# Patient Record
Sex: Male | Born: 1970 | Race: White | Hispanic: No | Marital: Single | Smoking: Current some day smoker
Health system: Southern US, Community
[De-identification: ages and names within clinical notes are randomized; demographics above are authoritative.]

## PROBLEM LIST (undated history)

## (undated) DIAGNOSIS — E785 Hyperlipidemia, unspecified: Secondary | ICD-10-CM

## (undated) DIAGNOSIS — E119 Type 2 diabetes mellitus without complications: Secondary | ICD-10-CM

## (undated) DIAGNOSIS — K2210 Ulcer of esophagus without bleeding: Secondary | ICD-10-CM

## (undated) DIAGNOSIS — F329 Major depressive disorder, single episode, unspecified: Secondary | ICD-10-CM

## (undated) DIAGNOSIS — F419 Anxiety disorder, unspecified: Secondary | ICD-10-CM

## (undated) DIAGNOSIS — K259 Gastric ulcer, unspecified as acute or chronic, without hemorrhage or perforation: Secondary | ICD-10-CM

## (undated) DIAGNOSIS — I10 Essential (primary) hypertension: Secondary | ICD-10-CM

## (undated) DIAGNOSIS — M1612 Unilateral primary osteoarthritis, left hip: Secondary | ICD-10-CM

## (undated) DIAGNOSIS — R7303 Prediabetes: Secondary | ICD-10-CM

## (undated) DIAGNOSIS — F32A Depression, unspecified: Secondary | ICD-10-CM

## (undated) DIAGNOSIS — R0600 Dyspnea, unspecified: Secondary | ICD-10-CM

## (undated) DIAGNOSIS — D689 Coagulation defect, unspecified: Secondary | ICD-10-CM

## (undated) DIAGNOSIS — Z9289 Personal history of other medical treatment: Secondary | ICD-10-CM

## (undated) DIAGNOSIS — K219 Gastro-esophageal reflux disease without esophagitis: Secondary | ICD-10-CM

## (undated) DIAGNOSIS — G4730 Sleep apnea, unspecified: Secondary | ICD-10-CM

## (undated) DIAGNOSIS — K254 Chronic or unspecified gastric ulcer with hemorrhage: Secondary | ICD-10-CM

## (undated) DIAGNOSIS — I639 Cerebral infarction, unspecified: Secondary | ICD-10-CM

## (undated) DIAGNOSIS — R351 Nocturia: Secondary | ICD-10-CM

## (undated) DIAGNOSIS — K644 Residual hemorrhoidal skin tags: Secondary | ICD-10-CM

## (undated) DIAGNOSIS — M2550 Pain in unspecified joint: Secondary | ICD-10-CM

## (undated) DIAGNOSIS — R42 Dizziness and giddiness: Secondary | ICD-10-CM

## (undated) DIAGNOSIS — K922 Gastrointestinal hemorrhage, unspecified: Secondary | ICD-10-CM

## (undated) DIAGNOSIS — J189 Pneumonia, unspecified organism: Secondary | ICD-10-CM

## (undated) DIAGNOSIS — K449 Diaphragmatic hernia without obstruction or gangrene: Secondary | ICD-10-CM

## (undated) DIAGNOSIS — K432 Incisional hernia without obstruction or gangrene: Secondary | ICD-10-CM

## (undated) DIAGNOSIS — Z86718 Personal history of other venous thrombosis and embolism: Secondary | ICD-10-CM

## (undated) DIAGNOSIS — K439 Ventral hernia without obstruction or gangrene: Secondary | ICD-10-CM

## (undated) DIAGNOSIS — K635 Polyp of colon: Secondary | ICD-10-CM

## (undated) DIAGNOSIS — K5900 Constipation, unspecified: Secondary | ICD-10-CM

## (undated) HISTORY — DX: Coagulation defect, unspecified: D68.9

## (undated) HISTORY — PX: UPPER GASTROINTESTINAL ENDOSCOPY: SHX188

## (undated) HISTORY — DX: Ventral hernia without obstruction or gangrene: K43.9

## (undated) HISTORY — DX: Hyperlipidemia, unspecified: E78.5

## (undated) HISTORY — DX: Essential (primary) hypertension: I10

## (undated) HISTORY — DX: Ulcer of esophagus without bleeding: K22.10

## (undated) HISTORY — DX: Diaphragmatic hernia without obstruction or gangrene: K44.9

## (undated) HISTORY — PX: POLYPECTOMY: SHX149

## (undated) HISTORY — PX: NISSEN FUNDOPLICATION: SHX2091

## (undated) HISTORY — DX: Sleep apnea, unspecified: G47.30

## (undated) HISTORY — DX: Polyp of colon: K63.5

## (undated) HISTORY — PX: COLONOSCOPY: SHX174

## (undated) HISTORY — PX: ESOPHAGOGASTRODUODENOSCOPY: SHX1529

---

## 1999-10-24 ENCOUNTER — Emergency Department (HOSPITAL_COMMUNITY): Admission: EM | Admit: 1999-10-24 | Discharge: 1999-10-24 | Payer: Self-pay | Admitting: Emergency Medicine

## 1999-10-25 ENCOUNTER — Encounter: Payer: Self-pay | Admitting: Emergency Medicine

## 2006-05-20 ENCOUNTER — Emergency Department: Payer: Self-pay | Admitting: Emergency Medicine

## 2010-02-02 DIAGNOSIS — Z9289 Personal history of other medical treatment: Secondary | ICD-10-CM

## 2010-02-02 DIAGNOSIS — Z86718 Personal history of other venous thrombosis and embolism: Secondary | ICD-10-CM

## 2010-02-02 HISTORY — DX: Personal history of other medical treatment: Z92.89

## 2010-02-02 HISTORY — DX: Personal history of other venous thrombosis and embolism: Z86.718

## 2010-09-28 ENCOUNTER — Inpatient Hospital Stay (HOSPITAL_COMMUNITY)
Admission: EM | Admit: 2010-09-28 | Discharge: 2010-10-01 | DRG: 378 | Disposition: A | Discharge status: Home / Self Care | Payer: Self-pay | Attending: Internal Medicine | Admitting: Internal Medicine

## 2010-09-28 ENCOUNTER — Encounter: Payer: Self-pay | Admitting: Internal Medicine

## 2010-09-28 ENCOUNTER — Emergency Department (HOSPITAL_COMMUNITY): Payer: Self-pay

## 2010-09-28 DIAGNOSIS — K2210 Ulcer of esophagus without bleeding: Secondary | ICD-10-CM | POA: Diagnosis present

## 2010-09-28 DIAGNOSIS — Z791 Long term (current) use of non-steroidal anti-inflammatories (NSAID): Secondary | ICD-10-CM

## 2010-09-28 DIAGNOSIS — R55 Syncope and collapse: Secondary | ICD-10-CM | POA: Diagnosis present

## 2010-09-28 DIAGNOSIS — K449 Diaphragmatic hernia without obstruction or gangrene: Secondary | ICD-10-CM | POA: Diagnosis present

## 2010-09-28 DIAGNOSIS — K254 Chronic or unspecified gastric ulcer with hemorrhage: Principal | ICD-10-CM | POA: Diagnosis present

## 2010-09-28 DIAGNOSIS — K922 Gastrointestinal hemorrhage, unspecified: Secondary | ICD-10-CM

## 2010-09-28 DIAGNOSIS — K219 Gastro-esophageal reflux disease without esophagitis: Secondary | ICD-10-CM | POA: Diagnosis present

## 2010-09-28 DIAGNOSIS — D500 Iron deficiency anemia secondary to blood loss (chronic): Secondary | ICD-10-CM | POA: Diagnosis present

## 2010-09-28 LAB — COMPREHENSIVE METABOLIC PANEL
ALT: 16 U/L (ref 0–53)
AST: 15 U/L (ref 0–37)
Albumin: 3 g/dL — ABNORMAL LOW (ref 3.5–5.2)
Alkaline Phosphatase: 31 U/L — ABNORMAL LOW (ref 39–117)
BUN: 25 mg/dL — ABNORMAL HIGH (ref 6–23)
CO2: 24 mEq/L (ref 19–32)
Calcium: 7.6 mg/dL — ABNORMAL LOW (ref 8.4–10.5)
Chloride: 110 mEq/L (ref 96–112)
Creatinine, Ser: 0.82 mg/dL (ref 0.50–1.35)
GFR calc Af Amer: >60 mL/min (ref >60)
GFR calc non Af Amer: >60 mL/min (ref >60)
Glucose, Bld: 92 mg/dL (ref 70–99)
Potassium: 4.4 mEq/L (ref 3.5–5.1)
Sodium: 139 mEq/L (ref 135–145)
Total Bilirubin: 0.1 mg/dL — ABNORMAL LOW (ref 0.3–1.2)
Total Protein: 4.9 g/dL — ABNORMAL LOW (ref 6.0–8.3)

## 2010-09-28 LAB — CK TOTAL AND CKMB (NOT AT ARMC)
CK, MB: 4.5 ng/mL — ABNORMAL HIGH (ref 0.3–4.0)
Relative Index: 2.3 (ref 0.0–2.5)
Total CK: 199 U/L (ref 7–232)

## 2010-09-28 LAB — PROTIME-INR
INR: 1.13 (ref 0.00–1.49)
Prothrombin Time: 14.7 seconds (ref 11.6–15.2)

## 2010-09-28 LAB — OCCULT BLOOD, POC DEVICE: Fecal Occult Bld: POSITIVE

## 2010-09-28 LAB — DIFFERENTIAL
Basophils Absolute: 0 10*3/uL (ref 0.0–0.1)
Basophils Relative: 0 % (ref 0–1)
Eosinophils Absolute: 0.1 10*3/uL (ref 0.0–0.7)
Eosinophils Relative: 1 % (ref 0–5)
Lymphocytes Relative: 12 % (ref 12–46)
Lymphs Abs: 1.9 10*3/uL (ref 0.7–4.0)
Monocytes Absolute: 0.6 10*3/uL (ref 0.1–1.0)
Monocytes Relative: 4 % (ref 3–12)
Neutro Abs: 13 10*3/uL — ABNORMAL HIGH (ref 1.7–7.7)
Neutrophils Relative %: 83 % — ABNORMAL HIGH (ref 43–77)

## 2010-09-28 LAB — POCT I-STAT, CHEM 8
BUN: 26 mg/dL — ABNORMAL HIGH (ref 6–23)
Calcium, Ion: 1.1 mmol/L — ABNORMAL LOW (ref 1.12–1.32)
Chloride: 110 mEq/L (ref 96–112)
Creatinine, Ser: 0.9 mg/dL (ref 0.50–1.35)
Glucose, Bld: 88 mg/dL (ref 70–99)
HCT: 26 % — ABNORMAL LOW (ref 39.0–52.0)
Hemoglobin: 8.8 g/dL — ABNORMAL LOW (ref 13.0–17.0)
Potassium: 4.3 mEq/L (ref 3.5–5.1)
Sodium: 141 mEq/L (ref 135–145)
TCO2: 22 mmol/L (ref 0–100)

## 2010-09-28 LAB — CBC
HCT: 26.8 % — ABNORMAL LOW (ref 39.0–52.0)
Hemoglobin: 9.1 g/dL — ABNORMAL LOW (ref 13.0–17.0)
MCH: 30.4 pg (ref 26.0–34.0)
MCHC: 34 g/dL (ref 30.0–36.0)
MCV: 89.6 fL (ref 78.0–100.0)
Platelets: 241 10*3/uL (ref 150–400)
RBC: 2.99 MIL/uL — ABNORMAL LOW (ref 4.22–5.81)
RDW: 13.2 % (ref 11.5–15.5)
WBC: 15.6 10*3/uL — ABNORMAL HIGH (ref 4.0–10.5)

## 2010-09-28 LAB — FOLATE: Folate: 15.5 ng/mL

## 2010-09-28 LAB — BASIC METABOLIC PANEL
Calcium: 7.7 mg/dL — ABNORMAL LOW (ref 8.4–10.5)
Creatinine, Ser: 0.79 mg/dL (ref 0.50–1.35)
GFR calc Af Amer: >60 mL/min (ref >60)
GFR calc non Af Amer: >60 mL/min (ref >60)
Sodium: 140 mEq/L (ref 135–145)

## 2010-09-28 LAB — IRON AND TIBC
Iron: 85 ug/dL (ref 42–135)
Saturation Ratios: 40 % (ref 20–55)
TIBC: 210 ug/dL — ABNORMAL LOW (ref 215–435)
UIBC: 125 ug/dL

## 2010-09-28 LAB — ABO/RH: ABO/RH(D): A POS

## 2010-09-28 LAB — VITAMIN B12: Vitamin B-12: 806 pg/mL (ref 211–911)

## 2010-09-28 LAB — FERRITIN: Ferritin: 44 ng/mL (ref 22–322)

## 2010-09-28 NOTE — H&P (Signed)
Hospital Admission Note Date: 09/28/2010  Patient name: [name] Medical record number: [id number] Date of birth: [birth year] Age: 40 y.o. Gender: male PCP: No PCP  Medical Service:  Medicine Teaching Service B2  Attending physician:   Dr. Blanch Media   Resident [phone]):   Dr. Bethel Born  Pager:  [phone] Resident (R1):   Dr. Blanca Friend  Pager:  [phone]  Chief Complaint: Hematemesis/Melena/Dizzyness  History of Present Illness: Mr. Bogdon is a 40 year old with a history of GERD s/p surgery for GERD (likely nissen fundoplication) who presents after experiencing SOB and lightheadedness last night, waking up weak and dizzy this morning and subsequently passing out on the couch, and hematemesis when he awoke from passing out as well as again in the ambulance coming to the ED.  He does report some melena yesterday as well.  When he was on the couch this morning his sister in law reports that he would "come in and out" of consciousness. Before late last night, he felt pretty much like his normal self, as he played in a softball game yesterday.  He has had chills and diaphoresis as well.  He has a history of GERD and underwent surgery for this (likely Nissen fundoplication) many years ago.  He still takes about 5 tums or rolaids per day.  He takes 400mg  ibuprofen about every other day for aches and pains.  No CP or abdominal pain, but his abdomen is tender he reports.  No headache, urinary symptoms, constipation, diarrhea, rash, recent weight loss, or fevers.    Meds: Ibuprofen 400mg  every other day on average for aches and pains Tums/Rolaids up to five per day  Allergies: NKDA   Past Medical History: GERD Joint Pains   Past Surgical History: "Esophageal flap for reflux" likely Nissen Fundoplication   Family History: Diabetes Mellitus, Heart Disease No MIs No cancer   Social History: Very little alcohol use socially No smoking No drug use. At baseline can walk  large distances easily.   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  Head: normocephalic and atraumatic.  Eyes: vision grossly intact, pupils equal, pupils round, pupils reactive to light, no injection and anicteric.  Mouth: pharynx pink and moist, no erythema, and no exudates.  Neck: supple, full ROM, no thyromegaly, no JVD, and no carotid bruits.  Lungs: normal respiratory effort, no accessory muscle use, normal breath sounds, no crackles, and no wheezes. Heart: normal rate, regular rhythm, no murmur, no gallop, and no rub.  Abdomen: soft, normal bowel sounds, no distention.  Moderate tenderness in central and lower quadrants.  No epigastric tenderness.  No guarding, no rebound tenderness. Msk: no joint swelling, no joint warmth, and no redness over joints.  Pulses: 2+ DP/PT pulses bilaterally Extremities: No cyanosis, clubbing, edema Neurologic: alert & oriented X3, cranial nerves II-XII intact, strength normal in all extremities, sensation intact to light touch.  Gait not tested due to dizzyness. Skin: turgor normal and no rashes.  Psych: Oriented X3, memory intact for recent and remote, normally interactive, good eye contact, mildly anxious appearing, and not depressed appearing.  Lab results: Basic Metabolic Panel: Recent Labs  [facility] 09/28/10 1420 09/28/10 1346   NA 141 139   K 4.3 4.4   CL 110 110   CO2 -- 24   GLUCOSE 88 92   BUN 26* 25*   CREATININE 0.90 0.82   CALCIUM -- 7.6*   MG -- --   PHOS -- --   Liver Function Tests: Recent Labs  [facility] 09/28/10 1346   AST 15   ALT 16   ALKPHOS 31*    BILITOT 0.1*   PROT 4.9*   ALBUMIN 3.0*   CBC: Recent Labs  [facility] 09/28/10 1420 09/28/10 1346   WBC -- 15.6*   NEUTROABS -- 13.0*   HGB 8.8* 9.1*   HCT 26.0* 26.8*   MCV -- 89.6   PLT -- 241     Imaging results:  Dg Chest Portable 1 View  09/28/2010  *RADIOLOGY REPORT*  Clinical Data: GI bleed.  PORTABLE CHEST - 1 VIEW  Comparison: No comparison studies available.  Findings: 1425 hours.  Lung volumes are low. The cardiopericardial silhouette is enlarged. The lungs are clear without focal infiltrate, edema, pneumothorax or pleural effusion. Imaged bony structures of the thorax are intact. Telemetry leads overlie the chest.  IMPRESSION: Low volume film without acute cardiopulmonary process.  Original Report Authenticated By: ERIC A. MANSELL, M.D.    Other results: EKG: Sinus rhythm.  Normal axis.  Possible Q wave in III, aVF.  So ST changes. No abnormal T wave inversions.    Assessment & Plan by Problem:  40 y/o M with h/o GERD s/p ?Nissen Fundoplication and nearly daily ibuprofen use comes to the ED for hemetemisis X2, melena for 2 days, and dizziness. He is hemoccult positive, has significant orthostasis and Hb of 9 (no baseline).   1. GI bleed: Seems to be upper GI bleed based on history. GERD and ibuprofen use points towards a Peptic ulcer disease. Vitals stable.  - 2 large bore IVs, telemetry monitoring, frequent vitals, CBC q4, Pt/INR, PTT  - IV fluids resuscitation with 1L bolus (in addition to 1 L given in ED) and NS at 200 cc/hr  - IV protonix 40 mg bid. Change to drip if Hb drops significantly or further episode of hemetemesis.  - Check for H-pylori serology  - GI consult for possible endoscopy to look for the source. Will keep on clear liquid diet for now and npo after midnight.  - will cycle cardiac enzymes and repeat EKG in AM to r/o ACS from blood loss.  - Transfuse with 1 unit PRBC.  Repeat transfusion if Hb <8  - morphine for arthritis pain.  - SCD for DVT PX      R2/3______________________________      R1________________________________  ATTENDING: I performed and/or observed a history and physical examination of the patient.  I discussed the case with the residents as noted and reviewed the residents' notes.  I agree with the findings and plan--please refer to the attending physician note for more details.  Signature________________________________  Printed Name_____________________________

## 2010-09-29 DIAGNOSIS — K922 Gastrointestinal hemorrhage, unspecified: Secondary | ICD-10-CM

## 2010-09-29 DIAGNOSIS — K254 Chronic or unspecified gastric ulcer with hemorrhage: Secondary | ICD-10-CM | POA: Insufficient documentation

## 2010-09-29 HISTORY — DX: Chronic or unspecified gastric ulcer with hemorrhage: K25.4

## 2010-09-29 LAB — CBC
HCT: 19.4 % — ABNORMAL LOW (ref 39.0–52.0)
HCT: 19.4 % — ABNORMAL LOW (ref 39.0–52.0)
HCT: 20 % — ABNORMAL LOW (ref 39.0–52.0)
Hemoglobin: 7.2 g/dL — ABNORMAL LOW (ref 13.0–17.0)
Hemoglobin: 6.7 g/dL — CL (ref 13.0–17.0)
Hemoglobin: 7 g/dL — ABNORMAL LOW (ref 13.0–17.0)
Hemoglobin: 7 g/dL — ABNORMAL LOW (ref 13.0–17.0)
MCH: 30.3 pg (ref 26.0–34.0)
MCH: 30.6 pg (ref 26.0–34.0)
MCH: 30.2 pg (ref 26.0–34.0)
MCH: 30.3 pg (ref 26.0–34.0)
MCHC: 34.1 g/dL (ref 30.0–36.0)
MCHC: 35.1 g/dL (ref 30.0–36.0)
MCHC: 35 g/dL (ref 30.0–36.0)
MCHC: 35 g/dL (ref 30.0–36.0)
MCV: 86.2 fL (ref 78.0–100.0)
MCV: 86.6 fL (ref 78.0–100.0)
MCV: 87.7 fL (ref 78.0–100.0)
MCV: 88.6 fL (ref 78.0–100.0)
MCV: 89 fL (ref 78.0–100.0)
Platelets: 204 10*3/uL (ref 150–400)
Platelets: 230 10*3/uL (ref 150–400)
Platelets: 236 10*3/uL (ref 150–400)
RBC: 2.19 MIL/uL — ABNORMAL LOW (ref 4.22–5.81)
RBC: 2.69 MIL/uL — ABNORMAL LOW (ref 4.22–5.81)
RDW: 14 % (ref 11.5–15.5)
RDW: 14.2 % (ref 11.5–15.5)
RDW: 14.3 % (ref 11.5–15.5)
RDW: 14.3 % (ref 11.5–15.5)
RDW: 15.2 % (ref 11.5–15.5)
WBC: 10 10*3/uL (ref 4.0–10.5)
WBC: 13.5 10*3/uL — ABNORMAL HIGH (ref 4.0–10.5)

## 2010-09-29 LAB — CARDIAC PANEL(CRET KIN+CKTOT+MB+TROPI)
Total CK: 176 U/L (ref 7–232)
Troponin I: <0.3 ng/mL (ref <0.30)

## 2010-09-29 LAB — GLUCOSE, CAPILLARY: Glucose-Capillary: 124 mg/dL — ABNORMAL HIGH (ref 70–99)

## 2010-09-30 LAB — CBC
HCT: 22.4 % — ABNORMAL LOW (ref 39.0–52.0)
HCT: 21.4 % — ABNORMAL LOW (ref 39.0–52.0)
HCT: 22.9 % — ABNORMAL LOW (ref 39.0–52.0)
Hemoglobin: 7.9 g/dL — ABNORMAL LOW (ref 13.0–17.0)
Hemoglobin: 7.9 g/dL — ABNORMAL LOW (ref 13.0–17.0)
MCH: 30.1 pg (ref 26.0–34.0)
MCH: 30.4 pg (ref 26.0–34.0)
MCH: 30.5 pg (ref 26.0–34.0)
MCHC: 34.9 g/dL (ref 30.0–36.0)
MCHC: 35.3 g/dL (ref 30.0–36.0)
MCHC: 35.6 g/dL (ref 30.0–36.0)
MCV: 86.5 fL (ref 78.0–100.0)
MCV: 85.9 fL (ref 78.0–100.0)
Platelets: 175 10*3/uL (ref 150–400)
Platelets: 183 10*3/uL (ref 150–400)
RBC: 2.59 MIL/uL — ABNORMAL LOW (ref 4.22–5.81)
RBC: 2.49 MIL/uL — ABNORMAL LOW (ref 4.22–5.81)
RDW: 14.9 % (ref 11.5–15.5)
RDW: 14.9 % (ref 11.5–15.5)
RDW: 15 % (ref 11.5–15.5)
WBC: 10.4 10*3/uL (ref 4.0–10.5)
WBC: 8.5 10*3/uL (ref 4.0–10.5)

## 2010-09-30 LAB — BASIC METABOLIC PANEL
BUN: 12 mg/dL (ref 6–23)
CO2: 25 mEq/L (ref 19–32)
Calcium: 7.9 mg/dL — ABNORMAL LOW (ref 8.4–10.5)
Chloride: 113 mEq/L — ABNORMAL HIGH (ref 96–112)
Creatinine, Ser: 0.77 mg/dL (ref 0.50–1.35)
GFR calc non Af Amer: >60 mL/min (ref >60)
Glucose, Bld: 92 mg/dL (ref 70–99)
Sodium: 141 mEq/L (ref 135–145)

## 2010-10-01 LAB — CBC
MCH: 30 pg (ref 26.0–34.0)
MCHC: 34.4 g/dL (ref 30.0–36.0)
Platelets: 205 10*3/uL (ref 150–400)
RBC: 2.47 MIL/uL — ABNORMAL LOW (ref 4.22–5.81)

## 2010-10-01 LAB — BASIC METABOLIC PANEL
CO2: 28 mEq/L (ref 19–32)
Calcium: 8 mg/dL — ABNORMAL LOW (ref 8.4–10.5)
Chloride: 109 mEq/L (ref 96–112)
GFR calc Af Amer: >60 mL/min (ref >60)
GFR calc non Af Amer: >60 mL/min (ref >60)
Glucose, Bld: 137 mg/dL — ABNORMAL HIGH (ref 70–99)
Potassium: 3.5 mEq/L (ref 3.5–5.1)
Sodium: 140 mEq/L (ref 135–145)

## 2010-10-02 ENCOUNTER — Inpatient Hospital Stay (HOSPITAL_COMMUNITY)
Admission: EM | Admit: 2010-10-02 | Discharge: 2010-10-11 | DRG: 327 | Disposition: A | Discharge status: Home / Self Care | Payer: Self-pay | Attending: Internal Medicine | Admitting: Internal Medicine

## 2010-10-02 ENCOUNTER — Encounter: Payer: Self-pay | Admitting: Internal Medicine

## 2010-10-02 DIAGNOSIS — E876 Hypokalemia: Secondary | ICD-10-CM | POA: Diagnosis present

## 2010-10-02 DIAGNOSIS — L270 Generalized skin eruption due to drugs and medicaments taken internally: Secondary | ICD-10-CM | POA: Diagnosis present

## 2010-10-02 DIAGNOSIS — K219 Gastro-esophageal reflux disease without esophagitis: Secondary | ICD-10-CM | POA: Diagnosis present

## 2010-10-02 DIAGNOSIS — K922 Gastrointestinal hemorrhage, unspecified: Secondary | ICD-10-CM

## 2010-10-02 DIAGNOSIS — Z791 Long term (current) use of non-steroidal anti-inflammatories (NSAID): Secondary | ICD-10-CM

## 2010-10-02 DIAGNOSIS — M1990 Unspecified osteoarthritis, unspecified site: Secondary | ICD-10-CM | POA: Diagnosis present

## 2010-10-02 DIAGNOSIS — T40605A Adverse effect of unspecified narcotics, initial encounter: Secondary | ICD-10-CM | POA: Diagnosis present

## 2010-10-02 DIAGNOSIS — D62 Acute posthemorrhagic anemia: Secondary | ICD-10-CM | POA: Diagnosis present

## 2010-10-02 DIAGNOSIS — K254 Chronic or unspecified gastric ulcer with hemorrhage: Principal | ICD-10-CM | POA: Diagnosis present

## 2010-10-02 LAB — CBC
HCT: 23.6 % — ABNORMAL LOW (ref 39.0–52.0)
Hemoglobin: 8.2 g/dL — ABNORMAL LOW (ref 13.0–17.0)
MCH: 30.4 pg (ref 26.0–34.0)
MCHC: 33.9 g/dL (ref 30.0–36.0)
MCV: 89.7 fL (ref 78.0–100.0)
Platelets: 285 10*3/uL (ref 150–400)
RBC: 2.72 MIL/uL — ABNORMAL LOW (ref 4.22–5.81)
RDW: 15.1 % (ref 11.5–15.5)
WBC: 13.2 10*3/uL — ABNORMAL HIGH (ref 4.0–10.5)

## 2010-10-02 LAB — TYPE AND SCREEN
ABO/RH(D): A POS
Antibody Screen: NEGATIVE
Unit division: 0
Unit division: 0
Unit division: 0

## 2010-10-02 LAB — PROTIME-INR
INR: 1.01 (ref 0.00–1.49)
Prothrombin Time: 13.5 seconds (ref 11.6–15.2)

## 2010-10-02 LAB — BASIC METABOLIC PANEL
Calcium: 8.9 mg/dL (ref 8.4–10.5)
Creatinine, Ser: 0.69 mg/dL (ref 0.50–1.35)
GFR calc non Af Amer: >60 mL/min (ref >60)
Sodium: 140 mEq/L (ref 135–145)

## 2010-10-02 LAB — DIFFERENTIAL
Basophils Absolute: 0 10*3/uL (ref 0.0–0.1)
Basophils Relative: 0 % (ref 0–1)
Neutro Abs: 10.7 10*3/uL — ABNORMAL HIGH (ref 1.7–7.7)
Neutrophils Relative %: 81 % — ABNORMAL HIGH (ref 43–77)

## 2010-10-02 LAB — OCCULT BLOOD, POC DEVICE: Fecal Occult Bld: POSITIVE

## 2010-10-02 NOTE — H&P (Signed)
Hospital Admission Note  Date: 10/02/2010  Patient name: [name] Medical record number: [id number]  Date of birth: 05-19-1970 Age: 40 y.o. Gender: male  PCP: Dr. Bethel Born Medical Service: Medicine Teaching Service B2   Attending physician: Dr. Blanch Media   Resident [phone]): Dr. Bethel Born Pager: [phone]   Resident (R1): Dr. Blanca Friend Pager: [phone]   Chief Complaint: Hematemesis/Melena/Dizzyness   History of Present Illness:  [name] is a 40 year old with a history of GERD s/p surgery for GERD (possible nissen fundoplication?) ~20 years ago who was admitted to our service on 09/28/10 and found to have a bleeding gastric ulcer on endoscopy.  Prior to endoscopy his hgb had been falling and he required a total of five units PRBCs during the admission in addition to a lot of IVF.  GI injected the ulcer with epinephrine and clipped the ulcer, and his Hgb stabilized.  He did well and was discharged yesterday.  His Hgb was 7.5 on discharge.  He reported that his melena resolved before discharge.  He returned to the ED today after two episodes of melena at home.  The second was this morning, following which he experienced diaphoresis and felt very lightheaded.  He had similar symptoms before his first admission, and he is quite concerned that he may be rebleeding.  No hematemesis.  No bright red blood in his stools.  No fevers or chills, no CP, no SOB, no headache, no urinary symptoms, no constipation, no diarrhea, and no rash.  He has had a mild increase in his lower abdominal pain present during last admission today as well.   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  Head: normocephalic and atraumatic.  Eyes: vision grossly intact, pupils equal, pupils round, pupils reactive to light, no injection and anicteric.  Mouth: pharynx pink and moist, no erythema, and no exudates.  Neck: supple, full ROM, no thyromegaly, no JVD, and no carotid bruits.  Lungs: normal respiratory effort, no accessory muscle use, normal breath sounds, no crackles, and no wheezes. Heart: normal rate, regular rhythm, no murmur, no gallop, and no rub.  Abdomen: soft, normal bowel sounds, no distention. Moderate tenderness in central and lower quadrants to light/medium palpation. Moderate epigastric tenderness to deep palpation only. No guarding, no rebound tenderness. Msk: no joint swelling, no joint warmth, and no redness over joints.  Pulses: 2+ DP/PT pulses bilaterally Extremities: No cyanosis, clubbing, edema Neurologic: alert & oriented X3, cranial nerves II-XII intact, strength normal in all extremities, sensation intact to light touch. Gait not tested  due to dizzyness. Skin: turgor normal and no rashes.  Psych: Oriented X3, memory intact for recent and remote, normally interactive, good eye contact, mildly anxious appearing, and not depressed appearing.   Lab results:   BMET    Component Value Date/Time   NA 140 10/02/2010 1240   K 4.1 10/02/2010 1240   CL 107 10/02/2010 1240   CO2 25 10/02/2010 1240   GLUCOSE 109* 10/02/2010 1240   BUN 10 10/02/2010 1240   CREATININE 0.69 10/02/2010 1240   CALCIUM 8.9 10/02/2010  1240   GFRNONAA >60 10/02/2010 1240   GFRAA >60 10/02/2010 1240   CBC:     Component Value Date/Time   WBC 13.2* 10/02/2010 1240   RBC 2.72* 10/02/2010 1240   HGB 8.2* 10/02/2010 1240   HCT 24.2* 10/02/2010 1240   PLT 231 10/02/2010 1240   MCV 89.0 10/02/2010 1240   MCH 30.1 10/02/2010 1240   MCHC 33.9 10/02/2010 1240   RDW 15.4 10/02/2010 1240   LYMPHSABS 1.6 10/02/2010 1240   MONOABS 0.8 10/02/2010 1240   EOSABS 0.2 10/02/2010 1240   BASOSABS 0.0 10/02/2010 1240   PT: 13.5 INR: 1.01 PTT: 27  FOBT: Positive   Assessment & Plan by Problem:  40 y/o M with h/o GERD and hiatal hernia s/p "esophageal wrap" surgery now likely failed who was admitted on 09/28/10 for a bleeding gastric ulcer likely caused by nearly daily ibuprofen use who comes back to the ED for melena since discharge after melena had resolved before discharge, diaphoresis, and dizziness. He is mildly orthostatic in the ED and is very concerned.  1. GI bleed: It is most likely that he has not begun bleeding again given no Hgb drop, no hematemesis, and no hematochezia.  Some melena is to be expected in the days following a GI bleed.  However, his history of melena restarting after stopping and increased dizzyness/diaphoresis are concerning.  His symptoms are probably a combination of his low hemoglobin and anxiety, but we will admit him to make sure he is not still bleeding.  - 2 large bore IVs, telemetry monitoring, CBC BID - Fall precautions - IV fluids resuscitation with 2L bolus and NS at 150 cc/hr  - IV protonix 40 mg bid. Change to drip if Hb drops significantly or he has hematemesis  - Will keep on clear liquid diet for now - 12 lead EKG - Type and Screen 4 units, transfuse with 1 unit PRBC. Repeat transfusion if Hb <8 since he is symptomatic on admission.  - Tylenol PRN for arthritis pain.  - Iron 325mg  PO BID - SCD for DVT PX     R2/3______________________________    R1________________________________     ATTENDING: I performed and/or observed a history and physical examination of the patient. I discussed the case with the residents as noted and reviewed the residents' notes. I agree with the findings and plan--please refer to the attending physician note for more details.    Signature________________________________    Printed Name_____________________________

## 2010-10-03 DIAGNOSIS — K922 Gastrointestinal hemorrhage, unspecified: Secondary | ICD-10-CM

## 2010-10-03 LAB — CBC
HCT: 23 % — ABNORMAL LOW (ref 39.0–52.0)
HCT: 25.8 % — ABNORMAL LOW (ref 39.0–52.0)
Hemoglobin: 7.4 g/dL — ABNORMAL LOW (ref 13.0–17.0)
Hemoglobin: 8.9 g/dL — ABNORMAL LOW (ref 13.0–17.0)
MCH: 30.5 pg (ref 26.0–34.0)
MCH: 30.7 pg (ref 26.0–34.0)
MCHC: 33.5 g/dL (ref 30.0–36.0)
MCHC: 33.5 g/dL (ref 30.0–36.0)
MCHC: 34.5 g/dL (ref 30.0–36.0)
Platelets: 283 10*3/uL (ref 150–400)
Platelets: 288 10*3/uL (ref 150–400)
RBC: 2.43 MIL/uL — ABNORMAL LOW (ref 4.22–5.81)
RBC: 2.9 MIL/uL — ABNORMAL LOW (ref 4.22–5.81)
RDW: 15.6 % — ABNORMAL HIGH (ref 11.5–15.5)
RDW: 15.6 % — ABNORMAL HIGH (ref 11.5–15.5)
WBC: 8.9 10*3/uL (ref 4.0–10.5)
WBC: 11.3 10*3/uL — ABNORMAL HIGH (ref 4.0–10.5)

## 2010-10-03 LAB — BASIC METABOLIC PANEL
BUN: 5 mg/dL — ABNORMAL LOW (ref 6–23)
Calcium: 8.5 mg/dL (ref 8.4–10.5)
Creatinine, Ser: 0.72 mg/dL (ref 0.50–1.35)
GFR calc Af Amer: >60 mL/min (ref >60)

## 2010-10-03 LAB — PREPARE RBC (CROSSMATCH)

## 2010-10-04 ENCOUNTER — Other Ambulatory Visit: Payer: Self-pay | Admitting: Gastroenterology

## 2010-10-04 ENCOUNTER — Inpatient Hospital Stay (HOSPITAL_COMMUNITY): Payer: Self-pay

## 2010-10-04 DIAGNOSIS — K250 Acute gastric ulcer with hemorrhage: Secondary | ICD-10-CM

## 2010-10-04 DIAGNOSIS — K922 Gastrointestinal hemorrhage, unspecified: Secondary | ICD-10-CM

## 2010-10-04 DIAGNOSIS — R109 Unspecified abdominal pain: Secondary | ICD-10-CM

## 2010-10-04 DIAGNOSIS — D62 Acute posthemorrhagic anemia: Secondary | ICD-10-CM

## 2010-10-04 LAB — BASIC METABOLIC PANEL
BUN: 13 mg/dL (ref 6–23)
CO2: 24 mEq/L (ref 19–32)
Chloride: 107 mEq/L (ref 96–112)
Creatinine, Ser: 0.73 mg/dL (ref 0.50–1.35)
GFR calc Af Amer: >60 mL/min (ref >60)
Glucose, Bld: 96 mg/dL (ref 70–99)

## 2010-10-04 LAB — POCT I-STAT 4, (NA,K, GLUC, HGB,HCT)
Glucose, Bld: 92 mg/dL (ref 70–99)
HCT: 23 % — ABNORMAL LOW (ref 39.0–52.0)
Hemoglobin: 7.8 g/dL — ABNORMAL LOW (ref 13.0–17.0)
Potassium: 4.2 mEq/L (ref 3.5–5.1)
Sodium: 139 mEq/L (ref 135–145)

## 2010-10-04 LAB — HEMOGLOBIN AND HEMATOCRIT, BLOOD
HCT: 25.2 % — ABNORMAL LOW (ref 39.0–52.0)
Hemoglobin: 8.7 g/dL — ABNORMAL LOW (ref 13.0–17.0)

## 2010-10-04 LAB — CBC
HCT: 28.9 % — ABNORMAL LOW (ref 39.0–52.0)
Hemoglobin: 10.2 g/dL — ABNORMAL LOW (ref 13.0–17.0)
MCHC: 35.3 g/dL (ref 30.0–36.0)
MCV: 87 fL (ref 78.0–100.0)
Platelets: 257 10*3/uL (ref 150–400)
RDW: 15.7 % — ABNORMAL HIGH (ref 11.5–15.5)
WBC: 12.6 10*3/uL — ABNORMAL HIGH (ref 4.0–10.5)

## 2010-10-04 LAB — APTT: aPTT: 20 seconds — ABNORMAL LOW (ref 24–37)

## 2010-10-04 MED ORDER — IOHEXOL 300 MG/ML  SOLN
300.0000 mL | Freq: Once | INTRAMUSCULAR | Status: AC | PRN
  Administered 2010-10-04: 140 mL via INTRA_ARTERIAL

## 2010-10-05 HISTORY — PX: STOMACH SURGERY: SHX791

## 2010-10-05 LAB — TYPE AND SCREEN
ABO/RH(D): A POS
Antibody Screen: NEGATIVE
Unit division: 0
Unit division: 0
Unit division: 0
Unit division: 0
Unit division: 0

## 2010-10-05 LAB — COMPREHENSIVE METABOLIC PANEL
ALT: 39 U/L (ref 0–53)
Albumin: 2.4 g/dL — ABNORMAL LOW (ref 3.5–5.2)
Alkaline Phosphatase: 25 U/L — ABNORMAL LOW (ref 39–117)
BUN: 12 mg/dL (ref 6–23)
Chloride: 107 mEq/L (ref 96–112)
GFR calc Af Amer: >60 mL/min (ref >60)
Glucose, Bld: 96 mg/dL (ref 70–99)
Potassium: 4 mEq/L (ref 3.5–5.1)
Sodium: 138 mEq/L (ref 135–145)
Total Bilirubin: 0.5 mg/dL (ref 0.3–1.2)
Total Protein: 4.5 g/dL — ABNORMAL LOW (ref 6.0–8.3)

## 2010-10-05 LAB — HEMOGLOBIN AND HEMATOCRIT, BLOOD
HCT: 30.3 % — ABNORMAL LOW (ref 39.0–52.0)
Hemoglobin: 10.4 g/dL — ABNORMAL LOW (ref 13.0–17.0)
Hemoglobin: 9.9 g/dL — ABNORMAL LOW (ref 13.0–17.0)

## 2010-10-05 LAB — CBC
HCT: 28.8 % — ABNORMAL LOW (ref 39.0–52.0)
Hemoglobin: 9.9 g/dL — ABNORMAL LOW (ref 13.0–17.0)
RBC: 3.3 MIL/uL — ABNORMAL LOW (ref 4.22–5.81)
WBC: 19.1 10*3/uL — ABNORMAL HIGH (ref 4.0–10.5)

## 2010-10-06 LAB — CBC
Hemoglobin: 9.8 g/dL — ABNORMAL LOW (ref 13.0–17.0)
MCH: 30.3 pg (ref 26.0–34.0)
MCHC: 34.4 g/dL (ref 30.0–36.0)
MCV: 88.2 fL (ref 78.0–100.0)
RBC: 3.23 MIL/uL — ABNORMAL LOW (ref 4.22–5.81)

## 2010-10-06 LAB — BASIC METABOLIC PANEL
CO2: 23 mEq/L (ref 19–32)
Calcium: 8 mg/dL — ABNORMAL LOW (ref 8.4–10.5)
Creatinine, Ser: 0.79 mg/dL (ref 0.50–1.35)
GFR calc non Af Amer: >60 mL/min (ref >60)
Glucose, Bld: 79 mg/dL (ref 70–99)
Sodium: 137 mEq/L (ref 135–145)

## 2010-10-06 NOTE — Op Note (Signed)
NAMEKRISTIN, LAMAGNA NO.:  [id number]  MEDICAL RECORD NO.:  [id number]  LOCATION:  2605                         FACILITY:  [facility]  PHYSICIAN:  Maisie Fus A. Rico Massar, M.D.DATE OF BIRTH:  1970-09-04  DATE OF PROCEDURE:  10/04/2010 DATE OF DISCHARGE:                              OPERATIVE REPORT   PREOPERATIVE DIAGNOSIS:  Bleeding from gastric linear ulcer in anterior anteromedial gastric wall, body/fundus of the stomach.  POSTOPERATIVE DIAGNOSIS:  Bleeding from gastric linear ulcer in anterior anteromedial gastric wall, body, fundus of the stomach.  PROCEDURE:  Exploratory laparotomy with oversewing of linear gastric ulcer and biopsy of linear gastric ulcer in the body/fundus of the stomach.  SURGEON:  Maisie Fus A. Sherlonda Flater, MD  ASSISTANT:  Mary Sella. Andrey Campanile, MD  ANESTHESIA:  General endotracheal anesthesia.  ESTIMATED BLOOD LOSS:  100 mL with 500 mL of old clotted blood removed from the stomach.  DRAINS:  None.  INDICATIONS FOR PROCEDURE:  The patient is a 40 year old male with gastric bleeding.  He has had a history of excessive NSAID use.  He has a history of Nissen fundoplication.  He has been managed by endoscopy with 2 attempts to try to control bleeding with clips with failure of both attempts.  He is hemodynamically stable, but has been having issues with little bit of hypotension today.  I discussed options of surgical exploration with the patient versus continuing medical management. After discussion of both, pros and cons of each, potential risks of bleeding and infection, poor gastric healing, perforation of stomach, leakage from stomach, possible gastrectomy, possible having have repeat surgery if cancer is identified in the biopsy were discussed with the patient.  Alternative therapies of continued endoscopic management, potential re-embolization, or other options but I did not feel these to be successful in the setting, I discussed that with the  patient.  He understood the above and agreed to proceed.  Risks of bleeding, infection, death, pulmonary complications, cardiovascular complications, wound complications, hernia, organ injury, injury to other neighboring structures requiring further surgery, sepsis, death, DVT are all potential issues here and also the risks of rebleeding.  After the above discussion, he agree to proceed.  DESCRIPTION OF PROCEDURE:  The patient was brought to the operating room and placed supine.  After induction of general anesthesia, the abdomen was prepped and draped in a sterile fashion.  Upper midline incision was used go gain to access into the abdominal cavity without difficulty. Upon entering, there was significant intraluminal blood.  A Bookwalter retractor was used.  After placement of the retractor, I was able to identify the stomach.  I could see where the clips were placed.  It was felt to be in the anteromedial gastric wall.  This was probably in the fundus or body of the stomach.  Gastrotomy was made.  500 mL of old clotted blood was removed.  The clips were easily then seen.  There was no active bleeding at this point in time.  I removed the clips and oversewed this linear ulcer with 3-0 Vicryl.  Also, I biopsied the linear ulcer.  Multiple small sections of the ulcer were taken circumferentially.  This appeared to be  quite superficial and appeared to be more nonsteroidal antiinflammatory drug-induced.  There was no real mass to it.  This did not appear to be a Dieulafoy lesion.  We then went back and irrigate out the stomach.  We watched the stomach for the next 10-15 minutes and saw no bleeding from the stomach.  We then closed the stomach anterior wall in 2 layers with a running 3-0 PDS and 3-0 Vicryl on the second layer.  NG tube was in good position.  Excess irrigation was suctioned out.  I did a sponge, needle, and instrument count which was correct.  We then closed the fascia with  #1 running PDS. Staples were used to close the skin.  Dry dressings were applied.  All final counts of sponge, needle, and was were found to be correct at this portion of case.  The patient was awoke, extubated, and taken to recovery in satisfactory condition.     Tiburcio Linder A. Mukesh Kornegay, M.D.     TAC/MEDQ  D:  10/04/2010  T:  10/05/2010  Job:  [id number]  cc:   Graylin Shiver, M.D. Shirley Friar, MD  Electronically Signed by Harriette Bouillon M.D. on 10/06/2010 11:02:01 AM

## 2010-10-07 LAB — CBC
MCH: 30 pg (ref 26.0–34.0)
MCHC: 33.7 g/dL (ref 30.0–36.0)
MCV: 89 fL (ref 78.0–100.0)
Platelets: 248 10*3/uL (ref 150–400)
RBC: 3.1 MIL/uL — ABNORMAL LOW (ref 4.22–5.81)
RDW: 14.9 % (ref 11.5–15.5)

## 2010-10-07 LAB — COMPREHENSIVE METABOLIC PANEL
ALT: 47 U/L (ref 0–53)
AST: 33 U/L (ref 0–37)
Albumin: 2.3 g/dL — ABNORMAL LOW (ref 3.5–5.2)
Calcium: 7.9 mg/dL — ABNORMAL LOW (ref 8.4–10.5)
Creatinine, Ser: 0.68 mg/dL (ref 0.50–1.35)
GFR calc non Af Amer: >60 mL/min (ref >60)
Sodium: 136 mEq/L (ref 135–145)
Total Protein: 5 g/dL — ABNORMAL LOW (ref 6.0–8.3)

## 2010-10-08 DIAGNOSIS — K922 Gastrointestinal hemorrhage, unspecified: Secondary | ICD-10-CM

## 2010-10-08 LAB — CBC
HCT: 25.8 % — ABNORMAL LOW (ref 39.0–52.0)
Hemoglobin: 8.7 g/dL — ABNORMAL LOW (ref 13.0–17.0)
MCH: 29.9 pg (ref 26.0–34.0)
MCV: 88.7 fL (ref 78.0–100.0)
Platelets: 308 10*3/uL (ref 150–400)
RBC: 2.91 MIL/uL — ABNORMAL LOW (ref 4.22–5.81)
RDW: 14.5 % (ref 11.5–15.5)

## 2010-10-08 LAB — BASIC METABOLIC PANEL
BUN: 4 mg/dL — ABNORMAL LOW (ref 6–23)
BUN: 3 mg/dL — ABNORMAL LOW (ref 6–23)
CO2: 28 mEq/L (ref 19–32)
Calcium: 8.2 mg/dL — ABNORMAL LOW (ref 8.4–10.5)
Chloride: 103 mEq/L (ref 96–112)
Chloride: 101 mEq/L (ref 96–112)
Creatinine, Ser: 0.64 mg/dL (ref 0.50–1.35)
Creatinine, Ser: 0.65 mg/dL (ref 0.50–1.35)
GFR calc Af Amer: >60 mL/min (ref >60)
GFR calc Af Amer: >60 mL/min (ref >60)
GFR calc non Af Amer: >60 mL/min (ref >60)
GFR calc non Af Amer: >60 mL/min (ref >60)
Glucose, Bld: 117 mg/dL — ABNORMAL HIGH (ref 70–99)
Potassium: 3.3 mEq/L — ABNORMAL LOW (ref 3.5–5.1)
Potassium: 3.4 mEq/L — ABNORMAL LOW (ref 3.5–5.1)
Sodium: 137 mEq/L (ref 135–145)

## 2010-10-10 LAB — BASIC METABOLIC PANEL
CO2: 26 mEq/L (ref 19–32)
Chloride: 108 mEq/L (ref 96–112)
Creatinine, Ser: 0.68 mg/dL (ref 0.50–1.35)
GFR calc Af Amer: >60 mL/min (ref >60)
Potassium: 4 mEq/L (ref 3.5–5.1)
Sodium: 141 mEq/L (ref 135–145)

## 2010-10-10 LAB — CBC
MCV: 88.6 fL (ref 78.0–100.0)
Platelets: 416 10*3/uL — ABNORMAL HIGH (ref 150–400)
RBC: 2.97 MIL/uL — ABNORMAL LOW (ref 4.22–5.81)
WBC: 7.4 10*3/uL (ref 4.0–10.5)

## 2010-10-10 NOTE — Discharge Summary (Signed)
NAMEKMARION, RAWL NO.:  [id number]  MEDICAL RECORD NO.:  [id number]  LOCATION:  2503                         FACILITY:  [facility]  PHYSICIAN:  Blanch Media, M.D.DATE OF BIRTH:  [birth year]  DATE OF ADMISSION:  09/28/2010 DATE OF DISCHARGE:  10/01/2010                              DISCHARGE SUMMARY   DISCHARGE DIAGNOSES: 1. Upper gastrointestinal bleed:  Due to gastric peptic ulcer.  Most     likely due to frequent NSAID use.  Also in the setting of     gastroesophageal reflux disease. 2. Gastroesophageal reflux disease. 3. Likely Iron-deficiency anemia.  DISCHARGE MEDICATIONS: 1. Omeprazole 20 mg by mouth twice per day. 2. Ferrous sulfate 3-25 mg by mouth twice per day. 3. Tylenol [phone] mg by mouth every 6 hours as needed for pain.  DISPOSITION:  The patient is scheduled to [facility] on September 11 with Dr. Bethel Born.  At this time, his proton pump inhibitor should be addressed and should be confirmed that he had indeed been taking this twice per day and plan to continue taking this.  Also his anemia should be addressed at this time.  During this hospitalization the patient was found and ferritin of 44.  It is difficult to tell whether this patient has a baseline anemia, as he presented with a upper GI bleed but possible that he has an iron deficiency anemia.  PROCEDURES: 1. Chest x-ray on September 28, 2010:  No acute cardiopulmonary process.     Low-volume film. 2. Upper gastrointestinal endoscopy:  The patient underwent upper GI     endoscopy during this admission by Dr. Evette Cristal, Gastroenterology.     Dr. Evette Cristal, found a 5 cm hiatal hernia as well as a ulcer in the     herniated part of the stomach. This ulcer was clipped and injected     with epinephrine.  CONSULTATIONS:  Gastroenterology.  ADMISSION HISTORY:  Mr. [name] is a 40 year old with a history of GERD, status post surgery for GERD (likely  Nissen fundoplication) who presents after experiencing shortness of breath and lightheadedness last night, waking up weak and dizzy this morning and subsequently passing out on the couch, and hematemesis when he awoke from passing out as well as again in the ambulance coming to the ED.  He does report some melena yesterday as well.  When he was on the couch this morning and sister-in- law reports he would come in and out of consciousness.  Before last night he felt pretty much like his normal self as he played a softball game yesterday.  He has had chills and diaphoresis as well.  He has a history of GERD and underwent surgery for that, a likely Nissen fundoplication, many years ago.  He still takes about 5 tums or rolaids per day.  He takes about 400 mg of ibuprofen nearly daily for aches and pains.  No chest pain or abdominal pain, but his abdomen is tender he reports. No headache.  No fevers.  PHYSICAL EXAMINATION ON ADMISSION:  VITAL SIGNS:  Temperature 98.7, pulse 76, blood pressure 110/66, respirations 20, and O2 saturation 100 on room air.  It was noted that the patient was orthostatic on admission. GENERAL:  Well developed, alert, cooperative to examination. HEAD:  Normocephalic and atraumatic. EYES:  Vision grossly intact.  Pupils equally round and reactive to light.  No injection and anicteric. MOUTH:  Pharynx pink and moist.  No erythema, no exudates. NECK:  Supple.  Full range of motion.  No thyromegaly, no JVD, no carotid bruits. LUNGS:  Clear to auscultation bilaterally. HEART:  Regular rate and rhythm.  No murmurs, rubs, or gallops. ABDOMEN:  Soft, normal bowel sounds.  No distention.  Moderate tenderness in central lower quadrants.  No epigastric tenderness.  No guarding, no rebound tenderness. PULSES:  2+ peripheral pulses bilaterally. EXTREMITIES:  No edema.  No clubbing, no cyanosis. NEUROLOGIC:  Alert and oriented x3.  Cranial nerves II through XII are grossly  intact.  Strength normal in all extremities and sensation is intact to light touch. SKIN:  Turgor normal.  No rashes.  ADMISSION LAB RESULTS: 1. BMET:  Sodium 139, potassium 4.4, chloride 110, bicarb 24, glucose     92, BUN 25, creatinine 0.82, and calcium 7.6. 2. Liver function tests: AST 15, ALT 16, alkaline phosphatase 31, total     bilirubin 0.1, protein 4.9, and albumin 3.0. 3. CBC: white blood cells 15.6, absolute neutrophils 13.0, hemoglobin     9.1, hematocrit 26.8, MCV 89.6, and platelets 241.  HOSPITAL COURSE BY PROBLEM: 1. GI bleed:  This was found on endoscopy to be due to a peptic ulcer.     The patient's peptic ulcer disease is likely due to his frequent     NSAID use.  The patient was tested for H. pylori serum antibody     which was negative.  On admission, the patient's hemoglobin was     9.1.  This subsequently decreased to 6.8 the following morning at     which point the patient was sent to endoscopy.  During this     admission, the patient received total of 5 units of packed red     blood cells.  On discharge, his hemoglobin was stable and was 7.5.     The patient noted that his melena had likely resolved by discharge     and he was still mildly symptomatic walking around in terms of     feeling dizzy.  He was sent home to live as he had been living with     his brother's family.  On discharge, the patient is to continue     taking twice daily proton pump inhibitor indefinitely. 2. Likely iron deficiency anemia:  Anemia panel was performed during     this admission which showed an iron of 85, total iron binding     capacity of 210, and a ferritin of 44.  Vitamin B12 and folate were     noted to be normal.  This low ferritin indicates likely iron     deficiency.  Since the patient had not been seen at our hospital     previously we have no baseline hemoglobin for him as he presented     with likely already existent GI bleed.  His hemoglobin 9.1 on     admission was  likely below his baseline.  It is possible     given his iron findings that he does have an iron-deficiency     anemia, this should be addressed on followup with his new PCP, Dr.     Scot Dock.  The patient is to commenced taking  ferrous sulfate 325 mg     by mouth twice daily on discharge per his iron deficiency.  DISCHARGE VITAL SIGNS:  Temperature 98.3, pulse 88, respirations 14, blood pressure 125/68, and O2 saturation 100% on room air.  DISCHARGE LAB RESULTS: 1. BMET:  Sodium 140, potassium 3.5, chloride 109, bicarb 28, glucose     137, BUN 6, creatinine 0.75, and calcium 8.0. 2. CBC:  White blood cell 7.1, hemoglobin 7.4, hematocrit 21.5, and     platelets 205. 3. H. pylori serum antibody IgG less than 0.40. 4. The patient had three sets of negative cardiac enzymes during this     hospitalization.    ______________________________ Blanca Friend, MD   ______________________________ Blanch Media, M.D.    BW/MEDQ  D:  10/08/2010  T:  10/08/2010  Job:  [id number]  Electronically Signed by Blanca Friend MD on 10/09/2010 07:57:00 PM Electronically Signed by Blanch Media M.D. on 10/10/2010 04:32:53 PM

## 2010-10-11 DIAGNOSIS — K922 Gastrointestinal hemorrhage, unspecified: Secondary | ICD-10-CM

## 2010-10-14 ENCOUNTER — Ambulatory Visit (INDEPENDENT_AMBULATORY_CARE_PROVIDER_SITE_OTHER): Payer: Self-pay | Admitting: Internal Medicine

## 2010-10-14 ENCOUNTER — Encounter: Payer: Self-pay | Admitting: Internal Medicine

## 2010-10-14 DIAGNOSIS — K922 Gastrointestinal hemorrhage, unspecified: Secondary | ICD-10-CM

## 2010-10-14 DIAGNOSIS — M1990 Unspecified osteoarthritis, unspecified site: Secondary | ICD-10-CM

## 2010-10-14 DIAGNOSIS — K279 Peptic ulcer, site unspecified, unspecified as acute or chronic, without hemorrhage or perforation: Secondary | ICD-10-CM

## 2010-10-14 LAB — CBC
MCHC: 32.1 g/dL (ref 30.0–36.0)
RDW: 14.7 % (ref 11.5–15.5)

## 2010-10-14 NOTE — Assessment & Plan Note (Addendum)
Check orthostatic-  patient is orthostatic by pulse criteria Check CBC-- improved from 9 to 11 (may be hemoconcentration as well)  Rectal exam shows black stool but no fresh blood. Seeing his surgeon tomorrow for stitches removal  Will advise plenty oral fluids and follow up in 1 month.

## 2010-10-14 NOTE — Patient Instructions (Signed)
Follow up in 1 month. Come to the clinic early if you are having worsening dizziness, fatigue, blood in stool Drink plenty of fluids

## 2010-10-14 NOTE — Progress Notes (Signed)
40 year old man with past medical history of  GERD was admitted early September for upper GI bleed . He had an EGD which failed to stop the bleeding. He had an exploratory laparotomy. He comes for follow up. He complains of dizziness on getting up since this morning. He also complains of exertional dyspnea since the surgery. He has been having black stools since past couple of months but no fresh blood. He has arthritic pain and he takes Percocet for it. Denies ibuprofen, Advil, or other NSAID use. Takes Protonix 40 mg twice a day. Denies any leg swelling, chest pain, resting shortness of breath. Is going to be seen by his surgeon tomorrow for stitches removal.  Physical exam  BP 110/68  Pulse 101  Temp(Src) 98.6 F (37 C) (Oral)  Ht 5\' 9"  (1.753 m)  Wt 195 lb 1.6 oz (88.497 kg)  BMI 28.81 kg/m2  SpO2 98%  General Appearance:    Alert, cooperative, no distress, appears stated age  Head:    Normocephalic, without obvious abnormality, atraumatic  Eyes:    PERRL, conjunctiva/corneas clear, EOM's intact, fundi    benign, both eyes       Ears:    Normal TM's and external ear canals, both ears  Nose:   Nares normal, septum midline, mucosa normal, no drainage   or sinus tenderness  Throat:   Lips, mucosa, and tongue normal; teeth and gums normal  Neck:   Supple, symmetrical, trachea midline, no adenopathy;       thyroid:  No enlargement/tenderness/nodules; no carotid   bruit or JVD  Back:     Symmetric, no curvature, ROM normal, no CVA tenderness  Lungs:     Clear to auscultation bilaterally, respirations unlabored  Chest wall:    No tenderness or deformity  Heart:    Regular rate and rhythm, S1 and S2 normal, no murmur, rub   or gallop  Abdomen:     central midline scar from laparotomy with stitches. Soreness around the scar. No distention or edema or pain.   Rectal:    Normal tone, normal prostate, no masses or tenderness;  Black stools without any fresh blood   Extremities:    Extremities normal, atraumatic, no cyanosis or edema  Pulses:   2+ and symmetric all extremities  Skin:   Skin color, texture, turgor normal, no rashes or lesions  Lymph nodes:   Cervical, supraclavicular, and axillary nodes normal  Neurologic:   CNII-XII intact. Normal strength, sensation and reflexes      throughout   Review of system  Constitutional: Denies fever, chills, , appetite change. Has diaphoresis and fatigue. HEENT: Denies photophobia, eye pain, redness, hearing loss, ear pain, congestion, sore throat, rhinorrhea, sneezing, mouth sores, trouble swallowing, neck pain, neck stiffness and tinnitus.   Respiratory: Complains of DOE, denies cough, chest tightness,  and wheezing.   Cardiovascular: Denies chest pain, palpitations and leg swelling.  Gastrointestinal: Denies nausea, vomiting, abdominal pain, diarrhea, constipation, blood in stool and abdominal distention.  had abdominal pain around the surgical scar Genitourinary: Denies dysuria, urgency, frequency, hematuria, flank pain and difficulty urinating.  Musculoskeletal: Denies myalgias, back pain, joint swelling, arthralgias and gait problem.  Skin: Denies pallor, rash and wound.  Neurological: Denies dizziness, seizures, syncope, weakness, light-headedness, numbness and headaches.  Hematological: Denies adenopathy. Easy bruising, personal or family bleeding history  Psychiatric/Behavioral: Denies suicidal ideation, mood changes, confusion, nervousness, sleep disturbance and agitation

## 2010-10-15 ENCOUNTER — Encounter (INDEPENDENT_AMBULATORY_CARE_PROVIDER_SITE_OTHER): Payer: Self-pay | Admitting: General Surgery

## 2010-10-15 ENCOUNTER — Encounter (INDEPENDENT_AMBULATORY_CARE_PROVIDER_SITE_OTHER): Payer: Self-pay

## 2010-10-15 NOTE — Op Note (Signed)
[name], [name] NO.:  [id number]  MEDICAL RECORD NO.:  [id number]  LOCATION:  2605                         FACILITY:  [facility]  PHYSICIAN:  Graylin Shiver, M.D.   DATE OF BIRTH:  [birth year]  DATE OF PROCEDURE:  10/03/2010 DATE OF DISCHARGE:                              OPERATIVE REPORT   PROCEDURE:  Esophagogastroduodenoscopy.  SURGEON:  Graylin Shiver, MD  INDICATIONS FOR PROCEDURE:  The patient was just discharged from the hospital recently after being here for an upper GI bleed.  He went home in stable condition but returns today and I was called by the Teaching Service to reassess him endoscopically because of the recurrence of bleeding.  He started having melena at home and dropped his blood count and started to feel lightheaded and sweaty.  He was brought down to the Endoscopy Unit where he was seen and endoscopy is being done to evaluate.  Informed consent was obtained after explanation of the risks of bleeding, infection, and perforation.  PREMEDICATIONS:  Fentanyl 100 mcg IV, Versed 10 mg IV.  PROCEDURE IN DETAIL:  With the patient in the left lateral decubitus position, the Pentax gastroscope was inserted into the oropharynx and passed into the esophagus.  It was advanced down the esophagus with visualization of multiple ulcers in the distal esophagus which had been seen before.  There was no evidence of active bleeding.  I then entered the stomach which initially showed a hiatal hernia.  I then passed the scope beyond the hiatal hernia and could see some old blood which was suctioned out of the stomach.  The scope was advanced down into the duodenum.  The second portion and bulb of the duodenum were normal.  The antrum of the stomach looked normal.  Retroflexion revealed that in the distal aspect of the hiatal hernia in the fundus there was an Endoclip which I had previously placed on a ulcer in this area which was felt to be the bleeding  site last time.  Adjacent to this was a reddish erosion which was not bleeding but I went ahead and injected 2 mL of 1:10,000 epinephrine into this area.  Below the clip on the greater curvature of the stomach in the upper portion of the greater curvature was an adherent clot.  I was unable to see under this.  I tried to roll the patient from side to side to see if I could move this clot, but it would not move.  The clot was not removed at this time.  He tolerated the procedure well without complications.  IMPRESSION: 1. Multiple ulcers in the distal esophagus. 2. Hiatal hernia. 3. Prior Endoclip seen in the upper stomach just past the hiatal     hernia which appeared to be in good position with a reddish erosion     adjacent to it which was injected with 1:10,000 epinephrine.  There     was an adherent clot on the greater curvature in the upper stomach     but I was unable to see under this.  PLAN:  I would transfer the patient to Step-Down or ICU at this time.  I  would transfuse 2 units of packed red cells, continue PPI therapy, and blood on hold in the event that he needs further transfusion.  The patient will be scheduled for repeat endoscopy tomorrow with hopes of being able to see a little better.  We will be available in the event he needs further emergent endoscopy this evening, however.          ______________________________ Graylin Shiver, M.D.     SFG/MEDQ  D:  10/03/2010  T:  10/04/2010  Job:  [id number]  Electronically Signed by Herbert Moors MD on 10/15/2010 08:56:36 AM

## 2010-10-15 NOTE — Progress Notes (Unsigned)
PT ARRIVED FOR STAPLE REMOVAL FROM  MIDLINE ABDOMINAL INCISION. SMALL 2" SQUARE DRESSING REMOVED FROM 1" ABOVE UMBILICUS. DRESSING HAD SMALL AMOUNT PINK TINGED DRAINAGE. NO REDNESS NOTED ALONG INCISION EXCEPT FOR WHERE TAPE WAS PLACED. OPENING 1/4 "  NOTED BETWEEN 2 STAPLES 1" ABOVE UMBILICUS. ABDOMEN SOFT, NON-TENDER, NO NAUSEA OR VOMITING, BOWELS MOVING OK AND PT IS URINATING WITHOUT DIFFICULTY. DR. Karie Schwalbe CORNETT NOTIFIED AND INSTRUCTED ME TO USE Q-TIP TO GENTLY PROBE OPENING. OPENING OPENED SLIGHTLY WITHOUT DRAINAGE. 1" PIECE OF 1/4" NU GAUZE INSERTED INTO OPENING WITHOUT DIFFICULTY. PT AND WIFE WERE INSTRUCTED TO REMOVE DRESSING IN AM AND PT COULD SHOWER AND REDRESS INCISION. PT TO NOTIFY OFFICE IF FEVER NOTED, REDNESS AROUND INCISION  OR INCREASED DRAINAGE. PT HAS F/U APPT WITH DR. Luisa Hart NEXT WEEK.

## 2010-10-15 NOTE — Discharge Summary (Signed)
[name], [name] NO.:  [id number]  MEDICAL RECORD NO.:  [id number]  LOCATION:  5153                         FACILITY:  [facility]  PHYSICIAN:  Blanch Media, M.D.DATE OF BIRTH:  17-Dec-1970  DATE OF ADMISSION:  10/02/2010 DATE OF DISCHARGE:  10/11/2010                              DISCHARGE SUMMARY   DISCHARGE DIAGNOSES: 1. Acute blood loss anemia. 2. Gastroesophageal reflux disease.  DISCHARGE MEDICATIONS: 1. Percocet 5/325 mg 1-2 tablets by mouth every 4 hours as needed, #40     prescribed. 2. Protonix 40 mg p.o. b.i.d.  DISPOSITION AND FOLLOWUP:  He does have an appointment to see Dr. Mikeal Hawthorne September 11 at 2:45, this is a primary care physician.  I think this was made by the surgical team.  He also has an appointment with our outpatient clinic to see Dr. Scot Dock on September 11 at 3:45.  I discussed with the patient the duplicate appointments and told him that he should attend 1 appointment and call the other to cancel as these are both primary care establishment visits.  He also has an appointment on September 12 at 9 o'clock in the morning with Dr. Rosezena Sensor office to have his staples removed and then he goes back on September 21 at 10:30 in the morning to see Dr. Luisa Hart for wound check.  At this visit in clinic, please check on resolution of symptoms to make sure he is still eating, drinking, passing bowel movements, not having excessive pain at the surgical site and did this for establishment of PCP.  He will a repeat EGD in 6-8 weeks to make sure that everything is healing.  PROCEDURES PERFORMED:  EGD x2 as well as an exploratory laparotomy with oversewing of linear gastric ulcer and biopsy of gastric ulcer in the fundus/ body of the stomach.  CONSULTATIONS: 1. Thomas A. Cornett, MD was consulted. 2. GI was consulted, Shirley Friar, MD.  ADMITTING HISTORY AND PHYSICAL:  Mr. [name] is a 40 year old guy with a history of GERD status  post surgery, possible Nissen fundoplication about 20 years ago, admitted to our service originally in August 26.  He was found to have a bleeding ulcer on endoscopy.  Prior to that endoscopy, his hemoglobin was falling and he did require a total of 5 units during that admission in addition to a lot of IV fluids, so GI then did an EGD and did inject the ulcer with epinephrine and clip and then this stabilized his hemoglobin.  He did well and was discharged on August 29.  His hemoglobin was 7.5 on discharge and his episodes of melena had resolved prior to this discharge.  It sounds like he was acutely stable after that EGD.  It was felt that this ulcer was caused by excessive NSAID use.  Apparently, he was taking a lot of ibuprofen for some osteoarthritis pain that he was having at home.  We did advise him not to restart this upon going home.  So, he went home on the 29th and he started having couple episodes of melena at home on the morning of admission on the 30th and he was getting sweaty and felt lightheaded. Had similar  symptoms before his first admission, and so he was concerned that he was rebleeding.  He has not had any hematemesis and there was no bright red blood in his stools, that was non-melanotic.  He had no fevers or chills at home.  No chest pain, no shortness of breath, no headache, no urinary symptoms, no constipation, no diarrhea, no rash. Mild increase in his lower abdominal pain.  This abdominal pain was present during his last admission; however, the patient feels like it has gotten a little bit worse since discharge.  HOME MEDICATIONS: 1. He is on Prilosec 20 mg p.o. b.i.d. 2. Iron sulfate 325 mg p.o. b.i.d. 3. Tylenol p.r.n. for pain.  He has no known drug allergies.  PAST MEDICAL HISTORY:  Significant just for the GERD and joint pains and he did have EGD during last hospitalization.  PHYSICAL EXAMINATION:  VITAL SIGNS:  Today, his temperature was 98.9, pulse  82, blood pressure 121/71, respirations 20, oxygen saturation 97% on room air.  Orthostatic vitals were done.  On the supine, his blood pressure was 121/71, pulse 82.  Upon sitting, it did decrease to 116/66 with a pulse of 94 and upon standing, blood pressure was 121/65 with a the pulse was 107, so those were positive. GENERAL:  Alert, well-developed, cooperative to exam. HEENT:  Head:  Normocephalic, atraumatic.  Eyes:  Vision grossly intact. Pupils equal, round, reactive to light and accommodation.  No injection or anicteric.  Mouth:  Pharynx was pink and moist.  No erythema, no exudates. NECK:  Supple.  Full range of motion.  No thyromegaly, no JVD, no carotid bruits. LUNGS:  Normal respiratory effort.  No accessory muscle use.  Normal breath sounds.  No crackles or rales. HEART:  Normal rate, regular rhythm.  No murmurs, rubs, gallops. ABDOMEN:  Soft, normal bowel sounds.  No distention.  Moderate tenderness in the central and lower quadrant to light/medium palpation, moderate epigastric tenderness to deep palpation only.  No guarding, no rebound tenderness. MUSCULOSKELETAL:  There were no joint swelling, no joint warmth and no redness over any other joint.  Pulses were 2+ in the dorsalis pedis and posterior tibial pulses bilaterally. EXTREMITIES:  There was no cyanosis, clubbing, or edema. NEUROLOGICAL:  He was alert and oriented x3.  Cranial nerves II through XII were intact.  Strength was normal in all extremities.  Sensation was intact to light touch.  Gait was not tested due to the dizziness. SKIN:  Turgor was normal.  No rashes. PSYCHOLOGICAL:  He was oriented x3.  Memory was intact for recent and remote. Normally interactive, good eye contact. Mildly anxious appearing, not depressed appearing.  LABORATORY RESULTS FROM ADMISSION:  There was a basic metabolic panel that did show sodium of 140, potassium 4.1, chloride 107, bicarb 25, BUN 10, creatinine 0.69, a glucose of  109, calcium 8.9.  There was a CBC that was drawn that did show white count 13.2, hemoglobin of 8.2, platelets of 231.  He did have a PT/INR that was done.  PT was normal, INR was 1.01, PTT was 27.  There was an FOBT that was done that was positive.  HOSPITAL COURSE BY PROBLEM: 1. Acute blood loss anemia.  This was probably due to GI bleed from     his ulcer.  On admission, it was not really felt that he was     bleeding again since his hemoglobin had not fallen since discharge,     but they did expect melena on the days  following GI bleed, so they     were not concerned; however, they did admit him.  He did have     dizziness, diaphoresis and he was orthostatic at that point.  So,     they thought it was combination of hemoglobin low and anxiety, but     they did admit him to make sure was not still bleeding.  He did get     2-liter bolus in the ED and he did get a Protonix IV 40 b.i.d.     They did watch him considerably.  They then did type and screen 4 units.     They gave him 1 unit of packed cells and they did have parameters     for transfusion at hemoglobin less than 8 since was still felt to     be symptomatic on admission.  They did do SCDs for DVT prophylaxis.     During this admission, his hemoglobin did continue to decline.  He     did require EGD which did find that there was a pretty large clot     in the stomach, right around the area of the ulcer, which was not     able to be removed.  They actually tried 2 times with EGD.  Dr.     Bosie Clos did try to remove this clot in the stomach.  It was felt     that they were not sure if that was still bleeding underneath the     clot, so they did not feel that it was safe to remove via EGD, so     surgery was consulted (due to pt becoming unresponsive) and Dr. Luisa Hart did do an exploratory     laparotomy with oversewing of the ulcer and they did take a biopsy     of that ulcer as well.  So, estimated blood loss was about 600 mL,      100 mL of blood with 500 mL of "clot removed from the stomach."  So     prior to the surgery, he was kind of found unresponsive.  After     surgery, the patient was very stable.  His hemoglobin was fairly     stable during the remainder of the admission.  He did require a little bit of blood; however,     after that, his hemoglobin did normalize and he did do very well.     He was kept n.p.o. for several days after surgery.  He was on a     Protonix drip and he was getting IV fluids.  Eventually, we were     able to restart liquids into his diet and get him ambulating.  He     did eventually have a bowel movement on the 6th and he was     continuing to ambulate on 7th.  We kind of advanced his diet and then     on the day of discharge, he was having only mild pain in the     surgical site and slight serosanguinous discharge. His hemoglobin was very stable.  There were no     concerning episodes.  He did have a little bit of darkness in his     bowel movements.  We talked about how that was normal and probably     happened after this recent surgery and GI bleeding.  Clearly,     stated no NSAIDs, no aspirin, no ibuprofen.  No alcohol use, no  tobacco use.  He will follow up with Dr. Luisa Hart for staple removal     as well as wound check.  He will also follow up with our outpatient     clinic to establish a primary care physician, otherwise he will     probably need a repeat EGD in 6-8 weeks to just established if this     ulcer is healing and is not still bleeding and we have advised him     on when he should return for symptoms. 2. GERD.  The patient was on Protonix drip during this admission and     then was titrated to p.o.  He did go home on 40 mg twice daily of     p.o. to control his GERD symptoms as well to be protective for the     stomach, just to follow up on symptom management at outpatient     visit.  DISCHARGE LABS AND VITALS:  The vitals on the day of discharge,  his temperature was 98.5, pulse 81, respirations 20, blood pressure 126/78. He was satting 96% on room air.  LABORATORY RESULTS ON THE DAY OF DISCHARGE:  He did have a white blood count of 7.4, hemoglobin of 8.8, a platelet count 416.  He did have a basic metabolic panel that did show a sodium of 141, potassium 4.0, chloride 108, bicarb 26, BUN 3, creatinine 0.68, glucose of 112, calcium of 8.6.  There were no pending lab tests at the day of discharge and there was some angiogenic ultrasound-guided things with the EGD that were used.  Other than that, there were no other imaging results during this admission.    ______________________________ Genella Mech, MD   ______________________________ Blanch Media, M.D.    EK/MEDQ  D:  10/11/2010  T:  10/11/2010  Job:  [id number]  cc:   Lonia Blood, M.D. Thomas A. Cornett, M.D. Blanch Media, M.D.  Electronically Signed by Genella Mech MD on 10/13/2010 12:03:17 PM Electronically Signed by Blanch Media M.D. on 10/15/2010 03:08:01 PM

## 2010-10-16 ENCOUNTER — Encounter (INDEPENDENT_AMBULATORY_CARE_PROVIDER_SITE_OTHER): Payer: Self-pay | Admitting: General Surgery

## 2010-10-16 ENCOUNTER — Ambulatory Visit (INDEPENDENT_AMBULATORY_CARE_PROVIDER_SITE_OTHER): Payer: Self-pay | Admitting: General Surgery

## 2010-10-16 VITALS — BP 128/70 | HR 76 | Temp 97.0°F

## 2010-10-16 DIAGNOSIS — K254 Chronic or unspecified gastric ulcer with hemorrhage: Secondary | ICD-10-CM

## 2010-10-16 NOTE — Patient Instructions (Signed)
Normal saline damp to dry dressing change to open part of wound twice a day. Avoid heavy lifting, bending, or straining. Keep appointment with Dr. Luisa Hart.  For dressing changes you will need 4 x 4 gauze, non-sterile gloves, and heavy absorbent pads.  You will also need medical tape.

## 2010-10-16 NOTE — Progress Notes (Signed)
Operation:  Emergency exploratory laparotomy, oversewing of bleeding gastric ulcer.  Date:  10/04/10  Pathology:  Benign  HPI:  [name] resents to the urgent office complaining of drainage from his midline incision. This began yesterday. He is eating. No fevers or chills.   Physical Exam:  [name] looks well and in no acute distress. He is afebrile.  Abdomen-upper midline incision is partially opened inferiorly. There is erythema around the superior aspect. I opened the superior portion of the wound. Cloudy, brown fluid was drained. There was a skin bridge between the inferior and superior openings. I left this intact. Both open wounds were packed with saline moistened gauze followed by dry dressing. I showed his wife how to do this.   Assessment:  Midline wound infection.  Plan:  Normal saline damp to dry dressing changes twice a day. Wear abdominal binder. Keep visit with Dr. Luisa Hart next week

## 2010-10-17 NOTE — Consult Note (Signed)
NAMEMARDELL, Bruce NO.:  [id number]  MEDICAL RECORD NO.:  [id number]  LOCATION:  2605                         FACILITY:  [facility]  PHYSICIAN:  Mary Sella. Andrey Campanile, MD     DATE OF BIRTH:  Apr 07, 1970  DATE OF CONSULTATION:  10/04/2010 DATE OF DISCHARGE:                                CONSULTATION   REQUESTING PHYSICIAN:  Shirley Friar, MD  REASON FOR CONSULTATION:  Upper GI bleed.  CHIEF COMPLAINT:  "I am having abdominal pain and I have been bleeding."  HISTORY OF PRESENT ILLNESS:  [name] is a very pleasant 39 year old Caucasian male who has a remote history of a laparoscopic Nissen fundoplication more than 20 years ago for gastroesophageal reflux disease who has been taking ibuprofen on a frequent basis for joint pain.  [name] was readmitted on October 02, 2010, for recurrent gastric bleed.  [name] was initially admitted on September 28, 2010, for melena and lightheadedness.  An upper endoscopy done on September 29, 2010, showed multiple ulcers in the distal esophagus, which were not bleeding and ulcer in [name] fundus in [name] wrap with clot.  The area was injected and clipped.  The duodenum was normal.  [name] reportedly received 5 units of PRBC during that admission.  [name] recovered and was discharged to home on October 01, 2010.  [name] came back to the emergency apartment on the 30th complaining of melena, diaphoresis, and lightheadedness.  [name] denied any hematochezia or hematemesis.  [name] had nausea.  [name] underwent a repeat EGD on the 31st which showed an erosion next to the previously placed clip which is not actively bleeding, however, it was injected with epinephrine.  There was a large clot in the greater curvature of the stomach which was unable to be removed.  The plan was for him to go back the following day for repeat endoscopy.  [name] was taken back earlier today and the ulcer was identified and the clot was evacuated and there was subsequently a fair amount of arterial  pumping next to the hemoclip. There was approximately 1 liter of blood loss.  It was injected and several additional hemoclips were deployed.  According to report, there were no active signs of bleeding at the end.  [name] was then taken to Interventional Radiology for angiogram and possible embolization. Intervention was unable to cannulate [name] left gastric artery, therefore the procedure was aborted.  [name] was then brought back to Step-Down Unit where [name] became hypotensive to a systolic in the 80s and obtunded.  [name] was given a fluid bolus and vitals normalized and [name] became more appropriate.  Surgery was consulted in the midportion of the afternoon during all these events.  So far during the second admission, [name] has gotten 5 units of PRBC for a total of 10 units of PRBC since [name] initial presentation on the 26th.  PAST MEDICAL HISTORY: 1. Gastroesophageal reflux disease. 2. Joint pain.  PAST SURGICAL HISTORY:  Laparoscopic Nissen fundoplication up in [place]  ALLERGIES:  No known drug allergies.  MEDICATIONS:  Prilosec, iron, and Tylenol.  FAMILY HISTORY:  Significant for diabetes mellitus and coronary artery disease.  No malignancy.  SOCIAL  HISTORY:  Denies drugs, alcohol, or tobacco.  [name] is currently living with [name] brother and [name] family.  REVIEW OF SYSTEMS:  [name] does complain of some abdominal pain and some nausea.  [name] has had several days of lightheadedness and dizziness.  [name] denies alcoholism.  Otherwise, a comprehensive 12-point review of systems was performed and all systems are negative except for as mentioned in the HPI.  PHYSICAL EXAMINATION:  VITAL SIGNS:  [name] is afebrile.  [name] pulse is 78. [name] is now normotensive at 132/69.  Oxygen saturation 100% on room air. GENERAL:  [name] is alert and in no apparent distress. LUNGS:  Clear to auscultation. HEART:  [name] has regular rhythm. ABDOMEN:  Soft.  [name] is essentially nontender, but perhaps was found to have some mild tenderness  in [name] upper abdomen.  There is certainly no rebound and no guarding.  [name] has a well-healed laparoscopic trocar site. EXTREMITIES:  [name] moves all extremities. MUSCULOSKELETAL:  No obvious joint deformity.  Strength is symmetric. NEUROLOGIC:  Nonfocal.  Sensation grossly intact. PSYCHIATRIC:  [name] is oriented x3.  Judgment and insight appear appropriate. SKIN:  No jaundice or edema. HEENT:  Pupils are equal and round and no scleral icterus. NECK:  Supple.  No lymphadenopathy.  Trachea is midline.  I reviewed all [name] EGD reports as well as [name] history and physical.  LABORATORY DATA:  I reviewed a CBC from this afternoon which showed a white count of 12.6, hemoglobin of 10.2 up from 8.7 after 2 units of PRBC, hematocrit of 28.9 up from 25.2, and platelet count of 257.  Basic metabolic panel and coags are pending for today.  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  PLAN:  I recommend continuing a Protonix drip.  I would not give the patient any Toradol.  We are going to check a stat BMET and coags.  The patient has a high likelihood to rebleed especially given [name] hypotension after [name] endoscopy late this afternoon.  Given [name] multiple transfusions and the finding of an arterial pumper, I believe [name] has a high risk of rebleeding.  I have discussed this case with Dr. Luisa Hart who is the surgeon on-call for [facility] today.  We both are in agreement that we are recommending exploratory laparotomy.  We discussed continued nonsurgical management versus surgical management.  The patient has chosen to proceed to the operating room for exploratory laparotomy.  We will make sure [name] is blood available with antibiotics since there is a question of sequential compression  devices on-call to the operating room.     Mary Sella. Andrey Campanile, MD     EMW/MEDQ  D:  10/04/2010  T:  10/04/2010  Job:  [id number]  cc:   Shirley Friar, MD Graylin Shiver, M.D.  Electronically Signed by Gaynelle Adu M.D. on 10/17/2010 08:44:39 AM

## 2010-10-20 ENCOUNTER — Ambulatory Visit (INDEPENDENT_AMBULATORY_CARE_PROVIDER_SITE_OTHER): Payer: Self-pay | Admitting: General Surgery

## 2010-10-20 ENCOUNTER — Encounter (INDEPENDENT_AMBULATORY_CARE_PROVIDER_SITE_OTHER): Payer: Self-pay | Admitting: General Surgery

## 2010-10-20 VITALS — BP 118/68 | HR 90 | Temp 96.6°F | Wt 194.4 lb

## 2010-10-20 DIAGNOSIS — S31109A Unspecified open wound of abdominal wall, unspecified quadrant without penetration into peritoneal cavity, initial encounter: Secondary | ICD-10-CM

## 2010-10-20 NOTE — Progress Notes (Signed)
Subjective:     Patient ID: [name], male   DOB: Jan 07, 1971, 40 y.o.   MRN: [id number]  HPI This is a 40 year old male who on 3 September underwent a laparotomy with oversewing of a gastric ulcer by Dr. Luisa Hart. He was seen by Dr. Abbey Chatters on Friday with drainage from his wound which was opened by Dr. Abbey Chatters. He is eating  well and is having normal bowel movements. He denies any fevers. They have been changing the dressings twice a day since Friday. His wife called earlier today concerned about the drainage seen it was a yellowish color. There is no green drainage or any purulence.  Review of Systems     Objective:   Physical Exam Midline wound with no erythema, two open areas, the inferior portion is healthy and pink, the superior portion has exudate present as well as some stitch material but no infection present    Assessment:     Open anterior wound     Plan:        We discussed continuing twice daily dressing changes. I told him he could take a shower and apply his dressing after that. I reassured them that this was all normal and that he would take weeks for this to heal. We discussed that this may need to be debrided a little bit in the office at some point. For right now I would just continue the dressing changes and hopefully this will mechanically debride some of this tissue. I discussed with him trying to increase his protein intake to help this heal as well with supplements. He is going to return on Friday for a wound check with Dr. Luisa Hart. We also discussed what to watch for that they would need to call us back sooner.

## 2010-10-22 NOTE — Op Note (Signed)
[name], [name] NO.:  [id number]  MEDICAL RECORD NO.:  [id number]  LOCATION:  2605                         FACILITY:  [facility]  PHYSICIAN:  Shirley Friar, MDDATE OF BIRTH:  August 22, 1970  DATE OF PROCEDURE: DATE OF DISCHARGE:                              OPERATIVE REPORT   INDICATIONS:  GI bleed.  MEDICATIONS:  Fentanyl 90 mcg IV, Versed 10 mg IV, Cetacaine spray x2, epinephrine saline 4 mL injection submucosally.  FINDINGS:  Endoscope was inserted through the oropharynx and esophagus was intubated.  The esophagus was normal in its entirety.  The GE junction was noted at 35 cm from the incisors.  Upon entering the stomach, there was a large area of blood clot that was bright red and maroon color noted in the dependent portion of the fundus.  Retroflex was done which revealed a hemoclip intact with a small linear ulcer approximately 3 cm in length near this clip.  The dependent blood products were suctioned but due to multiple clots the standard endoscope was removed and a therapeutic endoscope was inserted and used to remove the remaining clots.  There was no evidence of a bleeding source underneath the dependent portion of the fundus where the clot had been. The double channel endoscope was removed and the standard endoscope was reinserted and upon reevaluating the dependent portion again there was no mucosal abnormalities noted.  Retroflexion was again done and the previously hemoclipped area began oozing blood which became more profuse and they began arterial pumping and spurting from this site.  The visual field was very difficult to see and about a liter of blood passed into the stomach during this part of the procedure.  A 2 mL of epinephrine were injected with minimal benefit.  A hemoclip was then deployed at the base of the other hemoclip but due to the angle in difficulty with retroflexion the hemoclip could not be deployed right at the base,  but was deployed near to the base as possible.  In addition, hemoclip was deployed as well.  A third hemoclip was dropped into the lumen during the procedure and not deployed into the mucosal tissue due to lack of vision of the field and inability to remove the clip.  The area was irrigated multiple times and suctioning was done and the bleeding site was stopped from bleeding at the conclusion of the procedure.  The bleeding site was noted to be in the fundic Nissen wrap.  Prior to these therapeutic maneuvers, the standard endoscope did advanced into the distal stomach into the duodenal bulb and second portion of duodenum which were unremarkable.  Endoscope was withdrawn to confirm above findings.  ASSESSMENT: 1. Active bleeding from fundus at the level of the gastric wrap and at     the site of the previously placed hemoclip.  Status post two     hemoclip deployments and epinephrine 4 mL injected around the base.     Hemostasis was achieved at this time, although this area is     concerning for rebleeding.  A small linear ulcer was noted adjacent     to this initial hemoclip that was previously placed  prior to this     procedure. 2. No mucosal abnormalities seen below the dependent portion of the     fundus and in the location of the clots that were noted upon     insertion.  PLAN: 1. Recommend embolization by Interventional Radiology to try and     prevent further bleeding and that is not successful then he will     need surgery.  Discuss these findings with Dr. Lowella Dandy of IR as well     as with the patient's     brother and sister-in-law. 2. Transfuse 3. Placed on continuous Protonix IV infusion.     Shirley Friar, MD     VCS/MEDQ  D:  10/04/2010  T:  10/04/2010  Job:  [id number]  cc:   Graylin Shiver, M.D.  Electronically Signed by Charlott Rakes MD on 10/22/2010 11:03:06 AM

## 2010-10-22 NOTE — Consult Note (Signed)
  NAME:  [name], RECORE NO.:  [id number]  MEDICAL RECORD NO.:  [id number]  LOCATION:  6703                         FACILITY:  [facility]  PHYSICIAN:  Petra Kuba, M.D.    DATE OF BIRTH:  19-Oct-1970  DATE OF CONSULTATION: DATE OF DISCHARGE:                                CONSULTATION   HISTORY:  The patient with a history of reflux and 20 years ago a Nissen fundoplication who would had a minimal amount of reflux over the years, but has not been on any stomach medicine and has not had a follow up endoscopy and no other obvious GI problems until he started passing black stools on Saturday.  On Sunday, he threw up some blood and the black stools continued and he had some increased weakness and dizziness and presented to the emergency room with a hemoglobin of 9 and we were consulted for further workup and plans.  He had been on ibuprofen almost every day for general aches and pains mostly in his legs, but has no other complaints.  PAST MEDICAL HISTORY:  Pertinent for the reflux and surgery as well as some cysts removed, but otherwise no medical problems.  MEDICATIONS AT HOME:  Include p.r.n. Tums, Rolaids, and ibuprofen as above.  ALLERGIES:  None.  FAMILY HISTORY:  Negative for any obvious GI problems.  SOCIAL HISTORY:  Denies tobacco or alcohol.  REVIEW OF SYSTEMS:  Negative except above.  PHYSICAL EXAMINATION:  GENERAL:  No acute distress lying comfortably in bed. VITAL SIGNS:  Stable, afebrile. LUNGS:  Clear. HEART:  Regular rate and rhythm. ABDOMEN:  Soft, nontender.  Good bowel sounds.  LABORATORY DATA:  Labs pertinent for hemoglobin of 9.1 which dropped to 6.7 with hydration and probably ongoing bleeding overnight, white count 15.6, platelets 241.  MCV is normal at 89.  BUN on admission was 26.  PT normal.  Liver tests were normal.  Albumin 3.0.  Anemia panel is normal with a ferritin of 44, iron of 85.  CPKs were negative.  ASSESSMENT:  Upper  gastrointestinal bleeding in a patient with a hiatal hernia repair and on lots of ibuprofen.  PLAN:  The risks, benefits, methods of endoscopy was discussed with the patient and will proceed when stable and getting some blood by my partner Dr. Evette Cristal, who I have discussed the case with, but further workup and plans pending those findings.          ______________________________ Petra Kuba, M.D.     MEM/MEDQ  D:  09/29/2010  T:  09/29/2010  Job:  [id number]  Electronically Signed by Vida Rigger M.D. on 10/22/2010 02:56:00 PM

## 2010-10-24 ENCOUNTER — Encounter (INDEPENDENT_AMBULATORY_CARE_PROVIDER_SITE_OTHER): Payer: Self-pay | Admitting: Surgery

## 2011-01-24 ENCOUNTER — Other Ambulatory Visit: Payer: Self-pay

## 2011-01-24 ENCOUNTER — Emergency Department (HOSPITAL_COMMUNITY): Payer: Self-pay

## 2011-01-24 ENCOUNTER — Emergency Department (HOSPITAL_COMMUNITY)
Admission: EM | Admit: 2011-01-24 | Discharge: 2011-01-24 | Disposition: A | Discharge status: Home / Self Care | Payer: Self-pay | Attending: Emergency Medicine | Admitting: Emergency Medicine

## 2011-01-24 ENCOUNTER — Encounter (HOSPITAL_COMMUNITY): Payer: Self-pay | Admitting: *Deleted

## 2011-01-24 DIAGNOSIS — R5381 Other malaise: Secondary | ICD-10-CM | POA: Insufficient documentation

## 2011-01-24 DIAGNOSIS — R61 Generalized hyperhidrosis: Secondary | ICD-10-CM | POA: Insufficient documentation

## 2011-01-24 DIAGNOSIS — R42 Dizziness and giddiness: Secondary | ICD-10-CM | POA: Insufficient documentation

## 2011-01-24 DIAGNOSIS — Z79899 Other long term (current) drug therapy: Secondary | ICD-10-CM | POA: Insufficient documentation

## 2011-01-24 DIAGNOSIS — R10819 Abdominal tenderness, unspecified site: Secondary | ICD-10-CM | POA: Insufficient documentation

## 2011-01-24 DIAGNOSIS — K219 Gastro-esophageal reflux disease without esophagitis: Secondary | ICD-10-CM | POA: Insufficient documentation

## 2011-01-24 DIAGNOSIS — R110 Nausea: Secondary | ICD-10-CM | POA: Insufficient documentation

## 2011-01-24 DIAGNOSIS — R109 Unspecified abdominal pain: Secondary | ICD-10-CM | POA: Insufficient documentation

## 2011-01-24 HISTORY — DX: Gastrointestinal hemorrhage, unspecified: K92.2

## 2011-01-24 HISTORY — DX: Gastro-esophageal reflux disease without esophagitis: K21.9

## 2011-01-24 LAB — COMPREHENSIVE METABOLIC PANEL
BUN: 13 mg/dL (ref 6–23)
CO2: 23 mEq/L (ref 19–32)
Calcium: 9.2 mg/dL (ref 8.4–10.5)
Creatinine, Ser: 0.77 mg/dL (ref 0.50–1.35)
GFR calc Af Amer: >90 mL/min (ref >90)
GFR calc non Af Amer: >90 mL/min (ref >90)
Glucose, Bld: 111 mg/dL — ABNORMAL HIGH (ref 70–99)

## 2011-01-24 LAB — CBC
HCT: 40.3 % (ref 39.0–52.0)
Hemoglobin: 13.6 g/dL (ref 13.0–17.0)
MCH: 29 pg (ref 26.0–34.0)
MCV: 85.9 fL (ref 78.0–100.0)
RBC: 4.69 MIL/uL (ref 4.22–5.81)

## 2011-01-24 MED ORDER — ALUM & MAG HYDROXIDE-SIMETH 200-200-20 MG/5ML PO SUSP
30.0000 mL | Freq: Once | ORAL | Status: AC
  Administered 2011-01-24: 30 mL via ORAL
  Filled 2011-01-24: qty 30

## 2011-01-24 MED ORDER — IOHEXOL 300 MG/ML  SOLN
100.0000 mL | Freq: Once | INTRAMUSCULAR | Status: AC | PRN
  Administered 2011-01-24: 100 mL via INTRAVENOUS

## 2011-01-24 MED ORDER — GI COCKTAIL ~~LOC~~
ORAL | Status: AC
  Administered 2011-01-24: 18:00:00
  Filled 2011-01-24: qty 30

## 2011-01-24 MED ORDER — SODIUM CHLORIDE 0.9 % IV BOLUS (SEPSIS)
1000.0000 mL | Freq: Once | INTRAVENOUS | Status: AC
  Administered 2011-01-24: 1000 mL via INTRAVENOUS

## 2011-01-24 NOTE — ED Provider Notes (Signed)
History     CSN: [id number]  Arrival date & time 01/24/11  1630   First MD Initiated Contact with Patient 01/24/11 1635      Chief Complaint  Patient presents with  . Excessive Sweating    lightheaded  . Weakness    (Consider location/radiation/quality/duration/timing/severity/associated sxs/prior treatment) Patient is a 40 y.o. male presenting with weakness. The history is provided by the patient. No language interpreter was used.  Weakness The primary symptoms include dizziness and nausea. Primary symptoms do not include loss of consciousness, visual change, focal weakness, loss of sensation, fever or vomiting. The symptoms began 1 to 2 hours ago. The symptoms are improving (Mild improvement in systems). The neurological symptoms are diffuse. Context: While walking.  Dizziness also occurs with nausea and weakness. Dizziness does not occur with vomiting.  Additional symptoms include weakness. Additional symptoms do not include lower back pain, leg pain or anxiety. Medical issues also include recent surgery (Recent gastric ulcer with GI bleeding). Medical issues do not include seizures, alcohol use or drug use.    Past Medical History  Diagnosis Date  . GERD (gastroesophageal reflux disease)   . Upper GI bleed     Past Surgical History  Procedure Date  . Oversewing of gastric ulcer   . Abdominal surgery     No family history on file.  History  Substance Use Topics  . Smoking status: Never Smoker   . Smokeless tobacco: Not on file  . Alcohol Use: Yes     occasional      Review of Systems  Constitutional: Negative for fever.  Respiratory: Negative for cough and shortness of breath.   Cardiovascular: Negative for chest pain.  Gastrointestinal: Positive for nausea. Negative for vomiting.  Neurological: Positive for dizziness and weakness. Negative for focal weakness and loss of consciousness.  All other systems reviewed and are negative.    Allergies   Reglan  Home Medications   Current Outpatient Rx  Name Route Sig Dispense Refill  . FERROUS FUMARATE 325 (106 FE) MG PO TABS Oral Take 1 tablet by mouth 2 (two) times daily.      . OXYCODONE-ACETAMINOPHEN 5-325 MG PO TABS Oral Take 1 tablet by mouth every 4 (four) hours as needed.      Marland Kitchen PANTOPRAZOLE SODIUM 40 MG PO TBEC Oral Take 40 mg by mouth 2 (two) times daily.        BP 120/74  Pulse 99  Temp(Src) 98.6 F (37 C) (Oral)  Resp 18  SpO2 94%  Physical Exam  Constitutional: He is oriented to person, place, and time. He appears well-developed and well-nourished. No distress.  HENT:  Head: Normocephalic and atraumatic.  Mouth/Throat: No oropharyngeal exudate.  Eyes: EOM are normal. Pupils are equal, round, and reactive to light.  Neck: Normal range of motion. Neck supple.  Cardiovascular: Normal rate and regular rhythm.  Exam reveals no friction rub.   No murmur heard. Pulmonary/Chest: Effort normal and breath sounds normal. No respiratory distress. He has no wheezes. He has no rales.  Abdominal: Soft. He exhibits no distension. There is tenderness (mild, over scar). There is no rebound.       Well-healing midline scar.  Genitourinary: Guaiac negative stool.  Musculoskeletal: Normal range of motion. He exhibits no edema.  Neurological: He is alert and oriented to person, place, and time.  Skin: He is not diaphoretic.    ED Course  Procedures (including critical care time)   Labs Reviewed  CBC  COMPREHENSIVE METABOLIC  PANEL  POCT OCCULT BLOOD STOOL, DEVICE  TROPONIN I   Dg Chest 2 View  01/24/2011  *RADIOLOGY REPORT*  Clinical Data: Dizziness, nausea.  CHEST - 2 VIEW  Comparison: 09/28/2010  Findings: Heart and mediastinal contours are within normal limits. No focal opacities or effusions.  No acute bony abnormality.  IMPRESSION: No active cardiopulmonary disease.  Original Report Authenticated By: Cyndie Chime, M.D.   Ct Abdomen Pelvis W Contrast  01/24/2011   *RADIOLOGY REPORT*  Clinical Data:  Pain, diaphoresis, weakness, mid abdominal and bilateral lower abdominal pain, nausea, lightheaded, recent surgery for bleeding ulcers at the esophagus  CT ABDOMEN AND PELVIS WITH CONTRAST  Technique:  Multidetector CT imaging of the abdomen and pelvis was performed following the standard protocol during bolus administration of intravenous contrast. Sagittal and coronal MPR images reconstructed from axial data set.  Contrast: OMNIPAQUE IOHEXOL 300 MG/ML IV SOLN; Dilute oral contrast.  Comparison: None  Findings: Minimal dependent density at lung bases. Small collapsed hiatal hernia questioned. Mild thickening of the gastric cardia may be related to recent surgery. Liver, spleen, pancreas, kidneys, and adrenal glands normal appearance. Normal appendix. Thickening of anterior abdominal wall fascia and soft tissue planes likely related to recent surgery, without discrete fluid collection. Observed areas measure up to 2.7 cm thick and 5.6 cm transverse, 12.3 cm length. Stomach and bowel loops normal appearance. Unremarkable bladder, prostate gland and ureters. No mass, adenopathy, free fluid inflammatory process. Retroaortic left renal vein. Osteoarthritic changes left hip.  IMPRESSION: No acute intra abdominal or intrapelvic process identified. Postsurgical changes at the intra abdominal wall. Gastric wall thickening at cardia with probable small collapsed hiatal hernia.  Original Report Authenticated By: Lollie Marrow, M.D.     1. Dizziness   2. Nausea   3. Abdominal pain    Comprehensive metabolic panel (Final result)  Abnormal  Component (Lab Inquiry)      Result Time  NA  K  CL  CO2  GLUCOSE    01/24/11 1755  136  4.6 SLIGHT HEMOLYSIS  102  23  111 (H)           Result Time  BUN  Creatinine, Ser  CALCIUM  PROTEIN  Albumin    01/24/11 1755  13  0.77  9.2  7.4  3.7           Result Time  AST  ALT  ALK PHOS  BILI TOTL  GFR calc non Af Amer    01/24/11  1755  28 HEMOLYSIS AT THIS LEVEL MAY AFFECT RESULT  20 HEMOLYSIS AT THIS LEVEL MAY AFFECT RESULT  62 HEMOLYSIS AT THIS LEVEL MAY AFFECT RESULT  0.2 (L)  >90           Result Time  GFR calc Af Amer    01/24/11 1755  >90 The eGFR has been calculated using the CKD EPI equation. This calculation has not been validated in all clinical situations. eGFR's persistently <90 mL/min signify possible Chronic Kidney Disease.         Troponin I (Final result)   Component (Lab Inquiry)      Result Time  TROPONIN I    01/24/11 1752  <0.30 Due to the release kinetics of cTnI, a negative result within the first hours of the onset of symptoms does not rule out myocardial infarction with certainty. If myocardial infarction is still suspected, repeat the test at appropriate intervals.         CBC (Final result)  Abnormal  Component (Lab Inquiry)      Result Time  WBC  RBC  HGB  HCT  MCV    01/24/11 1721  12.1 (H)  4.69  13.6  40.3  85.9           Result Time  MCH  MCHC  RDW  PLT    01/24/11 1721  29.0  33.7  14.4  343     CT Abdomen Pelvis W Contrast (Final result)   Result time:01/24/11 1943    Final result by Rad Results In Interface (01/24/11 19:43:29)    Narrative:   *RADIOLOGY REPORT*  Clinical Data: Pain, diaphoresis, weakness, mid abdominal and bilateral lower abdominal pain, nausea, lightheaded, recent surgery for bleeding ulcers at the esophagus  CT ABDOMEN AND PELVIS WITH CONTRAST  Technique: Multidetector CT imaging of the abdomen and pelvis was performed following the standard protocol during bolus administration of intravenous contrast. Sagittal and coronal MPR images reconstructed from axial data set.  Contrast: OMNIPAQUE IOHEXOL 300 MG/ML IV SOLN; Dilute oral contrast.  Comparison: None  Findings: Minimal dependent density at lung bases. Small collapsed hiatal hernia questioned. Mild thickening of the gastric cardia may be related to recent surgery. Liver,  spleen, pancreas, kidneys, and adrenal glands normal appearance. Normal appendix. Thickening of anterior abdominal wall fascia and soft tissue planes likely related to recent surgery, without discrete fluid collection. Observed areas measure up to 2.7 cm thick and 5.6 cm transverse, 12.3 cm length. Stomach and bowel loops normal appearance. Unremarkable bladder, prostate gland and ureters. No mass, adenopathy, free fluid inflammatory process. Retroaortic left renal vein. Osteoarthritic changes left hip.  IMPRESSION: No acute intra abdominal or intrapelvic process identified. Postsurgical changes at the intra abdominal wall. Gastric wall thickening at cardia with probable small collapsed hiatal hernia.  Original Report Authenticated By: Lollie Marrow, M.D.            DG Chest 2 View (Final result)   Result time:01/24/11 [phone]    Final result by Rad Results In Interface (01/24/11 17:47:53)    Narrative:   *RADIOLOGY REPORT*  Clinical Data: Dizziness, nausea.  CHEST - 2 VIEW  Comparison: 09/28/2010  Findings: Heart and mediastinal contours are within normal limits. No focal opacities or effusions. No acute bony abnormality.  IMPRESSION: No active cardiopulmonary disease.  Original Report Authenticated By: Cyndie Chime, M.D.           EKG with normal sinus rhythm, no acute ischemia. No STEMI. MDM  63M p/w new onset dizziness/lightheadedness while [place] the [place] Similar symptoms when he had GI bleed 2/2 peptic ulcers requiring surgery 3 months ago. Denies CP, dyspnea, rectal bleeding, vomiting. Mild nausea.  AFVSS on arrival. Exam benign. Neuro intact, no gross deficits. Lungs clear. Abdomen soft, mild tenderness over old scar. Hemoccult negative. Will obtain EKG, CXR, labs. Fluids given.  EKG without acute ischemia. Normal sinus rhythm. CBC with stable hemoglobin, troponin negative. CXR normal. With continued abdominal tenderness, with check CT Abd/Pelvis  since he has recent surgery for life-threatening GI bleed. CT without acute findings. Unclear etiology of dizziness. Improved with fluids. Normal EKG, no hx, unlikely 2/2 cardiac disease. Normal Hgb. Patient improved after fluids, easily ambulatory. Discharged home in stable condition, instructed to f/u with PCP in next few days.        Elwin Mocha, MD 01/25/11 [phone]

## 2011-01-24 NOTE — ED Notes (Signed)
[place] in the mall and had sudden onset of nausea, jittering, lightheadedness and weakness.  No pain.  Pt was just in hospital in sept 2012 for bleeding ulcers at end of esophagus and had to have emergent abd surgery.  Pt is alert and talking

## 2011-01-26 NOTE — ED Provider Notes (Signed)
I saw and evaluated the patient, reviewed the resident's note and I agree with the findings and plan.  The patient presented complaining of near syncope, feeling sweaty.  He has similar symptoms a few months ago with a bleeding ulcer that required surgery.  He denies chest pain or abd pain.  One exam his heart and lungs are normal, and the abdomen is benign.  Labs failed to show anemia and rectal exam was heme negative.  A ct was obtained which was unremarkable.  Will discharge to home as no emergent cause of his symptoms was found.  Geoffery Lyons, MD 01/26/11 [phone]

## 2011-05-10 ENCOUNTER — Emergency Department (HOSPITAL_COMMUNITY)
Admission: EM | Admit: 2011-05-10 | Discharge: 2011-05-10 | Disposition: A | Discharge status: Home / Self Care | Payer: 59 | Attending: Emergency Medicine | Admitting: Emergency Medicine

## 2011-05-10 ENCOUNTER — Encounter (HOSPITAL_COMMUNITY): Payer: Self-pay | Admitting: *Deleted

## 2011-05-10 ENCOUNTER — Emergency Department (HOSPITAL_COMMUNITY): Payer: 59

## 2011-05-10 DIAGNOSIS — R6883 Chills (without fever): Secondary | ICD-10-CM | POA: Insufficient documentation

## 2011-05-10 DIAGNOSIS — R42 Dizziness and giddiness: Secondary | ICD-10-CM | POA: Insufficient documentation

## 2011-05-10 DIAGNOSIS — K439 Ventral hernia without obstruction or gangrene: Secondary | ICD-10-CM | POA: Insufficient documentation

## 2011-05-10 DIAGNOSIS — R110 Nausea: Secondary | ICD-10-CM | POA: Insufficient documentation

## 2011-05-10 DIAGNOSIS — K219 Gastro-esophageal reflux disease without esophagitis: Secondary | ICD-10-CM | POA: Insufficient documentation

## 2011-05-10 DIAGNOSIS — R109 Unspecified abdominal pain: Secondary | ICD-10-CM | POA: Insufficient documentation

## 2011-05-10 DIAGNOSIS — Z79899 Other long term (current) drug therapy: Secondary | ICD-10-CM | POA: Insufficient documentation

## 2011-05-10 LAB — CBC
MCHC: 34.3 g/dL (ref 30.0–36.0)
MCV: 89.7 fL (ref 78.0–100.0)
Platelets: 285 10*3/uL (ref 150–400)
RDW: 13.4 % (ref 11.5–15.5)
WBC: 12.6 10*3/uL — ABNORMAL HIGH (ref 4.0–10.5)

## 2011-05-10 LAB — URINALYSIS, ROUTINE W REFLEX MICROSCOPIC
Bilirubin Urine: NEGATIVE
Glucose, UA: NEGATIVE mg/dL
Hgb urine dipstick: NEGATIVE
Ketones, ur: NEGATIVE mg/dL
Leukocytes, UA: NEGATIVE
Nitrite: NEGATIVE
Protein, ur: NEGATIVE mg/dL
Specific Gravity, Urine: 1.019 (ref 1.005–1.030)
Urobilinogen, UA: 0.2 mg/dL (ref 0.0–1.0)
pH: 6 (ref 5.0–8.0)

## 2011-05-10 LAB — DIFFERENTIAL
Basophils Absolute: 0 10*3/uL (ref 0.0–0.1)
Basophils Relative: 0 % (ref 0–1)
Eosinophils Relative: 1 % (ref 0–5)
Lymphocytes Relative: 21 % (ref 12–46)
Neutro Abs: 9.1 10*3/uL — ABNORMAL HIGH (ref 1.7–7.7)

## 2011-05-10 LAB — COMPREHENSIVE METABOLIC PANEL WITH GFR
ALT: 35 U/L (ref 0–53)
AST: 30 U/L (ref 0–37)
Albumin: 4.1 g/dL (ref 3.5–5.2)
Alkaline Phosphatase: 63 U/L (ref 39–117)
BUN: 14 mg/dL (ref 6–23)
CO2: 24 meq/L (ref 19–32)
Calcium: 9.4 mg/dL (ref 8.4–10.5)
Chloride: 100 meq/L (ref 96–112)
Creatinine, Ser: 0.88 mg/dL (ref 0.50–1.35)
GFR calc Af Amer: >90 mL/min (ref >90)
GFR calc non Af Amer: >90 mL/min (ref >90)
Glucose, Bld: 100 mg/dL — ABNORMAL HIGH (ref 70–99)
Potassium: 4.4 meq/L (ref 3.5–5.1)
Sodium: 136 meq/L (ref 135–145)
Total Bilirubin: 0.2 mg/dL — ABNORMAL LOW (ref 0.3–1.2)
Total Protein: 7 g/dL (ref 6.0–8.3)

## 2011-05-10 LAB — LIPASE, BLOOD: Lipase: 58 U/L (ref 11–59)

## 2011-05-10 MED ORDER — HYDROMORPHONE HCL PF 1 MG/ML IJ SOLN
1.0000 mg | Freq: Once | INTRAMUSCULAR | Status: AC
  Administered 2011-05-10: 1 mg via INTRAVENOUS
  Filled 2011-05-10: qty 1

## 2011-05-10 MED ORDER — SUCRALFATE 1 GM/10ML PO SUSP: 1.0000 g | Freq: Four times a day (QID) | ORAL | Status: DC | PRN

## 2011-05-10 MED ORDER — SODIUM CHLORIDE 0.9 % IV BOLUS (SEPSIS)
1000.0000 mL | Freq: Once | INTRAVENOUS | Status: AC
  Administered 2011-05-10: 1000 mL via INTRAVENOUS

## 2011-05-10 MED ORDER — SUCRALFATE 1 GM/10ML PO SUSP: 1.0000 g | Freq: Four times a day (QID) | ORAL | Status: DC

## 2011-05-10 MED ORDER — KETOROLAC TROMETHAMINE 30 MG/ML IJ SOLN
30.0000 mg | Freq: Once | INTRAMUSCULAR | Status: AC
  Administered 2011-05-10: 30 mg via INTRAVENOUS
  Filled 2011-05-10: qty 1

## 2011-05-10 MED ORDER — ONDANSETRON HCL 4 MG/2ML IJ SOLN
4.0000 mg | Freq: Once | INTRAMUSCULAR | Status: AC
  Administered 2011-05-10: 4 mg via INTRAVENOUS
  Filled 2011-05-10: qty 2

## 2011-05-10 MED ORDER — IOHEXOL 300 MG/ML  SOLN
100.0000 mL | Freq: Once | INTRAMUSCULAR | Status: AC | PRN
  Administered 2011-05-10: 100 mL via INTRAVENOUS

## 2011-05-10 NOTE — ED Notes (Signed)
abd  Pain for one week with nausea no diarrhea

## 2011-05-10 NOTE — ED Notes (Addendum)
Episode of SOB, chills and fainting after zofran and dilaudid Iv was given, MD notified came to room to assess pt. VSS,

## 2011-05-10 NOTE — Discharge Instructions (Signed)
Hernia  A hernia occurs when an internal organ pushes out through a weak spot in the abdominal wall. Hernias most commonly occur in the groin and around the navel. Hernias often can be pushed back into place (reduced). Most hernias tend to get worse over time. Some abdominal hernias can get stuck in the opening (irreducible or incarcerated hernia) and cannot be reduced. An irreducible abdominal hernia which is tightly squeezed into the opening is at risk for impaired blood supply (strangulated hernia). A strangulated hernia is a medical emergency. Because of the risk for an irreducible or strangulated hernia, surgery may be recommended to repair a hernia.  CAUSES    Heavy lifting.   Prolonged coughing.   Straining to have a bowel movement.   A cut (incision) made during an abdominal surgery.  HOME CARE INSTRUCTIONS    Bed rest is not required. You may continue your normal activities.   Avoid lifting more than 10 pounds (4.5 kg) or straining.   Cough gently. If you are a smoker it is best to stop. Even the best hernia repair can break down with the continual strain of coughing. Even if you do not have your hernia repaired, a cough will continue to aggravate the problem.   Do not wear anything tight over your hernia. Do not try to keep it in with an outside bandage or truss. These can damage abdominal contents if they are trapped within the hernia sac.   Eat a normal diet.   Avoid constipation. Straining over long periods of time will increase hernia size and encourage breakdown of repairs. If you cannot do this with diet alone, stool softeners may be used.  SEEK IMMEDIATE MEDICAL CARE IF:    You have a fever.   You develop increasing abdominal pain.   You feel nauseous or vomit.   Your hernia is stuck outside the abdomen, looks discolored, feels hard, or is tender.   You have any changes in your bowel habits or in the hernia that are unusual for you.   You have increased pain or swelling around the  hernia.   You cannot push the hernia back in place by applying gentle pressure while lying down.  MAKE SURE YOU:    Understand these instructions.   Will watch your condition.   Will get help right away if you are not doing well or get worse.  Document Released: 01/19/2005 Document Revised: 01/08/2011 Document Reviewed: 09/08/2007  Exit[employer] Patient Information 2012 [employer].

## 2011-05-10 NOTE — ED Notes (Signed)
Denies any need, stated understanding to dc instruction

## 2011-05-10 NOTE — ED Notes (Signed)
CT notified pt is ready for transport. 

## 2011-05-10 NOTE — ED Notes (Signed)
Started feeling chills, HA, dizziness  and lightheaded around 2100,  Took some tylenol and it did not go away. Off and on diarrhea since last week. Stated some SOB with activity. Abdomen pain x2days, s/p abdomen surgery 10/2010, now noted some hernia left mid abdomen area about a month ago.

## 2011-05-10 NOTE — ED Notes (Signed)
Pt awake and alert, stated feeling much better now, VSS, stated relief with med. Family at bedside.

## 2011-05-10 NOTE — ED Provider Notes (Signed)
History     CSN: [id number]  Arrival date & time 05/10/11  0023   First MD Initiated Contact with Patient 05/10/11 0248      Chief Complaint  Patient presents with  . Abdominal Pain    (Consider location/radiation/quality/duration/timing/severity/associated sxs/prior treatment) HPI The patient presents with abdominal pain.  Notably, the patient has a history of perforated peptic ulcer, which required surgical repair 6 months ago.  He notes over the past few days he's had increasing pain focally about the left superior abdomen.  Approximately 5 hours ago this pain increased and he developed chills, lightheadedness, nausea.  He has not taken any medication to control the symptoms.  No clear exacerbating factors either Past Medical History  Diagnosis Date  . GERD (gastroesophageal reflux disease)   . Upper GI bleed     Past Surgical History  Procedure Date  . Oversewing of gastric ulcer   . Abdominal surgery     No family history on file.  History  Substance Use Topics  . Smoking status: Never Smoker   . Smokeless tobacco: Not on file  . Alcohol Use: Yes     occasional      Review of Systems  Constitutional:       Per HPI, otherwise negative  HENT:       Per HPI, otherwise negative  Eyes: Negative.   Respiratory:       Per HPI, otherwise negative  Cardiovascular:       Per HPI, otherwise negative  Gastrointestinal: Negative for vomiting.  Genitourinary: Negative.   Musculoskeletal:       Per HPI, otherwise negative  Skin: Negative.   Neurological: Negative for syncope.    Allergies  Reglan  Home Medications   Current Outpatient Rx  Name Route Sig Dispense Refill  . ACETAMINOPHEN 325 MG PO TABS Oral Take by mouth every 6 (six) hours as needed. pain    . FERROUS FUMARATE 325 (106 FE) MG PO TABS Oral Take 1 tablet by mouth 2 (two) times daily.      Marland Kitchen PANTOPRAZOLE SODIUM 40 MG PO TBEC Oral Take 40 mg by mouth 2 (two) times daily.        BP 137/82  Pulse  106  Temp(Src) 98.4 F (36.9 C) (Oral)  Resp 16  SpO2 97%  Physical Exam  Nursing note and vitals reviewed. Constitutional: He is oriented to person, place, and time. He appears well-developed. No distress.  HENT:  Head: Normocephalic and atraumatic.  Eyes: Conjunctivae and EOM are normal.  Cardiovascular: Normal rate and regular rhythm.   Pulmonary/Chest: Effort normal. No stridor. No respiratory distress.  Abdominal: Normal appearance. He exhibits no distension. There is tenderness in the epigastric area and left upper quadrant. There is guarding. There is no rigidity and no rebound.    Musculoskeletal: He exhibits no edema.  Neurological: He is alert and oriented to person, place, and time.  Skin: Skin is warm and dry.  Psychiatric: He has a normal mood and affect.    ED Course  Procedures (including critical care time)  Labs Reviewed  CBC - Abnormal; Notable for the following:    WBC 12.6 (*)    All other components within normal limits  DIFFERENTIAL - Abnormal; Notable for the following:    Neutro Abs 9.1 (*)    All other components within normal limits  URINALYSIS, ROUTINE W REFLEX MICROSCOPIC  COMPREHENSIVE METABOLIC PANEL  LIPASE, BLOOD   No results found.   No diagnosis found.  Cardiac monitor ED sinus rhythm normal Pulse oximetry 99% on room air normal   MDM  This 41-year-old male with relatively recent repair of perforated ulcer now presents with abdominal pain.  On exam the patient is in no distress, though he is uncomfortable with palpation of his surgical site, where there is a discernible hernia.  Given the patient's discomfort, the palpable hernia, his recent surgery there is concern for SBO or abscess.  The patient's labs are notable for mild leukocytosis, though the patient is afebrile.  The patient's CT did not demonstrate acute findings, but does note both ventral hernia and hiatal hernia.  The patient had significant improvement in his condition with  ED interventions.  He was discharged in stable condition to follow up with his surgeon: A prolonged discussion of hernias, abdominal pain, return precautions, the need for ongoing management of his condition.      Gerhard Munch, MD 05/10/11 [phone]

## 2011-05-26 ENCOUNTER — Ambulatory Visit (INDEPENDENT_AMBULATORY_CARE_PROVIDER_SITE_OTHER): Payer: 59 | Admitting: Family Medicine

## 2011-05-26 ENCOUNTER — Encounter: Payer: Self-pay | Admitting: Family Medicine

## 2011-05-26 VITALS — BP 130/92 | HR 76 | Temp 98.3°F | Ht 68.25 in | Wt 235.0 lb

## 2011-05-26 DIAGNOSIS — Z136 Encounter for screening for cardiovascular disorders: Secondary | ICD-10-CM

## 2011-05-26 DIAGNOSIS — K432 Incisional hernia without obstruction or gangrene: Secondary | ICD-10-CM

## 2011-05-26 DIAGNOSIS — K439 Ventral hernia without obstruction or gangrene: Secondary | ICD-10-CM

## 2011-05-26 DIAGNOSIS — F419 Anxiety disorder, unspecified: Secondary | ICD-10-CM

## 2011-05-26 DIAGNOSIS — Z833 Family history of diabetes mellitus: Secondary | ICD-10-CM

## 2011-05-26 DIAGNOSIS — Z8719 Personal history of other diseases of the digestive system: Secondary | ICD-10-CM

## 2011-05-26 DIAGNOSIS — F411 Generalized anxiety disorder: Secondary | ICD-10-CM

## 2011-05-26 HISTORY — DX: Incisional hernia without obstruction or gangrene: K43.2

## 2011-05-26 LAB — CBC WITH DIFFERENTIAL/PLATELET
Basophils Relative: 0.3 % (ref 0.0–3.0)
Eosinophils Absolute: 0.1 10*3/uL (ref 0.0–0.7)
Eosinophils Relative: 1.4 % (ref 0.0–5.0)
HCT: 45.1 % (ref 39.0–52.0)
Hemoglobin: 15.1 g/dL (ref 13.0–17.0)
Lymphs Abs: 2.1 10*3/uL (ref 0.7–4.0)
MCHC: 33.5 g/dL (ref 30.0–36.0)
MCV: 91.3 fl (ref 78.0–100.0)
Monocytes Absolute: 0.8 10*3/uL (ref 0.1–1.0)
Neutro Abs: 6 10*3/uL (ref 1.4–7.7)
Neutrophils Relative %: 66.1 % (ref 43.0–77.0)
RBC: 4.95 Mil/uL (ref 4.22–5.81)
WBC: 9.1 10*3/uL (ref 4.5–10.5)

## 2011-05-26 LAB — LIPID PANEL
Cholesterol: 171 mg/dL (ref 0–200)
HDL: 63.9 mg/dL (ref >39.00)
LDL Cholesterol: 89 mg/dL (ref 0–99)
Total CHOL/HDL Ratio: 3
Triglycerides: 89 mg/dL (ref 0.0–149.0)

## 2011-05-26 LAB — COMPREHENSIVE METABOLIC PANEL
AST: 29 U/L (ref 0–37)
Albumin: 4.7 g/dL (ref 3.5–5.2)
Alkaline Phosphatase: 46 U/L (ref 39–117)
BUN: 13 mg/dL (ref 6–23)
Creatinine, Ser: 1.1 mg/dL (ref 0.4–1.5)
Glucose, Bld: 94 mg/dL (ref 70–99)

## 2011-05-26 MED ORDER — ALPRAZOLAM 0.25 MG PO TABS
0.2500 mg | ORAL_TABLET | Freq: Three times a day (TID) | ORAL | Status: DC | PRN
Start: 2011-05-26 — End: 2011-06-08

## 2011-05-26 NOTE — Progress Notes (Signed)
Subjective:     Patient ID: [name], male   DOB: 1970-02-26, 41 y.o.   MRN: [id number]  HPI Very pleasant 41 year old male here to establish care.  Since September of 2012 has had a difficult time. Initially had a very large upper GIB.  Had a laparotomy with oversewing of a gastric ulcer (likely due to NSAID use) which then required re opening and drainage.   Now has a ventral hernia which has been increasing in size and becoming increasingly painful. Went to ER on 4/7 due to hernia and pain. Notes reviewed.  CT of abd/pelvis showed:  Left paramidline ventral hernia containing loops of nondilated  small bowel. No evidence of bowel obstruction.  Appt scheduled with Dr. Luisa Hart for next week but pt is concerned as size and pain is increasing. Very painful to have bowel movement.  No blood in stool, bowels are otherwise normal. No fevers, no nausea, no vomiting.  He also has noticed some ? Panic attacks.  No h/o anxiety or depression.  Since his GIB he finds himself getting anxious and light headed.  Has to sit down and take deep breaths. Feels that he is worried that he will pass out again and have a massive bleed.  Patient Active Problem List  Diagnoses  . Gastric ulcer  . DJD (degenerative joint disease)  . Ventral hernia  . H/O: upper GI bleed   Past Medical History  Diagnosis Date  . GERD (gastroesophageal reflux disease)   . Upper GI bleed    Past Surgical History  Procedure Date  . Oversewing of gastric ulcer   . Abdominal surgery    History  Substance Use Topics  . Smoking status: Never Smoker   . Smokeless tobacco: Not on file  . Alcohol Use: Yes     occasional   No family history on file. Allergies  Allergen Reactions  . Reglan Hives, Swelling and Rash    Swelling in lips  . Dilaudid (Hydromorphone Hcl)     Shakey, rash   Current Outpatient Prescriptions on File Prior to Visit  Medication Sig Dispense Refill  . acetaminophen (TYLENOL) 325 MG  tablet Take by mouth every 6 (six) hours as needed. pain      . ferrous fumarate (HEMOCYTE - 106 MG FE) 325 (106 FE) MG TABS Take 1 tablet by mouth 2 (two) times daily.        . pantoprazole (PROTONIX) 40 MG tablet Take 40 mg by mouth 2 (two) times daily.        . sucralfate (CARAFATE) 1 GM/10ML suspension Take 10 mLs (1 g total) by mouth every 6 (six) hours as needed (abdominal pain with nausea).  420 mL  0   The PMH, PSH, Social History, Family History, Medications, and allergies have been reviewed in [facility], and have been updated if relevant.  ROS: See HPI Patient reports no  vision/ hearing changes,anorexia, weight change, fever ,adenopathy, gastrointestinal  bleeding (melena, rectal bleeding),  excessive heart burn, GU symptoms(dysuria, hematuria, pyuria, voiding/incontinence  Issues) syncope, focal weakness, severe memory loss, concerning skin lesions, depression,  abnormal bruising/bleeding, major joint swelling.       Objective:   Physical Exam BP 130/92  Pulse 76  Temp(Src) 98.3 F (36.8 C) (Oral)  Ht 5' 8.25" (1.734 m)  Wt 235 lb (106.595 kg)  BMI 35.47 kg/m2 General:  Pleasant male in NAD Eyes:  PERRL Ears:  External ear exam shows no significant lesions or deformities.  Otoscopic examination reveals clear  canals, tympanic membranes are intact bilaterally without bulging, retraction, inflammation or discharge. Hearing is grossly normal bilaterally. Nose:  External nasal examination shows no deformity or inflammation. Nasal mucosa are pink and moist without lesions or exudates. Mouth:  Oral mucosa and oropharynx without lesions or exudates.  Teeth in good repair. Neck:  no carotid bruit or thyromegaly no cervical or supraclavicular lymphadenopathy  Lungs:  Normal respiratory effort, chest expands symmetrically. Lungs are clear to auscultation, no crackles or wheezes. Heart:  Normal rate and regular rhythm. S1 and S2 normal without gallop, murmur, click, rub or other extra  sounds. Abdomen :midline scar well, healed,  large left paramidline ventral hernia, TTP organomegaly or hernias noted. Pulses:  R and L posterior tibial pulses are full and equal bilaterally  Extremities:  no edema      Assessment/Plan:   1. Ventral hernia  Deteriorated.  Given increased pain and size will contact [facility] to ask if they can see him sooner.  No incarceration on CT scan but per pt, pain and size increased since CT scan. Ambulatory referral to General Surgery  2. H/O: upper GI bleed  Continue protonix 40 mg twice daily  CBC with Differential, Comprehensive metabolic panel  3. Family history of diabetes mellitus  HgB A1c  4. Screening for ischemic heart disease  Lipid Panel  5. Anxiety  Likely post stress reaction. Given rx for xanax to use as needed- discussed sedation and addiction potential.

## 2011-05-26 NOTE — Patient Instructions (Signed)
Nice to meet you. Please say hello to your lovely family for me. We will call you with your lab results from today. Please stop by to see Shirlee Limerick on your way out.

## 2011-05-27 ENCOUNTER — Other Ambulatory Visit: Payer: Self-pay

## 2011-05-27 DIAGNOSIS — K922 Gastrointestinal hemorrhage, unspecified: Secondary | ICD-10-CM

## 2011-05-27 DIAGNOSIS — K279 Peptic ulcer, site unspecified, unspecified as acute or chronic, without hemorrhage or perforation: Secondary | ICD-10-CM

## 2011-05-27 DIAGNOSIS — M1990 Unspecified osteoarthritis, unspecified site: Secondary | ICD-10-CM

## 2011-05-27 MED ORDER — PANTOPRAZOLE SODIUM 40 MG PO TBEC: 40.0000 mg | DELAYED_RELEASE_TABLET | Freq: Two times a day (BID) | ORAL | Status: DC

## 2011-05-27 NOTE — Telephone Encounter (Signed)
Pt request Protonix 40 mg #180 x 3 to [facility].pt notified done while on phone.

## 2011-05-29 ENCOUNTER — Encounter (INDEPENDENT_AMBULATORY_CARE_PROVIDER_SITE_OTHER): Payer: Self-pay | Admitting: General Surgery

## 2011-05-29 ENCOUNTER — Other Ambulatory Visit (INDEPENDENT_AMBULATORY_CARE_PROVIDER_SITE_OTHER): Payer: Self-pay

## 2011-05-29 ENCOUNTER — Ambulatory Visit (INDEPENDENT_AMBULATORY_CARE_PROVIDER_SITE_OTHER): Payer: Commercial Managed Care - PPO | Admitting: General Surgery

## 2011-05-29 ENCOUNTER — Telehealth: Payer: Self-pay

## 2011-05-29 VITALS — BP 133/91 | HR 94 | Temp 98.1°F | Ht 68.5 in | Wt 236.8 lb

## 2011-05-29 DIAGNOSIS — K469 Unspecified abdominal hernia without obstruction or gangrene: Secondary | ICD-10-CM

## 2011-05-29 DIAGNOSIS — K432 Incisional hernia without obstruction or gangrene: Secondary | ICD-10-CM

## 2011-05-29 NOTE — Telephone Encounter (Signed)
Pt saw Dr Dayton Martes on 05/26/11 for ventral hernia and saw Dr.Layton today who will do scope but advised pt to contact PCP for pain med. Pt said sharp pain on left side where hernia is; pain level now 6-7. Pt uses [facility] outpatient pharmacy and pt can be reached at [phone]. Since Dr Dayton Martes is not on computer today will you please advise.

## 2011-05-29 NOTE — Telephone Encounter (Signed)
Okay to send order for generic norco 5/325 1 every 4 hours prn #40 x 0

## 2011-05-29 NOTE — Progress Notes (Signed)
Patient ID: [name], male   DOB: 20-Jul-1970, 41 y.o.   MRN: [id number]  Chief Complaint  Patient presents with  . Pre-op Exam    eval hernia    HPI TRAQUAN [name] is a 41 y.o. male. This patient presents for evaluation of incisional hernia. He reports undergoing a laparotomy and oversewing of a bleeding gastric ulcer in September for an acute bleed. Ever since his surgery he has had pain and a bulge in the area of incision. His surgery is competent with a postoperative wound infection requiring opening of this incision and packing this wound. Since then he has had a "achy and sore" pain in his mid abdomen around the area of a notable bulge. He says he has nausea and frequent diarrhea as well. Currently he is on Protonix twice a day for treatment of this ulcer but he has not had any repeat endoscopy since his original surgery. He is not on any NSAIDS and denies any reflux HPI  Past Medical History  Diagnosis Date  . GERD (gastroesophageal reflux disease)   . Upper GI bleed   . Ventral hernia   . Hiatal hernia     Past Surgical History  Procedure Date  . Oversewing of gastric ulcer   . Abdominal surgery     Family History  Problem Relation Age of Onset  . Diabetes Mother   . Diabetes Father   . Heart disease Father     Social History History  Substance Use Topics  . Smoking status: Never Smoker   . Smokeless tobacco: Not on file  . Alcohol Use: Yes     occasional    Allergies  Allergen Reactions  . Reglan Hives, Swelling and Rash    Swelling in lips  . Dilaudid (Hydromorphone Hcl)     Shakey, rash    Current Outpatient Prescriptions  Medication Sig Dispense Refill  . acetaminophen (TYLENOL) 325 MG tablet Take by mouth every 6 (six) hours as needed. pain      . ALPRAZolam (XANAX) 0.25 MG tablet Take 1 tablet (0.25 mg total) by mouth 3 (three) times daily as needed for anxiety.  30 tablet  0  . ferrous fumarate (HEMOCYTE - 106 MG FE) 325 (106 FE) MG TABS Take 1  tablet by mouth 2 (two) times daily.        Marland Kitchen oxyCODONE-acetaminophen (PERCOCET) 5-325 MG per tablet Take 1-2 tablets by mouth at bedtime.      . pantoprazole (PROTONIX) 40 MG tablet Take 1 tablet (40 mg total) by mouth 2 (two) times daily.  180 tablet  3  . sucralfate (CARAFATE) 1 GM/10ML suspension Take 10 mLs (1 g total) by mouth every 6 (six) hours as needed (abdominal pain with nausea).  420 mL  0    Review of Systems Review of Systems All other review of systems negative or noncontributory except as stated in the HPI  Blood pressure 133/91, pulse 94, temperature 98.1 F (36.7 C), temperature source Temporal, height 5' 8.5" (1.74 m), weight 236 lb 12.8 oz (107.412 kg), SpO2 98.00%.  Physical Exam Physical Exam Physical Exam  Vitals reviewed. Constitutional: He is oriented to person, place, and time. He appears well-developed and well-nourished. No distress.  HENT:  Head: Normocephalic and atraumatic.  Mouth/Throat: No oropharyngeal exudate.  Eyes: Conjunctivae and EOM are normal. Pupils are equal, round, and reactive to light. Right eye exhibits no discharge. Left eye exhibits no discharge. No scleral icterus.  Neck: Normal range of motion.  No tracheal deviation present.  Cardiovascular: Normal rate, regular rhythm and normal heart sounds.   Pulmonary/Chest: Effort normal and breath sounds normal. No stridor. No respiratory distress. He has no wheezes. He has no rales. He exhibits no tenderness.  Abdominal: Soft. Bowel sounds are normal. He exhibits no distension and no mass. There is no tenderness. There is no rebound and no guarding. Large bulge to right of midline, c/w incisional hernia, tender to palpation but easily reducible. Musculoskeletal: Normal range of motion. He exhibits no edema and no tenderness.  Neurological: He is alert and oriented to person, place, and time.  Skin: Skin is warm and dry. No rash noted. He is not diaphoretic. No erythema. No pallor.  Psychiatric:  He has a normal mood and affect. His behavior is normal. Judgment and thought content normal.   Data Reviewed CT, op note  Assessment    Incisional hernia-reducible He has a moderate to large incisional hernia in the upper midline and just to the right of his midline. He is very symptomatic from this with tenderness. I think that we could certainly offer him repair but I did recommend that he have a repeat endoscopy for evaluation of healing of his ulcer prior to any procedure. I think that open repair be best for him. He is a fairly large defect and I think laparoscopic repair would not be ideal for this patient given the high midline location and the difficulty of fixation near the ribs as well as a single large central defect which might cause a residual bulge. We also discussed possible component separation technique for repair. I think that open incisional hernia Is going to give him the best results. We discussed the procedure and the postoperative course. We also discussed the risks of infection, bleeding, pain, scarring, recurrence, no injury, wound infection, need for placement of mesh, possible component separation. He expressed understanding and desire to proceed with incisional hernia repair.    Plan    We will proceed with open incisional hernia repair with mesh after the patient has had a followup EGD and we have documented healing of his gastric ulcer       [name] 05/29/2011, 2:10 PM

## 2011-05-29 NOTE — Telephone Encounter (Signed)
Rx called in and patient notified.  

## 2011-06-02 ENCOUNTER — Other Ambulatory Visit (INDEPENDENT_AMBULATORY_CARE_PROVIDER_SITE_OTHER): Payer: Self-pay | Admitting: Surgery

## 2011-06-02 ENCOUNTER — Encounter (INDEPENDENT_AMBULATORY_CARE_PROVIDER_SITE_OTHER): Payer: Self-pay | Admitting: Surgery

## 2011-06-08 ENCOUNTER — Other Ambulatory Visit: Payer: Self-pay

## 2011-06-08 MED ORDER — HYDROCODONE-ACETAMINOPHEN 5-325 MG PO TABS: 1.0000 | ORAL_TABLET | ORAL | Status: DC | PRN

## 2011-06-08 MED ORDER — ALPRAZOLAM 0.25 MG PO TABS: 0.2500 mg | ORAL_TABLET | Freq: Three times a day (TID) | ORAL | Status: DC | PRN

## 2011-06-08 NOTE — Telephone Encounter (Signed)
Medicines called to pharmacy- [facility].

## 2011-06-08 NOTE — Telephone Encounter (Signed)
Pt request refill called to [facility] outpt pharmacy for Alprazolam 0.25 mg and Hydrocodone-APAP 5-325 mg. Pt last seen 05/26/11.Pt can be reached at [phone].Please advise.

## 2011-06-18 ENCOUNTER — Other Ambulatory Visit: Payer: Self-pay

## 2011-06-18 MED ORDER — ALPRAZOLAM 0.25 MG PO TABS: 0.2500 mg | ORAL_TABLET | Freq: Three times a day (TID) | ORAL | Status: DC | PRN

## 2011-06-18 MED ORDER — HYDROCODONE-ACETAMINOPHEN 5-325 MG PO TABS: 1.0000 | ORAL_TABLET | ORAL | Status: DC | PRN

## 2011-06-18 NOTE — Telephone Encounter (Signed)
Medicines called to [facility].

## 2011-06-18 NOTE — Telephone Encounter (Signed)
Pt request refills on Alprazolam 0.25 mg and Hydrocodone apap 5-325 mg sent to [facility] pharmacy. Pt last seen 05/26/11. Pt can be reached at [phone].

## 2011-06-25 ENCOUNTER — Other Ambulatory Visit: Payer: Self-pay

## 2011-06-25 MED ORDER — HYDROCODONE-ACETAMINOPHEN 5-325 MG PO TABS: 1.0000 | ORAL_TABLET | ORAL | Status: DC | PRN

## 2011-06-25 NOTE — Telephone Encounter (Signed)
Pt request Hydrocodone apap called in to [facility]. Pt will be out of med this weekend.

## 2011-06-26 NOTE — Telephone Encounter (Signed)
Medicine called to [facility] pharmacy.

## 2011-07-01 ENCOUNTER — Other Ambulatory Visit: Payer: Self-pay | Admitting: Gastroenterology

## 2011-07-03 ENCOUNTER — Other Ambulatory Visit: Payer: Self-pay

## 2011-07-03 MED ORDER — HYDROCODONE-ACETAMINOPHEN 5-325 MG PO TABS: 1.0000 | ORAL_TABLET | ORAL | Status: DC | PRN

## 2011-07-03 NOTE — Telephone Encounter (Signed)
Pt request refill Hydrocodone-APAP to [facility]. Pt said in a lot of pain.Please advise.

## 2011-07-03 NOTE — Telephone Encounter (Signed)
Medicine called to pharmacy. 

## 2011-07-10 ENCOUNTER — Ambulatory Visit: Payer: 59 | Admitting: Family Medicine

## 2011-07-10 ENCOUNTER — Other Ambulatory Visit: Payer: Self-pay

## 2011-07-10 MED ORDER — HYDROCODONE-ACETAMINOPHEN 5-325 MG PO TABS: 1.0000 | ORAL_TABLET | ORAL | Status: DC | PRN

## 2011-07-10 MED ORDER — ALPRAZOLAM 0.25 MG PO TABS: 0.2500 mg | ORAL_TABLET | Freq: Three times a day (TID) | ORAL | Status: DC | PRN

## 2011-07-10 NOTE — Telephone Encounter (Signed)
Meds called to pharmacy.  I spoke with patient and he says he's in a lot of pain with his hernia.  Per Dr. Dayton Martes, will refill norco this time but pt will need to try to get surgical appt moved up.

## 2011-07-10 NOTE — Telephone Encounter (Signed)
Pt request refill Hydrocodone-APAP and alprazolam to [facility] outpt pharmacy. Pt has appt with Dr Dayton Martes this afternoon 4 pm but wants to pick up med before appt.Please advise.

## 2011-07-16 ENCOUNTER — Telehealth (INDEPENDENT_AMBULATORY_CARE_PROVIDER_SITE_OTHER): Payer: Self-pay

## 2011-07-16 NOTE — Telephone Encounter (Signed)
Return call to Mr. [name] -- Patient requesting earlier appointment for surgery, per Dr. Biagio Quint patient need's to have EGD and colonoscopy completed to verify that ulcer has resolved.  Patient has completed EGD and still need's colonoscopy.  Message will be given to Dr. Biagio Quint on 07/17/11, patient would like to speak with him in more detail.

## 2011-07-17 ENCOUNTER — Other Ambulatory Visit (INDEPENDENT_AMBULATORY_CARE_PROVIDER_SITE_OTHER): Payer: Self-pay | Admitting: General Surgery

## 2011-07-23 ENCOUNTER — Other Ambulatory Visit: Payer: Self-pay

## 2011-07-23 MED ORDER — HYDROCODONE-ACETAMINOPHEN 5-325 MG PO TABS: 1.0000 | ORAL_TABLET | ORAL | Status: DC | PRN

## 2011-07-23 NOTE — Telephone Encounter (Signed)
Pt request refill Hydrocodone-APAP sent to [facility].Please advise. Pt wanted Dr Dayton Martes to know has surgery date for 09/04/11.

## 2011-07-23 NOTE — Telephone Encounter (Signed)
Pt called said rx not at pharmacy. Lisa at [facility] said did receive be ready in 15 mins. Pt advised.

## 2011-07-23 NOTE — Telephone Encounter (Signed)
Medicine called to [facility] outpatient pharmacy. 

## 2011-07-30 ENCOUNTER — Ambulatory Visit (INDEPENDENT_AMBULATORY_CARE_PROVIDER_SITE_OTHER): Payer: Commercial Managed Care - PPO | Admitting: General Surgery

## 2011-07-30 ENCOUNTER — Encounter (INDEPENDENT_AMBULATORY_CARE_PROVIDER_SITE_OTHER): Payer: Self-pay | Admitting: General Surgery

## 2011-07-30 VITALS — BP 156/98 | HR 92 | Temp 98.2°F | Ht 69.0 in | Wt 230.6 lb

## 2011-07-30 DIAGNOSIS — K439 Ventral hernia without obstruction or gangrene: Secondary | ICD-10-CM

## 2011-07-30 NOTE — Progress Notes (Signed)
Subjective:     Patient ID: [name], male   DOB: [birth year], 41 y.o.   MRN: [id number]  HPI As this patient has a doctor repeat EGD to evaluate for resolution of his peptic ulcer. She did undergo EGD in May and this was felt to be essentially normal. Biopsies negative. I spoke with the endoscopist and he says that he should be okay for surgery. He is interested in moving up his surgery date because he still has significant discomfort in the upper abdomen.  Review of Systems     Objective:   Physical Exam He has a fairly large midline hernia with reducible contents. This is mainly on the left side. It is right up under his xiphoid and costal margin. Again, there is no evidence of incarceration    Assessment:     Incisional hernia He has a fairly symptomatic incisional hernia and he is interested in repair of this as soon as possible. I have recommended open incisional hernia repair with possible component separation. I discussed with him the risks and benefits of the procedure and the alternatives of continued observation versus laparoscopic ventral hernia repair. Again, I think that open ventral hernia repair with possible component separation will be probably the best functional outcome for him. I discussed with him the risks including infection, bleeding, pain, scarring, mesh infection, recurrence, persistent pain and symptoms, need for component separation, and nerve injury and he expressed understanding and desires to proceed with the planned procedure.    Plan:     I will plan on proceeding with open incisional hernia repair with mesh with likely laparoscopic component separation

## 2011-07-31 ENCOUNTER — Other Ambulatory Visit: Payer: Self-pay

## 2011-07-31 MED ORDER — HYDROCODONE-ACETAMINOPHEN 5-325 MG PO TABS: 1.0000 | ORAL_TABLET | ORAL | Status: DC | PRN

## 2011-07-31 NOTE — Telephone Encounter (Signed)
Norco called to pharmacy. 

## 2011-07-31 NOTE — Telephone Encounter (Signed)
Pt request refill Hydrocodone-APAP to [facility]. Pt also request Pantoprazole refilled; pt out of med ins will not allow until later in July. Sandy at [facility] can give pt # [phone] for #[phone].Pt will contact Sandy.

## 2011-08-07 ENCOUNTER — Other Ambulatory Visit: Payer: Self-pay

## 2011-08-07 NOTE — Telephone Encounter (Signed)
Pt request refill Hydrocodone-APAP to [facility].Please advise.

## 2011-08-07 NOTE — Telephone Encounter (Signed)
I will defer to PCP.  Please forward to Dr. Dayton Martes.

## 2011-08-09 MED ORDER — HYDROCODONE-ACETAMINOPHEN 5-325 MG PO TABS: 1.0000 | ORAL_TABLET | ORAL | Status: DC | PRN

## 2011-08-10 ENCOUNTER — Other Ambulatory Visit: Payer: Self-pay

## 2011-08-10 MED ORDER — ALPRAZOLAM 0.25 MG PO TABS: 0.2500 mg | ORAL_TABLET | Freq: Three times a day (TID) | ORAL | Status: DC | PRN

## 2011-08-10 NOTE — Telephone Encounter (Signed)
Pt request refill Alprazolam to [facility].Please advise.

## 2011-08-10 NOTE — Telephone Encounter (Signed)
Medicine called to pharmacy. 

## 2011-08-12 ENCOUNTER — Encounter (INDEPENDENT_AMBULATORY_CARE_PROVIDER_SITE_OTHER): Payer: Self-pay

## 2011-08-17 ENCOUNTER — Inpatient Hospital Stay: Admit: 2011-08-17 | Payer: Self-pay | Admitting: General Surgery

## 2011-08-17 ENCOUNTER — Other Ambulatory Visit: Payer: Self-pay

## 2011-08-17 SURGERY — REPAIR, HERNIA, INCISIONAL
Anesthesia: General

## 2011-08-17 MED ORDER — HYDROCODONE-ACETAMINOPHEN 5-325 MG PO TABS: 1.0000 | ORAL_TABLET | ORAL | Status: DC | PRN

## 2011-08-17 NOTE — Telephone Encounter (Signed)
Medication phoned to [facility] outpatient pharmacy as instructed. Pt will ck with pharmacy later today.

## 2011-08-17 NOTE — Telephone Encounter (Signed)
Pt request Hydrocodone APAP to [facility].Please advise.

## 2011-08-20 ENCOUNTER — Encounter (HOSPITAL_COMMUNITY): Payer: Self-pay | Admitting: Pharmacy Technician

## 2011-08-25 ENCOUNTER — Other Ambulatory Visit: Payer: Self-pay

## 2011-08-25 MED ORDER — HYDROCODONE-ACETAMINOPHEN 5-325 MG PO TABS: 1.0000 | ORAL_TABLET | ORAL | Status: DC | PRN

## 2011-08-25 NOTE — Telephone Encounter (Signed)
Pt request refill Hydrocodone APAP to [facility] outpatient pharmacy. Please advise.

## 2011-08-25 NOTE — Telephone Encounter (Signed)
Medicine called to pharmacy. 

## 2011-08-28 ENCOUNTER — Encounter (HOSPITAL_COMMUNITY)
Admission: RE | Admit: 2011-08-28 | Discharge: 2011-08-28 | Disposition: A | Discharge status: Home / Self Care | Payer: 59 | Source: Ambulatory Visit | Attending: General Surgery | Admitting: General Surgery

## 2011-08-28 ENCOUNTER — Encounter (HOSPITAL_COMMUNITY): Payer: Self-pay

## 2011-08-28 HISTORY — DX: Anxiety disorder, unspecified: F41.9

## 2011-08-28 HISTORY — DX: Personal history of other venous thrombosis and embolism: Z86.718

## 2011-08-28 HISTORY — DX: Gastric ulcer, unspecified as acute or chronic, without hemorrhage or perforation: K25.9

## 2011-08-28 HISTORY — DX: Pain in unspecified joint: M25.50

## 2011-08-28 HISTORY — DX: Dizziness and giddiness: R42

## 2011-08-28 HISTORY — DX: Nocturia: R35.1

## 2011-08-28 HISTORY — DX: Constipation, unspecified: K59.00

## 2011-08-28 HISTORY — DX: Personal history of other medical treatment: Z92.89

## 2011-08-28 LAB — CBC
HCT: 46.6 % (ref 39.0–52.0)
MCH: 30.9 pg (ref 26.0–34.0)
MCV: 91 fL (ref 78.0–100.0)
Platelets: 293 10*3/uL (ref 150–400)
RBC: 5.12 MIL/uL (ref 4.22–5.81)
WBC: 11.1 10*3/uL — ABNORMAL HIGH (ref 4.0–10.5)

## 2011-08-28 LAB — BASIC METABOLIC PANEL
BUN: 16 mg/dL (ref 6–23)
CO2: 27 mEq/L (ref 19–32)
Chloride: 99 mEq/L (ref 96–112)
Glucose, Bld: 102 mg/dL — ABNORMAL HIGH (ref 70–99)
Potassium: 5.7 mEq/L — ABNORMAL HIGH (ref 3.5–5.1)
Sodium: 136 mEq/L (ref 135–145)

## 2011-08-28 NOTE — Pre-Procedure Instructions (Signed)
[address] STANTON KISSOON  08/28/2011   Your procedure is scheduled on:  Fri, Aug 2 @ 12:30 PM  Report to [facility] at 10:30 AM.  Call this number if you have problems the morning of surgery: [phone]   Remember:   Do not eat food:After Midnight.    Take these medicines the morning of surgery with A SIP OF WATER: Alprazoloam(Xanax),Pantoprazole(Protonix),and Pain Pill(if needed)   Do not wear jewelry  Do not wear lotions, powders, or colognes   Men may shave face and neck.  Do not bring valuables to the hospital.  Contacts, dentures or bridgework may not be worn into surgery.  Leave suitcase in the car. After surgery it may be brought to your room.  For patients admitted to the hospital, checkout time is 11:00 AM the day of discharge.   Patients discharged the day of surgery will not be allowed to drive home.  Special Instructions: CHG Shower Use Special Wash: 1/2 bottle night before surgery and 1/2 bottle morning of surgery.   Please read over the following fact sheets that you were given: Pain Booklet, Coughing and Deep Breathing, MRSA Information and Surgical Site Infection Prevention

## 2011-08-28 NOTE — Progress Notes (Signed)
Requested orders from [facility]

## 2011-08-28 NOTE — Progress Notes (Signed)
Pt doesn't have a cardiologist  Denies ever having an echo/stress test/heart cath  Medical MD is Dr.Arron  ekg and cxr in epic from 01/24/11

## 2011-08-31 ENCOUNTER — Other Ambulatory Visit (INDEPENDENT_AMBULATORY_CARE_PROVIDER_SITE_OTHER): Payer: Self-pay | Admitting: General Surgery

## 2011-08-31 DIAGNOSIS — K432 Incisional hernia without obstruction or gangrene: Secondary | ICD-10-CM

## 2011-08-31 NOTE — Consult Note (Signed)
Anesthesia Chart Review:  Patient is a 41 year old male posted for incisional hernia repair on 09/04/11 by Dr. Biagio Quint.  History includes obesity with BMI 33, non-smoker, hiatal hernia, GERD, anxiety, PUD, s/p laparotomy and over-sewing of a bleeding gastric ulcer in 10/2010 complicated by wound infection, history of transfusion.  PCP is Dr. Marijean Heath.  Labs noted.  K+ is 5.7.  This will need to be repeated on arrival.  Cr 0.95.  H/H 15.8/46.6.  Epic indicates Dr. Biagio Quint reviewed labs earlier today.   Meds include Xanax, Norco, Protonix.  EKG on 01/24/11 showed NSR.  CXR on 01/24/11 showed no active cardiopulmonary disease.  Check ISTAT on the day of surgery.  If his K+ is stable/improved then plan to proceed.  Shonna Chock, PA-C

## 2011-09-01 ENCOUNTER — Other Ambulatory Visit: Payer: Self-pay

## 2011-09-01 MED ORDER — HYDROCODONE-ACETAMINOPHEN 5-325 MG PO TABS
1.0000 | ORAL_TABLET | ORAL | Status: DC | PRN
Start: 2011-09-01 — End: 2011-09-11

## 2011-09-01 NOTE — Telephone Encounter (Signed)
Medicine called to pharmacy. 

## 2011-09-01 NOTE — Telephone Encounter (Signed)
Pt request refill Hydrocodone APAP to [facility] pharmacy. Pt is out of med; pt scheduled for surgery Fri.Please advise.

## 2011-09-03 MED ORDER — HEPARIN SODIUM (PORCINE) 5000 UNIT/ML IJ SOLN
5000.0000 [IU] | Freq: Once | INTRAMUSCULAR | Status: AC
  Administered 2011-09-04: 5000 [IU] via SUBCUTANEOUS
  Filled 2011-09-03: qty 1

## 2011-09-03 MED ORDER — CEFAZOLIN SODIUM-DEXTROSE 2-3 GM-% IV SOLR
2.0000 g | INTRAVENOUS | Status: AC
  Administered 2011-09-04: 2 g via INTRAVENOUS
  Filled 2011-09-03: qty 50

## 2011-09-04 ENCOUNTER — Encounter (HOSPITAL_COMMUNITY)
Admission: RE | Disposition: A | Discharge status: Home / Self Care | Payer: Self-pay | Source: Ambulatory Visit | Attending: General Surgery

## 2011-09-04 ENCOUNTER — Encounter (HOSPITAL_COMMUNITY): Payer: Self-pay | Admitting: *Deleted

## 2011-09-04 ENCOUNTER — Encounter (HOSPITAL_COMMUNITY): Payer: Self-pay | Admitting: General Practice

## 2011-09-04 ENCOUNTER — Ambulatory Visit (HOSPITAL_COMMUNITY): Payer: 59 | Admitting: Vascular Surgery

## 2011-09-04 ENCOUNTER — Inpatient Hospital Stay (HOSPITAL_COMMUNITY): Payer: 59

## 2011-09-04 ENCOUNTER — Inpatient Hospital Stay (HOSPITAL_COMMUNITY)
Admission: RE | Admit: 2011-09-04 | Discharge: 2011-09-11 | DRG: 354 | Disposition: A | Discharge status: Home / Self Care | Payer: 59 | Source: Ambulatory Visit | Attending: General Surgery | Admitting: General Surgery

## 2011-09-04 ENCOUNTER — Encounter (HOSPITAL_COMMUNITY): Payer: Self-pay | Admitting: Vascular Surgery

## 2011-09-04 DIAGNOSIS — K668 Other specified disorders of peritoneum: Secondary | ICD-10-CM | POA: Diagnosis present

## 2011-09-04 DIAGNOSIS — M899 Disorder of bone, unspecified: Secondary | ICD-10-CM | POA: Diagnosis present

## 2011-09-04 DIAGNOSIS — K432 Incisional hernia without obstruction or gangrene: Principal | ICD-10-CM | POA: Diagnosis present

## 2011-09-04 DIAGNOSIS — J9819 Other pulmonary collapse: Secondary | ICD-10-CM | POA: Diagnosis not present

## 2011-09-04 DIAGNOSIS — R5082 Postprocedural fever: Secondary | ICD-10-CM | POA: Diagnosis not present

## 2011-09-04 DIAGNOSIS — K219 Gastro-esophageal reflux disease without esophagitis: Secondary | ICD-10-CM | POA: Diagnosis present

## 2011-09-04 DIAGNOSIS — Z79899 Other long term (current) drug therapy: Secondary | ICD-10-CM

## 2011-09-04 HISTORY — PX: INCISIONAL HERNIA REPAIR: SHX193

## 2011-09-04 HISTORY — DX: Residual hemorrhoidal skin tags: K64.4

## 2011-09-04 HISTORY — PX: INSERTION OF MESH: SHX5868

## 2011-09-04 LAB — POCT I-STAT 4, (NA,K, GLUC, HGB,HCT)
Glucose, Bld: 102 mg/dL — ABNORMAL HIGH (ref 70–99)
HCT: 49 % (ref 39.0–52.0)
Hemoglobin: 16.7 g/dL (ref 13.0–17.0)
Potassium: 4.4 mEq/L (ref 3.5–5.1)

## 2011-09-04 SURGERY — REPAIR, HERNIA, INCISIONAL
Anesthesia: General | Site: Abdomen | Wound class: Clean

## 2011-09-04 MED ORDER — HYDROMORPHONE HCL PF 1 MG/ML IJ SOLN: 0.2500 mg | INTRAMUSCULAR | Status: DC | PRN

## 2011-09-04 MED ORDER — MORPHINE SULFATE 2 MG/ML IJ SOLN
2.0000 mg | Freq: Once | INTRAMUSCULAR | Status: AC
  Administered 2011-09-04: 2 mg via INTRAVENOUS

## 2011-09-04 MED ORDER — SODIUM CHLORIDE 0.9 % IJ SOLN: 9.0000 mL | INTRAMUSCULAR | Status: DC | PRN

## 2011-09-04 MED ORDER — MIDAZOLAM HCL 5 MG/5ML IJ SOLN
INTRAMUSCULAR | Status: DC | PRN
  Administered 2011-09-04: 2 mg via INTRAVENOUS

## 2011-09-04 MED ORDER — ONDANSETRON HCL 4 MG/2ML IJ SOLN
INTRAMUSCULAR | Status: DC | PRN
  Administered 2011-09-04: 4 mg via INTRAVENOUS

## 2011-09-04 MED ORDER — PNEUMOCOCCAL VAC POLYVALENT 25 MCG/0.5ML IJ INJ
0.5000 mL | INJECTION | INTRAMUSCULAR | Status: AC
  Filled 2011-09-04: qty 0.5

## 2011-09-04 MED ORDER — MEPERIDINE HCL 25 MG/ML IJ SOLN
INTRAMUSCULAR | Status: AC
  Administered 2011-09-04: 20 mg
  Filled 2011-09-04: qty 1

## 2011-09-04 MED ORDER — ENOXAPARIN SODIUM 40 MG/0.4ML ~~LOC~~ SOLN
40.0000 mg | SUBCUTANEOUS | Status: DC
  Administered 2011-09-05 – 2011-09-10 (×6): 40 mg via SUBCUTANEOUS
  Filled 2011-09-04 (×9): qty 0.4

## 2011-09-04 MED ORDER — KCL IN DEXTROSE-NACL 20-5-0.45 MEQ/L-%-% IV SOLN
INTRAVENOUS | Status: DC
  Administered 2011-09-04 – 2011-09-09 (×13): via INTRAVENOUS
  Filled 2011-09-04 (×20): qty 1000

## 2011-09-04 MED ORDER — PANTOPRAZOLE SODIUM 40 MG IV SOLR
40.0000 mg | INTRAVENOUS | Status: DC
  Administered 2011-09-04 – 2011-09-10 (×7): 40 mg via INTRAVENOUS
  Filled 2011-09-04 (×8): qty 40

## 2011-09-04 MED ORDER — LIDOCAINE HCL 4 % MT SOLN
OROMUCOSAL | Status: DC | PRN
  Administered 2011-09-04: 4 mL via TOPICAL

## 2011-09-04 MED ORDER — FENTANYL CITRATE 0.05 MG/ML IJ SOLN
50.0000 ug | INTRAMUSCULAR | Status: AC | PRN
  Administered 2011-09-04 (×2): 50 ug via INTRAVENOUS

## 2011-09-04 MED ORDER — ONDANSETRON HCL 4 MG/2ML IJ SOLN: 4.0000 mg | Freq: Once | INTRAMUSCULAR | Status: DC | PRN

## 2011-09-04 MED ORDER — NEOSTIGMINE METHYLSULFATE 1 MG/ML IJ SOLN
INTRAMUSCULAR | Status: DC | PRN
  Administered 2011-09-04: 5 mg via INTRAVENOUS

## 2011-09-04 MED ORDER — BUPIVACAINE HCL (PF) 0.25 % IJ SOLN
INTRAMUSCULAR | Status: AC
  Filled 2011-09-04: qty 30

## 2011-09-04 MED ORDER — MORPHINE SULFATE (PF) 1 MG/ML IV SOLN
INTRAVENOUS | Status: AC
  Administered 2011-09-04: 18:00:00
  Filled 2011-09-04: qty 25

## 2011-09-04 MED ORDER — FENTANYL CITRATE 0.05 MG/ML IJ SOLN
INTRAMUSCULAR | Status: DC | PRN
  Administered 2011-09-04: 50 ug via INTRAVENOUS
  Administered 2011-09-04: 100 ug via INTRAVENOUS
  Administered 2011-09-04 (×9): 50 ug via INTRAVENOUS
  Administered 2011-09-04: 100 ug via INTRAVENOUS
  Administered 2011-09-04: 50 ug via INTRAVENOUS

## 2011-09-04 MED ORDER — ONDANSETRON HCL 4 MG/2ML IJ SOLN
4.0000 mg | Freq: Four times a day (QID) | INTRAMUSCULAR | Status: DC | PRN
  Administered 2011-09-05 – 2011-09-07 (×4): 4 mg via INTRAVENOUS
  Filled 2011-09-04 (×2): qty 2

## 2011-09-04 MED ORDER — DIPHENHYDRAMINE HCL 12.5 MG/5ML PO ELIX
12.5000 mg | ORAL_SOLUTION | Freq: Four times a day (QID) | ORAL | Status: DC | PRN
  Filled 2011-09-04: qty 5

## 2011-09-04 MED ORDER — ROCURONIUM BROMIDE 100 MG/10ML IV SOLN
INTRAVENOUS | Status: DC | PRN
  Administered 2011-09-04: 50 mg via INTRAVENOUS

## 2011-09-04 MED ORDER — MORPHINE SULFATE (PF) 1 MG/ML IV SOLN
INTRAVENOUS | Status: DC
  Administered 2011-09-04: 22.5 mg via INTRAVENOUS
  Administered 2011-09-05: 02:00:00 via INTRAVENOUS
  Administered 2011-09-05: 15 mg via INTRAVENOUS
  Administered 2011-09-05: 14.6 mg via INTRAVENOUS
  Administered 2011-09-05: 15 mg via INTRAVENOUS
  Administered 2011-09-05: 16.23 mg via INTRAVENOUS
  Administered 2011-09-05: 16:00:00 via INTRAVENOUS
  Administered 2011-09-05: 26.45 mg via INTRAVENOUS
  Administered 2011-09-05: 09:00:00 via INTRAVENOUS
  Administered 2011-09-06: 7.5 mg via INTRAVENOUS
  Administered 2011-09-06 (×2): via INTRAVENOUS
  Administered 2011-09-06: 10 mL via INTRAVENOUS
  Administered 2011-09-07: 09:00:00 via INTRAVENOUS
  Administered 2011-09-07: 23.17 mg via INTRAVENOUS
  Administered 2011-09-07: 12 mg via INTRAVENOUS
  Administered 2011-09-07: 15 mg via INTRAVENOUS
  Administered 2011-09-07: 9 mg via INTRAVENOUS
  Administered 2011-09-07: 4.5 mg via INTRAVENOUS
  Administered 2011-09-08: 7.5 mg via INTRAVENOUS
  Administered 2011-09-08: 4.5 mg via INTRAVENOUS
  Administered 2011-09-08: 10.09 mg via INTRAVENOUS
  Administered 2011-09-08: 14:00:00 via INTRAVENOUS
  Administered 2011-09-08: 15 mg via INTRAVENOUS
  Administered 2011-09-08: 7.5 mg via INTRAVENOUS
  Administered 2011-09-08: 05:00:00 via INTRAVENOUS
  Administered 2011-09-08: 6 mg via INTRAVENOUS
  Administered 2011-09-09 (×2): 3 mg via INTRAVENOUS
  Administered 2011-09-09: 09:00:00 via INTRAVENOUS
  Filled 2011-09-04 (×12): qty 25

## 2011-09-04 MED ORDER — LIDOCAINE-EPINEPHRINE 1 %-1:100000 IJ SOLN
INTRAMUSCULAR | Status: DC | PRN
  Administered 2011-09-04: 17:00:00 via INTRADERMAL

## 2011-09-04 MED ORDER — ACETAMINOPHEN 10 MG/ML IV SOLN
INTRAVENOUS | Status: DC | PRN
  Administered 2011-09-04: 1000 mg via INTRAVENOUS

## 2011-09-04 MED ORDER — 0.9 % SODIUM CHLORIDE (POUR BTL) OPTIME
TOPICAL | Status: DC | PRN
  Administered 2011-09-04 (×2): 1000 mL

## 2011-09-04 MED ORDER — CEFAZOLIN SODIUM 1-5 GM-% IV SOLN
1.0000 g | Freq: Three times a day (TID) | INTRAVENOUS | Status: DC
  Administered 2011-09-04 – 2011-09-11 (×19): 1 g via INTRAVENOUS
  Filled 2011-09-04 (×23): qty 50

## 2011-09-04 MED ORDER — FENTANYL CITRATE 0.05 MG/ML IJ SOLN
INTRAMUSCULAR | Status: AC
  Filled 2011-09-04: qty 2

## 2011-09-04 MED ORDER — ONDANSETRON HCL 4 MG/2ML IJ SOLN
4.0000 mg | Freq: Four times a day (QID) | INTRAMUSCULAR | Status: DC | PRN
  Administered 2011-09-07: 4 mg via INTRAVENOUS
  Filled 2011-09-04 (×3): qty 2

## 2011-09-04 MED ORDER — ONDANSETRON HCL 4 MG PO TABS: 4.0000 mg | ORAL_TABLET | Freq: Four times a day (QID) | ORAL | Status: DC | PRN

## 2011-09-04 MED ORDER — MEPERIDINE HCL 25 MG/ML IJ SOLN: 6.2500 mg | INTRAMUSCULAR | Status: DC | PRN

## 2011-09-04 MED ORDER — GLYCOPYRROLATE 0.2 MG/ML IJ SOLN
INTRAMUSCULAR | Status: DC | PRN
  Administered 2011-09-04: .6 mg via INTRAVENOUS

## 2011-09-04 MED ORDER — LIDOCAINE HCL (CARDIAC) 20 MG/ML IV SOLN
INTRAVENOUS | Status: DC | PRN
  Administered 2011-09-04: 60 mg via INTRAVENOUS

## 2011-09-04 MED ORDER — ACETAMINOPHEN 10 MG/ML IV SOLN
INTRAVENOUS | Status: AC
  Filled 2011-09-04: qty 100

## 2011-09-04 MED ORDER — KCL IN DEXTROSE-NACL 20-5-0.45 MEQ/L-%-% IV SOLN
INTRAVENOUS | Status: AC
  Administered 2011-09-04: 1000 mL
  Filled 2011-09-04: qty 1000

## 2011-09-04 MED ORDER — LIDOCAINE-EPINEPHRINE 1 %-1:100000 IJ SOLN
INTRAMUSCULAR | Status: AC
  Filled 2011-09-04: qty 1

## 2011-09-04 MED ORDER — PROPOFOL 10 MG/ML IV EMUL
INTRAVENOUS | Status: DC | PRN
  Administered 2011-09-04: 200 mg via INTRAVENOUS

## 2011-09-04 MED ORDER — LORAZEPAM 2 MG/ML IJ SOLN
1.0000 mg | Freq: Three times a day (TID) | INTRAMUSCULAR | Status: DC | PRN
Start: 2011-09-04 — End: 2011-09-11
  Administered 2011-09-05 – 2011-09-06 (×3): 1 mg via INTRAVENOUS
  Filled 2011-09-04 (×3): qty 1

## 2011-09-04 MED ORDER — DIPHENHYDRAMINE HCL 50 MG/ML IJ SOLN: 12.5000 mg | Freq: Four times a day (QID) | INTRAMUSCULAR | Status: DC | PRN

## 2011-09-04 MED ORDER — MORPHINE SULFATE 2 MG/ML IJ SOLN
INTRAMUSCULAR | Status: AC
  Filled 2011-09-04: qty 1

## 2011-09-04 MED ORDER — VECURONIUM BROMIDE 10 MG IV SOLR
INTRAVENOUS | Status: DC | PRN
  Administered 2011-09-04: 2 mg via INTRAVENOUS
  Administered 2011-09-04: 3 mg via INTRAVENOUS
  Administered 2011-09-04: 1 mg via INTRAVENOUS
  Administered 2011-09-04: 2 mg via INTRAVENOUS
  Administered 2011-09-04 (×2): 1 mg via INTRAVENOUS
  Administered 2011-09-04 (×3): 2 mg via INTRAVENOUS

## 2011-09-04 MED ORDER — LACTATED RINGERS IV SOLN
INTRAVENOUS | Status: DC
  Administered 2011-09-04: 13:00:00 via INTRAVENOUS

## 2011-09-04 MED ORDER — NALOXONE HCL 0.4 MG/ML IJ SOLN: 0.4000 mg | INTRAMUSCULAR | Status: DC | PRN

## 2011-09-04 MED ORDER — LACTATED RINGERS IV SOLN
INTRAVENOUS | Status: DC | PRN
  Administered 2011-09-04 (×4): via INTRAVENOUS

## 2011-09-04 SURGICAL SUPPLY — 64 items
ADH SKN CLS LQ APL DERMABOND (GAUZE/BANDAGES/DRESSINGS) ×1
BINDER ABD UNIV 12 45-62 (WOUND CARE) IMPLANT
BINDER ABDOMINAL 46IN 62IN (WOUND CARE) ×2
BLADE SURG 11 STRL SS (BLADE) ×1 IMPLANT
BLADE SURG ROTATE 9660 (MISCELLANEOUS) ×1 IMPLANT
CANISTER SUCTION 2500CC (MISCELLANEOUS) ×3 IMPLANT
CHLORAPREP W/TINT 26ML (MISCELLANEOUS) ×2 IMPLANT
CLOTH BEACON ORANGE TIMEOUT ST (SAFETY) ×2 IMPLANT
CONT SPEC 4OZ CLIKSEAL STRL BL (MISCELLANEOUS) ×1 IMPLANT
COVER SURGICAL LIGHT HANDLE (MISCELLANEOUS) ×2 IMPLANT
DECANTER SPIKE VIAL GLASS SM (MISCELLANEOUS) IMPLANT
DERMABOND ADHESIVE PROPEN (GAUZE/BANDAGES/DRESSINGS) ×1
DERMABOND ADVANCED .7 DNX6 (GAUZE/BANDAGES/DRESSINGS) IMPLANT
DRAIN CHANNEL 19F RND (DRAIN) ×2 IMPLANT
DRAPE INCISE IOBAN 66X45 STRL (DRAPES) ×1 IMPLANT
DRAPE LAPAROSCOPIC ABDOMINAL (DRAPES) ×2 IMPLANT
ELECT CAUTERY BLADE 6.4 (BLADE) ×2 IMPLANT
ELECT REM PT RETURN 9FT ADLT (ELECTROSURGICAL) ×2
ELECTRODE REM PT RTRN 9FT ADLT (ELECTROSURGICAL) ×1 IMPLANT
EVACUATOR SILICONE 100CC (DRAIN) ×2 IMPLANT
GLOVE BIO SURGEON STRL SZ 6.5 (GLOVE) ×2 IMPLANT
GLOVE BIOGEL M STRL SZ7.5 (GLOVE) ×3 IMPLANT
GLOVE BIOGEL PI IND STRL 7.0 (GLOVE) IMPLANT
GLOVE BIOGEL PI IND STRL 8 (GLOVE) IMPLANT
GLOVE BIOGEL PI INDICATOR 7.0 (GLOVE) ×2
GLOVE BIOGEL PI INDICATOR 8 (GLOVE) ×2
GLOVE NEODERM STER SZ 7 (GLOVE) ×1 IMPLANT
GLOVE SURG SS PI 6.5 STRL IVOR (GLOVE) ×2 IMPLANT
GLOVE SURG SS PI 7.5 STRL IVOR (GLOVE) ×5 IMPLANT
GOWN PREVENTION PLUS XLARGE (GOWN DISPOSABLE) ×3 IMPLANT
GOWN PREVENTION PLUS XXLARGE (GOWN DISPOSABLE) ×1 IMPLANT
GOWN STRL NON-REIN LRG LVL3 (GOWN DISPOSABLE) ×5 IMPLANT
KIT BASIN OR (CUSTOM PROCEDURE TRAY) ×2 IMPLANT
KIT ROOM TURNOVER OR (KITS) ×2 IMPLANT
NDL HYPO 25GX1X1/2 BEV (NEEDLE) ×1 IMPLANT
NEEDLE HYPO 25GX1X1/2 BEV (NEEDLE) ×2 IMPLANT
NS IRRIG 1000ML POUR BTL (IV SOLUTION) ×2 IMPLANT
PACK GENERAL/GYN (CUSTOM PROCEDURE TRAY) ×2 IMPLANT
PAD ARMBOARD 7.5X6 YLW CONV (MISCELLANEOUS) ×2 IMPLANT
RETAINER VISCERA MED (MISCELLANEOUS) ×1 IMPLANT
SLEEVE SURGEON STRL (DRAPES) ×5 IMPLANT
SPONGE GAUZE 4X4 12PLY (GAUZE/BANDAGES/DRESSINGS) ×1 IMPLANT
SPONGE LAP 18X18 X RAY DECT (DISPOSABLE) ×1 IMPLANT
STAPLER VISISTAT 35W (STAPLE) ×1 IMPLANT
SUCTION POOLE TIP (SUCTIONS) ×1 IMPLANT
SUT ETHILON 2 0 FS 18 (SUTURE) ×2 IMPLANT
SUT NOVA NAB GS-21 1 T12 (SUTURE) ×2 IMPLANT
SUT PDS AB 0 CT 36 (SUTURE) ×4 IMPLANT
SUT PDS AB 1 CTX 36 (SUTURE) ×1 IMPLANT
SUT PDS AB 1 TP1 96 (SUTURE) ×2 IMPLANT
SUT PROLENE 0 CT 1 CR/8 (SUTURE) ×2 IMPLANT
SUT VIC AB 2-0 CT1 27 (SUTURE)
SUT VIC AB 2-0 CT1 TAPERPNT 27 (SUTURE) IMPLANT
SUT VIC AB 2-0 SH 27 (SUTURE) ×2
SUT VIC AB 2-0 SH 27XBRD (SUTURE) IMPLANT
SUT VIC AB 3-0 CT1 27 (SUTURE) ×2
SUT VIC AB 3-0 CT1 TAPERPNT 27 (SUTURE) IMPLANT
SUT VIC AB 3-0 SH 18 (SUTURE) ×1 IMPLANT
SYR CONTROL 10ML LL (SYRINGE) ×2 IMPLANT
TAPE CLOTH SURG 4X10 WHT LF (GAUZE/BANDAGES/DRESSINGS) ×1 IMPLANT
TOWEL OR 17X24 6PK STRL BLUE (TOWEL DISPOSABLE) ×2 IMPLANT
TOWEL OR 17X26 10 PK STRL BLUE (TOWEL DISPOSABLE) ×2 IMPLANT
TRAY FOLEY CATH 14FRSI W/METER (CATHETERS) IMPLANT
Ultrapro 12inx12in UML1 (Mesh General) ×1 IMPLANT

## 2011-09-04 NOTE — Progress Notes (Signed)
Called Dr. Biagio Quint about the need for cbc diff. Dr. Biagio Quint  Stated no need to do cbc diff since cbc and bmet already done.

## 2011-09-04 NOTE — Progress Notes (Signed)
Order received from Dr. Michelle Piper for an additional Morphine 3 mg IVP , Pt using PCA

## 2011-09-04 NOTE — H&P (Signed)
HPI  [name] is a 41 y.o. male. This patient presents for evaluation of incisional hernia. He reports undergoing a laparotomy and oversewing of a bleeding gastric ulcer in September for an acute bleed. Ever since his surgery he has had pain and a bulge in the area of incision. His surgery is competent with a postoperative wound infection requiring opening of this incision and packing this wound. Since then he has had a "achy and sore" pain in his mid abdomen around the area of a notable bulge. He says he has nausea and frequent diarrhea as well. Currently he is on Protonix twice a day for treatment of this ulcer. He had repeat endoscopy and this showed resolution of his ulcer and he was cleared for surgery by his GI doctor.  He remains symptomatic from this hernia. HPI  Past Medical History   Diagnosis  Date   .  GERD (gastroesophageal reflux disease)    .  Upper GI bleed    .  Ventral hernia    .  Hiatal hernia     Past Surgical History   Procedure  Date   .  Oversewing of gastric ulcer    .  Abdominal surgery     Family History   Problem  Relation  Age of Onset   .  Diabetes  Mother    .  Diabetes  Father    .  Heart disease  Father    Social History  History   Substance Use Topics   .  Smoking status:  Never Smoker   .  Smokeless tobacco:  Not on file   .  Alcohol Use:  Yes      occasional    Allergies   Allergen  Reactions   .  Reglan  Hives, Swelling and Rash     Swelling in lips   .  Dilaudid (Hydromorphone Hcl)      Shakey, rash    Current Outpatient Prescriptions   Medication  Sig  Dispense  Refill   .  acetaminophen (TYLENOL) 325 MG tablet  Take by mouth every 6 (six) hours as needed. pain     .  ALPRAZolam (XANAX) 0.25 MG tablet  Take 1 tablet (0.25 mg total) by mouth 3 (three) times daily as needed for anxiety.  30 tablet  0   .  ferrous fumarate (HEMOCYTE - 106 MG FE) 325 (106 FE) MG TABS  Take 1 tablet by mouth 2 (two) times daily.     Marland Kitchen   oxyCODONE-acetaminophen (PERCOCET) 5-325 MG per tablet  Take 1-2 tablets by mouth at bedtime.     .  pantoprazole (PROTONIX) 40 MG tablet  Take 1 tablet (40 mg total) by mouth 2 (two) times daily.  180 tablet  3   .  sucralfate (CARAFATE) 1 GM/10ML suspension  Take 10 mLs (1 g total) by mouth every 6 (six) hours as needed (abdominal pain with nausea).  420 mL  0   Review of Systems  Review of Systems  All other review of systems negative or noncontributory except as stated in the HPI  Wt Readings from Last 3 Encounters:  08/28/11 227 lb 8.2 oz (103.2 kg)  07/30/11 230 lb 9.6 oz (104.599 kg)  05/29/11 236 lb 12.8 oz (107.412 kg)   Temp Readings from Last 3 Encounters:  09/04/11 98.1 F (36.7 C) Oral  09/04/11 98.1 F (36.7 C) Oral  08/28/11 99.2 F (37.3 C)    BP Readings from Last  3 Encounters:  09/04/11 126/76  09/04/11 126/76  08/28/11 135/81   Pulse Readings from Last 3 Encounters:  09/04/11 94  09/04/11 94  08/28/11 91    Physical Exam  Physical Exam  Physical Exam  Vitals reviewed.  Constitutional: He is oriented to person, place, and time. He appears well-developed and well-nourished. No distress.  HENT:  Head: Normocephalic and atraumatic.  Mouth/Throat: No oropharyngeal exudate.  Eyes: Conjunctivae and EOM are normal. Pupils are equal, round, and reactive to light. Right eye exhibits no discharge. Left eye exhibits no discharge. No scleral icterus.  Neck: Normal range of motion. No tracheal deviation present.  Cardiovascular: Normal rate, regular rhythm and normal heart sounds.  Pulmonary/Chest: Effort normal and breath sounds normal. No stridor. No respiratory distress. He has no wheezes. He has no rales. He exhibits no tenderness.  Abdominal: Soft. Bowel sounds are normal. He exhibits no distension and no mass. There is no tenderness. There is no rebound and no guarding. Large bulge to right of midline, c/w incisional hernia, tender to palpation but easily  reducible. Musculoskeletal: Normal range of motion. He exhibits no edema and no tenderness.  Neurological: He is alert and oriented to person, place, and time.  Skin: Skin is warm and dry. No rash noted. He is not diaphoretic. No erythema. No pallor.  Psychiatric: He has a normal mood and affect. His behavior is normal. Judgment and thought content normal.  Data Reviewed  CT, op note  Assessment  Incisional hernia-reducible  He has a moderate to large incisional hernia in the upper midline and just to the right of his midline. He is very symptomatic from this with tenderness. He would like this repaired and I again discussed with him the risks of the surgery including infection, bleeding, pain, chronic pain, scarring, seroma, recurrence, bowel injury, need for component separation procedure, and he expressed understanding and desires to proceed with ventral hernia repair with possible component separation release.

## 2011-09-04 NOTE — Progress Notes (Signed)
NGT advanced 3 cms per DR. Wayatt , NGT heard on ascultation, draining green fluid

## 2011-09-04 NOTE — Progress Notes (Signed)
No follow up xray needed per DR. wyatt

## 2011-09-04 NOTE — Progress Notes (Signed)
Xray does not identify NGT attempted ascultation with air and no sound heard drainage is green

## 2011-09-04 NOTE — Anesthesia Procedure Notes (Signed)
Procedure Name: Intubation Date/Time: 09/04/2011 1:08 PM Performed by: Romie Minus K Pre-anesthesia Checklist: Patient identified, Emergency Drugs available, Suction available, Patient being monitored and Timeout performed Patient Re-evaluated:Patient Re-evaluated prior to inductionOxygen Delivery Method: Circle system utilized Preoxygenation: Pre-oxygenation with 100% oxygen Intubation Type: IV induction Ventilation: Mask ventilation without difficulty and Oral airway inserted - appropriate to patient size Laryngoscope Size: Miller and 2 Grade View: Grade I Tube type: Oral Number of attempts: 1 Airway Equipment and Method: Stylet and LTA kit utilized Placement Confirmation: ETT inserted through vocal cords under direct vision,  positive ETCO2 and breath sounds checked- equal and bilateral Secured at: 22 cm Tube secured with: Tape Dental Injury: Teeth and Oropharynx as per pre-operative assessment

## 2011-09-04 NOTE — Progress Notes (Signed)
initiated morphine PCA  patient given 1.5mg  bolus States pain 10 but sleeping unless disturbed

## 2011-09-04 NOTE — Progress Notes (Signed)
Ancef 2 gm     Sent to or

## 2011-09-04 NOTE — Op Note (Signed)
[name], [name] NO.:  [id number]  MEDICAL RECORD NO.:  [id number]  LOCATION:  6N04C                        FACILITY:  [facility]  PHYSICIAN:  Lodema Pilot, MD       DATE OF BIRTH:  09/05/1970  DATE OF PROCEDURE:  09/04/2011 DATE OF DISCHARGE:                              OPERATIVE REPORT   PROCEDURE:  Exploratory laparotomy with incisional hernia repair with mesh,  excision of abdominal wall mass.  SURGEON:  Lodema Pilot, MD  ASSISTANT:  Dr. Andrey Campanile.  PREOPERATIVE DIAGNOSIS:  Ventral hernia.  POSTOPERATIVE DIAGNOSIS:  Ventral hernia with an abdominal wall mass.  ANESTHESIA:  General endotracheal anesthesia with 40 mL of 1% lidocaine with epinephrine and 0.25% Marcaine in a 50:50 mixture.  FLUIDS:  Three liters of crystalloid.  ESTIMATED BLOOD LOSS:  200 mL.  DRAINS:  A 19-French Blake drain x2 placed in the retro-rectus space.  SPECIMEN:  Frozen section, mesenteric mesh which was negative for malignancy per Dr. Frederica Kuster.  COMPLICATIONS:  None apparent.  FINDINGS:  Large ventral hernias in the midline with several smaller defects as well, which was repaired with layered closure and placement of the  30 cm x 30 cm UltraPro mesh, placed in the retro-rectus space, incision of subxiphoid abdominal bony mass.  INDICATION FOR PROCEDURE:  [name] is a 41 year old male who has a history of perforated peptic ulcer who underwent laparotomy and repair of perforated ulcer.  He had a subsequent wound infection and developed a hernia after his procedure, he has been symptomatic from this and desired repair.  He had upper endoscopy, which demonstrated healing of this ulcer.  OPERATIVE DETAILS:  [name] was seen and evaluated in the preoperative area.  Surgical site was marked with the patient.  Risks and benefits of the procedure were again discussed in lay terms.  Informed consent was obtained, and he was given prophylactic antibiotics and subcu heparin, and  taken to the operating room, placed on table in the supine position. His arms were tucked bilaterally and general endotracheal anesthesia was obtained.  Foley catheter was placed.  NG tube was placed as well, and his abdomen was prepped and draped in the standard surgical fashion. Ioban drape was placed to minimize skin contact with the mesh and his prior surgical scar was excised.  Dissection was carried down to the fascia and the abdominal wall fascia was incised and the peritoneum was entered, then adhesiolysis was performed using sharp dissection.  He had 1 loop of small bowel which was fairly densely adhered to the abdominal wound into the abdominal wall just under the midline wound and this was taken down from the peritoneum using sharp dissection.  The area on the small bowel was oversewn with Lembert sutures, although there was no evidence of any bowel injury.  He had some omental adhesions to the upper midline.  I was able to open the incision, further total length of incision and adhesiolysis performed wide laterally.  The entire small bowel was run, ligament of Treitz and he actually had very few interloop adhesion.  I investigated this area of small bowel, which was taken down from the abdominal wall, and  this was noted to be adequate without any evidence of bowel injury.  He had some nodularity and some firm white nodules in the mesentery of the small bowel, and one of these nodules was removed and sent to pathology for frozen section.  Then Dr. Frederica Kuster called back to confirm that there was no malignancy and this was likely a fibrotic process.  I did not see any other nodularity or studding in this area of the mesentery.  His liver appeared normal, was palpably normal.  He had a recent upper endoscopy, which did not demonstrate any masses or concern for malignancy as well.  He had in his upper midline just to the left of his xiphoid process, a hard calcified bony growth which he  had pointed out to me preoperatively and this was possibly the cause of his pain associated with this hernia.  I skeletonized the bony prominence which was about 5 cm long and 2 cm wide and using the bone cutter, I cut the soft near it, takeoff in the area of the xiphoid process.  I used a rongeur to clean up the other palpable calcified mass and fired it down.  Hemostasis was adequate with a small amount of cautery and bone wax was not needed.  I then entered the posterior sheath near the midline and entered the retro-rectus space and carried this incision to cephalad and caudad and carried this out to the lateral border of the rectus muscle.  I performed this bilaterally in similar fashion, and at the preperitoneal space inferiorly, then prior to closing the abdomen, again inspected the small bowel for any bowel injury and none was identified and the abdomen was irrigated with sterile saline solution.  It was noted to be hemostatic and again without any evidence of bowel injury.  Then the posterior rectus fascia and peritoneum were approximated with a running 0 PDS suture x2 taking care to avoid injury to underlying bowel contents with the fascia approximated in the midline and all sponge count and instrument count was performed which was normal.  Then, the elevated recto-rectus space was inspected for hemostasis, which was noted to be adequate and irrigated with sterile saline solution.  Then, a 30 cm x 30 cm UltraPro mesh was trimmed slightly on the edges and 0 Prolene sutures were passed through the mesh circumferentially on the permanent mesh approximately 5- 6 cm in between sutures and the sutures were pulled up through the abdominal wall and sectioning of the transfascial sutures were pulled up to the abdominal wall using the laparoscopic suture passer through separate stab incisions.  The mesh was pulled tight and widely covered all the defects, all has been marched laterally  towards the cephalad aspect of the incision.  Additional sutures were placed and the mesh was placed slightly in the upper midline and sutured with transfascial sutures.  In the left upper quadrant, the mesh was tucked under the ribs and sutured with a 2-0 Vicryl suture to the posterior rectus fascia keeping it under the rib without fair or wider overlap.  Then, all the sutures were secured and the mesh appeared to cover all the defects widely and the space was irrigated with sterile saline solution and noted to be hemostatic.  The two 19-French Blake drains were placed through separate stab incisions and the right lower quadrant drain was crossed over the midline to the left side and the left lower quadrant drain was crossed in the midline to the right side and suture in  place with 2-0 nylon drain stitches.  Then, the anterior fascia was approximated in the midline with a running #1 looped PDS suture.  The hernia sac was excised and the wound was irrigated and the midline was closed with skin staples.  Procedure sites were approximated with Dermabond and all sponge, needle, and instrument counts were correct at the end of the case.  The patient tolerated procedure well without apparent complication.         ______________________________ Lodema Pilot, MD    BL/MEDQ  D:  09/04/2011  T:  09/04/2011  Job:  [id number]

## 2011-09-04 NOTE — Brief Op Note (Signed)
09/04/2011  5:30 PM  PATIENT:  [name]  41 y.o. male  PRE-OPERATIVE DIAGNOSIS:  incisional hernia  POST-OPERATIVE DIAGNOSIS:  incisional hernia  PROCEDURE:  Procedure(s) (LRB): HERNIA REPAIR INCISIONAL (N/A), with excision of boney mass  SURGEON:  Surgeon(s) and Role:    * Lodema Pilot, DO - Primary    * Atilano Ina, MD,FACS - Assisting  PHYSICIAN ASSISTANT:   ASSISTANTS: wilson   ANESTHESIA:   general  EBL:  Total I/O In: 2500 [I.V.:2500] Out: 500 [Urine:300; Blood:200]  BLOOD ADMINISTERED:none  DRAINS: (52F x2) Jackson-Pratt drain(s) with closed bulb suction in the retrorectus space   LOCAL MEDICATIONS USED:  MARCAINE    and LIDOCAINE   SPECIMEN:  Source of Specimen:  mesenteric mass, negative for malignancy per Dr. Frederica Kuster  DISPOSITION OF SPECIMEN:  PATHOLOGY  COUNTS:  YES  TOURNIQUET:  * No tourniquets in log *  DICTATION: .Other Dictation: Dictation Number   PLAN OF CARE: Admit to inpatient   PATIENT DISPOSITION:  PACU - hemodynamically stable.   Delay start of Pharmacological VTE agent (>24hrs) due to surgical blood loss or risk of bleeding: no

## 2011-09-04 NOTE — Preoperative (Signed)
Beta Blockers   Reason not to administer Beta Blockers:Not Applicable 

## 2011-09-04 NOTE — Transfer of Care (Signed)
Immediate Anesthesia Transfer of Care Note  Patient: [name]  Procedure(s) Performed: Procedure(s) (LRB): HERNIA REPAIR INCISIONAL (N/A)  Patient Location: PACU  Anesthesia Type: General  Level of Consciousness: awake, alert  and oriented  Airway & Oxygen Therapy: Patient Spontanous Breathing and Patient connected to face mask oxygen  Post-op Assessment: Report given to PACU RN and Post -op Vital signs reviewed and stable  Post vital signs: Reviewed  Complications: No apparent anesthesia complications

## 2011-09-04 NOTE — Anesthesia Preprocedure Evaluation (Addendum)
Anesthesia Evaluation  Patient identified by MRN, date of birth, ID band Patient awake    Reviewed: Allergy & Precautions, H&P , NPO status , Patient's Chart, lab work & pertinent test results  History of Anesthesia Complications Negative for: history of anesthetic complications  Airway Mallampati: II TM Distance: >3 FB Neck ROM: Full    Dental  (+) Teeth Intact and Dental Advisory Given   Pulmonary neg pulmonary ROS,  breath sounds clear to auscultation        Cardiovascular negative cardio ROS  Rhythm:Regular Rate:Normal     Neuro/Psych    GI/Hepatic hiatal hernia, PUD, GERD-  Controlled and Medicated,  Endo/Other    Renal/GU      Musculoskeletal   Abdominal   Peds  Hematology   Anesthesia Other Findings   Reproductive/Obstetrics                           Anesthesia Physical Anesthesia Plan  ASA: I  Anesthesia Plan: General   Post-op Pain Management:    Induction: Intravenous  Airway Management Planned: Oral ETT  Additional Equipment:   Intra-op Plan:   Post-operative Plan: Extubation in OR  Informed Consent: I have reviewed the patients History and Physical, chart, labs and discussed the procedure including the risks, benefits and alternatives for the proposed anesthesia with the patient or authorized representative who has indicated his/her understanding and acceptance.     Plan Discussed with: CRNA, Anesthesiologist and Surgeon  Anesthesia Plan Comments:         Anesthesia Quick Evaluation

## 2011-09-04 NOTE — Progress Notes (Signed)
Called Dr. Delice Lesch service , call returned by Dr. Lindie Spruce. Informed no sign of NGT on abdominal xray bu tNGT is draining . Dr. Lindie Spruce wants NGT advanced 3-4 cms.

## 2011-09-04 NOTE — Anesthesia Postprocedure Evaluation (Signed)
Anesthesia Post Note  Patient: [name]  Procedure(s) Performed: Procedure(s) (LRB): HERNIA REPAIR INCISIONAL (N/A)  Anesthesia type: general  Patient location: PACU  Post pain: Pain level controlled  Post assessment: Patient's Cardiovascular Status Stable  Last Vitals:  Filed Vitals:   09/04/11 1945  BP: 124/71  Pulse: 86  Temp:   Resp: 21    Post vital signs: Reviewed and stable  Level of consciousness: sedated  Complications: No apparent anesthesia complications

## 2011-09-04 NOTE — Progress Notes (Signed)
1820 JP's put to suction as ordered

## 2011-09-04 NOTE — Progress Notes (Signed)
Dr. Michelle Piper called regarding patient's pain level of 8. Orders received to give  Morphine 2mg  IVP

## 2011-09-05 LAB — CBC
HCT: 41.6 % (ref 39.0–52.0)
Hemoglobin: 14.1 g/dL (ref 13.0–17.0)
MCH: 31.5 pg (ref 26.0–34.0)
MCHC: 33.9 g/dL (ref 30.0–36.0)
MCV: 93.1 fL (ref 78.0–100.0)
RDW: 13.2 % (ref 11.5–15.5)

## 2011-09-05 LAB — GLUCOSE, CAPILLARY

## 2011-09-05 LAB — BASIC METABOLIC PANEL
BUN: 11 mg/dL (ref 6–23)
Creatinine, Ser: 1.05 mg/dL (ref 0.50–1.35)
GFR calc Af Amer: >90 mL/min (ref >90)
GFR calc non Af Amer: 87 mL/min — ABNORMAL LOW (ref >90)
Glucose, Bld: 116 mg/dL — ABNORMAL HIGH (ref 70–99)

## 2011-09-05 MED ORDER — ACETAMINOPHEN 650 MG RE SUPP: 650.0000 mg | Freq: Four times a day (QID) | RECTAL | Status: DC | PRN

## 2011-09-05 MED ORDER — BIOTENE DRY MOUTH MT LIQD
15.0000 mL | Freq: Two times a day (BID) | OROMUCOSAL | Status: DC
  Administered 2011-09-05 – 2011-09-09 (×10): 15 mL via OROMUCOSAL

## 2011-09-05 MED ORDER — ACETAMINOPHEN 325 MG PO TABS
650.0000 mg | ORAL_TABLET | Freq: Four times a day (QID) | ORAL | Status: DC | PRN
  Administered 2011-09-06: 650 mg via ORAL
  Filled 2011-09-05: qty 2

## 2011-09-05 MED ORDER — ZOLPIDEM TARTRATE 5 MG PO TABS
5.0000 mg | ORAL_TABLET | Freq: Every evening | ORAL | Status: DC | PRN
  Administered 2011-09-06 – 2011-09-10 (×5): 5 mg via ORAL
  Filled 2011-09-05 (×5): qty 1

## 2011-09-05 MED ORDER — WHITE PETROLATUM GEL
Status: AC
  Administered 2011-09-05: 16:00:00
  Filled 2011-09-05: qty 5

## 2011-09-05 NOTE — Progress Notes (Signed)
Encouraged deep breathing and IS, O2 sat @90 -93% on 4L [place].

## 2011-09-05 NOTE — Progress Notes (Signed)
O2 sat down to 86-87% on 5L if he falls asleep, switched to 35% venturi mask and reiterate the importance of deep breathing and IS use again

## 2011-09-05 NOTE — Progress Notes (Signed)
Patient ID: [name], male   DOB: Oct 14, 1970, 41 y.o.   MRN: [id number] 1 Day Post-Op  Subjective: Very sore and uncomfortable but no severe pain. No other complaints.  Objective: Vital signs in last 24 hours: Temp:  [98.1 F (36.7 C)-100.1 F (37.8 C)] 100.1 F (37.8 C) (08/03 0527) Pulse Rate:  [59-120] 120  (08/03 0527) Resp:  [15-25] 25  (08/03 0749) BP: (121-139)/(64-77) 137/77 mmHg (08/03 0527) SpO2:  [90 %-100 %] 92 % (08/03 0749) Last BM Date: 09/03/11  Intake/Output from previous day: 08/02 0701 - 08/03 0700 In: 4533 [I.V.:4533] Out: 1435 [Urine:925; Emesis/NG output:75; Drains:235; Blood:200] Intake/Output this shift:    General appearance: alert and no distress Resp: clear to auscultation bilaterally GI: abnormal findings:  mild tenderness in the entire abdomen Incision/Wound: clean and dry  Lab Results:   Basename 09/05/11 0636 09/04/11 1117  WBC 15.1* --  HGB 14.1 16.7  HCT 41.6 49.0  PLT 292 --   BMET  Basename 09/05/11 0636 09/04/11 1117  NA 136 141  K 4.0 4.4  CL 100 --  CO2 25 --  GLUCOSE 116* 102*  BUN 11 --  CREATININE 1.05 --  CALCIUM 8.6 --     Studies/Results: Dg Abd Portable 1v  09/04/2011  *RADIOLOGY REPORT*  Clinical Data: Nasogastric tube placement.  PORTABLE ABDOMEN - 1 VIEW  Comparison: No priors.  Findings: There are surgical staples projecting over the right side of the abdomen just off the midline.  Two surgical drains are in position in both sides of the abdomen.  Small amount of gas is noted throughout the small bowel and colon.  No definite pathologic distension of bowel is noted.  No definite nasogastric tube is identified (upper abdomen is incompletely imaged).  IMPRESSION: 1.  Postoperative changes and support apparatus, as above.  No nasogastric tube identified.  Original Report Authenticated By: Florencia Reasons, M.D.    Anti-infectives: Anti-infectives     Start     Dose/Rate Route Frequency Ordered Stop   09/04/11 2115   ceFAZolin (ANCEF) IVPB 1 g/50 mL premix        1 g 100 mL/hr over 30 Minutes Intravenous Every 8 hours 09/04/11 2102     09/03/11 1104   ceFAZolin (ANCEF) IVPB 2 g/50 mL premix        2 g 100 mL/hr over 30 Minutes Intravenous 60 min pre-op 09/03/11 1104 09/04/11 1302          Assessment/Plan: s/p Procedure(s): HERNIA REPAIR INCISIONAL Stable postoperatively. Pulmonary toilet encouraged and will get out of bed today.   LOS: 1 day    Ahniya Mitchum T 09/05/2011

## 2011-09-06 ENCOUNTER — Inpatient Hospital Stay (HOSPITAL_COMMUNITY): Payer: 59

## 2011-09-06 LAB — BASIC METABOLIC PANEL
BUN: 6 mg/dL (ref 6–23)
Calcium: 8.6 mg/dL (ref 8.4–10.5)
GFR calc Af Amer: >90 mL/min (ref >90)
GFR calc non Af Amer: 87 mL/min — ABNORMAL LOW (ref >90)
Glucose, Bld: 110 mg/dL — ABNORMAL HIGH (ref 70–99)
Potassium: 4.1 mEq/L (ref 3.5–5.1)
Sodium: 133 mEq/L — ABNORMAL LOW (ref 135–145)

## 2011-09-06 LAB — CBC
Hemoglobin: 13.7 g/dL (ref 13.0–17.0)
MCH: 30.6 pg (ref 26.0–34.0)
MCHC: 32.8 g/dL (ref 30.0–36.0)
RDW: 13.1 % (ref 11.5–15.5)

## 2011-09-06 MED ORDER — KETOROLAC TROMETHAMINE 30 MG/ML IJ SOLN
30.0000 mg | Freq: Four times a day (QID) | INTRAMUSCULAR | Status: AC | PRN
Start: 2011-09-06 — End: 2011-09-08
  Administered 2011-09-06 – 2011-09-07 (×4): 30 mg via INTRAVENOUS
  Filled 2011-09-06 (×4): qty 1

## 2011-09-06 MED ORDER — SODIUM CHLORIDE 0.9 % IV BOLUS (SEPSIS)
500.0000 mL | Freq: Once | INTRAVENOUS | Status: AC
  Administered 2011-09-06: 500 mL via INTRAVENOUS

## 2011-09-06 NOTE — Progress Notes (Signed)
He says that the pain is slightly better than yesterday.  HR 115, incisions look okay and abdomen exam is appropriate.  NG with brownish output.  Xray with low volumes and atalectasis is likely the cause of his fevers.  Will work on pulmonary toilet and add toradol for pain relief.

## 2011-09-06 NOTE — Progress Notes (Signed)
Patient ID: [name], male   DOB: 30-Jul-1970, 41 y.o.   MRN: [id number] 2 Days Post-Op  Subjective: Without specific complaints. He is very "sore" in his abdomen but no worsening pain. Has had some productive cough.  Objective: Vital signs in last 24 hours: Temp:  [99.7 F (37.6 C)-102.8 F (39.3 C)] 100.6 F (38.1 C) (08/04 0641) Pulse Rate:  [105-125] 109  (08/04 0641) Resp:  [17-30] 21  (08/04 0641) BP: (129-148)/(71-84) 135/80 mmHg (08/04 0641) SpO2:  [86 %-95 %] 93 % (08/04 0641) FiO2 (%):  [35 %] 35 % (08/03 2055) Weight:  [227 lb 8.2 oz (103.2 kg)] 227 lb 8.2 oz (103.2 kg) (08/03 0900) Last BM Date: 09/03/11  Intake/Output from previous day: 08/03 0701 - 08/04 0700 In: 1602 [I.V.:1500; NG/GT:100] Out: 1912 [Urine:1600; Emesis/NG output:50; Drains:262] Intake/Output this shift:    General appearance: alert, no distress and somewhat diaphoretic GI: abnormal findings:  moderate tenderness in the entire abdomen Incision/Wound: wounds without erythema or drainage or other signs of infection  Lab Results:   [facility] 09/06/11 0525 09/05/11 0636  WBC 17.8* 15.1*  HGB 13.7 14.1  HCT 41.8 41.6  PLT 265 292   BMET  Basename 09/06/11 0525 09/05/11 0636  NA 133* 136  K 4.1 4.0  CL 96 100  CO2 28 25  GLUCOSE 110* 116*  BUN 6 11  CREATININE 1.05 1.05  CALCIUM 8.6 8.6     Studies/Results: Dg Abd Portable 1v  09/04/2011  *RADIOLOGY REPORT*  Clinical Data: Nasogastric tube placement.  PORTABLE ABDOMEN - 1 VIEW  Comparison: No priors.  Findings: There are surgical staples projecting over the right side of the abdomen just off the midline.  Two surgical drains are in position in both sides of the abdomen.  Small amount of gas is noted throughout the small bowel and colon.  No definite pathologic distension of bowel is noted.  No definite nasogastric tube is identified (upper abdomen is incompletely imaged).  IMPRESSION: 1.  Postoperative changes and support apparatus, as  above.  No nasogastric tube identified.  Original Report Authenticated By: Florencia Reasons, M.D.    Anti-infectives: Anti-infectives     Start     Dose/Rate Route Frequency Ordered Stop   09/04/11 2115   ceFAZolin (ANCEF) IVPB 1 g/50 mL premix        1 g 100 mL/hr over 30 Minutes Intravenous Every 8 hours 09/04/11 2102     09/03/11 1104   ceFAZolin (ANCEF) IVPB 2 g/50 mL premix        2 g 100 mL/hr over 30 Minutes Intravenous 60 min pre-op 09/03/11 1104 09/04/11 1302          Assessment/Plan: s/p Procedure(s): HERNIA REPAIR INCISIONAL Postoperative fever and elevated white blood count. Pulmonary source seems most likely. Pulmonary toilet encouraged. We'll check chest x-ray. CBC tomorrow.   LOS: 2 days    Cassius Cullinane T 09/06/2011

## 2011-09-06 NOTE — Progress Notes (Signed)
Pt with hr up in 120s o2 at 5l [place]  91- 94 %   bp 122/80 pt denies sob, or pain dr wyatt notified  Order received  Will continue to monitor

## 2011-09-07 LAB — CBC WITH DIFFERENTIAL/PLATELET
Basophils Relative: 0 % (ref 0–1)
Eosinophils Absolute: 0.2 10*3/uL (ref 0.0–0.7)
Eosinophils Relative: 2 % (ref 0–5)
HCT: 39.1 % (ref 39.0–52.0)
Hemoglobin: 13.1 g/dL (ref 13.0–17.0)
MCH: 31 pg (ref 26.0–34.0)
MCHC: 33.5 g/dL (ref 30.0–36.0)
MCV: 92.7 fL (ref 78.0–100.0)
Monocytes Absolute: 1.2 10*3/uL — ABNORMAL HIGH (ref 0.1–1.0)
Monocytes Relative: 10 % (ref 3–12)
Neutro Abs: 9.1 10*3/uL — ABNORMAL HIGH (ref 1.7–7.7)
RDW: 13 % (ref 11.5–15.5)

## 2011-09-07 LAB — URINALYSIS, ROUTINE W REFLEX MICROSCOPIC
Bilirubin Urine: NEGATIVE
Glucose, UA: NEGATIVE mg/dL
Ketones, ur: 15 mg/dL — AB
Protein, ur: 100 mg/dL — AB
pH: 6 (ref 5.0–8.0)

## 2011-09-07 LAB — URINE MICROSCOPIC-ADD ON

## 2011-09-07 MED ORDER — SODIUM CHLORIDE 0.9 % IV BOLUS (SEPSIS)
500.0000 mL | Freq: Once | INTRAVENOUS | Status: AC
Start: 2011-09-07 — End: 2011-09-07
  Administered 2011-09-07: 500 mL via INTRAVENOUS

## 2011-09-07 NOTE — Progress Notes (Signed)
3 Days Post-Op  Subjective: Feels better this morning.  Pain improving.    Objective: Vital signs in last 24 hours: Temp:  [97.9 F (36.6 C)-99.8 F (37.7 C)] 99 F (37.2 C) (08/05 0553) Pulse Rate:  [99-128] 100  (08/05 0553) Resp:  [16-22] 17  (08/05 0553) BP: (122-136)/(74-83) 136/83 mmHg (08/05 0553) SpO2:  [92 %-97 %] 97 % (08/05 0553) FiO2 (%):  [19 %-96 %] 96 % (08/04 1851) Last BM Date: 09/03/11  Intake/Output from previous day: 08/04 0701 - 08/05 0700 In: 2852 [I.V.:2350; IV Piggyback:500] Out: 2715 [Urine:1150; Emesis/NG output:1400; Drains:165] Intake/Output this shift:    General appearance: alert, cooperative and no distress Resp: clear to auscultation bilaterally Cardio: HR 105, regular GI: soft, tenderness improved, still with appropriate incisional tenderness, ND, incision without infection, JP's this ss output, no peritoneal signs Extremities: SCD's bilat  Lab Results:   Basename 09/07/11 0500 09/06/11 0525  WBC 11.6* 17.8*  HGB 13.1 13.7  HCT 39.1 41.8  PLT 245 265   BMET  Basename 09/06/11 0525 09/05/11 0636  NA 133* 136  K 4.1 4.0  CL 96 100  CO2 28 25  GLUCOSE 110* 116*  BUN 6 11  CREATININE 1.05 1.05  CALCIUM 8.6 8.6   PT/INR No results found for this basename: LABPROT:2,INR:2 in the last 72 hours ABG No results found for this basename: PHART:2,PCO2:2,PO2:2,HCO3:2 in the last 72 hours  Studies/Results: Dg Chest 2 View  09/06/2011  *RADIOLOGY REPORT*  Clinical Data: Postop fever  CHEST - 2 VIEW  Comparison: 01/24/2011  Findings: There is a nasogastric tube which is coiled in the stomach.  Heart size is mildly enlarged.  Low lung volumes and asymmetric elevation right hemidiaphragm noted.  No airspace consolidation identified.  No pleural effusions or edema.  IMPRESSION:  1.  Low lung volumes and asymmetric elevation right hemidiaphragm.  Original Report Authenticated By: Rosealee Albee, M.D.    Anti-infectives: Anti-infectives    Start     Dose/Rate Route Frequency Ordered Stop   09/04/11 2115   ceFAZolin (ANCEF) IVPB 1 g/50 mL premix        1 g 100 mL/hr over 30 Minutes Intravenous Every 8 hours 09/04/11 2102     09/03/11 1104   ceFAZolin (ANCEF) IVPB 2 g/50 mL premix        2 g 100 mL/hr over 30 Minutes Intravenous 60 min pre-op 09/03/11 1104 09/04/11 1302          Assessment/Plan: s/p Procedure(s) (LRB): HERNIA REPAIR INCISIONAL (N/A) NG with thin bilious output. awaiting bowel function prior to removal.  His pain is better today and afebrile overnight.  WBC down. Continue pulmonary toilet and mobilize today.  Seems to be improving.  uop adequate.  Some unmeasured voids.  LOS: 3 days    [name] 09/07/2011

## 2011-09-08 ENCOUNTER — Encounter (HOSPITAL_COMMUNITY): Payer: Self-pay | Admitting: General Surgery

## 2011-09-08 LAB — BASIC METABOLIC PANEL
CO2: 31 mEq/L (ref 19–32)
Calcium: 8.7 mg/dL (ref 8.4–10.5)
Potassium: 3.9 mEq/L (ref 3.5–5.1)
Sodium: 136 mEq/L (ref 135–145)

## 2011-09-08 LAB — CBC WITH DIFFERENTIAL/PLATELET
Eosinophils Relative: 4 % (ref 0–5)
Hemoglobin: 11.8 g/dL — ABNORMAL LOW (ref 13.0–17.0)
Lymphocytes Relative: 12 % (ref 12–46)
Lymphs Abs: 1.1 10*3/uL (ref 0.7–4.0)
MCV: 92.8 fL (ref 78.0–100.0)
Monocytes Relative: 12 % (ref 3–12)
Neutrophils Relative %: 73 % (ref 43–77)
Platelets: 299 10*3/uL (ref 150–400)
RBC: 3.88 MIL/uL — ABNORMAL LOW (ref 4.22–5.81)
WBC: 9.3 10*3/uL (ref 4.0–10.5)

## 2011-09-08 MED ORDER — ZOLPIDEM TARTRATE 5 MG PO TABS: 5.0000 mg | ORAL_TABLET | Freq: Once | ORAL | Status: DC | PRN

## 2011-09-08 MED ORDER — PHENOL 1.4 % MT LIQD
2.0000 | OROMUCOSAL | Status: DC | PRN
  Administered 2011-09-08: 2 via OROMUCOSAL
  Filled 2011-09-08: qty 177

## 2011-09-08 NOTE — Progress Notes (Signed)
4 Days Post-Op  Subjective: Feels "sore", still no flatus or BM  Objective: Vital signs in last 24 hours: Temp:  [98.5 F (36.9 C)-99.9 F (37.7 C)] 98.5 F (36.9 C) (08/06 0517) Pulse Rate:  [93-102] 93  (08/06 0517) Resp:  [13-20] 18  (08/06 0517) BP: (130-144)/(74-83) 143/81 mmHg (08/06 0517) SpO2:  [92 %-96 %] 94 % (08/06 0517) Last BM Date: 09/03/11  Intake/Output from previous day: 08/05 0701 - 08/06 0700 In: 2899.1 [I.V.:2899.1] Out: 2010 [Urine:950; Emesis/NG output:970; Drains:90] Intake/Output this shift:    General appearance: alert, cooperative and no distress Resp: clear to auscultation bilaterally Cardio: regular rate and rhythm, S1, S2 normal, no murmur, click, rub or gallop GI: soft, appropriate tenderness, ND, incision without infection, JP's thin SS, NG with bilious output but watery Extremities: SCD's bilat LE  Lab Results:   Basename 09/08/11 0515 09/07/11 0500  WBC 9.3 11.6*  HGB 11.8* 13.1  HCT 36.0* 39.1  PLT 299 245   BMET  Basename 09/06/11 0525  NA 133*  K 4.1  CL 96  CO2 28  GLUCOSE 110*  BUN 6  CREATININE 1.05  CALCIUM 8.6   PT/INR No results found for this basename: LABPROT:2,INR:2 in the last 72 hours ABG No results found for this basename: PHART:2,PCO2:2,PO2:2,HCO3:2 in the last 72 hours  Studies/Results: Dg Chest 2 View  09/06/2011  *RADIOLOGY REPORT*  Clinical Data: Postop fever  CHEST - 2 VIEW  Comparison: 01/24/2011  Findings: There is a nasogastric tube which is coiled in the stomach.  Heart size is mildly enlarged.  Low lung volumes and asymmetric elevation right hemidiaphragm noted.  No airspace consolidation identified.  No pleural effusions or edema.  IMPRESSION:  1.  Low lung volumes and asymmetric elevation right hemidiaphragm.  Original Report Authenticated By: Rosealee Albee, M.D.    Anti-infectives: Anti-infectives     Start     Dose/Rate Route Frequency Ordered Stop   09/04/11 2115   ceFAZolin (ANCEF) IVPB 1  g/50 mL premix        1 g 100 mL/hr over 30 Minutes Intravenous Every 8 hours 09/04/11 2102     09/03/11 1104   ceFAZolin (ANCEF) IVPB 2 g/50 mL premix        2 g 100 mL/hr over 30 Minutes Intravenous 60 min pre-op 09/03/11 1104 09/04/11 1302          Assessment/Plan: s/p Procedure(s) (LRB): HERNIA REPAIR INCISIONAL (N/A) still awaiting bowel function.  will try to clamp NG today.  Mobilize.  HR <100, seems to be improving.  LOS: 4 days    [name] 09/08/2011

## 2011-09-09 MED ORDER — OXYCODONE-ACETAMINOPHEN 5-325 MG PO TABS
2.0000 | ORAL_TABLET | Freq: Four times a day (QID) | ORAL | Status: DC | PRN
  Administered 2011-09-09 – 2011-09-10 (×3): 2 via ORAL
  Filled 2011-09-09 (×4): qty 2

## 2011-09-09 MED ORDER — MORPHINE SULFATE 2 MG/ML IJ SOLN
1.0000 mg | INTRAMUSCULAR | Status: DC | PRN
  Administered 2011-09-09 – 2011-09-10 (×2): 2 mg via INTRAVENOUS
  Filled 2011-09-09 (×2): qty 1

## 2011-09-09 NOTE — Progress Notes (Signed)
5 Days Post-Op  Subjective: Continues to improve.  No nausea with NG clamped all day   Objective: Vital signs in last 24 hours: Temp:  [98 F (36.7 C)-100.3 F (37.9 C)] 98 F (36.7 C) (08/07 0620) Pulse Rate:  [88-107] 101  (08/07 0620) Resp:  [14-18] 18  (08/07 0800) BP: (144-156)/(81-88) 156/81 mmHg (08/07 0620) SpO2:  [93 %-99 %] 98 % (08/07 0800) Last BM Date: 09/04/11  Intake/Output from previous day: 08/06 0701 - 08/07 0700 In: 3064.2 [I.V.:3004.2; NG/GT:10; IV Piggyback:50] Out: 2200 [Urine:2050; Emesis/NG output:100; Drains:50] Intake/Output this shift: Total I/O In: 0  Out: 875 [Urine:875]  General appearance: alert, cooperative and no distress Resp: clear to auscultation bilaterally Cardio: HR 95, regular GI: soft, incisional tenderness, ND, wound without infection, left JP removed, no peritoneal signs Extremities: SCD's removed  Lab Results:   Basename 09/08/11 0515 09/07/11 0500  WBC 9.3 11.6*  HGB 11.8* 13.1  HCT 36.0* 39.1  PLT 299 245   BMET  Basename 09/08/11 0515  NA 136  K 3.9  CL 100  CO2 31  GLUCOSE 114*  BUN 7  CREATININE 0.91  CALCIUM 8.7   PT/INR No results found for this basename: LABPROT:2,INR:2 in the last 72 hours ABG No results found for this basename: PHART:2,PCO2:2,PO2:2,HCO3:2 in the last 72 hours  Studies/Results: No results found.  Anti-infectives: Anti-infectives     Start     Dose/Rate Route Frequency Ordered Stop   09/04/11 2115   ceFAZolin (ANCEF) IVPB 1 g/50 mL premix        1 g 100 mL/hr over 30 Minutes Intravenous Every 8 hours 09/04/11 2102     09/03/11 1104   ceFAZolin (ANCEF) IVPB 2 g/50 mL premix        2 g 100 mL/hr over 30 Minutes Intravenous 60 min pre-op 09/03/11 1104 09/04/11 1302          Assessment/Plan: s/p Procedure(s) (LRB): HERNIA REPAIR INCISIONAL (N/A) Advance diet doing better, NG removed and 1 drain removed.  will trial clears and oral pain meds.  LOS: 5 days    [name]  [name] 09/09/2011

## 2011-09-10 MED ORDER — CALCIUM CARBONATE ANTACID 500 MG PO CHEW
1.0000 | CHEWABLE_TABLET | Freq: Three times a day (TID) | ORAL | Status: DC | PRN
  Administered 2011-09-10: 200 mg via ORAL
  Filled 2011-09-10 (×4): qty 1

## 2011-09-10 NOTE — Progress Notes (Signed)
6 Days Post-Op  Subjective: Pain much improved.  Tolerating liquid diet.  Objective: Vital signs in last 24 hours: Temp:  [98.4 F (36.9 C)-100.1 F (37.8 C)] 98.8 F (37.1 C) (08/08 0511) Pulse Rate:  [91-103] 103  (08/08 0511) Resp:  [16-20] 18  (08/08 0511) BP: (126-151)/(81-86) 133/84 mmHg (08/08 0511) SpO2:  [93 %-100 %] 95 % (08/08 0511) Last BM Date: 09/09/11  Intake/Output from previous day: 08/07 0701 - 08/08 0700 In: 2843 [P.O.:300; I.V.:2543] Out: 1325 [Urine:1275; Drains:50] Intake/Output this shift:    General appearance: alert, cooperative and no distress Resp: clear to auscultation bilaterally Cardio: normal rate, regular GI: soft, minimal incisional tenderness, ND, no sign of infection, JP SS, no peritoneal signs Extremities: extremities normal, atraumatic, no cyanosis or edema  Lab Results:   Basename 09/08/11 0515  WBC 9.3  HGB 11.8*  HCT 36.0*  PLT 299   BMET  Basename 09/08/11 0515  NA 136  K 3.9  CL 100  CO2 31  GLUCOSE 114*  BUN 7  CREATININE 0.91  CALCIUM 8.7   PT/INR No results found for this basename: LABPROT:2,INR:2 in the last 72 hours ABG No results found for this basename: PHART:2,PCO2:2,PO2:2,HCO3:2 in the last 72 hours  Studies/Results: No results found.  Anti-infectives: Anti-infectives     Start     Dose/Rate Route Frequency Ordered Stop   09/04/11 2115   ceFAZolin (ANCEF) IVPB 1 g/50 mL premix        1 g 100 mL/hr over 30 Minutes Intravenous Every 8 hours 09/04/11 2102     09/03/11 1104   ceFAZolin (ANCEF) IVPB 2 g/50 mL premix        2 g 100 mL/hr over 30 Minutes Intravenous 60 min pre-op 09/03/11 1104 09/04/11 1302          Assessment/Plan: s/p Procedure(s) (LRB): HERNIA REPAIR INCISIONAL (N/A) Advance diet he continues to do well.  should be okay to remove drain and discharge to home tomorrow if tolerating diet.  LOS: 6 days    Lodema Pilot Cormac 09/10/2011

## 2011-09-11 MED ORDER — DOCUSATE SODIUM 100 MG PO CAPS: 100.0000 mg | ORAL_CAPSULE | Freq: Two times a day (BID) | ORAL | Status: AC | PRN

## 2011-09-11 MED ORDER — PANTOPRAZOLE SODIUM 40 MG PO TBEC: 40.0000 mg | DELAYED_RELEASE_TABLET | Freq: Every day | ORAL | Status: DC

## 2011-09-11 MED ORDER — OXYCODONE-ACETAMINOPHEN 5-325 MG PO TABS: 2.0000 | ORAL_TABLET | Freq: Four times a day (QID) | ORAL | Status: AC | PRN

## 2011-09-11 NOTE — Progress Notes (Signed)
DC HOME WITH BROTHER. VERBALLY UNDERSTOOD DC INSTRUCTIONS. NO QUESTIONS ASKED 

## 2011-09-11 NOTE — Discharge Summary (Signed)
  Physician Discharge Summary  Patient ID: [name] MRN: [id number] DOB/AGE: 1970-04-04 41 y.o.  Admit date: 09/04/2011 Discharge date: 09/11/2011  Admission Diagnoses: ventral hernia  Discharge Diagnoses: same Active Problems:  * No active hospital problems. *    Discharged Condition: good  Hospital Course: to OR 09/04/11 for open ventral hernia repair.  No complications.  NG tube left and left NPO until bowel function.  Then diet slowly advanced.  Pain control and ambulatory. Stable for discharge on POD 6  Consults: None  Significant Diagnostic Studies: none  Treatments: surgery: open ventral hernia repair with mesh  Disposition: 01-Home or Self Care  Discharge Orders    Future Orders Please Complete By Expires   Diet - low sodium heart healthy      Increase activity slowly      Discharge instructions      Comments:   May shower starting tomorrow 09/12/11 Call [phone] for follow up appointment for staple removal in 1 week. No lifting more than 10lbs for 4 weeks May wear abdominal binder as desired   Call MD for:  temperature >100.4      Call MD for:  persistant nausea and vomiting      Call MD for:  severe uncontrolled pain      Call MD for:  redness, tenderness, or signs of infection (pain, swelling, redness, odor or green/yellow discharge around incision site)      Call MD for:  difficulty breathing, headache or visual disturbances        Medication List  As of 09/11/2011  7:16 AM   STOP taking these medications         acetaminophen 325 MG tablet      HYDROcodone-acetaminophen 5-325 MG per tablet         TAKE these medications         ALPRAZolam 0.25 MG tablet   Commonly known as: XANAX   Take 0.25 mg by mouth 3 (three) times daily as needed. For anxiety.      docusate sodium 100 MG capsule   Commonly known as: COLACE   Take 1 capsule (100 mg total) by mouth 2 (two) times daily as needed for constipation.      oxyCODONE-acetaminophen 5-325 MG per tablet    Commonly known as: PERCOCET/ROXICET   Take 2 tablets by mouth every 6 (six) hours as needed.      pantoprazole 40 MG tablet   Commonly known as: PROTONIX   Take 1 tablet (40 mg total) by mouth 2 (two) times daily.             SignedLodema Pilot [name] 09/11/2011, 7:16 AM

## 2011-09-11 NOTE — Progress Notes (Signed)
7 Days Post-Op  Subjective: No issues.  Bowels functioning  Objective: Vital signs in last 24 hours: Temp:  [98.1 F (36.7 C)-99.3 F (37.4 C)] 98.1 F (36.7 C) (08/09 0548) Pulse Rate:  [84-87] 84  (08/09 0548) Resp:  [16-18] 18  (08/09 0548) BP: (130-137)/(78-95) 137/86 mmHg (08/09 0548) SpO2:  [95 %-97 %] 97 % (08/09 0548) Last BM Date: 09/09/11  Intake/Output from previous day: 08/08 0701 - 08/09 0700 In: 1020 [P.O.:960; IV Piggyback:60] Out: 25 [Drains:25] Intake/Output this shift:    General appearance: alert, cooperative and no distress Resp: clear to auscultation bilaterally Cardio: regular rate and rhythm, S1, S2 normal, no murmur, click, rub or gallop GI: soft, minimal tenderness, ND, incisions without infection, JP removed.  Lab Results:  No results found for this basename: WBC:2,HGB:2,HCT:2,PLT:2 in the last 72 hours BMET No results found for this basename: NA:2,K:2,CL:2,CO2:2,GLUCOSE:2,BUN:2,CREATININE:2,CALCIUM:2 in the last 72 hours PT/INR No results found for this basename: LABPROT:2,INR:2 in the last 72 hours ABG No results found for this basename: PHART:2,PCO2:2,PO2:2,HCO3:2 in the last 72 hours  Studies/Results: No results found.  Anti-infectives: Anti-infectives     Start     Dose/Rate Route Frequency Ordered Stop   09/04/11 2115   ceFAZolin (ANCEF) IVPB 1 g/50 mL premix        1 g 100 mL/hr over 30 Minutes Intravenous Every 8 hours 09/04/11 2102     09/03/11 1104   ceFAZolin (ANCEF) IVPB 2 g/50 mL premix        2 g 100 mL/hr over 30 Minutes Intravenous 60 min pre-op 09/03/11 1104 09/04/11 1302          Assessment/Plan: s/p Procedure(s) (LRB): HERNIA REPAIR INCISIONAL (N/A) he looks and feels well.  will plan for discharge today  LOS: 7 days    Lodema Pilot Judson 09/11/2011

## 2011-09-11 NOTE — Progress Notes (Signed)
Pt has changed mind about receiving vaccination.  Refused.

## 2011-09-12 LAB — CULTURE, BLOOD (ROUTINE X 2): Culture: NO GROWTH

## 2011-09-16 ENCOUNTER — Ambulatory Visit (INDEPENDENT_AMBULATORY_CARE_PROVIDER_SITE_OTHER): Payer: Commercial Managed Care - PPO | Admitting: General Surgery

## 2011-09-16 ENCOUNTER — Encounter (INDEPENDENT_AMBULATORY_CARE_PROVIDER_SITE_OTHER): Payer: Self-pay | Admitting: General Surgery

## 2011-09-16 VITALS — BP 132/100 | HR 100 | Ht 69.0 in | Wt 216.8 lb

## 2011-09-16 DIAGNOSIS — Z4802 Encounter for removal of sutures: Secondary | ICD-10-CM

## 2011-09-16 NOTE — Progress Notes (Signed)
Patient comes in today s/p open incisional hernia repair on 09/04/11 for staple removal...zero signs of infection, area clean and dry with no drainage or redness.Cherlynn Polo were removed intact..Incision intact and pt tolerated very well..the patient has follow up appt with dr.Layton on 09/30/11 at 1:00..the patient was advised to call the office if needed.

## 2011-09-30 ENCOUNTER — Ambulatory Visit (INDEPENDENT_AMBULATORY_CARE_PROVIDER_SITE_OTHER): Payer: Commercial Managed Care - PPO | Admitting: General Surgery

## 2011-09-30 ENCOUNTER — Encounter (INDEPENDENT_AMBULATORY_CARE_PROVIDER_SITE_OTHER): Payer: Self-pay | Admitting: General Surgery

## 2011-09-30 VITALS — BP 144/82 | HR 92 | Temp 97.8°F | Resp 16 | Ht 69.0 in | Wt 214.6 lb

## 2011-09-30 DIAGNOSIS — Z5189 Encounter for other specified aftercare: Secondary | ICD-10-CM

## 2011-09-30 DIAGNOSIS — Z4889 Encounter for other specified surgical aftercare: Secondary | ICD-10-CM

## 2011-09-30 NOTE — Progress Notes (Signed)
Subjective:     Patient ID: [name], male   DOB: [birth year], 41 y.o.   MRN: [id number]  HPI  The patient follows up almost 4 weeks status post open ventral hernia repair with mesh. He seems to be doing very well from his surgery and is off his pain medication. He has returned to several physical activities and has even played a little softball.  He has some pinpoint discomfort in his right lower quadrant but no bulge. He is tolerating regular diet and moving his bowels without difficulty. He has some occasional diarrhea. His pathology from intraoperative biopsies was benign Review of Systems     Objective:   Physical Exam No acute distress and nontoxic-appearing His incision is well-healed without sign of infection. He has no evidence of recurrent hernia and no bulge with Valsalva. He has some pinpoint tenderness at 1 transfacial suture sites in the right lower quadrant and I can feel a palpable suture in this area as well    Assessment:     Status post open incisional hernia repair with mesh-doing well He seems to be doing very well since his surgery and there is no evidence of any postoperative complication. He feels much better from a pain standpoint. He does have some tenderness at one of the transfacial suture sites which I think will improve with time. If his tenderness is not improve after 6 months, then we can consider local excision of this suture after the mesh is secured. Otherwise, he can gradually increase his activity as tolerated. I did recommend that he refrain from lifting and softball for another at least 2 weeks and then he can gradually increase his activity as tolerated    Plan:     He seems to be doing very well and can gradually increase his activity as tolerated. I will see him back on a when necessary basis.

## 2011-10-07 ENCOUNTER — Other Ambulatory Visit: Payer: Self-pay

## 2011-10-07 MED ORDER — ALPRAZOLAM 0.25 MG PO TABS: 0.2500 mg | ORAL_TABLET | Freq: Three times a day (TID) | ORAL | Status: DC | PRN

## 2011-10-07 NOTE — Telephone Encounter (Signed)
Refill called to [facility]. 

## 2011-10-07 NOTE — Telephone Encounter (Signed)
Pt request refill alprazolam to [facility].Please advise.

## 2011-10-22 ENCOUNTER — Telehealth (INDEPENDENT_AMBULATORY_CARE_PROVIDER_SITE_OTHER): Payer: Self-pay

## 2011-10-22 NOTE — Telephone Encounter (Signed)
Pt request sooner appt than Oct 2  to see Dr Biagio Quint. Pt states he has a popping sound in his sternum when he moves. No pain, swelling or color change in area.  He states he had area of bx at the sternal edge when he had hernia repair. I advised him I will send msg to [facility] and Dr Biagio Quint to see when appt can be moved up.

## 2011-10-23 NOTE — Telephone Encounter (Signed)
Rescheduled appointment per patient request for 10/29/11 @ 9:00 am w/Dr. Biagio Quint.

## 2011-10-27 ENCOUNTER — Other Ambulatory Visit: Payer: Self-pay

## 2011-10-27 MED ORDER — ALPRAZOLAM 0.25 MG PO TABS: 0.2500 mg | ORAL_TABLET | Freq: Three times a day (TID) | ORAL | Status: DC | PRN

## 2011-10-27 NOTE — Telephone Encounter (Signed)
Pt request refill alprazolam to [facility].Please advise.

## 2011-10-27 NOTE — Telephone Encounter (Signed)
Medicine called to pharmacy. 

## 2011-10-29 ENCOUNTER — Ambulatory Visit (HOSPITAL_COMMUNITY)
Admission: RE | Admit: 2011-10-29 | Discharge: 2011-10-29 | Disposition: A | Discharge status: Home / Self Care | Payer: 59 | Source: Ambulatory Visit | Attending: General Surgery | Admitting: General Surgery

## 2011-10-29 ENCOUNTER — Ambulatory Visit (INDEPENDENT_AMBULATORY_CARE_PROVIDER_SITE_OTHER): Payer: Commercial Managed Care - PPO | Admitting: General Surgery

## 2011-10-29 ENCOUNTER — Encounter (INDEPENDENT_AMBULATORY_CARE_PROVIDER_SITE_OTHER): Payer: Self-pay | Admitting: General Surgery

## 2011-10-29 VITALS — BP 147/82 | HR 76 | Temp 97.3°F | Resp 16 | Ht 69.0 in | Wt 215.0 lb

## 2011-10-29 DIAGNOSIS — R079 Chest pain, unspecified: Secondary | ICD-10-CM

## 2011-10-29 DIAGNOSIS — R072 Precordial pain: Secondary | ICD-10-CM | POA: Insufficient documentation

## 2011-10-29 DIAGNOSIS — Z9889 Other specified postprocedural states: Secondary | ICD-10-CM | POA: Insufficient documentation

## 2011-10-29 DIAGNOSIS — R0789 Other chest pain: Secondary | ICD-10-CM

## 2011-10-29 DIAGNOSIS — R0989 Other specified symptoms and signs involving the circulatory and respiratory systems: Secondary | ICD-10-CM | POA: Insufficient documentation

## 2011-10-29 NOTE — Progress Notes (Signed)
Subjective:     Patient ID: [name], male   DOB: 08/15/1970, 41 y.o.   MRN: [id number]  HPI F/u after ventral hernia repair with mesh.  He has done well from this procedure and his pain has improved and his feels dramatically better than preoperatively. However, he says that he has a clicking noise and grinding in the area of his upper abdomen and lower sternum with movement. It is not cause any particular discomfort but is more annoying for him.  Review of Systems     Objective:   Physical Exam No acute distress and nontoxic-appearing His incision is healed nicely and there is no evidence of recurrent hernia. He does have a grinding and crepitus noise in the area of the xiphoid in the area of excision of that calcification of his incision an extension from the xiphoid    Assessment:     Status post ventral hernia repair-developed I'm not sure if he has recurrent calcification of the incision and not really sure what is causing this grinding noise. I have recommended a chest x-ray to evaluate and if this shows recurrent calcification then I would send it to  thoracic surgery for evaluation and treatment. Otherwise there is no evidence of recurrent hernia and he seems to be doing well from this.    Plan:     We will check an x-ray and if abnormal, we will refer him to thoracic surgery for evaluation.

## 2011-11-04 ENCOUNTER — Encounter (INDEPENDENT_AMBULATORY_CARE_PROVIDER_SITE_OTHER): Payer: Commercial Managed Care - PPO | Admitting: General Surgery

## 2011-11-12 ENCOUNTER — Other Ambulatory Visit: Payer: Self-pay

## 2011-11-12 MED ORDER — ALPRAZOLAM 0.25 MG PO TABS: 0.2500 mg | ORAL_TABLET | Freq: Three times a day (TID) | ORAL | Status: DC | PRN

## 2011-11-12 NOTE — Telephone Encounter (Signed)
Pt request refill alprazolam to [facility].Please advise.

## 2011-11-12 NOTE — Telephone Encounter (Signed)
Medicine called to pharmacy. 

## 2011-11-18 ENCOUNTER — Ambulatory Visit (INDEPENDENT_AMBULATORY_CARE_PROVIDER_SITE_OTHER): Payer: Commercial Managed Care - PPO | Admitting: Family Medicine

## 2011-11-18 ENCOUNTER — Other Ambulatory Visit (INDEPENDENT_AMBULATORY_CARE_PROVIDER_SITE_OTHER): Payer: Self-pay

## 2011-11-18 ENCOUNTER — Encounter: Payer: Self-pay | Admitting: Family Medicine

## 2011-11-18 VITALS — BP 120/84 | HR 88 | Temp 97.9°F | Wt 214.0 lb

## 2011-11-18 DIAGNOSIS — F411 Generalized anxiety disorder: Secondary | ICD-10-CM

## 2011-11-18 DIAGNOSIS — R9389 Abnormal findings on diagnostic imaging of other specified body structures: Secondary | ICD-10-CM

## 2011-11-18 DIAGNOSIS — F419 Anxiety disorder, unspecified: Secondary | ICD-10-CM

## 2011-11-18 DIAGNOSIS — R0789 Other chest pain: Secondary | ICD-10-CM

## 2011-11-18 MED ORDER — SERTRALINE HCL 50 MG PO TABS: ORAL_TABLET | ORAL | Status: DC

## 2011-11-18 MED ORDER — ZOLPIDEM TARTRATE 10 MG PO TABS: 10.0000 mg | ORAL_TABLET | Freq: Every evening | ORAL | Status: DC | PRN

## 2011-11-18 NOTE — Progress Notes (Signed)
Subjective:     Patient ID: [name], male   DOB: 1970-08-19, 41 y.o.   MRN: [id number]  HPI Very pleasant 41 year old male here to discuss anxiety.  He has had panic attacks since moving to this area- takes prn xanax occasionally. Past month or so he is starting to feel more anxious.  No sleeping well.  Appetite decreased. He does admit to feeling a little depressed. No SI or HI.  Strong FH of depression and anxiety.  Never been on any SSRIs or other antidepressants.    Patient Active Problem List  Diagnosis  . Gastric ulcer  . DJD (degenerative joint disease)  . Ventral hernia  . H/O: upper GI bleed  . Anxiety   Past Medical History  Diagnosis Date  . Upper GI bleed   . Ventral hernia   . Hiatal hernia   . History of blood clots 2012    in abdomen  . Dizziness     occasionally  . Joint pain     knees  . GERD (gastroesophageal reflux disease)     takes Protonix bid  . Gastric ulcer     history of  . Constipation     related to pain meds  . Nocturia     depends on amount of fluid he drinks  . History of blood transfusion 2012  . Anxiety     takes Xanax daily prn  . External hemorrhoid, bleeding    Past Surgical History  Procedure Date  . Abdominal surgery 10/2010    d/t bleeding ulcers; "oversewing of gastric ulcer"  . Esophagogastroduodenoscopy   . Hernia repair 09/04/11    incisional   . Incisional hernia repair 09/04/2011    Procedure: HERNIA REPAIR INCISIONAL;  Surgeon: Lodema Pilot, DO;  Location: [facility] OR;  Service: General;  Laterality: N/A;  debridment calcified mass   History  Substance Use Topics  . Smoking status: Never Smoker   . Smokeless tobacco: Current User    Types: Chew  . Alcohol Use: Yes     09/04/11 "very seldom"   Family History  Problem Relation Age of Onset  . Diabetes Mother   . Diabetes Father   . Heart disease Father    Allergies  Allergen Reactions  . Dilaudid (Hydromorphone Hcl) Rash and Other (See Comments)    Shakey    . Metoclopramide Hcl Hives, Swelling and Rash    Swelling in lips   Current Outpatient Prescriptions on File Prior to Visit  Medication Sig Dispense Refill  . ALPRAZolam (XANAX) 0.25 MG tablet Take 1 tablet (0.25 mg total) by mouth 3 (three) times daily as needed. For anxiety.  30 tablet  0  . pantoprazole (PROTONIX) 40 MG tablet Take 1 tablet (40 mg total) by mouth 2 (two) times daily.  180 tablet  3  . sertraline (ZOLOFT) 50 MG tablet Take 25 mg (1/2 tablet ) for first 3 days, then increase to 1 tablet by mouth daily  30 tablet  3  . zolpidem (AMBIEN) 10 MG tablet Take 1 tablet (10 mg total) by mouth at bedtime as needed for sleep.  30 tablet  1   The PMH, PSH, Social History, Family History, Medications, and allergies have been reviewed in [facility], and have been updated if relevant.  ROS: See HPI     Objective:   Physical Exam BP 120/84  Pulse 88  Temp 97.9 F (36.6 C)  Wt 214 lb (97.07 kg) General:  Pleasant male in NAD Psych:  Good eye contact, mildly anxious appearing, pleasant and conversant      Assessment/Plan:   1. Anxiety    Deteriorated. >25 min spent with face to face with patient, >50% counseling and/or coordinating care Start Zoloft, short course of ambien for insomnia. Continue prn xanax. Follow up in one month.

## 2011-11-18 NOTE — Patient Instructions (Addendum)
Good to see you. You can make an appointment with Dr. Patsy Lager for your knees.  Call me in 3-4 weeks with an update.

## 2011-11-20 ENCOUNTER — Ambulatory Visit: Payer: Commercial Managed Care - PPO | Admitting: Family Medicine

## 2011-12-07 NOTE — Progress Notes (Signed)
Call and left message for patient to contact our office.  Patient need's CT of chest

## 2012-01-05 ENCOUNTER — Encounter: Payer: Self-pay | Admitting: Family Medicine

## 2012-01-05 ENCOUNTER — Ambulatory Visit (INDEPENDENT_AMBULATORY_CARE_PROVIDER_SITE_OTHER): Payer: 59 | Admitting: Family Medicine

## 2012-01-05 VITALS — BP 124/82 | Temp 98.3°F | Wt 215.0 lb

## 2012-01-05 DIAGNOSIS — F411 Generalized anxiety disorder: Secondary | ICD-10-CM

## 2012-01-05 DIAGNOSIS — F419 Anxiety disorder, unspecified: Secondary | ICD-10-CM

## 2012-01-05 MED ORDER — VENLAFAXINE HCL 37.5 MG PO TABS: ORAL_TABLET | ORAL | Status: DC

## 2012-01-05 MED ORDER — ZOLPIDEM TARTRATE 10 MG PO TABS: 10.0000 mg | ORAL_TABLET | Freq: Every evening | ORAL | Status: DC | PRN

## 2012-01-05 NOTE — Patient Instructions (Addendum)
Good to see you. Please wean off the zoloft: 1/2 tablet every other day for 1 week.  We are starting Effexor- 1 tablet daily and then increase to 1 tablet twice daily in 1 week.

## 2012-01-05 NOTE — Progress Notes (Signed)
Subjective:     Patient ID: [name], male   DOB: [birth year], 41 y.o.   MRN: [id number]  HPI Very pleasant 41 year old male here to discuss anxiety.  He has had panic attacks since moving to this area- takes prn xanax occasionally. Started Zoloft and Ambien in October 2013.  Past month or so he is starting to feel more anxious.  No sleeping well.  Appetite decreased. He does admit to feeling a little depressed.  He feels zoloft "makes me feel more anxious." No SI or HI.  Strong FH of depression and anxiety.   Patient Active Problem List  Diagnosis  . Gastric ulcer  . DJD (degenerative joint disease)  . Ventral hernia  . H/O: upper GI bleed  . Anxiety   Past Medical History  Diagnosis Date  . Upper GI bleed   . Ventral hernia   . Hiatal hernia   . History of blood clots 2012    in abdomen  . Dizziness     occasionally  . Joint pain     knees  . GERD (gastroesophageal reflux disease)     takes Protonix bid  . Gastric ulcer     history of  . Constipation     related to pain meds  . Nocturia     depends on amount of fluid he drinks  . History of blood transfusion 2012  . Anxiety     takes Xanax daily prn  . External hemorrhoid, bleeding    Past Surgical History  Procedure Date  . Abdominal surgery 10/2010    d/t bleeding ulcers; "oversewing of gastric ulcer"  . Esophagogastroduodenoscopy   . Hernia repair 09/04/11    incisional   . Incisional hernia repair 09/04/2011    Procedure: HERNIA REPAIR INCISIONAL;  Surgeon: Lodema Pilot, DO;  Location: [facility] OR;  Service: General;  Laterality: N/A;  debridment calcified mass   History  Substance Use Topics  . Smoking status: Never Smoker   . Smokeless tobacco: Current User    Types: Chew  . Alcohol Use: Yes     Comment: 09/04/11 "very seldom"   Family History  Problem Relation Age of Onset  . Diabetes Mother   . Diabetes Father   . Heart disease Father    Allergies  Allergen Reactions  . Dilaudid (Hydromorphone  Hcl) Rash and Other (See Comments)    Shakey  . Metoclopramide Hcl Hives, Swelling and Rash    Swelling in lips   Current Outpatient Prescriptions on File Prior to Visit  Medication Sig Dispense Refill  . ALPRAZolam (XANAX) 0.25 MG tablet Take 1 tablet (0.25 mg total) by mouth 3 (three) times daily as needed. For anxiety.  30 tablet  0  . pantoprazole (PROTONIX) 40 MG tablet Take 1 tablet (40 mg total) by mouth 2 (two) times daily.  180 tablet  3  . sertraline (ZOLOFT) 50 MG tablet Take 25 mg (1/2 tablet ) for first 3 days, then increase to 1 tablet by mouth daily  30 tablet  3  . [DISCONTINUED] zolpidem (AMBIEN) 10 MG tablet Take 1 tablet (10 mg total) by mouth at bedtime as needed for sleep.  30 tablet  1   The PMH, PSH, Social History, Family History, Medications, and allergies have been reviewed in [facility], and have been updated if relevant.  ROS: See HPI     Objective:   Physical Exam BP 124/82  Temp 98.3 F (36.8 C)  Wt 215 lb (97.523 kg) General:  Pleasant male in NAD Psych:  Good eye contact, mildly anxious appearing, pleasant and conversant      Assessment/Plan:   1. Anxiety    >15  min spent with face to face with patient, >50% counseling and/or coordinating care Will wean off Coloft and start Effexor 37.5 mg twice daily.  He is deferring psychotherapy at this time.

## 2012-02-04 ENCOUNTER — Other Ambulatory Visit: Payer: Self-pay | Admitting: Family Medicine

## 2012-02-04 MED ORDER — VENLAFAXINE HCL 37.5 MG PO TABS: ORAL_TABLET | ORAL | Status: DC

## 2012-02-15 ENCOUNTER — Other Ambulatory Visit: Payer: Self-pay | Admitting: Family Medicine

## 2012-02-15 MED ORDER — ZOLPIDEM TARTRATE 10 MG PO TABS: 10.0000 mg | ORAL_TABLET | Freq: Every evening | ORAL | Status: DC | PRN

## 2012-02-15 NOTE — Telephone Encounter (Signed)
Rx called to pharmacy

## 2012-02-17 ENCOUNTER — Ambulatory Visit (INDEPENDENT_AMBULATORY_CARE_PROVIDER_SITE_OTHER): Payer: 59 | Admitting: Family Medicine

## 2012-02-17 ENCOUNTER — Encounter: Payer: Self-pay | Admitting: Family Medicine

## 2012-02-17 VITALS — BP 124/80 | HR 72 | Temp 98.1°F | Wt 217.0 lb

## 2012-02-17 DIAGNOSIS — R1031 Right lower quadrant pain: Secondary | ICD-10-CM

## 2012-02-17 DIAGNOSIS — K625 Hemorrhage of anus and rectum: Secondary | ICD-10-CM

## 2012-02-17 MED ORDER — HYDROCORTISONE ACETATE 25 MG RE SUPP: 25.0000 mg | Freq: Two times a day (BID) | RECTAL | Status: DC

## 2012-02-17 NOTE — Patient Instructions (Addendum)
Please stop by to see Shirlee Limerick on your way out to set up your CT scan. Use proctosol for your hemorroid. Try warm baths as well.

## 2012-02-17 NOTE — Progress Notes (Signed)
Subjective:    Patient ID: [name], male    DOB: 02-03-1970, 42 y.o.   MRN: [id number]  HPI  42 yo with h/o Upper GIB in 05/2011 and large ventral hernia repair in 09/2011 here for 1 week of blood in stool.  Has had some diarrhea this past week.  Since diarrhea started, has noticed some bright red blood on paper when he wipes and a little bit in the toilet bowl. No dark stools. No dizziness. No nausea or vomiting.  Had a hemorrhoid years ago. Not having any rectal pain.  He is having left lower quadrant pain- very TTP.  H/o large ventral hernia repair in 09/2011.  He can feel a palpable mass that he thinks is new.  Patient Active Problem List  Diagnosis  . Gastric ulcer  . DJD (degenerative joint disease)  . Ventral hernia  . H/O: upper GI bleed  . Anxiety   Past Medical History  Diagnosis Date  . Upper GI bleed   . Ventral hernia   . Hiatal hernia   . History of blood clots 2012    in abdomen  . Dizziness     occasionally  . Joint pain     knees  . GERD (gastroesophageal reflux disease)     takes Protonix bid  . Gastric ulcer     history of  . Constipation     related to pain meds  . Nocturia     depends on amount of fluid he drinks  . History of blood transfusion 2012  . Anxiety     takes Xanax daily prn  . External hemorrhoid, bleeding    Past Surgical History  Procedure Date  . Abdominal surgery 10/2010    d/t bleeding ulcers; "oversewing of gastric ulcer"  . Esophagogastroduodenoscopy   . Hernia repair 09/04/11    incisional   . Incisional hernia repair 09/04/2011    Procedure: HERNIA REPAIR INCISIONAL;  Surgeon: Lodema Pilot, DO;  Location: [facility] OR;  Service: General;  Laterality: N/A;  debridment calcified mass   History  Substance Use Topics  . Smoking status: Never Smoker   . Smokeless tobacco: Current User    Types: Chew  . Alcohol Use: Yes     Comment: 09/04/11 "very seldom"   Family History  Problem Relation Age of Onset  . Diabetes Mother     . Diabetes Father   . Heart disease Father    Allergies  Allergen Reactions  . Dilaudid (Hydromorphone Hcl) Rash and Other (See Comments)    Shakey  . Metoclopramide Hcl Hives, Swelling and Rash    Swelling in lips   Current Outpatient Prescriptions on File Prior to Visit  Medication Sig Dispense Refill  . ALPRAZolam (XANAX) 0.25 MG tablet Take 1 tablet (0.25 mg total) by mouth 3 (three) times daily as needed. For anxiety.  30 tablet  0  . pantoprazole (PROTONIX) 40 MG tablet Take 1 tablet (40 mg total) by mouth 2 (two) times daily.  180 tablet  3  . venlafaxine (EFFEXOR) 37.5 MG tablet 1 tablet by mouth daily x 1 week, then increase to 1 tablet twice daily.  60 tablet  2  . zolpidem (AMBIEN) 10 MG tablet Take 1 tablet (10 mg total) by mouth at bedtime as needed.  30 tablet  0   The PMH, PSH, Social History, Family History, Medications, and allergies have been reviewed in [facility], and have been updated if relevant.   Review of Systems See HPI  No fever    Objective:   Physical Exam BP 124/80  Pulse 72  Temp 98.1 F (36.7 C)  Wt 217 lb (98.431 kg) Gen:  Alert, pleasant, NAD Abd: Large vertical abdominal healed scar Small palpable mass left lower quadrant- very tender to palpation.  No rebound, no guarding Normal BS Rectal: Very small external hemorrhoid, non thrombosed Small possible anal fissure as well  Guaic positive       Assessment & Plan:   1. Right lower quadrant pain  Given h/o surgery and this is near one of the incision sites, does warrant immediate work up.  He is very tender on exam. ?suture now irritated with his frequent bowel movements but need CT immediately for further evaluation. CT Abdomen Pelvis W Contrast  2. Rectal bleeding  I suspect this is due to a small external hemorrhoid and small fissure. We will see more with CT scan.  Procotosol as needed.  Discussed other supportive measures for hemorrhoids. The patient indicates understanding of these  issues and agrees with the plan.  CT Abdomen Pelvis W Contrast

## 2012-02-19 ENCOUNTER — Ambulatory Visit (HOSPITAL_COMMUNITY)
Admission: RE | Admit: 2012-02-19 | Discharge: 2012-02-19 | Disposition: A | Discharge status: Home / Self Care | Payer: 59 | Source: Ambulatory Visit | Attending: Family Medicine | Admitting: Family Medicine

## 2012-02-19 ENCOUNTER — Encounter (HOSPITAL_COMMUNITY): Payer: Self-pay

## 2012-02-19 DIAGNOSIS — R1031 Right lower quadrant pain: Secondary | ICD-10-CM

## 2012-02-19 DIAGNOSIS — R1904 Left lower quadrant abdominal swelling, mass and lump: Secondary | ICD-10-CM | POA: Insufficient documentation

## 2012-02-19 DIAGNOSIS — K449 Diaphragmatic hernia without obstruction or gangrene: Secondary | ICD-10-CM | POA: Insufficient documentation

## 2012-02-19 DIAGNOSIS — K625 Hemorrhage of anus and rectum: Secondary | ICD-10-CM | POA: Insufficient documentation

## 2012-02-19 MED ORDER — IOHEXOL 300 MG/ML  SOLN
100.0000 mL | Freq: Once | INTRAMUSCULAR | Status: AC | PRN
  Administered 2012-02-19: 100 mL via INTRAVENOUS

## 2012-02-22 ENCOUNTER — Other Ambulatory Visit: Payer: Self-pay | Admitting: Family Medicine

## 2012-02-22 ENCOUNTER — Encounter: Payer: Self-pay | Admitting: Family Medicine

## 2012-02-22 DIAGNOSIS — K2289 Other specified disease of esophagus: Secondary | ICD-10-CM

## 2012-02-22 DIAGNOSIS — K228 Other specified diseases of esophagus: Secondary | ICD-10-CM

## 2012-02-22 DIAGNOSIS — K449 Diaphragmatic hernia without obstruction or gangrene: Secondary | ICD-10-CM

## 2012-02-23 ENCOUNTER — Encounter: Payer: Self-pay | Admitting: Internal Medicine

## 2012-02-23 ENCOUNTER — Encounter: Payer: Self-pay | Admitting: Family Medicine

## 2012-03-07 ENCOUNTER — Ambulatory Visit: Payer: 59 | Admitting: Internal Medicine

## 2012-03-19 ENCOUNTER — Other Ambulatory Visit: Payer: Self-pay

## 2012-03-21 ENCOUNTER — Other Ambulatory Visit: Payer: Self-pay | Admitting: Family Medicine

## 2012-03-21 MED ORDER — ZOLPIDEM TARTRATE 10 MG PO TABS: 10.0000 mg | ORAL_TABLET | Freq: Every evening | ORAL | Status: DC | PRN

## 2012-03-21 NOTE — Telephone Encounter (Signed)
MEDICATION REFILL 

## 2012-03-30 ENCOUNTER — Other Ambulatory Visit: Payer: Self-pay | Admitting: Gastroenterology

## 2012-03-30 ENCOUNTER — Encounter: Payer: Self-pay | Admitting: Family Medicine

## 2012-04-18 ENCOUNTER — Other Ambulatory Visit: Payer: Self-pay | Admitting: Family Medicine

## 2012-04-18 MED ORDER — ZOLPIDEM TARTRATE 10 MG PO TABS: 10.0000 mg | ORAL_TABLET | Freq: Every evening | ORAL | Status: DC | PRN

## 2012-04-18 NOTE — Telephone Encounter (Signed)
Medicine called to [facility] outpatient pharmacy.

## 2012-05-09 ENCOUNTER — Encounter: Payer: Self-pay | Admitting: Family Medicine

## 2012-05-09 ENCOUNTER — Ambulatory Visit (INDEPENDENT_AMBULATORY_CARE_PROVIDER_SITE_OTHER): Payer: 59 | Admitting: Family Medicine

## 2012-05-09 VITALS — BP 118/78 | HR 93 | Temp 98.2°F | Ht 69.0 in | Wt 226.0 lb

## 2012-05-09 DIAGNOSIS — J029 Acute pharyngitis, unspecified: Secondary | ICD-10-CM

## 2012-05-09 DIAGNOSIS — J020 Streptococcal pharyngitis: Secondary | ICD-10-CM

## 2012-05-09 MED ORDER — AMOXICILLIN 875 MG PO TABS: 875.0000 mg | ORAL_TABLET | Freq: Two times a day (BID) | ORAL | Status: DC

## 2012-05-09 NOTE — Progress Notes (Signed)
Nature conservation officer at [facility] [address] [place] [address] Phone: [phone] Fax: [phone]  Date:  05/09/2012   Name:  [name]   DOB:  04-Jan-1971   MRN:  [id number] Gender: male Age: 42 y.o.  Primary Physician:  Ruthe Mannan, MD  Evaluating MD: Hannah Beat, MD   Chief Complaint: Sore Throat   History of Present Illness:  [name] is a 42 y.o. pleasant patient who presents with the following:  The patient presents with a history of sore throat only this quite painful. Is having some difficulty with swallowing due to pain. No significant runny nose or cough.  Patient Active Problem List  Diagnosis  . Gastric ulcer  . DJD (degenerative joint disease)  . Ventral hernia  . H/O: upper GI bleed  . Anxiety    Past Medical History  Diagnosis Date  . Upper GI bleed   . Ventral hernia   . Hiatal hernia   . History of blood clots 2012    in abdomen  . Dizziness     occasionally  . Joint pain     knees  . GERD (gastroesophageal reflux disease)     takes Protonix bid  . Gastric ulcer     history of  . Constipation     related to pain meds  . Nocturia     depends on amount of fluid he drinks  . History of blood transfusion 2012  . Anxiety     takes Xanax daily prn  . External hemorrhoid, bleeding     Past Surgical History  Procedure Laterality Date  . Abdominal surgery  10/2010    d/t bleeding ulcers; "oversewing of gastric ulcer"  . Esophagogastroduodenoscopy    . Hernia repair  09/04/11    incisional   . Incisional hernia repair  09/04/2011    Procedure: HERNIA REPAIR INCISIONAL;  Surgeon: Lodema Pilot, DO;  Location: [facility] OR;  Service: General;  Laterality: N/A;  debridment calcified mass    History   Social History  . Marital Status: Single    Spouse Name: N/A    Number of Children: N/A  . Years of Education: N/A   Occupational History  . Not on file.   Social History Main Topics  . Smoking status: Never Smoker   .  Smokeless tobacco: Current User    Types: Chew  . Alcohol Use: Yes     Comment: 09/04/11 "very seldom"  . Drug Use: No  . Sexually Active: Yes   Other Topics Concern  . Not on file   Social History Narrative  . No narrative on file    Family History  Problem Relation Age of Onset  . Diabetes Mother   . Diabetes Father   . Heart disease Father     Allergies  Allergen Reactions  . Dilaudid (Hydromorphone Hcl) Rash and Other (See Comments)    Shakey  . Metoclopramide Hcl Hives, Swelling and Rash    Swelling in lips    Medication list has been reviewed and updated.  Outpatient Prescriptions Prior to Visit  Medication Sig Dispense Refill  . pantoprazole (PROTONIX) 40 MG tablet Take 1 tablet (40 mg total) by mouth 2 (two) times daily.  180 tablet  3  . venlafaxine (EFFEXOR) 37.5 MG tablet 1 tablet by mouth daily x 1 week, then increase to 1 tablet twice daily.  60 tablet  2  . zolpidem (AMBIEN) 10 MG tablet Take 1 tablet (10 mg  total) by mouth at bedtime as needed.  30 tablet  0  . ALPRAZolam (XANAX) 0.25 MG tablet Take 1 tablet (0.25 mg total) by mouth 3 (three) times daily as needed. For anxiety.  30 tablet  0  . hydrocortisone (ANUSOL-HC) 25 MG suppository Place 1 suppository (25 mg total) rectally 2 (two) times daily.  12 suppository  0   No facility-administered medications prior to visit.    Review of Systems:  ROS: GEN: Acute illness details above GI: Tolerating PO intake GU: maintaining adequate hydration and urination Pulm: No SOB Interactive and getting along well at home.  Otherwise, ROS is as per the HPI.   Physical Examination: BP 118/78  Pulse 93  Temp(Src) 98.2 F (36.8 C) (Oral)  Ht 5\' 9"  (1.753 m)  Wt 226 lb (102.513 kg)  BMI 33.36 kg/m2  SpO2 97%  Ideal Body Weight: Weight in (lb) to have BMI = 25: 168.9   Gen: WDWN, NAD; A & O x3, cooperative. Pleasant.Globally Non-toxic HEENT: Normocephalic and atraumatic. Throat: swollen tonsills without  exudate R TM clear, L TM - good landmarks, No fluid present. rhinnorhea. No frontal or maxillary sinus T. MMM NECK: Anterior cervical  LAD is present - TTP CV: RRR, No M/G/R, cap refill <2 sec PULM: Breathing comfortably in no respiratory distress. no wheezing, crackles, rhonchi ABD: S,NT,ND,+BS. No HSM. No rebound. EXT: No c/c/e PSYCH: Friendly, good eye contact   Assessment and Plan:  Streptococcal sore throat  Sore throat - Plan: POCT rapid strep A  Results for orders placed in visit on 05/09/12  POCT RAPID STREP A (OFFICE)      Result Value Range   Rapid Strep A Screen Positive (*) Negative     Orders Today:  Orders Placed This Encounter  Procedures  . POCT rapid strep A    Updated Medication List: (Includes new medications, updates to list, dose adjustments) Meds ordered this encounter  Medications  . amoxicillin (AMOXIL) 875 MG tablet    Sig: Take 1 tablet (875 mg total) by mouth 2 (two) times daily.    Dispense:  20 tablet    Refill:  0    Medications Discontinued: Medications Discontinued During This Encounter  Medication Reason  . ALPRAZolam (XANAX) 0.25 MG tablet Error  . hydrocortisone (ANUSOL-HC) 25 MG suppository Error      Signed, Kwane Rohl T. Dorothyann Mourer, MD 05/09/2012 3:45 PM

## 2012-05-25 ENCOUNTER — Encounter: Payer: Self-pay | Admitting: Family Medicine

## 2012-05-25 ENCOUNTER — Other Ambulatory Visit: Payer: Self-pay | Admitting: Family Medicine

## 2012-05-25 MED ORDER — VENLAFAXINE HCL 37.5 MG PO TABS: ORAL_TABLET | ORAL | Status: DC

## 2012-05-25 MED ORDER — ZOLPIDEM TARTRATE 10 MG PO TABS: 10.0000 mg | ORAL_TABLET | Freq: Every evening | ORAL | Status: DC | PRN

## 2012-05-25 NOTE — Telephone Encounter (Signed)
Medicine called to pharmacy. 

## 2012-06-24 ENCOUNTER — Encounter: Payer: Self-pay | Admitting: Family Medicine

## 2012-06-24 ENCOUNTER — Other Ambulatory Visit: Payer: Self-pay | Admitting: Family Medicine

## 2012-06-24 MED ORDER — ZOLPIDEM TARTRATE 10 MG PO TABS: 10.0000 mg | ORAL_TABLET | Freq: Every evening | ORAL | Status: DC | PRN

## 2012-06-24 MED ORDER — VENLAFAXINE HCL 37.5 MG PO TABS: ORAL_TABLET | ORAL | Status: DC

## 2012-06-24 NOTE — Telephone Encounter (Signed)
Medicine called to pharmacy. 

## 2012-06-24 NOTE — Telephone Encounter (Signed)
OK to refill?  Dr. Dayton Martes is out of the office today.

## 2012-06-24 NOTE — Telephone Encounter (Signed)
plz phone in. 

## 2012-07-05 ENCOUNTER — Encounter: Payer: Self-pay | Admitting: Family Medicine

## 2012-07-06 ENCOUNTER — Other Ambulatory Visit: Payer: Self-pay | Admitting: Family Medicine

## 2012-07-08 ENCOUNTER — Encounter: Payer: Self-pay | Admitting: Radiology

## 2012-07-11 ENCOUNTER — Encounter: Payer: Self-pay | Admitting: Family Medicine

## 2012-07-11 ENCOUNTER — Ambulatory Visit (INDEPENDENT_AMBULATORY_CARE_PROVIDER_SITE_OTHER)
Admission: RE | Admit: 2012-07-11 | Discharge: 2012-07-11 | Disposition: A | Discharge status: Home / Self Care | Payer: 59 | Source: Ambulatory Visit | Attending: Family Medicine | Admitting: Family Medicine

## 2012-07-11 ENCOUNTER — Ambulatory Visit (INDEPENDENT_AMBULATORY_CARE_PROVIDER_SITE_OTHER): Payer: 59 | Admitting: Family Medicine

## 2012-07-11 VITALS — BP 130/84 | HR 88 | Temp 98.2°F | Wt 227.0 lb

## 2012-07-11 DIAGNOSIS — R079 Chest pain, unspecified: Secondary | ICD-10-CM

## 2012-07-11 DIAGNOSIS — F419 Anxiety disorder, unspecified: Secondary | ICD-10-CM

## 2012-07-11 DIAGNOSIS — R0789 Other chest pain: Secondary | ICD-10-CM | POA: Insufficient documentation

## 2012-07-11 DIAGNOSIS — F411 Generalized anxiety disorder: Secondary | ICD-10-CM

## 2012-07-11 DIAGNOSIS — R229 Localized swelling, mass and lump, unspecified: Secondary | ICD-10-CM

## 2012-07-11 DIAGNOSIS — R2242 Localized swelling, mass and lump, left lower limb: Secondary | ICD-10-CM

## 2012-07-11 MED ORDER — BUSPIRONE HCL 15 MG PO TABS: 7.5000 mg | ORAL_TABLET | Freq: Three times a day (TID) | ORAL | Status: DC

## 2012-07-11 NOTE — Progress Notes (Signed)
Subjective:    Patient ID: [name], male    DOB: Mar 05, 1970, 42 y.o.   MRN: [id number]  HPI  42 yo with h/o anxiety and large ventral hernia repair in 09/2011 here for:  1.  Mass on left thigh- growing and seems to be more painful to touch. H/o lipomas that have required removal in past.  2.  "Rib pain"- last several weeks he is very tender to area close to his surgical scar on his chest when he lays on his chest/stomach. Pain can be sharp.  Not worsened by exertion or deep inspiration. He states he is not sure if he has had any injuries since he plays softball regularly.  3. Anxiety- has been well controlled on effexor but he is having erectile dysfunction and decreased sex drive.  He would like to try something else- preferably xanax again. In past Zoloft made him feel more anxiety. Denies feeling depressed.   Patient Active Problem List   Diagnosis Date Noted  . Mass of left thigh 07/11/2012  . Sternal pain 07/11/2012  . Ventral hernia 05/26/2011  . H/O: upper GI bleed 05/26/2011  . Anxiety 05/26/2011  . DJD (degenerative joint disease) 10/14/2010  . Gastric ulcer 09/29/2010   Past Medical History  Diagnosis Date  . Upper GI bleed   . Ventral hernia   . Hiatal hernia   . History of blood clots 2012    in abdomen  . Dizziness     occasionally  . Joint pain     knees  . GERD (gastroesophageal reflux disease)     takes Protonix bid  . Gastric ulcer     history of  . Constipation     related to pain meds  . Nocturia     depends on amount of fluid he drinks  . History of blood transfusion 2012  . Anxiety     takes Xanax daily prn  . External hemorrhoid, bleeding    Past Surgical History  Procedure Laterality Date  . Abdominal surgery  10/2010    d/t bleeding ulcers; "oversewing of gastric ulcer"  . Esophagogastroduodenoscopy    . Hernia repair  09/04/11    incisional   . Incisional hernia repair  09/04/2011    Procedure: HERNIA REPAIR INCISIONAL;  Surgeon:  Lodema Pilot, DO;  Location: [facility] OR;  Service: General;  Laterality: N/A;  debridment calcified mass   History  Substance Use Topics  . Smoking status: Never Smoker   . Smokeless tobacco: Current User    Types: Chew  . Alcohol Use: Yes     Comment: 09/04/11 "very seldom"   Family History  Problem Relation Age of Onset  . Diabetes Mother   . Diabetes Father   . Heart disease Father    Allergies  Allergen Reactions  . Dilaudid (Hydromorphone Hcl) Rash and Other (See Comments)    Shakey  . Metoclopramide Hcl Hives, Swelling and Rash    Swelling in lips   Current Outpatient Prescriptions on File Prior to Visit  Medication Sig Dispense Refill  . pantoprazole (PROTONIX) 40 MG tablet TAKE 1 TABLET BY MOUTH TWICE DAILY  180 tablet  3  . venlafaxine (EFFEXOR) 37.5 MG tablet Take one tablet by mouth twice a day  60 tablet  2  . zolpidem (AMBIEN) 10 MG tablet Take 1 tablet (10 mg total) by mouth at bedtime as needed.  30 tablet  0   No current facility-administered medications on file prior to visit.  The PMH, PSH, Social History, Family History, Medications, and allergies have been reviewed in [facility], and have been updated if relevant.   Review of Systems See HPI No fever    Objective:   Physical Exam BP 130/84  Pulse 88  Temp(Src) 98.2 F (36.8 C)  Wt 227 lb (102.967 kg)  BMI 33.51 kg/m2 Gen:  Alert, pleasant, NAD Chest: Large vertical abdominal healed scar that extends to sternum, TTP just lateral (right) of scar. No increased pain with deep inspirations Skin:  Small palpable, freely movable mass left thigh, mildly TTP, no erythema or warmth.    Assessment & Plan:  1. Mass of left thigh ?lipoma vs cyst. Will refer to surgery for removal. - Ambulatory referral to General Surgery  2. Sternal pain Given h/o surgery, will check CXR. - DG Chest 2 View; Future  3. Anxiety Stable now with sexual dysfunction.  Will wean off effexor.  Explained that xanax is not a good  chronic, long term, daily medication.  Will start Buspar. Follow up in 3 weeks. The patient indicates understanding of these issues and agrees with the plan.

## 2012-07-11 NOTE — Patient Instructions (Addendum)
Good to see you. In order to wean off effexor- take 1 tablet every other day for 1 week, then 1/2 tablet every other day for 1 week and stop. Ok start Buspar right away.  We will call you with your xray results.  Please stop by to see Shirlee Limerick on your way out to set up your referral to surgery.

## 2012-07-12 ENCOUNTER — Encounter: Payer: Self-pay | Admitting: Family Medicine

## 2012-07-25 ENCOUNTER — Other Ambulatory Visit: Payer: Self-pay | Admitting: Family Medicine

## 2012-07-25 NOTE — Telephone Encounter (Signed)
Refill called to [facility].

## 2012-07-28 ENCOUNTER — Encounter (INDEPENDENT_AMBULATORY_CARE_PROVIDER_SITE_OTHER): Payer: Commercial Managed Care - PPO | Admitting: General Surgery

## 2012-08-08 ENCOUNTER — Encounter: Payer: Self-pay | Admitting: Family Medicine

## 2012-08-08 ENCOUNTER — Ambulatory Visit (INDEPENDENT_AMBULATORY_CARE_PROVIDER_SITE_OTHER): Payer: 59 | Admitting: Family Medicine

## 2012-08-08 VITALS — BP 110/100 | HR 64 | Temp 98.2°F | Wt 226.0 lb

## 2012-08-08 DIAGNOSIS — Z23 Encounter for immunization: Secondary | ICD-10-CM

## 2012-08-08 DIAGNOSIS — N529 Male erectile dysfunction, unspecified: Secondary | ICD-10-CM

## 2012-08-08 DIAGNOSIS — F419 Anxiety disorder, unspecified: Secondary | ICD-10-CM

## 2012-08-08 DIAGNOSIS — F411 Generalized anxiety disorder: Secondary | ICD-10-CM

## 2012-08-08 MED ORDER — SILDENAFIL CITRATE 100 MG PO TABS: 50.0000 mg | ORAL_TABLET | Freq: Every day | ORAL | Status: DC | PRN

## 2012-08-08 NOTE — Patient Instructions (Addendum)
Good to see you. Please come back one morning to check your testosterone.

## 2012-08-08 NOTE — Progress Notes (Signed)
Subjective:     Patient ID: [name], male   DOB: [birth year], 42 y.o.   MRN: [id number]  HPI Very pleasant 42 year old male here to discuss anxiety.  He has had panic attacks since moving to this area- takes prn xanax occasionally. Started Zoloft and Ambien in October 2013.  Felt Zoloft made him more anxious.  Had been well controlled on effexor but he was having erectile dysfunction and decreased sex drive. He weaned off Effexor and started Buspar 7.5 mg tid last month.  He likes Buspar a lot.  Feels anxiety is under excellent control.  He has a good sex drive- he's in a great relationship but cannot maintain and erection.  Improved with buspar but still having some issues.   Patient Active Problem List   Diagnosis Date Noted  . Mass of left thigh 07/11/2012  . Sternal pain 07/11/2012  . Ventral hernia 05/26/2011  . H/O: upper GI bleed 05/26/2011  . Anxiety 05/26/2011  . DJD (degenerative joint disease) 10/14/2010  . Gastric ulcer 09/29/2010   Past Medical History  Diagnosis Date  . Upper GI bleed   . Ventral hernia   . Hiatal hernia   . History of blood clots 2012    in abdomen  . Dizziness     occasionally  . Joint pain     knees  . GERD (gastroesophageal reflux disease)     takes Protonix bid  . Gastric ulcer     history of  . Constipation     related to pain meds  . Nocturia     depends on amount of fluid he drinks  . History of blood transfusion 2012  . Anxiety     takes Xanax daily prn  . External hemorrhoid, bleeding    Past Surgical History  Procedure Laterality Date  . Abdominal surgery  10/2010    d/t bleeding ulcers; "oversewing of gastric ulcer"  . Esophagogastroduodenoscopy    . Hernia repair  09/04/11    incisional   . Incisional hernia repair  09/04/2011    Procedure: HERNIA REPAIR INCISIONAL;  Surgeon: Lodema Pilot, DO;  Location: [facility] OR;  Service: General;  Laterality: N/A;  debridment calcified mass   History  Substance Use Topics  .  Smoking status: Never Smoker   . Smokeless tobacco: Current User    Types: Chew  . Alcohol Use: Yes     Comment: 09/04/11 "very seldom"   Family History  Problem Relation Age of Onset  . Diabetes Mother   . Diabetes Father   . Heart disease Father    Allergies  Allergen Reactions  . Dilaudid (Hydromorphone Hcl) Rash and Other (See Comments)    Shakey  . Metoclopramide Hcl Hives, Swelling and Rash    Swelling in lips   Current Outpatient Prescriptions on File Prior to Visit  Medication Sig Dispense Refill  . busPIRone (BUSPAR) 15 MG tablet Take 0.5 tablets (7.5 mg total) by mouth 3 (three) times daily.  90 tablet  3  . pantoprazole (PROTONIX) 40 MG tablet TAKE 1 TABLET BY MOUTH TWICE DAILY  180 tablet  3  . venlafaxine (EFFEXOR) 37.5 MG tablet Take one tablet by mouth twice a day  60 tablet  2  . zolpidem (AMBIEN) 10 MG tablet TAKE 1 TABLET BY MOUTH AT BEDTIME AS NEEDED  30 tablet  0   No current facility-administered medications on file prior to visit.   The PMH, PSH, Social History, Family History, Medications, and allergies have  been reviewed in [facility], and have been updated if relevant.  ROS: See HPI     Objective:   Physical Exam BP 110/100  Pulse 64  Temp(Src) 98.2 F (36.8 C)  Wt 226 lb (102.513 kg)  BMI 33.36 kg/m2 General:  Pleasant male in NAD Psych:  Good eye contact, mildly anxious appearing, pleasant and conversant      Assessment/Plan:   1. Anxiety Improved.  Continue current dose of buspar.  2. Erectile dysfunction Check AM testosterone to rule out low testosterone as an issue. Rx for viagra sent to pharmacy.  Discussed possible side effects.  He states he has tried this before without issue. - Testosterone,Free and Total; Future - CBC with Differential; Future - PSA; Future

## 2012-08-08 NOTE — Addendum Note (Signed)
Addended by: Eliezer Bottom on: 08/08/2012 04:12 PM   Modules accepted: Orders

## 2012-08-12 ENCOUNTER — Encounter (INDEPENDENT_AMBULATORY_CARE_PROVIDER_SITE_OTHER): Payer: Self-pay | Admitting: General Surgery

## 2012-08-24 ENCOUNTER — Other Ambulatory Visit: Payer: Self-pay | Admitting: Family Medicine

## 2012-08-24 MED ORDER — ZOLPIDEM TARTRATE 10 MG PO TABS: ORAL_TABLET | ORAL | Status: DC

## 2012-08-24 NOTE — Telephone Encounter (Signed)
Refill called to pharmacy.

## 2012-08-25 ENCOUNTER — Encounter: Payer: Self-pay | Admitting: Family Medicine

## 2012-09-05 ENCOUNTER — Encounter: Payer: Self-pay | Admitting: Radiology

## 2012-09-06 ENCOUNTER — Ambulatory Visit (INDEPENDENT_AMBULATORY_CARE_PROVIDER_SITE_OTHER): Payer: 59 | Admitting: Family Medicine

## 2012-09-06 ENCOUNTER — Encounter: Payer: Self-pay | Admitting: Family Medicine

## 2012-09-06 VITALS — BP 132/102 | HR 88 | Temp 98.8°F | Ht 69.0 in | Wt 224.0 lb

## 2012-09-06 DIAGNOSIS — H60399 Other infective otitis externa, unspecified ear: Secondary | ICD-10-CM

## 2012-09-06 DIAGNOSIS — R5383 Other fatigue: Secondary | ICD-10-CM

## 2012-09-06 DIAGNOSIS — H60392 Other infective otitis externa, left ear: Secondary | ICD-10-CM

## 2012-09-06 DIAGNOSIS — N529 Male erectile dysfunction, unspecified: Secondary | ICD-10-CM

## 2012-09-06 DIAGNOSIS — R5381 Other malaise: Secondary | ICD-10-CM

## 2012-09-06 MED ORDER — SILDENAFIL CITRATE 100 MG PO TABS: 50.0000 mg | ORAL_TABLET | Freq: Every day | ORAL | Status: DC | PRN

## 2012-09-06 MED ORDER — CIPROFLOXACIN-DEXAMETHASONE 0.3-0.1 % OT SUSP: 4.0000 [drp] | Freq: Two times a day (BID) | OTIC | Status: DC

## 2012-09-06 NOTE — Patient Instructions (Addendum)
Good to see you. Please use ear drop as directed.  Call us later in the week if no improvement.

## 2012-09-06 NOTE — Progress Notes (Signed)
(  S) 42 y.o. male complains of pain in left ear for 5 days. No fever or URI symptoms.  Ear is very painful to touch.  Patient Active Problem List   Diagnosis Date Noted  . Erectile dysfunction 08/08/2012  . Mass of left thigh 07/11/2012  . Sternal pain 07/11/2012  . Ventral hernia 05/26/2011  . H/O: upper GI bleed 05/26/2011  . Anxiety 05/26/2011  . DJD (degenerative joint disease) 10/14/2010  . Gastric ulcer 09/29/2010   Past Medical History  Diagnosis Date  . Upper GI bleed   . Ventral hernia   . Hiatal hernia   . History of blood clots 2012    in abdomen  . Dizziness     occasionally  . Joint pain     knees  . GERD (gastroesophageal reflux disease)     takes Protonix bid  . Gastric ulcer     history of  . Constipation     related to pain meds  . Nocturia     depends on amount of fluid he drinks  . History of blood transfusion 2012  . Anxiety     takes Xanax daily prn  . External hemorrhoid, bleeding    Past Surgical History  Procedure Laterality Date  . Abdominal surgery  10/2010    d/t bleeding ulcers; "oversewing of gastric ulcer"  . Esophagogastroduodenoscopy    . Hernia repair  09/04/11    incisional   . Incisional hernia repair  09/04/2011    Procedure: HERNIA REPAIR INCISIONAL;  Surgeon: Lodema Pilot, DO;  Location: [facility] OR;  Service: General;  Laterality: N/A;  debridment calcified mass   History  Substance Use Topics  . Smoking status: Never Smoker   . Smokeless tobacco: Current User    Types: Chew  . Alcohol Use: Yes     Comment: 09/04/11 "very seldom"   Family History  Problem Relation Age of Onset  . Diabetes Mother   . Diabetes Father   . Heart disease Father    Allergies  Allergen Reactions  . Dilaudid (Hydromorphone Hcl) Rash and Other (See Comments)    Shakey  . Metoclopramide Hcl Hives, Swelling and Rash    Swelling in lips   Current Outpatient Prescriptions on File Prior to Visit  Medication Sig Dispense Refill  . busPIRone (BUSPAR) 15  MG tablet Take 0.5 tablets (7.5 mg total) by mouth 3 (three) times daily.  90 tablet  3  . pantoprazole (PROTONIX) 40 MG tablet TAKE 1 TABLET BY MOUTH TWICE DAILY  180 tablet  3  . zolpidem (AMBIEN) 10 MG tablet TAKE 1 TABLET BY MOUTH AT BEDTIME AS NEEDED  30 tablet  0   No current facility-administered medications on file prior to visit.   The PMH, PSH, Social History, Family History, Medications, and allergies have been reviewed in [facility], and have been updated if relevant.   (O) BP 132/102  Pulse 88  Temp(Src) 98.8 F (37.1 C)  Ht 5\' 9"  (1.753 m)  Wt 224 lb (101.606 kg)  BMI 33.06 kg/m2   He appears well, afebrile. Left ear reveals tenderness of the tragus; debris and inflammation in external canal. TM is not well seen due to debris, but visualized aspects appear normal.  (A) Otitis Externa  (P) Instructed to keep ear dry until better; eardrops per orders, call if persistent pain, swelling or fever, FUV prn.

## 2012-09-07 LAB — TESTOSTERONE, FREE, TOTAL, SHBG
Sex Hormone Binding: 18 nmol/L (ref 13–71)
Testosterone: 352 ng/dL (ref 300–890)

## 2012-09-09 ENCOUNTER — Encounter: Payer: Self-pay | Admitting: *Deleted

## 2012-09-19 ENCOUNTER — Ambulatory Visit (INDEPENDENT_AMBULATORY_CARE_PROVIDER_SITE_OTHER): Payer: 59 | Admitting: Family Medicine

## 2012-09-19 ENCOUNTER — Encounter: Payer: Self-pay | Admitting: Family Medicine

## 2012-09-19 VITALS — BP 146/82 | HR 106 | Temp 99.1°F | Ht 69.0 in | Wt 226.0 lb

## 2012-09-19 DIAGNOSIS — K0889 Other specified disorders of teeth and supporting structures: Secondary | ICD-10-CM

## 2012-09-19 DIAGNOSIS — K089 Disorder of teeth and supporting structures, unspecified: Secondary | ICD-10-CM

## 2012-09-19 MED ORDER — AMOXICILLIN-POT CLAVULANATE 875-125 MG PO TABS: 1.0000 | ORAL_TABLET | Freq: Two times a day (BID) | ORAL | Status: DC

## 2012-09-19 MED ORDER — HYDROCODONE-ACETAMINOPHEN 5-325 MG PO TABS: 1.0000 | ORAL_TABLET | ORAL | Status: DC | PRN

## 2012-09-19 NOTE — Patient Instructions (Addendum)
When you get home - call dentist to get an appt asap You can use a warm compress on jaw  Take the augmentin as directed  norco as needed with caution because it will sedate you

## 2012-09-19 NOTE — Progress Notes (Signed)
Subjective:    Patient ID: [name], male    DOB: 1970/10/22, 42 y.o.   MRN: [id number]  HPI Here with jaw pain  ? If a tooth infection- bit down on a pc of popcorn and thinks he may have chipped a tooth Hurts Feels swollen Has not had a fever   He has had a tooth infection in the past- but not this tooth   He tried some tylenol -not very helpful  Cannot take any nsaids - due to prior hx of GI bleed   Does not have a regular dentist - last visit was 1-2 y ago   Patient Active Problem List   Diagnosis Date Noted  . Erectile dysfunction 08/08/2012  . Mass of left thigh 07/11/2012  . Sternal pain 07/11/2012  . Ventral hernia 05/26/2011  . H/O: upper GI bleed 05/26/2011  . Anxiety 05/26/2011  . DJD (degenerative joint disease) 10/14/2010  . Gastric ulcer 09/29/2010   Past Medical History  Diagnosis Date  . Upper GI bleed   . Ventral hernia   . Hiatal hernia   . History of blood clots 2012    in abdomen  . Dizziness     occasionally  . Joint pain     knees  . GERD (gastroesophageal reflux disease)     takes Protonix bid  . Gastric ulcer     history of  . Constipation     related to pain meds  . Nocturia     depends on amount of fluid he drinks  . History of blood transfusion 2012  . Anxiety     takes Xanax daily prn  . External hemorrhoid, bleeding    Past Surgical History  Procedure Laterality Date  . Abdominal surgery  10/2010    d/t bleeding ulcers; "oversewing of gastric ulcer"  . Esophagogastroduodenoscopy    . Hernia repair  09/04/11    incisional   . Incisional hernia repair  09/04/2011    Procedure: HERNIA REPAIR INCISIONAL;  Surgeon: Lodema Pilot, DO;  Location: [facility] OR;  Service: General;  Laterality: N/A;  debridment calcified mass   History  Substance Use Topics  . Smoking status: Never Smoker   . Smokeless tobacco: Current User    Types: Chew     Comment: occ-Chew  . Alcohol Use: Yes     Comment: 09/04/11 "very seldom"   Family History   Problem Relation Age of Onset  . Diabetes Mother   . Diabetes Father   . Heart disease Father    Allergies  Allergen Reactions  . Dilaudid [Hydromorphone Hcl] Rash and Other (See Comments)    Shakey  . Metoclopramide Hcl Hives, Swelling and Rash    Swelling in lips   Current Outpatient Prescriptions on File Prior to Visit  Medication Sig Dispense Refill  . busPIRone (BUSPAR) 15 MG tablet Take 0.5 tablets (7.5 mg total) by mouth 3 (three) times daily.  90 tablet  3  . pantoprazole (PROTONIX) 40 MG tablet TAKE 1 TABLET BY MOUTH TWICE DAILY  180 tablet  3  . sildenafil (VIAGRA) 100 MG tablet Take 0.5-1 tablets (50-100 mg total) by mouth daily as needed for erectile dysfunction.  15 tablet  11  . zolpidem (AMBIEN) 10 MG tablet TAKE 1 TABLET BY MOUTH AT BEDTIME AS NEEDED  30 tablet  0   No current facility-administered medications on file prior to visit.      Review of Systems Review of Systems  Constitutional: Negative for fever,  appetite change, fatigue and unexpected weight change.  Eyes: Negative for pain and visual disturbance.  ENT pos for mouth/ tooth pain and swelling  Respiratory: Negative for cough and shortness of breath.   Cardiovascular: Negative for cp or palpitations    Gastrointestinal: Negative for nausea, diarrhea and constipation.  Genitourinary: Negative for urgency and frequency.  Skin: Negative for pallor or rash   Neurological: Negative for weakness, light-headedness, numbness and headaches.  Hematological: Negative for adenopathy. Does not bruise/bleed easily.  Psychiatric/Behavioral: Negative for dysphoric mood. The patient is not nervous/anxious.         Objective:   Physical Exam  Constitutional: He appears well-developed and well-nourished. No distress.  HENT:  Head: Normocephalic and atraumatic.  Right Ear: External ear normal.  Left Ear: External ear normal.  Nose: Nose normal.  Swelling of R posterior mandible/soft tissue Most posterior  lower molar is broken with intact filling and tenderness in gumline on either side Dentition is fair overall   No sinus tenderness No parotid swelling   Eyes: Conjunctivae and EOM are normal. Pupils are equal, round, and reactive to light. Right eye exhibits no discharge. Left eye exhibits no discharge. No scleral icterus.  Neck: Normal range of motion. Neck supple. No tracheal deviation present. No thyromegaly present.  Cardiovascular: Regular rhythm.   Pulmonary/Chest: Effort normal.  Lymphadenopathy:    He has cervical adenopathy.  Neurological: He is alert. No cranial nerve deficit.  Skin: Skin is warm and dry. No rash noted. No erythema.  Psychiatric: He has a normal mood and affect.          Assessment & Plan:

## 2012-09-20 NOTE — Assessment & Plan Note (Signed)
With broken molar/ likely infection but no obvious abcess Cover with augmentin  Urged to get dentist appt asap Warm compresses to jaw norco for pain with caution (pt cannot take nsaid due to hx of PUD)

## 2012-09-23 ENCOUNTER — Other Ambulatory Visit: Payer: Self-pay | Admitting: Family Medicine

## 2012-09-23 ENCOUNTER — Encounter: Payer: Self-pay | Admitting: Family Medicine

## 2012-09-23 MED ORDER — ZOLPIDEM TARTRATE 10 MG PO TABS: ORAL_TABLET | ORAL | Status: DC

## 2012-09-23 NOTE — Telephone Encounter (Signed)
Okay to refill? Last refilled on 08/24/12

## 2012-09-23 NOTE — Telephone Encounter (Signed)
Refill called to pharmacy.

## 2012-10-12 ENCOUNTER — Encounter: Payer: Self-pay | Admitting: Radiology

## 2012-10-13 ENCOUNTER — Ambulatory Visit (INDEPENDENT_AMBULATORY_CARE_PROVIDER_SITE_OTHER): Payer: 59 | Admitting: Family Medicine

## 2012-10-13 ENCOUNTER — Encounter: Payer: Self-pay | Admitting: Family Medicine

## 2012-10-13 VITALS — BP 118/90 | HR 106 | Temp 98.9°F | Ht 69.0 in | Wt 221.0 lb

## 2012-10-13 DIAGNOSIS — F411 Generalized anxiety disorder: Secondary | ICD-10-CM

## 2012-10-13 DIAGNOSIS — F419 Anxiety disorder, unspecified: Secondary | ICD-10-CM

## 2012-10-13 DIAGNOSIS — R259 Unspecified abnormal involuntary movements: Secondary | ICD-10-CM

## 2012-10-13 DIAGNOSIS — R251 Tremor, unspecified: Secondary | ICD-10-CM | POA: Insufficient documentation

## 2012-10-13 DIAGNOSIS — Z23 Encounter for immunization: Secondary | ICD-10-CM

## 2012-10-13 LAB — BASIC METABOLIC PANEL
BUN: 9 mg/dL (ref 6–23)
CO2: 28 mEq/L (ref 19–32)
Calcium: 9.4 mg/dL (ref 8.4–10.5)
Chloride: 102 mEq/L (ref 96–112)
Creatinine, Ser: 1.1 mg/dL (ref 0.4–1.5)
Glucose, Bld: 104 mg/dL — ABNORMAL HIGH (ref 70–99)

## 2012-10-13 LAB — HEMOGLOBIN A1C: Hgb A1c MFr Bld: 6.2 % (ref 4.6–6.5)

## 2012-10-13 MED ORDER — BUSPIRONE HCL 15 MG PO TABS: 15.0000 mg | ORAL_TABLET | Freq: Three times a day (TID) | ORAL | Status: DC

## 2012-10-13 NOTE — Progress Notes (Signed)
Subjective:    Patient ID: [name], male    DOB: 1970/11/03, 42 y.o.   MRN: [id number]  HPI  42 yo male here for:  Shakiness/dizziness. Past 2-3 weeks, notices first thing in the morning he feels very shaky.  This is usually improved with eating and gets better throughout the day. Has noted some increased thirst, urination and fatigue. Does have FH of DM.  Appetite has been decreased as well.  Some mild diarrhea. Wt Readings from Last 3 Encounters:  10/13/12 221 lb (100.245 kg)  09/19/12 226 lb (102.513 kg)  09/06/12 224 lb (101.606 kg)    Anxiety- felt buspar was working well until last few weeks.  Feel more anxious.  Patient Active Problem List   Diagnosis Date Noted  . Shakiness 10/13/2012  . Tooth pain 09/19/2012  . Erectile dysfunction 08/08/2012  . Mass of left thigh 07/11/2012  . Sternal pain 07/11/2012  . Ventral hernia 05/26/2011  . H/O: upper GI bleed 05/26/2011  . Anxiety 05/26/2011  . DJD (degenerative joint disease) 10/14/2010  . Gastric ulcer 09/29/2010   Past Medical History  Diagnosis Date  . Upper GI bleed   . Ventral hernia   . Hiatal hernia   . History of blood clots 2012    in abdomen  . Dizziness     occasionally  . Joint pain     knees  . GERD (gastroesophageal reflux disease)     takes Protonix bid  . Gastric ulcer     history of  . Constipation     related to pain meds  . Nocturia     depends on amount of fluid he drinks  . History of blood transfusion 2012  . Anxiety     takes Xanax daily prn  . External hemorrhoid, bleeding    Past Surgical History  Procedure Laterality Date  . Abdominal surgery  10/2010    d/t bleeding ulcers; "oversewing of gastric ulcer"  . Esophagogastroduodenoscopy    . Hernia repair  09/04/11    incisional   . Incisional hernia repair  09/04/2011    Procedure: HERNIA REPAIR INCISIONAL;  Surgeon: Lodema Pilot, DO;  Location: [facility];  Service: General;  Laterality: N/A;  debridment calcified mass    History  Substance Use Topics  . Smoking status: Never Smoker   . Smokeless tobacco: Current User    Types: Chew     Comment: occ-Chew  . Alcohol Use: Yes     Comment: 09/04/11 "very seldom"   Family History  Problem Relation Age of Onset  . Diabetes Mother   . Diabetes Father   . Heart disease Father    Allergies  Allergen Reactions  . Dilaudid [Hydromorphone Hcl] Rash and Other (See Comments)    Shakey  . Metoclopramide Hcl Hives, Swelling and Rash    Swelling in lips   Current Outpatient Prescriptions on File Prior to Visit  Medication Sig Dispense Refill  . pantoprazole (PROTONIX) 40 MG tablet TAKE 1 TABLET BY MOUTH TWICE DAILY  180 tablet  3  . sildenafil (VIAGRA) 100 MG tablet Take 0.5-1 tablets (50-100 mg total) by mouth daily as needed for erectile dysfunction.  15 tablet  11  . zolpidem (AMBIEN) 10 MG tablet TAKE 1 TABLET BY MOUTH AT BEDTIME AS NEEDED  30 tablet  0   No current facility-administered medications on file prior to visit.   The PMH, PSH, Social History, Family History, Medications, and allergies have been reviewed in [facility], and  have been updated if relevant.  Review of Systems See HPI    Objective:   Physical Exam BP 118/90  Pulse 106  Temp(Src) 98.9 F (37.2 C)  Ht 5\' 9"  (1.753 m)  Wt 221 lb (100.245 kg)  BMI 32.62 kg/m2 Wt Readings from Last 3 Encounters:  10/13/12 221 lb (100.245 kg)  09/19/12 226 lb (102.513 kg)  09/06/12 224 lb (101.606 kg)   General:  Pleasant male in NAD Eyes:  PERRL Ears:  External ear exam shows no significant lesions or deformities.  Otoscopic examination reveals clear canals, tympanic membranes are intact bilaterally without bulging, retraction, inflammation or discharge. Hearing is grossly normal bilaterally. Nose:  External nasal examination shows no deformity or inflammation. Nasal mucosa are pink and moist without lesions or exudates. Mouth:  Oral mucosa and oropharynx without lesions or exudates.  Teeth in  good repair. Neck:  no carotid bruit or thyromegaly no cervical or supraclavicular lymphadenopathy  Lungs:  Normal respiratory effort, chest expands symmetrically. Lungs are clear to auscultation, no crackles or wheezes. Heart:  Normal rate and regular rhythm. S1 and S2 normal without gallop, murmur, click, rub or other extra sounds. Pulses:  R and L posterior tibial pulses are full and equal bilaterally  Extremities:  no edema     Assessment & Plan:  1. Anxiety Deteriorated. Increase buspar to 15 mg three times daily. May be cause of shakiness.  2. Shakiness See above.  Will check labs to rule out DM, thyroid dysfunction, etc. The patient indicates understanding of these issues and agrees with the plan.  - Hemoglobin A1c - Basic Metabolic Panel - TSH

## 2012-10-13 NOTE — Addendum Note (Signed)
Addended by: Eliezer Bottom on: 10/13/2012 11:36 AM   Modules accepted: Orders

## 2012-10-13 NOTE — Patient Instructions (Signed)
Good to see you. We are increasing your Buspar to 15 mg three times daily.   We will call you with your lab results.

## 2012-10-14 MED ORDER — METFORMIN HCL 500 MG PO TABS: ORAL_TABLET | ORAL | Status: DC

## 2012-10-14 NOTE — Addendum Note (Signed)
Addended by: Eliezer Bottom on: 10/14/2012 04:28 PM   Modules accepted: Orders

## 2012-10-21 ENCOUNTER — Encounter: Payer: Self-pay | Admitting: Family Medicine

## 2012-10-21 MED ORDER — ZOLPIDEM TARTRATE 10 MG PO TABS: ORAL_TABLET | ORAL | Status: DC

## 2012-10-21 NOTE — Telephone Encounter (Signed)
Refill called to [facility].

## 2012-11-16 ENCOUNTER — Encounter: Payer: Self-pay | Admitting: Radiology

## 2012-11-17 ENCOUNTER — Other Ambulatory Visit: Payer: Self-pay | Admitting: *Deleted

## 2012-11-17 ENCOUNTER — Ambulatory Visit (INDEPENDENT_AMBULATORY_CARE_PROVIDER_SITE_OTHER): Payer: 59 | Admitting: Family Medicine

## 2012-11-17 ENCOUNTER — Encounter: Payer: Self-pay | Admitting: Family Medicine

## 2012-11-17 VITALS — BP 144/96 | HR 85 | Temp 98.1°F | Wt 225.0 lb

## 2012-11-17 DIAGNOSIS — F419 Anxiety disorder, unspecified: Secondary | ICD-10-CM

## 2012-11-17 DIAGNOSIS — R7309 Other abnormal glucose: Secondary | ICD-10-CM | POA: Insufficient documentation

## 2012-11-17 DIAGNOSIS — F411 Generalized anxiety disorder: Secondary | ICD-10-CM

## 2012-11-17 DIAGNOSIS — R7989 Other specified abnormal findings of blood chemistry: Secondary | ICD-10-CM

## 2012-11-17 LAB — COMPREHENSIVE METABOLIC PANEL
AST: 24 U/L (ref 0–37)
Albumin: 4.5 g/dL (ref 3.5–5.2)
Alkaline Phosphatase: 43 U/L (ref 39–117)
BUN: 12 mg/dL (ref 6–23)
Calcium: 9.7 mg/dL (ref 8.4–10.5)
Chloride: 101 mEq/L (ref 96–112)
Glucose, Bld: 109 mg/dL — ABNORMAL HIGH (ref 70–99)
Potassium: 4.7 mEq/L (ref 3.5–5.1)
Sodium: 138 mEq/L (ref 135–145)
Total Protein: 7.7 g/dL (ref 6.0–8.3)

## 2012-11-17 MED ORDER — ZOLPIDEM TARTRATE 10 MG PO TABS: ORAL_TABLET | ORAL | Status: DC

## 2012-11-17 MED ORDER — SILDENAFIL CITRATE 100 MG PO TABS: 50.0000 mg | ORAL_TABLET | Freq: Every day | ORAL | Status: DC | PRN

## 2012-11-17 MED ORDER — BUSPIRONE HCL 15 MG PO TABS: 15.0000 mg | ORAL_TABLET | Freq: Three times a day (TID) | ORAL | Status: DC

## 2012-11-17 MED ORDER — METFORMIN HCL 500 MG PO TABS: ORAL_TABLET | ORAL | Status: DC

## 2012-11-17 NOTE — Patient Instructions (Signed)
Good to see you. We will call you with your lab results.  We will call you with an appointment to see a therapist.

## 2012-11-17 NOTE — Progress Notes (Signed)
Subjective:    Patient ID: [name], male    DOB: 24-Jul-1970, 42 y.o.   MRN: [id number]  HPI  42 yo pleasant male here for 1 month follow up.  Shakiness/dizziness. Last month, he came to see me for shakiness/dizzines, with increased thirst, urination and fatigue. Does have FH of DM. A1c was high, not yet diabetic. Lab Results  Component Value Date   HGBA1C 6.2 10/13/2012   Started Metformin 500 mg daily.  Feels he is tolerating this ok- has had some diarrhea.     Anxiety-increased dose of Buspar last month due to worsening anxiety.  Still feeling anxious.  Denies depression, SI or HI.  Patient Active Problem List   Diagnosis Date Noted  . Elevated hemoglobin A1c 11/17/2012  . Shakiness 10/13/2012  . Tooth pain 09/19/2012  . Erectile dysfunction 08/08/2012  . Mass of left thigh 07/11/2012  . Sternal pain 07/11/2012  . Ventral hernia 05/26/2011  . H/O: upper GI bleed 05/26/2011  . Anxiety 05/26/2011  . DJD (degenerative joint disease) 10/14/2010  . Gastric ulcer 09/29/2010   Past Medical History  Diagnosis Date  . Upper GI bleed   . Ventral hernia   . Hiatal hernia   . History of blood clots 2012    in abdomen  . Dizziness     occasionally  . Joint pain     knees  . GERD (gastroesophageal reflux disease)     takes Protonix bid  . Gastric ulcer     history of  . Constipation     related to pain meds  . Nocturia     depends on amount of fluid he drinks  . History of blood transfusion 2012  . Anxiety     takes Xanax daily prn  . External hemorrhoid, bleeding    Past Surgical History  Procedure Laterality Date  . Abdominal surgery  10/2010    d/t bleeding ulcers; "oversewing of gastric ulcer"  . Esophagogastroduodenoscopy    . Hernia repair  09/04/11    incisional   . Incisional hernia repair  09/04/2011    Procedure: HERNIA REPAIR INCISIONAL;  Surgeon: Lodema Pilot, DO;  Location: [facility] OR;  Service: General;  Laterality: N/A;  debridment calcified mass    History  Substance Use Topics  . Smoking status: Never Smoker   . Smokeless tobacco: Current User    Types: Chew     Comment: occ-Chew  . Alcohol Use: Yes     Comment: 09/04/11 "very seldom"   Family History  Problem Relation Age of Onset  . Diabetes Mother   . Diabetes Father   . Heart disease Father    Allergies  Allergen Reactions  . Dilaudid [Hydromorphone Hcl] Rash and Other (See Comments)    Shakey  . Metoclopramide Hcl Hives, Swelling and Rash    Swelling in lips   Current Outpatient Prescriptions on File Prior to Visit  Medication Sig Dispense Refill  . pantoprazole (PROTONIX) 40 MG tablet TAKE 1 TABLET BY MOUTH TWICE DAILY  180 tablet  3   No current facility-administered medications on file prior to visit.   The PMH, PSH, Social History, Family History, Medications, and allergies have been reviewed in [facility], and have been updated if relevant.  Review of Systems See HPI    Objective:   Physical Exam BP 144/96  Pulse 85  Temp(Src) 98.1 F (36.7 C) (Oral)  Wt 225 lb (102.059 kg)  BMI 33.21 kg/m2  SpO2 97% Wt Readings from Last  3 Encounters:  11/17/12 225 lb (102.059 kg)  10/13/12 221 lb (100.245 kg)  09/19/12 226 lb (102.513 kg)   General:  Pleasant male in NAD Eyes:  PERRL Psych:  Good eye contact, not anxious or depressed appearing    Assessment & Plan:   1. Elevated hemoglobin A1c  Feels less shaky but still thinks his "blood sugar may be off." Recheck labs today. - Hemoglobin A1c - Comprehensive metabolic panel  2. Anxiety Continue current rx.  Refer to psych for psychotherapy. The patient indicates understanding of these issues and agrees with the plan.  - Ambulatory referral to Psychology

## 2012-11-18 ENCOUNTER — Encounter: Payer: Self-pay | Admitting: Family Medicine

## 2012-11-24 ENCOUNTER — Telehealth: Payer: Self-pay | Admitting: Family Medicine

## 2012-11-24 NOTE — Telephone Encounter (Signed)
Urine drug screen returned negative for ambien despite regular refills. I have placed patient at moderate risk category.

## 2012-12-01 ENCOUNTER — Ambulatory Visit (INDEPENDENT_AMBULATORY_CARE_PROVIDER_SITE_OTHER): Payer: 59 | Admitting: Psychology

## 2012-12-01 DIAGNOSIS — F4323 Adjustment disorder with mixed anxiety and depressed mood: Secondary | ICD-10-CM

## 2012-12-05 ENCOUNTER — Encounter: Payer: Self-pay | Admitting: Family Medicine

## 2012-12-05 NOTE — Telephone Encounter (Deleted)
fyi

## 2012-12-06 ENCOUNTER — Encounter: Payer: Self-pay | Admitting: Family Medicine

## 2012-12-09 ENCOUNTER — Ambulatory Visit: Payer: 59 | Admitting: Psychology

## 2013-01-18 ENCOUNTER — Encounter: Payer: Self-pay | Admitting: Family Medicine

## 2013-01-18 MED ORDER — ZOLPIDEM TARTRATE 10 MG PO TABS: ORAL_TABLET | ORAL | Status: DC

## 2013-01-18 NOTE — Telephone Encounter (Signed)
Ok to phone in ambien 

## 2013-01-18 NOTE — Telephone Encounter (Signed)
Rx called in to pharmacy. 

## 2013-01-18 NOTE — Telephone Encounter (Signed)
Ambien last refill 11/17/12 with 1 refill--please advise

## 2013-02-16 ENCOUNTER — Encounter: Payer: Self-pay | Admitting: Family Medicine

## 2013-02-17 MED ORDER — ZOLPIDEM TARTRATE 10 MG PO TABS: ORAL_TABLET | ORAL | Status: DC

## 2013-02-17 NOTE — Telephone Encounter (Signed)
Called to [facility]. 

## 2013-02-23 ENCOUNTER — Ambulatory Visit (INDEPENDENT_AMBULATORY_CARE_PROVIDER_SITE_OTHER): Payer: 59 | Admitting: Internal Medicine

## 2013-02-23 ENCOUNTER — Encounter: Payer: Self-pay | Admitting: Internal Medicine

## 2013-02-23 VITALS — BP 142/94 | HR 106 | Temp 98.1°F | Wt 226.5 lb

## 2013-02-23 DIAGNOSIS — J069 Acute upper respiratory infection, unspecified: Secondary | ICD-10-CM

## 2013-02-23 MED ORDER — AZITHROMYCIN 250 MG PO TABS: ORAL_TABLET | ORAL | Status: DC

## 2013-02-23 NOTE — Patient Instructions (Signed)

## 2013-02-23 NOTE — Progress Notes (Signed)
Pre-visit discussion using our clinic review tool. No additional management support is needed unless otherwise documented below in the visit note.  

## 2013-02-23 NOTE — Progress Notes (Signed)
HPI  Pt presents to the clinic today with c/o headache, cough, chest congestion, chills and nasal congestion. This started about 4 days ago. He reports the headache feels like pressure in the front of his head. The cough is productive of thick yellow mucous. He is not sure if he has been running a fever, but he does have chills and sweats. He has tried Sudafed and Pharmacist, hospital OTC without any relief. He has no history of allergies or ashtma. He does not smoke. He has had sick contacts.  Review of Systems      Past Medical History  Diagnosis Date  . Upper GI bleed   . Ventral hernia   . Hiatal hernia   . History of blood clots 2012    in abdomen  . Dizziness     occasionally  . Joint pain     knees  . GERD (gastroesophageal reflux disease)     takes Protonix bid  . Gastric ulcer     history of  . Constipation     related to pain meds  . Nocturia     depends on amount of fluid he drinks  . History of blood transfusion 2012  . Anxiety     takes Xanax daily prn  . External hemorrhoid, bleeding     Family History  Problem Relation Age of Onset  . Diabetes Mother   . Diabetes Father   . Heart disease Father     History   Social History  . Marital Status: Single    Spouse Name: N/A    Number of Children: N/A  . Years of Education: N/A   Occupational History  . Not on file.   Social History Main Topics  . Smoking status: Never Smoker   . Smokeless tobacco: Current User    Types: Chew     Comment: occ-Chew  . Alcohol Use: Yes     Comment: 09/04/11 "very seldom"  . Drug Use: No  . Sexual Activity: Yes   Other Topics Concern  . Not on file   Social History Narrative  . No narrative on file    Allergies  Allergen Reactions  . Dilaudid [Hydromorphone Hcl] Rash and Other (See Comments)    Shakey  . Metoclopramide Hcl Hives, Swelling and Rash    Swelling in lips     Constitutional: Positive headache, fatigue and fever. Denies abrupt weight changes.  HEENT:   Positive nasal congestion. Denies eye redness, eye pain, pressure behind the eyes, facial pain, ear pain, ringing in the ears, wax buildup, runny nose or bloody nose. Respiratory: Positive cough. Denies difficulty breathing or shortness of breath.  Cardiovascular: Denies chest pain, chest tightness, palpitations or swelling in the hands or feet.   No other specific complaints in a complete review of systems (except as listed in HPI above).  Objective:   BP 142/94  Pulse 106  Temp(Src) 98.1 F (36.7 C) (Oral)  Wt 226 lb 8 oz (102.74 kg)  SpO2 98% Wt Readings from Last 3 Encounters:  02/23/13 226 lb 8 oz (102.74 kg)  11/17/12 225 lb (102.059 kg)  10/13/12 221 lb (100.245 kg)     General: Appears his stated age, well developed, well nourished in NAD. HEENT: Head: normal shape and size; Eyes: sclera white, no icterus, conjunctiva pink, PERRLA and EOMs intact; Ears: Tm's gray and intact, normal light reflex; Nose: mucosa pink and moist, septum midline; Throat/Mouth: + PND. Teeth present, mucosa erythematous and moist, no exudate noted,  no lesions or ulcerations noted.  Neck: Mild cervical lymphadenopathy. Neck supple, trachea midline. No massses, lumps or thyromegaly present.  Cardiovascular: Normal rate and rhythm. S1,S2 noted.  No murmur, rubs or gallops noted. No JVD or BLE edema. No carotid bruits noted. Pulmonary/Chest: Normal effort and scattered rhonchi noted in the LUL and LLL. No respiratory distress. No wheezes, rales or ronchi noted.      Assessment & Plan:   Upper Respiratory Infection, likely viral at this point:  Get some rest and drink plenty of water Do salt water gargles for the sore throat In case symptoms worse going into the weekend will give Rx for Azithromax x 5 days to have filled if needed   RTC as needed or if symptoms persist.

## 2013-02-24 ENCOUNTER — Telehealth: Payer: Self-pay | Admitting: Family Medicine

## 2013-02-24 NOTE — Telephone Encounter (Signed)
Relevant patient education assigned to patient using Emmi. ° °

## 2013-04-14 ENCOUNTER — Encounter: Payer: Self-pay | Admitting: Family Medicine

## 2013-04-14 MED ORDER — ZOLPIDEM TARTRATE 10 MG PO TABS: ORAL_TABLET | ORAL | Status: DC

## 2013-04-14 MED ORDER — BUSPIRONE HCL 15 MG PO TABS: 15.0000 mg | ORAL_TABLET | Freq: Three times a day (TID) | ORAL | Status: DC

## 2013-04-14 NOTE — Telephone Encounter (Signed)
Rx called in to requested pharmacy; pt informed via mychart

## 2013-04-14 NOTE — Telephone Encounter (Signed)
Pt requesting medication refill via mychart. Last ov 11/2012 with no future appts scheduled. pls advise

## 2013-05-17 ENCOUNTER — Encounter: Payer: Self-pay | Admitting: Family Medicine

## 2013-05-17 MED ORDER — ZOLPIDEM TARTRATE 10 MG PO TABS: ORAL_TABLET | ORAL | Status: DC

## 2013-05-17 NOTE — Telephone Encounter (Signed)
mychart message sent informing pt Rx called in to requested pharmacy

## 2013-05-17 NOTE — Telephone Encounter (Signed)
Pt requesting medication refill through mychart. Last ov 11/2012 with no future appts scheduled. pls advise

## 2013-06-02 ENCOUNTER — Other Ambulatory Visit: Payer: Self-pay | Admitting: Family Medicine

## 2013-06-09 ENCOUNTER — Telehealth: Payer: Self-pay | Admitting: *Deleted

## 2013-06-09 NOTE — Telephone Encounter (Signed)
Fax received indicating PA not required for pantoprazole 40mg 

## 2013-06-16 ENCOUNTER — Encounter: Payer: Self-pay | Admitting: Family Medicine

## 2013-06-16 NOTE — Telephone Encounter (Signed)
Message sent back to pt via mychart informing him to check with pharmacy for refill. Last Rx for [name] was filled on 05/17/13 #30 1R.

## 2013-06-16 NOTE — Telephone Encounter (Signed)
Spoke to pharmacy and confirmed Rx and refill. Pt notified via mychart

## 2013-07-03 ENCOUNTER — Other Ambulatory Visit: Payer: Self-pay | Admitting: Family Medicine

## 2013-07-03 MED ORDER — METFORMIN HCL 500 MG PO TABS: ORAL_TABLET | ORAL | Status: DC

## 2013-07-03 NOTE — Telephone Encounter (Signed)
Spoke to pt and informed him an OV is required; pt given refill and appt sched for 07/12/2013

## 2013-07-12 ENCOUNTER — Telehealth: Payer: Self-pay

## 2013-07-12 ENCOUNTER — Encounter: Payer: Self-pay | Admitting: Family Medicine

## 2013-07-12 ENCOUNTER — Ambulatory Visit (INDEPENDENT_AMBULATORY_CARE_PROVIDER_SITE_OTHER): Payer: 59 | Admitting: Family Medicine

## 2013-07-12 VITALS — BP 142/92 | HR 101 | Temp 98.1°F | Ht 69.0 in | Wt 227.5 lb

## 2013-07-12 DIAGNOSIS — F419 Anxiety disorder, unspecified: Secondary | ICD-10-CM

## 2013-07-12 DIAGNOSIS — R7309 Other abnormal glucose: Secondary | ICD-10-CM

## 2013-07-12 DIAGNOSIS — F411 Generalized anxiety disorder: Secondary | ICD-10-CM

## 2013-07-12 DIAGNOSIS — Z136 Encounter for screening for cardiovascular disorders: Secondary | ICD-10-CM

## 2013-07-12 LAB — LIPID PANEL
CHOL/HDL RATIO: 3
Cholesterol: 148 mg/dL (ref 0–200)
HDL: 49.9 mg/dL (ref >39.00)
LDL Cholesterol: 71 mg/dL (ref 0–99)
NONHDL: 98.1
TRIGLYCERIDES: 134 mg/dL (ref 0.0–149.0)
VLDL: 26.8 mg/dL (ref 0.0–40.0)

## 2013-07-12 LAB — COMPREHENSIVE METABOLIC PANEL
ALT: 22 U/L (ref 0–53)
AST: 20 U/L (ref 0–37)
Albumin: 4.1 g/dL (ref 3.5–5.2)
Alkaline Phosphatase: 37 U/L — ABNORMAL LOW (ref 39–117)
BUN: 9 mg/dL (ref 6–23)
CALCIUM: 9 mg/dL (ref 8.4–10.5)
CO2: 26 mEq/L (ref 19–32)
Chloride: 104 mEq/L (ref 96–112)
Creatinine, Ser: 1.1 mg/dL (ref 0.4–1.5)
GFR: 78.42 mL/min (ref >60.00)
Glucose, Bld: 111 mg/dL — ABNORMAL HIGH (ref 70–99)
POTASSIUM: 3.9 meq/L (ref 3.5–5.1)
Sodium: 138 mEq/L (ref 135–145)
TOTAL PROTEIN: 6.9 g/dL (ref 6.0–8.3)
Total Bilirubin: 0.5 mg/dL (ref 0.2–1.2)

## 2013-07-12 LAB — HEMOGLOBIN A1C: Hgb A1c MFr Bld: 6 % (ref 4.6–6.5)

## 2013-07-12 MED ORDER — BUSPIRONE HCL 30 MG PO TABS: 30.0000 mg | ORAL_TABLET | Freq: Two times a day (BID) | ORAL | Status: DC

## 2013-07-12 MED ORDER — ZOLPIDEM TARTRATE 10 MG PO TABS: ORAL_TABLET | ORAL | Status: DC

## 2013-07-12 NOTE — Telephone Encounter (Signed)
Yes ok to change

## 2013-07-12 NOTE — Telephone Encounter (Signed)
Attempted to contact pharmacy; line busy. New Rx sent as requested

## 2013-07-12 NOTE — Assessment & Plan Note (Signed)
>  25 minutes spent in face to face time with patient, >50% spent in counselling or coordination of care Deteriorated. Will d/c mid day dose but increase total daily dose- Buspar 30 mg twice daily. eRx sent to pharmacy.

## 2013-07-12 NOTE — Telephone Encounter (Signed)
Baxter Flattery with [facility] left v/m requesting Buspar 30 mg quantity changed to # 180 for 90 day supply. [place] request cb.

## 2013-07-12 NOTE — Patient Instructions (Signed)
Good to see you. We are increasing your buspar 30 mg twice daily. Keep me updated.

## 2013-07-12 NOTE — Progress Notes (Signed)
Subjective:    Patient ID: [name], male    DOB: [birth year], 43 y.o.   MRN: [id number]  HPI  43 yo pleasant male here for follow up.  Elevated a1c-   Lab Results  Component Value Date   HGBA1C 6.0 11/17/2012   Started Metformin 500 mg daily last fall.  Feels he is tolerating this ok- diarrhea resolved.  Lab Results  Component Value Date   CHOL 171 05/26/2011   HDL 63.90 05/26/2011   LDLCALC 89 05/26/2011   TRIG 89.0 05/26/2011   CHOLHDL 3 05/26/2011      Anxiety-On Buspar 15 mg three times daily, ambien at bedtime.  .  Denies depression, SI or HI.  Does still have some anxiety and feels the mid day dose of Buspar causes shakiness.  Patient Active Problem List   Diagnosis Date Noted  . Elevated hemoglobin A1c 11/17/2012  . Erectile dysfunction 08/08/2012  . Ventral hernia 05/26/2011  . H/O: upper GI bleed 05/26/2011  . Anxiety 05/26/2011  . DJD (degenerative joint disease) 10/14/2010  . Gastric ulcer 09/29/2010   Past Medical History  Diagnosis Date  . Upper GI bleed   . Ventral hernia   . Hiatal hernia   . History of blood clots 2012    in abdomen  . Dizziness     occasionally  . Joint pain     knees  . GERD (gastroesophageal reflux disease)     takes Protonix bid  . Gastric ulcer     history of  . Constipation     related to pain meds  . Nocturia     depends on amount of fluid he drinks  . History of blood transfusion 2012  . Anxiety     takes Xanax daily prn  . External hemorrhoid, bleeding    Past Surgical History  Procedure Laterality Date  . Abdominal surgery  10/2010    d/t bleeding ulcers; "oversewing of gastric ulcer"  . Esophagogastroduodenoscopy    . Hernia repair  09/04/11    incisional   . Incisional hernia repair  09/04/2011    Procedure: HERNIA REPAIR INCISIONAL;  Surgeon: Madilyn Hook, DO;  Location: [facility];  Service: General;  Laterality: N/A;  debridment calcified mass   History  Substance Use Topics  . Smoking status: Never Smoker    . Smokeless tobacco: Current User    Types: Chew     Comment: occ-Chew  . Alcohol Use: Yes     Comment: 09/04/11 "very seldom"   Family History  Problem Relation Age of Onset  . Diabetes Mother   . Diabetes Father   . Heart disease Father    Allergies  Allergen Reactions  . Dilaudid [Hydromorphone Hcl] Rash and Other (See Comments)    Shakey  . Metoclopramide Hcl Hives, Swelling and Rash    Swelling in lips   Current Outpatient Prescriptions on File Prior to Visit  Medication Sig Dispense Refill  . busPIRone (BUSPAR) 15 MG tablet Take 1 tablet (15 mg total) by mouth 3 (three) times daily.  90 tablet  5  . metFORMIN (GLUCOPHAGE) 500 MG tablet TAKE 1 TABLET BY MOUTH EVERY MORNING WITH BREAKFAST  30 tablet  0  . pantoprazole (PROTONIX) 40 MG tablet TAKE 1 TABLET BY MOUTH TWICE DAILY  180 tablet  1  . sildenafil (VIAGRA) 100 MG tablet Take 0.5-1 tablets (50-100 mg total) by mouth daily as needed for erectile dysfunction.  15 tablet  5   No current facility-administered  medications on file prior to visit.   The PMH, PSH, Social History, Family History, Medications, and allergies have been reviewed in [facility], and have been updated if relevant.  Review of Systems See HPI    Objective:   Physical Exam BP 142/92  Pulse 101  Temp(Src) 98.1 F (36.7 C) (Oral)  Ht 5\' 9"  (1.753 m)  Wt 227 lb 8 oz (103.193 kg)  BMI 33.58 kg/m2  SpO2 97% Wt Readings from Last 3 Encounters:  07/12/13 227 lb 8 oz (103.193 kg)  02/23/13 226 lb 8 oz (102.74 kg)  11/17/12 225 lb (102.059 kg)   General:  Pleasant male in NAD Eyes:  PERRL Psych:  Good eye contact, not anxious or depressed appearing    Assessment & Plan:

## 2013-07-12 NOTE — Assessment & Plan Note (Signed)
Due for labs today.  On Metformin 500 mg daily. Orders Placed This Encounter  Procedures  . Hemoglobin A1c  . Comprehensive metabolic panel  . Lipid panel

## 2013-07-24 ENCOUNTER — Ambulatory Visit (INDEPENDENT_AMBULATORY_CARE_PROVIDER_SITE_OTHER): Payer: 59 | Admitting: Family Medicine

## 2013-07-24 ENCOUNTER — Encounter: Payer: Self-pay | Admitting: Family Medicine

## 2013-07-24 ENCOUNTER — Ambulatory Visit (HOSPITAL_COMMUNITY)
Admission: RE | Admit: 2013-07-24 | Discharge: 2013-07-24 | Disposition: A | Discharge status: Home / Self Care | Payer: 59 | Source: Ambulatory Visit | Attending: Family Medicine | Admitting: Family Medicine

## 2013-07-24 ENCOUNTER — Encounter (HOSPITAL_COMMUNITY): Payer: Self-pay

## 2013-07-24 VITALS — BP 130/80 | HR 102 | Temp 99.0°F | Ht 69.0 in | Wt 231.0 lb

## 2013-07-24 DIAGNOSIS — R7309 Other abnormal glucose: Secondary | ICD-10-CM

## 2013-07-24 DIAGNOSIS — M2420 Disorder of ligament, unspecified site: Secondary | ICD-10-CM | POA: Insufficient documentation

## 2013-07-24 DIAGNOSIS — M629 Disorder of muscle, unspecified: Secondary | ICD-10-CM | POA: Insufficient documentation

## 2013-07-24 DIAGNOSIS — S301XXA Contusion of abdominal wall, initial encounter: Secondary | ICD-10-CM

## 2013-07-24 DIAGNOSIS — K449 Diaphragmatic hernia without obstruction or gangrene: Secondary | ICD-10-CM | POA: Insufficient documentation

## 2013-07-24 DIAGNOSIS — K4020 Bilateral inguinal hernia, without obstruction or gangrene, not specified as recurrent: Secondary | ICD-10-CM | POA: Insufficient documentation

## 2013-07-24 DIAGNOSIS — R1032 Left lower quadrant pain: Secondary | ICD-10-CM | POA: Insufficient documentation

## 2013-07-24 LAB — HEPATIC FUNCTION PANEL
ALT: 21 U/L (ref 0–53)
AST: 26 U/L (ref 0–37)
Albumin: 4.2 g/dL (ref 3.5–5.2)
Alkaline Phosphatase: 42 U/L (ref 39–117)
Bilirubin, Direct: 0 mg/dL (ref 0.0–0.3)
TOTAL PROTEIN: 6.9 g/dL (ref 6.0–8.3)
Total Bilirubin: 0.4 mg/dL (ref 0.2–1.2)

## 2013-07-24 LAB — CBC WITH DIFFERENTIAL/PLATELET
Basophils Absolute: 0.1 10*3/uL (ref 0.0–0.1)
Basophils Relative: 0.4 % (ref 0.0–3.0)
EOS PCT: 1.2 % (ref 0.0–5.0)
Eosinophils Absolute: 0.2 10*3/uL (ref 0.0–0.7)
HCT: 44.6 % (ref 39.0–52.0)
HEMOGLOBIN: 15.1 g/dL (ref 13.0–17.0)
LYMPHS PCT: 15.4 % (ref 12.0–46.0)
Lymphs Abs: 2.2 10*3/uL (ref 0.7–4.0)
MCHC: 33.8 g/dL (ref 30.0–36.0)
MCV: 89.9 fl (ref 78.0–100.0)
MONOS PCT: 9.1 % (ref 3.0–12.0)
Monocytes Absolute: 1.3 10*3/uL — ABNORMAL HIGH (ref 0.1–1.0)
NEUTROS ABS: 10.4 10*3/uL — AB (ref 1.4–7.7)
Neutrophils Relative %: 73.9 % (ref 43.0–77.0)
Platelets: 347 10*3/uL (ref 150.0–400.0)
RBC: 4.96 Mil/uL (ref 4.22–5.81)
RDW: 13.7 % (ref 11.5–15.5)
WBC: 14 10*3/uL — AB (ref 4.0–10.5)

## 2013-07-24 LAB — LIPASE: LIPASE: 29 U/L (ref 11.0–59.0)

## 2013-07-24 LAB — BASIC METABOLIC PANEL
BUN: 9 mg/dL (ref 6–23)
CHLORIDE: 103 meq/L (ref 96–112)
CO2: 30 meq/L (ref 19–32)
Calcium: 9.3 mg/dL (ref 8.4–10.5)
Creatinine, Ser: 1.1 mg/dL (ref 0.4–1.5)
GFR: 78.41 mL/min (ref >60.00)
GLUCOSE: 102 mg/dL — AB (ref 70–99)
POTASSIUM: 4.9 meq/L (ref 3.5–5.1)
Sodium: 137 mEq/L (ref 135–145)

## 2013-07-24 MED ORDER — IOHEXOL 300 MG/ML  SOLN
100.0000 mL | Freq: Once | INTRAMUSCULAR | Status: AC | PRN
  Administered 2013-07-24: 100 mL via INTRAVENOUS

## 2013-07-24 MED ORDER — TRAMADOL HCL 50 MG PO TABS: 50.0000 mg | ORAL_TABLET | Freq: Four times a day (QID) | ORAL | Status: DC | PRN

## 2013-07-24 NOTE — Progress Notes (Signed)
Pre visit review using our clinic review tool, if applicable. No additional management support is needed unless otherwise documented below in the visit note. 

## 2013-07-24 NOTE — Patient Instructions (Signed)
REFERRALS TO SPECIALISTS, SPECIAL TESTS (MRI, CT, ULTRASOUNDS)  GO THE WAITING ROOM AND TELL CHECK IN YOU NEED HELP WITH A REFERRAL. Either MARION or LINDA will help you set it up.  If it is between 1-2 PM they may be at lunch.  After 5 PM, they will likely be at home.  They will call you, so please make sure the office has your correct phone number.  Referrals sometimes can be done same day if urgent, but others can take 2 or 3 days to get an appointment. Starting in 2015, many of the new Medicare insurance plans and Affordable Care Act (Obamacare) Health plans offered take much longer for referrals. They have added additional paperwork and steps.  MRI's and CT's can take up to a week for the test. (Emergencies like strokes take precedence. I will tell you if you have an emergency.)   If your referral is to an in-network [facility] office, their office may contact you directly prior to our office reaching you.  -- Examples: [facility], [facility], [facility], [facility], [facility], and many more.  Specialist appointment times vary a great deal, mostly on the specialist's schedule and if they have openings. -- Our office tries to get you in as fast as possible. -- Some specialists have very long wait times. (Example. Dermatology. Usually months) -- If you have a true emergency like new cancer, we work to get you in ASAP.   

## 2013-07-24 NOTE — Progress Notes (Signed)
[place] [address] Phone: [phone] Fax: [phone]  Patient ID: [name] MRN: [id number], DOB: 1970-04-01, 43 y.o. Date of Encounter: 07/24/2013  Primary Physician:  Arnette Norris, MD   Chief Complaint: Flank Pain   Subjective:   History of Present Illness:  [name] is a 43 y.o. very pleasant male patient who presents with the following:  Date of injury, July 20, 2013.  Pain on the left side and constant. Has been there for 4 or 5 days. Getting worse. Hurts to touch and to move. Had a breakfast. No n/v. BM this morning. No blood. "darker" but not like melena. He does have a history that is significant for multiple abdominal surgeries including abdominal surgery for bleeding ulcer. He also had a recent hernia repair in 2013. He has no known history of trauma. He is not vomiting. He is not having any significant coughing. No chest pain. No shortness of breath.  [phone] Cell phone.  Patient Active Problem List   Diagnosis Date Noted  . Elevated hemoglobin A1c 11/17/2012  . Erectile dysfunction 08/08/2012  . Ventral hernia 05/26/2011  . H/O: upper GI bleed 05/26/2011  . Anxiety 05/26/2011  . DJD (degenerative joint disease) 10/14/2010  . Gastric ulcer 09/29/2010    Past Medical History  Diagnosis Date  . Upper GI bleed   . Ventral hernia   . Hiatal hernia   . History of blood clots 2012    in abdomen  . Dizziness     occasionally  . Joint pain     knees  . GERD (gastroesophageal reflux disease)     takes Protonix bid  . Gastric ulcer     history of  . Constipation     related to pain meds  . Nocturia     depends on amount of fluid he drinks  . History of blood transfusion 2012  . Anxiety     takes Xanax daily prn  . External hemorrhoid, bleeding     Past Surgical History  Procedure Laterality Date  . Abdominal surgery  10/2010    d/t bleeding ulcers; "oversewing of gastric ulcer"  . Esophagogastroduodenoscopy    . Hernia  repair  09/04/11    incisional   . Incisional hernia repair  09/04/2011    Procedure: HERNIA REPAIR INCISIONAL;  Surgeon: Madilyn Hook, DO;  Location: [facility];  Service: General;  Laterality: N/A;  debridment calcified mass    History   Social History  . Marital Status: Single    Spouse Name: N/A    Number of Children: N/A  . Years of Education: N/A   Occupational History  . Not on file.   Social History Main Topics  . Smoking status: Never Smoker   . Smokeless tobacco: Current User    Types: Chew     Comment: occ-Chew  . Alcohol Use: Yes     Comment: 09/04/11 "very seldom"  . Drug Use: No  . Sexual Activity: Yes   Other Topics Concern  . Not on file   Social History Narrative  . No narrative on file    Family History  Problem Relation Age of Onset  . Diabetes Mother   . Diabetes Father   . Heart disease Father     Allergies  Allergen Reactions  . Dilaudid [Hydromorphone Hcl] Rash and Other (See Comments)    Shakey  . Metoclopramide Hcl Hives, Swelling and Rash    Swelling in lips  Medication list reviewed and updated in full in [place].   Past Medical History, Surgical History, Social History, Family History, Problem List, Medications, and Allergies have been reviewed and updated if relevant.  Review of Systems:  GEN: No acute illnesses, no fevers, chills. GI: No n/v/d, eating normally Pulm: No SOB Interactive and getting along well at home.  Otherwise, ROS is as per the HPI.  Objective:   Physical Examination: BP 130/80  Pulse 102  Temp(Src) 99 F (37.2 C) (Oral)  Ht 5\' 9"  (1.753 m)  Wt 231 lb (104.781 kg)  BMI 34.10 kg/m2   GEN: WDWN, NAD, Non-toxic, A & O x 3 HEENT: Atraumatic, Normocephalic. Neck supple. No masses, No LAD. Ears and Nose: No external deformity. CV: RRR, No M/G/R. No JVD. No thrill. No extra heart sounds. PULM: CTA B, no wheezes, crackles, rhonchi. No retractions. No resp. distress. No accessory muscle use. ABD: S,  moderate abdominal tenderness, most in LLQ, ND, + BS, no clear rebound, No HSM  EXTR: No c/c/e NEURO Normal gait.  PSYCH: Normally interactive. Conversant. Not depressed or anxious appearing.  Calm demeanor.   Laboratory and Imaging Data: Results for orders placed in visit on 07/24/13  CBC WITH DIFFERENTIAL      Result Value Ref Range   WBC 14.0 (*) 4.0 - 10.5 K/uL   RBC 4.96  4.22 - 5.81 Mil/uL   Hemoglobin 15.1  13.0 - 17.0 g/dL   HCT 44.6  39.0 - 52.0 %   MCV 89.9  78.0 - 100.0 fl   MCHC 33.8  30.0 - 36.0 g/dL   RDW 13.7  11.5 - 15.5 %   Platelets 347.0  150.0 - 400.0 K/uL   Neutrophils Relative % 73.9  43.0 - 77.0 %   Lymphocytes Relative 15.4  12.0 - 46.0 %   Monocytes Relative 9.1  3.0 - 12.0 %   Eosinophils Relative 1.2  0.0 - 5.0 %   Basophils Relative 0.4  0.0 - 3.0 %   Neutro Abs 10.4 (*) 1.4 - 7.7 K/uL   Lymphs Abs 2.2  0.7 - 4.0 K/uL   Monocytes Absolute 1.3 (*) 0.1 - 1.0 K/uL   Eosinophils Absolute 0.2  0.0 - 0.7 K/uL   Basophils Absolute 0.1  0.0 - 0.1 K/uL  BASIC METABOLIC PANEL      Result Value Ref Range   Sodium 137  135 - 145 mEq/L   Potassium 4.9  3.5 - 5.1 mEq/L   Chloride 103  96 - 112 mEq/L   CO2 30  19 - 32 mEq/L   Glucose, Bld 102 (*) 70 - 99 mg/dL   BUN 9  6 - 23 mg/dL   Creatinine, Ser 1.1  0.4 - 1.5 mg/dL   Calcium 9.3  8.4 - 10.5 mg/dL   GFR 78.41  >60.00 mL/min  HEPATIC FUNCTION PANEL      Result Value Ref Range   Total Bilirubin 0.4  0.2 - 1.2 mg/dL   Bilirubin, Direct 0.0  0.0 - 0.3 mg/dL   Alkaline Phosphatase 42  39 - 117 U/L   AST 26  0 - 37 U/L   ALT 21  0 - 53 U/L   Total Protein 6.9  6.0 - 8.3 g/dL   Albumin 4.2  3.5 - 5.2 g/dL  LIPASE      Result Value Ref Range   Lipase 29.0  11.0 - 59.0 U/L    Ct Abdomen Pelvis W Contrast  07/24/2013  ADDENDUM REPORT: 07/24/2013 15:25  ADDENDUM: These results were called by telephone at the time of interpretation on 07/24/2013 at 3:25 PM to Dr. Owens Loffler , who verbally acknowledged  these results.   Electronically Signed   By: Rolm Baptise M.D.   On: 07/24/2013 15:25   07/24/2013   CLINICAL DATA:  Left lower quadrant pain.  EXAM: CT ABDOMEN AND PELVIS WITH CONTRAST  TECHNIQUE: Multidetector CT imaging of the abdomen and pelvis was performed using the standard protocol following bolus administration of intravenous contrast.  CONTRAST:  125mL OMNIPAQUE IOHEXOL 300 MG/ML  SOLN  COMPARISON:  02/19/2012  FINDINGS: Small hiatal hernia, stable. Heart is normal size. Lung bases are clear. No effusions.  Liver, gallbladder, spleen, pancreas, adrenals and kidneys are normal.  There is mild asymmetry in size of the rectus muscles in the lower pelvis, left larger than right. Mild stranding around the left rectus muscle. Findings most compatible with a small spontaneous rectus hematoma. This is difficult to measure due to no well-defined high-density areas at this time. No active extravasation noted.  Large and small bowel unremarkable. No evidence of diverticulosis or diverticulitis. Urinary bladder and prostate grossly unremarkable. Small bilateral inguinal hernias and scratch has small bilateral inguinal lymph nodes and iliac lymph nodes, none pathologically enlarged.  No acute bony abnormality or focal bone lesion.  IMPRESSION: Slight enlargement of the left lower rectus muscle with surrounding stranding, most compatible with small left lower rectus sheath hematoma. No active extravasation.  Electronically Signed: By: Rolm Baptise M.D. On: 07/24/2013 15:06    Assessment & Plan:   Rectus sheath hematoma, initial encounter  Abdominal pain, left lower quadrant - Plan: CBC with Differential, Basic metabolic panel, Hepatic function panel, Lipase, CT Abdomen Pelvis W Contrast  Elevated hemoglobin A1c  Clinical concern for potential acute abdomen based on examination. Obtain STAT labs and STAT CT of the abdomen and pelvis to evaluate for diverticulitis, perf of abdomen or other acute process, which  was found to be a L lower rectus sheath hematoma. All discussed with the patient by phone by myself. Should be self-limiting and resolve within the next 2-3 weeks.   Severe pain, fever, dramatic changes and red flags reviewed.   New Prescriptions   TRAMADOL (ULTRAM) 50 MG TABLET    Take 1 tablet (50 mg total) by mouth every 6 (six) hours as needed.   Modified Medications   No medications on file   Orders Placed This Encounter  Procedures  . CT Abdomen Pelvis W Contrast  . CBC with Differential  . Basic metabolic panel  . Hepatic function panel  . Lipase   Follow-up: No Follow-up on file. Unless noted above, the patient is to follow-up if symptoms worsen. Red flags were reviewed with the patient.  Signed,  Maud Deed. Kloi Brodman, MD, [facility]   Discontinued Medications   No medications on file   Current Medications at Discharge:   Medication List       This list is accurate as of: 07/24/13 11:59 PM.  Always use your most recent med list.               busPIRone 30 MG tablet  Commonly known as:  BUSPAR  Take 1 tablet (30 mg total) by mouth 2 (two) times daily.     metFORMIN 500 MG tablet  Commonly known as:  GLUCOPHAGE  TAKE 1 TABLET BY MOUTH EVERY MORNING WITH BREAKFAST     pantoprazole 40 MG tablet  Commonly  known as:  PROTONIX  TAKE 1 TABLET BY MOUTH TWICE DAILY     sildenafil 100 MG tablet  Commonly known as:  VIAGRA  Take 0.5-1 tablets (50-100 mg total) by mouth daily as needed for erectile dysfunction.     traMADol 50 MG tablet  Commonly known as:  ULTRAM  Take 1 tablet (50 mg total) by mouth every 6 (six) hours as needed.     zolpidem 10 MG tablet  Commonly known as:  AMBIEN  TAKE 1 TABLET BY MOUTH AT BEDTIME AS NEEDED

## 2013-07-26 ENCOUNTER — Encounter: Payer: Self-pay | Admitting: Family Medicine

## 2013-07-26 MED ORDER — HYDROCODONE-ACETAMINOPHEN 5-325 MG PO TABS: 1.0000 | ORAL_TABLET | Freq: Four times a day (QID) | ORAL | Status: DC | PRN

## 2013-08-07 ENCOUNTER — Encounter: Payer: Self-pay | Admitting: Family Medicine

## 2013-08-07 MED ORDER — METFORMIN HCL 500 MG PO TABS: ORAL_TABLET | ORAL | Status: DC

## 2013-08-16 ENCOUNTER — Encounter: Payer: Self-pay | Admitting: Family Medicine

## 2013-08-16 MED ORDER — ZOLPIDEM TARTRATE 10 MG PO TABS: ORAL_TABLET | ORAL | Status: DC

## 2013-08-16 NOTE — Telephone Encounter (Signed)
Pt requesting medication refill via mychart. Last f/u app 07/2013 with no future appts scheduled. pls advise

## 2013-08-16 NOTE — Telephone Encounter (Signed)
Message sent to pt via mychart informing him Rx has been faxed to requested pharmacy

## 2013-08-23 ENCOUNTER — Ambulatory Visit: Payer: 59 | Admitting: Psychology

## 2013-09-11 ENCOUNTER — Encounter: Payer: Self-pay | Admitting: Internal Medicine

## 2013-09-11 ENCOUNTER — Ambulatory Visit (INDEPENDENT_AMBULATORY_CARE_PROVIDER_SITE_OTHER): Payer: 59 | Admitting: Internal Medicine

## 2013-09-11 VITALS — BP 128/82 | HR 105 | Temp 98.7°F | Wt 225.0 lb

## 2013-09-11 DIAGNOSIS — H9209 Otalgia, unspecified ear: Secondary | ICD-10-CM

## 2013-09-11 DIAGNOSIS — H9203 Otalgia, bilateral: Secondary | ICD-10-CM

## 2013-09-11 DIAGNOSIS — H6980 Other specified disorders of Eustachian tube, unspecified ear: Secondary | ICD-10-CM

## 2013-09-11 DIAGNOSIS — H6983 Other specified disorders of Eustachian tube, bilateral: Secondary | ICD-10-CM

## 2013-09-11 NOTE — Patient Instructions (Addendum)
Otalgia  The most common reason for this in children is an infection of the middle ear. Pain from the middle ear is usually caused by a build-up of fluid and pressure behind the eardrum. Pain from an earache can be sharp, dull, or burning. The pain may be temporary or constant. The middle ear is connected to the nasal passages by a short narrow tube called the Eustachian tube. The Eustachian tube allows fluid to drain out of the middle ear, and helps keep the pressure in your ear equalized.  CAUSES   A cold or allergy can block the Eustachian tube with inflammation and the build-up of secretions. This is especially likely in small children, because their Eustachian tube is shorter and more horizontal. When the Eustachian tube closes, the normal flow of fluid from the middle ear is stopped. Fluid can accumulate and cause stuffiness, pain, hearing loss, and an ear infection if germs start growing in this area.  SYMPTOMS   The symptoms of an ear infection may include fever, ear pain, fussiness, increased crying, and irritability. Many children will have temporary and minor hearing loss during and right after an ear infection. Permanent hearing loss is rare, but the risk increases the more infections a child has. Other causes of ear pain include retained water in the outer ear canal from swimming and bathing.  Ear pain in adults is less likely to be from an ear infection. Ear pain may be referred from other locations. Referred pain may be from the joint between your jaw and the skull. It may also come from a tooth problem or problems in the neck. Other causes of ear pain include:   A foreign body in the ear.   Outer ear infection.   Sinus infections.   Impacted ear wax.   Ear injury.   Arthritis of the jaw or TMJ problems.   Middle ear infection.   Tooth infections.   Sore throat with pain to the ears.  DIAGNOSIS   Your caregiver can usually make the diagnosis by examining you. Sometimes other special studies,  including x-rays and lab work may be necessary.  TREATMENT    If antibiotics were prescribed, use them as directed and finish them even if you or your child's symptoms seem to be improved.   Sometimes PE tubes are needed in children. These are little plastic tubes which are put into the eardrum during a simple surgical procedure. They allow fluid to drain easier and allow the pressure in the middle ear to equalize. This helps relieve the ear pain caused by pressure changes.  HOME CARE INSTRUCTIONS    Only take over-the-counter or prescription medicines for pain, discomfort, or fever as directed by your caregiver. DO NOT GIVE CHILDREN ASPIRIN because of the association of Reye's Syndrome in children taking aspirin.   Use a cold pack applied to the outer ear for 15-20 minutes, 03-04 times per day or as needed may reduce pain. Do not apply ice directly to the skin. You may cause frost bite.   Over-the-counter ear drops used as directed may be effective. Your caregiver may sometimes prescribe ear drops.   Resting in an upright position may help reduce pressure in the middle ear and relieve pain.   Ear pain caused by rapidly descending from high altitudes can be relieved by swallowing or chewing gum. Allowing infants to suck on a bottle during airplane travel can help.   Do not smoke in the house or near children. If you are   unable to quit smoking, smoke outside.   Control allergies.  SEEK IMMEDIATE MEDICAL CARE IF:    You or your child are becoming sicker.   Pain or fever relief is not obtained with medicine.   You or your child's symptoms (pain, fever, or irritability) do not improve within 24 to 48 hours or as instructed.   Severe pain suddenly stops hurting. This may indicate a ruptured eardrum.   You or your children develop new problems such as severe headaches, stiff neck, difficulty swallowing, or swelling of the face or around the ear.  Document Released: 09/06/2003 Document Revised: 04/13/2011  Document Reviewed: 01/11/2008  ExitCare Patient Information 2015 [employer]. This information is not intended to replace advice given to you by your health care provider. Make sure you discuss any questions you have with your health care provider.

## 2013-09-11 NOTE — Progress Notes (Signed)
Subjective:    Patient ID: [name], male    DOB: 04/24/1970, 43 y.o.   MRN: [id number]  HPI  Pt presents to the clinic today with c/o ear pain. This started 2 days ago. It did start in the left ear and has moved to the right ear. He has had some associated hearing loss. He denies fever, chills or body aches. He has not had any trauma to the head. He has not tried anything OTC.  Review of Systems      Past Medical History  Diagnosis Date  . Upper GI bleed   . Ventral hernia   . Hiatal hernia   . History of blood clots 2012    in abdomen  . Dizziness     occasionally  . Joint pain     knees  . GERD (gastroesophageal reflux disease)     takes Protonix bid  . Gastric ulcer     history of  . Constipation     related to pain meds  . Nocturia     depends on amount of fluid he drinks  . History of blood transfusion 2012  . Anxiety     takes Xanax daily prn  . External hemorrhoid, bleeding     Current Outpatient Prescriptions  Medication Sig Dispense Refill  . busPIRone (BUSPAR) 30 MG tablet Take 1 tablet (30 mg total) by mouth 2 (two) times daily.  180 tablet  0  . HYDROcodone-acetaminophen (NORCO/VICODIN) 5-325 MG per tablet Take 1 tablet by mouth every 6 (six) hours as needed for moderate pain.  40 tablet  0  . metFORMIN (GLUCOPHAGE) 500 MG tablet TAKE 1 TABLET BY MOUTH EVERY MORNING WITH BREAKFAST  90 tablet  0  . pantoprazole (PROTONIX) 40 MG tablet TAKE 1 TABLET BY MOUTH TWICE DAILY  180 tablet  1  . sildenafil (VIAGRA) 100 MG tablet Take 0.5-1 tablets (50-100 mg total) by mouth daily as needed for erectile dysfunction.  15 tablet  5  . traMADol (ULTRAM) 50 MG tablet Take 1 tablet (50 mg total) by mouth every 6 (six) hours as needed.  50 tablet  0  . zolpidem (AMBIEN) 10 MG tablet TAKE 1 TABLET BY MOUTH AT BEDTIME AS NEEDED  30 tablet  0   No current facility-administered medications for this visit.    Allergies  Allergen Reactions  . Dilaudid [Hydromorphone  Hcl] Rash and Other (See Comments)    Shakey  . Metoclopramide Hcl Hives, Swelling and Rash    Swelling in lips    Family History  Problem Relation Age of Onset  . Diabetes Mother   . Diabetes Father   . Heart disease Father     History   Social History  . Marital Status: Single    Spouse Name: N/A    Number of Children: N/A  . Years of Education: N/A   Occupational History  . Not on file.   Social History Main Topics  . Smoking status: Never Smoker   . Smokeless tobacco: Current User    Types: Chew     Comment: occ-Chew  . Alcohol Use: Yes     Comment: 09/04/11 "very seldom"  . Drug Use: No  . Sexual Activity: Yes   Other Topics Concern  . Not on file   Social History Narrative  . No narrative on file     Constitutional: Denies fever, malaise, fatigue, headache or abrupt weight changes.  HEENT: Pt reports ear pain. Denies eye pain,  eye redness,  ringing in the ears, wax buildup, runny nose, nasal congestion, bloody nose, or sore throat. Respiratory: Denies difficulty breathing, shortness of breath, cough or sputum production.     No other specific complaints in a complete review of systems (except as listed in HPI above).  Objective:   Physical Exam   BP 128/82  Pulse 105  Temp(Src) 98.7 F (37.1 C) (Oral)  Wt 225 lb (102.059 kg)  SpO2 98% Wt Readings from Last 3 Encounters:  09/11/13 225 lb (102.059 kg)  07/24/13 231 lb (104.781 kg)  07/12/13 227 lb 8 oz (103.193 kg)    General: Appears their stated age, well developed, well nourished in NAD. Skin: Warm, dry and intact. No rashes, lesions or ulcerations noted. HEENT: Head: normal shape and size; Eyes: sclera white, no icterus, conjunctiva pink, PERRLA and EOMs intact; Ears: Tm's red and intact, normal light reflex,. + effusion noted; Nose: mucosa pink and moist, septum midline; Throat/Mouth: Teeth present, mucosa pink and moist, no exudate, lesions or ulcerations noted.  Cardiovascular: Normal rate  and rhythm. S1,S2 noted.  No murmur, rubs or gallops noted. No JVD or BLE edema. No carotid bruits noted. Pulmonary/Chest: Normal effort and positive vesicular breath sounds. No respiratory distress. No wheezes, rales or ronchi noted.    BMET    Component Value Date/Time   NA 137 07/24/2013 1051   K 4.9 07/24/2013 1051   CL 103 07/24/2013 1051   CO2 30 07/24/2013 1051   GLUCOSE 102* 07/24/2013 1051   BUN 9 07/24/2013 1051   CREATININE 1.1 07/24/2013 1051   CALCIUM 9.3 07/24/2013 1051   GFRNONAA >90 09/08/2011 0515   GFRAA >90 09/08/2011 0515    Lipid Panel     Component Value Date/Time   CHOL 148 07/12/2013 0828   TRIG 134.0 07/12/2013 0828   HDL 49.90 07/12/2013 0828   CHOLHDL 3 07/12/2013 0828   VLDL 26.8 07/12/2013 0828   LDLCALC 71 07/12/2013 0828    CBC    Component Value Date/Time   WBC 14.0* 07/24/2013 1051   RBC 4.96 07/24/2013 1051   HGB 15.1 07/24/2013 1051   HCT 44.6 07/24/2013 1051   PLT 347.0 07/24/2013 1051   MCV 89.9 07/24/2013 1051   MCH 30.4 09/08/2011 0515   MCHC 33.8 07/24/2013 1051   RDW 13.7 07/24/2013 1051   LYMPHSABS 2.2 07/24/2013 1051   MONOABS 1.3* 07/24/2013 1051   EOSABS 0.2 07/24/2013 1051   BASOSABS 0.1 07/24/2013 1051    Hgb A1C Lab Results  Component Value Date   HGBA1C 6.0 07/12/2013        Assessment & Plan:   Otalgia secondary to ETD:  Try Flonase OTC   RTC as needed or if symptoms persist or worsen

## 2013-09-11 NOTE — Progress Notes (Signed)
Pre visit review using our clinic review tool, if applicable. No additional management support is needed unless otherwise documented below in the visit note. 

## 2013-09-15 ENCOUNTER — Encounter: Payer: Self-pay | Admitting: Family Medicine

## 2013-09-20 ENCOUNTER — Ambulatory Visit (INDEPENDENT_AMBULATORY_CARE_PROVIDER_SITE_OTHER)
Admission: RE | Admit: 2013-09-20 | Discharge: 2013-09-20 | Disposition: A | Discharge status: Home / Self Care | Payer: 59 | Source: Ambulatory Visit | Attending: Family Medicine | Admitting: Family Medicine

## 2013-09-20 ENCOUNTER — Encounter: Payer: Self-pay | Admitting: Family Medicine

## 2013-09-20 ENCOUNTER — Ambulatory Visit (INDEPENDENT_AMBULATORY_CARE_PROVIDER_SITE_OTHER): Payer: 59 | Admitting: Family Medicine

## 2013-09-20 VITALS — BP 120/80 | HR 95 | Temp 98.3°F | Wt 224.5 lb

## 2013-09-20 DIAGNOSIS — M25552 Pain in left hip: Secondary | ICD-10-CM

## 2013-09-20 DIAGNOSIS — H60399 Other infective otitis externa, unspecified ear: Secondary | ICD-10-CM

## 2013-09-20 DIAGNOSIS — H9209 Otalgia, unspecified ear: Secondary | ICD-10-CM

## 2013-09-20 DIAGNOSIS — M25559 Pain in unspecified hip: Secondary | ICD-10-CM

## 2013-09-20 DIAGNOSIS — H9202 Otalgia, left ear: Secondary | ICD-10-CM

## 2013-09-20 DIAGNOSIS — R42 Dizziness and giddiness: Secondary | ICD-10-CM

## 2013-09-20 DIAGNOSIS — H6092 Unspecified otitis externa, left ear: Secondary | ICD-10-CM | POA: Insufficient documentation

## 2013-09-20 MED ORDER — CIPROFLOXACIN-DEXAMETHASONE 0.3-0.1 % OT SUSP: 4.0000 [drp] | Freq: Two times a day (BID) | OTIC | Status: DC

## 2013-09-20 MED ORDER — ZOLPIDEM TARTRATE 10 MG PO TABS: ORAL_TABLET | ORAL | Status: DC

## 2013-09-20 NOTE — Assessment & Plan Note (Signed)
New- progressive. Probable trochanteric bursitis. Cannot take NSAIDs due to h/o GIB. Will get xray and refer to Dr. Lorelei Pont for possible steroid injections. The patient indicates understanding of these issues and agrees with the plan.

## 2013-09-20 NOTE — Progress Notes (Addendum)
Subjective:   Patient ID: [name], male    DOB: Oct 23, 1970, 43 y.o.   MRN: [id number]  [name] is a pleasant 43 y.o. year old male who presents to clinic today with Otalgia, Dizziness and Hip Pain  on 09/20/2013  HPI: Left ear pain- was seen by Rollene Fare on 8/10.  Note reviewed- diagnosed with ETD.  Advised supportive care. Since then, symptoms are worsening.  Now ear is painful to touch.  No fevers.  No runny nose, sneezing, sore throat or other URI symptoms.  He does work in the heat and sweats a lot.  Left hip pain- worsening for past two months.  Radiates to thigh.  Never radiates to groin or down leg. No radiculopathy. No LE weakness. Pain worse with movement.  Current Outpatient Prescriptions on File Prior to Visit  Medication Sig Dispense Refill  . busPIRone (BUSPAR) 30 MG tablet Take 1 tablet (30 mg total) by mouth 2 (two) times daily.  180 tablet  0  . HYDROcodone-acetaminophen (NORCO/VICODIN) 5-325 MG per tablet Take 1 tablet by mouth every 6 (six) hours as needed for moderate pain.  40 tablet  0  . metFORMIN (GLUCOPHAGE) 500 MG tablet TAKE 1 TABLET BY MOUTH EVERY MORNING WITH BREAKFAST  90 tablet  0  . pantoprazole (PROTONIX) 40 MG tablet TAKE 1 TABLET BY MOUTH TWICE DAILY  180 tablet  1  . sildenafil (VIAGRA) 100 MG tablet Take 0.5-1 tablets (50-100 mg total) by mouth daily as needed for erectile dysfunction.  15 tablet  5   No current facility-administered medications on file prior to visit.    Allergies  Allergen Reactions  . Dilaudid [Hydromorphone Hcl] Rash and Other (See Comments)    Shakey  . Metoclopramide Hcl Hives, Swelling and Rash    Swelling in lips    Past Medical History  Diagnosis Date  . Upper GI bleed   . Ventral hernia   . Hiatal hernia   . History of blood clots 2012    in abdomen  . Dizziness     occasionally  . Joint pain     knees  . GERD (gastroesophageal reflux disease)     takes Protonix bid  . Gastric ulcer     history of   . Constipation     related to pain meds  . Nocturia     depends on amount of fluid he drinks  . History of blood transfusion 2012  . Anxiety     takes Xanax daily prn  . External hemorrhoid, bleeding     Past Surgical History  Procedure Laterality Date  . Abdominal surgery  10/2010    d/t bleeding ulcers; "oversewing of gastric ulcer"  . Esophagogastroduodenoscopy    . Hernia repair  09/04/11    incisional   . Incisional hernia repair  09/04/2011    Procedure: HERNIA REPAIR INCISIONAL;  Surgeon: Madilyn Hook, DO;  Location: [facility];  Service: General;  Laterality: N/A;  debridment calcified mass    Family History  Problem Relation Age of Onset  . Diabetes Mother   . Diabetes Father   . Heart disease Father     History   Social History  . Marital Status: Single    Spouse Name: N/A    Number of Children: N/A  . Years of Education: N/A   Occupational History  . Not on file.   Social History Main Topics  . Smoking status: Never Smoker   . Smokeless tobacco: Current  User    Types: Chew     Comment: occ-Chew  . Alcohol Use: Yes     Comment: 09/04/11 "very seldom"  . Drug Use: Yes  . Sexual Activity: Yes   Other Topics Concern  . Not on file   Social History Narrative  . No narrative on file   The PMH, PSH, Social History, Family History, Medications, and allergies have been reviewed in [facility], and have been updated if relevant.    Review of Systems    See HPI NO fever No n/v/d No rashes No CP or SOB Objective:    BP 120/80  Pulse 95  Temp(Src) 98.3 F (36.8 C) (Oral)  Wt 224 lb 8 oz (101.833 kg)  SpO2 97%   Physical Exam Gen:  Alert, pleasant, NAD HEENT: MMM Left ear:  Tragus tender with erythema in canal, TM normal Right ear- neg TM and canal MSK: Trochanteric bursa TTP Pain also elicited with external rotation of hip Normal gait Psych:  Good eye contact, not anxious or depressed appearing       Assessment & Plan:   Left hip pain - Plan:  DG Hip Complete Left  Dizziness  Otalgia, left  Otitis externa of left ear No Follow-up on file.   Addendum: Pt brought nephew in for [facility] and complained of worsening ear pain, now with fever.  Pt examined- TM now with serous fluid, dull. Will treat for otitis media- eRx sent for amoxicillin 875 twice daily x 10 days. Call or return to clinic prn if these symptoms worsen or fail to improve as anticipated. The patient indicates understanding of these issues and agrees with the plan.

## 2013-09-20 NOTE — Assessment & Plan Note (Signed)
New- ciprodex twice daily x 7 days. Call or return to clinic prn if these symptoms worsen or fail to improve as anticipated. The patient indicates understanding of these issues and agrees with the plan.

## 2013-09-20 NOTE — Progress Notes (Signed)
Pre visit review using our clinic review tool, if applicable. No additional management support is needed unless otherwise documented below in the visit note. 

## 2013-09-20 NOTE — Patient Instructions (Signed)
Good to see you. Use ciprodex drops twice daily for at least 7 days. Call me in a week if no improvement.  Let's get your hip xray today. We will call you with your results.

## 2013-09-21 ENCOUNTER — Other Ambulatory Visit: Payer: Self-pay | Admitting: Family Medicine

## 2013-09-21 DIAGNOSIS — M1612 Unilateral primary osteoarthritis, left hip: Secondary | ICD-10-CM

## 2013-09-25 MED ORDER — AMOXICILLIN 875 MG PO TABS: 875.0000 mg | ORAL_TABLET | Freq: Two times a day (BID) | ORAL | Status: DC

## 2013-09-25 NOTE — Addendum Note (Signed)
Addended by: [name] on: 09/25/2013 12:18 PM   Modules accepted: Orders

## 2013-10-16 ENCOUNTER — Encounter: Payer: Self-pay | Admitting: Family Medicine

## 2013-10-16 NOTE — Telephone Encounter (Signed)
Advised pt too early for request.

## 2013-10-20 ENCOUNTER — Encounter: Payer: Self-pay | Admitting: Family Medicine

## 2013-10-20 MED ORDER — ZOLPIDEM TARTRATE 10 MG PO TABS: ORAL_TABLET | ORAL | Status: DC

## 2013-10-20 NOTE — Telephone Encounter (Signed)
Pt requesting medication refill. Last f/u appt 07/2013. Ok to fill per Dr Aron. Rx to be faxed to requested pharmacy before end of day 

## 2013-11-06 ENCOUNTER — Ambulatory Visit (INDEPENDENT_AMBULATORY_CARE_PROVIDER_SITE_OTHER): Payer: 59 | Admitting: Family Medicine

## 2013-11-06 ENCOUNTER — Encounter: Payer: Self-pay | Admitting: Family Medicine

## 2013-11-06 VITALS — BP 124/82 | HR 103 | Temp 98.6°F | Wt 216.2 lb

## 2013-11-06 DIAGNOSIS — H6092 Unspecified otitis externa, left ear: Secondary | ICD-10-CM

## 2013-11-06 DIAGNOSIS — H9202 Otalgia, left ear: Secondary | ICD-10-CM

## 2013-11-06 DIAGNOSIS — M25552 Pain in left hip: Secondary | ICD-10-CM

## 2013-11-06 DIAGNOSIS — Z23 Encounter for immunization: Secondary | ICD-10-CM

## 2013-11-06 DIAGNOSIS — F419 Anxiety disorder, unspecified: Secondary | ICD-10-CM

## 2013-11-06 MED ORDER — CLONAZEPAM 0.25 MG PO TBDP: 0.2500 mg | ORAL_TABLET | Freq: Every evening | ORAL | Status: DC | PRN

## 2013-11-06 NOTE — Assessment & Plan Note (Signed)
Persistent. Canal swelling and erythema resolved- otitis externa treated. He does still have a raised area at opening of ear canal- ? Exostoses. Will refer to ENT for further evaluation. The patient indicates understanding of these issues and agrees with the plan.

## 2013-11-06 NOTE — Assessment & Plan Note (Signed)
Deteriorated. We discussed possibility of hip replacement even at his young age. He will talk to Dr. Mardelle Matte about this later this week at his already scheduled appointment.

## 2013-11-06 NOTE — Assessment & Plan Note (Signed)
Deteriorated. >25 minutes spent in face to face time with patient, >50% spent in counselling or coordination of care He does not want to go to psychotherapy at this time and is very apprehensive about trying another SSRI. Part of his anxiety, likely due to his severe hip pain. Will start low dose Klonopin as needed at bed time- discussed addictive and sedative nature of this rx.  He is aware. Continue buspar twice daily. Call or return to clinic prn if these symptoms worsen or fail to improve as anticipated. The patient indicates understanding of these issues and agrees with the plan.

## 2013-11-06 NOTE — Patient Instructions (Signed)
Great to see you. We will call you with your ENT referral.  Call me with an update about your hip, anxiety and ear.

## 2013-11-06 NOTE — Progress Notes (Signed)
Pre visit review using our clinic review tool, if applicable. No additional management support is needed unless otherwise documented below in the visit note. 

## 2013-11-06 NOTE — Progress Notes (Signed)
Subjective:   Patient ID: [name], male    DOB: 1970/03/29, 43 y.o.   MRN: [id number]  [name] is a pleasant 43 y.o. year old male who presents to clinic today with Otalgia  and some other concerns on 11/06/2013  HPI: Otalgia left ear- persistent issue. Saw him on 8/19 for symptoms consistent with otitis externa.  Treated with one week course of ciprodex and preventative precautions given.  He felt symptoms improved but still has pain and pressure inside his ear. No hearing loss. No drainage.  Anxiety- much worse lately. His relationship is going well with his girlfriend and he feels work is going well. He is constantly worried about his brother and his emotional state. Deondray is having a hard time sleeping and having some panic attacks, especially at night. He is taking Buspar 30 mg twice daily. Last year we tried zoloft which made him feel more anxious. Then, he took effexor which controlled his anxiety but caused ED and decreased sex drive. He does not want to try another SSRI. Says "I feel fine during the day." Not sleeping well. Appetite decreased- has lost some weight but he feels this is less related to anxiety and more due to his severe hip pain.  Wt Readings from Last 3 Encounters:  11/06/13 216 lb 4 oz (98.09 kg)  09/20/13 224 lb 8 oz (101.833 kg)  09/11/13 225 lb (102.059 kg)   Left hip pain-  Left hip xray here showed "severe OA." I referred him to see Dr. Mardelle Matte who performed US guided hip injection.  This unfortunately only gave him a week or so of relief.  He is in constant pain.  Sees Dr. Mardelle Matte again at the end of this week to discuss other options.  Current Outpatient Prescriptions on File Prior to Visit  Medication Sig Dispense Refill  . busPIRone (BUSPAR) 30 MG tablet Take 1 tablet (30 mg total) by mouth 2 (two) times daily.  180 tablet  0  . HYDROcodone-acetaminophen (NORCO/VICODIN) 5-325 MG per tablet Take 1 tablet by mouth every 6 (six) hours as  needed for moderate pain.  40 tablet  0  . metFORMIN (GLUCOPHAGE) 500 MG tablet TAKE 1 TABLET BY MOUTH EVERY MORNING WITH BREAKFAST  90 tablet  0  . pantoprazole (PROTONIX) 40 MG tablet TAKE 1 TABLET BY MOUTH TWICE DAILY  180 tablet  1  . sildenafil (VIAGRA) 100 MG tablet Take 0.5-1 tablets (50-100 mg total) by mouth daily as needed for erectile dysfunction.  15 tablet  5  . zolpidem (AMBIEN) 10 MG tablet TAKE 1 TABLET BY MOUTH AT BEDTIME AS NEEDED  30 tablet  0   No current facility-administered medications on file prior to visit.    Allergies  Allergen Reactions  . Dilaudid [Hydromorphone Hcl] Rash and Other (See Comments)    Shakey  . Metoclopramide Hcl Hives, Swelling and Rash    Swelling in lips    Past Medical History  Diagnosis Date  . Upper GI bleed   . Ventral hernia   . Hiatal hernia   . History of blood clots 2012    in abdomen  . Dizziness     occasionally  . Joint pain     knees  . GERD (gastroesophageal reflux disease)     takes Protonix bid  . Gastric ulcer     history of  . Constipation     related to pain meds  . Nocturia     depends on  amount of fluid he drinks  . History of blood transfusion 2012  . Anxiety     takes Xanax daily prn  . External hemorrhoid, bleeding     Past Surgical History  Procedure Laterality Date  . Abdominal surgery  10/2010    d/t bleeding ulcers; "oversewing of gastric ulcer"  . Esophagogastroduodenoscopy    . Hernia repair  09/04/11    incisional   . Incisional hernia repair  09/04/2011    Procedure: HERNIA REPAIR INCISIONAL;  Surgeon: Madilyn Hook, DO;  Location: [place] [facility];  Service: General;  Laterality: N/A;  debridment calcified mass    Family History  Problem Relation Age of Onset  . Diabetes Mother   . Diabetes Father   . Heart disease Father     History   Social History  . Marital Status: Single    Spouse Name: N/A    Number of Children: N/A  . Years of Education: N/A   Occupational History  . Not on file.     Social History Main Topics  . Smoking status: Never Smoker   . Smokeless tobacco: Current User    Types: Chew     Comment: occ-Chew  . Alcohol Use: Yes     Comment: 09/04/11 "very seldom"  . Drug Use: Yes  . Sexual Activity: Yes   Other Topics Concern  . Not on file   Social History Narrative  . No narrative on file   The PMH, PSH, Social History, Family History, Medications, and allergies have been reviewed in [facility], and have been updated if relevant.     Review of Systems    See HPI Denies feeling depression +left hip pain + insomnia +left ear pain No ear drainage No runny nose No fever No cough No CP No SOB No SI or HI Objective:    BP 124/82  Pulse 103  Temp(Src) 98.6 F (37 C) (Oral)  Wt 216 lb 4 oz (98.09 kg)  SpO2 97%   Physical Exam  Nursing note and vitals reviewed. Constitutional: He appears well-developed and well-nourished. No distress.  HENT:  Right Ear: Hearing and tympanic membrane normal. No drainage. No mastoid tenderness.  Left Ear: Hearing and tympanic membrane normal. No mastoid tenderness.  Left ear canal raised area- ?exostoses   Skin: Skin is warm, dry and intact.  Psychiatric: His speech is normal and behavior is normal. Judgment and thought content normal. His mood appears anxious. His affect is not angry. Cognition and memory are normal. He does not exhibit a depressed mood.          Assessment & Plan:   Left ear pain - Plan: Ambulatory referral to ENT  Need for influenza vaccination - Plan: Flu Vaccine QUAD 36+ mos PF IM (Fluarix Quad PF)  Left hip pain  Anxiety  Otitis externa of left ear No Follow-up on file.

## 2013-11-17 ENCOUNTER — Encounter: Payer: Self-pay | Admitting: Family Medicine

## 2013-11-17 MED ORDER — ZOLPIDEM TARTRATE 10 MG PO TABS: ORAL_TABLET | ORAL | Status: DC

## 2013-11-17 NOTE — Telephone Encounter (Signed)
Called to [facility]. 

## 2013-12-14 ENCOUNTER — Encounter: Payer: Self-pay | Admitting: Family Medicine

## 2013-12-14 MED ORDER — CLONAZEPAM 0.25 MG PO TBDP: 0.2500 mg | ORAL_TABLET | Freq: Every evening | ORAL | Status: DC | PRN

## 2013-12-14 NOTE — Telephone Encounter (Signed)
[place] said med refill call was not clear on [facility] v/m; called in clonazepam 0.25 disintegrating tab as instructed.

## 2013-12-14 NOTE — Telephone Encounter (Signed)
Pt requesting medication refill. Pts last f/u appt 07/2013, but has upcoming appt 11/16 to discuss anxiety. pls advise

## 2013-12-14 NOTE — Telephone Encounter (Signed)
Rx called in to pharmacy. 

## 2013-12-18 ENCOUNTER — Ambulatory Visit: Payer: 59 | Admitting: Family Medicine

## 2013-12-20 ENCOUNTER — Encounter: Payer: Self-pay | Admitting: Family Medicine

## 2013-12-20 ENCOUNTER — Ambulatory Visit (INDEPENDENT_AMBULATORY_CARE_PROVIDER_SITE_OTHER): Payer: 59 | Admitting: Family Medicine

## 2013-12-20 VITALS — BP 124/74 | HR 87 | Temp 97.9°F | Wt 210.0 lb

## 2013-12-20 DIAGNOSIS — F419 Anxiety disorder, unspecified: Secondary | ICD-10-CM

## 2013-12-20 MED ORDER — BUSPIRONE HCL 30 MG PO TABS: 30.0000 mg | ORAL_TABLET | Freq: Two times a day (BID) | ORAL | Status: DC

## 2013-12-20 MED ORDER — ESCITALOPRAM OXALATE 10 MG PO TABS: ORAL_TABLET | ORAL | Status: DC

## 2013-12-20 MED ORDER — ZOLPIDEM TARTRATE 10 MG PO TABS: ORAL_TABLET | ORAL | Status: DC

## 2013-12-20 NOTE — Assessment & Plan Note (Signed)
Deteriorated. >25 minutes spent in face to face time with patient, >50% spent in counselling or coordination of care Advised referral to psychiatry for psychotherapy and med management but he wants to try another rx first.  eRx sent for lexapro, continue Buspar. Did warn him that Lexapro may also cause ED. Follow up in 1 month. The patient indicates understanding of these issues and agrees with the plan.

## 2013-12-20 NOTE — Progress Notes (Signed)
Pre visit review using our clinic review tool, if applicable. No additional management support is needed unless otherwise documented below in the visit note. 

## 2013-12-20 NOTE — Progress Notes (Signed)
Subjective:   Patient ID: [name], male    DOB: 1970/03/18, 43 y.o.   MRN: [id number]  [name] is a pleasant 43 y.o. year old male who presents to clinic today with Anxiety  on 12/20/2013  HPI: He has had worsening anxiety and panic attacks since moving to this area over 43 years ago. Also has pos FH of anxiety and depression.  Started Zoloft and Ambien in October 2013.Felt Zoloft made him more anxious.  Had been well controlled on effexor but he was having erectile dysfunction and decreased sex drive. He weaned off Effexor (similar effects with Celexa) and started on Buspar.  He really liked Buspar initially, felt it was controlling his symptoms.  Taking 30 mg twice daily but has noticed worsening anxiety.  Needing to take klonopin nightly.    Under more stress lately- recently moved in with his girlfriend.  Work is stressful.    Current Outpatient Prescriptions on File Prior to Visit  Medication Sig Dispense Refill  . busPIRone (BUSPAR) 30 MG tablet Take 1 tablet (30 mg total) by mouth 2 (two) times daily. 180 tablet 0  . clonazePAM (KLONOPIN) 0.25 MG disintegrating tablet Take 1 tablet (0.25 mg total) by mouth at bedtime as needed (insomnia, panic attack). 30 tablet 0  . HYDROcodone-acetaminophen (NORCO/VICODIN) 5-325 MG per tablet Take 1 tablet by mouth every 6 (six) hours as needed for moderate pain. 40 tablet 0  . metFORMIN (GLUCOPHAGE) 500 MG tablet TAKE 1 TABLET BY MOUTH EVERY MORNING WITH BREAKFAST 90 tablet 0  . pantoprazole (PROTONIX) 40 MG tablet TAKE 1 TABLET BY MOUTH TWICE DAILY 180 tablet 1  . sildenafil (VIAGRA) 100 MG tablet Take 0.5-1 tablets (50-100 mg total) by mouth daily as needed for erectile dysfunction. 15 tablet 5  . zolpidem (AMBIEN) 10 MG tablet TAKE 1 TABLET BY MOUTH AT BEDTIME AS NEEDED 30 tablet 0   No current facility-administered medications on file prior to visit.    Allergies  Allergen Reactions  . Dilaudid [Hydromorphone Hcl] Rash and  Other (See Comments)    Shakey  . Metoclopramide Hcl Hives, Swelling and Rash    Swelling in lips    Past Medical History  Diagnosis Date  . Upper GI bleed   . Ventral hernia   . Hiatal hernia   . History of blood clots 2012    in abdomen  . Dizziness     occasionally  . Joint pain     knees  . GERD (gastroesophageal reflux disease)     takes Protonix bid  . Gastric ulcer     history of  . Constipation     related to pain meds  . Nocturia     depends on amount of fluid he drinks  . History of blood transfusion 2012  . Anxiety     takes Xanax daily prn  . External hemorrhoid, bleeding     Past Surgical History  Procedure Laterality Date  . Abdominal surgery  10/2010    d/t bleeding ulcers; "oversewing of gastric ulcer"  . Esophagogastroduodenoscopy    . Hernia repair  09/04/11    incisional   . Incisional hernia repair  09/04/2011    Procedure: HERNIA REPAIR INCISIONAL;  Surgeon: Madilyn Hook, DO;  Location: [place];  Service: General;  Laterality: N/A;  debridment calcified mass    Family History  Problem Relation Age of Onset  . Diabetes Mother   . Diabetes Father   . Heart disease Father  History   Social History  . Marital Status: Single    Spouse Name: N/A    Number of Children: N/A  . Years of Education: N/A   Occupational History  . Not on file.   Social History Main Topics  . Smoking status: Never Smoker   . Smokeless tobacco: Current User    Types: Chew     Comment: occ-Chew  . Alcohol Use: Yes     Comment: 09/04/11 "very seldom"  . Drug Use: Yes  . Sexual Activity: Yes   Other Topics Concern  . Not on file   Social History Narrative   The PMH, PSH, Social History, Family History, Medications, and allergies have been reviewed in [facility], and have been updated if relevant.   Review of Systems  Psychiatric/Behavioral: Negative for hallucinations, confusion, dysphoric mood, decreased concentration and agitation. The patient is not hyperactive.    All other systems reviewed and are negative.      Objective:    BP 124/74 mmHg  Pulse 87  Temp(Src) 97.9 F (36.6 C) (Oral)  Wt 210 lb (95.255 kg)  SpO2 98%   Physical Exam  Constitutional: He is oriented to person, place, and time. He appears well-developed and well-nourished. No distress.  HENT:  Head: Normocephalic.  Neurological: He is alert and oriented to person, place, and time.  Skin: Skin is warm and dry.  Psychiatric: He has a normal mood and affect. His behavior is normal. Judgment and thought content normal.  Nursing note and vitals reviewed.         Assessment & Plan:   Anxiety No Follow-up on file.

## 2013-12-20 NOTE — Patient Instructions (Signed)
Great to see you. We are adding Lexapro to Buspar. Please call me in a few weeks with an update.

## 2013-12-21 ENCOUNTER — Telehealth: Payer: Self-pay | Admitting: Family Medicine

## 2013-12-21 NOTE — Telephone Encounter (Signed)
emmi emailed °

## 2014-01-01 ENCOUNTER — Encounter: Payer: Self-pay | Admitting: Family Medicine

## 2014-01-01 MED ORDER — PANTOPRAZOLE SODIUM 40 MG PO TBEC: 40.0000 mg | DELAYED_RELEASE_TABLET | Freq: Two times a day (BID) | ORAL | Status: DC

## 2014-01-01 MED ORDER — SILDENAFIL CITRATE 100 MG PO TABS: 50.0000 mg | ORAL_TABLET | Freq: Every day | ORAL | Status: DC | PRN

## 2014-01-10 ENCOUNTER — Encounter: Payer: Self-pay | Admitting: Family Medicine

## 2014-01-10 MED ORDER — CLONAZEPAM 0.25 MG PO TBDP: 0.2500 mg | ORAL_TABLET | Freq: Every evening | ORAL | Status: DC | PRN

## 2014-01-10 NOTE — Telephone Encounter (Signed)
Rx called in to requested pharmacy 

## 2014-01-10 NOTE — Telephone Encounter (Signed)
Pt requesting medication refill. Last f/u appt 12/2013. pls advise 

## 2014-01-16 ENCOUNTER — Encounter: Payer: Self-pay | Admitting: Family Medicine

## 2014-01-16 MED ORDER — METFORMIN HCL 500 MG PO TABS: ORAL_TABLET | ORAL | Status: DC

## 2014-01-16 MED ORDER — ZOLPIDEM TARTRATE 10 MG PO TABS: ORAL_TABLET | ORAL | Status: DC

## 2014-01-16 NOTE — Telephone Encounter (Signed)
Pt requesting medication refill. Last f/u appt 12/2013. pls advise 

## 2014-01-16 NOTE — Telephone Encounter (Signed)
Rx called in to requested pharmacy 

## 2014-01-30 ENCOUNTER — Encounter: Payer: Self-pay | Admitting: Family Medicine

## 2014-02-08 ENCOUNTER — Encounter: Payer: Self-pay | Admitting: Family Medicine

## 2014-02-08 ENCOUNTER — Other Ambulatory Visit: Payer: Self-pay | Admitting: Family Medicine

## 2014-02-08 MED ORDER — CLONAZEPAM 0.25 MG PO TBDP: 0.2500 mg | ORAL_TABLET | Freq: Every evening | ORAL | Status: DC | PRN

## 2014-02-08 NOTE — Telephone Encounter (Signed)
Rx called in to requested pharmacy 

## 2014-02-08 NOTE — Telephone Encounter (Signed)
Pt requesting medication refill. Last f/u appt 12/2013. pls advise 

## 2014-02-15 ENCOUNTER — Encounter: Payer: Self-pay | Admitting: Family Medicine

## 2014-02-15 ENCOUNTER — Encounter: Payer: Self-pay | Admitting: Internal Medicine

## 2014-02-15 ENCOUNTER — Ambulatory Visit (INDEPENDENT_AMBULATORY_CARE_PROVIDER_SITE_OTHER): Payer: 59 | Admitting: Internal Medicine

## 2014-02-15 VITALS — BP 126/84 | HR 106 | Temp 97.8°F | Wt 205.0 lb

## 2014-02-15 DIAGNOSIS — J029 Acute pharyngitis, unspecified: Secondary | ICD-10-CM

## 2014-02-15 DIAGNOSIS — J069 Acute upper respiratory infection, unspecified: Secondary | ICD-10-CM

## 2014-02-15 LAB — POCT RAPID STREP A (OFFICE): Rapid Strep A Screen: NEGATIVE

## 2014-02-15 MED ORDER — METHYLPREDNISOLONE ACETATE 80 MG/ML IJ SUSP
80.0000 mg | Freq: Once | INTRAMUSCULAR | Status: AC
  Administered 2014-02-15: 80 mg via INTRAMUSCULAR

## 2014-02-15 NOTE — Telephone Encounter (Signed)
Pt requesting medication refill. Last f/u appt 12/2013. Pt was seen in office for acute appt today and informed he will need f/u OV for additional refills. pls advise

## 2014-02-15 NOTE — Progress Notes (Signed)
Pre visit review using our clinic review tool, if applicable. No additional management support is needed unless otherwise documented below in the visit note. 

## 2014-02-15 NOTE — Addendum Note (Signed)
Addended by: Lurlean Nanny on: 02/15/2014 02:37 PM   Modules accepted: Orders

## 2014-02-15 NOTE — Progress Notes (Signed)
HPI  Pt presents to the clinic today with c/o sore throat and ear pain. He reports this started 2 days ago. He has had some associated difficulty swallowing, runny nose and cough. He denies fever, chills or body aches. He has not tried anything OTC. He has had sick contacts. He does not smoke. He has had a flu shot.  Review of Systems      Past Medical History  Diagnosis Date  . Upper GI bleed   . Ventral hernia   . Hiatal hernia   . History of blood clots 2012    in abdomen  . Dizziness     occasionally  . Joint pain     knees  . GERD (gastroesophageal reflux disease)     takes Protonix bid  . Gastric ulcer     history of  . Constipation     related to pain meds  . Nocturia     depends on amount of fluid he drinks  . History of blood transfusion 2012  . Anxiety     takes Xanax daily prn  . External hemorrhoid, bleeding     Family History  Problem Relation Age of Onset  . Diabetes Mother   . Diabetes Father   . Heart disease Father     History   Social History  . Marital Status: Single    Spouse Name: N/A    Number of Children: N/A  . Years of Education: N/A   Occupational History  . Not on file.   Social History Main Topics  . Smoking status: Never Smoker   . Smokeless tobacco: Current User    Types: Chew     Comment: occ-Chew  . Alcohol Use: 0.0 oz/week    0 Not specified per week     Comment: 09/04/11 "very seldom"  . Drug Use: Yes  . Sexual Activity: Yes   Other Topics Concern  . Not on file   Social History Narrative    Allergies  Allergen Reactions  . Dilaudid [Hydromorphone Hcl] Rash and Other (See Comments)    Shakey  . Metoclopramide Hcl Hives, Swelling and Rash    Swelling in lips     Constitutional:  Denies headache, fatigue, fever or abrupt weight changes.  HEENT:  Positive runny nose, sore throat. Denies eye redness, eye pain, pressure behind the eyes, facial pain, nasal congestion, ear pain, ringing in the ears, wax buildup, or  bloody nose. Respiratory: Positive cough. Denies difficulty breathing or shortness of breath.  Cardiovascular: Denies chest pain, chest tightness, palpitations or swelling in the hands or feet.   No other specific complaints in a complete review of systems (except as listed in HPI above).  Objective:   BP 126/84 mmHg  Pulse 106  Temp(Src) 97.8 F (36.6 C) (Oral)  Wt 205 lb (92.987 kg)  SpO2 98% Wt Readings from Last 3 Encounters:  02/15/14 205 lb (92.987 kg)  12/20/13 210 lb (95.255 kg)  11/06/13 216 lb 4 oz (98.09 kg)     General: Appears his stated age, well developed, well nourished in NAD. HEENT: Head: normal shape and size, no sinus tenderness; Eyes: sclera white, no icterus, conjunctiva pink; Ears: Tm's pink but intact, normal light reflex; Nose: mucosa pink and moist, septum midline; Throat/Mouth: + PND. Teeth present, mucosa erythematous and moist, no exudate noted, no lesions or ulcerations noted.  Neck: No lymphadenopathy noted. Cardiovascular: Normal rate and rhythm. S1,S2 noted.  No murmur, rubs or gallops noted.  Pulmonary/Chest: Normal  effort and positive vesicular breath sounds. No respiratory distress. No wheezes, rales or ronchi noted.      Assessment & Plan:   Viral URI:  Get some rest and drink plenty of water Do salt water gargles for the sore throat 80 mg Depo IM today Delsym OTC for cough  RTC as needed or if symptoms persist.

## 2014-02-15 NOTE — Patient Instructions (Signed)
Upper Respiratory Infection, Adult An upper respiratory infection (URI) is also sometimes known as the common cold. The upper respiratory tract includes the nose, sinuses, throat, trachea, and bronchi. Bronchi are the airways leading to the lungs. Most people improve within 1 week, but symptoms can last up to 2 weeks. A residual cough may last even longer.  CAUSES Many different viruses can infect the tissues lining the upper respiratory tract. The tissues become irritated and inflamed and often become very moist. Mucus production is also common. A cold is contagious. You can easily spread the virus to others by oral contact. This includes kissing, sharing a glass, coughing, or sneezing. Touching your mouth or nose and then touching a surface, which is then touched by another person, can also spread the virus. SYMPTOMS  Symptoms typically develop 1 to 3 days after you come in contact with a cold virus. Symptoms vary from person to person. They may include:  Runny nose.  Sneezing.  Nasal congestion.  Sinus irritation.  Sore throat.  Loss of voice (laryngitis).  Cough.  Fatigue.  Muscle aches.  Loss of appetite.  Headache.  Low-grade fever. DIAGNOSIS  You might diagnose your own cold based on familiar symptoms, since most people get a cold 2 to 3 times a year. Your caregiver can confirm this based on your exam. Most importantly, your caregiver can check that your symptoms are not due to another disease such as strep throat, sinusitis, pneumonia, asthma, or epiglottitis. Blood tests, throat tests, and X-rays are not necessary to diagnose a common cold, but they may sometimes be helpful in excluding other more serious diseases. Your caregiver will decide if any further tests are required. RISKS AND COMPLICATIONS  You may be at risk for a more severe case of the common cold if you smoke cigarettes, have chronic heart disease (such as heart failure) or lung disease (such as asthma), or if  you have a weakened immune system. The very young and very old are also at risk for more serious infections. Bacterial sinusitis, middle ear infections, and bacterial pneumonia can complicate the common cold. The common cold can worsen asthma and chronic obstructive pulmonary disease (COPD). Sometimes, these complications can require emergency medical care and may be life-threatening. PREVENTION  The best way to protect against getting a cold is to practice good hygiene. Avoid oral or hand contact with people with cold symptoms. Wash your hands often if contact occurs. There is no clear evidence that vitamin C, vitamin E, echinacea, or exercise reduces the chance of developing a cold. However, it is always recommended to get plenty of rest and practice good nutrition. TREATMENT  Treatment is directed at relieving symptoms. There is no cure. Antibiotics are not effective, because the infection is caused by a virus, not by bacteria. Treatment may include:  Increased fluid intake. Sports drinks offer valuable electrolytes, sugars, and fluids.  Breathing heated mist or steam (vaporizer or shower).  Eating chicken soup or other clear broths, and maintaining good nutrition.  Getting plenty of rest.  Using gargles or lozenges for comfort.  Controlling fevers with ibuprofen or acetaminophen as directed by your caregiver.  Increasing usage of your inhaler if you have asthma. Zinc gel and zinc lozenges, taken in the first 24 hours of the common cold, can shorten the duration and lessen the severity of symptoms. Pain medicines may help with fever, muscle aches, and throat pain. A variety of non-prescription medicines are available to treat congestion and runny nose. Your caregiver   can make recommendations and may suggest nasal or lung inhalers for other symptoms.  HOME CARE INSTRUCTIONS   Only take over-the-counter or prescription medicines for pain, discomfort, or fever as directed by your  caregiver.  Use a warm mist humidifier or inhale steam from a shower to increase air moisture. This may keep secretions moist and make it easier to breathe.  Drink enough water and fluids to keep your urine clear or pale yellow.  Rest as needed.  Return to work when your temperature has returned to normal or as your caregiver advises. You may need to stay home longer to avoid infecting others. You can also use a face mask and careful hand washing to prevent spread of the virus. SEEK MEDICAL CARE IF:   After the first few days, you feel you are getting worse rather than better.  You need your caregiver's advice about medicines to control symptoms.  You develop chills, worsening shortness of breath, or brown or red sputum. These may be signs of pneumonia.  You develop yellow or brown nasal discharge or pain in the face, especially when you bend forward. These may be signs of sinusitis.  You develop a fever, swollen neck glands, pain with swallowing, or white areas in the back of your throat. These may be signs of strep throat. SEEK IMMEDIATE MEDICAL CARE IF:   You have a fever.  You develop severe or persistent headache, ear pain, sinus pain, or chest pain.  You develop wheezing, a prolonged cough, cough up blood, or have a change in your usual mucus (if you have chronic lung disease).  You develop sore muscles or a stiff neck. Document Released: 07/15/2000 Document Revised: 04/13/2011 Document Reviewed: 04/26/2013 Exit[employer] Patient Information 2015 [employer]. This information is not intended to replace advice given to you by your health care provider. Make sure you discuss any questions you have with your health care provider.  

## 2014-02-16 MED ORDER — ZOLPIDEM TARTRATE 10 MG PO TABS: ORAL_TABLET | ORAL | Status: DC

## 2014-02-16 NOTE — Telephone Encounter (Signed)
Rx called in to requested pharmacy 

## 2014-03-07 ENCOUNTER — Telehealth: Payer: Self-pay | Admitting: *Deleted

## 2014-03-07 NOTE — Telephone Encounter (Signed)
Fax received from [facility] indicating pt needing surgical clearance for total L knee replacement. Per Dr Deborra Medina, pt needing OV. Spoke to pt and scheduled pre-op clearance appt. Hip surgery scheduled for 04/24/14 per pt

## 2014-03-09 ENCOUNTER — Encounter: Payer: Self-pay | Admitting: Family Medicine

## 2014-03-14 ENCOUNTER — Encounter: Payer: Self-pay | Admitting: Family Medicine

## 2014-03-14 MED ORDER — CLONAZEPAM 0.25 MG PO TBDP: 0.2500 mg | ORAL_TABLET | Freq: Every evening | ORAL | Status: DC | PRN

## 2014-03-14 NOTE — Telephone Encounter (Signed)
Rx called in to requested pharmacy 

## 2014-03-14 NOTE — Telephone Encounter (Signed)
Last f/u appt 12/2013 

## 2014-03-19 ENCOUNTER — Other Ambulatory Visit: Payer: Self-pay | Admitting: Family Medicine

## 2014-03-19 ENCOUNTER — Encounter: Payer: Self-pay | Admitting: Family Medicine

## 2014-03-20 ENCOUNTER — Other Ambulatory Visit: Payer: Self-pay

## 2014-03-20 NOTE — Telephone Encounter (Signed)
Rx called in to pharmacy. 

## 2014-03-23 ENCOUNTER — Other Ambulatory Visit: Payer: Self-pay | Admitting: Orthopedic Surgery

## 2014-03-26 ENCOUNTER — Encounter: Payer: Self-pay | Admitting: Family Medicine

## 2014-03-26 ENCOUNTER — Ambulatory Visit (INDEPENDENT_AMBULATORY_CARE_PROVIDER_SITE_OTHER): Payer: 59 | Admitting: Family Medicine

## 2014-03-26 VITALS — BP 134/82 | HR 92 | Temp 98.0°F | Wt 205.2 lb

## 2014-03-26 DIAGNOSIS — Z01810 Encounter for preprocedural cardiovascular examination: Secondary | ICD-10-CM

## 2014-03-26 DIAGNOSIS — Z01818 Encounter for other preprocedural examination: Secondary | ICD-10-CM

## 2014-03-26 MED ORDER — BUSPIRONE HCL 30 MG PO TABS: 30.0000 mg | ORAL_TABLET | Freq: Two times a day (BID) | ORAL | Status: DC

## 2014-03-26 NOTE — Progress Notes (Signed)
Pre visit review using our clinic review tool, if applicable. No additional management support is needed unless otherwise documented below in the visit note. 

## 2014-03-26 NOTE — Progress Notes (Signed)
Subjective:    [name] is a 44 y.o. male who presents to the office today for a preoperative consultation at the request of surgeon Dr. Mardelle Matte who plans on performing hip replacement on February 22.  Planned anesthesia: general. The patient has the following known anesthesia issues: none.. Patients bleeding risk: no recent abnormal bleeding.   Current Outpatient Prescriptions on File Prior to Visit  Medication Sig Dispense Refill  . clonazePAM (KLONOPIN) 0.25 MG disintegrating tablet Take 1 tablet (0.25 mg total) by mouth at bedtime as needed (insomnia, panic attack). 30 tablet 0  . HYDROcodone-acetaminophen (NORCO/VICODIN) 5-325 MG per tablet Take 1 tablet by mouth every 6 (six) hours as needed for moderate pain. 40 tablet 0  . metFORMIN (GLUCOPHAGE) 500 MG tablet TAKE 1 TABLET BY MOUTH EVERY MORNING WITH BREAKFAST 90 tablet 0  . pantoprazole (PROTONIX) 40 MG tablet Take 1 tablet (40 mg total) by mouth 2 (two) times daily. 180 tablet 1  . sildenafil (VIAGRA) 100 MG tablet Take 0.5-1 tablets (50-100 mg total) by mouth daily as needed for erectile dysfunction. 15 tablet 5  . zolpidem (AMBIEN) 10 MG tablet TAKE 1 TABLET BY MOUTH AT BEDTIME AS NEEDED. PATIENT NEEDS OFFICE VISIT 30 tablet 0   No current facility-administered medications on file prior to visit.    Allergies  Allergen Reactions  . Dilaudid [Hydromorphone Hcl] Rash and Other (See Comments)    Shakey  . Metoclopramide Hcl Hives, Swelling and Rash    Swelling in lips    Past Medical History  Diagnosis Date  . Upper GI bleed   . Ventral hernia   . Hiatal hernia   . History of blood clots 2012    in abdomen  . Dizziness     occasionally  . Joint pain     knees  . GERD (gastroesophageal reflux disease)     takes Protonix bid  . Gastric ulcer     history of  . Constipation     related to pain meds  . Nocturia     depends on amount of fluid he drinks  . History of blood transfusion 2012  . Anxiety     takes  Xanax daily prn  . External hemorrhoid, bleeding     Past Surgical History  Procedure Laterality Date  . Abdominal surgery  10/2010    d/t bleeding ulcers; "oversewing of gastric ulcer"  . Esophagogastroduodenoscopy    . Hernia repair  09/04/11    incisional   . Incisional hernia repair  09/04/2011    Procedure: HERNIA REPAIR INCISIONAL;  Surgeon: Madilyn Hook, DO;  Location: [place];  Service: General;  Laterality: N/A;  debridment calcified mass    Family History  Problem Relation Age of Onset  . Diabetes Mother   . Diabetes Father   . Heart disease Father     History   Social History  . Marital Status: Single    Spouse Name: N/A  . Number of Children: N/A  . Years of Education: N/A   Occupational History  . Not on file.   Social History Main Topics  . Smoking status: Never Smoker   . Smokeless tobacco: Current User    Types: Chew     Comment: occ-Chew  . Alcohol Use: 0.0 oz/week    0 Standard drinks or equivalent per week     Comment: 09/04/11 "very seldom"  . Drug Use: Yes  . Sexual Activity: Yes   Other Topics Concern  . Not on  file   Social History Narrative   The PMH, PSH, Social History, Family History, Medications, and allergies have been reviewed in [facility], and have been updated if relevant.   Review of Systems  See HPI No CP, SOB or DOE +intermittent anxiety- unchanged No cough No blurred vision +severe left hip pain  Objective:   General:  overweght male in NAD Eyes:  PERRL Ears:  External ear exam shows no significant lesions or deformities.  Otoscopic examination reveals clear canals, tympanic membranes are intact bilaterally without bulging, retraction, inflammation or discharge. Hearing is grossly normal bilaterally. Nose:  External nasal examination shows no deformity or inflammation. Nasal mucosa are pink and moist without lesions or exudates. Mouth:  Oral mucosa and oropharynx without lesions or exudates.  Teeth in good repair. Neck:  no  carotid bruit or thyromegaly no cervical or supraclavicular lymphadenopathy  Lungs:  Normal respiratory effort, chest expands symmetrically. Lungs are clear to auscultation, no crackles or wheezes. Heart:  Normal rate and regular rhythm. S1 and S2 normal without gallop, murmur, click, rub or other extra sounds. Extremities:  no edema    ble"}    Assessment:      44 y.o. male with planned surgery as above.   Known risk factors for perioperative complications: None   Difficulty with intubation is not anticipated.  Cardiac Risk Estimation: *low    Plan:    1. Preoperative workup as follows ECG- appears unchanged from prior. Cleared for surgery from medical standpoint.  If concern about abnormal EKG, needs to be cleared by cardiology although EKG appears unchanged from prior in chart.

## 2014-04-03 NOTE — Pre-Procedure Instructions (Addendum)
[name]  04/03/2014   Your procedure is scheduled on:  04/24/14  Report to [facility] short stay admitting at 40 AM.  Call this number if you have problems the morning of surgery: [phone]   Remember:   Do not eat food or drink liquids after midnight.   Take these medicines the morning of surgery with A SIP OF WATER: buspar, protonix, pain med if needed    STOP all herbel meds, nsaids (aleve,naproxen,advil,ibuprofen) 5 days prior to surgery starting 04/19/14   Do not wear jewelry, make-up or nail polish.  Do not wear lotions, powders, or perfumes. You may wear deodorant.  Do not shave 48 hours prior to surgery. Men may shave face and neck.  Do not bring valuables to the hospital.  [facility] is not responsible for any belongings or valuables.               Contacts, dentures or bridgework may not be worn into surgery.  Leave suitcase in the car. After surgery it may be brought to your room.  For patients admitted to the hospital, discharge time is determined by your   treatment team.               Patients discharged the day of surgery will not be allowed to drive home.  Name and phone number of your driver: family/friend     Please read over the following fact sheets that you were given: Pain Booklet, Coughing and Deep Breathing, Total Joint Packet, MRSA Information and Surgical Site Infection Prevention

## 2014-04-04 ENCOUNTER — Encounter (HOSPITAL_COMMUNITY): Payer: Self-pay

## 2014-04-04 ENCOUNTER — Encounter (HOSPITAL_COMMUNITY)
Admission: RE | Admit: 2014-04-04 | Discharge: 2014-04-04 | Disposition: A | Discharge status: Home / Self Care | Payer: 59 | Source: Ambulatory Visit | Attending: Orthopedic Surgery | Admitting: Orthopedic Surgery

## 2014-04-04 DIAGNOSIS — M1612 Unilateral primary osteoarthritis, left hip: Secondary | ICD-10-CM | POA: Diagnosis not present

## 2014-04-04 DIAGNOSIS — Z01812 Encounter for preprocedural laboratory examination: Secondary | ICD-10-CM | POA: Insufficient documentation

## 2014-04-04 DIAGNOSIS — Z01818 Encounter for other preprocedural examination: Secondary | ICD-10-CM | POA: Insufficient documentation

## 2014-04-04 HISTORY — DX: Type 2 diabetes mellitus without complications: E11.9

## 2014-04-04 LAB — SURGICAL PCR SCREEN
MRSA, PCR: NEGATIVE
Staphylococcus aureus: NEGATIVE

## 2014-04-04 NOTE — Progress Notes (Signed)
This patient scored at an elevated risk for obstructive sleep apnea using the STOP BANG TOOL during a pre surgical testing 

## 2014-04-11 ENCOUNTER — Encounter: Payer: Self-pay | Admitting: Family Medicine

## 2014-04-11 MED ORDER — CLONAZEPAM 0.25 MG PO TBDP: 0.2500 mg | ORAL_TABLET | Freq: Every evening | ORAL | Status: DC | PRN

## 2014-04-11 NOTE — Telephone Encounter (Signed)
Rx called in to requested pharmacy 

## 2014-04-11 NOTE — Telephone Encounter (Signed)
Last f/u appt 12/2013 

## 2014-04-12 ENCOUNTER — Encounter (HOSPITAL_COMMUNITY)
Admission: RE | Admit: 2014-04-12 | Discharge: 2014-04-12 | Disposition: A | Discharge status: Home / Self Care | Payer: 59 | Source: Ambulatory Visit | Attending: Orthopedic Surgery | Admitting: Orthopedic Surgery

## 2014-04-12 LAB — CBC
HCT: 44.4 % (ref 39.0–52.0)
Hemoglobin: 15.5 g/dL (ref 13.0–17.0)
MCH: 31.8 pg (ref 26.0–34.0)
MCHC: 34.9 g/dL (ref 30.0–36.0)
MCV: 91.2 fL (ref 78.0–100.0)
Platelets: 312 10*3/uL (ref 150–400)
RBC: 4.87 MIL/uL (ref 4.22–5.81)
RDW: 13.6 % (ref 11.5–15.5)
WBC: 8 10*3/uL (ref 4.0–10.5)

## 2014-04-12 LAB — BASIC METABOLIC PANEL
Anion gap: 6 (ref 5–15)
BUN: 13 mg/dL (ref 6–23)
CO2: 31 mmol/L (ref 19–32)
Calcium: 9.9 mg/dL (ref 8.4–10.5)
Chloride: 104 mmol/L (ref 96–112)
Creatinine, Ser: 1.17 mg/dL (ref 0.50–1.35)
GFR calc Af Amer: 87 mL/min — ABNORMAL LOW (ref >90)
GFR calc non Af Amer: 75 mL/min — ABNORMAL LOW (ref >90)
GLUCOSE: 109 mg/dL — AB (ref 70–99)
Potassium: 4.8 mmol/L (ref 3.5–5.1)
Sodium: 141 mmol/L (ref 135–145)

## 2014-04-12 LAB — APTT: aPTT: 28 seconds (ref 24–37)

## 2014-04-12 LAB — PROTIME-INR
INR: 0.96 (ref 0.00–1.49)
PROTHROMBIN TIME: 12.9 s (ref 11.6–15.2)

## 2014-04-17 ENCOUNTER — Encounter: Payer: Self-pay | Admitting: Family Medicine

## 2014-04-17 MED ORDER — ZOLPIDEM TARTRATE 10 MG PO TABS: ORAL_TABLET | ORAL | Status: DC

## 2014-04-17 NOTE — Telephone Encounter (Signed)
Rx called in to requested pharmacy; pt advised via mychart

## 2014-04-23 MED ORDER — CEFAZOLIN SODIUM-DEXTROSE 2-3 GM-% IV SOLR
2.0000 g | INTRAVENOUS | Status: AC
  Administered 2014-04-24: 2 g via INTRAVENOUS
  Filled 2014-04-23: qty 50

## 2014-04-24 ENCOUNTER — Inpatient Hospital Stay (HOSPITAL_COMMUNITY)
Admission: RE | Admit: 2014-04-24 | Discharge: 2014-04-27 | DRG: 470 | Disposition: A | Discharge status: Home Health | Payer: 59 | Source: Ambulatory Visit | Attending: Orthopedic Surgery | Admitting: Orthopedic Surgery

## 2014-04-24 ENCOUNTER — Encounter (HOSPITAL_COMMUNITY)
Admission: RE | Disposition: A | Discharge status: Home Health | Payer: Self-pay | Source: Ambulatory Visit | Attending: Orthopedic Surgery

## 2014-04-24 ENCOUNTER — Inpatient Hospital Stay (HOSPITAL_COMMUNITY): Payer: 59

## 2014-04-24 ENCOUNTER — Inpatient Hospital Stay (HOSPITAL_COMMUNITY): Payer: 59 | Admitting: Certified Registered Nurse Anesthetist

## 2014-04-24 ENCOUNTER — Encounter (HOSPITAL_COMMUNITY): Payer: Self-pay | Admitting: *Deleted

## 2014-04-24 DIAGNOSIS — F419 Anxiety disorder, unspecified: Secondary | ICD-10-CM | POA: Diagnosis present

## 2014-04-24 DIAGNOSIS — K219 Gastro-esophageal reflux disease without esophagitis: Secondary | ICD-10-CM | POA: Diagnosis present

## 2014-04-24 DIAGNOSIS — Z79899 Other long term (current) drug therapy: Secondary | ICD-10-CM

## 2014-04-24 DIAGNOSIS — E119 Type 2 diabetes mellitus without complications: Secondary | ICD-10-CM | POA: Diagnosis present

## 2014-04-24 DIAGNOSIS — M1610 Unilateral primary osteoarthritis, unspecified hip: Secondary | ICD-10-CM

## 2014-04-24 DIAGNOSIS — M1612 Unilateral primary osteoarthritis, left hip: Principal | ICD-10-CM | POA: Diagnosis present

## 2014-04-24 DIAGNOSIS — Z7982 Long term (current) use of aspirin: Secondary | ICD-10-CM

## 2014-04-24 HISTORY — DX: Unilateral primary osteoarthritis, left hip: M16.12

## 2014-04-24 HISTORY — PX: TOTAL HIP ARTHROPLASTY: SHX124

## 2014-04-24 LAB — GLUCOSE, CAPILLARY
GLUCOSE-CAPILLARY: 103 mg/dL — AB (ref 70–99)
GLUCOSE-CAPILLARY: 113 mg/dL — AB (ref 70–99)
Glucose-Capillary: 107 mg/dL — ABNORMAL HIGH (ref 70–99)
Glucose-Capillary: 109 mg/dL — ABNORMAL HIGH (ref 70–99)

## 2014-04-24 SURGERY — ARTHROPLASTY, HIP, TOTAL,POSTERIOR APPROACH
Anesthesia: General | Site: Hip | Laterality: Left

## 2014-04-24 MED ORDER — MIDAZOLAM HCL 2 MG/2ML IJ SOLN
INTRAMUSCULAR | Status: AC
  Filled 2014-04-24: qty 2

## 2014-04-24 MED ORDER — MORPHINE SULFATE 2 MG/ML IJ SOLN
INTRAMUSCULAR | Status: AC
  Filled 2014-04-24: qty 1

## 2014-04-24 MED ORDER — MAGNESIUM CITRATE PO SOLN: 1.0000 | Freq: Once | ORAL | Status: AC | PRN

## 2014-04-24 MED ORDER — LACTATED RINGERS IV SOLN
INTRAVENOUS | Status: DC
  Administered 2014-04-24: 08:00:00 via INTRAVENOUS

## 2014-04-24 MED ORDER — FENTANYL CITRATE 0.05 MG/ML IJ SOLN
INTRAMUSCULAR | Status: AC
  Filled 2014-04-24: qty 5

## 2014-04-24 MED ORDER — GLYCOPYRROLATE 0.2 MG/ML IJ SOLN
INTRAMUSCULAR | Status: DC | PRN
  Administered 2014-04-24: 0.4 mg via INTRAVENOUS

## 2014-04-24 MED ORDER — MIDAZOLAM HCL 5 MG/5ML IJ SOLN
INTRAMUSCULAR | Status: DC | PRN
  Administered 2014-04-24: 2 mg via INTRAVENOUS

## 2014-04-24 MED ORDER — ONDANSETRON HCL 4 MG/2ML IJ SOLN
INTRAMUSCULAR | Status: DC | PRN
  Administered 2014-04-24 (×2): 4 mg via INTRAVENOUS

## 2014-04-24 MED ORDER — PROPOFOL 10 MG/ML IV BOLUS
INTRAVENOUS | Status: AC
  Filled 2014-04-24: qty 20

## 2014-04-24 MED ORDER — LIDOCAINE HCL (CARDIAC) 20 MG/ML IV SOLN
INTRAVENOUS | Status: DC | PRN
  Administered 2014-04-24: 80 mg via INTRAVENOUS

## 2014-04-24 MED ORDER — MORPHINE SULFATE 2 MG/ML IJ SOLN
INTRAMUSCULAR | Status: AC
  Administered 2014-04-24: 2 mg via INTRAVENOUS
  Filled 2014-04-24: qty 1

## 2014-04-24 MED ORDER — MORPHINE SULFATE 2 MG/ML IJ SOLN
1.0000 mg | INTRAMUSCULAR | Status: DC | PRN
  Administered 2014-04-24 (×6): 2 mg via INTRAVENOUS

## 2014-04-24 MED ORDER — BACLOFEN 10 MG PO TABS: 10.0000 mg | ORAL_TABLET | Freq: Three times a day (TID) | ORAL | Status: DC

## 2014-04-24 MED ORDER — SENNA-DOCUSATE SODIUM 8.6-50 MG PO TABS: 2.0000 | ORAL_TABLET | Freq: Every day | ORAL | Status: DC

## 2014-04-24 MED ORDER — OXYCODONE-ACETAMINOPHEN 10-325 MG PO TABS: 1.0000 | ORAL_TABLET | Freq: Four times a day (QID) | ORAL | Status: DC | PRN

## 2014-04-24 MED ORDER — ACETAMINOPHEN 325 MG PO TABS: 650.0000 mg | ORAL_TABLET | Freq: Four times a day (QID) | ORAL | Status: DC | PRN

## 2014-04-24 MED ORDER — MORPHINE SULFATE 2 MG/ML IJ SOLN
2.0000 mg | INTRAMUSCULAR | Status: DC | PRN
  Administered 2014-04-24 – 2014-04-27 (×2): 2 mg via INTRAVENOUS
  Filled 2014-04-24 (×3): qty 1

## 2014-04-24 MED ORDER — DEXAMETHASONE SODIUM PHOSPHATE 4 MG/ML IJ SOLN
INTRAMUSCULAR | Status: DC | PRN
  Administered 2014-04-24: 4 mg via INTRAVENOUS

## 2014-04-24 MED ORDER — NEOSTIGMINE METHYLSULFATE 10 MG/10ML IV SOLN
INTRAVENOUS | Status: DC | PRN
  Administered 2014-04-24: 3 mg via INTRAVENOUS

## 2014-04-24 MED ORDER — BUPIVACAINE HCL (PF) 0.25 % IJ SOLN
INTRAMUSCULAR | Status: DC | PRN
  Administered 2014-04-24: 20 mL

## 2014-04-24 MED ORDER — MORPHINE SULFATE 4 MG/ML IJ SOLN
INTRAMUSCULAR | Status: AC
  Filled 2014-04-24: qty 1

## 2014-04-24 MED ORDER — ZOLPIDEM TARTRATE 5 MG PO TABS
10.0000 mg | ORAL_TABLET | Freq: Every evening | ORAL | Status: DC | PRN
  Administered 2014-04-24 – 2014-04-26 (×3): 10 mg via ORAL
  Filled 2014-04-24 (×3): qty 2

## 2014-04-24 MED ORDER — PROMETHAZINE HCL 25 MG/ML IJ SOLN: 6.2500 mg | INTRAMUSCULAR | Status: DC | PRN

## 2014-04-24 MED ORDER — FENTANYL CITRATE 0.05 MG/ML IJ SOLN
INTRAMUSCULAR | Status: DC | PRN
  Administered 2014-04-24 (×3): 50 ug via INTRAVENOUS
  Administered 2014-04-24: 100 ug via INTRAVENOUS
  Administered 2014-04-24 (×5): 50 ug via INTRAVENOUS

## 2014-04-24 MED ORDER — PROPOFOL 10 MG/ML IV BOLUS
INTRAVENOUS | Status: DC | PRN
  Administered 2014-04-24: 200 mg via INTRAVENOUS

## 2014-04-24 MED ORDER — OXYCODONE HCL 5 MG PO TABS
ORAL_TABLET | ORAL | Status: AC
  Filled 2014-04-24: qty 1

## 2014-04-24 MED ORDER — ALUM & MAG HYDROXIDE-SIMETH 200-200-20 MG/5ML PO SUSP
30.0000 mL | ORAL | Status: DC | PRN
  Administered 2014-04-24 – 2014-04-26 (×3): 30 mL via ORAL
  Filled 2014-04-24 (×3): qty 30

## 2014-04-24 MED ORDER — METHOCARBAMOL 1000 MG/10ML IJ SOLN
500.0000 mg | Freq: Four times a day (QID) | INTRAVENOUS | Status: DC | PRN
  Administered 2014-04-24: 500 mg via INTRAVENOUS
  Filled 2014-04-24 (×2): qty 5

## 2014-04-24 MED ORDER — POLYETHYLENE GLYCOL 3350 17 G PO PACK
17.0000 g | PACK | Freq: Every day | ORAL | Status: DC | PRN
Start: 2014-04-24 — End: 2014-04-27

## 2014-04-24 MED ORDER — ROCURONIUM BROMIDE 100 MG/10ML IV SOLN
INTRAVENOUS | Status: DC | PRN
  Administered 2014-04-24: 50 mg via INTRAVENOUS

## 2014-04-24 MED ORDER — ONDANSETRON HCL 4 MG PO TABS
4.0000 mg | ORAL_TABLET | Freq: Four times a day (QID) | ORAL | Status: DC | PRN
  Administered 2014-04-26: 4 mg via ORAL
  Filled 2014-04-24: qty 1

## 2014-04-24 MED ORDER — OXYCODONE HCL 5 MG PO TABS
5.0000 mg | ORAL_TABLET | ORAL | Status: DC | PRN
  Administered 2014-04-24: 5 mg via ORAL
  Administered 2014-04-24: 10 mg via ORAL
  Administered 2014-04-24: 5 mg via ORAL
  Administered 2014-04-25 – 2014-04-27 (×17): 10 mg via ORAL
  Filled 2014-04-24 (×19): qty 2

## 2014-04-24 MED ORDER — ACETAMINOPHEN 650 MG RE SUPP: 650.0000 mg | Freq: Four times a day (QID) | RECTAL | Status: DC | PRN

## 2014-04-24 MED ORDER — PHENYLEPHRINE HCL 10 MG/ML IJ SOLN
INTRAMUSCULAR | Status: DC | PRN
  Administered 2014-04-24 (×2): 80 ug via INTRAVENOUS
  Administered 2014-04-24: 120 ug via INTRAVENOUS
  Administered 2014-04-24: 80 ug via INTRAVENOUS
  Administered 2014-04-24: 120 ug via INTRAVENOUS
  Administered 2014-04-24: 40 ug via INTRAVENOUS
  Administered 2014-04-24: 80 ug via INTRAVENOUS
  Administered 2014-04-24 (×3): 120 ug via INTRAVENOUS
  Administered 2014-04-24: 80 ug via INTRAVENOUS
  Administered 2014-04-24: 160 ug via INTRAVENOUS
  Administered 2014-04-24: 40 ug via INTRAVENOUS
  Administered 2014-04-24: 120 ug via INTRAVENOUS
  Administered 2014-04-24 (×3): 80 ug via INTRAVENOUS

## 2014-04-24 MED ORDER — BISACODYL 10 MG RE SUPP: 10.0000 mg | Freq: Every day | RECTAL | Status: DC | PRN

## 2014-04-24 MED ORDER — RIVAROXABAN 10 MG PO TABS: 10.0000 mg | ORAL_TABLET | Freq: Every day | ORAL | Status: DC

## 2014-04-24 MED ORDER — ONDANSETRON HCL 4 MG/2ML IJ SOLN
4.0000 mg | Freq: Four times a day (QID) | INTRAMUSCULAR | Status: DC | PRN
  Administered 2014-04-27: 4 mg via INTRAVENOUS
  Filled 2014-04-24: qty 2

## 2014-04-24 MED ORDER — PHENOL 1.4 % MT LIQD
1.0000 | OROMUCOSAL | Status: DC | PRN
Start: 2014-04-24 — End: 2014-04-27

## 2014-04-24 MED ORDER — DEXAMETHASONE SODIUM PHOSPHATE 10 MG/ML IJ SOLN
10.0000 mg | Freq: Once | INTRAMUSCULAR | Status: AC
  Administered 2014-04-25: 10 mg via INTRAVENOUS
  Filled 2014-04-24: qty 1

## 2014-04-24 MED ORDER — MENTHOL 3 MG MT LOZG: 1.0000 | LOZENGE | OROMUCOSAL | Status: DC | PRN

## 2014-04-24 MED ORDER — EPHEDRINE SULFATE 50 MG/ML IJ SOLN
INTRAMUSCULAR | Status: DC | PRN
  Administered 2014-04-24 (×3): 5 mg via INTRAVENOUS

## 2014-04-24 MED ORDER — POTASSIUM CHLORIDE IN NACL 20-0.45 MEQ/L-% IV SOLN
INTRAVENOUS | Status: DC
  Filled 2014-04-24 (×6): qty 1000

## 2014-04-24 MED ORDER — LACTATED RINGERS IV SOLN
INTRAVENOUS | Status: DC | PRN
  Administered 2014-04-24 (×2): via INTRAVENOUS

## 2014-04-24 MED ORDER — DIPHENHYDRAMINE HCL 12.5 MG/5ML PO ELIX
12.5000 mg | ORAL_SOLUTION | ORAL | Status: DC | PRN
  Administered 2014-04-24: 25 mg via ORAL
  Filled 2014-04-24: qty 10

## 2014-04-24 MED ORDER — DOCUSATE SODIUM 100 MG PO CAPS
100.0000 mg | ORAL_CAPSULE | Freq: Two times a day (BID) | ORAL | Status: DC
  Administered 2014-04-24 – 2014-04-27 (×6): 100 mg via ORAL
  Filled 2014-04-24 (×7): qty 1

## 2014-04-24 MED ORDER — BUPIVACAINE HCL (PF) 0.25 % IJ SOLN
INTRAMUSCULAR | Status: AC
  Filled 2014-04-24: qty 30

## 2014-04-24 MED ORDER — RIVAROXABAN 10 MG PO TABS
10.0000 mg | ORAL_TABLET | Freq: Every day | ORAL | Status: DC
  Administered 2014-04-25 – 2014-04-27 (×3): 10 mg via ORAL
  Filled 2014-04-24 (×3): qty 1

## 2014-04-24 MED ORDER — ONDANSETRON HCL 4 MG PO TABS: 4.0000 mg | ORAL_TABLET | Freq: Three times a day (TID) | ORAL | Status: DC | PRN

## 2014-04-24 MED ORDER — SENNA 8.6 MG PO TABS
1.0000 | ORAL_TABLET | Freq: Two times a day (BID) | ORAL | Status: DC
  Administered 2014-04-24 – 2014-04-27 (×6): 8.6 mg via ORAL
  Filled 2014-04-24 (×6): qty 1

## 2014-04-24 MED ORDER — METHOCARBAMOL 500 MG PO TABS
500.0000 mg | ORAL_TABLET | Freq: Four times a day (QID) | ORAL | Status: DC | PRN
  Administered 2014-04-24 – 2014-04-27 (×8): 500 mg via ORAL
  Filled 2014-04-24 (×8): qty 1

## 2014-04-24 MED ORDER — CEFAZOLIN SODIUM-DEXTROSE 2-3 GM-% IV SOLR
2.0000 g | Freq: Four times a day (QID) | INTRAVENOUS | Status: AC
  Administered 2014-04-24 – 2014-04-25 (×2): 2 g via INTRAVENOUS
  Filled 2014-04-24 (×2): qty 50

## 2014-04-24 SURGICAL SUPPLY — 72 items
BLADE SAW SAG 73X25 THK (BLADE) ×1
BLADE SAW SGTL 73X25 THK (BLADE) ×1 IMPLANT
BNDG COHESIVE 3X5 TAN STRL LF (GAUZE/BANDAGES/DRESSINGS) ×1 IMPLANT
BRUSH FEMORAL CANAL (MISCELLANEOUS) IMPLANT
CAPT HIP TOTAL 2 ×1 IMPLANT
CLSR STERI-STRIP ANTIMIC 1/2X4 (GAUZE/BANDAGES/DRESSINGS) ×4 IMPLANT
COVER SURGICAL LIGHT HANDLE (MISCELLANEOUS) ×3 IMPLANT
DRAPE IMP U-DRAPE 54X76 (DRAPES) ×2 IMPLANT
DRAPE INCISE IOBAN 66X45 STRL (DRAPES) IMPLANT
DRAPE ORTHO SPLIT 77X108 STRL (DRAPES) ×4
DRAPE PROXIMA HALF (DRAPES) ×2 IMPLANT
DRAPE SURG ORHT 6 SPLT 77X108 (DRAPES) ×2 IMPLANT
DRAPE U-SHAPE 47X51 STRL (DRAPES) ×2 IMPLANT
DRILL BIT 5/64 (BIT) ×2 IMPLANT
DRSG MEPILEX BORDER 4X12 (GAUZE/BANDAGES/DRESSINGS) IMPLANT
DRSG MEPILEX BORDER 4X8 (GAUZE/BANDAGES/DRESSINGS) ×1 IMPLANT
DRSG PAD ABDOMINAL 8X10 ST (GAUZE/BANDAGES/DRESSINGS) IMPLANT
DURAPREP 26ML APPLICATOR (WOUND CARE) ×2 IMPLANT
ELECT CAUTERY BLADE 6.4 (BLADE) ×2 IMPLANT
ELECT REM PT RETURN 9FT ADLT (ELECTROSURGICAL) ×2
ELECTRODE REM PT RTRN 9FT ADLT (ELECTROSURGICAL) ×1 IMPLANT
FACESHIELD STD STERILE (MASK) ×2 IMPLANT
GAUZE SPONGE 4X4 12PLY STRL (GAUZE/BANDAGES/DRESSINGS) ×2 IMPLANT
GLOVE BIO SURGEON STRL SZ7 (GLOVE) ×1 IMPLANT
GLOVE BIOGEL PI IND STRL 6.5 (GLOVE) IMPLANT
GLOVE BIOGEL PI IND STRL 8 (GLOVE) ×1 IMPLANT
GLOVE BIOGEL PI INDICATOR 6.5 (GLOVE) ×1
GLOVE BIOGEL PI INDICATOR 8 (GLOVE) ×2
GLOVE BIOGEL PI ORTHO PRO SZ8 (GLOVE) ×1
GLOVE ORTHO TXT STRL SZ7.5 (GLOVE) ×3 IMPLANT
GLOVE PI ORTHO PRO STRL SZ8 (GLOVE) ×1 IMPLANT
GLOVE SURG ORTHO 8.0 STRL STRW (GLOVE) ×2 IMPLANT
GOWN STRL REUS W/ TWL XL LVL3 (GOWN DISPOSABLE) ×1 IMPLANT
GOWN STRL REUS W/TWL 2XL LVL3 (GOWN DISPOSABLE) ×2 IMPLANT
GOWN STRL REUS W/TWL XL LVL3 (GOWN DISPOSABLE) ×2
HANDPIECE INTERPULSE COAX TIP (DISPOSABLE)
HOOD PEEL AWAY FACE SHEILD DIS (HOOD) ×3 IMPLANT
KIT BASIN OR (CUSTOM PROCEDURE TRAY) ×2 IMPLANT
KIT ROOM TURNOVER OR (KITS) ×2 IMPLANT
MANIFOLD NEPTUNE II (INSTRUMENTS) ×2 IMPLANT
NDL 18GX1X1/2 (RX/OR ONLY) (NEEDLE) IMPLANT
NDL HYPO 25GX1X1/2 BEV (NEEDLE) ×1 IMPLANT
NDL SUT .5 MAYO 1.404X.05X (NEEDLE) ×1 IMPLANT
NEEDLE 18GX1X1/2 (RX/OR ONLY) (NEEDLE) ×2 IMPLANT
NEEDLE HYPO 25GX1X1/2 BEV (NEEDLE) ×2 IMPLANT
NEEDLE MAYO TAPER (NEEDLE) ×2
NS IRRIG 1000ML POUR BTL (IV SOLUTION) ×2 IMPLANT
PACK TOTAL JOINT (CUSTOM PROCEDURE TRAY) ×2 IMPLANT
PACK UNIVERSAL I (CUSTOM PROCEDURE TRAY) ×2 IMPLANT
PAD ARMBOARD 7.5X6 YLW CONV (MISCELLANEOUS) ×4 IMPLANT
PILLOW ABDUCTION HIP (SOFTGOODS) ×2 IMPLANT
PRESSURIZER FEMORAL UNIV (MISCELLANEOUS) IMPLANT
RETRIEVER SUT HEWSON (MISCELLANEOUS) ×2 IMPLANT
SET HNDPC FAN SPRY TIP SCT (DISPOSABLE) IMPLANT
SPONGE LAP 4X18 X RAY DECT (DISPOSABLE) IMPLANT
SUCTION FRAZIER TIP 10 FR DISP (SUCTIONS) ×2 IMPLANT
SUCTION FRAZIER TIP 8 FR DISP (SUCTIONS) ×1
SUCTION TUBE FRAZIER 8FR DISP (SUCTIONS) IMPLANT
SUT FIBERWIRE #2 38 REV NDL BL (SUTURE) ×6
SUT MNCRL AB 4-0 PS2 18 (SUTURE) ×2 IMPLANT
SUT VIC AB 0 CT1 27 (SUTURE) ×2
SUT VIC AB 0 CT1 27XBRD ANBCTR (SUTURE) ×1 IMPLANT
SUT VIC AB 2-0 CT1 27 (SUTURE) ×2
SUT VIC AB 2-0 CT1 TAPERPNT 27 (SUTURE) ×1 IMPLANT
SUT VIC AB 3-0 SH 8-18 (SUTURE) ×2 IMPLANT
SUTURE FIBERWR#2 38 REV NDL BL (SUTURE) ×3 IMPLANT
SYR CONTROL 10ML LL (SYRINGE) ×3 IMPLANT
TOWEL OR 17X24 6PK STRL BLUE (TOWEL DISPOSABLE) ×2 IMPLANT
TOWEL OR 17X26 10 PK STRL BLUE (TOWEL DISPOSABLE) ×2 IMPLANT
TOWER CARTRIDGE SMART MIX (DISPOSABLE) IMPLANT
TRAY FOLEY CATH 14FR (SET/KITS/TRAYS/PACK) IMPLANT
WATER STERILE IRR 1000ML POUR (IV SOLUTION) ×4 IMPLANT

## 2014-04-24 NOTE — Transfer of Care (Signed)
Immediate Anesthesia Transfer of Care Note  Patient: [name]  Procedure(s) Performed: Procedure(s): LEFT TOTAL HIP ARTHROPLASTY (Left)  Patient Location: PACU  Anesthesia Type:General  Level of Consciousness: awake, alert , oriented and patient cooperative  Airway & Oxygen Therapy: Patient Spontanous Breathing and Patient connected to nasal cannula oxygen  Post-op Assessment: Report given to RN, Post -op Vital signs reviewed and stable, Patient moving all extremities and Patient moving all extremities X 4  Post vital signs: Reviewed and stable  Last Vitals:  Filed Vitals:   04/24/14 0828  BP: 139/91  Pulse: 82  Temp: 36.8 C  Resp: 20    Complications: No apparent anesthesia complications

## 2014-04-24 NOTE — Op Note (Signed)
04/24/2014  11:07 AM  PATIENT:  [name]   MRN: [id number]  PRE-OPERATIVE DIAGNOSIS:  Left hip primary localized osteoarthritis  POST-OPERATIVE DIAGNOSIS:  Same  PROCEDURE:  Procedure(s): LEFT TOTAL HIP ARTHROPLASTY  PREOPERATIVE INDICATIONS:    [name] is an 44 y.o. male who has a diagnosis of Primary localized osteoarthritis of left hip and elected for surgical management after failing conservative treatment.  The risks benefits and alternatives were discussed with the patient including but not limited to the risks of nonoperative treatment, versus surgical intervention including infection, bleeding, nerve injury, periprosthetic fracture, the need for revision surgery, dislocation, leg length discrepancy, blood clots, cardiopulmonary complications, morbidity, mortality, among others, and they were willing to proceed.     OPERATIVE REPORT     SURGEON:  Marchia Bond, MD    ASSISTANT:  Joya Gaskins, OPA-C  (Present throughout the entire procedure,  necessary for completion of procedure in a timely manner, assisting with retraction, instrumentation, and closure)     ANESTHESIA:  General    COMPLICATIONS:  None.     COMPONENTS:  [employer] fit femur size 6 with a 36 mm +1.5 head ball and a gription acetabular shell size 54 with a neutral polyethylene liner  Unique aspects of the case: He was extremely stiff, extremely difficult to dislocate to begin with. He had significant loss of motion. The acetabulum had nearly circumferential rimming osteophytes, that were very substantial especially anteriorly. These had to be removed after placement of the real cup. I considered whether or not to use a high offset versus a standard stem, and the standard stem was extremely stable. The soft tissue tension additionally felt appropriate, so I did not utilize a high offset stem. He had a small amount of shuck, although he felt slightly longer on the left side compared to the right,  but within clinical range.    PROCEDURE IN DETAIL:   The patient was met in the holding area and  identified.  The appropriate hip was identified and marked at the operative site.  The patient was then transported to the OR  and  placed under general anesthesia.  At that point, the patient was  placed in the lateral decubitus position with the operative side up and  secured to the operating room table and all bony prominences padded.     The operative lower extremity was prepped from the iliac crest to the distal leg.  Sterile draping was performed.  Time out was performed prior to incision.      A routine posterolateral approach was utilized via sharp dissection  carried down to the subcutaneous tissue.  Gross bleeders were Bovie coagulated.  The iliotibial band was identified and incised along the length of the skin incision.  Self-retaining retractors were  inserted.  With the hip internally rotated, the short external rotators  were identified. The piriformis and capsule was tagged with FiberWire, and the hip capsule released in a T-type fashion.  The femoral neck was exposed, and I resected the femoral neck using the appropriate jig. This was performed at approximately a thumb's breadth above the lesser trochanter.    I then exposed the deep acetabulum, cleared out any tissue including the ligamentum teres.  A wing retractor was placed.  After adequate visualization, I excised the labrum, and then sequentially reamed.  I placed the trial acetabulum, which seated nicely, and then impacted the real cup into place.  Appropriate version and inclination was confirmed clinically matching  their bony anatomy, and also with the use of the jig.  A trial polyethylene liner was placed and the wing retractor removed.    I then prepared the proximal femur using the cookie-cutter, the lateralizing reamer, and then sequentially reamed and broached.  A trial broach, neck, and head was utilized, and I reduced  the hip and it was found to have excellent stability with functional range of motion. The trial components were then removed, and the real polyethylene liner was placed with the lip directed posteriorly.  I then impacted the real femoral prosthesis into place into the appropriate version, slightly anteverted to the normal anatomy, and I impacted the real head ball into place. The hip was then reduced and taken through functional range of motion and found to have excellent stability. Leg lengths were restored.  I then used a 2 mm drill bits to pass the FiberWire suture from the capsule and piriformis through the greater trochanter, and secured this. Excellent posterior capsular repair was achieved. I also closed the T in the capsule.  I then irrigated the hip copiously again with pulse lavage, and repaired the fascia with Vicryl, followed by Vicryl for the subcutaneous tissue, Monocryl for the skin, Steri-Strips and sterile gauze. The wounds were injected. The patient was then awakened and returned to PACU in stable and satisfactory condition. There were no complications.  Marchia Bond, MD Orthopedic Surgeon [phone]   04/24/2014 11:07 AM

## 2014-04-24 NOTE — Anesthesia Preprocedure Evaluation (Addendum)
Anesthesia Evaluation  Patient identified by MRN, date of birth, ID band Patient awake    Reviewed: Allergy & Precautions, NPO status , Patient's Chart, lab work & pertinent test results  Airway Mallampati: II  TM Distance: >3 FB Neck ROM: Full    Dental  (+) Chipped   Pulmonary neg pulmonary ROS,  breath sounds clear to auscultation  Pulmonary exam normal       Cardiovascular negative cardio ROS  Rhythm:Regular Rate:Normal     Neuro/Psych Anxiety negative neurological ROS  negative psych ROS   GI/Hepatic Neg liver ROS, GERD-  ,  Endo/Other  diabetes  Renal/GU negative Renal ROS  negative genitourinary   Musculoskeletal negative musculoskeletal ROS (+)   Abdominal   Peds negative pediatric ROS (+)  Hematology negative hematology ROS (+)   Anesthesia Other Findings   Reproductive/Obstetrics negative OB ROS                            Anesthesia Physical Anesthesia Plan  ASA: II  Anesthesia Plan: General   Post-op Pain Management:    Induction: Intravenous  Airway Management Planned: Oral ETT  Additional Equipment:   Intra-op Plan:   Post-operative Plan: Extubation in OR  Informed Consent: I have reviewed the patients History and Physical, chart, labs and discussed the procedure including the risks, benefits and alternatives for the proposed anesthesia with the patient or authorized representative who has indicated his/her understanding and acceptance.   Dental advisory given  Plan Discussed with: CRNA and Surgeon  Anesthesia Plan Comments:         Anesthesia Quick Evaluation

## 2014-04-24 NOTE — H&P (Signed)
PREOPERATIVE H&P  Chief Complaint: djd left hip  HPI: [place] [name] is a 44 y.o. male who presents for preoperative history and physical with a diagnosis of djd left hip. Symptoms are rated as moderate to severe, and have been worsening.  This is significantly impairing activities of daily living.  He has elected for surgical management.   He has failed injections, activity modification, anti-inflammatories (ulcers), and assistive devices.  Preoperative X-rays demonstrate end stage degenerative changes with osteophyte formation, loss of joint space, subchondral sclerosis.   Past Medical History  Diagnosis Date  . Upper GI bleed   . Ventral hernia   . Hiatal hernia   . History of blood clots 2012    in abdomen  . Dizziness     occasionally  . Joint pain     knees  . GERD (gastroesophageal reflux disease)     takes Protonix bid  . Gastric ulcer     history of  . Constipation     related to pain meds  . Nocturia     depends on amount of fluid he drinks  . History of blood transfusion 2012  . Anxiety     takes Xanax daily prn  . External hemorrhoid, bleeding   . Diabetes mellitus without complication    Past Surgical History  Procedure Laterality Date  . Abdominal surgery  10/2010    d/t bleeding ulcers; "oversewing of gastric ulcer"  . Esophagogastroduodenoscopy    . Hernia repair  09/04/11    incisional   . Incisional hernia repair  09/04/2011    Procedure: HERNIA REPAIR INCISIONAL;  Surgeon: Madilyn Hook, DO;  Location: [place];  Service: General;  Laterality: N/A;  debridment calcified mass   History   Social History  . Marital Status: Single    Spouse Name: N/A  . Number of Children: N/A  . Years of Education: N/A   Social History Main Topics  . Smoking status: Never Smoker   . Smokeless tobacco: Current User    Types: Chew     Comment: occ-Chew  . Alcohol Use: 0.0 oz/week    0 Standard drinks or equivalent per week     Comment: 09/04/11 "very seldom"  . Drug  Use: Yes  . Sexual Activity: Yes   Other Topics Concern  . None   Social History Narrative   Family History  Problem Relation Age of Onset  . Diabetes Mother   . Diabetes Father   . Heart disease Father    Allergies  Allergen Reactions  . Dilaudid [Hydromorphone Hcl] Rash and Other (See Comments)    Shakey  . Metoclopramide Hcl Hives, Swelling and Rash    Swelling in lips   Prior to Admission medications   Medication Sig Start Date End Date Taking? Authorizing Provider  acetaminophen (TYLENOL) 500 MG tablet Take 1,000 mg by mouth every 6 (six) hours as needed (pain).   Yes Historical Provider, MD  busPIRone (BUSPAR) 30 MG tablet Take 1 tablet (30 mg total) by mouth 2 (two) times daily. 03/26/14  Yes [name], MD  clonazePAM (KLONOPIN) 0.25 MG disintegrating tablet Take 1 tablet (0.25 mg total) by mouth at bedtime as needed (insomnia, panic attack). 04/11/14  Yes [name], MD  HYDROcodone-acetaminophen (NORCO/VICODIN) 5-325 MG per tablet Take 1 tablet by mouth every 6 (six) hours as needed for moderate pain. 07/26/13  Yes Spencer Copland, MD  metFORMIN (GLUCOPHAGE) 500 MG tablet TAKE 1 TABLET BY MOUTH EVERY MORNING WITH BREAKFAST  Patient taking differently: Take 500 mg by mouth daily with breakfast.  01/16/14  Yes [name], MD  pantoprazole (PROTONIX) 40 MG tablet Take 1 tablet (40 mg total) by mouth 2 (two) times daily. 01/01/14  Yes [name], MD  zolpidem (AMBIEN) 10 MG tablet TAKE 1 TABLET BY MOUTH AT BEDTIME  PATIENT NEEDS OFFICE VISIT 04/17/14  Yes [name], MD  sildenafil (VIAGRA) 100 MG tablet Take 0.5-1 tablets (50-100 mg total) by mouth daily as needed for erectile dysfunction. 01/01/14   [name], MD     Positive ROS: All other systems have been reviewed and were otherwise negative with the exception of those mentioned in the HPI and as above.  Physical Exam: General: Alert, no acute distress Cardiovascular: No pedal edema Respiratory: No cyanosis,  no use of accessory musculature GI: No organomegaly, abdomen is soft and non-tender Skin: No lesions in the area of chief complaint Neurologic: Sensation intact distally Psychiatric: Patient is competent for consent with normal mood and affect Lymphatic: No axillary or cervical lymphadenopathy  MUSCULOSKELETAL: left hip arom 0-100, 10 deg internal and ext rotation, positive groin pain, EHL intact  Assessment: Left hip primary localized osteoarthritis  Plan: Plan for Procedure(s): LEFT TOTAL HIP ARTHROPLASTY  The risks benefits and alternatives were discussed with the patient including but not limited to the risks of nonoperative treatment, versus surgical intervention including infection, bleeding, nerve injury,  blood clots, cardiopulmonary complications, morbidity, mortality, among others, and they were willing to proceed.   Johnny Bridge, MD Cell [phone]   04/24/2014 9:02 AM

## 2014-04-24 NOTE — Anesthesia Postprocedure Evaluation (Signed)
  Anesthesia Post-op Note  Patient: [name]  Procedure(s) Performed: Procedure(s): LEFT TOTAL HIP ARTHROPLASTY (Left)  Patient Location: PACU  Anesthesia Type: General   Level of Consciousness: awake, alert  and oriented  Airway and Oxygen Therapy: Patient Spontanous Breathing  Post-op Pain: mild  Post-op Assessment: Post-op Vital signs reviewed  Post-op Vital Signs: Reviewed  Last Vitals:  Filed Vitals:   04/24/14 1248  BP:   Pulse:   Temp: 36.5 C  Resp:     Complications: No apparent anesthesia complications

## 2014-04-24 NOTE — Anesthesia Procedure Notes (Signed)
Procedure Name: Intubation Date/Time: 04/24/2014 9:19 AM Performed by: Shirlyn Goltz Pre-anesthesia Checklist: Patient identified, Emergency Drugs available, Suction available and Patient being monitored Patient Re-evaluated:Patient Re-evaluated prior to inductionOxygen Delivery Method: Circle system utilized Preoxygenation: Pre-oxygenation with 100% oxygen Intubation Type: IV induction Ventilation: Oral airway inserted - appropriate to patient size Laryngoscope size: dlx1 crna with mill 2 grade 3 view unable to pass tube.  MD dlx1 with mill 2, grade 2 view, bougie utilized, tube passed successfully. Grade View: Grade II Tube type: Oral Tube size: 7.0 mm Number of attempts: 2 Airway Equipment and Method: Stylet Placement Confirmation: ETT inserted through vocal cords under direct vision,  positive ETCO2 and breath sounds checked- equal and bilateral Secured at: 21 cm Tube secured with: Tape

## 2014-04-25 ENCOUNTER — Encounter (HOSPITAL_COMMUNITY): Payer: Self-pay | Admitting: Orthopedic Surgery

## 2014-04-25 LAB — CBC
HCT: 40.1 % (ref 39.0–52.0)
Hemoglobin: 13.2 g/dL (ref 13.0–17.0)
MCH: 30.5 pg (ref 26.0–34.0)
MCHC: 32.9 g/dL (ref 30.0–36.0)
MCV: 92.6 fL (ref 78.0–100.0)
PLATELETS: 288 10*3/uL (ref 150–400)
RBC: 4.33 MIL/uL (ref 4.22–5.81)
RDW: 13.6 % (ref 11.5–15.5)
WBC: 11.5 10*3/uL — AB (ref 4.0–10.5)

## 2014-04-25 LAB — BASIC METABOLIC PANEL
Anion gap: 7 (ref 5–15)
BUN: 9 mg/dL (ref 6–23)
CO2: 29 mmol/L (ref 19–32)
CREATININE: 1.03 mg/dL (ref 0.50–1.35)
Calcium: 8.8 mg/dL (ref 8.4–10.5)
Chloride: 100 mmol/L (ref 96–112)
GFR, EST NON AFRICAN AMERICAN: 87 mL/min — AB (ref >90)
Glucose, Bld: 88 mg/dL (ref 70–99)
Potassium: 3.8 mmol/L (ref 3.5–5.1)
Sodium: 136 mmol/L (ref 135–145)

## 2014-04-25 LAB — GLUCOSE, CAPILLARY
GLUCOSE-CAPILLARY: 112 mg/dL — AB (ref 70–99)
Glucose-Capillary: 100 mg/dL — ABNORMAL HIGH (ref 70–99)
Glucose-Capillary: 88 mg/dL (ref 70–99)
Glucose-Capillary: 130 mg/dL — ABNORMAL HIGH (ref 70–99)
Glucose-Capillary: 115 mg/dL — ABNORMAL HIGH (ref 70–99)

## 2014-04-25 MED ORDER — PNEUMOCOCCAL VAC POLYVALENT 25 MCG/0.5ML IJ INJ
0.5000 mL | INJECTION | INTRAMUSCULAR | Status: AC
Start: 2014-04-26 — End: 2014-04-27
  Administered 2014-04-27: 0.5 mL via INTRAMUSCULAR
  Filled 2014-04-25: qty 0.5

## 2014-04-25 NOTE — Progress Notes (Addendum)
Patient ID: [name], male   DOB: August 05, 1970, 44 y.o.   MRN: [id number]     Subjective:  Patient reports pain as severe 12/10.  Patient denies any CP or SOB.    Objective:   VITALS:   Filed Vitals:   04/24/14 1707 04/24/14 2030 04/25/14 0045 04/25/14 0500  BP: 132/80 104/62 106/67 107/62  Pulse:  120 96 73  Temp: 99.3 F (37.4 C) 99 F (37.2 C) 98.2 F (36.8 C) 98.6 F (37 C)  TempSrc:      Resp:  16 16 16   Height:      Weight:      SpO2: 95% 93% 93% 98%    ABD soft Sensation intact distally Dorsiflexion/Plantar flexion intact Incision: dressing C/D/I and no drainage Patient has good foot and ankle motion   Lab Results  Component Value Date   WBC 8.0 04/12/2014   HGB 15.5 04/12/2014   HCT 44.4 04/12/2014   MCV 91.2 04/12/2014   PLT 312 04/12/2014   BMET    Component Value Date/Time   NA 141 04/12/2014 0949   K 4.8 04/12/2014 0949   CL 104 04/12/2014 0949   CO2 31 04/12/2014 0949   GLUCOSE 109* 04/12/2014 0949   BUN 13 04/12/2014 0949   CREATININE 1.17 04/12/2014 0949   CALCIUM 9.9 04/12/2014 0949   GFRNONAA 75* 04/12/2014 0949   GFRAA 87* 04/12/2014 0949     Assessment/Plan: 1 Day Post-Op   Principal Problem:   Primary localized osteoarthritis of left hip   Advance diet Up with therapy WBAT Dry dressing PRN Patient should work with PT this AM and maybe this PM as well if he would like to go home today Plan for [place], [place] 04/25/2014, 7:07 AM  Seen and agree.  Plan dc home tomorrow.  Marchia Bond, MD Cell [phone]

## 2014-04-25 NOTE — Evaluation (Signed)
Physical Therapy Evaluation Patient Details Name: [name] MRN: [id number] DOB: [birth year] Today's Date: 04/25/2014   History of Present Illness  Pt admitted 3/22 for elective L THA posterior approach. Pt with h/o hernia and anxiety.  Clinical Impression  Pt is s/p THA resulting in the deficits listed below (see PT Problem List). Pt tolerated OOB mobility for first time. Pt will benefit from skilled PT to increase their independence and safety with mobility to allow discharge to the venue listed below.      Follow Up Recommendations Home health PT;Supervision/Assistance - 24 hour    Equipment Recommendations  Rolling walker with 5" wheels;3in1 (PT)    Recommendations for Other Services       Precautions / Restrictions Precautions Precautions: Posterior Hip Precaution Booklet Issued: Yes (comment) Precaution Comments: pt with verbal understanding as well as brother present. pt edu on purpose of abductor pillow as well Restrictions Weight Bearing Restrictions: Yes LLE Weight Bearing: Weight bearing as tolerated      Mobility  Bed Mobility Overal bed mobility: Needs Assistance Bed Mobility: Supine to Sit (long sit)     Supine to sit: Min assist     General bed mobility comments: max directional v/c's to maintain post hip prec and minA for L LE management.encouraged pt to complete quad set and then abd L LE to minimize pain  Transfers Overall transfer level: Needs assistance Equipment used: Rolling walker (2 wheeled) Transfers: Sit to/from Stand Sit to Stand: Min guard         General transfer comment: v/c's for safe hand placement and sequencing  Ambulation/Gait Ambulation/Gait assistance: Min guard Ambulation Distance (Feet): 70 Feet Assistive device: Rolling walker (2 wheeled) Gait Pattern/deviations: Step-to pattern;Decreased stance time - left;Decreased step length - left;Decreased stride length;Decreased weight shift to left Gait velocity: slow    General Gait Details: v/c's to place L foot flat, v/c's and minA with RW to progress to step through pattern. pt with significant reliance on bilat UEs  Stairs            Wheelchair Mobility    Modified Rankin (Stroke Patients Only)       Balance Overall balance assessment: Needs assistance         Standing balance support: Bilateral upper extremity supported Standing balance-Leahy Scale: Fair Standing balance comment: pt able to static stand without UE support but requires RW for amb                             Pertinent Vitals/Pain Pain Assessment: 0-10 Pain Score: 8  Pain Location: L hip/surgical site Pain Intervention(s): Premedicated before session    Home Living Family/patient expects to be discharged to:: Private residence Living Arrangements:  (brother) Available Help at Discharge: Family;Available 24 hours/day Type of Home: House Home Access: Stairs to enter Entrance Stairs-Rails: None Entrance Stairs-Number of Steps: 1 Home Layout: Two level;Able to live on main level with bedroom/bathroom Home Equipment: None      Prior Function Level of Independence: Independent         Comments: pt works full time     Journalist, newspaper        Extremity/Trunk Assessment   Upper Extremity Assessment: Overall WFL for tasks assessed           Lower Extremity Assessment: LLE deficits/detail   LLE Deficits / Details: able to complete quad set, minimal initiation of L hip flexion  Cervical / Trunk Assessment: Normal  Communication   Communication: No difficulties  Cognition Arousal/Alertness: Awake/alert Behavior During Therapy: WFL for tasks assessed/performed (slightly anxious) Overall Cognitive Status: Within Functional Limits for tasks assessed                      General Comments General comments (skin integrity, edema, etc.): upon return to room pt reports "I feel like i'm going to go out. " Pt instructed on relaxtion  breathing, given cold wash cloth and BP taken at 103/67. Pt given saltines as pt reports just taking medication but hasn't eaten breakfast.     Exercises Total Joint Exercises Ankle Circles/Pumps: AROM;Both;10 reps;Supine Quad Sets: AROM;Left;10 reps;Supine Gluteal Sets: AROM;Both;10 reps;Supine      Assessment/Plan    PT Assessment Patient needs continued PT services  PT Diagnosis Difficulty walking;Acute pain   PT Problem List Decreased strength;Decreased range of motion;Decreased activity tolerance;Decreased balance;Decreased mobility;Decreased knowledge of use of DME  PT Treatment Interventions DME instruction;Gait training;Stair training;Functional mobility training;Therapeutic activities;Therapeutic exercise   PT Goals (Current goals can be found in the Care Plan section) Acute Rehab PT Goals Patient Stated Goal: home PT Goal Formulation: With patient Time For Goal Achievement: 05/02/14 Potential to Achieve Goals: Good    Frequency 7X/week   Barriers to discharge        Co-evaluation               End of Session Equipment Utilized During Treatment: Gait belt Activity Tolerance: Patient tolerated treatment well Patient left: in chair;with call bell/phone within reach;with nursing/sitter in room Nurse Communication: Mobility status         Time: 3845-3646 PT Time Calculation (min) (ACUTE ONLY): 40 min   Charges:   PT Evaluation $Initial PT Evaluation Tier I: 1 Procedure PT Treatments $Gait Training: 8-22 mins $Therapeutic Exercise: 8-22 mins   PT G Codes:        Kingsley Callander 04/25/2014, 10:14 AM   Kittie Plater, PT, DPT Pager #: [phone] Office #: [phone]

## 2014-04-25 NOTE — Evaluation (Signed)
Occupational Therapy Evaluation Patient Details Name: [name] MRN: [id number] DOB: 10-18-1970 Today's Date: 04/25/2014    History of Present Illness Pt admitted 3/22 for elective L THA posterior approach. Pt with h/o hernia and anxiety.   Clinical Impression   Patient independent and working full time PTA. Patient currently requires up to mod assist for LB ADLs and min guard for functional mobility/transfers. Patient will benefit from acute OT to increase overall independence in the areas of ADLs, functional mobility, education on use of AE &DME, and overall safety in order to safely discharge home with assistance from family.     Follow Up Recommendations  No OT follow up;Supervision/Assistance - 24 hour    Equipment Recommendations  3 in 1 bedside comode;Other (comment) (AE - reacher, sock aid, LH sponge, LH shoe horn)    Recommendations for Other Services  None at this time     Precautions / Restrictions Precautions Precautions: Posterior Hip Precaution Booklet Issued: Yes (comment) Precaution Comments: Reviewed 3/3 posterior hip precautions with patient and family, patient able to verbalize 2/3 precautions independently  Restrictions Weight Bearing Restrictions: Yes LLE Weight Bearing: Weight bearing as tolerated      Mobility - Per PT  Bed Mobility Overal bed mobility: Needs Assistance Bed Mobility: Supine to Sit (long sit)     Supine to sit: Min assist     General bed mobility comments: max directional v/c's to maintain post hip prec and minA for L LE management.encouraged pt to complete quad set and then abd L LE to minimize pain  Transfers Overall transfer level: Needs assistance Equipment used: Rolling walker (2 wheeled) Transfers: Sit to/from Stand Sit to Stand: Min guard         General transfer comment: v/c's for safe hand placement and sequencing   Patient found seated in recliner upon OT entering room. No transfer occurred secondary to  patient with increased pain and anxiety.     Balance - Per PT Overall balance assessment: Needs assistance         Standing balance support: Bilateral upper extremity supported Standing balance-Leahy Scale: Fair Standing balance comment: pt able to static stand without UE support but requires RW for amb     ADL Overall ADL's : Needs assistance/impaired Eating/Feeding: Set up;Sitting   Grooming: Set up;Sitting   Upper Body Bathing: Set up;Sitting   Lower Body Bathing: Moderate assistance;Sit to/from stand;Cueing for safety   Upper Body Dressing : Set up;Sitting   Lower Body Dressing: Moderate assistance;Sit to/from stand;Cueing for safety     General ADL Comments: No transfer occurred secondary to patient with increased pain and anxiety during OT eval. Patient able to reach RLE for LB ADLs. Introduced, educated, demonstrated use of AE to increase independence with LB ADLs. Reviewed hip precautions and patient able to state 2/3 independently.       Vision Additional Comments: Pt with redness around eyes          Pertinent Vitals/Pain Pain Assessment: 0-10 Pain Score: 10-Worst pain ever Pain Location: Left hip Pain Descriptors / Indicators: Aching;Sore Pain Intervention(s): Monitored during session;Limited activity within patient's tolerance     Hand Dominance Right   Extremity/Trunk Assessment Upper Extremity Assessment Upper Extremity Assessment: Overall WFL for tasks assessed   Lower Extremity Assessment Lower Extremity Assessment: LLE deficits/detail LLE Deficits / Details: able to complete quad set, minimal initiation of L hip flexion   Cervical / Trunk Assessment Cervical / Trunk Assessment: Normal   Communication Communication Communication: No difficulties  Cognition Arousal/Alertness: Awake/alert Behavior During Therapy: WFL for tasks assessed/performed (h/o anxiety) Overall Cognitive Status: Within Functional Limits for tasks assessed              Home Living Family/patient expects to be discharged to:: Private residence Living Arrangements:  (brother and sister-in-law) Available Help at Discharge: Family;Available 24 hours/day Type of Home: House Home Access: Stairs to enter [employer] of Steps: 1 Entrance Stairs-Rails: None Home Layout: Two level;Able to live on main level with bedroom/bathroom Alternate Level Stairs-Number of Steps: 12 Alternate Level Stairs-Rails: Right Bathroom Shower/Tub: Walk-in shower;Curtain   Bathroom Toilet: Standard     Home Equipment: None    Prior Functioning/Environment Level of Independence: Independent        Comments: pt works full time    OT Diagnosis: Generalized weakness;Acute pain   OT Problem List: Decreased strength;Decreased range of motion;Decreased activity tolerance;Impaired balance (sitting and/or standing);Decreased knowledge of use of DME or AE;Decreased safety awareness;Decreased knowledge of precautions;Pain   OT Treatment/Interventions: Self-care/ADL training;Therapeutic exercise;Energy conservation;DME and/or AE instruction;Therapeutic activities;Patient/family education;Balance training    OT Goals(Current goals can be found in the care plan section) Acute Rehab OT Goals Patient Stated Goal: go home tomorrow OT Goal Formulation: With patient/family Time For Goal Achievement: 05/02/14 Potential to Achieve Goals: Good ADL Goals Pt Will Perform Grooming: with modified independence;standing Pt Will Perform Lower Body Bathing: with set-up;sit to/from stand;with adaptive equipment Pt Will Perform Lower Body Dressing: with set-up;with adaptive equipment;sit to/from stand Pt Will Transfer to Toilet: with modified independence;ambulating;bedside commode Pt Will Perform Tub/Shower Transfer: with supervision;Shower transfer;rolling walker;3 in 1;ambulating Additional ADL Goal #1: Patient will independently verbalize and adhere to 3/3 posterior hip precautions   OT Frequency: Min 2X/week   Barriers to D/C: None known at this time          End of Session Activity Tolerance: Patient limited by pain Patient left: in chair;with call bell/phone within reach;with family/visitor present   Time: 1030-1047 OT Time Calculation (min): 17 min Charges:  OT General Charges $OT Visit: 1 Procedure OT Evaluation $Initial OT Evaluation Tier I: 1 Procedure  Sharalyn Lomba , MS, OTR/L, CLT Pager: [phone]   04/25/2014, 10:59 AM

## 2014-04-25 NOTE — Progress Notes (Signed)
Physical Therapy Treatment Patient Details Name: [name] MRN: [id number] DOB: 01-Jul-1970 Today's Date: 04/25/2014    History of Present Illness Pt admitted 3/22 for elective L THA posterior approach. Pt with h/o hernia and anxiety.    PT Comments    Pt demonstrated ability to ascend/descend 1 step and ambulate 250 ft this session.  Pt tolerated PT session very well.  Pt is anticipating d/c to home w/ HHPT tomorrow.  Follow Up Recommendations  Home health PT;Supervision/Assistance - 24 hour     Equipment Recommendations  Rolling walker with 5" wheels;3in1 (PT)    Recommendations for Other Services       Precautions / Restrictions Precautions Precautions: Posterior Hip Precaution Comments: reviewed 3/3 precautions; pt able to remember 2/3 Restrictions Weight Bearing Restrictions: Yes LLE Weight Bearing: Weight bearing as tolerated    Mobility  Bed Mobility Overal bed mobility: Needs Assistance Bed Mobility: Supine to Sit;Sit to Supine     Supine to sit: Modified independent (Device/Increase time) Sit to supine: Modified independent (Device/Increase time)   General bed mobility comments: pt uses handrails and requires increased time  Transfers Overall transfer level: Needs assistance Equipment used: Rolling walker (2 wheeled) Transfers: Sit to/from Stand Sit to Stand: Supervision            Ambulation/Gait Ambulation/Gait assistance: Supervision Ambulation Distance (Feet): 250 Feet Assistive device: Rolling walker (2 wheeled) Gait Pattern/deviations: Step-through pattern;Step-to pattern;Antalgic;Decreased stride length;Decreased stance time - left;Trunk flexed   Gait velocity interpretation: Below normal speed for age/gender General Gait Details: v/c's to stand upright during gait    Stairs Stairs: Yes Stairs assistance: Min guard Stair Management: No rails;Forwards;With walker Number of Stairs: 1 (x5) General stair comments: Pt reports that  ascending/descending stair makes his L hip feel better and less stiff  Wheelchair Mobility    Modified Rankin (Stroke Patients Only)       Balance Overall balance assessment: Needs assistance Sitting-balance support: Feet supported;No upper extremity supported Sitting balance-Leahy Scale: Good     Standing balance support: Bilateral upper extremity supported Standing balance-Leahy Scale: Fair                      Cognition Arousal/Alertness: Awake/alert Behavior During Therapy: WFL for tasks assessed/performed Overall Cognitive Status: Within Functional Limits for tasks assessed                      Exercises Total Joint Exercises Ankle Circles/Pumps: AROM;Both;10 reps;Supine Quad Sets: AROM;Left;10 reps;Supine Short Arc Quad: AROM;Left;10 reps;Supine Heel Slides: AROM;Left;10 reps;Supine Hip ABduction/ADduction: AROM;Left;10 reps;Supine    General Comments        Pertinent Vitals/Pain Pain Assessment: 0-10 Pain Score: 8  Pain Location: L hip Pain Descriptors / Indicators: Sore Pain Intervention(s): Limited activity within patient's tolerance;Monitored during session;Repositioned    Home Living                      Prior Function            PT Goals (current goals can now be found in the care plan section) Acute Rehab PT Goals Patient Stated Goal: go home tomorrow Progress towards PT goals: Progressing toward goals    Frequency  7X/week    PT Plan Current plan remains appropriate    Co-evaluation             End of Session Equipment Utilized During Treatment: Gait belt Activity Tolerance: Patient tolerated treatment well Patient left: in  bed;with call bell/phone within reach     Time: 1427-1514 PT Time Calculation (min) (ACUTE ONLY): 47 min  Charges:  $Gait Training: 23-37 mins $Therapeutic Exercise: 8-22 mins                    G CodesJoslyn Hy PT, [place] [phone] 04/25/2014, 3:59 PM

## 2014-04-25 NOTE — Discharge Instructions (Addendum)
Diet: As you were doing prior to hospitalization   Shower:  May shower but keep the wounds dry, use an occlusive plastic wrap, NO SOAKING IN TUB.  If the bandage gets wet, change with a clean dry gauze.  Dressing:  You may change your dressing 3-5 days after surgery.  Then change the dressing daily with sterile gauze dressing.    There are sticky tapes (steri-strips) on your wounds and all the stitches are absorbable.  Leave the steri-strips in place when changing your dressings, they will peel off with time, usually 2-3 weeks.  Activity:  Increase activity slowly as tolerated, but follow the weight bearing instructions below.  No lifting or driving for 6 weeks.  Weight Bearing:   As tolerated.    To prevent constipation: you may use a stool softener such as -  Colace (over the counter) 100 mg by mouth twice a day  Drink plenty of fluids (prune juice may be helpful) and high fiber foods Miralax (over the counter) for constipation as needed.    Itching:  If you experience itching with your medications, try taking only a single pain pill, or even half a pain pill at a time.  You may take up to 10 pain pills per day, and you can also use benadryl over the counter for itching or also to help with sleep.   Precautions:  If you experience chest pain or shortness of breath - call 911 immediately for transfer to the hospital emergency department!!  If you develop a fever greater that 101 F, purulent drainage from wound, increased redness or drainage from wound, or calf pain -- Call the office at [phone]                                                Follow- Up Appointment:  Please call for an appointment to be seen in 2 weeks [place] - [phone]     Information on my medicine - XARELTO (Rivaroxaban)  This medication education was reviewed with me or my healthcare representative as part of my discharge preparation.   Why was Xarelto prescribed for you? Xarelto was prescribed  for you to reduce the risk of blood clots forming after orthopedic surgery. The medical term for these abnormal blood clots is venous thromboembolism (VTE).  What do you need to know about xarelto ? Take your Xarelto ONCE DAILY at the same time every day. You may take it either with or without food.  If you have difficulty swallowing the tablet whole, you may crush it and mix in applesauce just prior to taking your dose.  Take Xarelto exactly as prescribed by your doctor and DO NOT stop taking Xarelto without talking to the doctor who prescribed the medication.  Stopping without other VTE prevention medication to take the place of Xarelto may increase your risk of developing a clot.  After discharge, you should have regular check-up appointments with your healthcare provider that is prescribing your Xarelto.    What do you do if you miss a dose? If you miss a dose, take it as soon as you remember on the same day then continue your regularly scheduled once daily regimen the next day. Do not take two doses of Xarelto on the same day.   Important Safety Information A possible side effect of Xarelto is bleeding. You should call your  healthcare provider right away if you experience any of the following: ? Bleeding from an injury or your nose that does not stop. ? Unusual colored urine (red or dark brown) or unusual colored stools (red or black). ? Unusual bruising for unknown reasons. ? A serious fall or if you hit your head (even if there is no bleeding).  Some medicines may interact with Xarelto and might increase your risk of bleeding while on Xarelto. To help avoid this, consult your healthcare provider or pharmacist prior to using any new prescription or non-prescription medications, including herbals, vitamins, non-steroidal anti-inflammatory drugs (NSAIDs) and supplements.  This website has more information on Xarelto: [email or web address]://guerra-benson.com/.  INSTRUCTIONS AFTER JOINT REPLACEMENT    o Remove items at home which could result in a fall. This includes throw rugs or furniture in walking pathways o ICE to the affected joint every three hours while awake for 30 minutes at a time, for at least the first 3-5 days, and then as needed for pain and swelling.  Continue to use ice for pain and swelling. You may notice swelling that will progress down to the foot and ankle.  This is normal after surgery.  Elevate your leg when you are not up walking on it.   o Continue to use the breathing machine you got in the hospital (incentive spirometer) which will help keep your temperature down.  It is common for your temperature to cycle up and down following surgery, especially at night when you are not up moving around and exerting yourself.  The breathing machine keeps your lungs expanded and your temperature down.   DIET:  As you were doing prior to hospitalization, we recommend a well-balanced diet.  DRESSING / WOUND CARE / SHOWERING  You may change your dressing 3-5 days after surgery.  Then change the dressing every day with sterile gauze.  Please use good hand washing techniques before changing the dressing.  Do not use any lotions or creams on the incision until instructed by your surgeon.  ACTIVITY  o Increase activity slowly as tolerated, but follow the weight bearing instructions below.   o No driving for 6 weeks or until further direction given by your physician.  You cannot drive while taking narcotics.  o No lifting or carrying greater than 10 lbs. until further directed by your surgeon. o Avoid periods of inactivity such as sitting longer than an hour when not asleep. This helps prevent blood clots.  o You may return to work once you are authorized by your doctor.     WEIGHT BEARING   Weight bearing as tolerated with assist device (walker, cane, etc) as directed, use it as long as suggested by your surgeon or therapist, typically at least 4-6  weeks.   EXERCISES  Results after joint replacement surgery are often greatly improved when you follow the exercise, range of motion and muscle strengthening exercises prescribed by your doctor. Safety measures are also important to protect the joint from further injury. Any time any of these exercises cause you to have increased pain or swelling, decrease what you are doing until you are comfortable again and then slowly increase them. If you have problems or questions, call your caregiver or physical therapist for advice.   Rehabilitation is important following a joint replacement. After just a few days of immobilization, the muscles of the leg can become weakened and shrink (atrophy).  These exercises are designed to build up the tone and strength of the thigh and leg  muscles and to improve motion. Often times heat used for twenty to thirty minutes before working out will loosen up your tissues and help with improving the range of motion but do not use heat for the first two weeks following surgery (sometimes heat can increase post-operative swelling).   These exercises can be done on a training (exercise) mat, on the floor, on a table or on a bed. Use whatever works the best and is most comfortable for you.    Use music or television while you are exercising so that the exercises are a pleasant break in your day. This will make your life better with the exercises acting as a break in your routine that you can look forward to.   Perform all exercises about fifteen times, three times per day or as directed.  You should exercise both the operative leg and the other leg as well.  Exercises include:    Quad Sets - Tighten up the muscle on the front of the thigh (Quad) and hold for 5-10 seconds.    Straight Leg Raises - With your knee straight (if you were given a brace, keep it on), lift the leg to 60 degrees, hold for 3 seconds, and slowly lower the leg.  Perform this exercise against resistance  later as your leg gets stronger.   Leg Slides: Lying on your back, slowly slide your foot toward your buttocks, bending your knee up off the floor (only go as far as is comfortable). Then slowly slide your foot back down until your leg is flat on the floor again.   Angel Wings: Lying on your back spread your legs to the side as far apart as you can without causing discomfort.   Hamstring Strength:  Lying on your back, push your heel against the floor with your leg straight by tightening up the muscles of your buttocks.  Repeat, but this time bend your knee to a comfortable angle, and push your heel against the floor.  You may put a pillow under the heel to make it more comfortable if necessary.   A rehabilitation program following joint replacement surgery can speed recovery and prevent re-injury in the future due to weakened muscles. Contact your doctor or a physical therapist for more information on knee rehabilitation.    CONSTIPATION  Constipation is defined medically as fewer than three stools per week and severe constipation as less than one stool per week.  Even if you have a regular bowel pattern at home, your normal regimen is likely to be disrupted due to multiple reasons following surgery.  Combination of anesthesia, postoperative narcotics, change in appetite and fluid intake all can affect your bowels.   YOU MUST use at least one of the following options; they are listed in order of increasing strength to get the job done.  They are all available over the counter, and you may need to use some, POSSIBLY even all of these options:    Drink plenty of fluids (prune juice may be helpful) and high fiber foods Colace 100 mg by mouth twice a day  Senokot for constipation as directed and as needed Dulcolax (bisacodyl), take with full glass of water  Miralax (polyethylene glycol) once or twice a day as needed.  If you have tried all these things and are unable to have a bowel movement in the  first 3-4 days after surgery call either your surgeon or your primary doctor.    If you experience loose stools or diarrhea,  hold the medications until you stool forms back up.  If your symptoms do not get better within 1 week or if they get worse, check with your doctor.  If you experience "the worst abdominal pain ever" or develop nausea or vomiting, please contact the office immediately for further recommendations for treatment.   ITCHING:  If you experience itching with your medications, try taking only a single pain pill, or even half a pain pill at a time.  You can also use Benadryl over the counter for itching or also to help with sleep.   TED HOSE STOCKINGS:  Use stockings on both legs until for at least 2 weeks or as directed by physician office. They may be removed at night for sleeping.  MEDICATIONS:  See your medication summary on the After Visit Summary that nursing will review with you.  You may have some home medications which will be placed on hold until you complete the course of blood thinner medication.  It is important for you to complete the blood thinner medication as prescribed.  PRECAUTIONS:  If you experience chest pain or shortness of breath - call 911 immediately for transfer to the hospital emergency department.   If you develop a fever greater that 101 F, purulent drainage from wound, increased redness or drainage from wound, foul odor from the wound/dressing, or calf pain - CONTACT YOUR SURGEON.                                                   FOLLOW-UP APPOINTMENTS:  If you do not already have a post-op appointment, please call the office for an appointment to be seen by your surgeon.  Guidelines for how soon to be seen are listed in your After Visit Summary, but are typically between 1-4 weeks after surgery.  OTHER INSTRUCTIONS:   Knee Replacement:  Do not place pillow under knee, focus on keeping the knee straight while resting. CPM instructions: 0-90 degrees,  2 hours in the morning, 2 hours in the afternoon, and 2 hours in the evening. Place foam block, curve side up under heel at all times except when in CPM or when walking.  DO NOT modify, tear, cut, or change the foam block in any way.  MAKE SURE YOU:   Understand these instructions.   Get help right away if you are not doing well or get worse.    Thank you for letting us be a part of your medical care team.  It is a privilege we respect greatly.  We hope these instructions will help you stay on track for a fast and full recovery!

## 2014-04-26 LAB — BASIC METABOLIC PANEL
Anion gap: 8 (ref 5–15)
BUN: 12 mg/dL (ref 6–23)
CALCIUM: 8.8 mg/dL (ref 8.4–10.5)
CO2: 28 mmol/L (ref 19–32)
Chloride: 99 mmol/L (ref 96–112)
Creatinine, Ser: 0.94 mg/dL (ref 0.50–1.35)
GLUCOSE: 145 mg/dL — AB (ref 70–99)
Potassium: 3.8 mmol/L (ref 3.5–5.1)
Sodium: 135 mmol/L (ref 135–145)

## 2014-04-26 LAB — CBC
HEMATOCRIT: 36.1 % — AB (ref 39.0–52.0)
HEMOGLOBIN: 12.1 g/dL — AB (ref 13.0–17.0)
MCH: 30.7 pg (ref 26.0–34.0)
MCHC: 33.5 g/dL (ref 30.0–36.0)
MCV: 91.6 fL (ref 78.0–100.0)
Platelets: 294 10*3/uL (ref 150–400)
RBC: 3.94 MIL/uL — AB (ref 4.22–5.81)
RDW: 13.3 % (ref 11.5–15.5)
WBC: 15.6 10*3/uL — AB (ref 4.0–10.5)

## 2014-04-26 LAB — GLUCOSE, CAPILLARY
Glucose-Capillary: 121 mg/dL — ABNORMAL HIGH (ref 70–99)
Glucose-Capillary: 96 mg/dL (ref 70–99)
Glucose-Capillary: 80 mg/dL (ref 70–99)

## 2014-04-26 NOTE — Consult Note (Addendum)
   [facility] CM Inpatient Consult   04/26/2014  [name] [birth year] [id number]   Came to visit patient at bedside on behalf of [employer] employees/dependents with [facility]. Patient reports he is familiar with the program and would like further follow up with [facility] Coordinator for DM management. Will request for patient to be called for appointment with [facility] Coordinator. Left [employer] packet, brochure and contact information with patient. Made inpatient RNCM aware.   Marthenia Rolling, MSN-Ed, RN,BSN [facility] Liaison [phone]

## 2014-04-26 NOTE — Discharge Summary (Addendum)
Physician Discharge Summary  Patient ID: [name] MRN: [id number] DOB/AGE: 03-11-1970 44 y.o.  Admit date: 04/24/2014 Discharge date: 04/27/2014  Admission Diagnoses:  Primary localized osteoarthritis of left hip  Discharge Diagnoses:  Principal Problem:   Primary localized osteoarthritis of left hip   Past Medical History  Diagnosis Date  . Upper GI bleed   . Ventral hernia   . Hiatal hernia   . History of blood clots 2012    in abdomen  . Dizziness     occasionally  . Joint pain     knees  . GERD (gastroesophageal reflux disease)     takes Protonix bid  . Gastric ulcer     history of  . Constipation     related to pain meds  . Nocturia     depends on amount of fluid he drinks  . History of blood transfusion 2012  . Anxiety     takes Xanax daily prn  . External hemorrhoid, bleeding   . Diabetes mellitus without complication   . Primary localized osteoarthritis of left hip 04/24/2014    Surgeries: Procedure(s): LEFT TOTAL HIP ARTHROPLASTY on 04/24/2014   Consultants (if any):    Discharged Condition: Improved  Hospital Course: [name] is an 44 y.o. male who was admitted 04/24/2014 with a diagnosis of Primary localized osteoarthritis of left hip and went to the operating room on 04/24/2014 and underwent the above named procedures.    He was given perioperative antibiotics:  Anti-infectives    Start     Dose/Rate Route Frequency Ordered Stop   04/24/14 1815  ceFAZolin (ANCEF) IVPB 2 g/50 mL premix     2 g 100 mL/hr over 30 Minutes Intravenous Every 6 hours 04/24/14 1804 04/25/14 0035   04/24/14 0600  ceFAZolin (ANCEF) IVPB 2 g/50 mL premix     2 g 100 mL/hr over 30 Minutes Intravenous On call to O.R. 04/23/14 1437 04/24/14 0935    .  He was given sequential compression devices, early ambulation, and xarelto for DVT prophylaxis.  He benefited maximally from the hospital stay and there were no complications.  He stayed in the hospital until pain  well controlled with oral medications, and he was confident with ambulatory safety with the walker.  Recent vital signs:  Filed Vitals:   04/26/14 0604  BP: 117/69  Pulse: 94  Temp: 98.9 F (37.2 C)  Resp: 16    Recent laboratory studies:  Lab Results  Component Value Date   HGB 12.1* 04/26/2014   HGB 13.2 04/25/2014   HGB 15.5 04/12/2014   Lab Results  Component Value Date   WBC 15.6* 04/26/2014   PLT 294 04/26/2014   Lab Results  Component Value Date   INR 0.96 04/12/2014   Lab Results  Component Value Date   NA 135 04/26/2014   K 3.8 04/26/2014   CL 99 04/26/2014   CO2 28 04/26/2014   BUN 12 04/26/2014   CREATININE 0.94 04/26/2014   GLUCOSE 145* 04/26/2014    Discharge Medications:     Medication List    STOP taking these medications        acetaminophen 500 MG tablet  Commonly known as:  TYLENOL     HYDROcodone-acetaminophen 5-325 MG per tablet  Commonly known as:  NORCO/VICODIN     metFORMIN 500 MG tablet  Commonly known as:  GLUCOPHAGE     sildenafil 100 MG tablet  Commonly known as:  VIAGRA  TAKE these medications        baclofen 10 MG tablet  Commonly known as:  LIORESAL  Take 1 tablet (10 mg total) by mouth 3 (three) times daily. As needed for muscle spasm     busPIRone 30 MG tablet  Commonly known as:  BUSPAR  Take 1 tablet (30 mg total) by mouth 2 (two) times daily.     clonazePAM 0.25 MG disintegrating tablet  Commonly known as:  KLONOPIN  Take 1 tablet (0.25 mg total) by mouth at bedtime as needed (insomnia, panic attack).     ondansetron 4 MG tablet  Commonly known as:  ZOFRAN  Take 1 tablet (4 mg total) by mouth every 8 (eight) hours as needed for nausea or vomiting.     oxyCODONE-acetaminophen 10-325 MG per tablet  Commonly known as:  PERCOCET  Take 1-2 tablets by mouth every 6 (six) hours as needed for pain. MAXIMUM TOTAL ACETAMINOPHEN DOSE IS 4000 MG PER DAY     pantoprazole 40 MG tablet  Commonly known as:   PROTONIX  Take 1 tablet (40 mg total) by mouth 2 (two) times daily.     rivaroxaban 10 MG Tabs tablet  Commonly known as:  XARELTO  Take 1 tablet (10 mg total) by mouth daily.     sennosides-docusate sodium 8.6-50 MG tablet  Commonly known as:  SENOKOT-S  Take 2 tablets by mouth daily.     zolpidem 10 MG tablet  Commonly known as:  AMBIEN  TAKE 1 TABLET BY MOUTH AT BEDTIME  PATIENT NEEDS OFFICE VISIT        Diagnostic Studies: Dg Pelvis Portable  04/24/2014   CLINICAL DATA:  Status post left hip replacement  EXAM: PORTABLE PELVIS 1-2 VIEWS  COMPARISON:  None.  FINDINGS: A left hip replacement is noted. No acute bony or soft tissue abnormality is seen.  IMPRESSION: No acute abnormality noted.   Electronically Signed   By: Inez Catalina M.D.   On: 04/24/2014 13:43    Disposition: 01-Home or Self Care        Follow-up Information    Follow up with Johnny Bridge, MD. Schedule an appointment as soon as possible for a visit in 2 weeks.   Specialty:  Orthopedic Surgery   Contact information:   [place] [address] [phone]        Signed: Johnny Bridge 04/26/2014, 1:36 PM

## 2014-04-26 NOTE — Progress Notes (Signed)
Occupational Therapy Treatment Patient Details Name: [name] MRN: [id number] DOB: [birth year] Today's Date: 04/26/2014    History of present illness Pt admitted 3/22 for elective L THA posterior approach. Pt with h/o hernia and anxiety.   OT comments  Patient progressing nicely towards goals, continue plan of care for now.   Follow Up Recommendations  No OT follow up;Supervision/Assistance - 24 hour    Equipment Recommendations  3 in 1 bedside comode;Other (comment) (AE - reacher, sock aid, LH sponge, LH shoe horn)    Recommendations for Other Services  None at this time  Precautions / Restrictions Precautions Precautions: Posterior Hip Precaution Comments: reviewed 3/3 precautions Restrictions Weight Bearing Restrictions: Yes LLE Weight Bearing: Weight bearing as tolerated     Mobility Bed Mobility Overal bed mobility: Needs Assistance Bed Mobility: Supine to Sit       Sit to supine: Min guard   General bed mobility comments: no rails and HOB lowered  Transfers Overall transfer level: Needs assistance Equipment used: Rolling walker (2 wheeled) Transfers: Sit to/from Stand Sit to Stand: Supervision    General transfer comment: cues for safety    Balance Overall balance assessment: Needs assistance Sitting-balance support: Feet supported;No upper extremity supported Sitting balance-Leahy Scale: Good     Standing balance support: Bilateral upper extremity supported;During functional activity Standing balance-Leahy Scale: Fair   ADL   General ADL Comments: Patient found supine in bed. Patient engaged in bed mobility with min guard assist, no rails and HOB lowered. Patient stood with supervision and ambulated > therapy gym. Tub/shower transfer simulated for [facility] transfer. Educated patient on walk-in shower transfers as well. Patient states he plans to go upstairs for sleeping and showers. Patient states he will have brother to assist 24/7     Cognition    Behavior During Therapy: WFL for tasks assessed/performed Overall Cognitive Status: Within Functional Limits for tasks assessed                Pertinent Vitals/ Pain       Pain Assessment: 0-10 Pain Score: 7  Pain Location: left hip Pain Descriptors / Indicators: Constant Pain Intervention(s): Monitored during session;Premedicated before session;Repositioned (pt stated ambulation helped with pain)         Frequency Min 2X/week     Progress Toward Goals  OT Goals(current goals can now be found in the care plan section)  Progress towards OT goals: Progressing toward goals     Plan Discharge plan remains appropriate       End of Session Equipment Utilized During Treatment: Rolling walker   Activity Tolerance Patient tolerated treatment well   Patient Left Other (comment) (up with PT)     Time: 2263-3354 OT Time Calculation (min): 21 min  Charges: OT General Charges $OT Visit: 1 Procedure OT Treatments $Self Care/Home Management : 8-22 mins  Deyra Perdomo , MS, OTR/L, CLT Pager: [phone]  04/26/2014, 11:03 AM

## 2014-04-26 NOTE — Progress Notes (Signed)
Physical Therapy Treatment Patient Details Name: [name] MRN: [id number] DOB: [birth year] Today's Date: 04/26/2014    History of Present Illness Pt admitted 3/22 for elective L THA posterior approach. Pt with h/o hernia and anxiety.    PT Comments    Pt ambulated 250 ft w/ min verbal cues to stand upright, pt demonstrated understanding.  Pt continues to request to stay another night, Dr. Mardelle Matte visited pt during session and agreed with this plan.  Pt continues to progress well w/ PT exercises.   Follow Up Recommendations  Home health PT;Supervision/Assistance - 24 hour     Equipment Recommendations  Rolling walker with 5" wheels;3in1 (PT)    Recommendations for Other Services       Precautions / Restrictions Precautions Precautions: Fall;Posterior Hip Precaution Comments: reviewed 3/3 precautions; pt remembered 3/3 precautions Restrictions Weight Bearing Restrictions: Yes LLE Weight Bearing: Weight bearing as tolerated    Mobility  Bed Mobility Overal bed mobility: Modified Independent Bed Mobility: Sit to Supine       Sit to supine: Modified independent (Device/Increase time)   General bed mobility comments: use of rails when scooting HOB  Transfers Overall transfer level: Needs assistance Equipment used: Rolling walker (2 wheeled) Transfers: Sit to/from Stand Sit to Stand: Modified independent (Device/Increase time)         General transfer comment: increased time  Ambulation/Gait Ambulation/Gait assistance: Modified independent (Device/Increase time) Ambulation Distance (Feet): 250 Feet Assistive device: Rolling walker (2 wheeled) Gait Pattern/deviations: Step-through pattern;Decreased stride length;Trunk flexed;Antalgic   Gait velocity interpretation: Below normal speed for age/gender General Gait Details: v/c's for slightly flexed posture   Stairs Stairs: Yes Stairs assistance: Min guard Stair Management: One rail Right;Step to  pattern;Sideways Number of Stairs: 2 (x2)    Wheelchair Mobility    Modified Rankin (Stroke Patients Only)       Balance Overall balance assessment: Needs assistance Sitting-balance support: No upper extremity supported;Feet supported Sitting balance-Leahy Scale: Good     Standing balance support: No upper extremity supported Standing balance-Leahy Scale: Fair Standing balance comment: Pt able to stand w/o holding onto RW but requires RW for ambulation                    Cognition Arousal/Alertness: Awake/alert Behavior During Therapy: WFL for tasks assessed/performed Overall Cognitive Status: Within Functional Limits for tasks assessed                      Exercises Total Joint Exercises Ankle Circles/Pumps: AROM;Both;10 reps;Seated Short Arc Quad: AROM;Left;10 reps;Seated Hip ABduction/ADduction: AROM;Left;10 reps;Seated Long Arc Quad: AROM;Left;10 reps;Seated Knee Flexion: AROM;Standing;10 reps;Left Standing Hip Extension: AROM;10 reps;Left;Standing    General Comments        Pertinent Vitals/Pain Pain Assessment: 0-10 Pain Score: 4  Pain Location: L hip Pain Descriptors / Indicators: Dull (Stiff) Pain Intervention(s): Limited activity within patient's tolerance;Monitored during session;Repositioned    Home Living                      Prior Function            PT Goals (current goals can now be found in the care plan section) Acute Rehab PT Goals Patient Stated Goal: to go home tomorrow Progress towards PT goals: Progressing toward goals    Frequency  7X/week    PT Plan Current plan remains appropriate    Co-evaluation             End of  Session Equipment Utilized During Treatment: Gait belt Activity Tolerance: Patient tolerated treatment well Patient left: in bed;with call bell/phone within reach     Time: 1325-1355 PT Time Calculation (min) (ACUTE ONLY): 30 min  Charges:  $Gait Training: 23-37  mins $Therapeutic Exercise: 8-22 mins                    G CodesJoslyn Hy PT, [place] [phone] #[id number] 04/26/2014, 2:04 PM

## 2014-04-26 NOTE — Progress Notes (Signed)
Physical Therapy Treatment Patient Details Name: [name] MRN: [id number] DOB: 20-Jun-1970 Today's Date: 04/26/2014    History of Present Illness Pt admitted 3/22 for elective L THA posterior approach. Pt with h/o hernia and anxiety.    PT Comments    Pt ambulated 200 ft and ascended/descended 4 steps today.  PT is progressing well w/ therapy and therapy goals.  Pt expressed desire to stay another night and PT relayed this to RN.    Follow Up Recommendations  Home health PT;Supervision/Assistance - 24 hour     Equipment Recommendations  Rolling walker with 5" wheels;3in1 (PT)    Recommendations for Other Services       Precautions / Restrictions Precautions Precautions: Fall;Posterior Hip Precaution Comments: reviewed 3/3 precautions; pt remembered 3/3 precautions Restrictions Weight Bearing Restrictions: Yes LLE Weight Bearing: Weight bearing as tolerated    Mobility  Bed Mobility Overal bed mobility: Needs Assistance Bed Mobility: Supine to Sit       Sit to supine: Min guard   General bed mobility comments: no rails and HOB lowered  Transfers Overall transfer level: Needs assistance Equipment used: Rolling walker (2 wheeled) Transfers: Sit to/from Stand Sit to Stand: Supervision         General transfer comment: supervision for safety  Ambulation/Gait Ambulation/Gait assistance: Modified independent (Device/Increase time) Ambulation Distance (Feet): 200 Feet Assistive device: Rolling walker (2 wheeled) Gait Pattern/deviations: Step-to pattern;Step-through pattern;Antalgic;Decreased stride length;Decreased step length - left   Gait velocity interpretation: Below normal speed for age/gender General Gait Details: v/c's for slightly flexed posture   Stairs Stairs: Yes Stairs assistance: Min guard Stair Management: One rail Right;Step to pattern;Sideways Number of Stairs: 2 (x2)    Wheelchair Mobility    Modified Rankin (Stroke Patients Only)       Balance Overall balance assessment: Needs assistance Sitting-balance support: No upper extremity supported;Feet supported Sitting balance-Leahy Scale: Good     Standing balance support: Bilateral upper extremity supported Standing balance-Leahy Scale: Fair                      Cognition Arousal/Alertness: Awake/alert Behavior During Therapy: WFL for tasks assessed/performed Overall Cognitive Status: Within Functional Limits for tasks assessed                      Exercises Total Joint Exercises Short Arc Quad: AROM;Left;10 reps;Seated Hip ABduction/ADduction: AROM;Left;10 reps;Seated Long Arc Quad: AROM;Left;10 reps;Seated    General Comments        Pertinent Vitals/Pain Pain Assessment: 0-10 Pain Score: 7  Pain Location: L hip Pain Descriptors / Indicators: Aching;Grimacing;Dull;Discomfort;Nagging Pain Intervention(s): Limited activity within patient's tolerance;Monitored during session;Repositioned    Home Living                      Prior Function            PT Goals (current goals can now be found in the care plan section) Acute Rehab PT Goals Patient Stated Goal: go home tomorrow Progress towards PT goals: Progressing toward goals    Frequency  7X/week    PT Plan Current plan remains appropriate    Co-evaluation             End of Session Equipment Utilized During Treatment: Gait belt Activity Tolerance: Patient tolerated treatment well Patient left: in chair;with call bell/phone within reach;with family/visitor present     Time: 4166-0630 PT Time Calculation (min) (ACUTE ONLY): 45 min  Charges:  $  Gait Training: 23-37 mins $Therapeutic Exercise: 8-22 mins                    G CodesJoslyn Hy PT, [place] [phone] #[id number] 04/26/2014, 11:44 AM

## 2014-04-26 NOTE — Progress Notes (Signed)
Patient ID: [name], male   DOB: 1970/02/04, 44 y.o.   MRN: [id number]     Subjective:  Patient reports pain as mild.  Patient denies any CP or SOB  Objective:   VITALS:   Filed Vitals:   04/25/14 0500 04/25/14 1227 04/25/14 2045 04/26/14 0604  BP: 107/62 117/73 141/83 117/69  Pulse: 73 117 119 94  Temp: 98.6 F (37 C) 98.8 F (37.1 C) 100 F (37.8 C) 98.9 F (37.2 C)  TempSrc:    Oral  Resp: 16 18 18 16   Height:      Weight:      SpO2: 98% 97% 97% 97%    ABD soft Sensation intact distally Dorsiflexion/Plantar flexion intact Incision: dressing C/D/I and no drainage Good knee, foot and ankle motion   Lab Results  Component Value Date   WBC 15.6* 04/26/2014   HGB 12.1* 04/26/2014   HCT 36.1* 04/26/2014   MCV 91.6 04/26/2014   PLT 294 04/26/2014   BMET    Component Value Date/Time   NA 135 04/26/2014 0555   K 3.8 04/26/2014 0555   CL 99 04/26/2014 0555   CO2 28 04/26/2014 0555   GLUCOSE 145* 04/26/2014 0555   BUN 12 04/26/2014 0555   CREATININE 0.94 04/26/2014 0555   CALCIUM 8.8 04/26/2014 0555   GFRNONAA >90 04/26/2014 0555   GFRAA >90 04/26/2014 0555     Assessment/Plan: 2 Days Post-Op   Principal Problem:   Primary localized osteoarthritis of left hip   Advance diet Up with therapy Discharge home with home health WBAT Dry dressing PRN Follow up with Dr Mardelle Matte in two weeks    Rande Brunt, Erlene Quan 04/26/2014, 7:10 AM  Seen and agree with above.  Patient doesn't feel confident with safety, and having pain issues as well, and wants to stay one more day.   Plan dc home tomorrow.   Marchia Bond, MD Cell [phone]

## 2014-04-27 LAB — GLUCOSE, CAPILLARY
Glucose-Capillary: 109 mg/dL — ABNORMAL HIGH (ref 70–99)
Glucose-Capillary: 107 mg/dL — ABNORMAL HIGH (ref 70–99)

## 2014-04-27 LAB — CBC
HCT: 34.1 % — ABNORMAL LOW (ref 39.0–52.0)
Hemoglobin: 11.4 g/dL — ABNORMAL LOW (ref 13.0–17.0)
MCH: 30.7 pg (ref 26.0–34.0)
MCHC: 33.4 g/dL (ref 30.0–36.0)
MCV: 91.9 fL (ref 78.0–100.0)
Platelets: 270 10*3/uL (ref 150–400)
RBC: 3.71 MIL/uL — ABNORMAL LOW (ref 4.22–5.81)
RDW: 13.5 % (ref 11.5–15.5)
WBC: 10 10*3/uL (ref 4.0–10.5)

## 2014-04-27 MED ORDER — BISACODYL 10 MG RE SUPP
10.0000 mg | Freq: Once | RECTAL | Status: AC
  Administered 2014-04-27: 10 mg via RECTAL
  Filled 2014-04-27: qty 1

## 2014-04-27 NOTE — Progress Notes (Signed)
Occupational Therapy Treatment Patient Details Name: [name] MRN: [id number] DOB: 25-Feb-1970 Today's Date: 04/27/2014    History of present illness Pt admitted 3/22 for elective L THA posterior approach. Pt with h/o hernia and anxiety.   OT comments  Completed all education regarding ADL education. Pt ready to DC home when medically stable. OT goals met.   Follow Up Recommendations  No OT follow up;Supervision/Assistance - 24 hour    Equipment Recommendations  3 in 1 bedside comode;Other (comment)    Recommendations for Other Services      Precautions / Restrictions Precautions Precautions: Fall;Posterior Hip Precaution Comments: reviewed 3/3 precautions; pt remembered 3/3 precautions Restrictions Weight Bearing Restrictions: Yes LLE Weight Bearing: Weight bearing as tolerated       Mobility                   Transfers Overall transfer level: Modified independent Equipment used: Rolling walker (2 wheeled) Transfers: Sit to/from Stand Sit to Stand: Modified independent (Device/Increase time)         General transfer comment: increased time; bringing LLE out in front prior to sitting down    Balance Overall balance assessment:  Sitting-balance support: No upper extremity supported;Feet supported Sitting balance-Leahy Scale: Good     Standing balance support: Bilateral upper extremity supported Standing balance-Leahy Scale: Fair                     ADL                                         General ADL Comments: Completed education regarding compensatory techniques anduse of AE for ADL. Pt ambulated to gym to complete tub and shower transfers. Denostrated use of 3 in 1 in tub and walk in shower. Pt return demonstrated understanding.                                       Cognition   Behavior During Therapy: WFL for tasks assessed/performed Overall Cognitive Status: Within Functional Limits for tasks  assessed                                                      Pertinent Vitals/ Pain       Pain Assessment: 0-10 Pain Score: 3  Pain Location: L hip Pain Descriptors / Indicators: Sore Pain Intervention(s): Limited activity within patient's tolerance;Monitored during session;Repositioned  Home Living                                          Prior Functioning/Environment              Frequency       Progress Toward Goals  OT Goals(current goals can now be found in the care plan section)  Progress towards OT goals: Goals met/education completed, patient discharged from OT  Acute Rehab OT Goals Patient Stated Goal: to go home today OT Goal Formulation: With patient/family Time For Goal Achievement: 05/02/14 Potential to Achieve Goals: Good ADL Goals Pt Will Perform  Grooming: with modified independence;standing Pt Will Perform Lower Body Bathing: with set-up;sit to/from stand;with adaptive equipment Pt Will Perform Lower Body Dressing: with set-up;with adaptive equipment;sit to/from stand Pt Will Transfer to Toilet: with modified independence;ambulating;bedside commode Pt Will Perform Tub/Shower Transfer: with supervision;Shower transfer;rolling walker;3 in 1;ambulating Additional ADL Goal #1: Patient will independently verbalize and adhere to 3/3 posterior hip precautions  Plan Discharge plan remains appropriate    Co-evaluation                 End of Session Equipment Utilized During Treatment: Gait belt;Rolling walker   Activity Tolerance Patient tolerated treatment well   Patient Left in chair;with call bell/phone within reach;with family/visitor present   Nurse Communication Mobility status        Time: 1035-1100 OT Time Calculation (min): 25 min  Charges: OT General Charges $OT Visit: 1 Procedure OT Treatments $Self Care/Home Management : 23-37 mins  Elita Dame,H[place] 04/27/2014, 11:15 AM   Maurie Boettcher,  OTR/L  [phone] 04/27/2014

## 2014-04-27 NOTE — Progress Notes (Signed)
Physical Therapy Treatment Patient Details Name: CAGNEY DEGRACE MRN: [id number] DOB: [birth year] Today's Date: 04/27/2014    History of Present Illness Pt admitted 3/22 for elective L THA posterior approach. Pt with h/o hernia and anxiety.    PT Comments    Pt ambulated 250 ft w/ v/c's to stand upright and to keep RW closer to body while ambulating.  Pt reports improvement w/ L hip stiffness following therapy session.  Pt is anticipating d/c to home w/ HHPT today.  Follow Up Recommendations  Home health PT;Supervision/Assistance - 24 hour     Equipment Recommendations  Rolling walker with 5" wheels;3in1 (PT)    Recommendations for Other Services       Precautions / Restrictions Precautions Precautions: Fall;Posterior Hip Precaution Comments: reviewed 3/3 precautions; pt remembered 3/3 precautions Restrictions Weight Bearing Restrictions: Yes LLE Weight Bearing: Weight bearing as tolerated    Mobility  Bed Mobility                  Transfers Overall transfer level: Modified independent Equipment used: Rolling walker (2 wheeled) Transfers: Sit to/from Stand Sit to Stand: Modified independent (Device/Increase time)         General transfer comment: increased time; bringing LLE out in front prior to sitting down  Ambulation/Gait Ambulation/Gait assistance: Modified independent (Device/Increase time) Ambulation Distance (Feet): 250 Feet Assistive device: Rolling walker (2 wheeled) Gait Pattern/deviations: Step-through pattern;Trunk flexed;Antalgic;Decreased stride length Gait velocity: slow Gait velocity interpretation: Below normal speed for age/gender General Gait Details: v/c's for slightly flexed posture and to keep RW closer to body during ambulation   Stairs            Wheelchair Mobility    Modified Rankin (Stroke Patients Only)       Balance Overall balance assessment: Needs assistance Sitting-balance support: No upper extremity  supported;Feet supported Sitting balance-Leahy Scale: Good     Standing balance support: Bilateral upper extremity supported Standing balance-Leahy Scale: Fair                      Cognition Arousal/Alertness: Awake/alert Behavior During Therapy: WFL for tasks assessed/performed Overall Cognitive Status: Within Functional Limits for tasks assessed                      Exercises Total Joint Exercises Ankle Circles/Pumps: AROM;Both;10 reps;Seated Long Arc Quad: AROM;Left;10 reps;Seated    General Comments General comments (skin integrity, edema, etc.): Pt reported decrease in stiffness in L hip following session      Pertinent Vitals/Pain Pain Assessment: 0-10 Pain Score: 3  Pain Location: L hip Pain Descriptors / Indicators: Sore Pain Intervention(s): Limited activity within patient's tolerance;Monitored during session;Repositioned    Home Living                      Prior Function            PT Goals (current goals can now be found in the care plan section) Acute Rehab PT Goals Patient Stated Goal: to go home today Progress towards PT goals: Progressing toward goals    Frequency  7X/week    PT Plan Current plan remains appropriate    Co-evaluation             End of Session Equipment Utilized During Treatment: Gait belt Activity Tolerance: Patient tolerated treatment well Patient left: in chair;with call bell/phone within reach     Time: 1696-7893 PT Time Calculation (min) (ACUTE ONLY): 22  min  Charges:  $Gait Training: 8-22 mins                    G CodesJoslyn Hy PT, [place] [phone] #[id number] 04/27/2014, 9:54 AM

## 2014-04-27 NOTE — Progress Notes (Signed)
[name] to be D/C'd Home per MD order. Discussed with the patient and all questions fully answered.    Medication List    STOP taking these medications        acetaminophen 500 MG tablet  Commonly known as:  TYLENOL     HYDROcodone-acetaminophen 5-325 MG per tablet  Commonly known as:  NORCO/VICODIN     metFORMIN 500 MG tablet  Commonly known as:  GLUCOPHAGE     sildenafil 100 MG tablet  Commonly known as:  VIAGRA      TAKE these medications        baclofen 10 MG tablet  Commonly known as:  LIORESAL  Take 1 tablet (10 mg total) by mouth 3 (three) times daily. As needed for muscle spasm     busPIRone 30 MG tablet  Commonly known as:  BUSPAR  Take 1 tablet (30 mg total) by mouth 2 (two) times daily.     clonazePAM 0.25 MG disintegrating tablet  Commonly known as:  KLONOPIN  Take 1 tablet (0.25 mg total) by mouth at bedtime as needed (insomnia, panic attack).     ondansetron 4 MG tablet  Commonly known as:  ZOFRAN  Take 1 tablet (4 mg total) by mouth every 8 (eight) hours as needed for nausea or vomiting.     oxyCODONE-acetaminophen 10-325 MG per tablet  Commonly known as:  PERCOCET  Take 1-2 tablets by mouth every 6 (six) hours as needed for pain. MAXIMUM TOTAL ACETAMINOPHEN DOSE IS 4000 MG PER DAY     pantoprazole 40 MG tablet  Commonly known as:  PROTONIX  Take 1 tablet (40 mg total) by mouth 2 (two) times daily.     rivaroxaban 10 MG Tabs tablet  Commonly known as:  XARELTO  Take 1 tablet (10 mg total) by mouth daily.     sennosides-docusate sodium 8.6-50 MG tablet  Commonly known as:  SENOKOT-S  Take 2 tablets by mouth daily.     zolpidem 10 MG tablet  Commonly known as:  AMBIEN  TAKE 1 TABLET BY MOUTH AT BEDTIME  PATIENT NEEDS OFFICE VISIT        VVS, Skin clean, dry and intact without evidence of skin break down, no evidence of skin tears noted.  IV catheter discontinued intact. Site without signs and symptoms of complications. Dressing and  pressure applied.  An After Visit Summary was printed and given to the patient.  Patient escorted via [place], and D/C home via private auto.  Cyndra Numbers  04/27/2014 1:32 PM

## 2014-04-30 ENCOUNTER — Encounter: Payer: Self-pay | Admitting: Family Medicine

## 2014-05-08 ENCOUNTER — Other Ambulatory Visit: Payer: Self-pay

## 2014-05-09 ENCOUNTER — Other Ambulatory Visit: Payer: Self-pay | Admitting: Family Medicine

## 2014-05-09 MED ORDER — CLONAZEPAM 0.25 MG PO TBDP: 0.2500 mg | ORAL_TABLET | Freq: Every evening | ORAL | Status: DC | PRN

## 2014-05-09 NOTE — Telephone Encounter (Signed)
Last f/u appt 12/2013 

## 2014-05-10 NOTE — Telephone Encounter (Signed)
Rx called in to requested pharmacy 

## 2014-05-16 ENCOUNTER — Encounter: Payer: Self-pay | Admitting: Family Medicine

## 2014-05-16 ENCOUNTER — Other Ambulatory Visit: Payer: Self-pay | Admitting: Family Medicine

## 2014-05-16 MED ORDER — ZOLPIDEM TARTRATE 10 MG PO TABS: ORAL_TABLET | ORAL | Status: DC

## 2014-05-16 NOTE — Telephone Encounter (Signed)
Last f/u appt 12/2013 

## 2014-05-16 NOTE — Telephone Encounter (Signed)
Rx called in to requested pharmacy 

## 2014-05-18 ENCOUNTER — Other Ambulatory Visit: Payer: Self-pay

## 2014-05-25 ENCOUNTER — Encounter: Payer: Self-pay | Admitting: Physical Therapy

## 2014-05-25 ENCOUNTER — Ambulatory Visit: Payer: 59 | Attending: Orthopedic Surgery | Admitting: Physical Therapy

## 2014-05-25 DIAGNOSIS — M6281 Muscle weakness (generalized): Secondary | ICD-10-CM | POA: Insufficient documentation

## 2014-05-25 DIAGNOSIS — M2560 Stiffness of unspecified joint, not elsewhere classified: Secondary | ICD-10-CM | POA: Insufficient documentation

## 2014-05-25 DIAGNOSIS — Z9660 Presence of unspecified orthopedic joint implant: Secondary | ICD-10-CM | POA: Insufficient documentation

## 2014-05-25 DIAGNOSIS — R262 Difficulty in walking, not elsewhere classified: Secondary | ICD-10-CM | POA: Diagnosis not present

## 2014-05-25 DIAGNOSIS — Z96642 Presence of left artificial hip joint: Secondary | ICD-10-CM

## 2014-05-25 NOTE — Therapy (Signed)
[place], [place], [address] Phone: [phone]   Fax:  [phone]  Physical Therapy Evaluation  Patient Details  Name: [name] MRN: [id number] Date of Birth: 23-Oct-1970 Referring Provider:  Marchia Bond, MD  Encounter Date: 05/25/2014      PT End of Session - 05/25/14 0809    Visit Number 1   Number of Visits 16   Date for PT Re-Evaluation 07/06/14   PT Start Time 0710   PT Stop Time 7253   PT Time Calculation (min) 47 min   Activity Tolerance Patient tolerated treatment well      Past Medical History  Diagnosis Date  . Upper GI bleed   . Ventral hernia   . Hiatal hernia   . History of blood clots 2012    in abdomen  . Dizziness     occasionally  . Joint pain     knees  . GERD (gastroesophageal reflux disease)     takes Protonix bid  . Gastric ulcer     history of  . Constipation     related to pain meds  . Nocturia     depends on amount of fluid he drinks  . History of blood transfusion 2012  . Anxiety     takes Xanax daily prn  . External hemorrhoid, bleeding   . Diabetes mellitus without complication   . Primary localized osteoarthritis of left hip 04/24/2014    Past Surgical History  Procedure Laterality Date  . Abdominal surgery  10/2010    d/t bleeding ulcers; "oversewing of gastric ulcer"  . Esophagogastroduodenoscopy    . Hernia repair  09/04/11    incisional   . Incisional hernia repair  09/04/2011    Procedure: HERNIA REPAIR INCISIONAL;  Surgeon: Madilyn Hook, DO;  Location: [facility];  Service: General;  Laterality: N/A;  debridment calcified mass  . Total hip arthroplasty Left 04/24/2014    Procedure: LEFT TOTAL HIP ARTHROPLASTY;  Surgeon: Marchia Bond, MD;  Location: [facility];  Service: Orthopedics;  Laterality: Left;    There were no vitals filed for this visit.  Visit Diagnosis:  Status post left hip replacement - Plan: PT plan of care cert/re-cert  Muscle  weakness of lower extremity - Plan: PT plan of care cert/re-cert  Joint stiffness - Plan: PT plan of care cert/re-cert  Difficulty in walking - Plan: PT plan of care cert/re-cert      Subjective Assessment - 05/25/14 0713    Subjective Patient presents with [facility] s/p left THR on 04/24/14 for arthritis (genetically predisposed).  Onset 2 years ago.  No right LE pain.  Posterior approach.  3 day hospitalization.  Discharged home and had HHPT.  Did stairs, in/out of bed and exercises 2x/week.  Completed last week.  Still with difficulty bending over, sleeping.  Still on precautions for hip.     Limitations Standing;Walking   How long can you sit comfortably? 20 min   How long can you stand comfortably? 45 min   How long can you walk comfortably? 45 min   Diagnostic tests x-ray pre op   Patient Stated Goals have no pain; learn more how to not hurt it and let it heal   Currently in Pain? Yes   Pain Score 5    Pain Location Hip   Pain Orientation Left   Pain Type Surgical pain   Pain Onset More than a month ago   Pain Frequency Constant  Aggravating Factors  prolonged position especially sitting   Pain Relieving Factors moving around, stretching            [facility] PT Assessment - 05/25/14 0714    Assessment   Medical Diagnosis left THR   Onset Date 04/24/14   Next MD Visit 06/06/14   Prior Therapy HHPT   Precautions   Precautions Posterior Hip   Restrictions   Weight Bearing Restrictions No   Other Position/Activity Restrictions WBAT   Balance Screen   Has the patient fallen in the past 6 months Yes   How many times? 1  tripped going up and down stairs   Has the patient had a decrease in activity level because of a fear of falling?  No   Is the patient reluctant to leave their home because of a fear of falling?  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   Leisure softball, golf, watch nieces and nephews play   Observation/Other Assessments   Focus on Therapeutic Outcomes (FOTO)  59%   Posture/Postural Control   Posture Comments --  Exterior incison appears well healed no redness   ROM / Strength   AROM / PROM / Strength AROM;Strength   AROM   AROM Assessment Site Hip;Knee   Right/Left Knee Left;Right   Right Knee Extension 0   Right Knee Flexion 137   Left Knee Extension 0   Left Knee Flexion 131   Strength   Strength Assessment Site Hip;Knee;Ankle   Right/Left Hip Right;Left   Right Hip Flexion 5/5   Right Hip Extension 5/5   Right Hip ABduction 5/5   Left Hip Flexion 3/5   Left Hip Extension 3/5   Left Hip ABduction 3/5   Right/Left Knee Right;Left   Right Knee Flexion 5/5   Right Knee Extension 5/5   Left Knee Flexion 4-/5   Left Knee Extension 4-/5   Right/Left Ankle Left;Right   Right Ankle Dorsiflexion 5/5   Left Ankle Dorsiflexion 5/5   Ambulation/Gait   Ambulation/Gait Yes   Ambulation Distance (Feet) --  TUG 19.2 sec with SPC   Assistive device Straight cane   Gait velocity 45m walk 12.5 sec                   [facility] Adult PT Treatment/Exercise - 05/25/14 0714    Knee/Hip Exercises: Aerobic   Stationary Bike Nu-Step LEs only, leaning back 10 min L4                PT Education - 05/25/14 0808    Education provided Yes   Education Details sidelying hip abduction with 2 pillows between knees and sit to stand no hands;   Person(s) Educated Patient   Methods Explanation;Demonstration;Handout   Comprehension Verbalized understanding;Returned demonstration          PT Short Term Goals -  05/25/14 0821    PT SHORT TERM GOAL #1   Title Patient will have left knee flexion to 135 degrees needed for greater  mobility in/out of the car.   Time 4   Period Weeks   Status New   PT SHORT TERM GOAL #2   Title Patient will be able to walk 1/4 mile with cane needed for community ambulation   Time 4   Period Weeks   Status New   PT SHORT TERM GOAL #3   Title Gait speed with or without cane in 10 m walk at least 1.80m/sec.   Time 4   Period Weeks   Status New           PT Long Term Goals - 05/25/14 3825    PT LONG TERM GOAL #1   Title Patient will be independent in progressive HEP needed for further ROM and strengthening of left LE   Time 8   Period Weeks   Status New   PT LONG TERM GOAL #2   Title Left hip abduction strength improved to 3+/5 to 4-/5 needed for standing and walking longer periods of time for return to work   Time 8   Period Weeks   Status New   PT LONG TERM GOAL #3   Title Patient will have quad strength 4+/5 needed for ascending /descending steps reciprocally.   Time 8   Period Weeks   Status New   PT LONG TERM GOAL #4   Title FOTO functional outcome score improved from 59% to 41% indicating improved function with less pain.    Time 8   Period Weeks   Status New   PT LONG TERM GOAL #5   Title Timed up and Go test improved to 13 sec indicating improved gait speed and functional strength with sit to stand.     Time 8   Period Weeks   Status New               Plan - 05/25/14 0539    Clinical Impression Statement The patient is a pleasant 44 year old male who had left hip degenerative arthritis for about 2 years.  He underwent left posterior THR 04/24/14.  After a normal inpatient stay, he was discharged home.  He had HHPT with focus on stair training, basic exercises and general mobility.  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  He is limited in gait endurance and has not attempted walking 1/4 mile.  He is able to walk inside his home without a cane but uses it for out of the house ambulation.  Gait speed .7m/sec. His bedroom is upstairs and he uses a step to method rather than reciprocal.  Left knee flexion AROM limited slightly 131 degrees.  Left hip flexion to 90 degrees as limited by hip precautions.  Left hip abduction 15 degrees.  Strength quads and HS 4-5.  Left hip gluteals, hip flexors 3/5.    Pt will benefit from skilled therapeutic intervention in order to improve on the following deficits Abnormal gait;Pain;Decreased range of motion;Decreased strength   Rehab Potential Good   Clinical Impairments Affecting Rehab Potential posterior hip precautions   PT Frequency 2x / week   PT Duration 8 weeks   PT Treatment/Interventions ADLs/Self Care Home Management;Cryotherapy;Electrical Stimulation;Moist Heat;Gait training;Stair training;Functional mobility training;Therapeutic exercise;Patient/family education   PT Next Visit Plan Continue Nu-Step LEs only secondary to hip precautions; review HEP including hip abduction sidelying with pillows b/w knees and sit to stand no hands;  add leg press; treadmill         Problem List Patient Active Problem List   Diagnosis Date Noted  . Primary localized osteoarthritis of left hip 04/24/2014  . Dizziness 09/20/2013  . Otitis externa of left ear 09/20/2013  . Elevated hemoglobin A1c 11/17/2012  . Erectile dysfunction 08/08/2012  . Ventral hernia 05/26/2011  . H/O: upper GI bleed 05/26/2011  . Anxiety 05/26/2011  . DJD (degenerative joint disease) 10/14/2010  . Gastric ulcer 09/29/2010    Alvera Singh 05/25/2014, 8:29 AM  [facility] [address] [place], [place], [address] Phone: [phone]   Fax:  [phone]  Ruben Im, PT 05/25/2014 8:30 AM Phone: [phone] Fax: [phone]

## 2014-05-29 ENCOUNTER — Encounter: Payer: 59 | Admitting: Physical Therapy

## 2014-05-30 ENCOUNTER — Ambulatory Visit (INDEPENDENT_AMBULATORY_CARE_PROVIDER_SITE_OTHER): Payer: 59 | Admitting: Family Medicine

## 2014-05-30 ENCOUNTER — Encounter: Payer: Self-pay | Admitting: Family Medicine

## 2014-05-30 VITALS — BP 114/72 | HR 79 | Temp 97.7°F | Wt 204.5 lb

## 2014-05-30 DIAGNOSIS — F419 Anxiety disorder, unspecified: Secondary | ICD-10-CM

## 2014-05-30 DIAGNOSIS — M150 Primary generalized (osteo)arthritis: Secondary | ICD-10-CM | POA: Diagnosis not present

## 2014-05-30 DIAGNOSIS — G4700 Insomnia, unspecified: Secondary | ICD-10-CM | POA: Diagnosis not present

## 2014-05-30 DIAGNOSIS — R0683 Snoring: Secondary | ICD-10-CM

## 2014-05-30 DIAGNOSIS — M159 Polyosteoarthritis, unspecified: Secondary | ICD-10-CM

## 2014-05-30 DIAGNOSIS — R7309 Other abnormal glucose: Secondary | ICD-10-CM | POA: Diagnosis not present

## 2014-05-30 LAB — HEMOGLOBIN A1C: Hgb A1c MFr Bld: 5.4 % (ref 4.6–6.5)

## 2014-05-30 MED ORDER — ZOLPIDEM TARTRATE ER 12.5 MG PO TBCR: 12.5000 mg | EXTENDED_RELEASE_TABLET | Freq: Every evening | ORAL | Status: DC | PRN

## 2014-05-30 NOTE — Assessment & Plan Note (Signed)
S/p left THR. Follows up with Dr. Mardelle Matte next week. Continue PT.

## 2014-05-30 NOTE — Progress Notes (Signed)
Pre visit review using our clinic review tool, if applicable. No additional management support is needed unless otherwise documented below in the visit note. 

## 2014-05-30 NOTE — Assessment & Plan Note (Signed)
Deteriorated now with snoring and ?apneic episodes. Increase ambien to 12.5 mg nightly as needed. Refer to pulm for sleep study to evaluate sleep architecture and rule out OSA. The patient indicates understanding of these issues and agrees with the plan.

## 2014-05-30 NOTE — Progress Notes (Signed)
Subjective:   Patient ID: [name], male    DOB: 1970/11/11, 44 y.o.   MRN: [id number]  [name] is a pleasant 44 y.o. year old male who presents to clinic today with Follow-up and Insomnia  on 05/30/2014  HPI:  S/p left posterior THR on 04/24/14.  Completed HHPT and is now working with PT to further ROM. Still having to take narcotic for severe pain but mostly pain is improving- usually around 3 or 4 at its worst.  Anxiety with insomnia- chronic issue.  Taking buspar 30 mg twice daily with prn Klonopin and ambien.  Insomnia- was well controlled on ambien 10 mg nightly but once his hip pain started worsening, his sleep worsened as well.  Difficulty falling and staying asleep. His girlfriend has said that he snores and upon questioning "maybe has stopped breathing."  We have been also monitoring his a1c as it was previously elevated.  Was on Metformin but this was d/c'd when he was in the hospital.  Lab Results  Component Value Date   HGBA1C 6.0 07/12/2013   Wt Readings from Last 3 Encounters:  05/30/14 204 lb 8 oz (92.761 kg)  04/24/14 204 lb (92.534 kg)  03/26/14 205 lb 4 oz (93.101 kg)   Current Outpatient Prescriptions on File Prior to Visit  Medication Sig Dispense Refill  . baclofen (LIORESAL) 10 MG tablet Take 1 tablet (10 mg total) by mouth 3 (three) times daily. As needed for muscle spasm 50 tablet 0  . busPIRone (BUSPAR) 30 MG tablet Take 1 tablet (30 mg total) by mouth 2 (two) times daily. 180 tablet 1  . clonazePAM (KLONOPIN) 0.25 MG disintegrating tablet Take 1 tablet (0.25 mg total) by mouth at bedtime as needed (insomnia, panic attack). 30 tablet 0  . ondansetron (ZOFRAN) 4 MG tablet Take 1 tablet (4 mg total) by mouth every 8 (eight) hours as needed for nausea or vomiting. 30 tablet 0  . oxyCODONE-acetaminophen (PERCOCET) 10-325 MG per tablet Take 1-2 tablets by mouth every 6 (six) hours as needed for pain. MAXIMUM TOTAL ACETAMINOPHEN DOSE IS 4000 MG PER DAY  75 tablet 0  . pantoprazole (PROTONIX) 40 MG tablet Take 1 tablet (40 mg total) by mouth 2 (two) times daily. 180 tablet 1  . rivaroxaban (XARELTO) 10 MG TABS tablet Take 1 tablet (10 mg total) by mouth daily. 21 tablet 0  . sennosides-docusate sodium (SENOKOT-S) 8.6-50 MG tablet Take 2 tablets by mouth daily. 30 tablet 1  . zolpidem (AMBIEN) 10 MG tablet TAKE 1 TABLET BY MOUTH AT BEDTIME  PATIENT NEEDS OFFICE VISIT 30 tablet 0   No current facility-administered medications on file prior to visit.    Allergies  Allergen Reactions  . Dilaudid [Hydromorphone Hcl] Rash and Other (See Comments)    Shakey  . Metoclopramide Hcl Hives, Swelling and Rash    Swelling in lips    Past Medical History  Diagnosis Date  . Upper GI bleed   . Ventral hernia   . Hiatal hernia   . History of blood clots 2012    in abdomen  . Dizziness     occasionally  . Joint pain     knees  . GERD (gastroesophageal reflux disease)     takes Protonix bid  . Gastric ulcer     history of  . Constipation     related to pain meds  . Nocturia     depends on amount of fluid he drinks  . History  of blood transfusion 2012  . Anxiety     takes Xanax daily prn  . External hemorrhoid, bleeding   . Diabetes mellitus without complication   . Primary localized osteoarthritis of left hip 04/24/2014    Past Surgical History  Procedure Laterality Date  . Abdominal surgery  10/2010    d/t bleeding ulcers; "oversewing of gastric ulcer"  . Esophagogastroduodenoscopy    . Hernia repair  09/04/11    incisional   . Incisional hernia repair  09/04/2011    Procedure: HERNIA REPAIR INCISIONAL;  Surgeon: Madilyn Hook, DO;  Location: [place];  Service: General;  Laterality: N/A;  debridment calcified mass  . Total hip arthroplasty Left 04/24/2014    Procedure: LEFT TOTAL HIP ARTHROPLASTY;  Surgeon: Marchia Bond, MD;  Location: [place] [facility];  Service: Orthopedics;  Laterality: Left;    Family History  Problem Relation Age of Onset  .  Diabetes Mother   . Diabetes Father   . Heart disease Father     History   Social History  . Marital Status: Single    Spouse Name: N/A  . Number of Children: N/A  . Years of Education: N/A   Occupational History  . Not on file.   Social History Main Topics  . Smoking status: Never Smoker   . Smokeless tobacco: Current User    Types: Chew     Comment: occ-Chew  . Alcohol Use: 0.0 oz/week    0 Standard drinks or equivalent per week     Comment: 09/04/11 "very seldom"  . Drug Use: Yes  . Sexual Activity: Yes   Other Topics Concern  . Not on file   Social History Narrative   The PMH, PSH, Social History, Family History, Medications, and allergies have been reviewed in [facility], and have been updated if relevant.   Review of Systems  Constitutional: Negative.   Respiratory: Negative.   Cardiovascular: Negative.   Endocrine: Negative.   Musculoskeletal: Positive for arthralgias and gait problem.  Psychiatric/Behavioral: Positive for sleep disturbance. Negative for dysphoric mood. The patient is not nervous/anxious.   All other systems reviewed and are negative.      Objective:    BP 114/72 mmHg  Pulse 79  Temp(Src) 97.7 F (36.5 C) (Oral)  Wt 204 lb 8 oz (92.761 kg)  SpO2 98%   Physical Exam  Constitutional: He is oriented to person, place, and time. He appears well-developed and well-nourished. No distress.  HENT:  Head: Normocephalic.  Eyes: Conjunctivae are normal.  Neck: Normal range of motion.  Cardiovascular: Normal rate.   Pulmonary/Chest: Effort normal.  Musculoskeletal:  Walking with cane  Neurological: He is alert and oriented to person, place, and time. No cranial nerve deficit.  Skin: Skin is warm and dry.  Psychiatric: He has a normal mood and affect. His behavior is normal. Judgment and thought content normal.  Nursing note and vitals reviewed.         Assessment & Plan:   Primary osteoarthritis involving multiple  joints  Anxiety  Elevated hemoglobin A1c No Follow-up on file.

## 2014-05-30 NOTE — Patient Instructions (Signed)
We are increasing your dose of ambien and we are referring you for a sleep study.

## 2014-05-30 NOTE — Assessment & Plan Note (Signed)
Well controlled on current rx. No changes made. 

## 2014-05-30 NOTE — Assessment & Plan Note (Signed)
Off metformin- ?hypoglycemia in hospital. Check a1c today.  Continue to hold Metformin.

## 2014-05-31 ENCOUNTER — Ambulatory Visit: Payer: 59 | Admitting: Physical Therapy

## 2014-05-31 DIAGNOSIS — Z96642 Presence of left artificial hip joint: Secondary | ICD-10-CM

## 2014-05-31 DIAGNOSIS — M6281 Muscle weakness (generalized): Secondary | ICD-10-CM

## 2014-05-31 DIAGNOSIS — R262 Difficulty in walking, not elsewhere classified: Secondary | ICD-10-CM

## 2014-05-31 DIAGNOSIS — M2560 Stiffness of unspecified joint, not elsewhere classified: Secondary | ICD-10-CM

## 2014-05-31 NOTE — Therapy (Signed)
[place], [place], [address] Phone: [phone]   Fax:  [phone]  Physical Therapy Treatment  Patient Details  Name: [name] MRN: [id number] Date of Birth: 1970/03/20 Referring Provider:  [name], MD  Encounter Date: 05/31/2014      PT End of Session - 05/31/14 1015    Visit Number 2   Number of Visits 16   Date for PT Re-Evaluation 07/06/14   PT Start Time 1012   PT Stop Time 1115   PT Time Calculation (min) 63 min      Past Medical History  Diagnosis Date  . Upper GI bleed   . Ventral hernia   . Hiatal hernia   . History of blood clots 2012    in abdomen  . Dizziness     occasionally  . Joint pain     knees  . GERD (gastroesophageal reflux disease)     takes Protonix bid  . Gastric ulcer     history of  . Constipation     related to pain meds  . Nocturia     depends on amount of fluid he drinks  . History of blood transfusion 2012  . Anxiety     takes Xanax daily prn  . External hemorrhoid, bleeding   . Diabetes mellitus without complication   . Primary localized osteoarthritis of left hip 04/24/2014    Past Surgical History  Procedure Laterality Date  . Abdominal surgery  10/2010    d/t bleeding ulcers; "oversewing of gastric ulcer"  . Esophagogastroduodenoscopy    . Hernia repair  09/04/11    incisional   . Incisional hernia repair  09/04/2011    Procedure: HERNIA REPAIR INCISIONAL;  Surgeon: Madilyn Hook, DO;  Location: [facility];  Service: General;  Laterality: N/A;  debridment calcified mass  . Total hip arthroplasty Left 04/24/2014    Procedure: LEFT TOTAL HIP ARTHROPLASTY;  Surgeon: Marchia Bond, MD;  Location: [facility];  Service: Orthopedics;  Laterality: Left;    There were no vitals filed for this visit.  Visit Diagnosis:  Status post left hip replacement  Muscle weakness of lower extremity  Joint stiffness  Difficulty in walking      Subjective  Assessment - 05/31/14 1016    Subjective The pt. reports feeling a little sore after last treatment, but was ok other than that.  He reports that his right leg is sore because it is having to help out the left leg.  4/10 pain after treatment, before ice.    Currently in Pain? Yes   Pain Score 4    Pain Location Hip   Pain Orientation Left                         [facility] Adult PT Treatment/Exercise - 05/31/14 1017    Knee/Hip Exercises: Aerobic   Stationary Bike Nu-Step LEs only, leaning back 6 min L3   Knee/Hip Exercises: Machines for Strengthening   Cybex Leg Press 1 plate x 15; 2 plates x 20   Knee/Hip Exercises: Standing   Lateral Step Up 20 reps;Hand Hold: 1;Step Height: 6";Left  cues to bear weight on left   Forward Step Up 20 reps;Hand Hold: 1;Step Height: 4";Step Height: 6";Left   SLS multiple trials 22 second best; 14 second best on foam pad with 1-2 finger hold   Other Standing Knee Exercises mini squats in parallel bars x 15  cues  to keep back straight and not go to 90 degrees   Knee/Hip Exercises: Seated   Long Arc Quad Strengthening;15 reps   Long Arc Quad Weight 5 lbs.   Long [employer] Limitations 5 second hold   Knee/Hip Exercises: Supine   Short Arc [employer] Strengthening;15 reps   Short Arc Quad Sets Limitations 3 sec hold; 3#   Straight Leg Raises 15 reps;Left  3 sec hold   Knee/Hip Exercises: Sidelying   Hip ABduction 20 reps;10 reps  10 x 3 sec hold   Knee/Hip Exercises: Prone   Hip Extension Left;15 reps   Modalities   Modalities Cryotherapy   Cryotherapy   Number Minutes Cryotherapy 15 Minutes   Cryotherapy Location Hip;Other (comment)  sitting per pt. preference   Type of Cryotherapy Ice pack                  PT Short Term Goals - 05/25/14 3875    PT SHORT TERM GOAL #1   Title Patient will have left knee flexion to 135 degrees needed for greater mobility in/out of the car.   Time 4   Period Weeks   Status New   PT SHORT  TERM GOAL #2   Title Patient will be able to walk 1/4 mile with cane needed for community ambulation   Time 4   Period Weeks   Status New   PT SHORT TERM GOAL #3   Title Gait speed with or without cane in 10 m walk at least 1.3m/sec.   Time 4   Period Weeks   Status New           PT Long Term Goals - 05/25/14 6433    PT LONG TERM GOAL #1   Title Patient will be independent in progressive HEP needed for further ROM and strengthening of left LE   Time 8   Period Weeks   Status New   PT LONG TERM GOAL #2   Title Left hip abduction strength improved to 3+/5 to 4-/5 needed for standing and walking longer periods of time for return to work   Time 8   Period Weeks   Status New   PT LONG TERM GOAL #3   Title Patient will have quad strength 4+/5 needed for ascending /descending steps reciprocally.   Time 8   Period Weeks   Status New   PT LONG TERM GOAL #4   Title FOTO functional outcome score improved from 59% to 41% indicating improved function with less pain.    Time 8   Period Weeks   Status New   PT LONG TERM GOAL #5   Title Timed up and Go test improved to 13 sec indicating improved gait speed and functional strength with sit to stand.     Time 8   Period Weeks   Status New               Plan - 05/31/14 1102    Clinical Impression Statement The pt. was able to progress to the leg press, SAQ, LAQ, and step ups today with no major increase in pain.  He was reminded to be aware of his hip precautions.  The pt. was able to maintain a 22 second SLS on level ground and a 14 second SLS on foam pad in parallel bars with minimal sway.  He is experiencing pain in his right leg due to that side having to compensate for the left sided weakness but was informed that  it was ok.     PT Next Visit Plan Continue Nu-Step LEs only secondary to hip precautions;  leg press; treadmill, gait training        Problem List Patient Active Problem List   Diagnosis Date Noted  .  Insomnia 05/30/2014  . Snores 05/30/2014  . Primary localized osteoarthritis of left hip 04/24/2014  . Dizziness 09/20/2013  . Otitis externa of left ear 09/20/2013  . Elevated hemoglobin A1c 11/17/2012  . Erectile dysfunction 08/08/2012  . Ventral hernia 05/26/2011  . H/O: upper GI bleed 05/26/2011  . Anxiety 05/26/2011  . DJD (degenerative joint disease) 10/14/2010  . Gastric ulcer 09/29/2010    Dmarion Perfect,[place], SPTA 05/31/2014, 12:03 PM  [facility] [address] [place], [place], [address] Phone: [phone]   Fax:  [phone]

## 2014-06-05 ENCOUNTER — Ambulatory Visit: Payer: 59 | Attending: Orthopedic Surgery | Admitting: Physical Therapy

## 2014-06-05 DIAGNOSIS — M6281 Muscle weakness (generalized): Secondary | ICD-10-CM | POA: Insufficient documentation

## 2014-06-05 DIAGNOSIS — Z9660 Presence of unspecified orthopedic joint implant: Secondary | ICD-10-CM | POA: Insufficient documentation

## 2014-06-05 DIAGNOSIS — M2560 Stiffness of unspecified joint, not elsewhere classified: Secondary | ICD-10-CM | POA: Diagnosis not present

## 2014-06-05 DIAGNOSIS — R262 Difficulty in walking, not elsewhere classified: Secondary | ICD-10-CM | POA: Diagnosis not present

## 2014-06-05 DIAGNOSIS — Z96642 Presence of left artificial hip joint: Secondary | ICD-10-CM

## 2014-06-05 NOTE — Therapy (Signed)
[place], [place], [address] Phone: [phone]   Fax:  [phone]  Physical Therapy Treatment  Patient Details  Name: [name] MRN: [id number] Date of Birth: 04-07-1970 Referring Provider:  [name], MD  Encounter Date: 06/05/2014      PT End of Session - 06/05/14 0836    Visit Number 3   Number of Visits 16   Date for PT Re-Evaluation 07/06/14   PT Start Time 0800   PT Stop Time 3532   PT Time Calculation (min) 55 min   Activity Tolerance Patient tolerated treatment well      Past Medical History  Diagnosis Date  . Upper GI bleed   . Ventral hernia   . Hiatal hernia   . History of blood clots 2012    in abdomen  . Dizziness     occasionally  . Joint pain     knees  . GERD (gastroesophageal reflux disease)     takes Protonix bid  . Gastric ulcer     history of  . Constipation     related to pain meds  . Nocturia     depends on amount of fluid he drinks  . History of blood transfusion 2012  . Anxiety     takes Xanax daily prn  . External hemorrhoid, bleeding   . Diabetes mellitus without complication   . Primary localized osteoarthritis of left hip 04/24/2014    Past Surgical History  Procedure Laterality Date  . Abdominal surgery  10/2010    d/t bleeding ulcers; "oversewing of gastric ulcer"  . Esophagogastroduodenoscopy    . Hernia repair  09/04/11    incisional   . Incisional hernia repair  09/04/2011    Procedure: HERNIA REPAIR INCISIONAL;  Surgeon: Madilyn Hook, DO;  Location: [facility];  Service: General;  Laterality: N/A;  debridment calcified mass  . Total hip arthroplasty Left 04/24/2014    Procedure: LEFT TOTAL HIP ARTHROPLASTY;  Surgeon: Marchia Bond, MD;  Location: [facility];  Service: Orthopedics;  Laterality: Left;    There were no vitals filed for this visit.  Visit Diagnosis:  Status post left hip replacement  Muscle weakness of lower extremity  Joint  stiffness  Difficulty in walking      Subjective Assessment - 06/05/14 0800    Subjective States he was startled yesterday and jerked his hip, had increase in soreness as a result in left hip.  Reports less soreness in right leg this week.  I walk a lot.  Uses cane sometimes.  Not using today but states it is in the truck.     Currently in Pain? Yes   Pain Score 4    Pain Location Hip   Pain Orientation Right   Pain Type Surgical pain   Aggravating Factors  prolonged sitting   Pain Relieving Factors moving around                         [facility] Adult PT Treatment/Exercise - 06/05/14 0802    Knee/Hip Exercises: Aerobic   Stationary Bike Nu-Step LEs only L2 7 min   Knee/Hip Exercises: Machines for Strengthening   Cybex Leg Press 40# 2 legs 15x;20# left > right 15x   Knee/Hip Exercises: Standing   Lateral Step Up Left;1 set;20 reps;Hand Hold: 0  with opposite right hip abd   Forward Step Up Left;1 set;20 reps;Hand Hold: 0  with right hip to  90 degrees   Step Down Left;10 reps;Step Height: 4";Hand Hold: 1   Other Standing Knee Exercises hip extension green band 15x right and left   Knee/Hip Exercises: Seated   Long Arc Quad Strengthening;15 reps   Long Arc Quad Weight 5 lbs.   Other Seated Knee Exercises sit to stand high table 15x   Knee/Hip Exercises: Supine   Bridges Both;1 set;Strengthening;10 reps  with ball   Other Supine Knee Exercises HS sets on ball 10x   Knee/Hip Exercises: Sidelying   Hip ADduction Strengthening;Left;1 set;15 reps  2 pillows between legs   Clams 15   Knee/Hip Exercises: Prone   Hamstring Curl 1 set;10 reps  right and left; with multifidi contract   Other Prone Exercises hip extension   with multifidi press right and left 15x   Cryotherapy   Number Minutes Cryotherapy 10 Minutes   Cryotherapy Location Hip  left supine   Type of Cryotherapy Ice pack                  PT Short Term Goals - 06/05/14 0844    PT SHORT  TERM GOAL #1   Title Patient will have left knee flexion to 135 degrees needed for greater mobility in/out of the car.   Time 4   Period Weeks   Status On-going   PT SHORT TERM GOAL #2   Title Patient will be able to walk 1/4 mile with cane needed for community ambulation   Time 4   Period Weeks   Status Achieved   PT SHORT TERM GOAL #3   Title Gait speed with or without cane in 10 m walk at least 1.26m/sec.   Time 4   Period Weeks   Status On-going           PT Long Term Goals - 06/05/14 0846    PT LONG TERM GOAL #1   Title Patient will be independent in progressive HEP needed for further ROM and strengthening of left LE   Time 8   Period Weeks   Status On-going   PT LONG TERM GOAL #2   Title Left hip abduction strength improved to 3+/5 to 4-/5 needed for standing and walking longer periods of time for return to work   Time 8   Period Weeks   Status On-going   PT LONG TERM GOAL #3   Title Patient will have quad strength 4+/5 needed for ascending /descending steps reciprocally.   Time 8   Period Weeks   PT LONG TERM GOAL #4   Title FOTO functional outcome score improved from 59% to 41% indicating improved function with less pain.    Time 8   Period Weeks   Status On-going   PT LONG TERM GOAL #5   Title Timed up and Go test improved to 13 sec indicating improved gait speed and functional strength with sit to stand.     Time 8   Period Weeks   Status On-going               Plan - 06/05/14 8182    Clinical Impression Statement Patient progressing well with mobility and strength as expected for this length of time from surgery.  Still with gluteal weakness causing limp with ambulation.  Continue with plan of care.   PT Next Visit Plan Continue Nu-Step LEs only secondary to hip precautions;  leg press; treadmill; recheck knee flexion AROM; 10 m walk;  gluteal strengthening; see how MD appt went  5/3        Problem List Patient Active Problem List   Diagnosis  Date Noted  . Insomnia 05/30/2014  . Snores 05/30/2014  . Primary localized osteoarthritis of left hip 04/24/2014  . Dizziness 09/20/2013  . Otitis externa of left ear 09/20/2013  . Elevated hemoglobin A1c 11/17/2012  . Erectile dysfunction 08/08/2012  . Ventral hernia 05/26/2011  . H/O: upper GI bleed 05/26/2011  . Anxiety 05/26/2011  . DJD (degenerative joint disease) 10/14/2010  . Gastric ulcer 09/29/2010    Alvera Singh 06/05/2014, 8:48 AM  [facility] [address] [place], [place], [address] Phone: [phone]   Fax:  [phone]   Ruben Im, PT 06/05/2014 8:49 AM Phone: [phone] Fax: [phone]

## 2014-06-07 ENCOUNTER — Ambulatory Visit: Payer: 59 | Admitting: Physical Therapy

## 2014-06-07 ENCOUNTER — Encounter: Payer: Self-pay | Admitting: Family Medicine

## 2014-06-07 DIAGNOSIS — Z96642 Presence of left artificial hip joint: Secondary | ICD-10-CM

## 2014-06-07 DIAGNOSIS — M6281 Muscle weakness (generalized): Secondary | ICD-10-CM

## 2014-06-07 DIAGNOSIS — R262 Difficulty in walking, not elsewhere classified: Secondary | ICD-10-CM

## 2014-06-07 DIAGNOSIS — M2560 Stiffness of unspecified joint, not elsewhere classified: Secondary | ICD-10-CM

## 2014-06-07 MED ORDER — CLONAZEPAM 0.25 MG PO TBDP: 0.2500 mg | ORAL_TABLET | Freq: Every evening | ORAL | Status: DC | PRN

## 2014-06-07 NOTE — Therapy (Signed)
[place], [place], [address] Phone: [phone]   Fax:  [phone]  Physical Therapy Treatment  Patient Details  Name: [name] MRN: [id number] Date of Birth: 1970-05-17 Referring Provider:  [name], MD  Encounter Date: 06/07/2014      PT End of Session - 06/07/14 0829    Visit Number 4   Number of Visits 16   Date for PT Re-Evaluation 07/06/14   PT Start Time 0800   PT Stop Time 0848   PT Time Calculation (min) 48 min   Activity Tolerance Patient tolerated treatment well      Past Medical History  Diagnosis Date  . Upper GI bleed   . Ventral hernia   . Hiatal hernia   . History of blood clots 2012    in abdomen  . Dizziness     occasionally  . Joint pain     knees  . GERD (gastroesophageal reflux disease)     takes Protonix bid  . Gastric ulcer     history of  . Constipation     related to pain meds  . Nocturia     depends on amount of fluid he drinks  . History of blood transfusion 2012  . Anxiety     takes Xanax daily prn  . External hemorrhoid, bleeding   . Diabetes mellitus without complication   . Primary localized osteoarthritis of left hip 04/24/2014    Past Surgical History  Procedure Laterality Date  . Abdominal surgery  10/2010    d/t bleeding ulcers; "oversewing of gastric ulcer"  . Esophagogastroduodenoscopy    . Hernia repair  09/04/11    incisional   . Incisional hernia repair  09/04/2011    Procedure: HERNIA REPAIR INCISIONAL;  Surgeon: Madilyn Hook, DO;  Location: [facility];  Service: General;  Laterality: N/A;  debridment calcified mass  . Total hip arthroplasty Left 04/24/2014    Procedure: LEFT TOTAL HIP ARTHROPLASTY;  Surgeon: Marchia Bond, MD;  Location: [facility];  Service: Orthopedics;  Laterality: Left;    There were no vitals filed for this visit.  Visit Diagnosis:  Status post left hip replacement  Muscle weakness of lower extremity  Joint  stiffness  Difficulty in walking      Subjective Assessment - 06/07/14 0803    Subjective States he is a little more sore today.  Saw the doctor and he felt that things were on track.  Plans on return to work in 4 weeks.     Currently in Pain? Yes   Pain Score 3    Pain Location Hip   Pain Orientation Left   Pain Type Surgical pain   Pain Frequency Constant   Aggravating Factors  prolonged sitting   Pain Relieving Factors ice            [facility] PT Assessment - 06/07/14 0808    Ambulation/Gait   Ambulation/Gait Yes   Gait velocity 10 m walk 10 sec   Gait Comments TUG 12.4 sec no device                     [facility] Adult PT Treatment/Exercise - 06/07/14 0808    Knee/Hip Exercises: Aerobic   Stationary Bike Nu-Step LEs only L4  10 min   Knee/Hip Exercises: Machines for Strengthening   Cybex Leg Press 40# 2 legs 15x;20# left only 15x   Knee/Hip Exercises: Standing   Forward Step Up Left;1 set;15  reps;Step Height: 6";Hand Hold: 1   Walking with Sports Cord pulley BW and FW plate C 8 x each   Gait Training green band side step and cowboy walks 4 laps each   Other Standing Knee Exercises SLS on left, right on BOSU with green band UE diagonals 25x   Other Standing Knee Exercises hip isometrics extension and abd 5x 5 sec hold right/left   Cryotherapy   Number Minutes Cryotherapy 10 Minutes   Cryotherapy Location Hip   Type of Cryotherapy Ice pack  supine                  PT Short Term Goals - 06/07/14 8264    PT SHORT TERM GOAL #1   Title Patient will have left knee flexion to 135 degrees needed for greater mobility in/out of the car.   Time 4   Period Weeks   Status Achieved   PT SHORT TERM GOAL #2   Title Patient will be able to walk 1/4 mile with cane needed for community ambulation   Status Achieved   PT SHORT TERM GOAL #3   Title Gait speed with or without cane in 10 m walk at least 1.5m/sec.   Status Achieved           PT Long Term  Goals - 06/07/14 1583    PT LONG TERM GOAL #1   Title Patient will be independent in progressive HEP needed for further ROM and strengthening of left LE   Time 8   Period Weeks   Status On-going   PT LONG TERM GOAL #2   Title Left hip abduction strength improved to 3+/5 to 4-/5 needed for standing and walking longer periods of time for return to work   Time 8   Period Weeks   Status On-going   PT LONG TERM GOAL #3   Title Patient will have quad strength 4+/5 needed for ascending /descending steps reciprocally.   Period Weeks   Status On-going   PT LONG TERM GOAL #4   Title FOTO functional outcome score improved from 59% to 41% indicating improved function with less pain.    Time 8   Period Weeks   Status On-going   PT LONG TERM GOAL #5   Title Timed up and Go test improved to 13 sec indicating improved gait speed and functional strength with sit to stand.     Status Achieved               Plan - 06/07/14 0830    Clinical Impression Statement Patient is progressing well hip strengthening but with continued gluteal (medius) weakness contributing to a limp.  Improved gait speed with 10 m walk.  Therapist closely monitoring pain and adherence to posterior hip precautions.   PT Next Visit Plan Continue left LE strengthening especially gluteals;  continue to follow hip precautions        Problem List Patient Active Problem List   Diagnosis Date Noted  . Insomnia 05/30/2014  . Snores 05/30/2014  . Primary localized osteoarthritis of left hip 04/24/2014  . Dizziness 09/20/2013  . Otitis externa of left ear 09/20/2013  . Elevated hemoglobin A1c 11/17/2012  . Erectile dysfunction 08/08/2012  . Ventral hernia 05/26/2011  . H/O: upper GI bleed 05/26/2011  . Anxiety 05/26/2011  . DJD (degenerative joint disease) 10/14/2010  . Gastric ulcer 09/29/2010    Alvera Singh 06/07/2014, 10:00 AM  [place], [place], [address] Phone: [phone]   Fax:  [place], PT 06/07/2014 10:00 AM Phone: [phone] Fax: [phone]

## 2014-06-07 NOTE — Telephone Encounter (Signed)
Last f/u appt 12/2013 

## 2014-06-07 NOTE — Telephone Encounter (Signed)
Rx called in to requested pharmacy 

## 2014-06-08 ENCOUNTER — Other Ambulatory Visit: Payer: Self-pay | Admitting: Family Medicine

## 2014-06-12 ENCOUNTER — Ambulatory Visit: Payer: 59 | Admitting: Physical Therapy

## 2014-06-12 DIAGNOSIS — M6281 Muscle weakness (generalized): Secondary | ICD-10-CM

## 2014-06-12 DIAGNOSIS — R262 Difficulty in walking, not elsewhere classified: Secondary | ICD-10-CM

## 2014-06-12 DIAGNOSIS — Z96642 Presence of left artificial hip joint: Secondary | ICD-10-CM

## 2014-06-12 DIAGNOSIS — M2560 Stiffness of unspecified joint, not elsewhere classified: Secondary | ICD-10-CM

## 2014-06-12 NOTE — Therapy (Signed)
[place], [place], [address] Phone: [phone]   Fax:  [phone]  Physical Therapy Treatment  Patient Details  Name: [name] MRN: [id number] Date of Birth: 12/29/1970 Referring Provider:  [name], MD  Encounter Date: 06/12/2014      PT End of Session - 06/12/14 0839    Visit Number 5   Number of Visits 16   Date for PT Re-Evaluation 07/06/14   PT Start Time 0802   PT Stop Time 0852   PT Time Calculation (min) 50 min   Activity Tolerance Patient tolerated treatment well      Past Medical History  Diagnosis Date  . Upper GI bleed   . Ventral hernia   . Hiatal hernia   . History of blood clots 2012    in abdomen  . Dizziness     occasionally  . Joint pain     knees  . GERD (gastroesophageal reflux disease)     takes Protonix bid  . Gastric ulcer     history of  . Constipation     related to pain meds  . Nocturia     depends on amount of fluid he drinks  . History of blood transfusion 2012  . Anxiety     takes Xanax daily prn  . External hemorrhoid, bleeding   . Diabetes mellitus without complication   . Primary localized osteoarthritis of left hip 04/24/2014    Past Surgical History  Procedure Laterality Date  . Abdominal surgery  10/2010    d/t bleeding ulcers; "oversewing of gastric ulcer"  . Esophagogastroduodenoscopy    . Hernia repair  09/04/11    incisional   . Incisional hernia repair  09/04/2011    Procedure: HERNIA REPAIR INCISIONAL;  Surgeon: Madilyn Hook, DO;  Location: [facility];  Service: General;  Laterality: N/A;  debridment calcified mass  . Total hip arthroplasty Left 04/24/2014    Procedure: LEFT TOTAL HIP ARTHROPLASTY;  Surgeon: Marchia Bond, MD;  Location: [facility];  Service: Orthopedics;  Laterality: Left;    There were no vitals filed for this visit.  Visit Diagnosis:  Status post left hip replacement  Muscle weakness of lower extremity  Joint  stiffness  Difficulty in walking      Subjective Assessment - 06/12/14 0808    Subjective Stiffness in the AM contributing to a limp.  Mild soreness.  Able to walk a block or 2.  No cane today.  Last used a week or 2 ago.     Currently in Pain? Yes   Pain Score 3    Pain Location Hip   Pain Orientation Left   Pain Type Surgical pain   Pain Onset More than a month ago   Pain Frequency Constant   Aggravating Factors  move the wrong way;  lying on left side; sit too long   Pain Relieving Factors movement                         [facility] Adult PT Treatment/Exercise - 06/12/14 0811    Knee/Hip Exercises: Aerobic   Stationary Bike Nu-Step LEs only L4  10 min   Knee/Hip Exercises: Machines for Strengthening   Cybex Leg Press 40# 2 legs 15x;20# left only 15x   Knee/Hip Exercises: Standing   Step Down Left;10 reps;Step Height: 4";Hand Hold: 1  lateral 3 steps   Wall Squat 20 reps  ladder walk  Lunge Walking - Round Trips 4   SLS on left with right LE on BOSU plyoball toss 3 min   Walking with Sports Cord pulley side step right and left 6x plate C   Other Standing Knee Exercises side lunging to wall 10x right and left   Other Standing Knee Exercises rocker board heel raises/squats 15x   Cryotherapy   Number Minutes Cryotherapy 10 Minutes   Cryotherapy Location Hip   Type of Cryotherapy Ice pack                  PT Short Term Goals - 06/12/14 4098    PT SHORT TERM GOAL #1   Title Patient will have left knee flexion to 135 degrees needed for greater mobility in/out of the car.   Status Achieved   PT SHORT TERM GOAL #2   Title Patient will be able to walk 1/4 mile with cane needed for community ambulation   Status Achieved   PT SHORT TERM GOAL #3   Title Gait speed with or without cane in 10 m walk at least 1.66m/sec.   Status Achieved           PT Long Term Goals - 06/12/14 1191    PT LONG TERM GOAL #1   Title Patient will be independent in  progressive HEP needed for further ROM and strengthening of left LE   Time 8   Period Weeks   Status On-going   PT LONG TERM GOAL #2   Title Left hip abduction strength improved to 3+/5 to 4-/5 needed for standing and walking longer periods of time for return to work   Time 8   Period Weeks   Status On-going   PT LONG TERM GOAL #3   Title Patient will have quad strength 4+/5 needed for ascending /descending steps reciprocally.   Time 8   Period Weeks   Status On-going   PT LONG TERM GOAL #4   Title FOTO functional outcome score improved from 59% to 41% indicating improved function with less pain.    Time 8   Period Weeks   Status On-going   PT LONG TERM GOAL #5   Title Timed up and Go test improved to 13 sec indicating improved gait speed and functional strength with sit to stand.     Status Achieved               Plan - 06/12/14 0839    Clinical Impression Statement Patient is progressing well with emphasis on gluteal strengthening needed for community ambulation and return to work.  Weakness contributes to limp.  Therapist closely monitoring pain and adherence to hip precautions.   PT Next Visit Plan Continue left LE strengthening especially gluteals;  continue to follow hip precautions        Problem List Patient Active Problem List   Diagnosis Date Noted  . Insomnia 05/30/2014  . Snores 05/30/2014  . Primary localized osteoarthritis of left hip 04/24/2014  . Dizziness 09/20/2013  . Otitis externa of left ear 09/20/2013  . Elevated hemoglobin A1c 11/17/2012  . Erectile dysfunction 08/08/2012  . Ventral hernia 05/26/2011  . H/O: upper GI bleed 05/26/2011  . Anxiety 05/26/2011  . DJD (degenerative joint disease) 10/14/2010  . Gastric ulcer 09/29/2010    Alvera Singh 06/12/2014, 8:48 AM  [facility] [address] [place], [place], [address] Phone: [phone]   Fax:  [place],  PT 06/12/2014 8:48  AM Phone: [phone] Fax: [phone]

## 2014-06-14 ENCOUNTER — Ambulatory Visit: Payer: 59 | Admitting: Physical Therapy

## 2014-06-14 DIAGNOSIS — M6281 Muscle weakness (generalized): Secondary | ICD-10-CM

## 2014-06-14 DIAGNOSIS — M2560 Stiffness of unspecified joint, not elsewhere classified: Secondary | ICD-10-CM

## 2014-06-14 DIAGNOSIS — R262 Difficulty in walking, not elsewhere classified: Secondary | ICD-10-CM

## 2014-06-14 DIAGNOSIS — Z96642 Presence of left artificial hip joint: Secondary | ICD-10-CM

## 2014-06-14 NOTE — Therapy (Signed)
[place], [place], [address] Phone: [phone]   Fax:  [phone]  Physical Therapy Treatment  Patient Details  Name: [name] MRN: [id number] Date of Birth: [birth year] Referring Provider:  [name], MD  Encounter Date: 06/14/2014      PT End of Session - 06/14/14 0959    Visit Number 6   Number of Visits 16   Date for PT Re-Evaluation 07/06/14   PT Start Time 0811   PT Stop Time 0900   PT Time Calculation (min) 49 min   Activity Tolerance Patient tolerated treatment well      Past Medical History  Diagnosis Date  . Upper GI bleed   . Ventral hernia   . Hiatal hernia   . History of blood clots 2012    in abdomen  . Dizziness     occasionally  . Joint pain     knees  . GERD (gastroesophageal reflux disease)     takes Protonix bid  . Gastric ulcer     history of  . Constipation     related to pain meds  . Nocturia     depends on amount of fluid he drinks  . History of blood transfusion 2012  . Anxiety     takes Xanax daily prn  . External hemorrhoid, bleeding   . Diabetes mellitus without complication   . Primary localized osteoarthritis of left hip 04/24/2014    Past Surgical History  Procedure Laterality Date  . Abdominal surgery  10/2010    d/t bleeding ulcers; "oversewing of gastric ulcer"  . Esophagogastroduodenoscopy    . Hernia repair  09/04/11    incisional   . Incisional hernia repair  09/04/2011    Procedure: HERNIA REPAIR INCISIONAL;  Surgeon: Madilyn Hook, DO;  Location: [facility];  Service: General;  Laterality: N/A;  debridment calcified mass  . Total hip arthroplasty Left 04/24/2014    Procedure: LEFT TOTAL HIP ARTHROPLASTY;  Surgeon: Marchia Bond, MD;  Location: [facility];  Service: Orthopedics;  Laterality: Left;    There were no vitals filed for this visit.  Visit Diagnosis:  Status post left hip replacement  Muscle weakness of lower extremity  Joint  stiffness  Difficulty in walking      Subjective Assessment - 06/14/14 0812    Subjective Stiff , sore.  a little more than usual.  Tries to move correctly.   Currently in Pain? Yes   Pain Score 4    Pain Location Hip   Pain Orientation Lateral   Pain Frequency Constant   Aggravating Factors  moving the wrong way, lt side lying, sitting too long   Pain Relieving Factors ice   Effect of Pain on Daily Activities less endurance                         [facility] Adult PT Treatment/Exercise - 06/14/14 0833    Knee/Hip Exercises: Stretches   Active Hamstring Stretch --  Hip abduction stretch sensitive 3 reps 10 seconds hold   Quad Stretch 2 reps;20 seconds  over edge of bed   Knee/Hip Exercises: Machines for Strengthening   Cybex Leg Press 40# 2 legs 15x;20#   Cxbex hip 2 plates abd , each, extension Lt, 10 reps cued    Knee/Hip Exercises: Standing   Forward Step Up Left;2 sets;20 reps   Moist Heat Therapy   Number Minutes Moist Heat 15 Minutes  Moist Heat Location --  Lt hip   Manual Therapy   Manual Therapy --  soft tissue, retrograde,  Pain but feels good.PROM ABD+ ER                  PT Short Term Goals - 06/12/14 0962    PT SHORT TERM GOAL #1   Title Patient will have left knee flexion to 135 degrees needed for greater mobility in/out of the car.   Status Achieved   PT SHORT TERM GOAL #2   Title Patient will be able to walk 1/4 mile with cane needed for community ambulation   Status Achieved   PT SHORT TERM GOAL #3   Title Gait speed with or without cane in 10 m walk at least 1.29m/sec.   Status Achieved           PT Long Term Goals - 06/12/14 8366    PT LONG TERM GOAL #1   Title Patient will be independent in progressive HEP needed for further ROM and strengthening of left LE   Time 8   Period Weeks   Status On-going   PT LONG TERM GOAL #2   Title Left hip abduction strength improved to 3+/5 to 4-/5 needed for standing and walking  longer periods of time for return to work   Time 8   Period Weeks   Status On-going   PT LONG TERM GOAL #3   Title Patient will have quad strength 4+/5 needed for ascending /descending steps reciprocally.   Time 8   Period Weeks   Status On-going   PT LONG TERM GOAL #4   Title FOTO functional outcome score improved from 59% to 41% indicating improved function with less pain.    Time 8   Period Weeks   Status On-going   PT LONG TERM GOAL #5   Title Timed up and Go test improved to 13 sec indicating improved gait speed and functional strength with sit to stand.     Status Achieved               Plan - 06/14/14 1000    Clinical Impression Statement Manual softened hip tissue but made him sore. Strengthening  and stretching continued with patient's full participation even tho he was sore.  He has a "sore bubble "  area proximal lateral quads, smaller post mauual..     PT Next Visit Plan See how he liked heat.  He may come in early to warm up with heat prior to next session.  Massie Maroon has agreed to set him up. (See's  Stacy next visit)  hip strengthning.     Consulted and Agree with Plan of Care Patient        Problem List Patient Active Problem List   Diagnosis Date Noted  . Insomnia 05/30/2014  . Snores 05/30/2014  . Primary localized osteoarthritis of left hip 04/24/2014  . Dizziness 09/20/2013  . Otitis externa of left ear 09/20/2013  . Elevated hemoglobin A1c 11/17/2012  . Erectile dysfunction 08/08/2012  . Ventral hernia 05/26/2011  . H/O: upper GI bleed 05/26/2011  . Anxiety 05/26/2011  . DJD (degenerative joint disease) 10/14/2010  . Gastric ulcer 09/29/2010    Leeanne Butters 06/14/2014, 10:10 AM  [facility] [address] [place], [place], [address] Phone: [phone]   Fax:  [phone]    Melvenia Needles, PTA 06/14/2014 10:10 AM Phone: [phone] Fax: [phone]

## 2014-06-19 ENCOUNTER — Ambulatory Visit: Payer: 59 | Admitting: Physical Therapy

## 2014-06-19 DIAGNOSIS — M6281 Muscle weakness (generalized): Secondary | ICD-10-CM | POA: Diagnosis not present

## 2014-06-19 DIAGNOSIS — M2560 Stiffness of unspecified joint, not elsewhere classified: Secondary | ICD-10-CM

## 2014-06-19 DIAGNOSIS — R262 Difficulty in walking, not elsewhere classified: Secondary | ICD-10-CM

## 2014-06-19 DIAGNOSIS — Z96642 Presence of left artificial hip joint: Secondary | ICD-10-CM

## 2014-06-19 NOTE — Therapy (Signed)
[place], [place], [address] Phone: [phone]   Fax:  [phone]  Physical Therapy Treatment  Patient Details  Name: [name] MRN: [id number] Date of Birth: 08-24-1970 Referring Provider:  [name], MD  Encounter Date: 06/19/2014      PT End of Session - 06/19/14 0850    Visit Number 7   Number of Visits 16   Date for PT Re-Evaluation 07/06/14   PT Start Time 0807   PT Stop Time 0857   PT Time Calculation (min) 50 min   Activity Tolerance Patient tolerated treatment well      Past Medical History  Diagnosis Date  . Upper GI bleed   . Ventral hernia   . Hiatal hernia   . History of blood clots 2012    in abdomen  . Dizziness     occasionally  . Joint pain     knees  . GERD (gastroesophageal reflux disease)     takes Protonix bid  . Gastric ulcer     history of  . Constipation     related to pain meds  . Nocturia     depends on amount of fluid he drinks  . History of blood transfusion 2012  . Anxiety     takes Xanax daily prn  . External hemorrhoid, bleeding   . Diabetes mellitus without complication   . Primary localized osteoarthritis of left hip 04/24/2014    Past Surgical History  Procedure Laterality Date  . Abdominal surgery  10/2010    d/t bleeding ulcers; "oversewing of gastric ulcer"  . Esophagogastroduodenoscopy    . Hernia repair  09/04/11    incisional   . Incisional hernia repair  09/04/2011    Procedure: HERNIA REPAIR INCISIONAL;  Surgeon: Madilyn Hook, DO;  Location: [facility];  Service: General;  Laterality: N/A;  debridment calcified mass  . Total hip arthroplasty Left 04/24/2014    Procedure: LEFT TOTAL HIP ARTHROPLASTY;  Surgeon: Marchia Bond, MD;  Location: [facility];  Service: Orthopedics;  Laterality: Left;    There were no vitals filed for this visit.  Visit Diagnosis:  Status post left hip replacement  Muscle weakness of lower extremity  Joint  stiffness  Difficulty in walking      Subjective Assessment - 06/19/14 0825    Subjective Stiffness in the AM and after prolonged sitting.  Mild limp.   How long can you walk comfortably? 1 mile   Currently in Pain? Yes   Pain Score 1    Pain Location Hip   Pain Orientation Left;Lateral   Pain Type Surgical pain   Pain Frequency Intermittent   Aggravating Factors  sitting too long   Pain Relieving Factors ice                         [facility] Adult PT Treatment/Exercise - 06/19/14 0827    Knee/Hip Exercises: Aerobic   Elliptical L1 4 min   Knee/Hip Exercises: Machines for Strengthening   Cybex Leg Press 20# single leg 3x 10 right/left   Knee/Hip Exercises: Standing   Other Standing Knee Exercises Cybex hip machine 25# abd and extension left 30x each   Knee/Hip Exercises: Supine   Bridges Both;Strengthening;15 reps   Other Supine Knee Exercises HS sets on ball 10x   Other Supine Knee Exercises HS sets with bridge and curls with bridge 10x each   Knee/Hip Exercises: Sidelying   Hip  ABduction 20 reps;10 reps   Knee/Hip Exercises: Prone   Hamstring Curl 1 set;10 reps  with multifidi contract   Cryotherapy   Number Minutes Cryotherapy 10 Minutes   Cryotherapy Location Hip   Type of Cryotherapy Ice pack   Manual Therapy   Manual Therapy Passive ROM;Soft tissue mobilization   Soft tissue mobilization Instrument assisted ITB   Passive ROM contract/relax hip flexors in extended position 6x; supine hip flexor stretch over side of table 90 sec                  PT Short Term Goals - 06/19/14 1606    PT SHORT TERM GOAL #1   Title Patient will have left knee flexion to 135 degrees needed for greater mobility in/out of the car.   Status Achieved   PT SHORT TERM GOAL #2   Title Patient will be able to walk 1/4 mile with cane needed for community ambulation   Status Achieved   PT SHORT TERM GOAL #3   Title Gait speed with or without cane in 10 m walk at  least 1.71m/sec.   Status Achieved           PT Long Term Goals - 06/19/14 1606    PT LONG TERM GOAL #1   Title Patient will be independent in progressive HEP needed for further ROM and strengthening of left LE   Time 8   Period Weeks   Status On-going   PT LONG TERM GOAL #2   Title Left hip abduction strength improved to 3+/5 to 4-/5 needed for standing and walking longer periods of time for return to work   Time 8   Period Weeks   Status On-going   PT LONG TERM GOAL #3   Title Patient will have quad strength 4+/5 needed for ascending /descending steps reciprocally.   Time 8   Period Weeks   Status On-going   PT LONG TERM GOAL #4   Title FOTO functional outcome score improved from 59% to 41% indicating improved function with less pain.    Time 8   Period Weeks   Status On-going   PT LONG TERM GOAL #5   Title Timed up and Go test improved to 13 sec indicating improved gait speed and functional strength with sit to stand.     Status Achieved               Plan - 06/19/14 0851    Clinical Impression Statement Patient states "it doesn't hurt" with exercises.  Reports initial stiffness which improves with manual techniques.  Decreased overall limp.  Therapist closely monitoring all for adherence to hip precautions.  Patient is progressing well with goals and return to function.  Able to walk about a mile.   PT Next Visit Plan Soft tissue work as needed to ITB/lateral quads;  hip flexor stretch; continue hip strengthening especiall hip abductors        Problem List Patient Active Problem List   Diagnosis Date Noted  . Insomnia 05/30/2014  . Snores 05/30/2014  . Primary localized osteoarthritis of left hip 04/24/2014  . Dizziness 09/20/2013  . Otitis externa of left ear 09/20/2013  . Elevated hemoglobin A1c 11/17/2012  . Erectile dysfunction 08/08/2012  . Ventral hernia 05/26/2011  . H/O: upper GI bleed 05/26/2011  . Anxiety 05/26/2011  . DJD (degenerative  joint disease) 10/14/2010  . Gastric ulcer 09/29/2010    Alvera Singh 06/19/2014, 4:09 PM   [facility]  [place], [place], [address] Phone: [phone]   Fax:  [phone]    Ruben Im, [place] 06/19/2014 4:10 PM Phone: [phone] Fax: [phone]

## 2014-06-21 ENCOUNTER — Ambulatory Visit: Payer: 59 | Admitting: Physical Therapy

## 2014-06-21 DIAGNOSIS — M6281 Muscle weakness (generalized): Secondary | ICD-10-CM

## 2014-06-21 DIAGNOSIS — M2560 Stiffness of unspecified joint, not elsewhere classified: Secondary | ICD-10-CM

## 2014-06-21 NOTE — Therapy (Signed)
[place], [place], [address] Phone: [phone]   Fax:  [phone]  Physical Therapy Treatment  Patient Details  Name: [name] MRN: [id number] Date of Birth: 12-16-1970 Referring Provider:  [name], MD  Encounter Date: 06/21/2014      PT End of Session - 06/21/14 1803    Visit Number 8   Number of Visits 16   Date for PT Re-Evaluation 07/06/14   PT Start Time 0800   PT Stop Time 0900   PT Time Calculation (min) 60 min   Activity Tolerance Patient tolerated treatment well   Behavior During Therapy [facility] for tasks assessed/performed      Past Medical History  Diagnosis Date  . Upper GI bleed   . Ventral hernia   . Hiatal hernia   . History of blood clots 2012    in abdomen  . Dizziness     occasionally  . Joint pain     knees  . GERD (gastroesophageal reflux disease)     takes Protonix bid  . Gastric ulcer     history of  . Constipation     related to pain meds  . Nocturia     depends on amount of fluid he drinks  . History of blood transfusion 2012  . Anxiety     takes Xanax daily prn  . External hemorrhoid, bleeding   . Diabetes mellitus without complication   . Primary localized osteoarthritis of left hip 04/24/2014    Past Surgical History  Procedure Laterality Date  . Abdominal surgery  10/2010    d/t bleeding ulcers; "oversewing of gastric ulcer"  . Esophagogastroduodenoscopy    . Hernia repair  09/04/11    incisional   . Incisional hernia repair  09/04/2011    Procedure: HERNIA REPAIR INCISIONAL;  Surgeon: Madilyn Hook, DO;  Location: [facility];  Service: General;  Laterality: N/A;  debridment calcified mass  . Total hip arthroplasty Left 04/24/2014    Procedure: LEFT TOTAL HIP ARTHROPLASTY;  Surgeon: Marchia Bond, MD;  Location: [facility];  Service: Orthopedics;  Laterality: Left;    There were no vitals filed for this visit.  Visit Diagnosis:  Muscle weakness of lower  extremity  Joint stiffness      Subjective Assessment - 06/21/14 0801    Subjective Stiff Laterally.  Limps less.  Heat helps.   Currently in Pain? No/denies   Pain Location Hip                         [facility] Adult PT Treatment/Exercise - 06/21/14 0819    Knee/Hip Exercises: Stretches   Active Hamstring Stretch --  checked, non tight   Hip Flexor Stretch --  passive, prone 3 reps, 30 seconds tight   Knee/Hip Exercises: Aerobic   Stationary Bike Nustep  L7 5 minutes   Knee/Hip Exercises: Plyometrics   Other Plyometric Exercises 4 footed wobble.  10 mini squats, SBA  10   Knee/Hip Exercises: Standing   Heel Raises 3 sets  Single leg   SLS with Vectors Plyotoss 10each from floor , from blue pod  SBA    Walking with Sports Cord 4 way 10 reps each except Side step leading Lt, 6 reps hip pain 3/10   30 LBS   Knee/Hip Exercises: Supine   Bridges Both;3 sets   Bridges Limitations hip flexion extension, small movements   Knee/Hip Exercises: Sidelying   Clams  10  Folded pillow between legs, monitored for pain   Knee/Hip Exercises: Prone   Other Prone Exercises --   Moist Heat Therapy   Number Minutes Moist Heat 15 Minutes   Moist Heat Location --  Lateral Hip                  PT Short Term Goals - 06/19/14 1606    PT SHORT TERM GOAL #1   Title Patient will have left knee flexion to 135 degrees needed for greater mobility in/out of the car.   Status Achieved   PT SHORT TERM GOAL #2   Title Patient will be able to walk 1/4 mile with cane needed for community ambulation   Status Achieved   PT SHORT TERM GOAL #3   Title Gait speed with or without cane in 10 m walk at least 1.41m/sec.   Status Achieved           PT Long Term Goals - 06/19/14 1606    PT LONG TERM GOAL #1   Title Patient will be independent in progressive HEP needed for further ROM and strengthening of left LE   Time 8   Period Weeks   Status On-going   PT LONG TERM GOAL #2    Title Left hip abduction strength improved to 3+/5 to 4-/5 needed for standing and walking longer periods of time for return to work   Time 8   Period Weeks   Status On-going   PT LONG TERM GOAL #3   Title Patient will have quad strength 4+/5 needed for ascending /descending steps reciprocally.   Time 8   Period Weeks   Status On-going   PT LONG TERM GOAL #4   Title FOTO functional outcome score improved from 59% to 41% indicating improved function with less pain.    Time 8   Period Weeks   Status On-going   PT LONG TERM GOAL #5   Title Timed up and Go test improved to 13 sec indicating improved gait speed and functional strength with sit to stand.     Status Achieved               Plan - 06/21/14 1756    Clinical Impression Statement More challanging exercises tolerated today.  No pain post feels exercise soreness.   PT Next Visit Plan continue hip abductor strengthening. balance Return to work tasks.     Consulted and Agree with Plan of Care Patient        Problem List Patient Active Problem List   Diagnosis Date Noted  . Insomnia 05/30/2014  . Snores 05/30/2014  . Primary localized osteoarthritis of left hip 04/24/2014  . Dizziness 09/20/2013  . Otitis externa of left ear 09/20/2013  . Elevated hemoglobin A1c 11/17/2012  . Erectile dysfunction 08/08/2012  . Ventral hernia 05/26/2011  . H/O: upper GI bleed 05/26/2011  . Anxiety 05/26/2011  . DJD (degenerative joint disease) 10/14/2010  . Gastric ulcer 09/29/2010    Vikram Tillett 06/21/2014, 6:04 PM  [facility] [address] [place], [place], [address] Phone: [phone]   Fax:  [phone]

## 2014-06-26 ENCOUNTER — Ambulatory Visit: Payer: 59 | Admitting: Physical Therapy

## 2014-06-26 DIAGNOSIS — M6281 Muscle weakness (generalized): Secondary | ICD-10-CM | POA: Diagnosis not present

## 2014-06-26 NOTE — Therapy (Signed)
[place], [place], [address] Phone: [phone]   Fax:  [phone]  Physical Therapy Treatment  Patient Details  Name: [name] MRN: [id number] Date of Birth: 11/22/1970 Referring Provider:  [name], MD  Encounter Date: 06/26/2014      PT End of Session - 06/26/14 0846    Visit Number 9   Number of Visits 16   Date for PT Re-Evaluation 07/06/14   PT Start Time 0800   PT Stop Time 0900   PT Time Calculation (min) 60 min   Activity Tolerance Patient tolerated treatment well   Behavior During Therapy [facility] for tasks assessed/performed      Past Medical History  Diagnosis Date  . Upper GI bleed   . Ventral hernia   . Hiatal hernia   . History of blood clots 2012    in abdomen  . Dizziness     occasionally  . Joint pain     knees  . GERD (gastroesophageal reflux disease)     takes Protonix bid  . Gastric ulcer     history of  . Constipation     related to pain meds  . Nocturia     depends on amount of fluid he drinks  . History of blood transfusion 2012  . Anxiety     takes Xanax daily prn  . External hemorrhoid, bleeding   . Diabetes mellitus without complication   . Primary localized osteoarthritis of left hip 04/24/2014    Past Surgical History  Procedure Laterality Date  . Abdominal surgery  10/2010    d/t bleeding ulcers; "oversewing of gastric ulcer"  . Esophagogastroduodenoscopy    . Hernia repair  09/04/11    incisional   . Incisional hernia repair  09/04/2011    Procedure: HERNIA REPAIR INCISIONAL;  Surgeon: Madilyn Hook, DO;  Location: [facility];  Service: General;  Laterality: N/A;  debridment calcified mass  . Total hip arthroplasty Left 04/24/2014    Procedure: LEFT TOTAL HIP ARTHROPLASTY;  Surgeon: Marchia Bond, MD;  Location: [place];  Service: Orthopedics;  Laterality: Left;    There were no vitals filed for this visit.  Visit Diagnosis:  Muscle weakness of lower  extremity      Subjective Assessment - 06/26/14 0808    Subjective No stiff today.  Able to jog a little getting balls nephew hit.  Able to move around and catch fly balls sore post, wokr up fine.   Currently in Pain? No/denies   Pain Score 0-No pain                         [facility] Adult PT Treatment/Exercise - 06/26/14 0810    Knee/Hip Exercises: Aerobic   Stationary Bike L7 8 minutes   Knee/Hip Exercises: Machines for Strengthening   Cybex Leg Press --  3 plates both 10 reps, 1 leg 3 sets   Knee/Hip Exercises: Standing   Lateral Step Up Left;2 sets;Hand Hold: 0;Step Height: 6"   Forward Step Up Left;2 sets;Hand Hold: 0;Step Height: 8"   Step Down Left;2 sets;Hand Hold: 1;Step Height: 6"   Wall Squat --  10 reps 10 second holds   Other Standing Knee Exercises ladder climb 3-4 rungs 3 times, on  at a time   Other Standing Knee Exercises sit tostand from 28 inches 1 leg 10 reps cues.     Knee/Hip Exercises: Supine   Bridges PROM;Both;3 sets  Knee/Hip Exercises: Sidelying   Hip ABduction Limitations 4/5 mmt LT   Clams 30  with blue bands.    Moist Heat Therapy   Number Minutes Moist Heat 15 Minutes   Moist Heat Location --  Lt hip in sidelying                  PT Short Term Goals - 06/19/14 1606    PT SHORT TERM GOAL #1   Title Patient will have left knee flexion to 135 degrees needed for greater mobility in/out of the car.   Status Achieved   PT SHORT TERM GOAL #2   Title Patient will be able to walk 1/4 mile with cane needed for community ambulation   Status Achieved   PT SHORT TERM GOAL #3   Title Gait speed with or without cane in 10 m walk at least 1.30m/sec.   Status Achieved           PT Long Term Goals - 06/26/14 0954    PT LONG TERM GOAL #1   Title Patient will be independent in progressive HEP needed for further ROM and strengthening of left LE   Time 8   Period Weeks   Status On-going   PT LONG TERM GOAL #2   Title Left hip  abduction strength improved to 3+/5 to 4-/5 needed for standing and walking longer periods of time for return to work   Baseline 4/5   Time 8   Period Weeks   Status Achieved   PT LONG TERM GOAL #3   Title Patient will have quad strength 4+/5 needed for ascending /descending steps reciprocally.   Time 8   Period Weeks   Status Achieved   PT LONG TERM GOAL #4   Title FOTO functional outcome score improved from 59% to 41% indicating improved function with less pain.    Time 8   Period Weeks   Status Unable to assess   PT LONG TERM GOAL #5   Title Timed up and Go test improved to 13 sec indicating improved gait speed and functional strength with sit to stand.     Status Achieved               Plan - 06/26/14 0847    Clinical Impression Statement no pain post ecercise.  Met hip and knee strength goals.   PT Next Visit Plan continue hip abductor strengthening. balance Return to work tasks.     Consulted and Agree with Plan of Care Patient     FOTO next visit   Problem List Patient Active Problem List   Diagnosis Date Noted  . Insomnia 05/30/2014  . Snores 05/30/2014  . Primary localized osteoarthritis of left hip 04/24/2014  . Dizziness 09/20/2013  . Otitis externa of left ear 09/20/2013  . Elevated hemoglobin A1c 11/17/2012  . Erectile dysfunction 08/08/2012  . Ventral hernia 05/26/2011  . H/O: upper GI bleed 05/26/2011  . Anxiety 05/26/2011  . DJD (degenerative joint disease) 10/14/2010  . Gastric ulcer 09/29/2010    HARRIS,KAREN 06/26/2014, 9:57 AM  [facility] [address] [place], [place], [address] Phone: [phone]   Fax:  [phone]    Melvenia Needles, PTA 06/26/2014 9:57 AM Phone: [phone] Fax: [phone]

## 2014-06-27 ENCOUNTER — Encounter: Payer: Self-pay | Admitting: Family Medicine

## 2014-06-27 ENCOUNTER — Other Ambulatory Visit: Payer: Self-pay | Admitting: Family Medicine

## 2014-06-27 MED ORDER — ZOLPIDEM TARTRATE ER 12.5 MG PO TBCR: 12.5000 mg | EXTENDED_RELEASE_TABLET | Freq: Every evening | ORAL | Status: DC | PRN

## 2014-06-27 NOTE — Addendum Note (Signed)
Addended by: Modena Nunnery on: 06/27/2014 03:40 PM   Modules accepted: Orders

## 2014-06-27 NOTE — Telephone Encounter (Signed)
Last f/u appt 12/2013 

## 2014-06-28 ENCOUNTER — Encounter: Payer: Self-pay | Admitting: Family Medicine

## 2014-06-28 ENCOUNTER — Other Ambulatory Visit: Payer: Self-pay | Admitting: Family Medicine

## 2014-06-28 ENCOUNTER — Ambulatory Visit: Payer: 59 | Admitting: Physical Therapy

## 2014-06-28 DIAGNOSIS — M6281 Muscle weakness (generalized): Secondary | ICD-10-CM | POA: Diagnosis not present

## 2014-06-28 DIAGNOSIS — M2560 Stiffness of unspecified joint, not elsewhere classified: Secondary | ICD-10-CM

## 2014-06-28 DIAGNOSIS — R262 Difficulty in walking, not elsewhere classified: Secondary | ICD-10-CM

## 2014-06-28 DIAGNOSIS — Z96642 Presence of left artificial hip joint: Secondary | ICD-10-CM

## 2014-06-28 NOTE — Therapy (Signed)
[place], [place], [address] Phone: [phone]   Fax:  [phone]  Physical Therapy Treatment  Patient Details  Name: [name] MRN: [id number] Date of Birth: May 10, 1970 Referring Provider:  [name], MD  Encounter Date: 06/28/2014      PT End of Session - 06/28/14 0829    Visit Number 10   Number of Visits 16   Date for PT Re-Evaluation 07/06/14   PT Start Time 3474   PT Stop Time 0850   PT Time Calculation (min) 55 min   Activity Tolerance Patient tolerated treatment well      Past Medical History  Diagnosis Date  . Upper GI bleed   . Ventral hernia   . Hiatal hernia   . History of blood clots 2012    in abdomen  . Dizziness     occasionally  . Joint pain     knees  . GERD (gastroesophageal reflux disease)     takes Protonix bid  . Gastric ulcer     history of  . Constipation     related to pain meds  . Nocturia     depends on amount of fluid he drinks  . History of blood transfusion 2012  . Anxiety     takes Xanax daily prn  . External hemorrhoid, bleeding   . Diabetes mellitus without complication   . Primary localized osteoarthritis of left hip 04/24/2014    Past Surgical History  Procedure Laterality Date  . Abdominal surgery  10/2010    d/t bleeding ulcers; "oversewing of gastric ulcer"  . Esophagogastroduodenoscopy    . Hernia repair  09/04/11    incisional   . Incisional hernia repair  09/04/2011    Procedure: HERNIA REPAIR INCISIONAL;  Surgeon: Madilyn Hook, DO;  Location: [facility];  Service: General;  Laterality: N/A;  debridment calcified mass  . Total hip arthroplasty Left 04/24/2014    Procedure: LEFT TOTAL HIP ARTHROPLASTY;  Surgeon: Marchia Bond, MD;  Location: [facility];  Service: Orthopedics;  Laterality: Left;    There were no vitals filed for this visit.  Visit Diagnosis:  Muscle weakness of lower extremity  Joint stiffness  Status post left hip  replacement  Difficulty in walking      Subjective Assessment - 06/28/14 0754    Subjective Reports a little sore today but doing well overall.  Felt good with what he did last treatment session.  Sees MD next Wednesday.  Patient states almost no pain, mostly stiffness.  Expresses some concern about stepping to the side and worry about hip precautions upon return to work.   Currently in Pain? No/denies   Pain Score 0-No pain   Pain Location Hip   Pain Orientation Left                         [facility] Adult PT Treatment/Exercise - 06/28/14 0810    Knee/Hip Exercises: Stretches   Gastroc Stretch 3 reps;20 seconds   Knee/Hip Exercises: Aerobic   Stationary Bike Nu-Step L6 10 min   Elliptical L1 4 min   Knee/Hip Exercises: Machines for Strengthening   Cybex Leg Press 40# single leg left 3x10   Knee/Hip Exercises: Standing   Heel Raises 2 sets;10 reps   Lateral Step Up Left;1 set;15 reps;Hand Hold: 1;Step Height: 8"   Forward Step Up Left;2 sets;Hand Hold: 0;Step Height: 8"   Lunge Walking - Round Trips 4  SLS with blue TB UE diagnonals   SLS with Vectors Stand on green foam with right 3 ways (no add) 15x   Walking with Sports Cord 5x plate C side stepping right/left   Cryotherapy   Number Minutes Cryotherapy 10 Minutes   Cryotherapy Location Hip   Type of Cryotherapy Ice pack                  PT Short Term Goals - 06/28/14 8341    PT SHORT TERM GOAL #1   Title Patient will have left knee flexion to 135 degrees needed for greater mobility in/out of the car.   Status Achieved   PT SHORT TERM GOAL #2   Title Patient will be able to walk 1/4 mile with cane needed for community ambulation   Status Achieved   PT SHORT TERM GOAL #3   Title Gait speed with or without cane in 10 m walk at least 1.13m/sec.   Status Achieved           PT Long Term Goals - 06/28/14 0835    PT LONG TERM GOAL #1   Title Patient will be independent in progressive HEP needed  for further ROM and strengthening of left LE   Time 8   Period Weeks   Status On-going   PT LONG TERM GOAL #2   Title Left hip abduction strength improved to 3+/5 to 4-/5 needed for standing and walking longer periods of time for return to work   Status Achieved   PT [place] #3   Title Patient will have quad strength 4+/5 needed for ascending /descending steps reciprocally.   Status Achieved   PT LONG TERM GOAL #4   Title FOTO functional outcome score improved from 59% to 41% indicating improved function with less pain.    Time 8   Period Weeks   Status On-going   PT LONG TERM GOAL #5   Title Timed up and Go test improved to 13 sec indicating improved gait speed and functional strength with sit to stand.     Status Achieved               Plan - 06/28/14 0829    Clinical Impression Statement Patient is progressing well with proprioception and strength.  Therapist closely monitoring for adherence to hip precautions and for pain.  Patient reports muscle fatigue but no pain. Sees MD next Wednesday 6/1.  Check remaining goals.  Send MD progress note.  Patient ready for discharge in 1-2 visits.     PT Next Visit Plan continue hip abductor strengthening. balance Return to work tasks.  Check remaining goals. Send MD note for appt 6/1.  Discharge 1-2 visits.  Do FOTO.        Problem List Patient Active Problem List   Diagnosis Date Noted  . Insomnia 05/30/2014  . Snores 05/30/2014  . Primary localized osteoarthritis of left hip 04/24/2014  . Dizziness 09/20/2013  . Otitis externa of left ear 09/20/2013  . Elevated hemoglobin A1c 11/17/2012  . Erectile dysfunction 08/08/2012  . Ventral hernia 05/26/2011  . H/O: upper GI bleed 05/26/2011  . Anxiety 05/26/2011  . DJD (degenerative joint disease) 10/14/2010  . Gastric ulcer 09/29/2010    Alvera Singh 06/28/2014, 9:35 AM  [facility] [address] [place],  [place], [address] Phone: [phone]   Fax:  [phone]

## 2014-06-28 NOTE — Telephone Encounter (Signed)
Rx called in to requested pharmacy 

## 2014-07-03 ENCOUNTER — Ambulatory Visit: Payer: 59 | Admitting: Physical Therapy

## 2014-07-03 DIAGNOSIS — M6281 Muscle weakness (generalized): Secondary | ICD-10-CM | POA: Diagnosis not present

## 2014-07-03 DIAGNOSIS — R262 Difficulty in walking, not elsewhere classified: Secondary | ICD-10-CM

## 2014-07-03 DIAGNOSIS — M2560 Stiffness of unspecified joint, not elsewhere classified: Secondary | ICD-10-CM

## 2014-07-03 NOTE — Therapy (Signed)
[place], [place], [address] Phone: [phone]   Fax:  [phone]  Physical Therapy Treatment  Patient Details  Name: [name] MRN: [id number] Date of Birth: [birth year] Referring Provider:  [name], MD  Encounter Date: 07/03/2014      PT End of Session - 07/03/14 1810    Visit Number 11   Number of Visits 16   Date for PT Re-Evaluation 07/06/14   PT Start Time 0803   PT Stop Time 0850   PT Time Calculation (min) 47 min   Activity Tolerance Patient limited by pain   Behavior During Therapy [facility] for tasks assessed/performed      Past Medical History  Diagnosis Date  . Upper GI bleed   . Ventral hernia   . Hiatal hernia   . History of blood clots 2012    in abdomen  . Dizziness     occasionally  . Joint pain     knees  . GERD (gastroesophageal reflux disease)     takes Protonix bid  . Gastric ulcer     history of  . Constipation     related to pain meds  . Nocturia     depends on amount of fluid he drinks  . History of blood transfusion 2012  . Anxiety     takes Xanax daily prn  . External hemorrhoid, bleeding   . Diabetes mellitus without complication   . Primary localized osteoarthritis of left hip 04/24/2014    Past Surgical History  Procedure Laterality Date  . Abdominal surgery  10/2010    d/t bleeding ulcers; "oversewing of gastric ulcer"  . Esophagogastroduodenoscopy    . Hernia repair  09/04/11    incisional   . Incisional hernia repair  09/04/2011    Procedure: HERNIA REPAIR INCISIONAL;  Surgeon: Madilyn Hook, DO;  Location: [place];  Service: General;  Laterality: N/A;  debridment calcified mass  . Total hip arthroplasty Left 04/24/2014    Procedure: LEFT TOTAL HIP ARTHROPLASTY;  Surgeon: Marchia Bond, MD;  Location: [place];  Service: Orthopedics;  Laterality: Left;    There were no vitals filed for this visit.  Visit Diagnosis:  Muscle weakness of lower  extremity  Joint stiffness  Difficulty in walking      Subjective Assessment - 07/03/14 0809    Subjective Stiff this am.  Limping.   Currently in Pain? Yes   Pain Score 3    Pain Location Hip   Pain Orientation Left;Lateral   Pain Descriptors / Indicators Sore  Stiff   Aggravating Factors  Recumbant, felt popping   Pain Relieving Factors heat   Multiple Pain Sites No                         [facility] Adult PT Treatment/Exercise - 07/03/14 0820    Knee/Hip Exercises: Aerobic   Stationary Bike Nustep 5 minutes., recumbant bike initially, painful lateral hip   Nu step 2 times this session due to this helping his pain   Knee/Hip Exercises: Sidelying   Clams 20   Moist Heat Therapy   Number Minutes Moist Heat 15 Minutes   Moist Heat Location --  lateral hip with some exercise for pain   Cryotherapy   Number Minutes Cryotherapy 6 Minutes   Cryotherapy Location Hip   Type of Cryotherapy --  cold pack while on Nustep   Manual Therapy   Passive ROM Contract  relax. prone hip flexor stretch 4 reps 10 second                   PT Short Term Goals - 06/28/14 1829    PT SHORT TERM GOAL #1   Title Patient will have left knee flexion to 135 degrees needed for greater mobility in/out of the car.   Status Achieved   PT SHORT TERM GOAL #2   Title Patient will be able to walk 1/4 mile with cane needed for community ambulation   Status Achieved   PT SHORT TERM GOAL #3   Title Gait speed with or without cane in 10 m walk at least 1.80m/sec.   Status Achieved           PT Long Term Goals - 07/03/14 1818    PT LONG TERM GOAL #1   Title Patient will be independent in progressive HEP needed for further ROM and strengthening of left LE   Time 8   Period Weeks   Status On-going   PT LONG TERM GOAL #2   Title Left hip abduction strength improved to 3+/5 to 4-/5 needed for standing and walking longer periods of time for return to work   Time 8   Period Weeks    Status Achieved   PT LONG TERM GOAL #3   Title Patient will have quad strength 4+/5 needed for ascending /descending steps reciprocally.   Time 8   Period Weeks   Status Achieved   PT LONG TERM GOAL #4   Title FOTO functional outcome score improved from 59% to 41% indicating improved function with less pain.    Baseline 34%   Time 8   Period Weeks   Status Achieved   PT LONG TERM GOAL #5   Title Timed up and Go test improved to 13 sec indicating improved gait speed and functional strength with sit to stand.     Time 8   Period Weeks   Status Achieved               Plan - 07/03/14 1813    Clinical Impression Statement Lateral hip pain while on recumbant a few revolutions the stopped.  3/10.  He see's MD tomorrow and he will follow up with [place] the next visit.  PT Ruben Im checked hip, everything appears muscular pain,  heat > ice helpful with pain. but it still continued post session        FOTO score 34% limitation   PT Next Visit Plan See what MD says.   Consulted and Agree with Plan of Care Patient        Problem List Patient Active Problem List   Diagnosis Date Noted  . Insomnia 05/30/2014  . Snores 05/30/2014  . Primary localized osteoarthritis of left hip 04/24/2014  . Dizziness 09/20/2013  . Otitis externa of left ear 09/20/2013  . Elevated hemoglobin A1c 11/17/2012  . Erectile dysfunction 08/08/2012  . Ventral hernia 05/26/2011  . H/O: upper GI bleed 05/26/2011  . Anxiety 05/26/2011  . DJD (degenerative joint disease) 10/14/2010  . Gastric ulcer 09/29/2010    Brax Walen 07/03/2014, 6:20 PM  [facility] [address] [place], [place], [address] Phone: [phone]   Fax:  [phone]  Melvenia Needles, PTA 07/03/2014 6:20 PM Phone: [phone] Fax: [phone]

## 2014-07-05 ENCOUNTER — Ambulatory Visit: Payer: 59 | Attending: Orthopedic Surgery | Admitting: Physical Therapy

## 2014-07-05 DIAGNOSIS — R262 Difficulty in walking, not elsewhere classified: Secondary | ICD-10-CM | POA: Diagnosis present

## 2014-07-05 DIAGNOSIS — M2560 Stiffness of unspecified joint, not elsewhere classified: Secondary | ICD-10-CM | POA: Insufficient documentation

## 2014-07-05 DIAGNOSIS — M6281 Muscle weakness (generalized): Secondary | ICD-10-CM | POA: Diagnosis present

## 2014-07-05 DIAGNOSIS — Z9660 Presence of unspecified orthopedic joint implant: Secondary | ICD-10-CM | POA: Diagnosis present

## 2014-07-05 DIAGNOSIS — Z96642 Presence of left artificial hip joint: Secondary | ICD-10-CM

## 2014-07-05 NOTE — Therapy (Signed)
[place], [place], [address] Phone: [phone]   Fax:  [phone]  Physical Therapy Treatment/Discharge Summary  Patient Details  Name: [name] MRN: [id number] Date of Birth: 03-16-1970 Referring Provider:  [name], MD  Encounter Date: 07/05/2014      PT End of Session - 07/05/14 0809    Visit Number 12   Number of Visits 16   Date for PT Re-Evaluation 07/06/14   PT Start Time 0800   PT Stop Time 0845   PT Time Calculation (min) 45 min   Activity Tolerance Patient tolerated treatment well      Past Medical History  Diagnosis Date  . Upper GI bleed   . Ventral hernia   . Hiatal hernia   . History of blood clots 2012    in abdomen  . Dizziness     occasionally  . Joint pain     knees  . GERD (gastroesophageal reflux disease)     takes Protonix bid  . Gastric ulcer     history of  . Constipation     related to pain meds  . Nocturia     depends on amount of fluid he drinks  . History of blood transfusion 2012  . Anxiety     takes Xanax daily prn  . External hemorrhoid, bleeding   . Diabetes mellitus without complication   . Primary localized osteoarthritis of left hip 04/24/2014    Past Surgical History  Procedure Laterality Date  . Abdominal surgery  10/2010    d/t bleeding ulcers; "oversewing of gastric ulcer"  . Esophagogastroduodenoscopy    . Hernia repair  09/04/11    incisional   . Incisional hernia repair  09/04/2011    Procedure: HERNIA REPAIR INCISIONAL;  Surgeon: Madilyn Hook, DO;  Location: [place];  Service: General;  Laterality: N/A;  debridment calcified mass  . Total hip arthroplasty Left 04/24/2014    Procedure: LEFT TOTAL HIP ARTHROPLASTY;  Surgeon: Marchia Bond, MD;  Location: [facility];  Service: Orthopedics;  Laterality: Left;    There were no vitals filed for this visit.  Visit Diagnosis:  Muscle weakness of lower extremity  Joint stiffness  Difficulty  in walking  Status post left hip replacement      Subjective Assessment - 07/05/14 0801    Subjective Saw the doctor and he said everything looked good.  States the doctor said everything looked good.  Return to work full duty 6/13.  Sees the doctor in 6 weeks.  Swam yesterday with the kids.    Currently in Pain? Yes   Pain Score 2    Pain Location Hip   Pain Orientation Left;Lateral   Pain Type Surgical pain   Pain Onset More than a month ago   Pain Frequency Intermittent   Aggravating Factors  being up on it    Pain Relieving Factors heat, ice            [facility] PT Assessment - 07/05/14 0834    Strength   Left Hip Extension 4/5   Left Hip ABduction 4/5                     [facility] Adult PT Treatment/Exercise - 07/05/14 0807    Knee/Hip Exercises: Aerobic   Stationary Bike Nu-Step L5 8 min LEs only   Knee/Hip Exercises: Machines for Strengthening   Cybex Leg Press 40# single leg left 3x10   Knee/Hip Exercises:  Standing   Hip ADduction Strengthening;Right;Left;1 set;15 reps   Other Standing Knee Exercises sit to stand with red band around knees 15x   Other Standing Knee Exercises red band side step 20x   Knee/Hip Exercises: Supine   Bridges AROM;Both;1 set;10 reps   Other Supine Knee Exercises Hip abd/ER with red  band 25x   Knee/Hip Exercises: Sidelying   Clams red band 20x   Knee/Hip Exercises: Prone   Hamstring Curl 1 set;10 reps  with multifidi contract   Hip Extension Left;15 reps;Right                PT Education - 07/05/14 0807    Education provided Yes   Education Details Discussion of HEP and safe self progression upon discharge from PT including appropriate pool exercises   Person(s) Educated Patient   Methods Explanation;Demonstration   Comprehension Verbalized understanding;Returned demonstration          PT Short Term Goals - 07/05/14 0834    PT SHORT TERM GOAL #1   Title Patient will have left knee flexion to 135 degrees  needed for greater mobility in/out of the car.   Status Achieved   PT SHORT TERM GOAL #2   Title Patient will be able to walk 1/4 mile with cane needed for community ambulation   Status Achieved   PT SHORT TERM GOAL #3   Status Achieved           PT Long Term Goals - 07/05/14 0834    PT LONG TERM GOAL #1   Title Patient will be independent in progressive HEP needed for further ROM and strengthening of left LE   Status Achieved   PT LONG TERM GOAL #2   Title Left hip abduction strength improved to 3+/5 to 4-/5 needed for standing and walking longer periods of time for return to work   Status Achieved   PT [place] #3   Title Patient will have quad strength 4+/5 needed for ascending /descending steps reciprocally.   Status Achieved   PT LONG TERM GOAL #4   Title FOTO functional outcome score improved from 59% to 41% indicating improved function with less pain.    Status Achieved   PT LONG TERM GOAL #5   Title Timed up and Go test improved to 13 sec indicating improved gait speed and functional strength with sit to stand.     Status Achieved               Plan - 07/05/14 0809    Clinical Impression Statement Patient reports stiffness with static positions but overall pain is mild 2/10.  All rehab goals met.  Patient reports a good understanding of safe, self progression and hip precautions.   Recommend discharge from PT to an independent HEP.        Problem List Patient Active Problem List   Diagnosis Date Noted  . Insomnia 05/30/2014  . Snores 05/30/2014  . Primary localized osteoarthritis of left hip 04/24/2014  . Dizziness 09/20/2013  . Otitis externa of left ear 09/20/2013  . Elevated hemoglobin A1c 11/17/2012  . Erectile dysfunction 08/08/2012  . Ventral hernia 05/26/2011  . H/O: upper GI bleed 05/26/2011  . Anxiety 05/26/2011  . DJD (degenerative joint disease) 10/14/2010  . Gastric ulcer 09/29/2010    Alvera Singh 07/05/2014, 12:27  PM  [facility] [address] [place], [place], [address] Phone: [phone]   Fax:  [phone]   PHYSICAL THERAPY  DISCHARGE SUMMARY  Visits from Start of Care: 12  Current functional level related to goals / functional outcomes: Patient met all short term and long term goals.   Remaining deficits: Some mild pain and stiffness with prolonged sitting/static positions, improves with movement.   Education / Equipment: HEP including red band Plan: Patient agrees to discharge.  Patient goals were met. Patient is being discharged due to meeting the stated rehab goals.   ?Ruben Im, PT 07/05/2014 12:29 PM Phone: [phone] Fax: [phone]??

## 2014-07-09 ENCOUNTER — Other Ambulatory Visit: Payer: Self-pay | Admitting: Family Medicine

## 2014-07-09 MED ORDER — CLONAZEPAM 0.25 MG PO TBDP: 0.2500 mg | ORAL_TABLET | Freq: Every evening | ORAL | Status: DC | PRN

## 2014-07-09 NOTE — Telephone Encounter (Signed)
Last f/u appt 12/2013 

## 2014-07-09 NOTE — Telephone Encounter (Signed)
Rx called in to requested pharmacy 

## 2014-07-10 ENCOUNTER — Other Ambulatory Visit: Payer: Self-pay | Admitting: Family Medicine

## 2014-07-27 ENCOUNTER — Other Ambulatory Visit: Payer: Self-pay | Admitting: Family Medicine

## 2014-07-27 MED ORDER — ZOLPIDEM TARTRATE ER 12.5 MG PO TBCR: 12.5000 mg | EXTENDED_RELEASE_TABLET | Freq: Every evening | ORAL | Status: DC | PRN

## 2014-07-27 NOTE — Telephone Encounter (Signed)
Pt has not had any recent f/u appts 

## 2014-07-27 NOTE — Telephone Encounter (Signed)
Rx called in to requested pharmacy 

## 2014-08-02 ENCOUNTER — Ambulatory Visit (INDEPENDENT_AMBULATORY_CARE_PROVIDER_SITE_OTHER): Payer: 59 | Admitting: Pulmonary Disease

## 2014-08-02 ENCOUNTER — Encounter: Payer: Self-pay | Admitting: Pulmonary Disease

## 2014-08-02 VITALS — BP 138/88 | HR 96 | Ht 69.0 in | Wt 208.0 lb

## 2014-08-02 DIAGNOSIS — F4323 Adjustment disorder with mixed anxiety and depressed mood: Secondary | ICD-10-CM

## 2014-08-02 DIAGNOSIS — G4733 Obstructive sleep apnea (adult) (pediatric): Secondary | ICD-10-CM

## 2014-08-02 DIAGNOSIS — G4700 Insomnia, unspecified: Secondary | ICD-10-CM | POA: Diagnosis not present

## 2014-08-02 NOTE — Progress Notes (Signed)
Chief Complaint  Patient presents with  . SLEEP CONSULT    Referred by Dr Arnette Norris. Epworth Score: 0    History of Present Illness: [name] is a 44 y.o. male for evaluation of sleep problems.  He has noticed trouble with his sleep.  This has been going on for a while, but seems to be getting worse over the past 1 year.  He feels tired during the day, especially in the morning.  He goes to bed at 10 pm.  He takes [place] before going to bed.  This isn't working as well as before.  He also takes klonopin 2 to 3 times per week.  Again not working as well as before.  He can take up to 2 hours to fall asleep.  When he can't sleep he will watch netflix on his ipad in bed.  He has lots of random thoughts while laying in bed, and has trouble shutting his mind down.  Once he is asleep he will then wake up after 2 hours.  He wakes up feeling like he can't breath.  He has been told he snores, and stops breathing while asleep.  His mouth gets dry at night, and he has trouble sleeping on his back.  He was having trouble with hip pain.  He had Lt hip replacement in March.  His pain is better, and he does not use percocet much.  He gets out of bed at 530 am.  He feels tired in the morning.  He used to need energy drinks to stay awake, but had to stop these after he had problems with his stomach.  He has been feeling more depressed and anxious.  He has been taking buspar, but not as helpful as before.  He saw a counselor about 1 yr ago >> was too expensive to f/u, and wasn't sure how much this helped.   He denies sleep walking, sleep talking, bruxism, or nightmares.  There is no history of restless legs.  He denies sleep hallucinations, sleep paralysis, or cataplexy.  The Epworth score is 0 out of 24.   [name]  has a past medical history of Upper GI bleed; Ventral hernia; Hiatal hernia; History of blood clots (2012); Dizziness; Joint pain; GERD (gastroesophageal reflux disease); Gastric ulcer;  Constipation; Nocturia; History of blood transfusion (2012); Anxiety; External hemorrhoid, bleeding; Diabetes mellitus without complication; and Primary localized osteoarthritis of left hip (04/24/2014).  [name]  has past surgical history that includes Abdominal surgery (10/2010); Esophagogastroduodenoscopy; Hernia repair (09/04/11); Incisional hernia repair (09/04/2011); and Total hip arthroplasty (Left, 04/24/2014).  Prior to Admission medications   Medication Sig Start Date End Date Taking? Authorizing Provider  busPIRone (BUSPAR) 30 MG tablet Take 1 tablet (30 mg total) by mouth 2 (two) times daily. 03/26/14  Yes [name], MD  clonazePAM (KLONOPIN) 0.25 MG disintegrating tablet Take 1 tablet (0.25 mg total) by mouth at bedtime as needed (insomnia, panic attack). 07/09/14  Yes [name], MD  oxyCODONE-acetaminophen (PERCOCET) 10-325 MG per tablet Take 1-2 tablets by mouth every 6 (six) hours as needed for pain. MAXIMUM TOTAL ACETAMINOPHEN DOSE IS 4000 MG PER DAY 04/24/14  Yes Marchia Bond, MD  pantoprazole (PROTONIX) 40 MG tablet TAKE 1 TABLET BY MOUTH TWICE DAILY 07/10/14  Yes [name], MD  zolpidem (AMBIEN CR) 12.5 MG CR tablet Take 1 tablet (12.5 mg total) by mouth at bedtime as needed for sleep. 07/27/14  Yes [name], MD  Allergies  Allergen Reactions  . Dilaudid [Hydromorphone Hcl] Rash and Other (See Comments)    Shakey  . Metoclopramide Hcl Hives, Swelling and Rash    Swelling in lips    His family history includes Diabetes in his father and mother; Heart disease in his father.  He  reports that he has never smoked. His smokeless tobacco use includes Chew. He reports that he drinks alcohol. He reports that he does not use illicit drugs.  Review of Systems  Constitutional: Positive for unexpected weight change. Negative for fever.  HENT: Negative for congestion, dental problem, ear pain, nosebleeds, postnasal drip, rhinorrhea, sinus pressure, sneezing, sore throat and  trouble swallowing.   Eyes: Negative for redness and itching.  Respiratory: Negative for cough, chest tightness, shortness of breath and wheezing.   Cardiovascular: Negative for palpitations and leg swelling.       Acid Heartburn//Indigestion  Gastrointestinal: Negative for nausea and vomiting.  Genitourinary: Negative for dysuria.  Musculoskeletal: Negative for joint swelling.  Skin: Negative for rash.  Neurological: Negative for headaches.  Hematological: Does not bruise/bleed easily.  Psychiatric/Behavioral: Negative for dysphoric mood. The patient is nervous/anxious.    Physical Exam: BP 138/88 mmHg  Pulse 96  Ht 5\' 9"  (1.753 m)  Wt 208 lb (94.348 kg)  BMI 30.70 kg/m2  SpO2 96%  General - No distress ENT - No sinus tenderness, no oral exudate, no LAN, no thyromegaly, TM clear, pupils equal/reactive, MP 3 Cardiac - s1s2 regular, no murmur, pulses symmetric Chest - No wheeze/rales/dullness, good air entry, normal respiratory excursion Back - No focal tenderness Abd - Soft, non-tender, no organomegaly, + bowel sounds Ext - No edema Neuro - Normal strength, cranial nerves intact Skin - No rashes Psych - Normal mood, and behavior  Discussion: He has trouble falling asleep and staying asleep.  He is not having same benefit from sleep aides as before.  He is having trouble with his mood, and has been feeling more anxious and depressed.  He has trouble shutting his mind off when he tries to sleep.  He also has snoring, sleep disruption, witnessed apnea, and daytime sleepiness.  He could also have sleep apnea in addition to insomnia.  In fact some of his difficulties with "insomnia" could be related to arousals from apneic episodes.  Assessment/plan:  Insomnia. Plan: - discussed sleep restriction, stimulus control, and relaxation techniques - reviewed appropriate expectations from sleep - asked him to keep a sleep diary - reviewed proper sleep hygiene - continue ambien CR and  klonopin prn for now  Anxiety and depression. Plan: - he is to f/u with his PCP  Obstructive sleep apnea. Plan: - will further assess with in lab sleep study   Chesley Mires, M.D. Pager [phone]

## 2014-08-02 NOTE — Patient Instructions (Signed)
Try keeping a sleep diary Will arrange for sleep study Will call to arrange for follow up after sleep study reviewed

## 2014-08-02 NOTE — Progress Notes (Deleted)
   Subjective:    Patient ID: [name], male    DOB: 1970-05-06, 44 y.o.   MRN: [id number]  HPI    Review of Systems  Constitutional: Positive for unexpected weight change. Negative for fever.  HENT: Negative for congestion, dental problem, ear pain, nosebleeds, postnasal drip, rhinorrhea, sinus pressure, sneezing, sore throat and trouble swallowing.   Eyes: Negative for redness and itching.  Respiratory: Negative for cough, chest tightness, shortness of breath and wheezing.   Cardiovascular: Negative for palpitations and leg swelling.       Acid Heartburn//Indigestion  Gastrointestinal: Negative for nausea and vomiting.  Genitourinary: Negative for dysuria.  Musculoskeletal: Negative for joint swelling.  Skin: Negative for rash.  Neurological: Negative for headaches.  Hematological: Does not bruise/bleed easily.  Psychiatric/Behavioral: Negative for dysphoric mood. The patient is nervous/anxious.        Objective:   Physical Exam        Assessment & Plan:

## 2014-08-07 ENCOUNTER — Ambulatory Visit (HOSPITAL_BASED_OUTPATIENT_CLINIC_OR_DEPARTMENT_OTHER): Payer: 59 | Attending: Pulmonary Disease

## 2014-08-07 VITALS — Ht 69.0 in | Wt 200.0 lb

## 2014-08-07 DIAGNOSIS — R0683 Snoring: Secondary | ICD-10-CM | POA: Diagnosis not present

## 2014-08-07 DIAGNOSIS — G4733 Obstructive sleep apnea (adult) (pediatric): Secondary | ICD-10-CM | POA: Insufficient documentation

## 2014-08-07 DIAGNOSIS — G4710 Hypersomnia, unspecified: Secondary | ICD-10-CM

## 2014-08-08 ENCOUNTER — Other Ambulatory Visit: Payer: Self-pay | Admitting: Family Medicine

## 2014-08-08 MED ORDER — CLONAZEPAM 0.25 MG PO TBDP: 0.2500 mg | ORAL_TABLET | Freq: Every evening | ORAL | Status: DC | PRN

## 2014-08-08 NOTE — Telephone Encounter (Signed)
Last f/u appt 12/2013 

## 2014-08-08 NOTE — Telephone Encounter (Signed)
Dr Deborra Medina last note from 05/29/14 reviewed. Ok to phone in clonazepam

## 2014-08-10 ENCOUNTER — Other Ambulatory Visit: Payer: Self-pay | Admitting: Family Medicine

## 2014-08-12 DIAGNOSIS — G4710 Hypersomnia, unspecified: Secondary | ICD-10-CM | POA: Diagnosis not present

## 2014-08-13 NOTE — Telephone Encounter (Signed)
Rx called into pharmacy as directed.

## 2014-08-14 ENCOUNTER — Telehealth: Payer: Self-pay | Admitting: Pulmonary Disease

## 2014-08-14 NOTE — Progress Notes (Signed)
Patient Name: Bruce, [name] Date: 08/07/2014 Gender: Male D.O.B: 1970-11-12 Age (years): 37 Referring Provider: Chesley Mires MD, ABSM Height (inches): 69 Interpreting Physician: Chesley Mires MD, ABSM Weight (lbs): 200 RPSGT: Joni Reining BMI: 30 MRN: [id number] Neck Size: 16.00  CLINICAL INFORMATION Sleep Study Type: NPSG  Indication for sleep study: OSA  Epworth Sleepiness Score: 5  SLEEP STUDY TECHNIQUE As per the AASM Manual for the Scoring of Sleep and Associated Events v2.3 (April 2016) with a hypopnea requiring 4% desaturations.  The channels recorded and monitored were frontal, central and occipital EEG, electrooculogram (EOG), submentalis EMG (chin), nasal and oral airflow, thoracic and abdominal wall motion, anterior tibialis EMG, snore microphone, electrocardiogram, and pulse oximetry.  MEDICATIONS Patient's medications include: buspar, klonopin, percocet, protonix, ambien. Medications self-administered by patient during sleep study : No sleep medicine administered.  SLEEP ARCHITECTURE The study was initiated at 10:51:24 PM and ended at 5:17:22 AM.  Sleep onset time was 150.3 minutes and the sleep efficiency was 60.8%. The total sleep time was 234.7 minutes.  Stage REM latency was 40.5 minutes.  The patient spent 1.28% of the night in stage N1 sleep, 50.99% in stage N2 sleep, 7.24% in stage N3 and 40.49% in REM.  Alpha intrusion was absent.  Supine sleep was 60.89%.  RESPIRATORY PARAMETERS The overall apnea/hypopnea index (AHI) was 0.5 per hour. There were 2 total apneas, including 0 obstructive, 2 central and 0 mixed apneas. There were 0 hypopneas and 0 RERAs.  The AHI during Stage REM sleep was 1.3 per hour.  AHI while supine was 0.4 per hour.  The mean oxygen saturation was 92.49%. The minimum SpO2 during sleep was 87.00%.  Soft snoring was noted during this study.  CARDIAC DATA The 2 lead EKG demonstrated sinus rhythm. The mean heart rate was  69.28 beats per minute. Other EKG findings include: None.  LEG MOVEMENT DATA The total PLMS were 272 with a resulting PLMS index of 69.55. Associated arousal with leg movement index was 1.8 .  IMPRESSIONS No significant obstructive sleep apnea occurred during this study (AHI = 0.5/h). No significant central sleep apnea occurred during this study (CAI = 0.5/h). Mild oxygen desaturation was noted during this study (Min O2 = 87.00%). The patient snored with Soft snoring volume. No cardiac abnormalities were noted during this study. Severe periodic limb movements of sleep occurred during the study. No significant associated arousals. test 2 of template  DIAGNOSIS While there were several respiratory events, these did not reach level to qualify for diagnosis of sleep apnea.  He had difficulty with sleep onset which is consistent with his diagnosis of insomnia.  He had a significant increase in percentage for REM sleep.  RECOMMENDATIONS Avoid alcohol, sedatives and other CNS depressants that may worsen sleep apnea and disrupt normal sleep architecture. Sleep hygiene should be reviewed to assess factors that may improve sleep quality. Weight management and regular exercise should be initiated or continued if appropriate. Further management with Cognitive Behavioral Therapy for Insomnia.

## 2014-08-14 NOTE — Telephone Encounter (Signed)
PSG 08/07/14 >> AHI 0.5, SpO2 low 87%.  REM % 40.49.  Will have my nurse schedule ROV to review sleep study results.

## 2014-08-14 NOTE — Addendum Note (Signed)
Addended by: Chesley Mires on: 08/14/2014 11:47 AM   Modules accepted: Level of Service

## 2014-08-15 NOTE — Telephone Encounter (Signed)
LVM for pt to return call

## 2014-08-21 NOTE — Telephone Encounter (Addendum)
LMTCB x 2 Needs OV to review sleep study results with VS

## 2014-08-22 NOTE — Telephone Encounter (Signed)
Pt returning call, made him an appointment to review sleep study 7/25@9 .[name]'

## 2014-08-27 ENCOUNTER — Other Ambulatory Visit: Payer: Self-pay | Admitting: Family Medicine

## 2014-08-27 ENCOUNTER — Encounter: Payer: Self-pay | Admitting: Family Medicine

## 2014-08-27 ENCOUNTER — Ambulatory Visit: Payer: 59 | Admitting: Pulmonary Disease

## 2014-08-27 MED ORDER — ZOLPIDEM TARTRATE ER 12.5 MG PO TBCR: 12.5000 mg | EXTENDED_RELEASE_TABLET | Freq: Every evening | ORAL | Status: DC | PRN

## 2014-08-27 NOTE — Telephone Encounter (Signed)
Rx called in to requested pharmacy 

## 2014-08-27 NOTE — Telephone Encounter (Signed)
Last f/u appt 05/2014 

## 2014-09-10 ENCOUNTER — Encounter: Payer: Self-pay | Admitting: Family Medicine

## 2014-09-10 MED ORDER — CLONAZEPAM 0.25 MG PO TBDP: ORAL_TABLET | ORAL | Status: DC

## 2014-09-10 NOTE — Telephone Encounter (Signed)
Last f/u appt 05/2014 

## 2014-09-10 NOTE — Telephone Encounter (Signed)
Rx called in to requested pharmacy 

## 2014-09-24 ENCOUNTER — Encounter: Payer: Self-pay | Admitting: Family Medicine

## 2014-09-24 MED ORDER — ZOLPIDEM TARTRATE ER 12.5 MG PO TBCR: 12.5000 mg | EXTENDED_RELEASE_TABLET | Freq: Every evening | ORAL | Status: DC | PRN

## 2014-09-24 NOTE — Telephone Encounter (Signed)
Last f/u appt 05/2014 

## 2014-09-24 NOTE — Telephone Encounter (Signed)
Rx called in to requested pharmacy 

## 2014-10-04 ENCOUNTER — Ambulatory Visit: Payer: 59 | Admitting: Pulmonary Disease

## 2014-10-10 ENCOUNTER — Encounter: Payer: Self-pay | Admitting: Family Medicine

## 2014-10-10 NOTE — Telephone Encounter (Signed)
Last f/u appt 05/2014 

## 2014-10-11 ENCOUNTER — Encounter: Payer: Self-pay | Admitting: Family Medicine

## 2014-10-11 NOTE — Telephone Encounter (Signed)
Last office visit 05/30/2014.  Last refilled 09/10/2014 for #30 with no refills.  Ok to refill?

## 2014-10-12 MED ORDER — CLONAZEPAM 0.25 MG PO TBDP: ORAL_TABLET | ORAL | Status: DC

## 2014-10-12 NOTE — Telephone Encounter (Signed)
Called into [facility].

## 2014-10-23 ENCOUNTER — Encounter: Payer: Self-pay | Admitting: Family Medicine

## 2014-10-23 MED ORDER — ZOLPIDEM TARTRATE ER 12.5 MG PO TBCR: 12.5000 mg | EXTENDED_RELEASE_TABLET | Freq: Every evening | ORAL | Status: DC | PRN

## 2014-10-23 NOTE — Telephone Encounter (Signed)
Last f/u 05/2014 

## 2014-10-23 NOTE — Telephone Encounter (Signed)
Rx called in to requested pharmacy 

## 2014-10-24 ENCOUNTER — Encounter (HOSPITAL_BASED_OUTPATIENT_CLINIC_OR_DEPARTMENT_OTHER): Payer: 59

## 2014-11-01 ENCOUNTER — Encounter: Payer: Self-pay | Admitting: Family Medicine

## 2014-11-02 MED ORDER — BUSPIRONE HCL 30 MG PO TABS: 30.0000 mg | ORAL_TABLET | Freq: Two times a day (BID) | ORAL | Status: DC

## 2014-11-12 ENCOUNTER — Encounter: Payer: Self-pay | Admitting: Family Medicine

## 2014-11-12 MED ORDER — CLONAZEPAM 0.25 MG PO TBDP: ORAL_TABLET | ORAL | Status: DC

## 2014-11-12 NOTE — Telephone Encounter (Signed)
Last f/u appt 05/2014 

## 2014-11-12 NOTE — Telephone Encounter (Signed)
Rx called in to requested pharmacy 

## 2014-11-19 ENCOUNTER — Other Ambulatory Visit: Payer: Self-pay | Admitting: Family Medicine

## 2014-11-19 MED ORDER — ZOLPIDEM TARTRATE ER 12.5 MG PO TBCR: 12.5000 mg | EXTENDED_RELEASE_TABLET | Freq: Every evening | ORAL | Status: DC | PRN

## 2014-11-19 NOTE — Telephone Encounter (Signed)
Rx called in to requested pharmacy 

## 2014-11-19 NOTE — Telephone Encounter (Signed)
Last f/u 05/2014 

## 2014-11-22 ENCOUNTER — Ambulatory Visit (INDEPENDENT_AMBULATORY_CARE_PROVIDER_SITE_OTHER): Payer: 59 | Admitting: Family Medicine

## 2014-11-22 ENCOUNTER — Encounter: Payer: Self-pay | Admitting: Family Medicine

## 2014-11-22 VITALS — BP 120/82 | HR 91 | Temp 98.1°F | Wt 217.5 lb

## 2014-11-22 DIAGNOSIS — R7309 Other abnormal glucose: Secondary | ICD-10-CM | POA: Diagnosis not present

## 2014-11-22 DIAGNOSIS — G4700 Insomnia, unspecified: Secondary | ICD-10-CM

## 2014-11-22 DIAGNOSIS — F419 Anxiety disorder, unspecified: Secondary | ICD-10-CM

## 2014-11-22 LAB — HEMOGLOBIN A1C: HEMOGLOBIN A1C: 6 % (ref 4.6–6.5)

## 2014-11-22 MED ORDER — CLONAZEPAM 0.25 MG PO TBDP: ORAL_TABLET | ORAL | Status: DC

## 2014-11-22 NOTE — Progress Notes (Signed)
Subjective:   Patient ID: [name], male    DOB: [birth year], 44 y.o.   MRN: [id number]  [name] is a pleasant 44 y.o. year old male who presents to clinic today with Follow-up  on 11/22/2014  HPI:  Anxiety- feels he is more anxious lately.  Stopped taking Buspar.  Felt it was not effective.  Sleeping well with ambien and klonipin.  Asks if he can take Klonipin twice daily as needed.  Denies feeling depressed.  Appetite good- Wt Readings from Last 3 Encounters:  11/22/14 217 lb 8 oz (98.657 kg)  08/07/14 200 lb (90.719 kg)  08/02/14 208 lb (94.348 kg)   No SI or HI.  Wants a1c checked- has not been taking Metformin. Lab Results  Component Value Date   HGBA1C 5.4 05/30/2014   Current Outpatient Prescriptions on File Prior to Visit  Medication Sig Dispense Refill  . busPIRone (BUSPAR) 30 MG tablet Take 1 tablet (30 mg total) by mouth 2 (two) times daily. 180 tablet 1  . oxyCODONE-acetaminophen (PERCOCET) 10-325 MG per tablet Take 1-2 tablets by mouth every 6 (six) hours as needed for pain. MAXIMUM TOTAL ACETAMINOPHEN DOSE IS 4000 MG PER DAY 75 tablet 0  . pantoprazole (PROTONIX) 40 MG tablet TAKE 1 TABLET BY MOUTH TWICE DAILY 180 tablet 1  . zolpidem (AMBIEN CR) 12.5 MG CR tablet Take 1 tablet (12.5 mg total) by mouth at bedtime as needed for sleep. 30 tablet 0   No current facility-administered medications on file prior to visit.    Allergies  Allergen Reactions  . Dilaudid [Hydromorphone Hcl] Rash and Other (See Comments)    Shakey  . Metoclopramide Hcl Hives, Swelling and Rash    Swelling in lips    Past Medical History  Diagnosis Date  . Upper GI bleed   . Ventral hernia   . Hiatal hernia   . History of blood clots 2012    in abdomen  . Dizziness     occasionally  . Joint pain     knees  . GERD (gastroesophageal reflux disease)     takes Protonix bid  . Gastric ulcer     history of  . Constipation     related to pain meds  . Nocturia    depends on amount of fluid he drinks  . History of blood transfusion 2012  . Anxiety     takes Xanax daily prn  . External hemorrhoid, bleeding   . Diabetes mellitus without complication ([place])   . Primary localized osteoarthritis of left hip 04/24/2014    Past Surgical History  Procedure Laterality Date  . Abdominal surgery  10/2010    d/t bleeding ulcers; "oversewing of gastric ulcer"  . Esophagogastroduodenoscopy    . Hernia repair  09/04/11    incisional   . Incisional hernia repair  09/04/2011    Procedure: HERNIA REPAIR INCISIONAL;  Surgeon: Madilyn Hook, DO;  Location: [place];  Service: General;  Laterality: N/A;  debridment calcified mass  . Total hip arthroplasty Left 04/24/2014    Procedure: LEFT TOTAL HIP ARTHROPLASTY;  Surgeon: Marchia Bond, MD;  Location: [place];  Service: Orthopedics;  Laterality: Left;    Family History  Problem Relation Age of Onset  . Diabetes Mother   . Diabetes Father   . Heart disease Father     Social History   Social History  . Marital Status: Single    Spouse Name: N/A  . Number of Children: N/A  .  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 Smoking status: Never Smoker   . Smokeless tobacco: Current User    Types: Chew     Comment: occ-Chew  . Alcohol Use: 0.0 oz/week    0 Standard drinks or equivalent per week     Comment: 09/04/11 "very seldom"  . Drug Use: No  . Sexual Activity: Yes   Other Topics Concern  . Not on file   Social History Narrative   The PMH, PSH, Social History, Family History, Medications, and allergies have been reviewed in [facility], and have been updated if relevant.   Review of Systems  Psychiatric/Behavioral: Positive for behavioral problems. Negative for suicidal ideas, sleep disturbance, self-injury, dysphoric mood, decreased concentration and agitation. The patient is nervous/anxious.   All other systems reviewed and are negative.        Objective:    BP 120/82 mmHg  Pulse 91  Temp(Src) 98.1 F (36.7 C) (Oral)  Wt 217 lb 8 oz (98.657 kg)  SpO2 94%   Physical Exam  Constitutional: He is oriented to person, place, and time. He appears well-developed and well-nourished. No distress.  HENT:  Head: Normocephalic.  Eyes: Conjunctivae are normal.  Cardiovascular: Normal rate.   Pulmonary/Chest: Effort normal.  Musculoskeletal: Normal range of motion.  Neurological: He is alert and oriented to person, place, and time. No cranial nerve deficit.  Skin: Skin is warm and dry.  Psychiatric: He has a normal mood and affect. His behavior is normal. Judgment and thought content normal.  Nursing note and vitals reviewed.         Assessment & Plan:   No diagnosis found. No Follow-up on file.

## 2014-11-22 NOTE — Assessment & Plan Note (Signed)
Deteriorated. >25 minutes spent in face to face time with patient, >50% spent in counselling or coordination of care Discussed tx options- he would like to defer trial of another SSRI now- had previous adverse reaction.  Does agree to referral for psychotherapy.  Referral placed.  Increase klonipin - short term only-  The patient indicates understanding of these issues and agrees with the plan.

## 2014-11-22 NOTE — Patient Instructions (Signed)
Good to see you. We will call you with your a1c results.

## 2014-11-22 NOTE — Progress Notes (Signed)
Pre visit review using our clinic review tool, if applicable. No additional management support is needed unless otherwise documented below in the visit note. 

## 2014-12-11 ENCOUNTER — Ambulatory Visit (INDEPENDENT_AMBULATORY_CARE_PROVIDER_SITE_OTHER): Payer: 59 | Admitting: Psychology

## 2014-12-11 DIAGNOSIS — F4323 Adjustment disorder with mixed anxiety and depressed mood: Secondary | ICD-10-CM

## 2014-12-18 ENCOUNTER — Other Ambulatory Visit: Payer: Self-pay | Admitting: Family Medicine

## 2014-12-18 MED ORDER — ZOLPIDEM TARTRATE ER 12.5 MG PO TBCR: 12.5000 mg | EXTENDED_RELEASE_TABLET | Freq: Every evening | ORAL | Status: DC | PRN

## 2014-12-18 NOTE — Telephone Encounter (Signed)
Rx called in to requested pharmacy 

## 2014-12-18 NOTE — Telephone Encounter (Signed)
Last f/u 11/2014 

## 2015-01-07 ENCOUNTER — Other Ambulatory Visit: Payer: Self-pay | Admitting: Family Medicine

## 2015-01-07 MED ORDER — CLONAZEPAM 0.25 MG PO TBDP: ORAL_TABLET | ORAL | Status: DC

## 2015-01-07 NOTE — Telephone Encounter (Signed)
Last f/u 11/2014 

## 2015-01-07 NOTE — Telephone Encounter (Signed)
Rx called in to requested pharmacy 

## 2015-01-14 ENCOUNTER — Other Ambulatory Visit: Payer: Self-pay | Admitting: Family Medicine

## 2015-01-14 MED ORDER — ZOLPIDEM TARTRATE ER 12.5 MG PO TBCR: 12.5000 mg | EXTENDED_RELEASE_TABLET | Freq: Every evening | ORAL | Status: DC | PRN

## 2015-01-14 NOTE — Telephone Encounter (Signed)
Last f/u 11/2014 

## 2015-01-14 NOTE — Telephone Encounter (Signed)
Rx called in to requested pharmacy 

## 2015-01-17 ENCOUNTER — Other Ambulatory Visit: Payer: Self-pay | Admitting: Family Medicine

## 2015-02-07 ENCOUNTER — Other Ambulatory Visit: Payer: Self-pay | Admitting: Family Medicine

## 2015-02-07 MED ORDER — CLONAZEPAM 0.25 MG PO TBDP: ORAL_TABLET | ORAL | Status: DC

## 2015-02-07 NOTE — Telephone Encounter (Signed)
Last f/u 11/2014 

## 2015-02-08 ENCOUNTER — Encounter: Payer: Self-pay | Admitting: Family Medicine

## 2015-02-08 MED FILL — clonazePAM 0.25 MG TBDP: 0.25 | 30 days supply | Qty: 60 | Fill #0

## 2015-02-08 NOTE — Telephone Encounter (Signed)
Rx called in to requested pharmacy 

## 2015-02-13 ENCOUNTER — Encounter: Payer: Self-pay | Admitting: Family Medicine

## 2015-02-13 ENCOUNTER — Other Ambulatory Visit: Payer: Self-pay | Admitting: Family Medicine

## 2015-02-13 MED ORDER — ZOLPIDEM TARTRATE ER 12.5 MG PO TBCR: 12.5000 mg | EXTENDED_RELEASE_TABLET | Freq: Every evening | ORAL | Status: DC | PRN

## 2015-02-13 NOTE — Telephone Encounter (Signed)
Rx called in to requested pharmacy 

## 2015-02-13 NOTE — Telephone Encounter (Signed)
Last f/u 11/2014 

## 2015-02-14 MED FILL — ZOLPIDEM TART ER 12.5 MG TA: 12.5 | 30 days supply | Qty: 30 | Fill #0

## 2015-03-11 ENCOUNTER — Encounter: Payer: Self-pay | Admitting: Family Medicine

## 2015-03-11 MED ORDER — CLONAZEPAM 0.25 MG PO TBDP: ORAL_TABLET | ORAL | Status: DC

## 2015-03-11 MED FILL — clonazePAM 0.25 MG TBDP: 0.25 | 30 days supply | Qty: 60 | Fill #0

## 2015-03-11 NOTE — Telephone Encounter (Signed)
Last f/u 11/2014 

## 2015-03-11 NOTE — Telephone Encounter (Signed)
Rx called in to requested pharmacy 

## 2015-03-15 ENCOUNTER — Other Ambulatory Visit: Payer: Self-pay | Admitting: Family Medicine

## 2015-03-15 MED ORDER — ZOLPIDEM TARTRATE ER 12.5 MG PO TBCR: 12.5000 mg | EXTENDED_RELEASE_TABLET | Freq: Every evening | ORAL | Status: DC | PRN

## 2015-03-15 MED FILL — ZOLPIDEM TART ER 12.5 MG TA: 12.5 | 30 days supply | Qty: 30 | Fill #0

## 2015-03-15 NOTE — Telephone Encounter (Signed)
Rx called in to requested pharmacy 

## 2015-03-15 NOTE — Telephone Encounter (Signed)
Last f/u 11/2014 

## 2015-04-08 ENCOUNTER — Encounter: Payer: Self-pay | Admitting: Family Medicine

## 2015-04-08 NOTE — Telephone Encounter (Signed)
Last f/u 11/2014 

## 2015-04-09 ENCOUNTER — Encounter: Payer: Self-pay | Admitting: Family Medicine

## 2015-04-09 ENCOUNTER — Ambulatory Visit (INDEPENDENT_AMBULATORY_CARE_PROVIDER_SITE_OTHER): Payer: 59 | Admitting: Family Medicine

## 2015-04-09 VITALS — BP 124/64 | HR 92 | Temp 98.7°F | Wt 218.5 lb

## 2015-04-09 DIAGNOSIS — J069 Acute upper respiratory infection, unspecified: Secondary | ICD-10-CM

## 2015-04-09 MED ORDER — ZOLPIDEM TARTRATE ER 12.5 MG PO TBCR: 12.5000 mg | EXTENDED_RELEASE_TABLET | Freq: Every evening | ORAL | Status: DC | PRN

## 2015-04-09 MED ORDER — CLONAZEPAM 0.25 MG PO TBDP: ORAL_TABLET | ORAL | Status: DC

## 2015-04-09 MED ORDER — HYDROCOD POLST-CPM POLST ER 10-8 MG/5ML PO SUER: 5.0000 mL | Freq: Two times a day (BID) | ORAL | Status: DC | PRN

## 2015-04-09 MED FILL — HYDROCODONE-CHLORPHENIRAM S: 10-8 | 14 days supply | Qty: 140 | Fill #0

## 2015-04-09 MED FILL — clonazePAM 0.25 MG TBDP: 0.25 | 30 days supply | Qty: 60 | Fill #0

## 2015-04-09 NOTE — Patient Instructions (Signed)
It sounds like an uncomplicated cold that we can treat at home.    Colds are very common and may make you feel uncomfortable.   Colds are caused by viruses, and no medicine or 'shot'will cure an uncomplicated cold.  FOR A STUFFY NOSE - USE NASAL WASHES:   Saline (salt water) nasal irrigation (nasal wash) is an effective and simple home remedy for treating stuffy nose and sinus congestion. The nose can be irrigated by pouring, spraying, or squirting salt water into the nose and then letting it run back out. * How it Helps: The salt water rinses out excess mucus, washes out any irritants (dust, allergens) that might be present, and moistens the nasal cavity. * Methods: There are several ways to perform nasal irrigation. You can use a saline nasal spray bottle (available over-the-counter), a rubber ear syringe, a medical syringe without the needle, or a NETI POT. STEP-BY-STEP INSTRUCTIONS:   STEP 1: Lean over a sink. STEP 2: Gently squirt or spray warm salt water into one of your nostrils.  STEP 3: Some of the water may run into the back of your throat. Spit this out. If you swallow the salt water it will not hurt you. STEP 4: Blow your nose to clean out the water and mucus.  STEP 5: Repeat steps 1-4 for the other nostril. You can do this a couple times a day if it seems to help you.   HOW TO MAKE SALINE (SALT WATER) NASAL WASH:   You can make your own saline nasal wash.  * Add 1/2 tsp of table salt to 1 cup (8 oz; 240 ml) of warm water. * You should use sterile, distilled, or previously boiled water for nasal irrigation.   FOR A RUNNY NOSE - BLOW YOUR NOSE:   If the skin around your nostrils gets irritated, apply a tiny amount of petroleum ointment to the nasal openings once or twice a day. MEDICINES FOR STUFFY OR RUNNY NOSE: If you have a very runny nose and you really think you need a medicine, you can try using a nasal decongestant for a couple days.   TREATMENT FOR ASSOCIATED SYMPTOMS  OF COLDS:   * Sore throat: throat lozenges, hard candy or warm chicken broth. * For muscle aches, headaches, or moderate fever (over 101 degrees F) (38.9 C) use acetaminophen every 4 hours.   Cough: use cough drops.   Hydrate: drink extra liquids. HUMIDIFIER: If the air in your home is dry, use a humidifier.   CONTAGIOUSNESS: * The cold virus is present in your nasal secretions. * Cover your nose and mouth with a tissue when you sneeze or cough. * Wash your hands frequently with soap and water. * You can return to work or school after the fever is gone and you feel well enough to participate in normal activities.  EXPECTED COURSE: * Fever 2-3 days * Nasal discharge 7-14 days * Cough 2-3 weeks. CALL BACK IF: * Fever lasts over 3 days * Runny nose lasts over 10 days * You become short of breath * You become worse  

## 2015-04-09 NOTE — Progress Notes (Signed)
Pre visit review using our clinic review tool, if applicable. No additional management support is needed unless otherwise documented below in the visit note. 

## 2015-04-09 NOTE — Progress Notes (Signed)
SUBJECTIVE:  [name] is a 44 y.o. male who complains of coryza, congestion, sneezing, sore throat and chills for 3 days. He denies a history of anorexia, chest pain and chills and denies a history of asthma. Patient denies smoke cigarettes.   Current Outpatient Prescriptions on File Prior to Visit  Medication Sig Dispense Refill  . clonazePAM (KLONOPIN) 0.25 MG disintegrating tablet DISSOLVE ONE TABLET BY MOUTH twice daily as needed for anxiety 60 tablet 0  . pantoprazole (PROTONIX) 40 MG tablet TAKE 1 TABLET BY MOUTH TWICE A DAY 180 tablet 1  . zolpidem (AMBIEN CR) 12.5 MG CR tablet Take 1 tablet (12.5 mg total) by mouth at bedtime as needed for sleep. 30 tablet 0   No current facility-administered medications on file prior to visit.    Allergies  Allergen Reactions  . Dilaudid [Hydromorphone Hcl] Rash and Other (See Comments)    Shakey  . Metoclopramide Hcl Hives, Swelling and Rash    Swelling in lips    Past Medical History  Diagnosis Date  . Upper GI bleed   . Ventral hernia   . Hiatal hernia   . History of blood clots 2012    in abdomen  . Dizziness     occasionally  . Joint pain     knees  . GERD (gastroesophageal reflux disease)     takes Protonix bid  . Gastric ulcer     history of  . Constipation     related to pain meds  . Nocturia     depends on amount of fluid he drinks  . History of blood transfusion 2012  . Anxiety     takes Xanax daily prn  . External hemorrhoid, bleeding   . Diabetes mellitus without complication ([place])   . Primary localized osteoarthritis of left hip 04/24/2014    Past Surgical History  Procedure Laterality Date  . Abdominal surgery  10/2010    d/t bleeding ulcers; "oversewing of gastric ulcer"  . Esophagogastroduodenoscopy    . Hernia repair  09/04/11    incisional   . Incisional hernia repair  09/04/2011    Procedure: HERNIA REPAIR INCISIONAL;  Surgeon: Madilyn Hook, DO;  Location: [place];  Service: General;  Laterality: N/A;   debridment calcified mass  . Total hip arthroplasty Left 04/24/2014    Procedure: LEFT TOTAL HIP ARTHROPLASTY;  Surgeon: Marchia Bond, MD;  Location: [place];  Service: Orthopedics;  Laterality: Left;    Family History  Problem Relation Age of Onset  . Diabetes Mother   . Diabetes Father   . Heart disease Father     Social History   Social History  . Marital Status: Single    Spouse Name: N/A  . Number of Children: N/A  . 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 Smoking status: Never Smoker   . Smokeless tobacco: Current User    Types: Chew     Comment: occ-Chew  . Alcohol Use: 0.0 oz/week    0 Standard drinks or equivalent per week     Comment: 09/04/11 "very seldom"  . Drug Use: No  . Sexual Activity: Yes   Other Topics Concern  . Not on file   Social History Narrative   The PMH, PSH, Social History, Family History, Medications, and allergies have been reviewed in [facility], and have been updated if relevant.  OBJECTIVE: BP 124/64 mmHg  Pulse 92  Temp(Src) 98.7 F (37.1 C) (Oral)  Wt 218 lb 8 oz (99.111 kg)  SpO2 97%  He appears well, vital signs are as noted. Ears normal.  Throat and pharynx normal.  Neck supple. No adenopathy in the neck. Nose is congested. Sinuses non tender. The chest is clear, without wheezes or rales.  ASSESSMENT:  viral upper respiratory illness  PLAN: Symptomatic therapy suggested: push fluids, rest and return office visit prn if symptoms persist or worsen. Lack of antibiotic effectiveness discussed with him. Call or return to clinic prn if these symptoms worsen or fail to improve as anticipated.

## 2015-04-12 MED FILL — ZOLPIDEM TART ER 12.5 MG TA: 12.5 | 30 days supply | Qty: 30 | Fill #0

## 2015-04-18 MED FILL — PANTOPRAZOLE SOD DR 40 MG T: 40 | 90 days supply | Qty: 180 | Fill #1

## 2015-05-09 ENCOUNTER — Encounter: Payer: Self-pay | Admitting: Family Medicine

## 2015-05-09 MED ORDER — CLONAZEPAM 0.25 MG PO TBDP: ORAL_TABLET | ORAL | Status: DC

## 2015-05-09 MED FILL — clonazePAM 0.25 MG TBDP: 0.25 | 30 days supply | Qty: 60 | Fill #0

## 2015-05-09 NOTE — Telephone Encounter (Signed)
Rx called in to requested pharmacy 

## 2015-05-09 NOTE — Telephone Encounter (Signed)
Last f/u 11/2014 

## 2015-05-14 ENCOUNTER — Encounter: Payer: Self-pay | Admitting: Family Medicine

## 2015-05-14 MED ORDER — ZOLPIDEM TARTRATE ER 12.5 MG PO TBCR: 12.5000 mg | EXTENDED_RELEASE_TABLET | Freq: Every evening | ORAL | Status: DC | PRN

## 2015-05-14 MED FILL — ZOLPIDEM TART ER 12.5 MG TA: 12.5 | 30 days supply | Qty: 30 | Fill #0

## 2015-05-14 NOTE — Telephone Encounter (Signed)
Rx called in to requested pharmacy 

## 2015-05-14 NOTE — Telephone Encounter (Signed)
Last f/u 11/2014 

## 2015-05-17 ENCOUNTER — Other Ambulatory Visit: Payer: Self-pay | Admitting: Family Medicine

## 2015-06-10 ENCOUNTER — Other Ambulatory Visit: Payer: Self-pay | Admitting: Family Medicine

## 2015-06-10 MED ORDER — CLONAZEPAM 0.25 MG PO TBDP: ORAL_TABLET | ORAL | Status: DC

## 2015-06-10 MED FILL — clonazePAM 0.25 MG TBDP: 0.25 | 30 days supply | Qty: 60 | Fill #0

## 2015-06-10 NOTE — Telephone Encounter (Signed)
Rx called in to requested pharmacy 

## 2015-06-10 NOTE — Telephone Encounter (Signed)
Last f/u 11/2014

## 2015-06-11 ENCOUNTER — Encounter: Payer: Self-pay | Admitting: Family Medicine

## 2015-06-14 ENCOUNTER — Other Ambulatory Visit: Payer: Self-pay | Admitting: Family Medicine

## 2015-06-14 ENCOUNTER — Encounter: Payer: Self-pay | Admitting: Family Medicine

## 2015-06-14 NOTE — Telephone Encounter (Signed)
Last f/u 11/2014

## 2015-06-17 ENCOUNTER — Encounter: Payer: Self-pay | Admitting: Family Medicine

## 2015-06-17 MED ORDER — ZOLPIDEM TARTRATE ER 12.5 MG PO TBCR: 12.5000 mg | EXTENDED_RELEASE_TABLET | Freq: Every evening | ORAL | Status: DC | PRN

## 2015-06-17 MED FILL — ZOLPIDEM TART ER 12.5 MG TA: 12.5 | 30 days supply | Qty: 30 | Fill #0

## 2015-06-17 NOTE — Telephone Encounter (Signed)
Rx called in to requested pharmacy 

## 2015-06-20 ENCOUNTER — Telehealth: Payer: Self-pay | Admitting: Family Medicine

## 2015-06-20 ENCOUNTER — Ambulatory Visit (INDEPENDENT_AMBULATORY_CARE_PROVIDER_SITE_OTHER): Payer: 59 | Admitting: Family Medicine

## 2015-06-20 ENCOUNTER — Encounter: Payer: Self-pay | Admitting: Family Medicine

## 2015-06-20 VITALS — BP 128/78 | HR 84 | Temp 97.5°F | Wt 215.5 lb

## 2015-06-20 DIAGNOSIS — Z8719 Personal history of other diseases of the digestive system: Secondary | ICD-10-CM | POA: Diagnosis not present

## 2015-06-20 DIAGNOSIS — K921 Melena: Secondary | ICD-10-CM | POA: Diagnosis not present

## 2015-06-20 DIAGNOSIS — R41840 Attention and concentration deficit: Secondary | ICD-10-CM | POA: Insufficient documentation

## 2015-06-20 DIAGNOSIS — K253 Acute gastric ulcer without hemorrhage or perforation: Secondary | ICD-10-CM

## 2015-06-20 NOTE — Progress Notes (Signed)
Subjective:   Patient ID: [name], male    DOB: Apr 30, 1970, 45 y.o.   MRN: [id number]  [name] is a pleasant 45 y.o. year old male who presents to clinic today with Follow-up; Hematemesis; and Blood In Stools  on 06/20/2015  HPI:  Difficulty concentrating- worsening over past several months.  Now works at [employer] in the evenings and his boss says he has difficulty completing tasks.  He has noticed this too.  It is also worsening his anxiety. Denies SI or HI. Sleeping ok.  Does not recall if he had these symptoms as a child.  Blood in stool- intermittent issue for years.  Not currently having symptoms.  Usually painful and when he is constipated.  Did have presumed gastroenteritis two weeks ago and had some blood in his emesis.  This concerned him because he had a history of GI bleed a few years ago.  Symptoms have since resolved. No black or coffee ground emesis or stool. No abdominal pain. He is taking his protonix daily.  Current Outpatient Prescriptions on File Prior to Visit  Medication Sig Dispense Refill  . clonazePAM (KLONOPIN) 0.25 MG disintegrating tablet DISSOLVE ONE TABLET BY MOUTH twice daily as needed for anxiety 60 tablet 0  . pantoprazole (PROTONIX) 40 MG tablet TAKE 1 TABLET BY MOUTH TWICE A DAY 180 tablet 1  . zolpidem (AMBIEN CR) 12.5 MG CR tablet Take 1 tablet (12.5 mg total) by mouth at bedtime as needed for sleep. 30 tablet 0   No current facility-administered medications on file prior to visit.    Allergies  Allergen Reactions  . Dilaudid [Hydromorphone Hcl] Rash and Other (See Comments)    Shakey  . Metoclopramide Hcl Hives, Swelling and Rash    Swelling in lips    Past Medical History  Diagnosis Date  . Upper GI bleed   . Ventral hernia   . Hiatal hernia   . History of blood clots 2012    in abdomen  . Dizziness     occasionally  . Joint pain     knees  . GERD (gastroesophageal reflux disease)     takes Protonix bid  .  Gastric ulcer     history of  . Constipation     related to pain meds  . Nocturia     depends on amount of fluid he drinks  . History of blood transfusion 2012  . Anxiety     takes Xanax daily prn  . External hemorrhoid, bleeding   . Diabetes mellitus without complication ([place])   . Primary localized osteoarthritis of left hip 04/24/2014    Past Surgical History  Procedure Laterality Date  . Abdominal surgery  10/2010    d/t bleeding ulcers; "oversewing of gastric ulcer"  . Esophagogastroduodenoscopy    . Hernia repair  09/04/11    incisional   . Incisional hernia repair  09/04/2011    Procedure: HERNIA REPAIR INCISIONAL;  Surgeon: Madilyn Hook, DO;  Location: [place];  Service: General;  Laterality: N/A;  debridment calcified mass  . Total hip arthroplasty Left 04/24/2014    Procedure: LEFT TOTAL HIP ARTHROPLASTY;  Surgeon: Marchia Bond, MD;  Location: [place];  Service: Orthopedics;  Laterality: Left;    Family History  Problem Relation Age of Onset  . Diabetes Mother   . Diabetes Father   . Heart disease Father     Social History   Social History  . Marital Status: Single  Spouse Name: N/A  . Number of Children: N/A  . 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 Smoking status: Never Smoker   . Smokeless tobacco: Current User    Types: Chew     Comment: occ-Chew  . Alcohol Use: 0.0 oz/week    0 Standard drinks or equivalent per week     Comment: 09/04/11 "very seldom"  . Drug Use: No  . Sexual Activity: Yes   Other Topics Concern  . Not on file   Social History Narrative   The PMH, PSH, Social History, Family History, Medications, and allergies have been reviewed in [facility], and have been updated if relevant.. Review of Systems  Constitutional: Negative.   Cardiovascular: Negative.   Gastrointestinal: Positive for vomiting, constipation and blood in stool. Negative for nausea and diarrhea.    Genitourinary: Negative.   Musculoskeletal: Negative.   Allergic/Immunologic: Negative.   Neurological: Negative.   Psychiatric/Behavioral: Positive for decreased concentration. Negative for suicidal ideas, sleep disturbance and self-injury. The patient is nervous/anxious.   All other systems reviewed and are negative.      Objective:    BP 128/78 mmHg  Pulse 84  Temp(Src) 97.5 F (36.4 C) (Oral)  Wt 215 lb 8 oz (97.75 kg)  SpO2 98%   Physical Exam  Constitutional: He is oriented to person, place, and time. He appears well-developed and well-nourished. No distress.  HENT:  Head: Normocephalic.  Eyes: Conjunctivae are normal.  Cardiovascular: Normal rate.   Pulmonary/Chest: Effort normal.  Musculoskeletal: Normal range of motion.  Neurological: He is alert and oriented to person, place, and time. No cranial nerve deficit.  Skin: Skin is warm and dry. He is not diaphoretic.  Psychiatric: He has a normal mood and affect. His behavior is normal. Judgment and thought content normal.  Nursing note and vitals reviewed.         Assessment & Plan:   H/O: upper GI bleed - Plan: Ambulatory referral to Gastroenterology  Acute gastric ulcer - Plan: Ambulatory referral to Gastroenterology  Blood in stool - Plan: Ambulatory referral to Gastroenterology  Difficulty concentrating - Plan: Ambulatory referral to Psychology No Follow-up on file.

## 2015-06-20 NOTE — Assessment & Plan Note (Signed)
Probable fissure. Symptoms have resolved. Advised increasing fiber, water, take stool softeners as needed.

## 2015-06-20 NOTE — Progress Notes (Signed)
Pre visit review using our clinic review tool, if applicable. No additional management support is needed unless otherwise documented below in the visit note. 

## 2015-06-20 NOTE — Telephone Encounter (Signed)
LVM for pt letting him know that [facility] will be calling him to schedule with Dr. Lurline Hare.

## 2015-06-20 NOTE — Assessment & Plan Note (Signed)
With recent emesis of hematemesis, likely consistent with gastric irritation from retching. Refer back to GI. ? If he needs another endoscopy. Continue PPI. The patient indicates understanding of these issues and agrees with the plan.

## 2015-06-20 NOTE — Patient Instructions (Signed)
Great to see you. We will call you with your referrals.

## 2015-06-20 NOTE — Assessment & Plan Note (Signed)
Deteriorated. Refer for psych for evaluation- re: rule out ADHD. The patient indicates understanding of these issues and agrees with the plan.

## 2015-07-08 ENCOUNTER — Other Ambulatory Visit: Payer: Self-pay | Admitting: Family Medicine

## 2015-07-08 MED ORDER — CLONAZEPAM 0.25 MG PO TBDP: ORAL_TABLET | ORAL | Status: DC

## 2015-07-08 MED FILL — clonazePAM 0.25 MG TBDP: 0.25 | 30 days supply | Qty: 60 | Fill #0

## 2015-07-08 NOTE — Telephone Encounter (Signed)
Last f/u 11/2014

## 2015-07-08 NOTE — Telephone Encounter (Signed)
Rx called in to requested pharmacy 

## 2015-07-15 ENCOUNTER — Encounter: Payer: Self-pay | Admitting: Family Medicine

## 2015-07-15 ENCOUNTER — Other Ambulatory Visit: Payer: Self-pay | Admitting: Family Medicine

## 2015-07-15 MED ORDER — ZOLPIDEM TARTRATE ER 12.5 MG PO TBCR: 12.5000 mg | EXTENDED_RELEASE_TABLET | Freq: Every evening | ORAL | Status: DC | PRN

## 2015-07-15 MED FILL — ZOLPIDEM TART ER 12.5 MG TA: 12.5 | 30 days supply | Qty: 30 | Fill #0

## 2015-07-15 NOTE — Telephone Encounter (Signed)
Last office visit 06/20/2015.  Last refilled 06/17/2015 for #30 with no refills.  Ok to refill?

## 2015-07-15 NOTE — Telephone Encounter (Signed)
Zolpidem called into [facility]. 

## 2015-07-18 ENCOUNTER — Encounter: Payer: Self-pay | Admitting: Family Medicine

## 2015-07-18 MED ORDER — PANTOPRAZOLE SODIUM 40 MG PO TBEC: 40.0000 mg | DELAYED_RELEASE_TABLET | Freq: Two times a day (BID) | ORAL | Status: DC

## 2015-07-18 MED FILL — PANTOPRAZOLE SOD DR 40 MG T: 40 | 90 days supply | Qty: 180 | Fill #0

## 2015-07-22 ENCOUNTER — Telehealth: Payer: Self-pay | Admitting: Gastroenterology

## 2015-07-22 ENCOUNTER — Encounter: Payer: Self-pay | Admitting: Family Medicine

## 2015-07-22 ENCOUNTER — Ambulatory Visit (INDEPENDENT_AMBULATORY_CARE_PROVIDER_SITE_OTHER): Payer: 59 | Admitting: Family Medicine

## 2015-07-22 VITALS — BP 116/72 | HR 94 | Temp 98.2°F | Wt 212.5 lb

## 2015-07-22 DIAGNOSIS — R7309 Other abnormal glucose: Secondary | ICD-10-CM

## 2015-07-22 DIAGNOSIS — R05 Cough: Secondary | ICD-10-CM | POA: Diagnosis not present

## 2015-07-22 DIAGNOSIS — R059 Cough, unspecified: Secondary | ICD-10-CM | POA: Insufficient documentation

## 2015-07-22 LAB — HEMOGLOBIN A1C: Hgb A1c MFr Bld: 5.9 % (ref 4.6–6.5)

## 2015-07-22 NOTE — Progress Notes (Signed)
Subjective:   Patient ID: [name], male    DOB: 19-Dec-1970, 45 y.o.   MRN: [id number]  [name] is a pleasant 45 y.o. year old male who presents to clinic today with Follow-up and Cough  on 07/22/2015  HPI:  Follow up a1c- history of borderline a1c in past- Lab Results  Component Value Date   HGBA1C 6.0 11/22/2014   +family history of DM Has noticed increased thirst or urination over past month.  Has noticed decreased appetite- has lost 3 pounds in 1 month.  Cough- past 3 weeks, persistent cough.  Cough is worse at night. No fevers or chills. Taking OTC allergy pill- not sure which one.  Not sure if it's helping. No CP or SOB.   Current Outpatient Prescriptions on File Prior to Visit  Medication Sig Dispense Refill  . clonazePAM (KLONOPIN) 0.25 MG disintegrating tablet DISSOLVE ONE TABLET BY MOUTH twice daily as needed for anxiety 60 tablet 0  . pantoprazole (PROTONIX) 40 MG tablet Take 1 tablet (40 mg total) by mouth 2 (two) times daily. 180 tablet 1  . zolpidem (AMBIEN CR) 12.5 MG CR tablet Take 1 tablet (12.5 mg total) by mouth at bedtime as needed for sleep. 30 tablet 0   No current facility-administered medications on file prior to visit.    Allergies  Allergen Reactions  . Dilaudid [Hydromorphone Hcl] Rash and Other (See Comments)    Shakey  . Metoclopramide Hcl Hives, Swelling and Rash    Swelling in lips    Past Medical History  Diagnosis Date  . Upper GI bleed   . Ventral hernia   . Hiatal hernia   . History of blood clots 2012    in abdomen  . Dizziness     occasionally  . Joint pain     knees  . GERD (gastroesophageal reflux disease)     takes Protonix bid  . Gastric ulcer     history of  . Constipation     related to pain meds  . Nocturia     depends on amount of fluid he drinks  . History of blood transfusion 2012  . Anxiety     takes Xanax daily prn  . External hemorrhoid, bleeding   . Diabetes mellitus without complication ([place])    . Primary localized osteoarthritis of left hip 04/24/2014    Past Surgical History  Procedure Laterality Date  . Abdominal surgery  10/2010    d/t bleeding ulcers; "oversewing of gastric ulcer"  . Esophagogastroduodenoscopy    . Hernia repair  09/04/11    incisional   . Incisional hernia repair  09/04/2011    Procedure: HERNIA REPAIR INCISIONAL;  Surgeon: Madilyn Hook, DO;  Location: [place];  Service: General;  Laterality: N/A;  debridment calcified mass  . Total hip arthroplasty Left 04/24/2014    Procedure: LEFT TOTAL HIP ARTHROPLASTY;  Surgeon: Marchia Bond, MD;  Location: [place];  Service: Orthopedics;  Laterality: Left;    Family History  Problem Relation Age of Onset  . Diabetes Mother   . Diabetes Father   . Heart disease Father     Social History   Social History  . Marital Status: Single    Spouse Name: N/A  . Number of Children: N/A  . 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 Smoking status: Never Smoker   . Smokeless tobacco: Current User  Types: Chew     Comment: occ-Chew  . Alcohol Use: 0.0 oz/week    0 Standard drinks or equivalent per week     Comment: 09/04/11 "very seldom"  . Drug Use: No  . Sexual Activity: Yes   Other Topics Concern  . Not on file   Social History Narrative   The PMH, PSH, Social History, Family History, Medications, and allergies have been reviewed in [facility], and have been updated if relevant.   Review of Systems  Constitutional: Positive for fatigue and unexpected weight change.  HENT: Negative.   Eyes: Negative.   Respiratory: Positive for cough. Negative for shortness of breath and stridor.   Endocrine: Positive for polydipsia and polyuria. Negative for polyphagia.  Genitourinary: Negative.   Musculoskeletal: Negative.        Objective:    BP 116/72 mmHg  Pulse 94  Temp(Src) 98.2 F (36.8 C) (Oral)  Wt 212 lb 8 oz (96.389 kg)  SpO2 97%  Wt Readings  from Last 3 Encounters:  07/22/15 212 lb 8 oz (96.389 kg)  06/20/15 215 lb 8 oz (97.75 kg)  04/09/15 218 lb 8 oz (99.111 kg)    Physical Exam  Constitutional: He is oriented to person, place, and time. He appears well-developed and well-nourished. No distress.  HENT:  Head: Normocephalic.  +PND  Eyes: Conjunctivae are normal.  Cardiovascular: Normal rate and regular rhythm.   Pulmonary/Chest: Effort normal and breath sounds normal. No respiratory distress.  Musculoskeletal: Normal range of motion.  Neurological: He is alert and oriented to person, place, and time. No cranial nerve deficit.  Skin: Skin is warm and dry.  Psychiatric: He has a normal mood and affect. His behavior is normal. Judgment and thought content normal.  Nursing note and vitals reviewed.         Assessment & Plan:   Elevated hemoglobin A1c  Cough No Follow-up on file.

## 2015-07-22 NOTE — Assessment & Plan Note (Signed)
New- lung exam reassuring. Likely due to PND. Advised OTC allegra. Call or return to clinic prn if these symptoms worsen or fail to improve as anticipated. The patient indicates understanding of these issues and agrees with the plan.

## 2015-07-22 NOTE — Progress Notes (Signed)
Pre visit review using our clinic review tool, if applicable. No additional management support is needed unless otherwise documented below in the visit note. 

## 2015-07-22 NOTE — Telephone Encounter (Signed)
Received records from [facility] and placed on Dr. Woodward Ku desk for review.

## 2015-07-22 NOTE — Patient Instructions (Signed)
Good to see you. I will call you with your lab results.  Please try OTC allegra.

## 2015-07-22 NOTE — Addendum Note (Signed)
Addended by: [name] on: 07/22/2015 10:19 AM   Modules accepted: Orders

## 2015-07-22 NOTE — Assessment & Plan Note (Signed)
Now with increased thirst and urination. Has been working outside in the heat, so may be due to increased water need. Will recheck a1c today.

## 2015-07-26 ENCOUNTER — Ambulatory Visit: Payer: 59 | Admitting: Psychology

## 2015-08-07 ENCOUNTER — Encounter: Payer: Self-pay | Admitting: Family Medicine

## 2015-08-07 MED ORDER — CLONAZEPAM 0.25 MG PO TBDP
ORAL_TABLET | ORAL | Status: DC
Start: 2015-08-07 — End: 2015-09-06

## 2015-08-07 MED FILL — clonazePAM 0.25 MG TBDP: 0.25 | 30 days supply | Qty: 60 | Fill #0

## 2015-08-07 NOTE — Telephone Encounter (Signed)
Last f/u 11/2014

## 2015-08-07 NOTE — Telephone Encounter (Signed)
Rx called in to requested pharmacy 

## 2015-08-12 ENCOUNTER — Other Ambulatory Visit: Payer: Self-pay | Admitting: Family Medicine

## 2015-08-12 MED ORDER — ZOLPIDEM TARTRATE ER 12.5 MG PO TBCR: 12.5000 mg | EXTENDED_RELEASE_TABLET | Freq: Every evening | ORAL | Status: DC | PRN

## 2015-08-12 NOTE — Telephone Encounter (Signed)
Last f/u 11/2014

## 2015-08-13 MED FILL — ZOLPIDEM TART ER 12.5 MG TA: 12.5 | 30 days supply | Qty: 30 | Fill #0

## 2015-08-13 NOTE — Telephone Encounter (Signed)
Rx called in to requested pharmacy 

## 2015-08-23 NOTE — Telephone Encounter (Signed)
LM ON VMAIL.   DR. Silverio Decamp DECLINED TO ACCEPT PATIENT AT THIS TIME  "CANNOT ACCOMMODATE"

## 2015-09-04 NOTE — Telephone Encounter (Signed)
-----   Message ----- From: Irven [place] Sent: 09/04/2015   2:44 PM To: Salvatore Marvel  Dr. Hilarie Fredrickson is Doc of the Day for this afternoon. Records will be placed on his desk for review.   ----- Message ----- From: Salvatore Marvel Sent: 09/04/2015   2:36 PM To: Ansel Bong, Is there any other provider willing to see Mr. Larsson. He wanted to switch to [place] to stay with the [place] since he is a [employer] employee?  Ebony Hail

## 2015-09-06 ENCOUNTER — Encounter: Payer: Self-pay | Admitting: Family Medicine

## 2015-09-06 ENCOUNTER — Other Ambulatory Visit: Payer: Self-pay | Admitting: Family Medicine

## 2015-09-06 MED ORDER — CLONAZEPAM 0.25 MG PO TBDP: ORAL_TABLET | ORAL | 0 refills | Status: DC

## 2015-09-06 MED FILL — clonazePAM 0.25 MG TBDP: 0.25 | 30 days supply | Qty: 60 | Fill #0

## 2015-09-06 NOTE — Telephone Encounter (Signed)
Last f/u 11/2014

## 2015-09-06 NOTE — Telephone Encounter (Signed)
Rx called in to requested pharmacy 

## 2015-09-11 ENCOUNTER — Other Ambulatory Visit: Payer: Self-pay | Admitting: Family Medicine

## 2015-09-11 ENCOUNTER — Encounter: Payer: Self-pay | Admitting: Family Medicine

## 2015-09-11 MED ORDER — ZOLPIDEM TARTRATE ER 12.5 MG PO TBCR: 12.5000 mg | EXTENDED_RELEASE_TABLET | Freq: Every evening | ORAL | 0 refills | Status: DC | PRN

## 2015-09-11 MED FILL — ZOLPIDEM TART ER 12.5 MG TA: 12.5 | 30 days supply | Qty: 30 | Fill #0

## 2015-09-11 NOTE — Telephone Encounter (Signed)
Last f/u 11/2014

## 2015-09-11 NOTE — Telephone Encounter (Signed)
Rx called in to requested pharmacy 

## 2015-09-27 ENCOUNTER — Other Ambulatory Visit: Payer: Self-pay | Admitting: Family Medicine

## 2015-09-27 NOTE — Telephone Encounter (Signed)
Last office visit 07/22/2015.  Last refilled 09/06/2015 for #60 with no refills. Ok to refill?

## 2015-09-27 NOTE — Telephone Encounter (Signed)
Clonazepam called into [facility].

## 2015-10-08 ENCOUNTER — Encounter: Payer: Self-pay | Admitting: Family Medicine

## 2015-10-08 ENCOUNTER — Other Ambulatory Visit: Payer: Self-pay | Admitting: Family Medicine

## 2015-10-08 MED FILL — clonazePAM 0.25 MG TBDP: 0.25 | 30 days supply | Qty: 60 | Fill #0

## 2015-10-11 ENCOUNTER — Other Ambulatory Visit: Payer: Self-pay | Admitting: Family Medicine

## 2015-10-11 MED ORDER — ZOLPIDEM TARTRATE ER 12.5 MG PO TBCR: 12.5000 mg | EXTENDED_RELEASE_TABLET | Freq: Every evening | ORAL | 0 refills | Status: DC | PRN

## 2015-10-11 NOTE — Telephone Encounter (Signed)
Ok to phone in Ambien 

## 2015-10-11 NOTE — Telephone Encounter (Signed)
#   30 last filled 09/11/15. Last ov was a f/u with Dr. Deborra Medina on 07/20/15.

## 2015-10-11 NOTE — Telephone Encounter (Signed)
PHONED IN TO [place], [place]: [phone]

## 2015-10-14 ENCOUNTER — Encounter: Payer: Self-pay | Admitting: Family Medicine

## 2015-10-14 ENCOUNTER — Encounter: Payer: Self-pay | Admitting: Internal Medicine

## 2015-10-14 MED FILL — ZOLPIDEM TART ER 12.5 MG TA: 12.5 | 30 days supply | Qty: 30 | Fill #0

## 2015-10-14 NOTE — Telephone Encounter (Signed)
Dr. Hilarie Fredrickson reviewed records and has accepted patient. Ok to schedule OV.

## 2015-10-15 MED FILL — PANTOPRAZOLE SOD DR 40 MG T: 40 | 90 days supply | Qty: 180 | Fill #1

## 2015-11-05 ENCOUNTER — Other Ambulatory Visit: Payer: Self-pay | Admitting: Family Medicine

## 2015-11-05 MED ORDER — CLONAZEPAM 0.25 MG PO TBDP: ORAL_TABLET | ORAL | 0 refills | Status: DC

## 2015-11-05 NOTE — Telephone Encounter (Signed)
Rx called in to requested pharmacy 

## 2015-11-05 NOTE — Telephone Encounter (Signed)
Last f/u 11/2014

## 2015-11-06 MED FILL — clonazePAM 0.25 MG TBDP: 0.25 | 30 days supply | Qty: 60 | Fill #0

## 2015-11-11 ENCOUNTER — Other Ambulatory Visit: Payer: Self-pay | Admitting: Internal Medicine

## 2015-11-11 ENCOUNTER — Encounter: Payer: Self-pay | Admitting: Family Medicine

## 2015-11-11 MED ORDER — ZOLPIDEM TARTRATE ER 12.5 MG PO TBCR: 12.5000 mg | EXTENDED_RELEASE_TABLET | Freq: Every evening | ORAL | 0 refills | Status: DC | PRN

## 2015-11-11 NOTE — Telephone Encounter (Signed)
Rx called in to requested pharmacy 

## 2015-11-11 NOTE — Telephone Encounter (Signed)
Last f/u 11/2014

## 2015-11-12 MED FILL — ZOLPIDEM TART ER 12.5 MG TA: 12.5 | 30 days supply | Qty: 30 | Fill #0

## 2015-11-28 ENCOUNTER — Ambulatory Visit (INDEPENDENT_AMBULATORY_CARE_PROVIDER_SITE_OTHER): Payer: 59 | Admitting: Family Medicine

## 2015-11-28 ENCOUNTER — Encounter: Payer: Self-pay | Admitting: Family Medicine

## 2015-11-28 VITALS — BP 146/94 | HR 83 | Temp 97.9°F | Wt 216.2 lb

## 2015-11-28 DIAGNOSIS — F5101 Primary insomnia: Secondary | ICD-10-CM | POA: Diagnosis not present

## 2015-11-28 DIAGNOSIS — F419 Anxiety disorder, unspecified: Secondary | ICD-10-CM | POA: Diagnosis not present

## 2015-11-28 MED ORDER — BUSPIRONE HCL 30 MG PO TABS: 30.0000 mg | ORAL_TABLET | Freq: Two times a day (BID) | ORAL | 1 refills | Status: DC

## 2015-11-28 MED FILL — busPIRone HCL 30 MG TABS: 30 | 90 days supply | Qty: 180 | Fill #0

## 2015-11-28 NOTE — Progress Notes (Signed)
Subjective:   Patient ID: [name], male    DOB: 07-20-1970, 45 y.o.   MRN: [id number]  [name] is a pleasant 45 y.o. year old male who presents to clinic today with Follow-up  on 11/28/2015  HPI:   Anxiety- Stopped taking Buspar in 04/2015.  Currently taking Klonipin twice daily and Ambien at bed time. Feels this is working well for insomnia and anxiety. Denies feeling anxious. Working two jobs now.  Sleeping better.  Current Outpatient Prescriptions on File Prior to Visit  Medication Sig Dispense Refill  . clonazePAM (KLONOPIN) 0.25 MG disintegrating tablet DISSOLVE 1 TABLET BY MOUTH TWICE DAILY AS NEEDED FOR ANXIETY 60 tablet 0  . pantoprazole (PROTONIX) 40 MG tablet Take 1 tablet (40 mg total) by mouth 2 (two) times daily. 180 tablet 1  . zolpidem (AMBIEN CR) 12.5 MG CR tablet Take 1 tablet (12.5 mg total) by mouth at bedtime as needed for sleep. 30 tablet 0   No current facility-administered medications on file prior to visit.     Allergies  Allergen Reactions  . Dilaudid [Hydromorphone Hcl] Rash and Other (See Comments)    Shakey  . Metoclopramide Hcl Hives, Swelling and Rash    Swelling in lips    Past Medical History:  Diagnosis Date  . Anxiety    takes Xanax daily prn  . Constipation    related to pain meds  . Diabetes mellitus without complication ([employer])   . Dizziness    occasionally  . External hemorrhoid, bleeding   . Gastric ulcer    history of  . GERD (gastroesophageal reflux disease)    takes Protonix bid  . Hiatal hernia   . History of blood clots 2012   in abdomen  . History of blood transfusion 2012  . Joint pain    knees  . Nocturia    depends on amount of fluid he drinks  . Primary localized osteoarthritis of left hip 04/24/2014  . Upper GI bleed   . Ventral hernia     Past Surgical History:  Procedure Laterality Date  . ABDOMINAL SURGERY  10/2010   d/t bleeding ulcers; "oversewing of gastric ulcer"  .  ESOPHAGOGASTRODUODENOSCOPY    . HERNIA REPAIR  09/04/11   incisional   . INCISIONAL HERNIA REPAIR  09/04/2011   Procedure: HERNIA REPAIR INCISIONAL;  Surgeon: Madilyn Hook, DO;  Location: [facility];  Service: General;  Laterality: N/A;  debridment calcified mass  . TOTAL HIP ARTHROPLASTY Left 04/24/2014   Procedure: LEFT TOTAL HIP ARTHROPLASTY;  Surgeon: Marchia Bond, MD;  Location: [facility];  Service: Orthopedics;  Laterality: Left;    Family History  Problem Relation Age of Onset  . Diabetes Mother   . Diabetes Father   . Heart disease Father     Social History   Social History  . Marital status: Single    Spouse name: N/A  . Number of children: N/A  . 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 Smoking status: Never Smoker  . Smokeless tobacco: Current User    Types: Chew     Comment: occ-Chew  . Alcohol use 0.0 oz/week     Comment: 09/04/11 "very seldom"  . Drug use: No  . Sexual activity: Yes   Other Topics Concern  . Not on file   Social History Narrative  . No narrative on file   The PMH, [place],  Social History, Family History, Medications, and allergies have been reviewed in [facility], and have been updated if relevant.  Review of Systems  Psychiatric/Behavioral: Negative.   All other systems reviewed and are negative.      Objective:    BP (!) 146/94   Pulse 83   Temp 97.9 F (36.6 C) (Oral)   Wt 216 lb 4 oz (98.1 kg)   SpO2 97%   BMI 31.93 kg/m    Physical Exam  Constitutional: He is oriented to person, place, and time. He appears well-developed and well-nourished. No distress.  HENT:  Head: Normocephalic.  Eyes: Conjunctivae are normal.  Cardiovascular: Normal rate.   Pulmonary/Chest: Effort normal.  Musculoskeletal: Normal range of motion.  Neurological: He is alert and oriented to person, place, and time. No cranial nerve deficit.  Skin: Skin is warm and dry. He is not diaphoretic.    Psychiatric: He has a normal mood and affect. His behavior is normal. Judgment and thought content normal.  Nursing note and vitals reviewed.         Assessment & Plan:   Primary insomnia  Anxiety No Follow-up on file.

## 2015-11-28 NOTE — Patient Instructions (Signed)
Great to see you. We are starting Buspar 30 mg twice daily.  Continue your other medications.

## 2015-11-28 NOTE — Assessment & Plan Note (Signed)
>  15 minutes spent in face to face time with patient, >50% spent in counselling or coordination of care discussing anxiety and insomnia. Advised to restart Buspar- eRx  Sent and wean down klonipin- start taking as needed. The patient indicates understanding of these issues and agrees with the plan.

## 2015-11-28 NOTE — Progress Notes (Signed)
Pre visit review using our clinic review tool, if applicable. No additional management support is needed unless otherwise documented below in the visit note. 

## 2015-12-06 ENCOUNTER — Encounter: Payer: Self-pay | Admitting: Family Medicine

## 2015-12-06 ENCOUNTER — Other Ambulatory Visit: Payer: Self-pay | Admitting: Family Medicine

## 2015-12-06 MED ORDER — CLONAZEPAM 0.25 MG PO TBDP: ORAL_TABLET | ORAL | 0 refills | Status: DC

## 2015-12-06 MED FILL — clonazePAM 0.25 MG TBDP: 0.25 | 30 days supply | Qty: 60 | Fill #0

## 2015-12-06 NOTE — Telephone Encounter (Signed)
Rx called in to requested pharmacy 

## 2015-12-06 NOTE — Telephone Encounter (Signed)
Last f/u 11/2015

## 2015-12-11 ENCOUNTER — Ambulatory Visit: Payer: Self-pay | Admitting: Family Medicine

## 2015-12-11 DIAGNOSIS — Z0289 Encounter for other administrative examinations: Secondary | ICD-10-CM

## 2015-12-12 ENCOUNTER — Ambulatory Visit (INDEPENDENT_AMBULATORY_CARE_PROVIDER_SITE_OTHER): Payer: 59 | Admitting: Family Medicine

## 2015-12-12 ENCOUNTER — Encounter: Payer: Self-pay | Admitting: Family Medicine

## 2015-12-12 DIAGNOSIS — R030 Elevated blood-pressure reading, without diagnosis of hypertension: Secondary | ICD-10-CM

## 2015-12-12 MED ORDER — ZOLPIDEM TARTRATE ER 12.5 MG PO TBCR: 12.5000 mg | EXTENDED_RELEASE_TABLET | Freq: Every evening | ORAL | 0 refills | Status: DC | PRN

## 2015-12-12 NOTE — Progress Notes (Signed)
Subjective:   Patient ID: [name], male    DOB: [birth year], 45 y.o.   MRN: [id number]  [name] is a pleasant 45 y.o. year old male who presents to clinic today with Hypertension (elevated readings)  on 12/12/2015  HPI:  Here for ? HTN.  Has had some elevated readings at work.  Does not take antihypertensives as he does not have a diagnosis of HTN.  Denies HA, blurred vision, CP or SOB.  The BP cuff at work is electronic and does feel tight.  Current Outpatient Prescriptions on File Prior to Visit  Medication Sig Dispense Refill  . busPIRone (BUSPAR) 30 MG tablet Take 1 tablet (30 mg total) by mouth 2 (two) times daily. 180 tablet 1  . clonazePAM (KLONOPIN) 0.25 MG disintegrating tablet DISSOLVE 1 TABLET BY MOUTH TWICE DAILY AS NEEDED FOR ANXIETY 60 tablet 0  . pantoprazole (PROTONIX) 40 MG tablet Take 1 tablet (40 mg total) by mouth 2 (two) times daily. 180 tablet 1   No current facility-administered medications on file prior to visit.     Allergies  Allergen Reactions  . Dilaudid [Hydromorphone Hcl] Rash and Other (See Comments)    Shakey  . Metoclopramide Hcl Hives, Swelling and Rash    Swelling in lips    Past Medical History:  Diagnosis Date  . Anxiety    takes Xanax daily prn  . Constipation    related to pain meds  . Diabetes mellitus without complication ([place])   . Dizziness    occasionally  . External hemorrhoid, bleeding   . Gastric ulcer    history of  . GERD (gastroesophageal reflux disease)    takes Protonix bid  . Hiatal hernia   . History of blood clots 2012   in abdomen  . History of blood transfusion 2012  . Joint pain    knees  . Nocturia    depends on amount of fluid he drinks  . Primary localized osteoarthritis of left hip 04/24/2014  . Upper GI bleed   . Ventral hernia     Past Surgical History:  Procedure Laterality Date  . ABDOMINAL SURGERY  10/2010   d/t bleeding ulcers; "oversewing of gastric ulcer"  .  ESOPHAGOGASTRODUODENOSCOPY    . HERNIA REPAIR  09/04/11   incisional   . INCISIONAL HERNIA REPAIR  09/04/2011   Procedure: HERNIA REPAIR INCISIONAL;  Surgeon: Madilyn Hook, DO;  Location: [facility];  Service: General;  Laterality: N/A;  debridment calcified mass  . TOTAL HIP ARTHROPLASTY Left 04/24/2014   Procedure: LEFT TOTAL HIP ARTHROPLASTY;  Surgeon: Marchia Bond, MD;  Location: [facility];  Service: Orthopedics;  Laterality: Left;    Family History  Problem Relation Age of Onset  . Diabetes Mother   . Diabetes Father   . Heart disease Father     Social History   Social History  . Marital status: Single    Spouse name: N/A  . Number of children: N/A  . 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 Smoking status: Never Smoker  . Smokeless tobacco: Current User    Types: Chew     Comment: occ-Chew  . Alcohol use 0.0 oz/week     Comment: 09/04/11 "very seldom"  . Drug use: No  . Sexual activity: Yes   Other Topics Concern  . Not on file   Social History Narrative  . No narrative  on file   The PMH, PSH, Social History, Family History, Medications, and allergies have been reviewed in [facility], and have been updated if relevant.   Review of Systems  Constitutional: Negative.   Eyes: Negative.   Respiratory: Negative.   Cardiovascular: Negative.   Endocrine: Negative.   Genitourinary: Negative.   Musculoskeletal: Negative.   Allergic/Immunologic: Negative.   Neurological: Negative.   Hematological: Negative.   Psychiatric/Behavioral: Negative.   All other systems reviewed and are negative.      Objective:    BP 132/82   Pulse 84   Temp 97.5 F (36.4 C) (Oral)   Wt 220 lb 4 oz (99.9 kg)   SpO2 97%   BMI 32.53 kg/m   BP Readings from Last 3 Encounters:  12/12/15 132/82  11/28/15 (!) 146/94  07/22/15 116/72    Physical Exam  Constitutional: He is oriented to person, place, and time. He appears  well-developed and well-nourished. No distress.  HENT:  Head: Normocephalic and atraumatic.  Eyes: Conjunctivae are normal.  Cardiovascular: Normal rate and regular rhythm.   Pulmonary/Chest: Effort normal and breath sounds normal.  Musculoskeletal: Normal range of motion. He exhibits no edema.  Neurological: He is alert and oriented to person, place, and time. No cranial nerve deficit.  Skin: Skin is warm and dry. He is not diaphoretic.  Psychiatric: He has a normal mood and affect. His behavior is normal. Judgment and thought content normal.  Nursing note and vitals reviewed.         Assessment & Plan:   Elevated blood pressure reading No Follow-up on file.

## 2015-12-12 NOTE — Progress Notes (Signed)
Pre visit review using our clinic review tool, if applicable. No additional management support is needed unless otherwise documented below in the visit note. 

## 2015-12-12 NOTE — Assessment & Plan Note (Signed)
Reassurance provided. Normotensive here x 2. Advised to keep me updated and to not use the BP cuff at work. The patient indicates understanding of these issues and agrees with the plan.

## 2015-12-13 MED FILL — ZOLPIDEM TART ER 12.5 MG TA: 12.5 | 30 days supply | Qty: 30 | Fill #0

## 2015-12-18 ENCOUNTER — Encounter: Payer: Self-pay | Admitting: Internal Medicine

## 2015-12-18 ENCOUNTER — Ambulatory Visit (INDEPENDENT_AMBULATORY_CARE_PROVIDER_SITE_OTHER): Payer: 59 | Admitting: Internal Medicine

## 2015-12-18 ENCOUNTER — Ambulatory Visit (INDEPENDENT_AMBULATORY_CARE_PROVIDER_SITE_OTHER)
Admission: RE | Admit: 2015-12-18 | Discharge: 2015-12-18 | Disposition: A | Discharge status: Home / Self Care | Payer: 59 | Source: Ambulatory Visit | Attending: Internal Medicine | Admitting: Internal Medicine

## 2015-12-18 VITALS — BP 130/78 | HR 120 | Temp 99.2°F | Wt 220.0 lb

## 2015-12-18 DIAGNOSIS — R0789 Other chest pain: Secondary | ICD-10-CM

## 2015-12-18 DIAGNOSIS — R0602 Shortness of breath: Secondary | ICD-10-CM

## 2015-12-18 DIAGNOSIS — J189 Pneumonia, unspecified organism: Secondary | ICD-10-CM

## 2015-12-18 DIAGNOSIS — R509 Fever, unspecified: Secondary | ICD-10-CM | POA: Diagnosis not present

## 2015-12-18 DIAGNOSIS — J181 Lobar pneumonia, unspecified organism: Secondary | ICD-10-CM

## 2015-12-18 DIAGNOSIS — R079 Chest pain, unspecified: Secondary | ICD-10-CM | POA: Diagnosis not present

## 2015-12-18 LAB — D-DIMER, QUANTITATIVE: D-Dimer, Quant: 0.64 mcg/mL FEU — ABNORMAL HIGH (ref <0.50)

## 2015-12-18 MED ORDER — LEVOFLOXACIN 500 MG PO TABS: 500.0000 mg | ORAL_TABLET | Freq: Every day | ORAL | 0 refills | Status: DC

## 2015-12-18 NOTE — Patient Instructions (Signed)
Community-Acquired Pneumonia, Adult °Introduction °Pneumonia is an infection of the lungs. One type of pneumonia can happen while a person is in a hospital. A different type can happen when a person is not in a hospital (community-acquired pneumonia). It is easy for this kind to spread from person to person. It can spread to you if you breathe near an infected person who coughs or sneezes. Some symptoms include: °· A dry cough. °· A wet (productive) cough. °· Fever. °· Sweating. °· Chest pain. °Follow these instructions at home: °· Take over-the-counter and prescription medicines only as told by your doctor. °¨ Only take cough medicine if you are losing sleep. °¨ If you were prescribed an antibiotic medicine, take it as told by your doctor. Do not stop taking the antibiotic even if you start to feel better. °· Sleep with your head and neck raised (elevated). You can do this by putting a few pillows under your head, or you can sleep in a recliner. °· Do not use tobacco products. These include cigarettes, chewing tobacco, and e-cigarettes. If you need help quitting, ask your doctor. °· Drink enough water to keep your pee (urine) clear or pale yellow. °A shot (vaccine) can help prevent pneumonia. Shots are often suggested for: °· People older than 45 years of age. °· People older than 45 years of age: °¨ Who are having cancer treatment. °¨ Who have long-term (chronic) lung disease. °¨ Who have problems with their body's defense system (immune system). °You may also prevent pneumonia if you take these actions: °· Get the flu (influenza) shot every year. °· Go to the dentist as often as told. °· Wash your hands often. If soap and water are not available, use hand sanitizer. °Contact a doctor if: °· You have a fever. °· You lose sleep because your cough medicine does not help. °Get help right away if: °· You are short of breath and it gets worse. °· You have more chest pain. °· Your sickness gets worse. This is very  serious if: °¨ You are an older adult. °¨ Your body's defense system is weak. °· You cough up blood. °This information is not intended to replace advice given to you by your health care provider. Make sure you discuss any questions you have with your health care provider. °Document Released: 07/08/2007 Document Revised: 06/27/2015 Document Reviewed: 05/16/2014 °© 2017 Elsevier ° °

## 2015-12-18 NOTE — Progress Notes (Signed)
Subjective:    Patient ID: [name], male    DOB: [birth year], 45 y.o.   MRN: [id number]  HPI  Pt presents to the clinic today with c/o fatigue, chest tightness and shortness of breath. This started 1 week ago, but got worse in the last 2 days. He has pain in his chest with taking a deep breath. The shortness is more exertional than with rest. He denies chest pain. He denies runny nose, nasal congestion, ear pain, sore throat or cough. He has run low grade fevers, but denies chills or body aches. He has not tried anything OTC for this. He has not had sick contacts that he is aware of. He traveled to [place] about 8 weeks ago. He does not smoke but does chew occasionally. He has a history of blood clots in his abdomen secondary to an upper GI bleed. He has no family history of blood clotting disorder, abnormal bleeding.  Review of Systems      Past Medical History:  Diagnosis Date  . Anxiety    takes Xanax daily prn  . Constipation    related to pain meds  . Diabetes mellitus without complication ([place])   . Dizziness    occasionally  . External hemorrhoid, bleeding   . Gastric ulcer    history of  . GERD (gastroesophageal reflux disease)    takes Protonix bid  . Hiatal hernia   . History of blood clots 2012   in abdomen  . History of blood transfusion 2012  . Joint pain    knees  . Nocturia    depends on amount of fluid he drinks  . Primary localized osteoarthritis of left hip 04/24/2014  . Upper GI bleed   . Ventral hernia     Current Outpatient Prescriptions  Medication Sig Dispense Refill  . busPIRone (BUSPAR) 30 MG tablet Take 1 tablet (30 mg total) by mouth 2 (two) times daily. 180 tablet 1  . clonazePAM (KLONOPIN) 0.25 MG disintegrating tablet DISSOLVE 1 TABLET BY MOUTH TWICE DAILY AS NEEDED FOR ANXIETY 60 tablet 0  . pantoprazole (PROTONIX) 40 MG tablet Take 1 tablet (40 mg total) by mouth 2 (two) times daily. 180 tablet 1  . zolpidem (AMBIEN CR) 12.5 MG CR  tablet Take 1 tablet (12.5 mg total) by mouth at bedtime as needed for sleep. 30 tablet 0   No current facility-administered medications for this visit.     Allergies  Allergen Reactions  . Dilaudid [Hydromorphone Hcl] Rash and Other (See Comments)    Shakey  . Metoclopramide Hcl Hives, Swelling and Rash    Swelling in lips    Family History  Problem Relation Age of Onset  . Diabetes Mother   . Diabetes Father   . Heart disease Father     Social History   Social History  . Marital status: Single    Spouse name: N/A  . Number of children: N/A  . 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 Smoking status: Never Smoker  . Smokeless tobacco: Current User    Types: Chew     Comment: occ-Chew  . Alcohol use 0.0 oz/week     Comment: 09/04/11 "very seldom"  . Drug use: No  . Sexual activity: Yes   Other Topics Concern  . Not on file   Social History Narrative  . No narrative on file  Constitutional: Pt reports fever and fatigue. Denies malaise, headache or abrupt weight changes.  HEENT: Denies eye pain, eye redness, ear pain, ringing in the ears, wax buildup, runny nose, nasal congestion, bloody nose, or sore throat. Respiratory: Pt reports shortness of breath. Denies difficulty breathing, cough or sputum production.   Cardiovascular: Pt reports chest tightness. Denies chest pain, palpitations or swelling in the hands or feet.  Gastrointestinal: Denies abdominal pain, bloating, constipation, diarrhea or blood in the stool.   No other specific complaints in a complete review of systems (except as listed in HPI above).  Objective:   Physical Exam   BP 130/78   Pulse (!) 120   Temp 99.2 F (37.3 C) (Oral)   Wt 220 lb (99.8 kg)   SpO2 94%   BMI 32.49 kg/m  Wt Readings from Last 3 Encounters:  12/18/15 220 lb (99.8 kg)  12/12/15 220 lb 4 oz (99.9 kg)  11/28/15 216 lb 4 oz (98.1 kg)     General: Appears his stated age, ill appearing in NAD. Skin: Pale and clammy. HEENT: Head: normal shape and size, no sinus tenderness noted; Ears: Tm's gray and intact, normal light reflex; Throat/Mouth: Teeth present, mucosa pink and moist, no exudate, lesions or ulcerations noted.  Neck:  No adenopathy noted. Cardiovascular: Tachycardic with normal rhythm. S1,S2 noted.  No murmur, rubs or gallops noted.  Pulmonary/Chest: Normal effort and clear but diminished in the left lower lobe. Slightly decreased sats but no respiratory distress. No wheezes, rales or ronchi noted.   BMET    Component Value Date/Time   NA 135 04/26/2014 0555   K 3.8 04/26/2014 0555   CL 99 04/26/2014 0555   CO2 28 04/26/2014 0555   GLUCOSE 145 (H) 04/26/2014 0555   BUN 12 04/26/2014 0555   CREATININE 0.94 04/26/2014 0555   CALCIUM 8.8 04/26/2014 0555   GFRNONAA >90 04/26/2014 0555   GFRAA >90 04/26/2014 0555    Lipid Panel     Component Value Date/Time   CHOL 148 07/12/2013 0828   TRIG 134.0 07/12/2013 0828   HDL 49.90 07/12/2013 0828   CHOLHDL 3 07/12/2013 0828   VLDL 26.8 07/12/2013 0828   LDLCALC 71 07/12/2013 0828    CBC    Component Value Date/Time   WBC 10.0 04/27/2014 0512   RBC 3.71 (L) 04/27/2014 0512   HGB 11.4 (L) 04/27/2014 0512   HCT 34.1 (L) 04/27/2014 0512   PLT 270 04/27/2014 0512   MCV 91.9 04/27/2014 0512   MCH 30.7 04/27/2014 0512   MCHC 33.4 04/27/2014 0512   RDW 13.5 04/27/2014 0512   LYMPHSABS 2.2 07/24/2013 1051   MONOABS 1.3 (H) 07/24/2013 1051   EOSABS 0.2 07/24/2013 1051   BASOSABS 0.1 07/24/2013 1051    Hgb A1C Lab Results  Component Value Date   HGBA1C 5.9 07/22/2015        Assessment & Plan:   Shortness of breath, chest tightness, fever:  Concern for PE vs pneumonia Chest xray STAT shows left lower lobe infiltrate ECG shows sinus tachycardia, otherwise no acute finding CBC, CMET, d dimer If d dimer positive, will get CT chest angio Albuterol  neb in office 1 gm Rocephin IM eRx for Levaquin 500 mg daily x 7 days  Return precautions discussed, to ER if worse Webb Silversmith, NP

## 2015-12-19 ENCOUNTER — Ambulatory Visit (INDEPENDENT_AMBULATORY_CARE_PROVIDER_SITE_OTHER)
Admission: RE | Admit: 2015-12-19 | Discharge: 2015-12-19 | Disposition: A | Discharge status: Home / Self Care | Payer: 59 | Source: Ambulatory Visit | Attending: Internal Medicine | Admitting: Internal Medicine

## 2015-12-19 ENCOUNTER — Telehealth: Payer: Self-pay | Admitting: *Deleted

## 2015-12-19 ENCOUNTER — Other Ambulatory Visit: Payer: Self-pay | Admitting: Internal Medicine

## 2015-12-19 DIAGNOSIS — R0602 Shortness of breath: Secondary | ICD-10-CM

## 2015-12-19 DIAGNOSIS — R7989 Other specified abnormal findings of blood chemistry: Secondary | ICD-10-CM

## 2015-12-19 LAB — COMPREHENSIVE METABOLIC PANEL
ALBUMIN: 4.3 g/dL (ref 3.5–5.2)
ALK PHOS: 43 U/L (ref 39–117)
ALT: 35 U/L (ref 0–53)
AST: 30 U/L (ref 0–37)
BILIRUBIN TOTAL: 0.8 mg/dL (ref 0.2–1.2)
BUN: 12 mg/dL (ref 6–23)
CALCIUM: 9.9 mg/dL (ref 8.4–10.5)
CO2: 29 mEq/L (ref 19–32)
Chloride: 101 mEq/L (ref 96–112)
Creatinine, Ser: 1.13 mg/dL (ref 0.40–1.50)
GFR: 74.39 mL/min (ref >60.00)
GLUCOSE: 101 mg/dL — AB (ref 70–99)
Potassium: 4.7 mEq/L (ref 3.5–5.1)
Sodium: 139 mEq/L (ref 135–145)
TOTAL PROTEIN: 7.3 g/dL (ref 6.0–8.3)

## 2015-12-19 LAB — CBC
HCT: 44 % (ref 39.0–52.0)
HEMOGLOBIN: 14.9 g/dL (ref 13.0–17.0)
MCHC: 34 g/dL (ref 30.0–36.0)
MCV: 90.2 fl (ref 78.0–100.0)
PLATELETS: 343 10*3/uL (ref 150.0–400.0)
RBC: 4.88 Mil/uL (ref 4.22–5.81)
RDW: 13.6 % (ref 11.5–15.5)

## 2015-12-19 MED ORDER — IOPAMIDOL (ISOVUE-370) INJECTION 76%
80.0000 mL | Freq: Once | INTRAVENOUS | Status: AC | PRN
  Administered 2015-12-19: 80 mL via INTRAVENOUS

## 2015-12-19 NOTE — Telephone Encounter (Signed)
CT chest angio has been ordered

## 2015-12-19 NOTE — Telephone Encounter (Signed)
[place] Day - Client TELEPHONE ADVICE RECORD [facility] Patient Name: [name] Gender: Male DOB: February 02, 1971 Age: 45 Y 23 M 27 D Return Phone Number: Address: City/State/Zip: [employer] Client [place] Day - Client Client Site [place] - Day Physician Webb Silversmith - NP Contact Type Call Who Is Calling Lab Lab Name [facility] Lab Phone Number [phone] Lab Tech Name [facility] Reference Number Chief Complaint Lab Result (Critical or Stat) Call Type Lab Send to RN Reason for Call Report lab results Initial Comment Caller states she is from [facility] lab has a stat on a patient. Translation No Nurse Assessment Nurse: Duwaine Maxin, RN, Otila Kluver Date/Time (Eastern Time): 12/18/2015 9:57:27 PM Is there an on-call provider listed? ---Yes Please list name of person reporting value (Lab Employee) and a contact number. ---Shirlean Mylar [phone] Please document the following items: Lab name Lab value (read back to lab to verify) Reference range for lab value Date and time blood was drawn Collect time of birth for bilirubin results ---D Dimer 0.64. Reference range <0.5. Drawn today 12/18/15 3:55pm. Please collect the patient contact information from the lab. (name, phone number and address) ---Maryjane Hurter [phone] Guidelines Guideline Title Affirmed Question Affirmed Notes Nurse Date/Time Eilene Ghazi Time) Disp. Time Eilene Ghazi Time) Disposition Final User 12/18/2015 10:04:08 PM Lab Call Duwaine Maxin, RN, Otila Kluver Reason: Shirlean Mylar 12/18/2015 10:04:22 PM Clinical Call Yes [place], RN, Otila Kluver

## 2015-12-19 NOTE — Telephone Encounter (Signed)
Noted, pt being treated for pneumonia

## 2015-12-19 NOTE — Telephone Encounter (Signed)
[facility] lab called with critical result. Pt has WBC of 22.9. Results are in EPIC. [facility] notified of result.

## 2015-12-20 MED ORDER — CEFTRIAXONE SODIUM 1 G IJ SOLR
1.0000 g | Freq: Once | INTRAMUSCULAR | Status: AC
  Administered 2015-12-18: 1 g via INTRAMUSCULAR

## 2015-12-20 MED ORDER — ALBUTEROL SULFATE (2.5 MG/3ML) 0.083% IN NEBU
2.5000 mg | INHALATION_SOLUTION | Freq: Once | RESPIRATORY_TRACT | Status: AC
  Administered 2015-12-18: 2.5 mg via RESPIRATORY_TRACT

## 2015-12-20 NOTE — Addendum Note (Signed)
Addended by: Lurlean Nanny on: 12/20/2015 05:22 PM   Modules accepted: Orders

## 2016-01-06 ENCOUNTER — Encounter: Payer: Self-pay | Admitting: Internal Medicine

## 2016-01-06 ENCOUNTER — Other Ambulatory Visit: Payer: Self-pay | Admitting: Family Medicine

## 2016-01-06 ENCOUNTER — Ambulatory Visit (INDEPENDENT_AMBULATORY_CARE_PROVIDER_SITE_OTHER): Payer: 59 | Admitting: Internal Medicine

## 2016-01-06 ENCOUNTER — Encounter: Payer: Self-pay | Admitting: Family Medicine

## 2016-01-06 VITALS — BP 134/86 | HR 93 | Temp 98.9°F | Wt 227.0 lb

## 2016-01-06 DIAGNOSIS — J189 Pneumonia, unspecified organism: Secondary | ICD-10-CM

## 2016-01-06 DIAGNOSIS — J181 Lobar pneumonia, unspecified organism: Secondary | ICD-10-CM | POA: Diagnosis not present

## 2016-01-06 DIAGNOSIS — R0602 Shortness of breath: Secondary | ICD-10-CM | POA: Diagnosis not present

## 2016-01-06 DIAGNOSIS — R200 Anesthesia of skin: Secondary | ICD-10-CM

## 2016-01-06 DIAGNOSIS — M79641 Pain in right hand: Secondary | ICD-10-CM

## 2016-01-06 DIAGNOSIS — M79642 Pain in left hand: Secondary | ICD-10-CM

## 2016-01-06 MED ORDER — ALBUTEROL SULFATE HFA 108 (90 BASE) MCG/ACT IN AERS
2.0000 | INHALATION_SPRAY | Freq: Four times a day (QID) | RESPIRATORY_TRACT | 0 refills | Status: DC | PRN
Start: 2016-01-06 — End: 2016-11-23

## 2016-01-06 MED ORDER — CLONAZEPAM 0.25 MG PO TBDP: ORAL_TABLET | ORAL | 0 refills | Status: DC

## 2016-01-06 MED FILL — VENTOLIN HFA 90 MCG INHALER: 108 (90 BAS | 20 days supply | Qty: 18 | Fill #0

## 2016-01-06 NOTE — Progress Notes (Signed)
Subjective:    Patient ID: [name], male    DOB: 08-10-1970, 45 y.o.   MRN: [id number]  HPI  Pt presents to the clinic today to follow up CAP. He was seen 11/15, diagnosed with CAP. He was treated with Rocephin and Levaquin. He had a positive D dimer but CT chest angio was negative for PE. He has taken all the medication as prescribed, but reports he continues to have intermittent shortness of breath. He denies chest pain, cough, fever or chills. He has not tried anything additional OTC.  He also c/o numbness and cramping in his hand. This is intermittent. It started 1 month ago. He has trouble making fist with both his hands, describes it as feeling tight and swollen. He is not sure what is causing it. Nothing seems to make it better or worse. He has not tried anything OTC for this. He has no family history of RA.  Review of Systems      Past Medical History:  Diagnosis Date  . Anxiety    takes Xanax daily prn  . Constipation    related to pain meds  . Diabetes mellitus without complication ([place])   . Dizziness    occasionally  . External hemorrhoid, bleeding   . Gastric ulcer    history of  . GERD (gastroesophageal reflux disease)    takes Protonix bid  . Hiatal hernia   . History of blood clots 2012   in abdomen  . History of blood transfusion 2012  . Joint pain    knees  . Nocturia    depends on amount of fluid he drinks  . Primary localized osteoarthritis of left hip 04/24/2014  . Upper GI bleed   . Ventral hernia     Current Outpatient Prescriptions  Medication Sig Dispense Refill  . busPIRone (BUSPAR) 30 MG tablet Take 1 tablet (30 mg total) by mouth 2 (two) times daily. 180 tablet 1  . clonazePAM (KLONOPIN) 0.25 MG disintegrating tablet DISSOLVE 1 TABLET BY MOUTH TWICE DAILY AS NEEDED FOR ANXIETY 60 tablet 0  . pantoprazole (PROTONIX) 40 MG tablet Take 1 tablet (40 mg total) by mouth 2 (two) times daily. 180 tablet 1  . zolpidem (AMBIEN CR) 12.5 MG CR  tablet Take 1 tablet (12.5 mg total) by mouth at bedtime as needed for sleep. 30 tablet 0   No current facility-administered medications for this visit.     Allergies  Allergen Reactions  . Dilaudid [Hydromorphone Hcl] Rash and Other (See Comments)    Shakey  . Metoclopramide Hcl Hives, Swelling and Rash    Swelling in lips    Family History  Problem Relation Age of Onset  . Diabetes Mother   . Diabetes Father   . Heart disease Father     Social History   Social History  . Marital status: Single    Spouse name: N/A  . Number of children: N/A  . 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 Smoking status: Never Smoker  . Smokeless tobacco: Current User    Types: Chew     Comment: occ-Chew  . Alcohol use 0.0 oz/week     Comment: 09/04/11 "very seldom"  . Drug use: No  . Sexual activity: Yes   Other Topics Concern  . Not on file   Social History Narrative  . No narrative on file     Constitutional:  Denies fever, malaise, fatigue, headache or abrupt weight changes.  HEENT: Denies eye pain, eye redness, ear pain, ringing in the ears, wax buildup, runny nose, nasal congestion, bloody nose, or sore throat. Respiratory: Pt reports intermittent shortness of breath. Denies difficulty breathing, cough or sputum production.   Cardiovascular: Denies chest pain, chest tightness, palpitations or swelling in the hands or feet.  Musculoskeletal: Pt reports bilateral hand pain. Denies decrease in range of motion, difficulty with gait, or joint pain and swelling.  Skin: Denies redness, rashes, lesions or ulcercations.  Neurological: Pt reports numbness in bilateral hands. Denies dizziness, difficulty with memory, difficulty with speech or problems with balance and coordination.    No other specific complaints in a complete review of systems (except as listed in HPI above).  Objective:   Physical Exam   BP 134/86    Pulse 93   Temp 98.9 F (37.2 C) (Oral)   Wt 227 lb (103 kg)   SpO2 97%   BMI 33.52 kg/m  Wt Readings from Last 3 Encounters:  01/06/16 227 lb (103 kg)  12/18/15 220 lb (99.8 kg)  12/12/15 220 lb 4 oz (99.9 kg)    General: Appears his stated age, in NAD. Skin: Clammy. HEENT: Throat/Mouth: Teeth present, mucosa pink and moist, no exudate, lesions or ulcerations noted.  Neck:  No adenopathy noted. Cardiovascular: Normal rate and rhythm. S1,S2 noted.  No murmur, rubs or gallops noted. Radial pulses 2+ bilaterally. Pulmonary/Chest: Normal effort and positive vesicular breath sounds. No respiratory distress. No wheezes, rales or ronchi noted.  Musculoskeletal: Normal ROM of the neck, shoulders, wrist and fingers. No joint swelling noted. Strength 5/5 BUE. Neurological: Alert and oriented. Negative Tinel's, negative Phalen's.  BMET    Component Value Date/Time   NA 139 12/18/2015 1552   K 4.7 12/18/2015 1552   CL 101 12/18/2015 1552   CO2 29 12/18/2015 1552   GLUCOSE 101 (H) 12/18/2015 1552   BUN 12 12/18/2015 1552   CREATININE 1.13 12/18/2015 1552   CALCIUM 9.9 12/18/2015 1552   GFRNONAA >90 04/26/2014 0555   GFRAA >90 04/26/2014 0555    Lipid Panel     Component Value Date/Time   CHOL 148 07/12/2013 0828   TRIG 134.0 07/12/2013 0828   HDL 49.90 07/12/2013 0828   CHOLHDL 3 07/12/2013 0828   VLDL 26.8 07/12/2013 0828   LDLCALC 71 07/12/2013 0828    CBC    Component Value Date/Time   WBC 22.9 Repeated and verified X2. (HH) 12/18/2015 1552   RBC 4.88 12/18/2015 1552   HGB 14.9 12/18/2015 1552   HCT 44.0 12/18/2015 1552   PLT 343.0 12/18/2015 1552   MCV 90.2 12/18/2015 1552   MCH 30.7 04/27/2014 0512   MCHC 34.0 12/18/2015 1552   RDW 13.6 12/18/2015 1552   LYMPHSABS 2.2 07/24/2013 1051   MONOABS 1.3 (H) 07/24/2013 1051   EOSABS 0.2 07/24/2013 1051   BASOSABS 0.1 07/24/2013 1051    Hgb A1C Lab Results  Component Value Date   HGBA1C 5.9 07/22/2015            Assessment & Plan:   CAP:  Clinically improving eRx for Albuterol inhaler prn for shortness of breath Will repeat chest xray in 4 weeks  Bilateral hand pain:  He has an appt with GI tomorrow, advised to ask if he can take Aleve while on Protonix If so, let me know if it helps If not, will consider rheumatoid panel  RTC as needed or if symptoms persist  or worsen Webb Silversmith, NP

## 2016-01-06 NOTE — Telephone Encounter (Signed)
Last f/u 11/2015

## 2016-01-06 NOTE — Patient Instructions (Signed)
Community-Acquired Pneumonia, Adult °Introduction °Pneumonia is an infection of the lungs. One type of pneumonia can happen while a person is in a hospital. A different type can happen when a person is not in a hospital (community-acquired pneumonia). It is easy for this kind to spread from person to person. It can spread to you if you breathe near an infected person who coughs or sneezes. Some symptoms include: °· A dry cough. °· A wet (productive) cough. °· Fever. °· Sweating. °· Chest pain. °Follow these instructions at home: °· Take over-the-counter and prescription medicines only as told by your doctor. °¨ Only take cough medicine if you are losing sleep. °¨ If you were prescribed an antibiotic medicine, take it as told by your doctor. Do not stop taking the antibiotic even if you start to feel better. °· Sleep with your head and neck raised (elevated). You can do this by putting a few pillows under your head, or you can sleep in a recliner. °· Do not use tobacco products. These include cigarettes, chewing tobacco, and e-cigarettes. If you need help quitting, ask your doctor. °· Drink enough water to keep your pee (urine) clear or pale yellow. °A shot (vaccine) can help prevent pneumonia. Shots are often suggested for: °· People older than 45 years of age. °· People older than 45 years of age: °¨ Who are having cancer treatment. °¨ Who have long-term (chronic) lung disease. °¨ Who have problems with their body's defense system (immune system). °You may also prevent pneumonia if you take these actions: °· Get the flu (influenza) shot every year. °· Go to the dentist as often as told. °· Wash your hands often. If soap and water are not available, use hand sanitizer. °Contact a doctor if: °· You have a fever. °· You lose sleep because your cough medicine does not help. °Get help right away if: °· You are short of breath and it gets worse. °· You have more chest pain. °· Your sickness gets worse. This is very  serious if: °¨ You are an older adult. °¨ Your body's defense system is weak. °· You cough up blood. °This information is not intended to replace advice given to you by your health care provider. Make sure you discuss any questions you have with your health care provider. °Document Released: 07/08/2007 Document Revised: 06/27/2015 Document Reviewed: 05/16/2014 °© 2017 Elsevier ° °

## 2016-01-06 NOTE — Telephone Encounter (Signed)
Rx called in to requested pharmacy 

## 2016-01-07 ENCOUNTER — Encounter: Payer: Self-pay | Admitting: Internal Medicine

## 2016-01-07 ENCOUNTER — Ambulatory Visit (INDEPENDENT_AMBULATORY_CARE_PROVIDER_SITE_OTHER): Payer: 59 | Admitting: Internal Medicine

## 2016-01-07 VITALS — BP 136/86 | HR 84 | Ht 69.0 in | Wt 227.0 lb

## 2016-01-07 DIAGNOSIS — Z86010 Personal history of colonic polyps: Secondary | ICD-10-CM | POA: Diagnosis not present

## 2016-01-07 DIAGNOSIS — Z8719 Personal history of other diseases of the digestive system: Secondary | ICD-10-CM | POA: Diagnosis not present

## 2016-01-07 DIAGNOSIS — K219 Gastro-esophageal reflux disease without esophagitis: Secondary | ICD-10-CM | POA: Diagnosis not present

## 2016-01-07 DIAGNOSIS — K625 Hemorrhage of anus and rectum: Secondary | ICD-10-CM | POA: Diagnosis not present

## 2016-01-07 MED ORDER — NA SULFATE-K SULFATE-MG SULF 17.5-3.13-1.6 GM/177ML PO SOLN: ORAL | 0 refills | Status: DC

## 2016-01-07 MED FILL — SUPREP BOWEL PREP KIT: 17.5-3.13-1 | 1 days supply | Qty: 354 | Fill #0

## 2016-01-07 NOTE — Patient Instructions (Signed)
You have been scheduled for an endoscopy and colonoscopy. Please follow the written instructions given to you at your visit today. Please pick up your prep supplies at the pharmacy within the next 1-3 days. If you use inhalers (even only as needed), please bring them with you on the day of your procedure. Your physician has requested that you go to www.startemmi.com and enter the access code given to you at your visit today. This web site gives a general overview about your procedure. However, you should still follow specific instructions given to you by our office regarding your preparation for the procedure.  Please continue your Protonix 40 mg twice daily  If you are age 7 or older, your body mass index should be between 23-30. Your Body mass index is 33.52 kg/m. If this is out of the aforementioned range listed, please consider follow up with your Primary Care Provider.  If you are age 9 or younger, your body mass index should be between 19-25. Your Body mass index is 33.52 kg/m. If this is out of the aformentioned range listed, please consider follow up with your Primary Care Provider.

## 2016-01-07 NOTE — Progress Notes (Signed)
Patient ID: KUNTE CARDA, male   DOB: 1970/03/26, 45 y.o.   MRN: [id number] HPI: [name] is a 45 year old male with a history of adenomatous colon polyps and sessile serrated colon polyp, GERD with esophagitis, history of gastric ulcer, who is seen in consultation at the request of Dr. Deborra Medina to evaluate intermittent rectal bleeding. He is here alone today. He is recovering from recent pneumonia. He was treated with ceftriaxone 1 dose and then 7 days of levofloxacin. He is still having some shortness of breath and saw primary care yesterday and was started on an inhaler.  From a GI perspective he takes pantoprazole 40 mg twice daily. When he takes it once daily he has nocturnal breakthrough heartburn. Currently asymptomatic on twice a day pantoprazole. No dysphagia or odynophagia. No nausea or vomiting.  He does report intermittent rectal bleeding associated with bowel movement. Tends to be more prevalent if he has a hard stool or strains with stool. He sees it mostly with wiping but also occasionally in the toilet. Occasionally it feels uncomfortable to white but he denies pain with passing of the stool. No abdominal pain. He reports about 2 bowel movements per day with rare constipation. Denies diarrhea.  He does have a significant history of colon polyps. Dr. Penelope Coop performed colonoscopy in February 2014. He had one tubular adenoma in the descending colon and one sessile serrated polyp in the ascending colon. Upper endoscopy the same time showed erosive esophagitis with biopsies negative for metaplasia. He had antral biopsies which were benign and negative for H. pylori.  Past Medical History:  Diagnosis Date  . Anxiety    takes Xanax daily prn  . Constipation    related to pain meds  . Diabetes mellitus without complication ([place])   . Dizziness    occasionally  . External hemorrhoid, bleeding   . Gastric ulcer    history of  . GERD (gastroesophageal reflux disease)    takes Protonix bid   . Hiatal hernia   . History of blood clots 2012   in abdomen  . History of blood transfusion 2012  . Joint pain    knees  . Nocturia    depends on amount of fluid he drinks  . Primary localized osteoarthritis of left hip 04/24/2014  . Upper GI bleed   . Ventral hernia     Past Surgical History:  Procedure Laterality Date  . ABDOMINAL SURGERY  10/2010   d/t bleeding ulcers; "oversewing of gastric ulcer"  . ESOPHAGOGASTRODUODENOSCOPY    . HERNIA REPAIR  09/04/11   incisional   . INCISIONAL HERNIA REPAIR  09/04/2011   Procedure: HERNIA REPAIR INCISIONAL;  Surgeon: Madilyn Hook, DO;  Location: [facility];  Service: General;  Laterality: N/A;  debridment calcified mass  . TOTAL HIP ARTHROPLASTY Left 04/24/2014   Procedure: LEFT TOTAL HIP ARTHROPLASTY;  Surgeon: Marchia Bond, MD;  Location: [facility];  Service: Orthopedics;  Laterality: Left;    Outpatient Medications Prior to Visit  Medication Sig Dispense Refill  . albuterol (PROVENTIL HFA;VENTOLIN HFA) 108 (90 Base) MCG/ACT inhaler Inhale 2 puffs into the lungs every 6 (six) hours as needed for wheezing or shortness of breath. 1 Inhaler 0  . busPIRone (BUSPAR) 30 MG tablet Take 1 tablet (30 mg total) by mouth 2 (two) times daily. 180 tablet 1  . clonazePAM (KLONOPIN) 0.25 MG disintegrating tablet DISSOLVE 1 TABLET BY MOUTH TWICE DAILY AS NEEDED FOR ANXIETY 60 tablet 0  . pantoprazole (PROTONIX) 40 MG tablet Take  1 tablet (40 mg total) by mouth 2 (two) times daily. 180 tablet 1  . zolpidem (AMBIEN CR) 12.5 MG CR tablet Take 1 tablet (12.5 mg total) by mouth at bedtime as needed for sleep. 30 tablet 0   No facility-administered medications prior to visit.     Allergies  Allergen Reactions  . Dilaudid [Hydromorphone Hcl] Rash and Other (See Comments)    Shakey  . Metoclopramide Hcl Hives, Swelling and Rash    Swelling in lips    Family History  Problem Relation Age of Onset  . Diabetes Mother   . Liver disease Mother   . Diabetes Father    . Heart disease Father   . Colon cancer Neg Hx   . Stomach cancer Neg Hx   . Rectal cancer Neg Hx   . Esophageal cancer Neg Hx     Social History  Substance Use Topics  . Smoking status: Never Smoker  . Smokeless tobacco: Current User    Types: Chew     Comment: occ-Chew  . Alcohol use 0.0 oz/week     Comment: 09/04/11 "very seldom"    ROS: As per history of present illness, otherwise negative  BP 136/86   Pulse 84   Ht 5\' 9"  (1.753 m)   Wt 227 lb (103 kg)   BMI 33.52 kg/m  Constitutional: Well-developed and well-nourished. No distress. HEENT: Normocephalic and atraumatic. Oropharynx is clear and moist. No oropharyngeal exudate. Conjunctivae are normal.  No scleral icterus. Neck: Neck supple. Trachea midline. Cardiovascular: Normal rate, regular rhythm and intact distal pulses. No M/R/G Pulmonary/chest: Effort normal and breath sounds normal. No wheezing, rales or rhonchi. Abdominal: Soft, nontender, nondistended. Bowel sounds active throughout. There are no masses palpable. No hepatosplenomegaly. Extremities: no clubbing, cyanosis, or edema Lymphadenopathy: No cervical adenopathy noted. Neurological: Alert and oriented to person place and time. Skin: Skin is warm and dry. No rashes noted. Psychiatric: Normal mood and affect. Behavior is normal.  RELEVANT LABS AND IMAGING: CBC    Component Value Date/Time   WBC 22.9 Repeated and verified X2. (HH) 12/18/2015 1552   RBC 4.88 12/18/2015 1552   HGB 14.9 12/18/2015 1552   HCT 44.0 12/18/2015 1552   PLT 343.0 12/18/2015 1552   MCV 90.2 12/18/2015 1552   MCH 30.7 04/27/2014 0512   MCHC 34.0 12/18/2015 1552   RDW 13.6 12/18/2015 1552   LYMPHSABS 2.2 07/24/2013 1051   MONOABS 1.3 (H) 07/24/2013 1051   EOSABS 0.2 07/24/2013 1051   BASOSABS 0.1 07/24/2013 1051    CMP     Component Value Date/Time   NA 139 12/18/2015 1552   K 4.7 12/18/2015 1552   CL 101 12/18/2015 1552   CO2 29 12/18/2015 1552   GLUCOSE 101 (H)  12/18/2015 1552   BUN 12 12/18/2015 1552   CREATININE 1.13 12/18/2015 1552   CALCIUM 9.9 12/18/2015 1552   PROT 7.3 12/18/2015 1552   ALBUMIN 4.3 12/18/2015 1552   AST 30 12/18/2015 1552   ALT 35 12/18/2015 1552   ALKPHOS 43 12/18/2015 1552   BILITOT 0.8 12/18/2015 1552   GFRNONAA >90 04/26/2014 0555   GFRAA >90 04/26/2014 0555    ASSESSMENT/PLAN: 45 year old male with a history of adenomatous colon polyps and sessile serrated colon polyp, GERD with esophagitis, history of gastric ulcer, who is seen in consultation at the request of Dr. Deborra Medina to evaluate intermittent rectal bleeding.  1. Rectal bleeding -- certainly be internal hemorrhoids but given his history of prior precancerous colon polyps  at a young age of recommended repeating colonoscopy. We discussed the risk benefits and alternatives and he wishes to proceed. If hemorrhoids are found without other pathology he would be a good candidate for hemorrhoidal banding. We discussed this today and will proceed depending on findings at colonoscopy  2. History of reflux and esophagitis -- no Barrett's at endoscopy nearly 4 years ago but he did have esophagitis. We'll repeat endoscopy to ensure esophagitis is healed and exclude Barrett's. If negative he should not need repeat surveillance endoscopy. Continue pantoprazole 40 mg twice a day before meals      Cc:[name], Md [place], [place] [address]

## 2016-01-08 MED FILL — clonazePAM 0.5 MG TBDP: 0.5 | 30 days supply | Qty: 60 | Fill #0

## 2016-01-10 ENCOUNTER — Other Ambulatory Visit: Payer: Self-pay | Admitting: Family Medicine

## 2016-01-10 ENCOUNTER — Encounter: Payer: Self-pay | Admitting: Family Medicine

## 2016-01-10 MED ORDER — ZOLPIDEM TARTRATE ER 12.5 MG PO TBCR: 12.5000 mg | EXTENDED_RELEASE_TABLET | Freq: Every evening | ORAL | 0 refills | Status: DC | PRN

## 2016-01-10 NOTE — Telephone Encounter (Signed)
Last f/u 11/2015

## 2016-01-10 NOTE — Telephone Encounter (Signed)
Rx called in to requested pharmacy 

## 2016-01-13 ENCOUNTER — Encounter: Payer: Self-pay | Admitting: Family Medicine

## 2016-01-16 ENCOUNTER — Encounter: Payer: Self-pay | Admitting: Family Medicine

## 2016-01-16 MED ORDER — PANTOPRAZOLE SODIUM 40 MG PO TBEC: 40.0000 mg | DELAYED_RELEASE_TABLET | Freq: Two times a day (BID) | ORAL | 2 refills | Status: DC

## 2016-01-20 ENCOUNTER — Ambulatory Visit (INDEPENDENT_AMBULATORY_CARE_PROVIDER_SITE_OTHER): Payer: 59 | Admitting: Internal Medicine

## 2016-01-20 ENCOUNTER — Encounter: Payer: Self-pay | Admitting: Internal Medicine

## 2016-01-20 ENCOUNTER — Ambulatory Visit (INDEPENDENT_AMBULATORY_CARE_PROVIDER_SITE_OTHER)
Admission: RE | Admit: 2016-01-20 | Discharge: 2016-01-20 | Disposition: A | Discharge status: Home / Self Care | Payer: 59 | Source: Ambulatory Visit | Attending: Internal Medicine | Admitting: Internal Medicine

## 2016-01-20 VITALS — BP 132/84 | HR 103 | Temp 98.5°F | Wt 221.0 lb

## 2016-01-20 DIAGNOSIS — W1800XA Striking against unspecified object with subsequent fall, initial encounter: Secondary | ICD-10-CM

## 2016-01-20 DIAGNOSIS — M7989 Other specified soft tissue disorders: Secondary | ICD-10-CM | POA: Diagnosis not present

## 2016-01-20 DIAGNOSIS — M25551 Pain in right hip: Secondary | ICD-10-CM

## 2016-01-20 DIAGNOSIS — S79911A Unspecified injury of right hip, initial encounter: Secondary | ICD-10-CM | POA: Diagnosis not present

## 2016-01-20 MED ORDER — HYDROCODONE-ACETAMINOPHEN 5-325 MG PO TABS: 1.0000 | ORAL_TABLET | Freq: Four times a day (QID) | ORAL | 0 refills | Status: DC | PRN

## 2016-01-20 MED FILL — PANTOPRAZOLE SOD DR 40 MG T: 40 | 90 days supply | Qty: 180 | Fill #0

## 2016-01-20 MED FILL — HYDROCODON-APAP 5-325: 5-325 | 7 days supply | Qty: 30 | Fill #0

## 2016-01-20 NOTE — Patient Instructions (Signed)
Hip Pain  Your hip is the joint between your upper legs and your lower pelvis. The bones, cartilage, tendons, and muscles of your hip joint perform a lot of work each day supporting your body weight and allowing you to move around.  Hip pain can range from a minor ache to severe pain in one or both of your hips. Pain may be felt on the inside of the hip joint near the groin, or the outside near the buttocks and upper thigh. You may have swelling or stiffness as well.  Follow these instructions at home:  ? Take medicines only as directed by your health care provider.  ? Apply ice to the injured area:  ? Put ice in a plastic bag.  ? Place a towel between your skin and the bag.  ? Leave the ice on for 15?20 minutes at a time, 3?4 times a day.  ? Keep your leg raised (elevated) when possible to lessen swelling.  ? Avoid activities that cause pain.  ? Follow specific exercises as directed by your health care provider.  ? Sleep with a pillow between your legs on your most comfortable side.  ? Record how often you have hip pain, the location of the pain, and what it feels like.  Contact a health care provider if:  ? You are unable to put weight on your leg.  ? Your hip is red or swollen or very tender to touch.  ? Your pain or swelling continues or worsens after 1 week.  ? You have increasing difficulty walking.  ? You have a fever.  Get help right away if:  ? You have fallen.  ? You have a sudden increase in pain and swelling in your hip.  This information is not intended to replace advice given to you by your health care provider. Make sure you discuss any questions you have with your health care provider.  Document Released: 07/09/2009 Document Revised: 06/27/2015 Document Reviewed: 09/15/2012  Elsevier Interactive Patient Education ? 2017 [employer]

## 2016-01-20 NOTE — Progress Notes (Signed)
Subjective:    Patient ID: [name], male    DOB: 1971/01/31, 45 y.o.   MRN: [id number]  HPI  Pt presents to the clinic today with c/o right hip pain. This started after he fell on ice 1 week ago. He reports he was leaving his house, and slipped on some concrete stairs. He has noticed pain and bruising of his right hip, extending down his thigh. He reports the pain is worse with certain movements and walking. He has tried Tylenol and Aleve with no relief.  Review of Systems      Past Medical History:  Diagnosis Date  . Anxiety    takes Xanax daily prn  . Constipation    related to pain meds  . Diabetes mellitus without complication ([place])   . Dizziness    occasionally  . External hemorrhoid, bleeding   . Gastric ulcer    history of  . GERD (gastroesophageal reflux disease)    takes Protonix bid  . Hiatal hernia   . History of blood clots 2012   in abdomen  . History of blood transfusion 2012  . Joint pain    knees  . Nocturia    depends on amount of fluid he drinks  . Primary localized osteoarthritis of left hip 04/24/2014  . Upper GI bleed   . Ventral hernia     Current Outpatient Prescriptions  Medication Sig Dispense Refill  . albuterol (PROVENTIL HFA;VENTOLIN HFA) 108 (90 Base) MCG/ACT inhaler Inhale 2 puffs into the lungs every 6 (six) hours as needed for wheezing or shortness of breath. 1 Inhaler 0  . busPIRone (BUSPAR) 30 MG tablet Take 1 tablet (30 mg total) by mouth 2 (two) times daily. 180 tablet 1  . clonazePAM (KLONOPIN) 0.25 MG disintegrating tablet DISSOLVE 1 TABLET BY MOUTH TWICE DAILY AS NEEDED FOR ANXIETY 60 tablet 0  . Na Sulfate-K Sulfate-Mg Sulf 17.5-3.13-1.6 GM/180ML SOLN Suprep-Use as directed 354 mL 0  . pantoprazole (PROTONIX) 40 MG tablet Take 1 tablet (40 mg total) by mouth 2 (two) times daily. 180 tablet 2  . zolpidem (AMBIEN CR) 12.5 MG CR tablet Take 1 tablet (12.5 mg total) by mouth at bedtime as needed for sleep. 30 tablet 0  .  clonazePAM (KLONOPIN) 0.5 MG disintegrating tablet   0   No current facility-administered medications for this visit.     Allergies  Allergen Reactions  . Dilaudid [Hydromorphone Hcl] Rash and Other (See Comments)    Shakey  . Metoclopramide Hcl Hives, Swelling and Rash    Swelling in lips    Family History  Problem Relation Age of Onset  . Diabetes Mother   . Liver disease Mother   . Diabetes Father   . Heart disease Father   . Colon cancer Neg Hx   . Stomach cancer Neg Hx   . Rectal cancer Neg Hx   . Esophageal cancer Neg Hx     Social History   Social History  . Marital status: Single    Spouse name: N/A  . Number of children: 1  . 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 Smoking status: Never Smoker  . Smokeless tobacco: Current User    Types: Chew     Comment: occ-Chew  . Alcohol use 0.0 oz/week     Comment: 09/04/11 "very seldom"  . Drug use: No  . Sexual activity: Yes  Other Topics Concern  . Not on file   Social History Narrative  . No narrative on file     Constitutional: Denies fever, malaise, fatigue, headache or abrupt weight changes.  Musculoskeletal: Pt reports hip pain. Denies difficulty with gait, muscle pain.  Skin: Pt reports bruising of right hip. Denies redness, rashes, lesions or ulcercations.    No other specific complaints in a complete review of systems (except as listed in HPI above).  Objective:   Physical Exam  BP 132/84   Pulse (!) 103   Temp 98.5 F (36.9 C) (Oral)   Wt 221 lb (100.2 kg)   SpO2 98%   BMI 32.64 kg/m  Wt Readings from Last 3 Encounters:  01/20/16 221 lb (100.2 kg)  01/07/16 227 lb (103 kg)  01/06/16 227 lb (103 kg)    General: Appears her stated age, in NAD. Skin: Bruising noted from right hip down right lateral thigh to the right lateral knee. Musculoskeletal: The right hip is so swollen, that you can palpate the bony structure.  Decreased flexion of the right hip. Normal extension. Pain with internal and external rotation of the right hip. He is limping when he walks.   BMET    Component Value Date/Time   NA 139 12/18/2015 1552   K 4.7 12/18/2015 1552   CL 101 12/18/2015 1552   CO2 29 12/18/2015 1552   GLUCOSE 101 (H) 12/18/2015 1552   BUN 12 12/18/2015 1552   CREATININE 1.13 12/18/2015 1552   CALCIUM 9.9 12/18/2015 1552   GFRNONAA >90 04/26/2014 0555   GFRAA >90 04/26/2014 0555    Lipid Panel     Component Value Date/Time   CHOL 148 07/12/2013 0828   TRIG 134.0 07/12/2013 0828   HDL 49.90 07/12/2013 0828   CHOLHDL 3 07/12/2013 0828   VLDL 26.8 07/12/2013 0828   LDLCALC 71 07/12/2013 0828    CBC    Component Value Date/Time   WBC 22.9 Repeated and verified X2. (HH) 12/18/2015 1552   RBC 4.88 12/18/2015 1552   HGB 14.9 12/18/2015 1552   HCT 44.0 12/18/2015 1552   PLT 343.0 12/18/2015 1552   MCV 90.2 12/18/2015 1552   MCH 30.7 04/27/2014 0512   MCHC 34.0 12/18/2015 1552   RDW 13.6 12/18/2015 1552   LYMPHSABS 2.2 07/24/2013 1051   MONOABS 1.3 (H) 07/24/2013 1051   EOSABS 0.2 07/24/2013 1051   BASOSABS 0.1 07/24/2013 1051    Hgb A1C Lab Results  Component Value Date   HGBA1C 5.9 07/22/2015        Assessment & Plan:   Right hip pain s/p fall on ice:  Xray right hip today If fractured, will send him back to Dr. Nyra Jabs for Norco 5-325 mg TID prn for pain Ice and ibuprofen may be helpful  Will follow up after xray, RTC as needed Webb Silversmith, NP

## 2016-02-03 DIAGNOSIS — J189 Pneumonia, unspecified organism: Secondary | ICD-10-CM

## 2016-02-03 HISTORY — DX: Pneumonia, unspecified organism: J18.9

## 2016-02-07 ENCOUNTER — Encounter: Payer: Self-pay | Admitting: Family Medicine

## 2016-02-07 NOTE — Telephone Encounter (Signed)
Last f/u 10/2015

## 2016-02-11 ENCOUNTER — Encounter: Payer: Self-pay | Admitting: Family Medicine

## 2016-02-11 MED ORDER — ZOLPIDEM TARTRATE ER 12.5 MG PO TBCR: 12.5000 mg | EXTENDED_RELEASE_TABLET | Freq: Every evening | ORAL | 0 refills | Status: DC | PRN

## 2016-02-11 NOTE — Telephone Encounter (Signed)
Last f/u 11/2015

## 2016-02-12 ENCOUNTER — Encounter: Payer: Self-pay | Admitting: Family Medicine

## 2016-02-12 ENCOUNTER — Other Ambulatory Visit: Payer: Self-pay | Admitting: Family Medicine

## 2016-02-12 NOTE — Telephone Encounter (Signed)
Last f/u 10/2015

## 2016-02-12 NOTE — Telephone Encounter (Signed)
Rx called in to requested pharmacy 

## 2016-02-13 MED FILL — clonazePAM 0.5 MG TBDP: 0.5 | 30 days supply | Qty: 60 | Fill #0

## 2016-02-13 NOTE — Telephone Encounter (Signed)
Rx called in to requested pharmacy 

## 2016-02-20 ENCOUNTER — Encounter: Payer: Self-pay | Admitting: Internal Medicine

## 2016-02-28 ENCOUNTER — Ambulatory Visit (AMBULATORY_SURGERY_CENTER): Payer: 59 | Admitting: Internal Medicine

## 2016-02-28 ENCOUNTER — Encounter: Payer: Self-pay | Admitting: Internal Medicine

## 2016-02-28 VITALS — BP 108/80 | HR 78 | Temp 98.0°F | Resp 13 | Ht 69.0 in | Wt 227.0 lb

## 2016-02-28 DIAGNOSIS — K625 Hemorrhage of anus and rectum: Secondary | ICD-10-CM

## 2016-02-28 DIAGNOSIS — Z86010 Personal history of colonic polyps: Secondary | ICD-10-CM

## 2016-02-28 DIAGNOSIS — K2100 Gastro-esophageal reflux disease with esophagitis, without bleeding: Secondary | ICD-10-CM

## 2016-02-28 DIAGNOSIS — D120 Benign neoplasm of cecum: Secondary | ICD-10-CM

## 2016-02-28 DIAGNOSIS — K219 Gastro-esophageal reflux disease without esophagitis: Secondary | ICD-10-CM | POA: Diagnosis not present

## 2016-02-28 DIAGNOSIS — F419 Anxiety disorder, unspecified: Secondary | ICD-10-CM | POA: Diagnosis not present

## 2016-02-28 MED ORDER — SODIUM CHLORIDE 0.9 % IV SOLN: 500.0000 mL | INTRAVENOUS | Status: DC

## 2016-02-28 MED ORDER — SUCRALFATE 1 GM/10ML PO SUSP
1.0000 g | Freq: Three times a day (TID) | ORAL | 5 refills | Status: DC
Start: 2016-02-28 — End: 2017-01-13

## 2016-02-28 MED ORDER — SUCRALFATE 1 GM/10ML PO SUSP: 1.0000 g | Freq: Three times a day (TID) | ORAL | 5 refills | Status: DC

## 2016-02-28 MED ORDER — RANITIDINE HCL 150 MG PO TABS: 150.0000 mg | ORAL_TABLET | Freq: Every day | ORAL | 3 refills | Status: DC

## 2016-02-28 MED FILL — raNITIdine HCL 150 MG TABS: 150 | 90 days supply | Qty: 90 | Fill #0

## 2016-02-28 NOTE — Op Note (Signed)
[place] Patient Name: Kairi Ocasio Procedure Date: 02/28/2016 2:25 PM MRN: [id number] Endoscopist: Jerene Bears , MD Age: 46 Referring MD:  Date of Birth: Jun 25, 1970 Gender: Male Account #: [id number] Procedure:                Upper GI endoscopy Indications:              Follow-up of reflux esophagitis seen in 2014,                            history of GERD Medicines:                Monitored Anesthesia Care Procedure:                Pre-Anesthesia Assessment:                           - Prior to the procedure, a History and Physical                            was performed, and patient medications and                            allergies were reviewed. The patient's tolerance of                            previous anesthesia was also reviewed. The risks                            and benefits of the procedure and the sedation                            options and risks were discussed with the patient.                            All questions were answered, and informed consent                            was obtained. Prior Anticoagulants: The patient has                            taken no previous anticoagulant or antiplatelet                            agents. ASA Grade Assessment: II - A patient with                            mild systemic disease. After reviewing the risks                            and benefits, the patient was deemed in                            satisfactory condition to undergo the procedure.  After obtaining informed consent, the endoscope was                            passed under direct vision. Throughout the                            procedure, the patient's blood pressure, pulse, and                            oxygen saturations were monitored continuously. The                            Model GIF-HQ190 [id number]) scope was introduced                            through the mouth, and advanced to the second  part                            of duodenum. The upper GI endoscopy was                            accomplished without difficulty. The patient                            tolerated the procedure well. Scope In: Scope Out: Findings:                 LA Grade D (one or more mucosal breaks involving at                            least 75% of esophageal circumference) esophagitis                            with no bleeding was found 30 to 35 cm from the                            incisors. Multiple biopsies were obtained with cold                            forceps for histology in a targeted manner in the                            lower third of the esophagus and at the                            gastroesophageal junction.                           A 5 cm hiatal hernia was found. The proximal extent                            of the gastric folds (end of tubular esophagus) was  35 cm from the incisors. The hiatal narrowing was                            40 cm from the incisors.                           Multiple non-bleeding localized erosions were found                            in the cardia at site of diaphragmatic hiatus                            (Cameron's erosions).                           A previously placed endoclip was found in the                            gastric body.                           The exam of the stomach was otherwise normal.                           The examined duodenum was normal. Complications:            No immediate complications. Estimated Blood Loss:     Estimated blood loss was minimal. Impression:               - LA Grade D reflux, acute and erosive esophagitis.                           - 5 cm hiatal hernia.                           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                           - A previously placed endoclip was found in the                            stomach.                           - Normal  examined duodenum.                           - Multiple biopsies were obtained in the lower                            third of the esophagus and at the gastroesophageal                            junction. Recommendation:           - Patient has a contact number available for  emergencies. The signs and symptoms of potential                            delayed complications were discussed with the                            patient. Return to normal activities tomorrow.                            Written discharge instructions were provided to the                            patient.                           - Anti-reflux diet.                           - Continue present medications including                            pantoprazolr 40 mg twice daily before breakfast and                            dinner. Begin ranitidine 150 mg at bedtime.                           - Await pathology results.                           - Repeat upper endoscopy in 12 weeks to check                            healing. Jerene Bears, MD 02/28/2016 3:04:11 PM This report has been signed electronically.

## 2016-02-28 NOTE — Op Note (Signed)
[place] Patient Name: [name] Procedure Date: 02/28/2016 2:25 PM MRN: [id number] Endoscopist: Jerene Bears , MD Age: 46 Referring MD:  Date of Birth: [birth year] Gender: Male Account #: [id number] Procedure:                Colonoscopy Indications:              High risk colon cancer surveillance: Personal                            history of colonic polyps, Last colonoscopy: 2014,                            rectal bleeding Medicines:                Monitored Anesthesia Care Procedure:                Pre-Anesthesia Assessment:                           - Prior to the procedure, a History and Physical                            was performed, and patient medications and                            allergies were reviewed. The patient's tolerance of                            previous anesthesia was also reviewed. The risks                            and benefits of the procedure and the sedation                            options and risks were discussed with the patient.                            All questions were answered, and informed consent                            was obtained. Prior Anticoagulants: The patient has                            taken no previous anticoagulant or antiplatelet                            agents. ASA Grade Assessment: II - A patient with                            mild systemic disease. After reviewing the risks                            and benefits, the patient was deemed in  satisfactory condition to undergo the procedure.                           After obtaining informed consent, the colonoscope                            was passed under direct vision. Throughout the                            procedure, the patient's blood pressure, pulse, and                            oxygen saturations were monitored continuously. The                            Model CF-HQ190L [id number]) scope was introduced                           through the anus and advanced to the the cecum,                            identified by appendiceal orifice and ileocecal                            valve. The colonoscopy was performed without                            difficulty. The patient tolerated the procedure                            well. The quality of the bowel preparation was                            good. The ileocecal valve, appendiceal orifice, and                            rectum were photographed. Scope In: 2:39:32 PM Scope Out: 2:53:00 PM Scope Withdrawal Time: 0 hours 10 minutes 48 seconds  Total Procedure Duration: 0 hours 13 minutes 28 seconds  Findings:                 The perianal exam findings include skin tags.                           A 5 mm polyp was found in the ileocecal valve. The                            polyp was sessile. The polyp was removed with a                            cold snare. Resection and retrieval were complete.                           Internal hemorrhoids were found during retroflexion  with anal skin tags. The hemorrhoids were small. Complications:            No immediate complications. Estimated Blood Loss:     Estimated blood loss was minimal. Impression:               - Perianal skin tags found on perianal exam.                           - One 5 mm polyp at the ileocecal valve, removed                            with a cold snare. Resected and retrieved.                           - Internal hemorrhoids. Recommendation:           - Patient has a contact number available for                            emergencies. The signs and symptoms of potential                            delayed complications were discussed with the                            patient. Return to normal activities tomorrow.                            Written discharge instructions were provided to the                            patient.                            - Resume previous diet.                           - Continue present medications.                           - Await pathology results.                           - Repeat colonoscopy is recommended. The                            colonoscopy date will be determined after pathology                            results from today's exam become available for                            review. Jerene Bears, MD 02/28/2016 3:07:14 PM This report has been signed electronically.

## 2016-02-28 NOTE — Progress Notes (Signed)
Schedule doesn't go 12 weeks- recall put in for EGD in 12 weeks.  Staff message sent to [facility]

## 2016-02-28 NOTE — Patient Instructions (Signed)
YOU HAD AN ENDOSCOPIC PROCEDURE TODAY AT [facility]:   Refer to the procedure report that was given to you for any specific questions about what was found during the examination.  If the procedure report does not answer your questions, please call your gastroenterologist to clarify.  If you requested that your care partner not be given the details of your procedure findings, then the procedure report has been included in a sealed envelope for you to review at your convenience later.  YOU SHOULD EXPECT: Some feelings of bloating in the abdomen. Passage of more gas than usual.  Walking can help get rid of the air that was put into your GI tract during the procedure and reduce the bloating. If you had a lower endoscopy (such as a colonoscopy or flexible sigmoidoscopy) you may notice spotting of blood in your stool or on the toilet paper. If you underwent a bowel prep for your procedure, you may not have a normal bowel movement for a few days.  Please Note:  You might notice some irritation and congestion in your nose or some drainage.  This is from the oxygen used during your procedure.  There is no need for concern and it should clear up in a day or so.  SYMPTOMS TO REPORT IMMEDIATELY:   Following lower endoscopy (colonoscopy or flexible sigmoidoscopy):  Excessive amounts of blood in the stool  Significant tenderness or worsening of abdominal pains  Swelling of the abdomen that is new, acute  Fever of 100F or higher   Following upper endoscopy (EGD)  Vomiting of blood or coffee ground material  New chest pain or pain under the shoulder blades  Painful or persistently difficult swallowing  New shortness of breath  Fever of 100F or higher  Black, tarry-looking stools  For urgent or emergent issues, a gastroenterologist can be reached at any hour by calling [phone].   DIET:  We do recommend a small meal at first, but then you may proceed to your regular diet.  Drink  plenty of fluids but you should avoid alcoholic beverages for 24 hours.  ACTIVITY:  You should plan to take it easy for the rest of today and you should NOT DRIVE or use heavy machinery until tomorrow (because of the sedation medicines used during the test).    FOLLOW UP: Our staff will call the number listed on your records the next business day following your procedure to check on you and address any questions or concerns that you may have regarding the information given to you following your procedure. If we do not reach you, we will leave a message.  However, if you are feeling well and you are not experiencing any problems, there is no need to return our call.  We will assume that you have returned to your regular daily activities without incident.  If any biopsies were taken you will be contacted by phone or by letter within the next 1-3 weeks.  Please call us at [phone] if you have not heard about the biopsies in 3 weeks.    SIGNATURES/CONFIDENTIALITY: You and/or your care partner have signed paperwork which will be entered into your electronic medical record.  These signatures attest to the fact that that the information above on your After Visit Summary has been reviewed and is understood.  Full responsibility of the confidentiality of this discharge information lies with you and/or your care-partner.  Please read over handouts: polyps, hemorrhoids, reflux, esophagitis, hiatal hernia  You will receive a letter to set up appointment for next endoscopy  Please continue medications- Pantoprazole 40 mg twice daily- I tablet 30 minutes before breakfast and dinner; Ranitidine 150 mg at bedtime; Carafate 1 gram liquid before meals and at bedtime  Your prescriptions were sent to [facility]

## 2016-02-28 NOTE — Progress Notes (Signed)
Called to room to assist during endoscopic procedure.  Patient ID and intended procedure confirmed with present staff. Received instructions for my participation in the procedure from the performing physician.  

## 2016-02-28 NOTE — Progress Notes (Signed)
Report to PACU, RN, vss, BBS= Clear.  

## 2016-03-02 ENCOUNTER — Telehealth: Payer: Self-pay

## 2016-03-02 MED FILL — SUCRALFATE 1 GM TABLET: 1 | 30 days supply | Qty: 120 | Fill #0

## 2016-03-02 NOTE — Telephone Encounter (Signed)
  Follow up Call-  Call back number 02/28/2016  Post procedure Call Back phone  # [phone]  Permission to leave phone message Yes  Some recent data might be hidden     Patient questions:  Do you have a fever, pain , or abdominal swelling? No. Pain Score  0 *  Have you tolerated food without any problems? Yes.    Have you been able to return to your normal activities? Yes.    Do you have any questions about your discharge instructions: Diet   No. Medications  No. Follow up visit  No.  Do you have questions or concerns about your Care? No.  Actions: * If pain score is 4 or above: No action needed, pain <4.

## 2016-03-05 ENCOUNTER — Encounter: Payer: Self-pay | Admitting: Internal Medicine

## 2016-03-12 ENCOUNTER — Other Ambulatory Visit: Payer: Self-pay | Admitting: Family Medicine

## 2016-03-12 MED ORDER — CLONAZEPAM 0.5 MG PO TBDP: ORAL_TABLET | ORAL | 0 refills | Status: DC

## 2016-03-12 MED ORDER — ZOLPIDEM TARTRATE ER 12.5 MG PO TBCR: 12.5000 mg | EXTENDED_RELEASE_TABLET | Freq: Every evening | ORAL | 0 refills | Status: DC | PRN

## 2016-03-12 MED FILL — ZOLPIDEM TART ER 12.5 MG TA: 12.5 | 30 days supply | Qty: 30 | Fill #0

## 2016-03-12 MED FILL — clonazePAM 0.5 MG TBDP: 0.5 | 30 days supply | Qty: 60 | Fill #0

## 2016-03-12 NOTE — Telephone Encounter (Signed)
Last f/u 10/2015

## 2016-03-12 NOTE — Telephone Encounter (Signed)
Rx called in to requested pharmacy 

## 2016-03-19 ENCOUNTER — Encounter: Payer: Self-pay | Admitting: Internal Medicine

## 2016-03-19 ENCOUNTER — Ambulatory Visit (INDEPENDENT_AMBULATORY_CARE_PROVIDER_SITE_OTHER): Payer: 59 | Admitting: Internal Medicine

## 2016-03-19 VITALS — BP 128/78 | HR 105 | Temp 98.0°F | Wt 226.0 lb

## 2016-03-19 DIAGNOSIS — R202 Paresthesia of skin: Secondary | ICD-10-CM | POA: Diagnosis not present

## 2016-03-19 DIAGNOSIS — M2550 Pain in unspecified joint: Secondary | ICD-10-CM

## 2016-03-19 DIAGNOSIS — R768 Other specified abnormal immunological findings in serum: Secondary | ICD-10-CM

## 2016-03-19 LAB — COMPREHENSIVE METABOLIC PANEL
ALBUMIN: 4.2 g/dL (ref 3.5–5.2)
ALK PHOS: 43 U/L (ref 39–117)
ALT: 22 U/L (ref 0–53)
AST: 23 U/L (ref 0–37)
BILIRUBIN TOTAL: 0.4 mg/dL (ref 0.2–1.2)
BUN: 17 mg/dL (ref 6–23)
CALCIUM: 9.4 mg/dL (ref 8.4–10.5)
CO2: 29 mEq/L (ref 19–32)
Chloride: 104 mEq/L (ref 96–112)
Creatinine, Ser: 1.03 mg/dL (ref 0.40–1.50)
GFR: 82.7 mL/min (ref >60.00)
GLUCOSE: 90 mg/dL (ref 70–99)
POTASSIUM: 4.4 meq/L (ref 3.5–5.1)
Sodium: 137 mEq/L (ref 135–145)
TOTAL PROTEIN: 7 g/dL (ref 6.0–8.3)

## 2016-03-19 LAB — CBC
HEMATOCRIT: 41 % (ref 39.0–52.0)
Hemoglobin: 13.8 g/dL (ref 13.0–17.0)
MCHC: 33.8 g/dL (ref 30.0–36.0)
MCV: 90.9 fl (ref 78.0–100.0)
PLATELETS: 321 10*3/uL (ref 150.0–400.0)
RBC: 4.52 Mil/uL (ref 4.22–5.81)
RDW: 13.6 % (ref 11.5–15.5)
WBC: 10.1 10*3/uL (ref 4.0–10.5)

## 2016-03-19 LAB — TSH: TSH: 0.59 u[IU]/mL (ref 0.35–4.50)

## 2016-03-19 LAB — SEDIMENTATION RATE: Sed Rate: 7 mm/hr (ref 0–15)

## 2016-03-19 LAB — VITAMIN D 25 HYDROXY (VIT D DEFICIENCY, FRACTURES): VITD: 9.8 ng/mL — AB (ref 30.00–100.00)

## 2016-03-19 LAB — FOLATE: FOLATE: 14.5 ng/mL (ref >5.9)

## 2016-03-19 LAB — VITAMIN B12: Vitamin B-12: 668 pg/mL (ref 211–911)

## 2016-03-19 NOTE — Progress Notes (Signed)
Subjective:    Patient ID: [name], male    DOB: 21-Nov-1970, 46 y.o.   MRN: [id number]  HPI  Pt presents to the clinic today to follow up pain and numbness in his fingers. He was seen 01/2016 for the same. Exam was negative for carpal tunnel. His symptoms seemed more consistent with arthritic pain. He was advised to start taking Aleve daily. Since that time, he feels like the pain has gotten worse. He has difficulty making a fist. He now reports pain in his elbows, especially with bending.  Review of Systems      Past Medical History:  Diagnosis Date  . Anxiety    takes Xanax daily prn  . Constipation    related to pain meds  . Diabetes mellitus without complication ([place])   . Dizziness    occasionally  . External hemorrhoid, bleeding   . Gastric ulcer    history of  . GERD (gastroesophageal reflux disease)    takes Protonix bid  . Hiatal hernia   . History of blood clots 2012   in abdomen  . History of blood transfusion 2012  . Joint pain    knees  . Nocturia    depends on amount of fluid he drinks  . Primary localized osteoarthritis of left hip 04/24/2014  . Upper GI bleed   . Ventral hernia     Current Outpatient Prescriptions  Medication Sig Dispense Refill  . albuterol (PROVENTIL HFA;VENTOLIN HFA) 108 (90 Base) MCG/ACT inhaler Inhale 2 puffs into the lungs every 6 (six) hours as needed for wheezing or shortness of breath. 1 Inhaler 0  . busPIRone (BUSPAR) 30 MG tablet Take 1 tablet (30 mg total) by mouth 2 (two) times daily. 180 tablet 1  . clonazePAM (KLONOPIN) 0.5 MG disintegrating tablet TAKE 1 TABLET BY MOUTH TWICE A DAY AS NEEDED FOR ANXIETY 60 tablet 0  . HYDROcodone-acetaminophen (NORCO/VICODIN) 5-325 MG tablet Take 1 tablet by mouth every 6 (six) hours as needed for moderate pain. 30 tablet 0  . pantoprazole (PROTONIX) 40 MG tablet Take 1 tablet (40 mg total) by mouth 2 (two) times daily. 180 tablet 2  . ranitidine (ZANTAC) 150 MG tablet Take 1 tablet  (150 mg total) by mouth at bedtime. 90 tablet 3  . sucralfate (CARAFATE) 1 GM/10ML suspension Take 10 mLs (1 g total) by mouth 4 (four) times daily -  with meals and at bedtime. 420 mL 5  . zolpidem (AMBIEN CR) 12.5 MG CR tablet Take 1 tablet (12.5 mg total) by mouth at bedtime as needed for sleep. 30 tablet 0   Current Facility-Administered Medications  Medication Dose Route Frequency Provider Last Rate Last Dose  . 0.9 %  sodium chloride infusion  500 mL Intravenous Continuous Jerene Bears, MD        Allergies  Allergen Reactions  . Dilaudid [Hydromorphone Hcl] Rash and Other (See Comments)    [place]  . Metoclopramide Hcl Hives, Swelling and Rash    Swelling in lips    Family History  Problem Relation Age of Onset  . Diabetes Mother   . Liver disease Mother   . Diabetes Father   . Heart disease Father   . Colon cancer Neg Hx   . Stomach cancer Neg Hx   . Rectal cancer Neg Hx   . Esophageal cancer Neg Hx     Social History   Social History  . Marital status: Single    Spouse name: N/A  .  Number of children: 1  . 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 Smoking status: Never Smoker  . Smokeless tobacco: Current User    Types: Chew     Comment: occ-Chew  . Alcohol use 0.0 oz/week     Comment: 09/04/11 "very seldom"  . Drug use: No  . Sexual activity: Yes   Other Topics Concern  . Not on file   Social History Narrative  . No narrative on file     Constitutional: Denies fever, malaise, fatigue, headache or abrupt weight changes.  Musculoskeletal: Pt reports joint pain in hands and elbows. Denies difficulty with gait, muscle pain or joint swelling.  Neurological: Pt reports numbness in hands. Denies dizziness, difficulty with memory, difficulty with speech or problems with balance and coordination.    No other specific complaints in a complete review of systems (except as listed in HPI  above).  Objective:   Physical Exam   There were no vitals taken for this visit. Wt Readings from Last 3 Encounters:  02/28/16 227 lb (103 kg)  01/20/16 221 lb (100.2 kg)  01/07/16 227 lb (103 kg)    General: Appears his stated age, in NAD. Heart: Radial pulses 2+ bilaterally. Cap refill < 3 secs. Musculoskeletal: Normal flexion and extension of the fingers. Enlargement of some of the PIP's bilaterally. Hand grip equal. Normal flexion, extension and rotation of bilateral elbows. Strength 5/5 BUE.  Neurological: Alert and oriented. Sensation intact but decreased in fingertips.   BMET    Component Value Date/Time   NA 139 12/18/2015 1552   K 4.7 12/18/2015 1552   CL 101 12/18/2015 1552   CO2 29 12/18/2015 1552   GLUCOSE 101 (H) 12/18/2015 1552   BUN 12 12/18/2015 1552   CREATININE 1.13 12/18/2015 1552   CALCIUM 9.9 12/18/2015 1552   GFRNONAA >90 04/26/2014 0555   GFRAA >90 04/26/2014 0555    Lipid Panel     Component Value Date/Time   CHOL 148 07/12/2013 0828   TRIG 134.0 07/12/2013 0828   HDL 49.90 07/12/2013 0828   CHOLHDL 3 07/12/2013 0828   VLDL 26.8 07/12/2013 0828   LDLCALC 71 07/12/2013 0828    CBC    Component Value Date/Time   WBC 22.9 Repeated and verified X2. (HH) 12/18/2015 1552   RBC 4.88 12/18/2015 1552   HGB 14.9 12/18/2015 1552   HCT 44.0 12/18/2015 1552   PLT 343.0 12/18/2015 1552   MCV 90.2 12/18/2015 1552   MCH 30.7 04/27/2014 0512   MCHC 34.0 12/18/2015 1552   RDW 13.6 12/18/2015 1552   LYMPHSABS 2.2 07/24/2013 1051   MONOABS 1.3 (H) 07/24/2013 1051   EOSABS 0.2 07/24/2013 1051   BASOSABS 0.1 07/24/2013 1051    Hgb A1C Lab Results  Component Value Date   HGBA1C 5.9 07/22/2015           Assessment & Plan:   Multiple joint pains with paresthesia of hands:  Will check CBC, CMET, TSH, B12, Vit D, Folate, ESR, RF and ANA Advised him to continue Aleve Can alternate with Tylenol Arthritis He is asking for something stronger,  declines giving him anything stronger at this time  Will follow up after labs, RTC as needed Webb Silversmith, NP

## 2016-03-19 NOTE — Patient Instructions (Signed)
Joint Pain Introduction Joint pain can be caused by many things. The joint can be bruised, infected, weak from aging, or sore from exercise. The pain will probably go away if you follow your doctor's instructions for home care. If your joint pain continues, more tests may be needed to help find the cause of your condition. Follow these instructions at home: Watch your condition for any changes. Follow these instructions as told to lessen the pain that you are feeling:  Take medicines only as told by your doctor.  Rest the sore joint for as long as told by your doctor. If your doctor tells you to, raise (elevate) the painful joint above the level of your heart while you are sitting or lying down.  Do not do things that cause pain or make the pain worse.  If told, put ice on the painful area:  Put ice in a plastic bag.  Place a towel between your skin and the bag.  Leave the ice on for 20 minutes, 2-3 times per day.  Wear an elastic bandage, splint, or sling as told by your doctor. Loosen the bandage or splint if your fingers or toes lose feeling (become numb) and tingle, or if they turn cold and blue.  Begin exercising or stretching the joint as told by your doctor. Ask your doctor what types of exercise are safe for you.  Keep all follow-up visits as told by your doctor. This is important. Contact a doctor if:  Your pain gets worse and medicine does not help it.  Your joint pain does not get better in 3 days.  You have more bruising or swelling.  You have a fever.  You lose 10 pounds (4.5 kg) or more without trying. Get help right away if:  You are not able to move the joint.  Your fingers or toes become numb or they turn cold and blue. This information is not intended to replace advice given to you by your health care provider. Make sure you discuss any questions you have with your health care provider. Document Released: 01/07/2009 Document Revised: 06/27/2015 Document  Reviewed: 10/31/2013  2017 Elsevier

## 2016-03-20 LAB — RHEUMATOID FACTOR: Rhuematoid fact SerPl-aCnc: <14 IU/mL (ref <14)

## 2016-03-20 LAB — ANTI-NUCLEAR AB-TITER (ANA TITER)

## 2016-03-20 LAB — ANA: Anti Nuclear Antibody(ANA): POSITIVE — AB

## 2016-03-24 ENCOUNTER — Encounter: Payer: Self-pay | Admitting: Internal Medicine

## 2016-03-24 NOTE — Addendum Note (Signed)
Addended by: [name] on: 03/24/2016 03:20 PM   Modules accepted: Orders

## 2016-03-25 ENCOUNTER — Encounter: Payer: Self-pay | Admitting: Internal Medicine

## 2016-03-25 MED ORDER — VITAMIN D (ERGOCALCIFEROL) 1.25 MG (50000 UNIT) PO CAPS: 50000.0000 [IU] | ORAL_CAPSULE | ORAL | 0 refills | Status: DC

## 2016-03-25 MED FILL — VIT D2 1.25 MG (50,000 UNIT: 1.25 MG | 84 days supply | Qty: 12 | Fill #0

## 2016-03-25 NOTE — Telephone Encounter (Signed)
[facility] outpt pharmacy left v/m requesting vit d to be sent over. Per lab result note sent ergocalciferol 50000 units 1 x a week for 12 weeks. Done.

## 2016-04-07 ENCOUNTER — Other Ambulatory Visit: Payer: Self-pay | Admitting: Family Medicine

## 2016-04-07 MED ORDER — ZOLPIDEM TARTRATE ER 12.5 MG PO TBCR: 12.5000 mg | EXTENDED_RELEASE_TABLET | Freq: Every evening | ORAL | 0 refills | Status: DC | PRN

## 2016-04-07 MED ORDER — CLONAZEPAM 0.5 MG PO TBDP: ORAL_TABLET | ORAL | 0 refills | Status: DC

## 2016-04-07 MED FILL — ZOLPIDEM TART ER 12.5 MG TA: 12.5 | 30 days supply | Qty: 30 | Fill #0

## 2016-04-07 MED FILL — clonazePAM 0.5 MG TABS: 0.5 | 30 days supply | Qty: 60 | Fill #0 | Status: TO

## 2016-04-07 NOTE — Telephone Encounter (Signed)
Left refills on voice mail at pharmacy  

## 2016-04-07 NOTE — Telephone Encounter (Signed)
Both meds last filled 03-12-16  Last OV 03-19-16 Acute Next OV 06-22-16

## 2016-04-14 DIAGNOSIS — E669 Obesity, unspecified: Secondary | ICD-10-CM | POA: Diagnosis not present

## 2016-04-14 DIAGNOSIS — M7989 Other specified soft tissue disorders: Secondary | ICD-10-CM | POA: Diagnosis not present

## 2016-04-14 DIAGNOSIS — R768 Other specified abnormal immunological findings in serum: Secondary | ICD-10-CM | POA: Diagnosis not present

## 2016-04-14 DIAGNOSIS — M2550 Pain in unspecified joint: Secondary | ICD-10-CM | POA: Diagnosis not present

## 2016-04-14 DIAGNOSIS — Z6833 Body mass index (BMI) 33.0-33.9, adult: Secondary | ICD-10-CM | POA: Diagnosis not present

## 2016-04-14 DIAGNOSIS — R202 Paresthesia of skin: Secondary | ICD-10-CM | POA: Diagnosis not present

## 2016-04-20 DIAGNOSIS — H524 Presbyopia: Secondary | ICD-10-CM | POA: Diagnosis not present

## 2016-04-23 MED FILL — PANTOPRAZOLE SOD DR 40 MG T: 40 | 90 days supply | Qty: 180 | Fill #1

## 2016-05-07 ENCOUNTER — Encounter: Payer: Self-pay | Admitting: Family Medicine

## 2016-05-07 MED ORDER — CLONAZEPAM 0.5 MG PO TBDP: ORAL_TABLET | ORAL | 0 refills | Status: DC

## 2016-05-07 MED ORDER — ZOLPIDEM TARTRATE ER 12.5 MG PO TBCR: 12.5000 mg | EXTENDED_RELEASE_TABLET | Freq: Every evening | ORAL | 0 refills | Status: DC | PRN

## 2016-05-07 NOTE — Telephone Encounter (Signed)
Both last filled 04-07-16. Last OV 03-19-16 Next OV 06-22-16

## 2016-05-07 NOTE — Telephone Encounter (Signed)
Left refills on voice mail at pharmacy  

## 2016-05-08 ENCOUNTER — Other Ambulatory Visit: Payer: Self-pay | Admitting: Family Medicine

## 2016-05-08 MED FILL — ZOLPIDEM TART ER 12.5 MG TA: 12.5 | 30 days supply | Qty: 30 | Fill #0

## 2016-05-08 MED FILL — clonazePAM 0.5 MG TABS: 0.5 | 30 days supply | Qty: 60 | Fill #0

## 2016-06-04 ENCOUNTER — Other Ambulatory Visit: Payer: Self-pay | Admitting: Family Medicine

## 2016-06-04 MED ORDER — CLONAZEPAM 0.5 MG PO TBDP: ORAL_TABLET | ORAL | 0 refills | Status: DC

## 2016-06-04 MED ORDER — ZOLPIDEM TARTRATE ER 12.5 MG PO TBCR: 12.5000 mg | EXTENDED_RELEASE_TABLET | Freq: Every evening | ORAL | 0 refills | Status: DC | PRN

## 2016-06-04 NOTE — Telephone Encounter (Signed)
Both last filled 05-07-16  Last OV 03-19-16 No Future OV

## 2016-06-04 NOTE — Telephone Encounter (Signed)
Left refills on voice mail at pharmacy  

## 2016-06-05 MED FILL — ZOLPIDEM TART ER 12.5 MG TA: 12.5 | 30 days supply | Qty: 30 | Fill #0

## 2016-06-05 MED FILL — clonazePAM 0.5 MG TABS: 0.5 | 30 days supply | Qty: 60 | Fill #0

## 2016-06-22 ENCOUNTER — Other Ambulatory Visit (INDEPENDENT_AMBULATORY_CARE_PROVIDER_SITE_OTHER): Payer: 59

## 2016-06-22 DIAGNOSIS — E559 Vitamin D deficiency, unspecified: Secondary | ICD-10-CM

## 2016-06-22 LAB — VITAMIN D 25 HYDROXY (VIT D DEFICIENCY, FRACTURES): VITD: 24.07 ng/mL — ABNORMAL LOW (ref 30.00–100.00)

## 2016-06-23 ENCOUNTER — Telehealth: Payer: 59 | Admitting: Nurse Practitioner

## 2016-06-23 DIAGNOSIS — J0100 Acute maxillary sinusitis, unspecified: Secondary | ICD-10-CM | POA: Diagnosis not present

## 2016-06-23 MED ORDER — AZITHROMYCIN 250 MG PO TABS: ORAL_TABLET | ORAL | 0 refills | Status: DC

## 2016-06-23 NOTE — Progress Notes (Signed)

## 2016-06-24 ENCOUNTER — Other Ambulatory Visit: Payer: Self-pay | Admitting: Family Medicine

## 2016-06-24 MED ORDER — VITAMIN D (ERGOCALCIFEROL) 1.25 MG (50000 UNIT) PO CAPS: 50000.0000 [IU] | ORAL_CAPSULE | ORAL | 0 refills | Status: DC

## 2016-06-25 MED FILL — VIT D2 1.25 MG (50,000 UNIT: 1.25 MG | 84 days supply | Qty: 12 | Fill #0

## 2016-07-02 ENCOUNTER — Other Ambulatory Visit: Payer: Self-pay | Admitting: Family Medicine

## 2016-07-02 MED ORDER — ZOLPIDEM TARTRATE ER 12.5 MG PO TBCR: 12.5000 mg | EXTENDED_RELEASE_TABLET | Freq: Every evening | ORAL | 0 refills | Status: DC | PRN

## 2016-07-02 MED ORDER — CLONAZEPAM 0.5 MG PO TBDP: ORAL_TABLET | ORAL | 0 refills | Status: DC

## 2016-07-02 MED FILL — ZOLPIDEM TART ER 12.5 MG TA: 12.5 | 30 days supply | Qty: 30 | Fill #0

## 2016-07-02 MED FILL — clonazePAM 0.5 MG TBDP: 0.5 | 30 days supply | Qty: 60 | Fill #0

## 2016-07-02 NOTE — Telephone Encounter (Signed)
KLONOPIN last filled on 06/04/16 #60 AMBIEN filled on 06/04/16 #30 Last OV 03/19/16

## 2016-07-02 NOTE — Telephone Encounter (Signed)
Called in to  [place] [place] [facility] [address]  [place] [address]  Phone: [phone] Fax: [phone]

## 2016-07-21 MED FILL — PANTOPRAZOLE SOD DR 40 MG T: 40 | 90 days supply | Qty: 180 | Fill #2

## 2016-07-29 ENCOUNTER — Encounter: Payer: Self-pay | Admitting: Internal Medicine

## 2016-07-30 ENCOUNTER — Other Ambulatory Visit: Payer: Self-pay | Admitting: Family Medicine

## 2016-07-30 MED ORDER — ZOLPIDEM TARTRATE ER 12.5 MG PO TBCR: 12.5000 mg | EXTENDED_RELEASE_TABLET | Freq: Every evening | ORAL | 0 refills | Status: DC | PRN

## 2016-07-30 MED ORDER — CLONAZEPAM 0.5 MG PO TBDP: ORAL_TABLET | ORAL | 0 refills | Status: DC

## 2016-07-30 MED FILL — clonazePAM 0.5 MG TBDP: 0.5 | 30 days supply | Qty: 60 | Fill #0

## 2016-07-30 MED FILL — ZOLPIDEM TART ER 12.5 MG TA: 12.5 | 30 days supply | Qty: 30 | Fill #0

## 2016-07-30 NOTE — Telephone Encounter (Signed)
CALLED IN TO [place], [place]: [phone]

## 2016-07-30 NOTE — Telephone Encounter (Signed)
Both meds were filled on 07/02/16 Last OV 03/19/16

## 2016-07-31 ENCOUNTER — Other Ambulatory Visit: Payer: Self-pay

## 2016-07-31 ENCOUNTER — Encounter: Payer: Self-pay | Admitting: Family Medicine

## 2016-08-07 ENCOUNTER — Encounter: Payer: Self-pay | Admitting: Internal Medicine

## 2016-08-20 ENCOUNTER — Ambulatory Visit (AMBULATORY_SURGERY_CENTER): Payer: Self-pay | Admitting: *Deleted

## 2016-08-20 VITALS — Ht 69.0 in | Wt 221.0 lb

## 2016-08-20 DIAGNOSIS — K209 Esophagitis, unspecified without bleeding: Secondary | ICD-10-CM

## 2016-08-20 NOTE — Progress Notes (Signed)
No egg or soy allergy known to patient  No issues with past sedation with any surgeries  or procedures, no intubation problems  No diet pills per patient No home 02 use per patient  No blood thinners per patient  Pt denies issues with constipation  No A fib or A flutter  EMMI video sent to pt's e mail -pt declined - has had many

## 2016-08-27 ENCOUNTER — Encounter: Payer: Self-pay | Admitting: Family Medicine

## 2016-08-27 ENCOUNTER — Other Ambulatory Visit: Payer: Self-pay | Admitting: Family Medicine

## 2016-08-27 MED ORDER — ZOLPIDEM TARTRATE ER 12.5 MG PO TBCR: 12.5000 mg | EXTENDED_RELEASE_TABLET | Freq: Every evening | ORAL | 2 refills | Status: DC | PRN

## 2016-08-27 MED ORDER — CLONAZEPAM 0.5 MG PO TBDP: ORAL_TABLET | ORAL | 2 refills | Status: DC

## 2016-08-27 MED FILL — clonazePAM 0.5 MG TBDP: 0.5 | 30 days supply | Qty: 60 | Fill #0

## 2016-08-27 MED FILL — ZOLPIDEM TART ER 12.5 MG TA: 12.5 | 30 days supply | Qty: 30 | Fill #0

## 2016-08-27 MED FILL — SUCRALFATE 1 GM TABLET: 1 | 30 days supply | Qty: 120 | Fill #1

## 2016-08-27 NOTE — Telephone Encounter (Signed)
[name] at [facility] left v/m requesting verification of # of refills on clonazepam and zolpidem.  I called back [facility] outpt pharmacy and advised 2 refills on each med. Nothing further needed.

## 2016-08-27 NOTE — Telephone Encounter (Signed)
Called in to [place], [place]: [phone]

## 2016-08-27 NOTE — Telephone Encounter (Signed)
Last refill 07/30/16 Last OV 03/19/16 acute visit with [facility].  Ok to refill?

## 2016-09-01 ENCOUNTER — Encounter: Payer: Self-pay | Admitting: Internal Medicine

## 2016-09-03 ENCOUNTER — Encounter: Payer: 59 | Admitting: Internal Medicine

## 2016-09-15 ENCOUNTER — Ambulatory Visit (AMBULATORY_SURGERY_CENTER): Payer: 59 | Admitting: Internal Medicine

## 2016-09-15 ENCOUNTER — Encounter: Payer: Self-pay | Admitting: Internal Medicine

## 2016-09-15 ENCOUNTER — Telehealth: Payer: Self-pay

## 2016-09-15 VITALS — BP 116/71 | HR 69 | Temp 98.0°F | Resp 13 | Ht 69.0 in | Wt 221.0 lb

## 2016-09-15 DIAGNOSIS — G4733 Obstructive sleep apnea (adult) (pediatric): Secondary | ICD-10-CM | POA: Diagnosis not present

## 2016-09-15 DIAGNOSIS — K209 Esophagitis, unspecified without bleeding: Secondary | ICD-10-CM

## 2016-09-15 DIAGNOSIS — K219 Gastro-esophageal reflux disease without esophagitis: Secondary | ICD-10-CM | POA: Diagnosis not present

## 2016-09-15 DIAGNOSIS — K2210 Ulcer of esophagus without bleeding: Secondary | ICD-10-CM

## 2016-09-15 DIAGNOSIS — K2950 Unspecified chronic gastritis without bleeding: Secondary | ICD-10-CM | POA: Diagnosis not present

## 2016-09-15 DIAGNOSIS — K449 Diaphragmatic hernia without obstruction or gangrene: Secondary | ICD-10-CM

## 2016-09-15 DIAGNOSIS — K2970 Gastritis, unspecified, without bleeding: Secondary | ICD-10-CM

## 2016-09-15 MED ORDER — SODIUM CHLORIDE 0.9 % IV SOLN: 500.0000 mL | INTRAVENOUS | Status: DC

## 2016-09-15 NOTE — Progress Notes (Signed)
Called to room to assist during endoscopic procedure.  Patient ID and intended procedure confirmed with present staff. Received instructions for my participation in the procedure from the performing physician.  

## 2016-09-15 NOTE — Telephone Encounter (Signed)
Referral faxed to [facility] for pt to be scheduled for appt with Dr. [name].

## 2016-09-15 NOTE — Progress Notes (Signed)
Pt's states no medical or surgical changes since previsit or office visit. 

## 2016-09-15 NOTE — Telephone Encounter (Signed)
-----   Message from Jerene Bears, MD sent at 09/15/2016  2:49 PM EDT ----- Regarding: [facility] Needs referral to [facility] to consider hiatal hernia repair, persistent GERD and esophagitis Thanks JMP

## 2016-09-15 NOTE — Op Note (Signed)
[facility] Patient Name: [name] Procedure Date: 09/15/2016 10:09 AM MRN: [id number] Endoscopist: Jerene Bears , MD Age: 46 Referring MD:  Date of Birth: Jun 18, 1970 Gender: Male Account #: [id number] Procedure:                Upper GI endoscopy Indications:              Follow-up of reflux esophagitis Medicines:                Monitored Anesthesia Care Procedure:                Pre-Anesthesia Assessment:                           - Prior to the procedure, a History and Physical                            was performed, and patient medications and                            allergies were reviewed. The patient's tolerance of                            previous anesthesia was also reviewed. The risks                            and benefits of the procedure and the sedation                            options and risks were discussed with the patient.                            All questions were answered, and informed consent                            was obtained. Prior Anticoagulants: The patient has                            taken no previous anticoagulant or antiplatelet                            agents. ASA Grade Assessment: II - A patient with                            mild systemic disease. After reviewing the risks                            and benefits, the patient was deemed in                            satisfactory condition to undergo the procedure.                           After obtaining informed consent, the endoscope was  passed under direct vision. Throughout the                            procedure, the patient's blood pressure, pulse, and                            oxygen saturations were monitored continuously. The                            Endoscope was introduced through the mouth, and                            advanced to the second part of duodenum. The upper                            GI endoscopy was accomplished  without difficulty.                            The patient tolerated the procedure well. Scope In: Scope Out: Findings:                 LA Grade D (one or more mucosal breaks involving at                            least 75% of esophageal circumference) esophagitis                            with no bleeding was found 32 to 35 cm from the                            incisors. This is ulcerative at GE junction. This                            is slightly improved from Jan 2018. The area was                            examined under narrow-band imaging, but due to                            inflammation it is difficult to tell if there are                            changes of Barrett's esophagus. Multiple targeted                            biopsies were taken with a cold forceps for                            histology.                           A large hiatal hernia was found. The proximal  extent of the gastric folds (end of tubular                            esophagus) was 34 cm from the incisors. The hiatal                            narrowing was 40 cm from the incisors.                           Localized mild striped inflammation was found in                            the gastric antrum. Biopsies were taken with a cold                            forceps for histology and Helicobacter pylori                            testing (body, antrum, incisura).                           The examined duodenum was normal. Complications:            No immediate complications. Estimated Blood Loss:     Estimated blood loss was minimal. Impression:               - LA Grade D acute and erosive esophagitis.                            Biopsied.                           - 6 cm hiatal hernia.                           - Mild gastritis. Biopsied.                           - Normal examined duodenum. Recommendation:           - Patient has a contact number available for                             emergencies. The signs and symptoms of potential                            delayed complications were discussed with the                            patient. Return to normal activities tomorrow.                            Written discharge instructions were provided to the                            patient.                           -  Resume previous diet.                           - Continue present medications.                           - Await pathology results.                           - Consideration should be given to repair of hiatal                            hernia given persistent severe esophagitis and                            reflux symptoms. Jerene Bears, MD 09/15/2016 10:33:21 AM This report has been signed electronically.

## 2016-09-15 NOTE — Progress Notes (Signed)
Report to PACU, RN, vss, BBS= Clear.  

## 2016-09-15 NOTE — Patient Instructions (Signed)
Await pathology results.  Handouts given: Esophagitis.   YOU HAD AN ENDOSCOPIC PROCEDURE TODAY AT [facility]:   Refer to the procedure report that was given to you for any specific questions about what was found during the examination.  If the procedure report does not answer your questions, please call your gastroenterologist to clarify.  If you requested that your care partner not be given the details of your procedure findings, then the procedure report has been included in a sealed envelope for you to review at your convenience later.  YOU SHOULD EXPECT: Some feelings of bloating in the abdomen. Passage of more gas than usual.  Walking can help get rid of the air that was put into your GI tract during the procedure and reduce the bloating. If you had a lower endoscopy (such as a colonoscopy or flexible sigmoidoscopy) you may notice spotting of blood in your stool or on the toilet paper. If you underwent a bowel prep for your procedure, you may not have a normal bowel movement for a few days.  Please Note:  You might notice some irritation and congestion in your nose or some drainage.  This is from the oxygen used during your procedure.  There is no need for concern and it should clear up in a day or so.  SYMPTOMS TO REPORT IMMEDIATELY:   Following upper endoscopy (EGD)  Vomiting of blood or coffee ground material  New chest pain or pain under the shoulder blades  Painful or persistently difficult swallowing  New shortness of breath  Fever of 100F or higher  Black, tarry-looking stools  For urgent or emergent issues, a gastroenterologist can be reached at any hour by calling [phone].   DIET:  We do recommend a small meal at first, but then you may proceed to your regular diet.  Drink plenty of fluids but you should avoid alcoholic beverages for 24 hours.  ACTIVITY:  You should plan to take it easy for the rest of today and you should NOT DRIVE or use heavy  machinery until tomorrow (because of the sedation medicines used during the test).    FOLLOW UP: Our staff will call the number listed on your records the next business day following your procedure to check on you and address any questions or concerns that you may have regarding the information given to you following your procedure. If we do not reach you, we will leave a message.  However, if you are feeling well and you are not experiencing any problems, there is no need to return our call.  We will assume that you have returned to your regular daily activities without incident.  If any biopsies were taken you will be contacted by phone or by letter within the next 1-3 weeks.  Please call us at [phone] if you have not heard about the biopsies in 3 weeks.    SIGNATURES/CONFIDENTIALITY: You and/or your care partner have signed paperwork which will be entered into your electronic medical record.  These signatures attest to the fact that that the information above on your After Visit Summary has been reviewed and is understood.  Full responsibility of the confidentiality of this discharge information lies with you and/or your care-partner.

## 2016-09-16 ENCOUNTER — Telehealth: Payer: Self-pay

## 2016-09-16 ENCOUNTER — Telehealth: Payer: Self-pay | Admitting: *Deleted

## 2016-09-16 NOTE — Telephone Encounter (Signed)
-----   Message from Jerene Bears, MD sent at 09/15/2016  2:50 PM EDT ----- Regarding: [facility] Banding visits JMP

## 2016-09-16 NOTE — Telephone Encounter (Signed)
Patient has been scheduled for first available hemorrhoidal banding which is 11/11/16 @ 3:30 pm. Unfortunately, no additional banding slots available at this time. He is advised that we will schedule additional bandings when spots become available. He verbalizes understanding.

## 2016-09-16 NOTE — Telephone Encounter (Signed)
  Follow up Call-  Call back number 09/15/2016 02/28/2016  Post procedure Call Back phone  # [phone]  Permission to leave phone message Yes Yes  Some recent data might be hidden     Patient questions:  Do you have a fever, pain , or abdominal swelling? No. Pain Score  0 *  Have you tolerated food without any problems? Yes.    Have you been able to return to your normal activities? Yes.    Do you have any questions about your discharge instructions: Diet   No. Medications  No. Follow up visit  No.  Do you have questions or concerns about your Care? No.  Actions: * If pain score is 4 or above: No action needed, pain <4.

## 2016-09-18 ENCOUNTER — Telehealth: Payer: Self-pay

## 2016-09-18 ENCOUNTER — Encounter: Payer: Self-pay | Admitting: Internal Medicine

## 2016-09-18 NOTE — Telephone Encounter (Signed)
Pt has appt with Dr. [name] 10/06/16@1 :30pm, pt to arrive there at 1pm.

## 2016-09-24 ENCOUNTER — Encounter: Payer: Self-pay | Admitting: Family Medicine

## 2016-09-25 MED FILL — ZOLPIDEM TART ER 12.5 MG TA: 12.5 | 30 days supply | Qty: 30 | Fill #1

## 2016-09-25 MED FILL — clonazePAM 0.5 MG TBDP: 0.5 | 30 days supply | Qty: 60 | Fill #1

## 2016-10-06 DIAGNOSIS — K219 Gastro-esophageal reflux disease without esophagitis: Secondary | ICD-10-CM | POA: Diagnosis not present

## 2016-10-06 DIAGNOSIS — K449 Diaphragmatic hernia without obstruction or gangrene: Secondary | ICD-10-CM | POA: Diagnosis not present

## 2016-10-08 ENCOUNTER — Telehealth: Payer: Self-pay | Admitting: Internal Medicine

## 2016-10-08 ENCOUNTER — Telehealth: Payer: 59 | Admitting: Family

## 2016-10-08 DIAGNOSIS — J029 Acute pharyngitis, unspecified: Secondary | ICD-10-CM | POA: Diagnosis not present

## 2016-10-08 MED ORDER — PREDNISONE 5 MG PO TABS: 5.0000 mg | ORAL_TABLET | ORAL | 0 refills | Status: DC

## 2016-10-08 MED ORDER — BENZONATATE 100 MG PO CAPS: 100.0000 mg | ORAL_CAPSULE | Freq: Three times a day (TID) | ORAL | 0 refills | Status: DC | PRN

## 2016-10-08 MED FILL — BENZONATATE 100 MG CAP: 100 | 10 days supply | Qty: 30 | Fill #0

## 2016-10-08 MED FILL — predniSONE 5 MG (21) TBPK: 5 | 6 days supply | Qty: 21 | Fill #0

## 2016-10-08 NOTE — Telephone Encounter (Signed)
Ok thanks 

## 2016-10-08 NOTE — Progress Notes (Signed)
Thank you for the details you put in the comment boxes. Those details really help [place] take better care of you.   We are sorry that you are not feeling well.  Here is how we plan to help!  Based on your presentation I believe you most likely have A cough due to a virus.  This is called viral bronchitis and is best treated by rest, plenty of fluids and control of the cough.  You may use Ibuprofen or Tylenol as directed to help your symptoms.     In addition you may use A non-prescription cough medication called Mucinex DM: take 2 tablets every 12 hours. and A prescription cough medication called Tessalon Perles 100mg . You may take 1-2 capsules every 8 hours as needed for your cough.  Sterapred 5 mg dosepak  From your responses in the eVisit questionnaire you describe inflammation in the upper respiratory tract which is causing a significant cough.  This is commonly called Bronchitis and has four common causes:    Allergies  Viral Infections  Acid Reflux  Bacterial Infection Allergies, viruses and acid reflux are treated by controlling symptoms or eliminating the cause. An example might be a cough caused by taking certain blood pressure medications. You stop the cough by changing the medication. Another example might be a cough caused by acid reflux. Controlling the reflux helps control the cough.  USE OF BRONCHODILATOR ("RESCUE") INHALERS: There is a risk from using your bronchodilator too frequently.  The risk is that over-reliance on a medication which only relaxes the muscles surrounding the breathing tubes can reduce the effectiveness of medications prescribed to reduce swelling and congestion of the tubes themselves.  Although you feel brief relief from the bronchodilator inhaler, your asthma may actually be worsening with the tubes becoming more swollen and filled with mucus.  This can delay other crucial treatments, such as oral steroid medications. If you need to use a bronchodilator  inhaler daily, several times per day, you should discuss this with your provider.  There are probably better treatments that could be used to keep your asthma under control.     HOME CARE . Only take medications as instructed by your medical team. . Complete the entire course of an antibiotic. . Drink plenty of fluids and get plenty of rest. . Avoid close contacts especially the very young and the elderly . Cover your mouth if you cough or cough into your sleeve. . Always remember to wash your hands . A steam or ultrasonic humidifier can help congestion.   GET HELP RIGHT AWAY IF: . You develop worsening fever. . You become short of breath . You cough up blood. . Your symptoms persist after you have completed your treatment plan MAKE SURE YOU   Understand these instructions.  Will watch your condition.  Will get help right away if you are not doing well or get worse.  Your e-visit answers were reviewed by a board certified advanced clinical practitioner to complete your personal care plan.  Depending on the condition, your plan could have included both over the counter or prescription medications. If there is a problem please reply  once you have received a response from your provider. Your safety is important to [place].  If you have drug allergies check your prescription carefully.    You can use MyChart to ask questions about today's visit, request a non-urgent call back, or ask for a work or school excuse for 24 hours related to this e-Visit. If  it has been greater than 24 hours you will need to follow up with your provider, or enter a new e-Visit to address those concerns. You will get an e-mail in the next two days asking about your experience.  I hope that your e-visit has been valuable and will speed your recovery. Thank you for using e-visits.

## 2016-10-08 NOTE — Telephone Encounter (Signed)
Pt scheduled for EM at [facility] 10/19/16@9am .

## 2016-10-08 NOTE — Telephone Encounter (Signed)
Ok to proceed with EM and I will ask Dr. Silverio Decamp to read it  Thanks JMP

## 2016-10-08 NOTE — Telephone Encounter (Signed)
Dr. Hilarie Fredrickson please see note below. If ok to order EM will you read it or does Dr. Silverio Decamp need to read it. Please advise.

## 2016-10-09 ENCOUNTER — Other Ambulatory Visit: Payer: Self-pay | Admitting: General Surgery

## 2016-10-09 DIAGNOSIS — K219 Gastro-esophageal reflux disease without esophagitis: Secondary | ICD-10-CM

## 2016-10-09 DIAGNOSIS — K449 Diaphragmatic hernia without obstruction or gangrene: Secondary | ICD-10-CM

## 2016-10-09 NOTE — Telephone Encounter (Signed)
Pt aware and instructions mailed to pt.

## 2016-10-13 ENCOUNTER — Ambulatory Visit
Admission: RE | Admit: 2016-10-13 | Discharge: 2016-10-13 | Disposition: A | Discharge status: Home / Self Care | Payer: 59 | Source: Ambulatory Visit | Attending: General Surgery | Admitting: General Surgery

## 2016-10-13 DIAGNOSIS — K219 Gastro-esophageal reflux disease without esophagitis: Secondary | ICD-10-CM

## 2016-10-13 DIAGNOSIS — K449 Diaphragmatic hernia without obstruction or gangrene: Secondary | ICD-10-CM

## 2016-10-19 ENCOUNTER — Encounter (HOSPITAL_COMMUNITY)
Admission: RE | Disposition: A | Discharge status: Home / Self Care | Payer: Self-pay | Source: Ambulatory Visit | Attending: Gastroenterology

## 2016-10-19 ENCOUNTER — Ambulatory Visit (HOSPITAL_COMMUNITY)
Admission: RE | Admit: 2016-10-19 | Discharge: 2016-10-19 | Disposition: A | Discharge status: Home / Self Care | Payer: 59 | Source: Ambulatory Visit | Attending: Gastroenterology | Admitting: Gastroenterology

## 2016-10-19 DIAGNOSIS — K219 Gastro-esophageal reflux disease without esophagitis: Secondary | ICD-10-CM | POA: Insufficient documentation

## 2016-10-19 DIAGNOSIS — K440 Diaphragmatic hernia with obstruction, without gangrene: Secondary | ICD-10-CM

## 2016-10-19 DIAGNOSIS — K449 Diaphragmatic hernia without obstruction or gangrene: Secondary | ICD-10-CM | POA: Diagnosis not present

## 2016-10-19 HISTORY — PX: ESOPHAGEAL MANOMETRY: SHX5429

## 2016-10-19 SURGERY — MANOMETRY, ESOPHAGUS
Anesthesia: Topical

## 2016-10-19 MED ORDER — LIDOCAINE VISCOUS 2 % MT SOLN
OROMUCOSAL | Status: AC
  Filled 2016-10-19: qty 15

## 2016-10-19 SURGICAL SUPPLY — 2 items
FACESHIELD LNG OPTICON STERILE (SAFETY) IMPLANT
GLOVE BIO SURGEON STRL SZ8 (GLOVE) ×4 IMPLANT

## 2016-10-19 NOTE — Progress Notes (Signed)
Esophageal Manometry done per protocol. Pt tolerated well without complication.  Report to be sent to dr. Silverio Decamp

## 2016-10-20 ENCOUNTER — Encounter (HOSPITAL_COMMUNITY): Payer: Self-pay | Admitting: Gastroenterology

## 2016-10-21 ENCOUNTER — Encounter: Payer: Self-pay | Admitting: *Deleted

## 2016-10-22 DIAGNOSIS — K219 Gastro-esophageal reflux disease without esophagitis: Secondary | ICD-10-CM

## 2016-10-22 DIAGNOSIS — K440 Diaphragmatic hernia with obstruction, without gangrene: Secondary | ICD-10-CM

## 2016-10-23 ENCOUNTER — Other Ambulatory Visit: Payer: Self-pay | Admitting: Family Medicine

## 2016-10-23 MED FILL — clonazePAM 0.5 MG TBDP: 0.5 | 30 days supply | Qty: 60 | Fill #2

## 2016-10-23 MED FILL — ZOLPIDEM TART ER 12.5 MG TA: 12.5 | 30 days supply | Qty: 30 | Fill #0

## 2016-10-23 MED FILL — PANTOPRAZOLE SOD DR 40 MG T: 40 | 90 days supply | Qty: 180 | Fill #0

## 2016-10-23 NOTE — Telephone Encounter (Signed)
Left refill on voice mail at pharmacy  

## 2016-10-23 NOTE — Telephone Encounter (Signed)
Last filled 07-30-16 #30 Multiple acute OVs with [facility] in the last year. No Future OV

## 2016-10-26 ENCOUNTER — Encounter: Payer: Self-pay | Admitting: Family Medicine

## 2016-11-09 ENCOUNTER — Telehealth: Payer: 59 | Admitting: Nurse Practitioner

## 2016-11-09 DIAGNOSIS — J0100 Acute maxillary sinusitis, unspecified: Secondary | ICD-10-CM

## 2016-11-09 MED ORDER — DOXYCYCLINE HYCLATE 100 MG PO TABS: 100.0000 mg | ORAL_TABLET | Freq: Two times a day (BID) | ORAL | 0 refills | Status: DC

## 2016-11-09 MED FILL — DOXYCYCLINE HYC 100 MG TAB: 100 | 10 days supply | Qty: 20 | Fill #0

## 2016-11-09 NOTE — Progress Notes (Signed)

## 2016-11-11 ENCOUNTER — Encounter: Payer: 59 | Admitting: Internal Medicine

## 2016-11-20 ENCOUNTER — Encounter: Payer: Self-pay | Admitting: Family Medicine

## 2016-11-23 ENCOUNTER — Ambulatory Visit (INDEPENDENT_AMBULATORY_CARE_PROVIDER_SITE_OTHER): Payer: 59 | Admitting: Family Medicine

## 2016-11-23 ENCOUNTER — Encounter: Payer: Self-pay | Admitting: Family Medicine

## 2016-11-23 DIAGNOSIS — R5383 Other fatigue: Secondary | ICD-10-CM

## 2016-11-23 MED ORDER — ZOLPIDEM TARTRATE ER 12.5 MG PO TBCR
12.5000 mg | EXTENDED_RELEASE_TABLET | Freq: Every evening | ORAL | 2 refills | Status: DC | PRN
Start: 2016-11-23 — End: 2017-02-15

## 2016-11-23 MED ORDER — ZOLPIDEM TARTRATE ER 12.5 MG PO TBCR
12.5000 mg | EXTENDED_RELEASE_TABLET | Freq: Every evening | ORAL | 2 refills | Status: DC | PRN
Start: 2016-11-23 — End: 2016-11-23

## 2016-11-23 MED ORDER — CLONAZEPAM 0.5 MG PO TBDP: ORAL_TABLET | ORAL | 2 refills | Status: DC

## 2016-11-23 NOTE — Progress Notes (Signed)
Subjective:   Patient ID: [name], male    DOB: [birth year], 46 y.o.   MRN: [id number]  [name] is a pleasant 46 y.o. year old male who presents to clinic today with Follow-up (Patient is here today to F/U with low Vit-D and elevated A1C.  He states that he is tired all the time.)  on 11/23/2016  HPI:  Has felt very fatigued lately even when he sleeps well.  "just wiped out" for the past few weeks. No other symptoms.  Denies feeling depressed or anxious- "life is going really well."   Lab Results  Component Value Date   HGBA1C 5.9 07/22/2015     Current Outpatient Prescriptions on File Prior to Visit  Medication Sig Dispense Refill  . clonazePAM (KLONOPIN) 0.5 MG disintegrating tablet TAKE 1 TABLET BY MOUTH TWICE A DAY AS NEEDED FOR ANXIETY 60 tablet 2  . pantoprazole (PROTONIX) 40 MG tablet TAKE 1 TABLET BY MOUTH TWICE A DAY 180 tablet 2  . ranitidine (ZANTAC) 150 MG tablet Take 1 tablet (150 mg total) by mouth at bedtime. 90 tablet 3  . sucralfate (CARAFATE) 1 GM/10ML suspension Take 10 mLs (1 g total) by mouth 4 (four) times daily -  with meals and at bedtime. 420 mL 5  . zolpidem (AMBIEN CR) 12.5 MG CR tablet TAKE 1 TABLET BY MOUTH AT BEDTIME AS NEEDED FOR SLEEP 30 tablet 0  . busPIRone (BUSPAR) 30 MG tablet Take 1 tablet (30 mg total) by mouth 2 (two) times daily. (Patient not taking: Reported on 08/20/2016) 180 tablet 1  . HYDROcodone-acetaminophen (NORCO/VICODIN) 5-325 MG tablet Take 1 tablet by mouth every 6 (six) hours as needed for moderate pain. (Patient not taking: Reported on 11/23/2016) 30 tablet 0  . Vitamin D, Ergocalciferol, (DRISDOL) 50000 units CAPS capsule Take 1 capsule (50,000 Units total) by mouth every 7 (seven) days. For 12 weeks. (Patient not taking: Reported on 11/23/2016) 12 capsule 0   No current facility-administered medications on file prior to visit.     Allergies  Allergen Reactions  . Dilaudid [Hydromorphone Hcl] Rash and Other (See  Comments)    Shakey  . Metoclopramide Hcl Hives, Swelling and Rash    Swelling in lips    Past Medical History:  Diagnosis Date  . Anxiety    takes Xanax daily prn  . Clotting disorder ([place])   . Constipation    related to pain meds  . Diabetes mellitus without complication ([place])   . Dizziness    occasionally  . Erosive esophagitis   . External hemorrhoid, bleeding   . Gastric ulcer    history of  . GERD (gastroesophageal reflux disease)    takes Protonix bid  . Hiatal hernia   . History of blood clots 2012   in abdomen  . History of blood transfusion 2012  . Joint pain    knees  . Nocturia    depends on amount of fluid he drinks  . Primary localized osteoarthritis of left hip 04/24/2014  . Serrated polyp of colon   . Sleep apnea    no cpap  . Upper GI bleed   . Ventral hernia     Past Surgical History:  Procedure Laterality Date  . ABDOMINAL SURGERY  10/2010   d/t bleeding ulcers; "oversewing of gastric ulcer"  . COLONOSCOPY    . ESOPHAGEAL MANOMETRY N/A 10/19/2016   Procedure: ESOPHAGEAL MANOMETRY (EM);  Surgeon: Mauri Pole, MD;  Location: [facility];  Service:  Endoscopy;  Laterality: N/A;  . ESOPHAGOGASTRODUODENOSCOPY    . HERNIA REPAIR  09/04/11   incisional   . INCISIONAL HERNIA REPAIR  09/04/2011   Procedure: HERNIA REPAIR INCISIONAL;  Surgeon: Madilyn Hook, DO;  Location: [facility];  Service: General;  Laterality: N/A;  debridment calcified mass  . POLYPECTOMY    . TOTAL HIP ARTHROPLASTY Left 04/24/2014   Procedure: LEFT TOTAL HIP ARTHROPLASTY;  Surgeon: Marchia Bond, MD;  Location: [facility];  Service: Orthopedics;  Laterality: Left;  . UPPER GASTROINTESTINAL ENDOSCOPY      Family History  Problem Relation Age of Onset  . Diabetes Mother   . Liver disease Mother   . Diabetes Father   . Heart disease Father   . Colon cancer Neg Hx   . Stomach cancer Neg Hx   . Rectal cancer Neg Hx   . Esophageal cancer Neg Hx   . Colon polyps Neg Hx     Social  History   Social History  . Marital status: Single    Spouse name: N/A  . Number of children: 1  . 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 Smoking status: Never Smoker  . Smokeless tobacco: Current User    Types: Chew     Comment: occ-Chew  . Alcohol use 0.0 oz/week     Comment: 09/04/11 "very seldom"  . Drug use: No  . Sexual activity: Yes   Other Topics Concern  . Not on file   Social History Narrative  . No narrative on file   The PMH, PSH, Social History, Family History, Medications, and allergies have been reviewed in [facility], and have been updated if relevant.  Review of Systems  Constitutional: Positive for fatigue.  HENT: Negative.   Respiratory: Negative.   Cardiovascular: Negative.   Gastrointestinal: Negative.   Musculoskeletal: Negative.   Neurological: Negative.   Psychiatric/Behavioral: Negative.   All other systems reviewed and are negative.      Objective:    BP (!) 142/78 (BP Location: Right Arm, Patient Position: Sitting, Cuff Size: Normal)   Pulse 79   Temp 98.5 F (36.9 C) (Oral)   Ht 5\' 9"  (1.753 m)   Wt 223 lb 1.9 oz (101.2 kg)   SpO2 97%   BMI 32.95 kg/m    Physical Exam  General:  pleasant male in no acute distress Eyes:  PERRL Ears:  External ear exam shows no significant lesions or deformities.  TMs normal bilaterally Hearing is grossly normal bilaterally. Nose:  External nasal examination shows no deformity or inflammation. Nasal mucosa are pink and moist without lesions or exudates. Mouth:  Oral mucosa and oropharynx without lesions or exudates.  Teeth in good repair. Neck:  no carotid bruit or thyromegaly no cervical or supraclavicular lymphadenopathy  Lungs:  Normal respiratory effort, chest expands symmetrically. Lungs are clear to auscultation, no crackles or wheezes. Heart:  Normal rate and regular rhythm. S1 and S2 normal without gallop, murmur, click, rub  or other extra sounds. Pulses:  R and L posterior tibial pulses are full and equal bilaterally  Extremities:  no edema  Psych:  Good eye contact, not anxious or depressed appearing       Assessment & Plan:   Other fatigue - Plan: Hemoglobin A1c, Vitamin D, 25-hydroxy, CBC with Differential/Platelet, Comprehensive metabolic panel, TSH, Vitamin B12 No Follow-up on file.

## 2016-11-23 NOTE — Assessment & Plan Note (Signed)
Start with labs today. Has no other symptoms. The patient indicates understanding of these issues and agrees with the plan.  Orders Placed This Encounter  Procedures  . Hemoglobin A1c  . Vitamin D, 25-hydroxy  . CBC with Differential/Platelet  . Comprehensive metabolic panel  . TSH  . Vitamin B12

## 2016-11-24 LAB — VITAMIN D 25 HYDROXY (VIT D DEFICIENCY, FRACTURES): VITD: 15.99 ng/mL — AB (ref 30.00–100.00)

## 2016-11-24 LAB — CBC WITH DIFFERENTIAL/PLATELET
BASOS ABS: 0.1 10*3/uL (ref 0.0–0.1)
Basophils Relative: 1.1 % (ref 0.0–3.0)
Eosinophils Absolute: 0.2 10*3/uL (ref 0.0–0.7)
Eosinophils Relative: 2.8 % (ref 0.0–5.0)
HCT: 42.6 % (ref 39.0–52.0)
Hemoglobin: 14.1 g/dL (ref 13.0–17.0)
LYMPHS ABS: 2 10*3/uL (ref 0.7–4.0)
Lymphocytes Relative: 26.3 % (ref 12.0–46.0)
MCHC: 33.1 g/dL (ref 30.0–36.0)
MCV: 93.4 fl (ref 78.0–100.0)
MONO ABS: 0.7 10*3/uL (ref 0.1–1.0)
MONOS PCT: 9.5 % (ref 3.0–12.0)
NEUTROS ABS: 4.7 10*3/uL (ref 1.4–7.7)
NEUTROS PCT: 60.3 % (ref 43.0–77.0)
PLATELETS: 343 10*3/uL (ref 150.0–400.0)
RBC: 4.56 Mil/uL (ref 4.22–5.81)
RDW: 13.9 % (ref 11.5–15.5)
WBC: 7.8 10*3/uL (ref 4.0–10.5)

## 2016-11-24 LAB — TSH: TSH: 1.75 u[IU]/mL (ref 0.35–4.50)

## 2016-11-24 LAB — HEMOGLOBIN A1C: HEMOGLOBIN A1C: 5.8 % (ref 4.6–6.5)

## 2016-11-24 LAB — VITAMIN B12: Vitamin B-12: 992 pg/mL — ABNORMAL HIGH (ref 211–911)

## 2016-11-25 ENCOUNTER — Other Ambulatory Visit: Payer: Self-pay

## 2016-11-25 DIAGNOSIS — R7989 Other specified abnormal findings of blood chemistry: Secondary | ICD-10-CM

## 2016-11-25 MED ORDER — VITAMIN D (ERGOCALCIFEROL) 1.25 MG (50000 UNIT) PO CAPS: 50000.0000 [IU] | ORAL_CAPSULE | ORAL | 0 refills | Status: DC

## 2016-11-25 MED FILL — VIT D2 1.25 MG (50,000 UNIT: 1.25 MG | 42 days supply | Qty: 6 | Fill #0

## 2016-11-25 NOTE — Telephone Encounter (Signed)
Please call pt- his a1c, thyroid function, cbc and vit b12 look great but his vitamin [name] is very low which could certainly explain his fatigue.  Please call in Vit D3 50,000 units x 6 weeks - 1 tab po weekly x 6 weeks (number 6, no refills), then continue VitD 1600 IU daily. Recheck in 8-12 weeks (268.0)   Pt aware and Rx faxed/created future order for Vit-Devaun Hernandez with Dx: Low Vit-Thressa Shiffer/thx dmf

## 2016-11-26 ENCOUNTER — Telehealth: Payer: 59 | Admitting: Physician Assistant

## 2016-11-26 DIAGNOSIS — J019 Acute sinusitis, unspecified: Secondary | ICD-10-CM

## 2016-11-26 DIAGNOSIS — B9789 Other viral agents as the cause of diseases classified elsewhere: Secondary | ICD-10-CM

## 2016-11-26 MED ORDER — AZELASTINE HCL 0.1 % NA SOLN: 1.0000 | Freq: Two times a day (BID) | NASAL | 0 refills | Status: DC

## 2016-11-26 MED FILL — AZELASTINE 0.1% (137 MCG) S: 0.1 | 30 days supply | Qty: 30 | Fill #0

## 2016-11-26 NOTE — Progress Notes (Signed)

## 2016-11-27 ENCOUNTER — Telehealth: Payer: 59 | Admitting: Family

## 2016-11-27 DIAGNOSIS — B9689 Other specified bacterial agents as the cause of diseases classified elsewhere: Secondary | ICD-10-CM | POA: Diagnosis not present

## 2016-11-27 DIAGNOSIS — J028 Acute pharyngitis due to other specified organisms: Secondary | ICD-10-CM

## 2016-11-27 MED ORDER — BENZONATATE 100 MG PO CAPS: 100.0000 mg | ORAL_CAPSULE | Freq: Three times a day (TID) | ORAL | 0 refills | Status: DC | PRN

## 2016-11-27 MED ORDER — AZITHROMYCIN 250 MG PO TABS: ORAL_TABLET | ORAL | 0 refills | Status: DC

## 2016-11-27 MED ORDER — PREDNISONE 5 MG PO TABS: 5.0000 mg | ORAL_TABLET | ORAL | 0 refills | Status: DC

## 2016-11-27 NOTE — Progress Notes (Signed)
Thank you for the details you included in the comment boxes. Those details are very helpful in determining the best course of treatment for you and help us to provide the best care.  We are sorry that you are not feeling well.  Here is how we plan to help!  Based on your presentation I believe you most likely have A cough due to bacteria.  When patients have a fever and a productive cough with a change in color or increased sputum production, we are concerned about bacterial bronchitis.  If left untreated it can progress to pneumonia.  If your symptoms do not improve with your treatment plan it is important that you contact your provider.   I have prescribed Azithromyin 250 mg: two tablets now and then one tablet daily for 4 additonal days    In addition you may use A non-prescription cough medication called Mucinex DM: take 2 tablets every 12 hours. and A prescription cough medication called Tessalon Perles 100mg. You may take 1-2 capsules every 8 hours as needed for your cough.  Sterapred 5 mg dosepak  From your responses in the eVisit questionnaire you describe inflammation in the upper respiratory tract which is causing a significant cough.  This is commonly called Bronchitis and has four common causes:    Allergies  Viral Infections  Acid Reflux  Bacterial Infection Allergies, viruses and acid reflux are treated by controlling symptoms or eliminating the cause. An example might be a cough caused by taking certain blood pressure medications. You stop the cough by changing the medication. Another example might be a cough caused by acid reflux. Controlling the reflux helps control the cough.  USE OF BRONCHODILATOR ("RESCUE") INHALERS: There is a risk from using your bronchodilator too frequently.  The risk is that over-reliance on a medication which only relaxes the muscles surrounding the breathing tubes can reduce the effectiveness of medications prescribed to reduce swelling and congestion  of the tubes themselves.  Although you feel brief relief from the bronchodilator inhaler, your asthma may actually be worsening with the tubes becoming more swollen and filled with mucus.  This can delay other crucial treatments, such as oral steroid medications. If you need to use a bronchodilator inhaler daily, several times per day, you should discuss this with your provider.  There are probably better treatments that could be used to keep your asthma under control.     HOME CARE . Only take medications as instructed by your medical team. . Complete the entire course of an antibiotic. . Drink plenty of fluids and get plenty of rest. . Avoid close contacts especially the very young and the elderly . Cover your mouth if you cough or cough into your sleeve. . Always remember to wash your hands . A steam or ultrasonic humidifier can help congestion.   GET HELP RIGHT AWAY IF: . You develop worsening fever. . You become short of breath . You cough up blood. . Your symptoms persist after you have completed your treatment plan MAKE SURE YOU   Understand these instructions.  Will watch your condition.  Will get help right away if you are not doing well or get worse.  Your e-visit answers were reviewed by a board certified advanced clinical practitioner to complete your personal care plan.  Depending on the condition, your plan could have included both over the counter or prescription medications. If there is a problem please reply  once you have received a response from your provider. Your   safety is important to us.  If you have drug allergies check your prescription carefully.    You can use MyChart to ask questions about today's visit, request a non-urgent call back, or ask for a work or school excuse for 24 hours related to this e-Visit. If it has been greater than 24 hours you will need to follow up with your provider, or enter a new e-Visit to address those concerns. You will get an  e-mail in the next two days asking about your experience.  I hope that your e-visit has been valuable and will speed your recovery. Thank you for using e-visits.   

## 2016-12-01 ENCOUNTER — Ambulatory Visit: Payer: Self-pay | Admitting: Surgery

## 2016-12-01 DIAGNOSIS — K219 Gastro-esophageal reflux disease without esophagitis: Secondary | ICD-10-CM | POA: Diagnosis not present

## 2016-12-01 DIAGNOSIS — K449 Diaphragmatic hernia without obstruction or gangrene: Secondary | ICD-10-CM | POA: Diagnosis not present

## 2016-12-01 NOTE — H&P (Signed)
[name] 12/01/2016 11:17 AM Location: [facility] Patient #: [id number] DOB: 03/27/1970 Single / Language: [name] / Race: White Male  History of Present Illness Bruce Hector MD; 12/01/2016 12:13 PM) The patient is a 46 year old male who presents with a hiatal hernia. Note for "Hiatal hernia": ` ` ` Patient Care Team: [name], MD as PCP - General (Family Medicine) [name], MD as Consulting Physician (General Surgery) [name], [name], MD as Consulting Physician (Gastroenterology) [name], NP as Nurse Practitioner (Internal Medicine)   Patient sent for surgical consultation at the request of Dr. [name]  Chief Complaint: Recurrent hiatal hernia. Request for surgery.  The patient is a active male. History of hiatal hernia. Laparoscopic repaired in the 1990s up in [place]. He had worsening heartburn and reflux issues. He's been on an antacid medications for a long time. I pain issues and ended up having a bleeding ulcer that required emergent gastric surgery in 2012. Developed incisional hernia that required an open retrorectus repair with a 30 x 20 cm alter pro mesh in 2013. No abdominal surgery since. He's had evidence of a recurrent hiatal hernia for the past few years. He's been taking Protonix. Worsening symptoms of heartburn or reflux. Worsening symptoms of dysphagia to solids, especially meats. He is now on Protonix, Zantac, intermittent Carafate. Still struggled with heartburn and reflux. His gas was gastroenterology. Endoscopy noted irritated hiatal hernia. Larger. Surgical consultation requested. Patient referred by my partner given his impending retirement and my expertise in reoperative foregut surgery.  Patient usually moves his bowels a few times a day. Does not have issues of emesis. Some occasional bloating and fullness and nausea. He occasionally sees narcotic for some pain issues. Not constantly ago. He can walk  a half hour without difficulty.  (Review of systems as stated in this history (HPI) or in the review of systems. Otherwise all other 12 point ROS are negative)   Allergies [name], RMA; 12/01/2016 11:18 AM) Reglan *GASTROINTESTINAL AGENTS - MISC.* HYDROmorphone HCl *ANALGESICS - OPIOID* Allergies Reconciled  Medication History [name], RMA; 12/01/2016 11:19 AM) ClonazePAM (0.5MG  Tablet Disint, Oral) Active. Sucralfate (1GM Tablet, Oral) Active. Zolpidem Tartrate ER (12.5MG  Tablet ER, Oral) Active. Pantoprazole Sodium (40MG  Tablet DR, Oral) Active. ClonazePAM (0.5MG  Tablet, Oral) Active. Azelastine HCl (0.1% Solution, Nasal) Active. Azithromycin (250MG  Tablet, Oral) Active. Benzonatate (100MG  Capsule, Oral) Active. Doxycycline Hyclate (100MG  Tablet, Oral) Active. PredniSONE (5MG  (21) Tab Ther Pack, Oral) Active. Vitamin D (Ergocalciferol) (50000UNIT Capsule, Oral) Active. Medications Reconciled    Vitals [name] RMA; 12/01/2016 11:18 AM) 12/01/2016 11:17 AM Weight: 221.6 lb Height: 69in Body Surface Area: 2.16 m Body Mass Index: 32.72 kg/m  Temp.: 57F  Pulse: 98 (Regular)  BP: 135/72 (Sitting, Left Arm, Standard)      Physical Exam Bruce Hector MD; 12/01/2016 12:09 PM)  General Mental Status-Alert. General Appearance-Not in acute distress, Not Sickly. Orientation-Oriented X3. Hydration-Well hydrated. Voice-Normal.  Integumentary Global Assessment Upon inspection and palpation of skin surfaces of the - Axillae: non-tender, no inflammation or ulceration, no drainage. and Distribution of scalp and body hair is normal. General Characteristics Temperature - normal warmth is noted.  Head and Neck Head-normocephalic, atraumatic with no lesions or palpable masses. Face Global Assessment - atraumatic, no absence of expression. Neck Global Assessment - no abnormal movements, no bruit auscultated on the  right, no bruit auscultated on the left, no decreased range of motion, non-tender. Trachea-midline. Thyroid Gland Characteristics - non-tender.  Eye Eyeball - Left-Extraocular movements intact, No Nystagmus. Eyeball - Right-Extraocular movements intact, No Nystagmus. Cornea - Left-No Hazy. Cornea - Right-No Hazy. Sclera/Conjunctiva - Left-No scleral icterus, No Discharge. Sclera/Conjunctiva - Right-No scleral icterus, No Discharge. Pupil - Left-Direct reaction to light normal. Pupil - Right-Direct reaction to light normal.  ENMT Ears Pinna - Left - no drainage observed, no generalized tenderness observed. Right - no drainage observed, no generalized tenderness observed. Nose and Sinuses External Inspection of the Nose - no destructive lesion observed. Inspection of the nares - Left - quiet respiration. Right - quiet respiration. Mouth and Throat Lips - Upper Lip - no fissures observed, no pallor noted. Lower Lip - no fissures observed, no pallor noted. Nasopharynx - no discharge present. Oral Cavity/Oropharynx - Tongue - no dryness observed. Oral Mucosa - no cyanosis observed. Hypopharynx - no evidence of airway distress observed.  Chest and Lung Exam Inspection Movements - Normal and Symmetrical. Accessory muscles - No use of accessory muscles in breathing. Palpation Palpation of the chest reveals - Non-tender. Auscultation Breath sounds - Normal and Clear.  Cardiovascular Auscultation Rhythm - Regular. Murmurs & Other Heart Sounds - Auscultation of the heart reveals - No Murmurs and No Systolic Clicks.  Abdomen Inspection Inspection of the abdomen reveals - No Visible peristalsis and No Abnormal pulsations. Umbilicus - No Bleeding, No Urine drainage. Palpation/Percussion Palpation and Percussion of the abdomen reveal - Soft, Non Tender, No Rebound tenderness, No Rigidity (guarding) and No Cutaneous hyperesthesia. Note: Abdomen soft. Midline incisions.  Nontender. Not distended. No umbilical or incisional hernias. No guarding.  Male Genitourinary Sexual Maturity Tanner 5 - Adult hair pattern and Adult penile size and shape.  Peripheral Vascular Upper Extremity Inspection - Left - No Cyanotic nailbeds, Not Ischemic. Right - No Cyanotic nailbeds, Not Ischemic.  Neurologic Neurologic evaluation reveals -normal attention span and ability to concentrate, able to name objects and repeat phrases. Appropriate fund of knowledge , normal sensation and normal coordination. Mental Status Affect - not angry, not paranoid. Cranial Nerves-Normal Bilaterally. Gait-Normal.  Neuropsychiatric Mental status exam performed with findings of-able to articulate well with normal speech/language, rate, volume and coherence, thought content normal with ability to perform basic computations and apply abstract reasoning and no evidence of hallucinations, delusions, obsessions or homicidal/suicidal ideation.  Musculoskeletal Global Assessment Spine, Ribs and Pelvis - no instability, subluxation or laxity. Right Upper Extremity - no instability, subluxation or laxity.  Lymphatic Head & Neck  General Head & Neck Lymphatics: Bilateral - Description - No Localized lymphadenopathy. Axillary  General Axillary Region: Bilateral - Description - No Localized lymphadenopathy. Femoral & Inguinal  Generalized Femoral & Inguinal Lymphatics: Left - Description - No Localized lymphadenopathy. Right - Description - No Localized lymphadenopathy.    Assessment & Plan Bruce Hector MD; 12/01/2016 12:09 PM)  HIATAL HERNIA WITH GERD (K21.9) Impression: Recurrent paraesophageal hiatal hernia with history of prior fundoplication 20 years ago. Most likely simple suturing of the diaphragm in 3 stitch Nissen. In flow not available.  It is reasonable to proceed with laparoscopic possibly robotic lysis of adhesions. Reduce the hiatal hernia. Take down the wrap. Do  endoscopy to rule out any leaks or other abnormalities. Close the hiatal hernia with pledgeted sutures with an onlay biologic Phasix mesh to minimize recurrence. Redo the wrap to centimeter complete fundoplication with anterior and posterior gastropexy.  I think from a cardiopulmonary standpoint, and his operative risks that high. The bigger risk is esophagogastric injury, vagal nerve injury. Probably it'll be  unlikely be able to spare both vagus nerves.  I would like to get a gastric emptying study given his bloating and heartburn to make sure that that is not an issue. If he has profoundly delayed gastric emptying, would benefit from concurrent pyloroplasty. It is normally mild, fundoplication should help improve that  The other big challenge is his prior abdominal mesh repair. He has retrorectus ultra Pro mesh from rectus to rectus. Most likely would start with optical entry in the anterior axillary line. Probably right upper quadrant to say from the prior gastric operations. Lysis of adhesions. Possibly able to do this robotically but more likely will do it laparoscopically. We will see.  Current Plans You are being scheduled for surgery- Our schedulers will call you.  You should hear from our office's scheduling department within 5 working days about the location, date, and time of surgery. We try to make accommodations for patient's preferences in scheduling surgery, but sometimes the OR schedule or the surgeon's schedule prevents [place] from making those accommodations.  If you have not heard from our office [phone]) in 5 working days, call the office and ask for your surgeon's nurse.  If you have other questions about your diagnosis, plan, or surgery, call the office and ask for your surgeon's nurse.  Follow Up - Call [facility] office after tests / studies doneto discuss further plans Pt Education - [facility] Esophageal Surgery Diet HCI (Riko Lumsden): discussed with patient and provided  information. Pt Education - [facility] Laparoscopic Surgery HCI The anatomy & physiology of the foregut and anti-reflux mechanism was discussed. The pathophysiology of hiatal herniation and GERD was discussed. Natural history risks without surgery was discussed. The patient's symptoms are not adequately controlled by medicines and other non-operative treatments. I feel the risks of no intervention will lead to serious problems that outweigh the operative risks; therefore, I recommended surgery to reduce the hiatal hernia out of the chest and fundoplication to rebuild the anti-reflux valve and control reflux better. Need for a thorough workup to rule out the differential diagnosis and plan treatment was explained. I explained laparoscopic techniques with possible need for an open approach.  Risks such as bleeding, infection, abscess, leak, need for further treatment, heart attack, death, and other risks were discussed. I noted a good likelihood this will help address the problem. Goals of post-operative recovery were discussed as well. Possibility that this will not correct all symptoms was explained. Post-operative dysphagia, need for short-term liquid & pureed diet, inability to vomit, possibility of reherniation, possible need for medicines to help control symptoms in addition to surgery were discussed. We will work to minimize complications. Educational handouts further explaining the pathology, treatment options, and dysphagia diet was given as well. Questions were answered. The patient expresses understanding & wishes to proceed with surgery.  [name], M.D., F.A.C.S. Gastrointestinal and Minimally Invasive Surgery [facility] [address] [place],  [address] [phone] Main / Paging

## 2016-12-02 ENCOUNTER — Other Ambulatory Visit (HOSPITAL_COMMUNITY): Payer: Self-pay | Admitting: Surgery

## 2016-12-02 DIAGNOSIS — K219 Gastro-esophageal reflux disease without esophagitis: Secondary | ICD-10-CM

## 2016-12-02 DIAGNOSIS — K449 Diaphragmatic hernia without obstruction or gangrene: Secondary | ICD-10-CM

## 2016-12-15 ENCOUNTER — Encounter (HOSPITAL_COMMUNITY)
Admission: RE | Admit: 2016-12-15 | Discharge: 2016-12-15 | Disposition: A | Discharge status: Home / Self Care | Payer: 59 | Source: Ambulatory Visit | Attending: Surgery | Admitting: Surgery

## 2016-12-15 DIAGNOSIS — K449 Diaphragmatic hernia without obstruction or gangrene: Secondary | ICD-10-CM | POA: Insufficient documentation

## 2016-12-15 DIAGNOSIS — K219 Gastro-esophageal reflux disease without esophagitis: Secondary | ICD-10-CM | POA: Diagnosis not present

## 2016-12-15 MED ORDER — TECHNETIUM TC 99M SULFUR COLLOID: 2.0800 | Freq: Once | INTRAVENOUS | Status: DC | PRN

## 2016-12-29 ENCOUNTER — Other Ambulatory Visit: Payer: Self-pay

## 2016-12-29 ENCOUNTER — Encounter: Payer: Self-pay | Admitting: Family Medicine

## 2016-12-29 MED ORDER — VITAMIN D (ERGOCALCIFEROL) 1.25 MG (50000 UNIT) PO CAPS: 50000.0000 [IU] | ORAL_CAPSULE | ORAL | 0 refills | Status: DC

## 2016-12-29 MED FILL — VIT D2 1.25 MG (50,000 UNIT: 1.25 MG | 42 days supply | Qty: 6 | Fill #0

## 2016-12-31 ENCOUNTER — Ambulatory Visit: Payer: 59 | Admitting: Internal Medicine

## 2017-01-08 ENCOUNTER — Encounter: Payer: Self-pay | Admitting: Internal Medicine

## 2017-01-08 ENCOUNTER — Ambulatory Visit: Payer: 59 | Admitting: Internal Medicine

## 2017-01-08 VITALS — Ht 69.0 in | Wt 222.0 lb

## 2017-01-08 DIAGNOSIS — K648 Other hemorrhoids: Secondary | ICD-10-CM

## 2017-01-08 NOTE — Progress Notes (Signed)
[name] is a 46 year old male with a history of hemorrhoids, sessile serrated colon polyp, GERD with severe reflux esophagitis, history of Nissen with recurrent hernia scheduled for robotic repair of his recurrent hiatal hernia on 01/22/2017 with Dr. Johney Maine He is here for symptomatic hemorrhoids.  Internal hemorrhoids were found during the time of his colonoscopy He reports 2-3 bowel movements per day with intermittently hard stool.  He has history of anal itching and anal burning.  No fecal smearing or seepage.  He does have bleeding with bowel movements on occasion. Sounds like he had prior incision and drainage years ago for a thrombosed external hemorrhoid   PROCEDURE NOTE:  The patient presents with symptomatic grade 2 internal hemorrhoids, requesting rubber band ligation of his hemorrhoidal disease.  All risks, benefits and alternative forms of therapy were described and informed consent was obtained.   The anorectum was pre-medicated with 0.125% nitroglycerin ointment The decision was made to band the RA internal hemorrhoid, and the [place] was used to perform band ligation without complication.   Digital anorectal examination was then performed to assure proper positioning of the band, and to adjust the banded tissue as required.  The patient was discharged home without pain or other issues.  Dietary and behavioral recommendations were given and along with follow-up instructions.      The patient will return as scheduled for  follow-up and possible additional banding as required. No complications were encountered and the patient tolerated the procedure well.

## 2017-01-08 NOTE — Patient Instructions (Addendum)
You have been scheduled for your 2nd banding on 03/02/17 at 3:45 pm.   HEMORRHOID BANDING PROCEDURE    FOLLOW-UP CARE   1. The procedure you have had should have been relatively painless since the banding of the area involved does not have nerve endings and there is no pain sensation.  The rubber band cuts off the blood supply to the hemorrhoid and the band may fall off as soon as 48 hours after the banding (the band may occasionally be seen in the toilet bowl following a bowel movement). You may notice a temporary feeling of fullness in the rectum which should respond adequately to plain Tylenol or Motrin.  2. Following the banding, avoid strenuous exercise that evening and resume full activity the next day.  A sitz bath (soaking in a warm tub) or bidet is soothing, and can be useful for cleansing the area after bowel movements.     3. To avoid constipation, take two tablespoons of natural wheat bran, natural oat bran, flax, Benefiber or any over the counter fiber supplement and increase your water intake to 7-8 glasses daily.    4. Unless you have been prescribed anorectal medication, do not put anything inside your rectum for two weeks: No suppositories, enemas, fingers, etc.  5. Occasionally, you may have more bleeding than usual after the banding procedure.  This is often from the untreated hemorrhoids rather than the treated one.  Don't be concerned if there is a tablespoon or so of blood.  If there is more blood than this, lie flat with your bottom higher than your head and apply an ice pack to the area. If the bleeding does not stop within a half an hour or if you feel faint, call our office at [phone] or go to the emergency room.  6. Problems are not common; however, if there is a substantial amount of bleeding, severe pain, chills, fever or difficulty passing urine (very rare) or other problems, you should call us at [phone] or report to the nearest emergency  room.  7. Do not stay seated continuously for more than 2-3 hours for a day or two after the procedure.  Tighten your buttock muscles 10-15 times every two hours and take 10-15 deep breaths every 1-2 hours.  Do not spend more than a few minutes on the toilet if you cannot empty your bowel; instead re-visit the toilet at a later time.

## 2017-01-12 MED FILL — PANTOPRAZOLE SOD DR 40 MG T: 40 | 90 days supply | Qty: 180 | Fill #1

## 2017-01-18 NOTE — Patient Instructions (Addendum)
DETRELL UMSCHEID  01/18/2017   Your procedure is scheduled on: 01-22-17  Report to [facility] Main  Entrance Take [place]  elevators to 3rd floor to  [place] at     [phone].    Call this number if you have problems the morning of surgery [phone]    Remember: ONLY 1 PERSON MAY GO WITH YOU TO SHORT STAY TO GET  READY MORNING OF YOUR SURGERY.  Do not eat food  :After Midnight. You may have clear liquids till 0430 am               Drink 2 ensure pre-surgery drinks before bedtime .  And one ensure pre-surgery drink at 0430 am the day of your surgery                  Take these medicines the morning of surgery with A SIP OF WATER:   Protonix, hydrocodone if needed                                You may not have any metal on your body including hair pins and              piercings  Do not wear jewelry, , lotions, powders or perfumes, deodorant                   Men may shave face and neck.   Do not bring valuables to the hospital. [place].  Contacts, dentures or bridgework may not be worn into surgery.  Leave suitcase in the car. After surgery it may be brought to your room.                Please read over the following fact sheets you were given: _____________________________________________________________________           [facility] - Preparing for Surgery Before surgery, you can play an important role.  Because skin is not sterile, your skin needs to be as free of germs as possible.  You can reduce the number of germs on your skin by washing with CHG (chlorahexidine gluconate) soap before surgery.  CHG is an antiseptic cleaner which kills germs and bonds with the skin to continue killing germs even after washing. Please DO NOT use if you have an allergy to CHG or antibacterial soaps.  If your skin becomes reddened/irritated stop using the CHG and inform your nurse when you arrive at Short  Stay. Do not shave (including legs and underarms) for at least 48 hours prior to the first CHG shower.  You may shave your face/neck. Please follow these instructions carefully:  1.  Shower with CHG Soap the night before surgery and the  morning of Surgery.  2.  If you choose to wash your hair, wash your hair first as usual with your  normal  shampoo.  3.  After you shampoo, rinse your hair and body thoroughly to remove the  shampoo.                           4.  Use CHG as you would any other liquid soap.  You can apply chg directly  to  the skin and wash                       Gently with a scrungie or clean washcloth.  5.  Apply the CHG Soap to your body ONLY FROM THE NECK DOWN.   Do not use on face/ open                           Wound or open sores. Avoid contact with eyes, ears mouth and genitals (private parts).                       Wash face,  Genitals (private parts) with your normal soap.             6.  Wash thoroughly, paying special attention to the area where your surgery  will be performed.  7.  Thoroughly rinse your body with warm water from the neck down.  8.  DO NOT shower/wash with your normal soap after using and rinsing off  the CHG Soap.                9.  Pat yourself dry with a clean towel.            10.  Wear clean pajamas.            11.  Place clean sheets on your bed the night of your first shower and do not  sleep with pets. Day of Surgery : Do not apply any lotions/deodorants the morning of surgery.  Please wear clean clothes to the hospital/surgery center.  FAILURE TO FOLLOW THESE INSTRUCTIONS MAY RESULT IN THE CANCELLATION OF YOUR SURGERY PATIENT SIGNATURE_________________________________  NURSE SIGNATURE__________________________________  ________________________________________________________________________    CLEAR LIQUID DIET   Foods Allowed                                                                     Foods Excluded  Coffee and tea,  regular and decaf                             liquids that you cannot  Plain Jell-O in any flavor                                             see through such as: Fruit ices (not with fruit pulp)                                     milk, soups, orange juice  Iced Popsicles                                    All solid food Carbonated beverages, regular and diet  Cranberry, grape and apple juices Sports drinks like Gatorade Lightly seasoned clear broth or consume(fat free) Sugar, honey syrup  Sample Menu Breakfast                                Lunch                                     Supper Cranberry juice                    Beef broth                            Chicken broth Jell-O                                     Grape juice                           Apple juice Coffee or tea                        Jell-O                                      Popsicle                                                Coffee or tea                        Coffee or tea  _____________________________________________________________________

## 2017-01-19 ENCOUNTER — Other Ambulatory Visit: Payer: Self-pay

## 2017-01-19 ENCOUNTER — Encounter (HOSPITAL_COMMUNITY)
Admission: RE | Admit: 2017-01-19 | Discharge: 2017-01-19 | Disposition: A | Discharge status: Home / Self Care | Payer: 59 | Source: Ambulatory Visit | Attending: Surgery | Admitting: Surgery

## 2017-01-19 ENCOUNTER — Encounter (HOSPITAL_COMMUNITY): Payer: Self-pay

## 2017-01-19 DIAGNOSIS — K449 Diaphragmatic hernia without obstruction or gangrene: Secondary | ICD-10-CM | POA: Insufficient documentation

## 2017-01-19 DIAGNOSIS — K660 Peritoneal adhesions (postprocedural) (postinfection): Secondary | ICD-10-CM | POA: Diagnosis not present

## 2017-01-19 DIAGNOSIS — Z01810 Encounter for preprocedural cardiovascular examination: Secondary | ICD-10-CM | POA: Insufficient documentation

## 2017-01-19 DIAGNOSIS — Z01812 Encounter for preprocedural laboratory examination: Secondary | ICD-10-CM | POA: Insufficient documentation

## 2017-01-19 DIAGNOSIS — R1310 Dysphagia, unspecified: Secondary | ICD-10-CM | POA: Insufficient documentation

## 2017-01-19 DIAGNOSIS — K219 Gastro-esophageal reflux disease without esophagitis: Secondary | ICD-10-CM | POA: Diagnosis not present

## 2017-01-19 DIAGNOSIS — F419 Anxiety disorder, unspecified: Secondary | ICD-10-CM | POA: Diagnosis not present

## 2017-01-19 HISTORY — DX: Pneumonia, unspecified organism: J18.9

## 2017-01-19 HISTORY — DX: Prediabetes: R73.03

## 2017-01-19 LAB — CBC
HCT: 43.1 % (ref 39.0–52.0)
Hemoglobin: 14.5 g/dL (ref 13.0–17.0)
MCH: 30.5 pg (ref 26.0–34.0)
MCHC: 33.6 g/dL (ref 30.0–36.0)
MCV: 90.5 fL (ref 78.0–100.0)
PLATELETS: 358 10*3/uL (ref 150–400)
RBC: 4.76 MIL/uL (ref 4.22–5.81)
RDW: 13.1 % (ref 11.5–15.5)
WBC: 9.9 10*3/uL (ref 4.0–10.5)

## 2017-01-19 LAB — BASIC METABOLIC PANEL
Anion gap: 7 (ref 5–15)
BUN: 18 mg/dL (ref 6–20)
CHLORIDE: 106 mmol/L (ref 101–111)
CO2: 26 mmol/L (ref 22–32)
CREATININE: 1.04 mg/dL (ref 0.61–1.24)
Calcium: 9.4 mg/dL (ref 8.9–10.3)
GFR calc Af Amer: >60 mL/min (ref >60)
GFR calc non Af Amer: >60 mL/min (ref >60)
Glucose, Bld: 106 mg/dL — ABNORMAL HIGH (ref 65–99)
Potassium: 4.2 mmol/L (ref 3.5–5.1)
Sodium: 139 mmol/L (ref 135–145)

## 2017-01-19 NOTE — Progress Notes (Signed)
Pre- surgery drinks given per Alison Stalling instruction. Alison Stalling ,RN was on unit seeing a patient.

## 2017-01-21 ENCOUNTER — Encounter (HOSPITAL_COMMUNITY): Payer: Self-pay | Admitting: Anesthesiology

## 2017-01-21 NOTE — Anesthesia Preprocedure Evaluation (Addendum)
Anesthesia Evaluation  Patient identified by MRN, date of birth, ID band Patient awake    Reviewed: Allergy & Precautions, NPO status , Patient's Chart, lab work & pertinent test results  Airway Mallampati: I       Dental no notable dental hx. (+) Teeth Intact   Pulmonary    Pulmonary exam normal        Cardiovascular Normal cardiovascular exam Rhythm:Regular Rate:Normal     Neuro/Psych PSYCHIATRIC DISORDERS Anxiety    GI/Hepatic Neg liver ROS, GERD  Medicated and Controlled,  Endo/Other  negative endocrine ROS  Renal/GU negative Renal ROS     Musculoskeletal   Abdominal (+) + obese,   Peds  Hematology negative hematology ROS (+)   Anesthesia Other Findings   Reproductive/Obstetrics                           Anesthesia Physical Anesthesia Plan  ASA: II  Anesthesia Plan: General   Post-op Pain Management:    Induction: Intravenous  PONV Risk Score and Plan: 4 or greater and Ondansetron, Dexamethasone, Midazolam and Promethazine  Airway Management Planned: Oral ETT  Additional Equipment:   Intra-op Plan:   Post-operative Plan: Extubation in OR  Informed Consent: I have reviewed the patients History and Physical, chart, labs and discussed the procedure including the risks, benefits and alternatives for the proposed anesthesia with the patient or authorized representative who has indicated his/her understanding and acceptance.     Plan Discussed with: CRNA and Surgeon  Anesthesia Plan Comments:       Anesthesia Quick Evaluation

## 2017-01-22 ENCOUNTER — Inpatient Hospital Stay (HOSPITAL_COMMUNITY)
Admission: AD | Admit: 2017-01-22 | Discharge: 2017-01-26 | DRG: 328 | Disposition: A | Discharge status: Home / Self Care | Payer: 59 | Source: Ambulatory Visit | Attending: Surgery | Admitting: Surgery

## 2017-01-22 ENCOUNTER — Ambulatory Visit (HOSPITAL_COMMUNITY): Payer: 59 | Admitting: Certified Registered Nurse Anesthetist

## 2017-01-22 ENCOUNTER — Encounter (HOSPITAL_COMMUNITY): Payer: Self-pay | Admitting: Emergency Medicine

## 2017-01-22 ENCOUNTER — Encounter (HOSPITAL_COMMUNITY)
Admission: AD | Disposition: A | Discharge status: Home / Self Care | Payer: Self-pay | Source: Ambulatory Visit | Attending: Surgery

## 2017-01-22 ENCOUNTER — Inpatient Hospital Stay (HOSPITAL_COMMUNITY): Payer: 59

## 2017-01-22 ENCOUNTER — Other Ambulatory Visit: Payer: Self-pay

## 2017-01-22 DIAGNOSIS — K440 Diaphragmatic hernia with obstruction, without gangrene: Secondary | ICD-10-CM | POA: Diagnosis present

## 2017-01-22 DIAGNOSIS — K219 Gastro-esophageal reflux disease without esophagitis: Secondary | ICD-10-CM | POA: Diagnosis present

## 2017-01-22 DIAGNOSIS — R079 Chest pain, unspecified: Secondary | ICD-10-CM | POA: Diagnosis not present

## 2017-01-22 DIAGNOSIS — R0902 Hypoxemia: Secondary | ICD-10-CM

## 2017-01-22 DIAGNOSIS — K660 Peritoneal adhesions (postprocedural) (postinfection): Secondary | ICD-10-CM | POA: Diagnosis present

## 2017-01-22 DIAGNOSIS — K449 Diaphragmatic hernia without obstruction or gangrene: Principal | ICD-10-CM | POA: Diagnosis present

## 2017-01-22 DIAGNOSIS — R1310 Dysphagia, unspecified: Secondary | ICD-10-CM | POA: Diagnosis present

## 2017-01-22 DIAGNOSIS — F419 Anxiety disorder, unspecified: Secondary | ICD-10-CM | POA: Diagnosis present

## 2017-01-22 DIAGNOSIS — K254 Chronic or unspecified gastric ulcer with hemorrhage: Secondary | ICD-10-CM | POA: Diagnosis present

## 2017-01-22 DIAGNOSIS — G4700 Insomnia, unspecified: Secondary | ICD-10-CM | POA: Diagnosis present

## 2017-01-22 DIAGNOSIS — K259 Gastric ulcer, unspecified as acute or chronic, without hemorrhage or perforation: Secondary | ICD-10-CM | POA: Diagnosis not present

## 2017-01-22 DIAGNOSIS — Z8719 Personal history of other diseases of the digestive system: Secondary | ICD-10-CM

## 2017-01-22 HISTORY — DX: Incisional hernia without obstruction or gangrene: K43.2

## 2017-01-22 HISTORY — PX: ESOPHAGOGASTRODUODENOSCOPY: SHX5428

## 2017-01-22 HISTORY — PX: INSERTION OF MESH: SHX5868

## 2017-01-22 HISTORY — DX: Chronic or unspecified gastric ulcer with hemorrhage: K25.4

## 2017-01-22 SURGERY — FUNDOPLICATION, NISSEN, ROBOT-ASSISTED, LAPAROSCOPIC
Anesthesia: General | Site: Esophagus

## 2017-01-22 MED ORDER — ROCURONIUM BROMIDE 50 MG/5ML IV SOSY
PREFILLED_SYRINGE | INTRAVENOUS | Status: AC
  Filled 2017-01-22: qty 5

## 2017-01-22 MED ORDER — PROPOFOL 10 MG/ML IV BOLUS
INTRAVENOUS | Status: DC | PRN
  Administered 2017-01-22: 200 mg via INTRAVENOUS

## 2017-01-22 MED ORDER — PHENYLEPHRINE HCL 10 MG/ML IJ SOLN: INTRAVENOUS | Status: DC | PRN

## 2017-01-22 MED ORDER — SUGAMMADEX SODIUM 200 MG/2ML IV SOLN
INTRAVENOUS | Status: AC
  Filled 2017-01-22: qty 2

## 2017-01-22 MED ORDER — PANTOPRAZOLE SODIUM 40 MG PO TBEC
40.0000 mg | DELAYED_RELEASE_TABLET | Freq: Every day | ORAL | Status: DC
  Administered 2017-01-22 – 2017-01-25 (×4): 40 mg via ORAL
  Filled 2017-01-22 (×4): qty 1

## 2017-01-22 MED ORDER — HYDROCORTISONE 2.5 % RE CREA
1.0000 "application " | TOPICAL_CREAM | Freq: Four times a day (QID) | RECTAL | Status: DC | PRN
  Filled 2017-01-22: qty 28.35

## 2017-01-22 MED ORDER — METRONIDAZOLE IN NACL 5-0.79 MG/ML-% IV SOLN
500.0000 mg | INTRAVENOUS | Status: AC
  Administered 2017-01-22: 500 mg via INTRAVENOUS
  Filled 2017-01-22 (×2): qty 100

## 2017-01-22 MED ORDER — GABAPENTIN 300 MG PO CAPS
300.0000 mg | ORAL_CAPSULE | Freq: Every day | ORAL | Status: AC
  Administered 2017-01-22 – 2017-01-24 (×3): 300 mg via ORAL
  Filled 2017-01-22 (×3): qty 1

## 2017-01-22 MED ORDER — PHENYLEPHRINE HCL 10 MG/ML IJ SOLN
INTRAVENOUS | Status: DC | PRN
  Administered 2017-01-22: 50 ug/min via INTRAVENOUS

## 2017-01-22 MED ORDER — LACTATED RINGERS IR SOLN
Status: DC | PRN
  Administered 2017-01-22: 1000 mL

## 2017-01-22 MED ORDER — LACTATED RINGERS IV SOLN: 1000.0000 mL | Freq: Three times a day (TID) | INTRAVENOUS | Status: AC | PRN

## 2017-01-22 MED ORDER — HYDROMORPHONE HCL 1 MG/ML IJ SOLN: 0.5000 mg | INTRAMUSCULAR | Status: DC | PRN

## 2017-01-22 MED ORDER — 0.9 % SODIUM CHLORIDE (POUR BTL) OPTIME
TOPICAL | Status: DC | PRN
  Administered 2017-01-22: 1000 mL

## 2017-01-22 MED ORDER — HYDRALAZINE HCL 20 MG/ML IJ SOLN: 5.0000 mg | INTRAMUSCULAR | Status: DC | PRN

## 2017-01-22 MED ORDER — ENSURE SURGERY PO LIQD
237.0000 mL | Freq: Two times a day (BID) | ORAL | Status: DC
  Administered 2017-01-22 – 2017-01-26 (×8): 237 mL via ORAL
  Filled 2017-01-22 (×9): qty 237

## 2017-01-22 MED ORDER — BUPIVACAINE-EPINEPHRINE 0.25% -1:200000 IJ SOLN
INTRAMUSCULAR | Status: DC | PRN
  Administered 2017-01-22: 50 mL

## 2017-01-22 MED ORDER — MIDAZOLAM HCL 5 MG/5ML IJ SOLN
INTRAMUSCULAR | Status: DC | PRN
  Administered 2017-01-22: 2 mg via INTRAVENOUS

## 2017-01-22 MED ORDER — DEXTROSE 5 % IV SOLN
INTRAVENOUS | Status: AC
  Filled 2017-01-22: qty 2

## 2017-01-22 MED ORDER — MIDAZOLAM HCL 2 MG/2ML IJ SOLN
INTRAMUSCULAR | Status: AC
  Filled 2017-01-22: qty 2

## 2017-01-22 MED ORDER — PHENYLEPHRINE 40 MCG/ML (10ML) SYRINGE FOR IV PUSH (FOR BLOOD PRESSURE SUPPORT)
PREFILLED_SYRINGE | INTRAVENOUS | Status: AC
  Filled 2017-01-22: qty 10

## 2017-01-22 MED ORDER — HYDROCORTISONE 1 % EX CREA: 1.0000 "application " | TOPICAL_CREAM | Freq: Three times a day (TID) | CUTANEOUS | Status: DC | PRN

## 2017-01-22 MED ORDER — ACETAMINOPHEN 500 MG PO TABS
1000.0000 mg | ORAL_TABLET | ORAL | Status: AC
  Administered 2017-01-22: 1000 mg via ORAL
  Filled 2017-01-22: qty 2

## 2017-01-22 MED ORDER — PHENOL 1.4 % MT LIQD: 1.0000 | OROMUCOSAL | Status: DC | PRN

## 2017-01-22 MED ORDER — ALBUMIN HUMAN 5 % IV SOLN
INTRAVENOUS | Status: DC | PRN
  Administered 2017-01-22: 10:00:00 via INTRAVENOUS

## 2017-01-22 MED ORDER — MORPHINE SULFATE (PF) 2 MG/ML IV SOLN
2.0000 mg | INTRAVENOUS | Status: DC | PRN
  Administered 2017-01-22 – 2017-01-26 (×11): 2 mg via INTRAVENOUS
  Filled 2017-01-22 (×11): qty 1

## 2017-01-22 MED ORDER — FENTANYL CITRATE (PF) 100 MCG/2ML IJ SOLN
INTRAMUSCULAR | Status: AC
  Filled 2017-01-22: qty 2

## 2017-01-22 MED ORDER — ONDANSETRON HCL 4 MG/2ML IJ SOLN
INTRAMUSCULAR | Status: DC | PRN
  Administered 2017-01-22: 4 mg via INTRAVENOUS

## 2017-01-22 MED ORDER — SUGAMMADEX SODIUM 200 MG/2ML IV SOLN
INTRAVENOUS | Status: AC
Start: 2017-01-22 — End: ?
  Filled 2017-01-22: qty 2

## 2017-01-22 MED ORDER — ONDANSETRON HCL 4 MG/2ML IJ SOLN: 4.0000 mg | Freq: Four times a day (QID) | INTRAMUSCULAR | Status: DC | PRN

## 2017-01-22 MED ORDER — METOCLOPRAMIDE HCL 5 MG/ML IJ SOLN: 5.0000 mg | Freq: Four times a day (QID) | INTRAMUSCULAR | Status: DC | PRN

## 2017-01-22 MED ORDER — ALBUMIN HUMAN 5 % IV SOLN
INTRAVENOUS | Status: AC
  Filled 2017-01-22: qty 250

## 2017-01-22 MED ORDER — EPHEDRINE SULFATE-NACL 50-0.9 MG/10ML-% IV SOSY
PREFILLED_SYRINGE | INTRAVENOUS | Status: DC | PRN
  Administered 2017-01-22 (×2): 5 mg via INTRAVENOUS

## 2017-01-22 MED ORDER — SODIUM CHLORIDE 0.9% FLUSH
3.0000 mL | Freq: Two times a day (BID) | INTRAVENOUS | Status: DC
  Administered 2017-01-22 – 2017-01-23 (×2): 3 mL via INTRAVENOUS

## 2017-01-22 MED ORDER — LIP MEDEX EX OINT
1.0000 "application " | TOPICAL_OINTMENT | Freq: Two times a day (BID) | CUTANEOUS | Status: DC
  Administered 2017-01-22 – 2017-01-26 (×7): 1 via TOPICAL
  Filled 2017-01-22 (×3): qty 7

## 2017-01-22 MED ORDER — ONDANSETRON 4 MG PO TBDP: 4.0000 mg | ORAL_TABLET | Freq: Four times a day (QID) | ORAL | Status: DC | PRN

## 2017-01-22 MED ORDER — ENOXAPARIN SODIUM 40 MG/0.4ML ~~LOC~~ SOLN
40.0000 mg | SUBCUTANEOUS | Status: DC
  Administered 2017-01-23 – 2017-01-26 (×4): 40 mg via SUBCUTANEOUS
  Filled 2017-01-22 (×4): qty 0.4

## 2017-01-22 MED ORDER — EPHEDRINE 5 MG/ML INJ
INTRAVENOUS | Status: AC
  Filled 2017-01-22: qty 10

## 2017-01-22 MED ORDER — PHENYLEPHRINE HCL 10 MG/ML IJ SOLN
INTRAMUSCULAR | Status: AC
  Filled 2017-01-22: qty 2

## 2017-01-22 MED ORDER — BUPIVACAINE LIPOSOME 1.3 % IJ SUSP
20.0000 mL | Freq: Once | INTRAMUSCULAR | Status: AC
  Administered 2017-01-22: 20 mL
  Filled 2017-01-22: qty 20

## 2017-01-22 MED ORDER — LIDOCAINE 2% (20 MG/ML) 5 ML SYRINGE: INTRAMUSCULAR | Status: DC | PRN

## 2017-01-22 MED ORDER — LIDOCAINE 2% (20 MG/ML) 5 ML SYRINGE
INTRAMUSCULAR | Status: DC | PRN
  Administered 2017-01-22: 1.5 mg/kg/h via INTRAVENOUS

## 2017-01-22 MED ORDER — ONDANSETRON HCL 4 MG PO TABS: 4.0000 mg | ORAL_TABLET | Freq: Three times a day (TID) | ORAL | 5 refills | Status: DC | PRN

## 2017-01-22 MED ORDER — POLYETHYLENE GLYCOL 3350 17 G PO PACK: 17.0000 g | PACK | Freq: Every day | ORAL | Status: DC | PRN

## 2017-01-22 MED ORDER — ZOLPIDEM TARTRATE 5 MG PO TABS
5.0000 mg | ORAL_TABLET | Freq: Every evening | ORAL | Status: DC | PRN
  Administered 2017-01-22 – 2017-01-25 (×4): 5 mg via ORAL
  Filled 2017-01-22 (×4): qty 1

## 2017-01-22 MED ORDER — PROMETHAZINE HCL 25 MG/ML IJ SOLN: 6.2500 mg | INTRAMUSCULAR | Status: DC | PRN

## 2017-01-22 MED ORDER — SUGAMMADEX SODIUM 200 MG/2ML IV SOLN
INTRAVENOUS | Status: DC | PRN
  Administered 2017-01-22: 400 mg via INTRAVENOUS

## 2017-01-22 MED ORDER — PROMETHAZINE HCL 25 MG RE SUPP: 25.0000 mg | Freq: Four times a day (QID) | RECTAL | 5 refills | Status: DC | PRN

## 2017-01-22 MED ORDER — HYDROCODONE-ACETAMINOPHEN 5-325 MG PO TABS
1.0000 | ORAL_TABLET | ORAL | Status: DC | PRN
  Administered 2017-01-22 – 2017-01-24 (×6): 2 via ORAL
  Filled 2017-01-22 (×6): qty 2

## 2017-01-22 MED ORDER — FENTANYL CITRATE (PF) 250 MCG/5ML IJ SOLN
INTRAMUSCULAR | Status: AC
  Filled 2017-01-22: qty 5

## 2017-01-22 MED ORDER — ONDANSETRON HCL 4 MG/2ML IJ SOLN: 4.0000 mg | Freq: Once | INTRAMUSCULAR | Status: DC | PRN

## 2017-01-22 MED ORDER — METHOCARBAMOL 500 MG PO TABS
500.0000 mg | ORAL_TABLET | Freq: Four times a day (QID) | ORAL | Status: DC | PRN
  Administered 2017-01-24 – 2017-01-25 (×4): 500 mg via ORAL
  Filled 2017-01-22 (×4): qty 1

## 2017-01-22 MED ORDER — KETAMINE HCL 10 MG/ML IJ SOLN
INTRAMUSCULAR | Status: DC | PRN
  Administered 2017-01-22: 35 mg via INTRAVENOUS
  Administered 2017-01-22 (×2): 15 mg via INTRAVENOUS

## 2017-01-22 MED ORDER — SODIUM CHLORIDE 0.9 % IV SOLN: 250.0000 mL | INTRAVENOUS | Status: DC | PRN

## 2017-01-22 MED ORDER — SODIUM CHLORIDE 0.9% FLUSH: 3.0000 mL | INTRAVENOUS | Status: DC | PRN

## 2017-01-22 MED ORDER — BUPIVACAINE-EPINEPHRINE 0.25% -1:200000 IJ SOLN
INTRAMUSCULAR | Status: AC
  Filled 2017-01-22: qty 1

## 2017-01-22 MED ORDER — MEPERIDINE HCL 50 MG/ML IJ SOLN: 6.2500 mg | INTRAMUSCULAR | Status: DC | PRN

## 2017-01-22 MED ORDER — SIMETHICONE 80 MG PO CHEW
40.0000 mg | CHEWABLE_TABLET | Freq: Four times a day (QID) | ORAL | Status: DC | PRN
  Administered 2017-01-24 – 2017-01-25 (×3): 40 mg via ORAL
  Filled 2017-01-22 (×3): qty 1

## 2017-01-22 MED ORDER — BISACODYL 10 MG RE SUPP: 10.0000 mg | Freq: Every day | RECTAL | Status: DC | PRN

## 2017-01-22 MED ORDER — HYDROCODONE-ACETAMINOPHEN 5-325 MG PO TABS: 1.0000 | ORAL_TABLET | Freq: Four times a day (QID) | ORAL | 0 refills | Status: DC | PRN

## 2017-01-22 MED ORDER — ONDANSETRON HCL 4 MG/2ML IJ SOLN
INTRAMUSCULAR | Status: AC
  Filled 2017-01-22: qty 2

## 2017-01-22 MED ORDER — GABAPENTIN 300 MG PO CAPS
300.0000 mg | ORAL_CAPSULE | ORAL | Status: AC
  Administered 2017-01-22: 300 mg via ORAL
  Filled 2017-01-22: qty 1

## 2017-01-22 MED ORDER — DEXAMETHASONE SODIUM PHOSPHATE 4 MG/ML IJ SOLN
INTRAMUSCULAR | Status: DC | PRN
  Administered 2017-01-22: 10 mg via INTRAVENOUS

## 2017-01-22 MED ORDER — ALUM & MAG HYDROXIDE-SIMETH 200-200-20 MG/5ML PO SUSP
30.0000 mL | Freq: Four times a day (QID) | ORAL | Status: DC | PRN
Start: 2017-01-22 — End: 2017-01-26

## 2017-01-22 MED ORDER — LIDOCAINE 2% (20 MG/ML) 5 ML SYRINGE
INTRAMUSCULAR | Status: DC | PRN
  Administered 2017-01-22: 60 mg via INTRAVENOUS

## 2017-01-22 MED ORDER — FENTANYL CITRATE (PF) 100 MCG/2ML IJ SOLN
25.0000 ug | INTRAMUSCULAR | Status: DC | PRN
  Administered 2017-01-22 (×2): 25 ug via INTRAVENOUS

## 2017-01-22 MED ORDER — ACETAMINOPHEN 500 MG PO TABS
1000.0000 mg | ORAL_TABLET | Freq: Four times a day (QID) | ORAL | Status: DC
  Administered 2017-01-22 – 2017-01-23 (×3): 1000 mg via ORAL
  Filled 2017-01-22 (×4): qty 2

## 2017-01-22 MED ORDER — LIDOCAINE 2% (20 MG/ML) 5 ML SYRINGE
INTRAMUSCULAR | Status: AC
  Filled 2017-01-22: qty 5

## 2017-01-22 MED ORDER — SODIUM CHLORIDE 0.9 % IV SOLN
25.0000 mg | Freq: Four times a day (QID) | INTRAVENOUS | Status: DC | PRN
  Filled 2017-01-22: qty 1

## 2017-01-22 MED ORDER — HYDROMORPHONE HCL 1 MG/ML IJ SOLN: 0.2500 mg | INTRAMUSCULAR | Status: DC | PRN

## 2017-01-22 MED ORDER — CLONAZEPAM 0.5 MG PO TBDP: 0.5000 mg | ORAL_TABLET | Freq: Two times a day (BID) | ORAL | Status: DC | PRN

## 2017-01-22 MED ORDER — DEXTROSE 5 % IV SOLN
2.0000 g | INTRAVENOUS | Status: AC
  Administered 2017-01-22: 2 g via INTRAVENOUS

## 2017-01-22 MED ORDER — STERILE WATER FOR IRRIGATION IR SOLN
Status: DC | PRN
  Administered 2017-01-22: 1000 mL

## 2017-01-22 MED ORDER — PHENYLEPHRINE 40 MCG/ML (10ML) SYRINGE FOR IV PUSH (FOR BLOOD PRESSURE SUPPORT)
PREFILLED_SYRINGE | INTRAVENOUS | Status: DC | PRN
  Administered 2017-01-22: 120 ug via INTRAVENOUS
  Administered 2017-01-22 (×7): 80 ug via INTRAVENOUS

## 2017-01-22 MED ORDER — PROPOFOL 10 MG/ML IV BOLUS
INTRAVENOUS | Status: AC
  Filled 2017-01-22: qty 40

## 2017-01-22 MED ORDER — MAGIC MOUTHWASH
15.0000 mL | Freq: Four times a day (QID) | ORAL | Status: DC | PRN
  Filled 2017-01-22: qty 15

## 2017-01-22 MED ORDER — SODIUM CHLORIDE 0.9 % IV SOLN
INTRAVENOUS | Status: DC
  Administered 2017-01-22 – 2017-01-23 (×2): via INTRAVENOUS

## 2017-01-22 MED ORDER — DIPHENHYDRAMINE HCL 50 MG/ML IJ SOLN: 12.5000 mg | Freq: Four times a day (QID) | INTRAMUSCULAR | Status: DC | PRN

## 2017-01-22 MED ORDER — MENTHOL 3 MG MT LOZG
1.0000 | LOZENGE | OROMUCOSAL | Status: DC | PRN
Start: 2017-01-22 — End: 2017-01-26

## 2017-01-22 MED ORDER — KETAMINE HCL 10 MG/ML IJ SOLN
INTRAMUSCULAR | Status: AC
  Filled 2017-01-22: qty 1

## 2017-01-22 MED ORDER — ROCURONIUM BROMIDE 10 MG/ML (PF) SYRINGE
PREFILLED_SYRINGE | INTRAVENOUS | Status: DC | PRN
  Administered 2017-01-22: 50 mg via INTRAVENOUS
  Administered 2017-01-22: 30 mg via INTRAVENOUS
  Administered 2017-01-22: 10 mg via INTRAVENOUS
  Administered 2017-01-22 (×3): 20 mg via INTRAVENOUS
  Administered 2017-01-22: 30 mg via INTRAVENOUS
  Administered 2017-01-22: 20 mg via INTRAVENOUS
  Administered 2017-01-22: 30 mg via INTRAVENOUS

## 2017-01-22 MED ORDER — CEFAZOLIN SODIUM-DEXTROSE 2-4 GM/100ML-% IV SOLN
INTRAVENOUS | Status: AC
  Filled 2017-01-22: qty 100

## 2017-01-22 MED ORDER — LACTATED RINGERS IV SOLN
INTRAVENOUS | Status: DC | PRN
  Administered 2017-01-22 (×4): via INTRAVENOUS

## 2017-01-22 MED ORDER — DIPHENHYDRAMINE HCL 12.5 MG/5ML PO ELIX: 12.5000 mg | ORAL_SOLUTION | Freq: Four times a day (QID) | ORAL | Status: DC | PRN

## 2017-01-22 MED ORDER — LIDOCAINE HCL 2 % IJ SOLN
INTRAMUSCULAR | Status: AC
  Filled 2017-01-22: qty 20

## 2017-01-22 MED ORDER — KETOROLAC TROMETHAMINE 30 MG/ML IJ SOLN: 30.0000 mg | Freq: Once | INTRAMUSCULAR | Status: DC | PRN

## 2017-01-22 MED ORDER — DEXAMETHASONE SODIUM PHOSPHATE 10 MG/ML IJ SOLN
INTRAMUSCULAR | Status: AC
  Filled 2017-01-22: qty 1

## 2017-01-22 MED ORDER — FENTANYL CITRATE (PF) 100 MCG/2ML IJ SOLN
INTRAMUSCULAR | Status: DC | PRN
  Administered 2017-01-22 (×4): 50 ug via INTRAVENOUS
  Administered 2017-01-22: 150 ug via INTRAVENOUS
  Administered 2017-01-22 (×4): 50 ug via INTRAVENOUS

## 2017-01-22 MED ORDER — GUAIFENESIN-DM 100-10 MG/5ML PO SYRP: 10.0000 mL | ORAL_SOLUTION | ORAL | Status: DC | PRN

## 2017-01-22 MED FILL — PROMETHAZINE 25 MG SUPP: 25 | 2 days supply | Qty: 5 | Fill #0

## 2017-01-22 MED FILL — ONDANSETRON HCL 4 MG TABLET: 4 | 3 days supply | Qty: 8 | Fill #0

## 2017-01-22 SURGICAL SUPPLY — 65 items
APPLIER CLIP 5 13 M/L LIGAMAX5 (MISCELLANEOUS)
APPLIER CLIP ROT 10 11.4 M/L (STAPLE)
APR CLP MED LRG 11.4X10 (STAPLE)
APR CLP MED LRG 5 ANG JAW (MISCELLANEOUS)
BLADE SURG SZ11 CARB STEEL (BLADE) ×3 IMPLANT
CHLORAPREP W/TINT 26ML (MISCELLANEOUS) ×3 IMPLANT
CLIP APPLIE 5 13 M/L LIGAMAX5 (MISCELLANEOUS) IMPLANT
CLIP APPLIE ROT 10 11.4 M/L (STAPLE) IMPLANT
COVER SURGICAL LIGHT HANDLE (MISCELLANEOUS) ×3 IMPLANT
COVER TIP SHEARS 8 DVNC (MISCELLANEOUS) ×2 IMPLANT
COVER TIP SHEARS 8MM DA VINCI (MISCELLANEOUS) ×1
DECANTER SPIKE VIAL GLASS SM (MISCELLANEOUS) ×3 IMPLANT
DRAIN CHANNEL 19F RND (DRAIN) ×1 IMPLANT
DRAIN PENROSE 18X1/2 LTX STRL (DRAIN) ×1 IMPLANT
DRAPE ARM DVNC X/XI (DISPOSABLE) ×8 IMPLANT
DRAPE COLUMN DVNC XI (DISPOSABLE) ×2 IMPLANT
DRAPE DA VINCI XI ARM (DISPOSABLE) ×4
DRAPE DA VINCI XI COLUMN (DISPOSABLE) ×1
DRAPE WARM FLUID 44X44 (DRAPE) ×2 IMPLANT
DRSG TEGADERM 2-3/8X2-3/4 SM (GAUZE/BANDAGES/DRESSINGS) ×9 IMPLANT
ELECT REM PT RETURN 15FT ADLT (MISCELLANEOUS) ×3 IMPLANT
ENDOLOOP SUT PDS II  0 18 (SUTURE)
ENDOLOOP SUT PDS II 0 18 (SUTURE) IMPLANT
EVACUATOR SILICONE 100CC (DRAIN) IMPLANT
FELT TEFLON 4 X1 (Mesh General) ×1 IMPLANT
GAUZE SPONGE 2X2 8PLY STRL LF (GAUZE/BANDAGES/DRESSINGS) ×2 IMPLANT
GLOVE ECLIPSE 8.0 STRL XLNG CF (GLOVE) ×6 IMPLANT
GLOVE INDICATOR 8.0 STRL GRN (GLOVE) ×6 IMPLANT
GOWN STRL REUS W/TWL XL LVL3 (GOWN DISPOSABLE) ×12 IMPLANT
KIT BASIN OR (CUSTOM PROCEDURE TRAY) ×3 IMPLANT
MESH PHASIX RESORB RECT 10X15 (Mesh General) ×1 IMPLANT
NDL INSUFFLATION 14GA 120MM (NEEDLE) ×2 IMPLANT
NEEDLE HYPO 22GX1.5 SAFETY (NEEDLE) ×3 IMPLANT
NEEDLE INSUFFLATION 14GA 120MM (NEEDLE) ×3 IMPLANT
PACK CARDIOVASCULAR III (CUSTOM PROCEDURE TRAY) ×3 IMPLANT
PAD POSITIONING PINK XL (MISCELLANEOUS) ×3 IMPLANT
SCISSORS LAP 5X45 EPIX DISP (ENDOMECHANICALS) ×3 IMPLANT
SEAL CANN UNIV 5-8 DVNC XI (MISCELLANEOUS) ×8 IMPLANT
SEAL XI 5MM-8MM UNIVERSAL (MISCELLANEOUS) ×4
SEALER VESSEL DA VINCI XI (MISCELLANEOUS) ×1
SEALER VESSEL EXT DVNC XI (MISCELLANEOUS) ×2 IMPLANT
SET TUBE IRRIG SUCTION NO TIP (IRRIGATION / IRRIGATOR) ×1 IMPLANT
SOLUTION ELECTROLUBE (MISCELLANEOUS) ×3 IMPLANT
SPONGE GAUZE 2X2 STER 10/PKG (GAUZE/BANDAGES/DRESSINGS) ×1
SPONGE LAP 18X18 X RAY DECT (DISPOSABLE) ×3 IMPLANT
SUT ETHIBOND 0 36 GRN (SUTURE) ×9 IMPLANT
SUT ETHIBOND NAB CT1 #1 30IN (SUTURE) ×10 IMPLANT
SUT MNCRL AB 4-0 PS2 18 (SUTURE) ×3 IMPLANT
SUT PROLENE 0 CT 2 (SUTURE) ×1 IMPLANT
SUT PROLENE 2 0 SH DA (SUTURE) ×1 IMPLANT
SUT V-LOC BARB 180 2/0GR6 GS22 (SUTURE)
SUT VIC AB 2-0 SH 27 (SUTURE) ×3
SUT VIC AB 2-0 SH 27X BRD (SUTURE) IMPLANT
SUTURE V-LC BRB 180 2/0GR6GS22 (SUTURE) IMPLANT
SYR 10ML LL (SYRINGE) ×3 IMPLANT
SYR 20CC LL (SYRINGE) ×3 IMPLANT
TIP INNERVISION DETACH 40FR (MISCELLANEOUS) IMPLANT
TIP INNERVISION DETACH 50FR (MISCELLANEOUS) IMPLANT
TIP INNERVISION DETACH 56FR (MISCELLANEOUS) ×1 IMPLANT
TIPS INNERVISION DETACH 40FR (MISCELLANEOUS)
TOWEL OR 17X26 10 PK STRL BLUE (TOWEL DISPOSABLE) ×3 IMPLANT
TOWEL OR NON WOVEN STRL DISP B (DISPOSABLE) ×3 IMPLANT
TRAY FOLEY CATH 16FRSI W/METER (SET/KITS/TRAYS/PACK) ×1 IMPLANT
TROCAR ADV FIXATION 5X100MM (TROCAR) ×3 IMPLANT
TUBING INSUFFLATION 10FT LAP (TUBING) ×3 IMPLANT

## 2017-01-22 NOTE — H&P (Signed)
[name]  Location: [facility] Patient #: [id number] DOB: [birth year] Single / Language: [name] / Race: White Male  Patient Care Team: [name], MD as PCP - General (Family Medicine) [name], MD as Consulting Physician (General Surgery) [name], [name], MD as Consulting Physician (Gastroenterology) [name], NP as Nurse Practitioner (Internal Medicine)   History of Present Illness The patient is a 46 year old male who presents with a hiatal hernia. Note for "Hiatal hernia": ` ` ` Patient sent for surgical consultation at the request of Dr. [name]  Chief Complaint: Recurrent hiatal hernia. Request for surgery.  The patient is a active male. History of hiatal hernia. Laparoscopic repaired in the 1990s up in [place]. He had worsening heartburn and reflux issues. He's been on an antacid medications for a long time. I pain issues and ended up having a bleeding ulcer that required emergent gastric surgery in 2012. Developed incisional hernia that required an open retrorectus repair with a 30 x 20 cm alter pro mesh in 2013. No abdominal surgery since. He's had evidence of a recurrent hiatal hernia for the past few years. He's been taking Protonix. Worsening symptoms of heartburn or reflux. Worsening symptoms of dysphagia to solids, especially meats. He is now on Protonix, Zantac, intermittent Carafate. Still struggled with heartburn and reflux. His gas was gastroenterology. Endoscopy noted irritated hiatal hernia. Larger. Surgical consultation requested. Patient referred by my partner given his impending retirement and my expertise in reoperative foregut surgery.  Patient usually moves his bowels a few times a day. Does not have issues of emesis. Some occasional bloating and fullness and nausea. He occasionally sees narcotic for some pain issues. Not constantly ago. He can walk a half hour without difficulty.  (Review of systems as  stated in this history (HPI) or in the review of systems. Otherwise all other 12 point ROS are negative)   Allergies [name], RMA; 12/01/2016 11:18 AM) Reglan *GASTROINTESTINAL AGENTS - MISC.*  HYDROmorphone HCl *ANALGESICS - OPIOID*  Allergies Reconciled   Medication History [name], RMA; 12/01/2016 11:19 AM) ClonazePAM (0.5MG  Tablet Disint, Oral) Active. Sucralfate (1GM Tablet, Oral) Active. Zolpidem Tartrate ER (12.5MG  Tablet ER, Oral) Active. Pantoprazole Sodium (40MG  Tablet DR, Oral) Active. ClonazePAM (0.5MG  Tablet, Oral) Active. Azelastine HCl (0.1% Solution, Nasal) Active. Azithromycin (250MG  Tablet, Oral) Active. Benzonatate (100MG  Capsule, Oral) Active. Doxycycline Hyclate (100MG  Tablet, Oral) Active. PredniSONE (5MG  (21) Tab Ther Pack, Oral) Active. Vitamin D (Ergocalciferol) (50000UNIT Capsule, Oral) Active. Medications Reconciled  Vitals [name] RMA; 12/01/2016 11:18 AM) 12/01/2016 11:17 AM Weight: 221.6 lb Height: 69in Body Surface Area: 2.16 m Body Mass Index: 32.72 kg/m  Temp.: 49F  Pulse: 98 (Regular)  BP: 135/72 (Sitting, Left Arm, Standard)  BP 131/71   Pulse (!) 105   Temp 98.3 F (36.8 C) (Oral)   Resp 18   Ht 5\' 9"  (1.753 m)   Wt 98 kg (216 lb)   SpO2 100%   BMI 31.90 kg/m       Physical Exam  General Mental Status-Alert. General Appearance-Not in acute distress, Not Sickly. Orientation-Oriented X3. Hydration-Well hydrated. Voice-Normal.  Integumentary Global Assessment Upon inspection and palpation of skin surfaces of the - Axillae: non-tender, no inflammation or ulceration, no drainage. and Distribution of scalp and body hair is normal. General Characteristics Temperature - normal warmth is noted.  Head and Neck Head-normocephalic, atraumatic with no lesions or palpable masses. Face Global Assessment - atraumatic, no absence of expression. Neck Global Assessment -  no  abnormal movements, no bruit auscultated on the right, no bruit auscultated on the left, no decreased range of motion, non-tender. Trachea-midline. Thyroid Gland Characteristics - non-tender.  Eye Eyeball - Left-Extraocular movements intact, No Nystagmus. Eyeball - Right-Extraocular movements intact, No Nystagmus. Cornea - Left-No Hazy. Cornea - Right-No Hazy. Sclera/Conjunctiva - Left-No scleral icterus, No Discharge. Sclera/Conjunctiva - Right-No scleral icterus, No Discharge. Pupil - Left-Direct reaction to light normal. Pupil - Right-Direct reaction to light normal.  ENMT Ears Pinna - Left - no drainage observed, no generalized tenderness observed. Right - no drainage observed, no generalized tenderness observed. Nose and Sinuses External Inspection of the Nose - no destructive lesion observed. Inspection of the nares - Left - quiet respiration. Right - quiet respiration. Mouth and Throat Lips - Upper Lip - no fissures observed, no pallor noted. Lower Lip - no fissures observed, no pallor noted. Nasopharynx - no discharge present. Oral Cavity/Oropharynx - Tongue - no dryness observed. Oral Mucosa - no cyanosis observed. Hypopharynx - no evidence of airway distress observed.  Chest and Lung Exam Inspection Movements - Normal and Symmetrical. Accessory muscles - No use of accessory muscles in breathing. Palpation Palpation of the chest reveals - Non-tender. Auscultation Breath sounds - Normal and Clear.  Cardiovascular Auscultation Rhythm - Regular. Murmurs & Other Heart Sounds - Auscultation of the heart reveals - No Murmurs and No Systolic Clicks.  Abdomen Inspection Inspection of the abdomen reveals - No Visible peristalsis and No Abnormal pulsations. Umbilicus - No Bleeding, No Urine drainage. Palpation/Percussion Palpation and Percussion of the abdomen reveal - Soft, Non Tender, No Rebound tenderness, No Rigidity (guarding) and No Cutaneous  hyperesthesia. Note: Abdomen soft. Midline incisions. Nontender. Not distended. No umbilical or incisional hernias. No guarding.   Male Genitourinary Sexual Maturity Tanner 5 - Adult hair pattern and Adult penile size and shape.  Peripheral Vascular Upper Extremity Inspection - Left - No Cyanotic nailbeds, Not Ischemic. Right - No Cyanotic nailbeds, Not Ischemic.  Neurologic Neurologic evaluation reveals -normal attention span and ability to concentrate, able to name objects and repeat phrases. Appropriate fund of knowledge , normal sensation and normal coordination. Mental Status Affect - not angry, not paranoid. Cranial Nerves-Normal Bilaterally. Gait-Normal.  Neuropsychiatric Mental status exam performed with findings of-able to articulate well with normal speech/language, rate, volume and coherence, thought content normal with ability to perform basic computations and apply abstract reasoning and no evidence of hallucinations, delusions, obsessions or homicidal/suicidal ideation.  Musculoskeletal Global Assessment Spine, Ribs and Pelvis - no instability, subluxation or laxity. Right Upper Extremity - no instability, subluxation or laxity.  Lymphatic Head & Neck  General Head & Neck Lymphatics: Bilateral - Description - No Localized lymphadenopathy. Axillary  General Axillary Region: Bilateral - Description - No Localized lymphadenopathy. Femoral & Inguinal  Generalized Femoral & Inguinal Lymphatics: Left - Description - No Localized lymphadenopathy. Right - Description - No Localized lymphadenopathy.    Assessment & Plan   HIATAL HERNIA WITH GERD (K21.9) Impression: Recurrent paraesophageal hiatal hernia with history of prior fundoplication 20 years ago. Most likely simple suturing of the diaphragm in 3 stitch Nissen. In flow not available.  It is reasonable to proceed with laparoscopic possibly robotic lysis of adhesions. Reduce the hiatal hernia. Take  down the wrap. Do endoscopy to rule out any leaks or other abnormalities. Close the hiatal hernia with pledgeted sutures with an onlay biologic Phasix mesh to minimize recurrence. Redo the wrap to centimeter complete fundoplication with anterior and posterior gastropexy.  I think from a cardiopulmonary standpoint, his operative risks are not that high. The bigger risk is esophagogastric injury, vagal nerve injury. Probably it'll be unlikely be able to spare both vagus nerves.  Normal gastric emptying study.  Manometry mostly normal  The other big challenge is his prior abdominal mesh repair. He has retrorectus ultra Pro mesh from rectus to rectus. Most likely would start with optical entry in the anterior axillary line. Probably right upper quadrant to say from the prior gastric operations. Lysis of adhesions. Robotically  Current Plans    Pt Education - [facility] Esophageal Surgery Diet HCI (Monserrath Junio): discussed with patient and provided information. Pt Education - [facility] Laparoscopic Surgery HCI The anatomy & physiology of the foregut and anti-reflux mechanism was discussed. The pathophysiology of hiatal herniation and GERD was discussed. Natural history risks without surgery was discussed. The patient's symptoms are not adequately controlled by medicines and other non-operative treatments. I feel the risks of no intervention will lead to serious problems that outweigh the operative risks; therefore, I recommended surgery to reduce the hiatal hernia out of the chest and fundoplication to rebuild the anti-reflux valve and control reflux better. Need for a thorough workup to rule out the differential diagnosis and plan treatment was explained. I explained laparoscopic techniques with possible need for an open approach.  Risks such as bleeding, infection, abscess, leak, need for further treatment, heart attack, death, and other risks were discussed. I noted a good likelihood this will help address the  problem. Goals of post-operative recovery were discussed as well. Possibility that this will not correct all symptoms was explained. Post-operative dysphagia, need for short-term liquid & pureed diet, inability to vomit, possibility of reherniation, possible need for medicines to help control symptoms in addition to surgery were discussed. We will work to minimize complications. Educational handouts further explaining the pathology, treatment options, and dysphagia diet was given as well. Questions were answered. The patient expresses understanding & wishes to proceed with surgery.

## 2017-01-22 NOTE — Op Note (Signed)
01/22/2017  2:15 PM  PATIENT:  [name]  46 y.o. male  Patient Care Team: [name], MD as PCP - General (Family Medicine) [name], MD as Consulting Physician (General Surgery) [name], [name], MD as Consulting Physician (Gastroenterology) [name], NP as Nurse Practitioner (Internal Medicine)  PRE-OPERATIVE DIAGNOSIS:  Recurrent Hiatal hernia with worsening heartburn/reflux and dysphagia  POST-OPERATIVE DIAGNOSIS:  Recurrent Hiatal hernia with worsening heartburn/reflux and dysphagia  PROCEDURE:  : 0. Laparoscopic/Robotic lysis of adhesions x 2.5 hours 1. Robotic reduction of paraesophageal hiatal hernia 2. Type II mediastinal dissection. 3. Primary repair of hiatal hernia over pledgets.  Absorbable Mesh reinforcement 4. Anterior & posterior gastropexy. 5. Nissen fundoplication 2 cm over a 56-French bougie 6.  EGD  SURGEON:  [name], MD  ASSIST:   Romana Juniper, MD (1/3 case) Ralene Ok, MD (2/3 of case)  ANESTHESIA:   local and general  EBL:  Total I/O In: 3662 [I.V.:3000; IV Piggyback:250] Out: 500 [Urine:250; Blood:250]  Delay start of Pharmacological VTE agent (>24hrs) due to surgical blood loss or risk of bleeding:  no  ANESTHESIA: 1. General anesthesia. 2. Local anesthetic in a field block around all port sites.  SPECIMEN:  Mediastinal hernia sac (not sent).  DRAINS:  A 19-French Blake drain goes from the right upper quadrant along the lesser curvature of the stomach into the mediastinum.  COUNTS:  YES  PLAN OF CARE: Admit to inpatient   PATIENT DISPOSITION:  PACU - hemodynamically stable.  INDICATION:   Patient with symptomatic paraesophageal hiatal hernia.  History of prior open repair almost 20 years ago. Developed bleeding gastric ulcer requiring laparotomy and oversew.  Developed incisional hernia requiring open repair with mesh.  The patient has had extensive work-up & we feel the patient will benefit from  repair:  The anatomy & physiology of the foregut and anti-reflux mechanism was discussed.  The pathophysiology of hiatal herniation and GERD was discussed.  Natural history risks without surgery was discussed.   The patient's symptoms are not adequately controlled by medicines and other non-operative treatments.  I feel the risks of no intervention will lead to serious problems that outweigh the operative risks; therefore, I recommended surgery to reduce the hiatal hernia out of the chest and fundoplication to rebuild the anti-reflux valve and control reflux better.  Need for a thorough workup to rule out the differential diagnosis and plan treatment was explained.  I explained laparoscopic techniques with possible need for an open approach.  Risks such as bleeding, infection, abscess, leak, need for further treatment, heart attack, death, and other risks were discussed.   I noted a good likelihood this will help address the problem.  Goals of post-operative recovery were discussed as well.  Possibility that this will not correct all symptoms was explained.  Post-operative dysphagia, need for short-term liquid & pureed diet, inability to vomit, possibility of reherniation, possible need for medicines to help control symptoms in addition to surgery were discussed.  We will work to minimize complications.   Educational handouts further explaining the pathology, treatment options, and dysphagia diet was given as well.  Questions were answered.  The patient expresses understanding & wishes to proceed with surgery.  OR FINDINGS:   Moderate-sized paraesophageal hiatal hernia with 35% of the stomach in the mediastinum.  There was a 8 x 7 cm hiatal defect.  It is a primary repair over pledgets. 15x10cm Mesh was used: Phasix Mesh (a knitted monofilament mesh scaffold using Poly-4-hydroxybutyrate (  P4HB), a biologically derived, fully resorbable material)  The patient has a 2 cm Nissen fundoplication that was  done over 56-French bougie.  The patient has had anterior and posterior gastropexy.  Good fundoplication valve on retroflexion view on EGD.  No evidence of any esophageal or gastric leak or injury.  DESCRIPTION:   Informed consent was confirmed.  The patient received IV antibiotics prior to incision.  The underwent general anesthesia without difficulty.  A Foley catheter sterilely placed.  The patient was positioned in split leg with arms tucked. The abdomen was prepped and draped in the sterile fashion.  Surgical time-out confirmed our plan.  Entry was gained in the abdomen using a Veress technique over the left subcostal ridge.  We induced carbon dioxide insufflation.I placed a robotic 8 mm port on the left subcostal ridge in the anterior axillary line to stay lateral to the extensive midline mesh.  Entry was clean.  Camera inspection revealed dense adhesions on the anterior abdominal wall the.  I was lateral to it.  No evidence of injury.  There is enough room to place another robotic port in the left abdomen.  I then did laparoscopic lysis adhesions on the anterior abdominal wall.  Used cold scissors and sharp dissection carefully.  Dense but mostly soft adhesions of small bowel greater omentum and transverse colon to the anterior abdominal wall.  Eventually created enough working space to place extra robotic and assistant ports in an arc on around the mid abdomen.  This took over an hour.  There was a regional small bowel that initially looked thinned out.  Careful inspection revealed no injury or enterotomy.  We then docked the Hollister robot.  Then proceeded to do robotic lysed adhesions with scissors.  Eventually freed up the supraumbilical adhesions off the anterior abdominal wall.  Freed adhesions of the left upper quadrant and diaphragm.  I could see the spleen.  Started mobilizing part of what appeared to be the stomach and greater omentum of adhesions of the left dome of the diaphragm to come closer  to the left crus.  Began to free the left lateral sector of the liver off the stomach.  Eventually was able to free enough off to start exposing the right crus.  There were no adhesions between the liver and the diaphragm so we needed to elevate the left lateral sector anteriorly.  Therefore we placed a [employer] operational officer.  Using omega shaped retractor to lift the left lateral sector later anteriorly to better expose esophageal hiatus.  Did more lyse adhesions eventually to identify the left and right crus.  Able to come across adhesions to the anterior esophageal hiatus.  It is apparent that he still had a moderate swath of omentum and stomach up in his mediastinum.  There was no clean sac.  Actually right posteriorly did not have as much adhesions.  Very dense anteriorly and left lateral toe.  We worked circumferentially to reduce the right mediastinal contents with hernia sac and omentum down since that was the easiest section.  And came around anteriorly.  Then came around left side to meet up with prior dissection along the left diaphragm.  Notify the crus circumferentially.  Then came around posteriorly and elevated the esophagogastric contents and freed off the posterior mediastinum.  With that I could begin to reduce some of the stomach and greater omentum down.  Gradually reduce down.  Epiphrenic pads and greater omentum tongues were skeletonized and transected off.  Eventually came to  the point where there was some moderate fat in the left mediastinum.  There seems some darkened lymph nodes making suspicious for more mediastinal fat and not really greater omentum.  I left that in place.  Eventually was able to get the stomach reduced out of the mediastinum with the help of axial tension.  Proceeded to do type II mediastinal dissection along the esophagus to mobilize 20 cm of intrathoracic esophagus and help get some intra-abdominal esophagus down.  Stomach was quite twisted upon itself.  I did  up free greater omentum on the mid lesser curvature of the stomach and following that superiorly to find some redundant stomach in the left upper quadrant.  Follow that around to make sure I came up with a curved towards the left crus.  I then was able to identify which appeared to be a slipped wrap that was more down on the proximal stomach.  Found an opening between the wrap and the cardia of the stomach.  I elevated off and confirmed that there were some Ethibond and silk sutures.  A interestingly, there was a metal washer on 1 of the stitches.  I trimmed that off and removed it.  Perhaps it was used for marking radiographically.  Nonetheless, I continued dissection to free the right side of the wrap off of the lesser curvature of the stomach and posterior cardia.  Left posterior part of the wrap was still stuck to the retroperitoneum.  I came around the left side eventually was able to elevate and skeletonized and transect the spinal attachments.  With that I completely reduce the wrap and had the stomach in a more normal anatomy.  The fundus was stretched out with an odd tongue like region.  I did more esophageal mediastinal dissection.  Felt like ice.  The posterior vagus quite well.  The anterior vagus was rather stuck in the back in the wrap and could not be spared.  With a history of prior ulcerations, I thought it probably had a secondary benefit anyway.  Further freed off anterior posterior epiphrenic pads of the cardia to better expose the esophagogastric junction.  With that I could straighten out the esophagus and get 3 cm of intra-abdominal length of the esophagus at a best estimation.  I proceeded to do upper endoscopy.  Passed down the esophagus without difficulty.  The gastric junction at 51 cm from the incisors.  The esophagus and stomach with the distal stomach clamped.  No evidence of leak or thinning or other abnormality.  I brought the fundus of the stomach posterior to the esophagus over to  the right side.  The wrap was mobile with the classic shoe shine maneuver.  Wrap became together gently.  We reflected the stomach left laterally and closed the esophageal hiatus using #1 Ethibond stitch using horizontal mattress stitches with pledgets on both sides.  I did that x3 stitches.  The crura had good substance and they came together well without any tension.  With his young age and recurrence, I decided to reinforce the hiatal hernia repair with absorbable mesh.  I used Phasix mesh.  15 x 10 cm sheet.  We cut a rectangular piece out to result in a broad U.  One third tail and two thirds tail.  A stent in the abdomen.  Laid over the crural closure.  Tails going anteriorly.  The left posterior lateral corner blade over the diaphragm between the spleen and diaphragm.  I secured the stitch using 0 Ethibond  interrupted suture at the left anterolateral corner.  Left anterior medial corner.  Right anterior lateral corner.    I reset up the and Nissen wrap and did a posterior gastropexy by taking of #1 Ethibond stitches to the posterior part of the right side of the wrap and thru the Phasix mesh & crural closure and tied that down for good posterior gastropexy up against the hiatal closure.  Help centrally tack the mesh as well.    I then did a left anterior gastropexy taking the apex of the left side of the wrap and tacked it to the left anterior crura just posterior to the phrenic vein. I tied that down.  This helped the wrap for completion.   Next, Anesthesia passed a 56-French bougie transorally.  It was a lighted bougie and we did do this under axial tension.  There was concern of resistance one point but it was backed up repositioned and then passed down down easily without resistance.    I then did a classic 2 cm Nissen fundoplication on the true esophagus above the cardia using Ethibond stitch in the left side of the wrap, then anterior esophagus, and then right side of the wrap and tied that down.  Did 3 stitches.  I released the left anterior gastropexy to be able to get an extra stitch in the esophagus to make sure that the wrap was actually on it.  I measured it and it was 2 cm in length.  We removed the bougie.  It was intact.  I repeated upper endoscopy.  Retroflexion view revealed a decent type I a fundoplication valve.  A little bit of pulled gastric mucosa.  Esophagogastric junction as noted above.  No evidence of any hiatal hernia or sliding.  Gastric mucosa not proximal to the wrap.  Not consistent with a slipped wrap.  The wrap was soft and floppy.    I did irrigation and ensured hemostasis.  We reinspected the small intestine and bowel.  There was a antimesenteric small ballooning that was difficult to determine if was a diverticulum versus perhaps serosal weakness.  I imbricated that region with 2-0 Vicryl suture using laparoscopic horizontal mattress suture to good result.  Did irrigation and reinspection of the bowel meticulously and saw no evidence any leak or perforation or other abnormality.  I placed a drain as noted above.  I removed the [facility] liver retractor under direct visualization.  I evacuated carbon dioxide and removed the ports.  Drain secured with 2-0 Prolene suture the skin was closed with Monocryl and sterile dressings applied.  The patient is being extubated and brought back to the recovery room.  I discussed postop care in detail with the patient and family in in the office.  Discussed again with the patient in the holding area.  I discussed operative findings, updated the patient's status, discussed probable steps to recovery, and gave postoperative recommendations to the Patient's brother.  Recommendations were made.  Questions were answered.  He expressed understanding & appreciation.   [name], M.D., F.A.C.S. Gastrointestinal and Minimally Invasive Surgery [facility] [address], [place],  [address] [phone] Main / Paging

## 2017-01-22 NOTE — Anesthesia Procedure Notes (Signed)
Procedure Name: Intubation Date/Time: 01/22/2017 7:43 AM Performed by: Claudia Desanctis, CRNA Pre-anesthesia Checklist: Patient identified, Emergency Drugs available, Suction available and Patient being monitored Patient Re-evaluated:Patient Re-evaluated prior to induction Oxygen Delivery Method: Circle system utilized Preoxygenation: Pre-oxygenation with 100% oxygen Induction Type: IV induction Ventilation: Mask ventilation without difficulty Laryngoscope Size: 2 and Miller Grade View: Grade I Tube type: Oral Tube size: 7.5 mm Number of attempts: 1 Airway Equipment and Method: Stylet Placement Confirmation: ETT inserted through vocal cords under direct vision,  positive ETCO2 and breath sounds checked- equal and bilateral Secured at: 23 cm Tube secured with: Tape Dental Injury: Teeth and Oropharynx as per pre-operative assessment

## 2017-01-22 NOTE — Anesthesia Postprocedure Evaluation (Signed)
Anesthesia Post Note  Patient: [name]  Procedure(s) Performed: XI ROBOTIC RECURRENT INCARCERATED PARAESOPHAGEAL HIATAL HERNIA  WITH  NISSEN FUNDOPLICATION WITH 2.5 HOURS OF LYSIS OF ADHESIONS. (N/A Abdomen) ESOPHAGOGASTRODUODENOSCOPY (EGD) (N/A Esophagus) INSERTION OF MESH (N/A Abdomen)     Patient location during evaluation: PACU Anesthesia Type: General Level of consciousness: awake and sedated Pain management: pain level controlled Vital Signs Assessment: post-procedure vital signs reviewed and stable Respiratory status: spontaneous breathing Cardiovascular status: stable Postop Assessment: no apparent nausea or vomiting Anesthetic complications: no    Last Vitals:  Vitals:   01/22/17 1530 01/22/17 1534  BP: 129/86   Pulse: 94 (!) 103  Resp: 20 (!) 36  Temp:    SpO2: 95% 94%    Last Pain:  Vitals:   01/22/17 1515  TempSrc:   PainSc: Asleep   Pain Goal: Patients Stated Pain Goal: 4 (01/22/17 0619)               Chassie Pennix JR,JOHN Mateo Flow

## 2017-01-22 NOTE — Interval H&P Note (Signed)
History and Physical Interval Note:  01/22/2017 7:13 AM   I have re-reviewed the the patient's records, history, medications, and allergies.  I have re-examined the patient.  I again discussed intraoperative plans and goals of post-operative recovery.  The patient agrees to proceed.  [name]  02-Apr-1970 [id number]  Patient Care Team: [name], MD as PCP - General (Family Medicine) [name], MD as Consulting Physician (General Surgery) [name], [name], MD as Consulting Physician (Gastroenterology) [name], NP as Nurse Practitioner (Internal Medicine)  Patient Active Problem List   Diagnosis Date Noted  . Fatigue 11/23/2016  . Gastroesophageal reflux disease   . Hiatal hernia   . Elevated blood pressure reading 12/12/2015  . Cough 07/22/2015  . Difficulty concentrating 06/20/2015  . Insomnia 05/30/2014  . Snores 05/30/2014  . Primary localized osteoarthritis of left hip 04/24/2014  . Elevated hemoglobin A1c 11/17/2012  . Erectile dysfunction 08/08/2012  . Ventral hernia 05/26/2011  . H/O: upper GI bleed 05/26/2011  . Anxiety 05/26/2011  . DJD (degenerative joint disease) 10/14/2010  . Gastric ulcer 09/29/2010    Past Medical History:  Diagnosis Date  . Anxiety    takes Xanax daily prn  . Clotting disorder ([place])   . Constipation    related to pain meds  . Dizziness    occasionally  . Erosive esophagitis   . External hemorrhoid, bleeding   . Gastric ulcer    history of  . GERD (gastroesophageal reflux disease)    takes Protonix bid  . Hiatal hernia   . History of blood clots 2012   in abdomen  . History of blood transfusion 2012  . Joint pain    knees  . Nocturia    depends on amount of fluid he drinks  . Pneumonia   . Pre-diabetes   . Primary localized osteoarthritis of left hip 04/24/2014  . Serrated polyp of colon   . Sleep apnea    no cpap  mild no cpap needed  . Upper GI bleed   . Ventral hernia     Past Surgical History:   Procedure Laterality Date  . ABDOMINAL SURGERY  10/2010   d/t bleeding ulcers; "oversewing of gastric ulcer"  . COLONOSCOPY    . ESOPHAGEAL MANOMETRY N/A 10/19/2016   Procedure: ESOPHAGEAL MANOMETRY (EM);  Surgeon: Mauri Pole, MD;  Location: [facility];  Service: Endoscopy;  Laterality: N/A;  . ESOPHAGOGASTRODUODENOSCOPY    . HERNIA REPAIR  09/04/11   incisional   . INCISIONAL HERNIA REPAIR  09/04/2011   Procedure: HERNIA REPAIR INCISIONAL;  Surgeon: Madilyn Hook, DO;  Location: [place];  Service: General;  Laterality: N/A;  debridment calcified mass  . POLYPECTOMY    . TOTAL HIP ARTHROPLASTY Left 04/24/2014   Procedure: LEFT TOTAL HIP ARTHROPLASTY;  Surgeon: Marchia Bond, MD;  Location: [place];  Service: Orthopedics;  Laterality: Left;  . UPPER GASTROINTESTINAL ENDOSCOPY      Social History   Socioeconomic History  . Marital status: Single    Spouse name: Not on file  . Number of children: 1  . Years of education: Not on file  . Highest education level: Not on file  Social Needs  . Financial resource strain: Not on file  . Food insecurity - worry: Not on file  . Food insecurity - inability: Not on file  . Transportation needs - medical: Not on file  . Transportation needs - non-medical: Not on file  Occupational History  . Occupation: Maintenance -  [employer]  Tobacco Use  . Smoking status: Never Smoker  . Smokeless tobacco: Current User    Types: Chew  . Tobacco comment: occ-Chew  Substance and Sexual Activity  . Alcohol use: Yes    Alcohol/week: 0.0 oz    Comment: 09/04/11 "very seldom"  . Drug use: No  . Sexual activity: Yes  Other Topics Concern  . Not on file  Social History Narrative  . Not on file    Family History  Problem Relation Age of Onset  . Diabetes Mother   . Liver disease Mother   . Diabetes Father   . Heart disease Father   . Colon cancer Neg Hx   . Stomach cancer Neg Hx   . Rectal cancer Neg Hx   . Esophageal cancer Neg Hx   . Colon  polyps Neg Hx     Medications Prior to Admission  Medication Sig Dispense Refill Last Dose  . acetaminophen (TYLENOL) 325 MG tablet Take 650 mg by mouth every 6 (six) hours as needed for moderate pain or headache.   Past Week at Unknown time  . clonazePAM (KLONOPIN) 0.5 MG disintegrating tablet TAKE 1 TABLET BY MOUTH TWICE A DAY AS NEEDED FOR ANXIETY (Patient taking differently: Take 0.5 mg by mouth 2 (two) times daily. ) 60 tablet 2 01/21/2017 at Unknown time  . HYDROcodone-acetaminophen (NORCO/VICODIN) 5-325 MG tablet Take 1 tablet by mouth every 6 (six) hours as needed for moderate pain. 30 tablet 0 Past Week at Unknown time  . pantoprazole (PROTONIX) 40 MG tablet TAKE 1 TABLET BY MOUTH TWICE A DAY (Patient taking differently: TAKE 40 MG BY MOUTH TWICE A DAY) 180 tablet 2 01/22/2017 at 0300  . ranitidine (ZANTAC) 150 MG tablet Take 1 tablet (150 mg total) by mouth at bedtime. (Patient taking differently: Take 150 mg by mouth daily as needed for heartburn. ) 90 tablet 3 01/21/2017 at Unknown time  . sucralfate (CARAFATE) 1 g tablet Take 1 g by mouth 2 (two) times daily with a meal.   01/21/2017 at Unknown time  . Vitamin D, Ergocalciferol, (DRISDOL) 50000 units CAPS capsule Take 1 capsule (50,000 Units total) by mouth every 7 (seven) days. For 6 weeks then recheck labs (Patient taking differently: Take 50,000 Units by mouth every Monday. ) 6 capsule 0 Past Week at Unknown time  . zolpidem (AMBIEN CR) 12.5 MG CR tablet Take 1 tablet (12.5 mg total) by mouth at bedtime as needed. for sleep (Patient taking differently: Take 12.5 mg by mouth at bedtime. ) 30 tablet 2 01/21/2017 at Unknown time    Current Facility-Administered Medications  Medication Dose Route Frequency Provider Last Rate Last Dose  . cefTRIAXone (ROCEPHIN) 2 g in dextrose 5 % 50 mL IVPB  2 g Intravenous On Call to OR [name], MD       And  . metroNIDAZOLE (FLAGYL) IVPB 500 mg  500 mg Intravenous On Call to OR [name],  MD      . dextrose 5 % with cefTRIAXone (ROCEPHIN) ADS Med              Allergies  Allergen Reactions  . Dilaudid [Hydromorphone Hcl] Rash and Other (See Comments)    Shakey  . Metoclopramide Hcl Hives, Swelling, Rash and Other (See Comments)    Swelling in lips    BP 131/71   Pulse (!) 105   Temp 98.3 F (36.8 C) (Oral)   Resp 18   Ht 5\' 9"  (1.753 m)  Wt 98 kg (216 lb)   SpO2 100%   BMI 31.90 kg/m   Labs: No results found for this or any previous visit (from the past 48 hour(s)).  Imaging / Studies: No results found.   [name], M.D., F.A.C.S. Gastrointestinal and Minimally Invasive Surgery [facility] [address], [place], [place] [address] [phone] Main / Paging  01/22/2017 7:14 AM    [name]  has presented today for surgery, with the diagnosis of Recurrent Hiatal hernia with worsening heartburn/reflux and dysphagia  The various methods of treatment have been discussed with the patient and family. After consideration of risks, benefits and other options for treatment, the patient has consented to  Procedure(s): XI ROBOTIC PARAESOPHAGEAL HIATAL HERNIA  WITH  NISSEN FUNDOPLICATION ERAS PATHWAY (N/A) ESOPHAGOGASTRODUODENOSCOPY (EGD) (N/A) INSERTION OF MESH (N/A) as a surgical intervention .  The patient's history has been reviewed, patient examined, no change in status, stable for surgery.  I have reviewed the patient's chart and labs.  Questions were answered to the patient's satisfaction.     [name]

## 2017-01-22 NOTE — Transfer of Care (Signed)
Immediate Anesthesia Transfer of Care Note  Patient: [name]  Procedure(s) Performed: XI ROBOTIC RECURRENT INCARCERATED PARAESOPHAGEAL HIATAL HERNIA  WITH  NISSEN FUNDOPLICATION WITH 2.5 HOURS OF LYSIS OF ADHESIONS. (N/A Abdomen) ESOPHAGOGASTRODUODENOSCOPY (EGD) (N/A Esophagus) INSERTION OF MESH (N/A Abdomen)  Patient Location: PACU  Anesthesia Type:General  Level of Consciousness: awake, alert , oriented and patient cooperative  Airway & Oxygen Therapy: Patient Spontanous Breathing and Patient connected to face mask  Post-op Assessment: Report given to RN and Post -op Vital signs reviewed and stable  Post vital signs: Reviewed and stable  Last Vitals:  Vitals:   01/22/17 0558  BP: 131/71  Pulse: (!) 105  Resp: 18  Temp: 36.8 C  SpO2: 100%    Last Pain:  Vitals:   01/22/17 0558  TempSrc: Oral      Patients Stated Pain Goal: 4 (19/59/74 7185)  Complications: No apparent anesthesia complications

## 2017-01-23 ENCOUNTER — Inpatient Hospital Stay (HOSPITAL_COMMUNITY): Payer: 59

## 2017-01-23 LAB — CBC
HEMATOCRIT: 34 % — AB (ref 39.0–52.0)
Hemoglobin: 11 g/dL — ABNORMAL LOW (ref 13.0–17.0)
MCH: 29.9 pg (ref 26.0–34.0)
MCHC: 32.4 g/dL (ref 30.0–36.0)
MCV: 92.4 fL (ref 78.0–100.0)
Platelets: 247 10*3/uL (ref 150–400)
RBC: 3.68 MIL/uL — AB (ref 4.22–5.81)
RDW: 13.4 % (ref 11.5–15.5)
WBC: 11.7 10*3/uL — AB (ref 4.0–10.5)

## 2017-01-23 LAB — BASIC METABOLIC PANEL
Anion gap: 6 (ref 5–15)
BUN: 15 mg/dL (ref 6–20)
CALCIUM: 8.2 mg/dL — AB (ref 8.9–10.3)
CHLORIDE: 104 mmol/L (ref 101–111)
CO2: 26 mmol/L (ref 22–32)
CREATININE: 0.83 mg/dL (ref 0.61–1.24)
GFR calc Af Amer: >60 mL/min (ref >60)
GFR calc non Af Amer: >60 mL/min (ref >60)
GLUCOSE: 100 mg/dL — AB (ref 65–99)
Potassium: 3.7 mmol/L (ref 3.5–5.1)
Sodium: 136 mmol/L (ref 135–145)

## 2017-01-23 LAB — MAGNESIUM: Magnesium: 2 mg/dL (ref 1.7–2.4)

## 2017-01-23 MED ORDER — IOPAMIDOL (ISOVUE-300) INJECTION 61%: 50.0000 mL | Freq: Once | INTRAVENOUS | Status: DC | PRN

## 2017-01-23 MED ORDER — IOPAMIDOL (ISOVUE-300) INJECTION 61%
INTRAVENOUS | Status: AC
  Administered 2017-01-23: 50 mL via ORAL
  Filled 2017-01-23: qty 100

## 2017-01-23 NOTE — Progress Notes (Signed)
1 Day Post-Op   Subjective/Chief Complaint: Pain well controlled. No nausea. Tolerating ice chips.    Objective: Vital signs in last 24 hours: Temp:  [97.8 F (36.6 C)-99.2 F (37.3 C)] 99.1 F (37.3 C) (12/22 0630) Pulse Rate:  [87-108] 87 (12/22 0630) Resp:  [15-36] 16 (12/22 0630) BP: (112-147)/(74-87) 126/74 (12/22 0630) SpO2:  [88 %-95 %] 94 % (12/22 0630) Last BM Date: 01/21/17  Intake/Output from previous day: 12/21 0701 - 12/22 0700 In: 4167.2 [P.O.:240; I.V.:3677.2; IV Piggyback:250] Out: 2560 [IRCVE:9381; Drains:360; Blood:250] Intake/Output this shift: No intake/output data recorded.  General appearance: alert and cooperative Resp: clear to auscultation bilaterally GI: soft, appropriately tender, nondistended. JP with serosanguinous output Skin: Skin color, texture, turgor normal. No rashes or lesions Neurologic: Grossly normal Incision/Wound: or dressings dry  Lab Results:  Recent Labs    01/23/17 0450  WBC 11.7*  HGB 11.0*  HCT 34.0*  PLT 247   BMET Recent Labs    01/23/17 0450  NA 136  K 3.7  CL 104  CO2 26  GLUCOSE 100*  BUN 15  CREATININE 0.83  CALCIUM 8.2*   PT/INR No results for input(s): LABPROT, INR in the last 72 hours. ABG No results for input(s): PHART, HCO3 in the last 72 hours.  Invalid input(s): PCO2, PO2  Studies/Results: Dg Chest Port 1 View  Result Date: 01/22/2017 CLINICAL DATA:  Hypoxia following surgery today. EXAM: PORTABLE CHEST 1 VIEW COMPARISON:  12/18/2015.  Chest CTA dated 12/19/2015. FINDINGS: Normal sized heart. Linear density at the left lung base without significant change. Clear right lung. Mild bilateral glenohumeral joint degenerative changes. IMPRESSION: Stable left basilar linear atelectasis/scarring. No acute abnormality. Electronically Signed   By: Claudie Revering M.D.   On: 01/22/2017 15:16    Anti-infectives: Anti-infectives (From admission, onward)   Start     Dose/Rate Route Frequency Ordered Stop    01/22/17 0700  dextrose 5 % with cefTRIAXone (ROCEPHIN) ADS Med    Comments:  Waldron Session   : cabinet override      01/22/17 0700 01/22/17 0739   01/22/17 0638  ceFAZolin (ANCEF) 2-4 GM/100ML-% IVPB  Status:  Discontinued    Comments:  [place], Ron   : cabinet override      01/22/17 0638 01/22/17 0700   01/22/17 0606  cefTRIAXone (ROCEPHIN) 2 g in dextrose 5 % 50 mL IVPB     2 g 100 mL/hr over 30 Minutes Intravenous On call to O.R. 01/22/17 0606 01/22/17 0749   01/22/17 0606  metroNIDAZOLE (FLAGYL) IVPB 500 mg     500 mg 100 mL/hr over 60 Minutes Intravenous On call to O.R. 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 (N/A) - ERAS PATHWAY ESOPHAGOGASTRODUODENOSCOPY (EGD) (N/A) INSERTION OF MESH (N/A) Continue clear liquids for now. Esophagram today. Puree diet if it looks OK. Ambulate. Pulm toilet. Remove foley.  LOS: 1 day    Clovis Riley 01/23/2017

## 2017-01-23 NOTE — Progress Notes (Signed)
Paged on call MD concerning about patients JP output. Got a call back from Dr. Chrissie Noa that they will going to check the patient  During their rounds this morning,no new orders was given . We will continue to monitor

## 2017-01-24 LAB — CBC
HCT: 36.9 % — ABNORMAL LOW (ref 39.0–52.0)
Hemoglobin: 12.1 g/dL — ABNORMAL LOW (ref 13.0–17.0)
MCH: 30.7 pg (ref 26.0–34.0)
MCHC: 32.8 g/dL (ref 30.0–36.0)
MCV: 93.7 fL (ref 78.0–100.0)
PLATELETS: 248 10*3/uL (ref 150–400)
RBC: 3.94 MIL/uL — ABNORMAL LOW (ref 4.22–5.81)
RDW: 13.5 % (ref 11.5–15.5)
WBC: 13.6 10*3/uL — ABNORMAL HIGH (ref 4.0–10.5)

## 2017-01-24 LAB — BASIC METABOLIC PANEL
Anion gap: 6 (ref 5–15)
BUN: 13 mg/dL (ref 6–20)
CALCIUM: 8.1 mg/dL — AB (ref 8.9–10.3)
CHLORIDE: 104 mmol/L (ref 101–111)
CO2: 27 mmol/L (ref 22–32)
CREATININE: 0.92 mg/dL (ref 0.61–1.24)
GFR calc Af Amer: >60 mL/min (ref >60)
Glucose, Bld: 103 mg/dL — ABNORMAL HIGH (ref 65–99)
Potassium: 3.7 mmol/L (ref 3.5–5.1)
SODIUM: 137 mmol/L (ref 135–145)

## 2017-01-24 LAB — MAGNESIUM: MAGNESIUM: 1.9 mg/dL (ref 1.7–2.4)

## 2017-01-24 MED ORDER — ACETAMINOPHEN 160 MG/5ML PO SOLN: 650.0000 mg | Freq: Four times a day (QID) | ORAL | Status: DC | PRN

## 2017-01-24 MED ORDER — HYDROCODONE-ACETAMINOPHEN 7.5-325 MG/15ML PO SOLN
10.0000 mL | ORAL | Status: DC | PRN
  Administered 2017-01-24 – 2017-01-26 (×10): 10 mL via ORAL
  Filled 2017-01-24 (×10): qty 15

## 2017-01-24 NOTE — Progress Notes (Signed)
2 Days Post-Op   Subjective/Chief Complaint: Pain well controlled. No nausea. Tolerated liquids without dysphagia, nausea or bloating. Passing flatus, no bowel movement yet. Walking to bathroom.    Objective: Vital signs in last 24 hours: Temp:  [98.8 F (37.1 C)-99 F (37.2 C)] 98.8 F (37.1 C) (12/23 0559) Pulse Rate:  [84-88] 88 (12/23 0559) Resp:  [16] 16 (12/23 0559) BP: (130-143)/(75-80) 143/80 (12/23 0559) SpO2:  [91 %-92 %] 92 % (12/23 0559) Last BM Date: 01/21/17  Intake/Output from previous day: 12/22 0701 - 12/23 0700 In: 1582.2 [P.O.:600; I.V.:982.2] Out: 760 [Urine:450; Drains:310]  JP output slowing.   Intake/Output this shift: No intake/output data recorded.  General appearance: alert and cooperative Resp: clear to auscultation bilaterally GI: soft, appropriately tender, nondistended. JP with serosanguinous output Skin: Skin color, texture, turgor normal. No rashes or lesions Neurologic: Grossly normal Incision/Wound: or dressings dry  Lab Results:  Recent Labs    01/23/17 0450 01/24/17 0417  WBC 11.7* 13.6*  HGB 11.0* 12.1*  HCT 34.0* 36.9*  PLT 247 248   BMET Recent Labs    01/23/17 0450 01/24/17 0417  NA 136 137  K 3.7 3.7  CL 104 104  CO2 26 27  GLUCOSE 100* 103*  BUN 15 13  CREATININE 0.83 0.92  CALCIUM 8.2* 8.1*   PT/INR No results for input(s): LABPROT, INR in the last 72 hours. ABG No results for input(s): PHART, HCO3 in the last 72 hours.  Invalid input(s): PCO2, PO2  Studies/Results: Dg Chest Port 1 View  Result Date: 01/22/2017 CLINICAL DATA:  Hypoxia following surgery today. EXAM: PORTABLE CHEST 1 VIEW COMPARISON:  12/18/2015.  Chest CTA dated 12/19/2015. FINDINGS: Normal sized heart. Linear density at the left lung base without significant change. Clear right lung. Mild bilateral glenohumeral joint degenerative changes. IMPRESSION: Stable left basilar linear atelectasis/scarring. No acute abnormality. Electronically  Signed   By: Claudie Revering M.D.   On: 01/22/2017 15:16   Dg Esophagus W/water Sol Cm  Result Date: 01/23/2017 CLINICAL DATA:  One day status post reduction of paraesophageal hiatal hernia, gastropexy and Nissen fundoplication. EXAM: ESOPHOGRAM/BARIUM SWALLOW TECHNIQUE: Single contrast examination was performed using water-soluble contrast. FLUOROSCOPY TIME:  Fluoroscopy Time:  1 minutes and 18 seconds. Radiation Exposure Index (if provided by the fluoroscopic device): 31 mGy. COMPARISON:  None. FINDINGS: In the near upright position, water soluble contrast passes through the esophagus normally. The Nissen wrap is open. Surgical drain is present in the lower mediastinum. No evidence of leakage of contrast material. Contrast enters the stomach normally. No reflux identified. IMPRESSION: Unremarkable postoperative study. No evidence of contrast leak or obstruction. Electronically Signed   By: Aletta Edouard M.D.   On: 01/23/2017 11:09    Anti-infectives: Anti-infectives (From admission, onward)   Start     Dose/Rate Route Frequency Ordered Stop   01/22/17 0700  dextrose 5 % with cefTRIAXone (ROCEPHIN) ADS Med    Comments:  Waldron Session   : cabinet override      01/22/17 0700 01/22/17 0739   01/22/17 0638  ceFAZolin (ANCEF) 2-4 GM/100ML-% IVPB  Status:  Discontinued    Comments:  [place], Ron   : cabinet override      01/22/17 0638 01/22/17 0700   01/22/17 0606  cefTRIAXone (ROCEPHIN) 2 g in dextrose 5 % 50 mL IVPB     2 g 100 mL/hr over 30 Minutes Intravenous On call to O.R. 01/22/17 0606 01/22/17 0749   01/22/17 0606  metroNIDAZOLE (FLAGYL) IVPB 500 mg  500 mg 100 mL/hr over 60 Minutes Intravenous On call to O.R. 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 (N/A) - ERAS PATHWAY ESOPHAGOGASTRODUODENOSCOPY (EGD)  (N/A) INSERTION OF MESH (N/A) Puree diet, heplock IVs Incr WBC, afebrile- recheck labs in Am Ambulate in halls  Plan discharge tomorrow.     LOS: 2 days    [name] 01/24/2017

## 2017-01-24 NOTE — Progress Notes (Signed)
Pt had recent desat to 77% while asleep in bed on room air. 2 liters O2 applied via [place]. Saturation levels climbed to 91%. Pt awakened and encouraged to get into chair. Pt stated "Feels good sitting up."  Lunch served. Reassurance given. Denies any lightheadedness or dizziness at this time.

## 2017-01-25 ENCOUNTER — Inpatient Hospital Stay (HOSPITAL_COMMUNITY): Payer: 59

## 2017-01-25 LAB — CBC
HCT: 37.1 % — ABNORMAL LOW (ref 39.0–52.0)
Hemoglobin: 12.3 g/dL — ABNORMAL LOW (ref 13.0–17.0)
MCH: 30.8 pg (ref 26.0–34.0)
MCHC: 33.2 g/dL (ref 30.0–36.0)
MCV: 93 fL (ref 78.0–100.0)
PLATELETS: 237 10*3/uL (ref 150–400)
RBC: 3.99 MIL/uL — AB (ref 4.22–5.81)
RDW: 13.2 % (ref 11.5–15.5)
WBC: 11.5 10*3/uL — ABNORMAL HIGH (ref 4.0–10.5)

## 2017-01-25 LAB — BASIC METABOLIC PANEL
Anion gap: 5 (ref 5–15)
BUN: 11 mg/dL (ref 6–20)
CO2: 27 mmol/L (ref 22–32)
CREATININE: 0.85 mg/dL (ref 0.61–1.24)
Calcium: 8.5 mg/dL — ABNORMAL LOW (ref 8.9–10.3)
Chloride: 104 mmol/L (ref 101–111)
GFR calc Af Amer: >60 mL/min (ref >60)
GLUCOSE: 110 mg/dL — AB (ref 65–99)
POTASSIUM: 3.8 mmol/L (ref 3.5–5.1)
SODIUM: 136 mmol/L (ref 135–145)

## 2017-01-25 NOTE — Progress Notes (Signed)
3 Days Post-Op   Subjective/Chief Complaint: Pt with occasional CP not with exertion or SOB    Objective: Vital signs in last 24 hours: Temp:  [98.8 F (37.1 C)-98.9 F (37.2 C)] 98.8 F (37.1 C) (12/24 8250) Pulse Rate:  [88-107] 88 (12/24 0613) Resp:  [16-18] 16 (12/24 0613) BP: (132-136)/(78-90) 132/89 (12/24 0613) SpO2:  [91 %-93 %] 92 % (12/24 0613) Last BM Date: 01/21/17  Intake/Output from previous day: 12/23 0701 - 12/24 0700 In: -  Out: 15 [Drains:15] Intake/Output this shift: No intake/output data recorded.  Resp: clear to auscultation bilaterally Cardio: regular rate and rhythm, S1, S2 normal, no murmur, click, rub or gallop Incision/Wound:CDI JP serous   Lab Results:  Recent Labs    01/24/17 0417 01/25/17 0413  WBC 13.6* 11.5*  HGB 12.1* 12.3*  HCT 36.9* 37.1*  PLT 248 237   BMET Recent Labs    01/24/17 0417 01/25/17 0413  NA 137 136  K 3.7 3.8  CL 104 104  CO2 27 27  GLUCOSE 103* 110*  BUN 13 11  CREATININE 0.92 0.85  CALCIUM 8.1* 8.5*   PT/INR No results for input(s): LABPROT, INR in the last 72 hours. ABG No results for input(s): PHART, HCO3 in the last 72 hours.  Invalid input(s): PCO2, PO2  Studies/Results: Dg Esophagus W/water Sol Cm  Result Date: 01/23/2017 CLINICAL DATA:  One day status post reduction of paraesophageal hiatal hernia, gastropexy and Nissen fundoplication. EXAM: ESOPHOGRAM/BARIUM SWALLOW TECHNIQUE: Single contrast examination was performed using water-soluble contrast. FLUOROSCOPY TIME:  Fluoroscopy Time:  1 minutes and 18 seconds. Radiation Exposure Index (if provided by the fluoroscopic device): 31 mGy. COMPARISON:  None. FINDINGS: In the near upright position, water soluble contrast passes through the esophagus normally. The Nissen wrap is open. Surgical drain is present in the lower mediastinum. No evidence of leakage of contrast material. Contrast enters the stomach normally. No reflux identified. IMPRESSION:  Unremarkable postoperative study. No evidence of contrast leak or obstruction. Electronically Signed   By: Aletta Edouard M.D.   On: 01/23/2017 11:09    Anti-infectives: Anti-infectives (From admission, onward)   Start     Dose/Rate Route Frequency Ordered Stop   01/22/17 0700  dextrose 5 % with cefTRIAXone (ROCEPHIN) ADS Med    Comments:  Waldron Session   : cabinet override      01/22/17 0700 01/22/17 0739   01/22/17 0638  ceFAZolin (ANCEF) 2-4 GM/100ML-% IVPB  Status:  Discontinued    Comments:  [place], Ron   : cabinet override      01/22/17 0638 01/22/17 0700   01/22/17 0606  cefTRIAXone (ROCEPHIN) 2 g in dextrose 5 % 50 mL IVPB     2 g 100 mL/hr over 30 Minutes Intravenous On call to O.R. 01/22/17 0606 01/22/17 0749   01/22/17 0606  metroNIDAZOLE (FLAGYL) IVPB 500 mg     500 mg 100 mL/hr over 60 Minutes Intravenous On call to O.R. 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

## 2017-01-26 LAB — CBC
HEMATOCRIT: 38.4 % — AB (ref 39.0–52.0)
Hemoglobin: 12.6 g/dL — ABNORMAL LOW (ref 13.0–17.0)
MCH: 30.3 pg (ref 26.0–34.0)
MCHC: 32.8 g/dL (ref 30.0–36.0)
MCV: 92.3 fL (ref 78.0–100.0)
PLATELETS: 276 10*3/uL (ref 150–400)
RBC: 4.16 MIL/uL — ABNORMAL LOW (ref 4.22–5.81)
RDW: 13.2 % (ref 11.5–15.5)
WBC: 11 10*3/uL — ABNORMAL HIGH (ref 4.0–10.5)

## 2017-01-26 NOTE — Discharge Instructions (Signed)
EATING AFTER YOUR ESOPHAGEAL SURGERY (Stomach Fundoplication, Hiatal Hernia repair, Achalasia surgery, etc)  ######################################################################  EAT Start with a pureed / full liquid diet (see below) Gradually transition to a high fiber diet with a fiber supplement over the next month after discharge.    WALK Walk an hour a day.  Control your pain to do that.    CONTROL PAIN Control pain so that you can walk, sleep, tolerate sneezing/coughing, go up/down stairs.  HAVE A BOWEL MOVEMENT DAILY Keep your bowels regular to avoid problems.  OK to try a laxative to override constipation.  OK to use an antidairrheal to slow down diarrhea.  Call if not better after 2 tries  CALL IF YOU HAVE PROBLEMS/CONCERNS Call if you are still struggling despite following these instructions. Call if you have concerns not answered by these instructions  ######################################################################   After your esophageal surgery, expect some sticking with swallowing over the next 1-2 months.    If food sticks when you eat, it is called "dysphagia".  This is due to swelling around your esophagus at the wrap & hiatal diaphragm repair.  It will gradually ease off over the next few months.  To help you through this temporary phase, we start you out on a pureed (blenderized) diet.  Your first meal in the hospital was thin liquids.  You should have been given a pureed diet by the time you left the hospital.  We ask patients to stay on a pureed diet for the first 2-3 weeks to avoid anything getting "stuck" near your recent surgery.  Don't be alarmed if your ability to swallow doesn't progress according to this plan.  Everyone is different and some diets can advance more or less quickly.     Some BASIC RULES to follow are:  Maintain an upright position whenever eating or drinking.  Take small bites - just a teaspoon size bite at a time.  Eat slowly.   It may also help to eat only one food at a time.  Consider nibbling through smaller, more frequent meals & avoid the urge to eat BIG meals  Do not push through feelings of fullness, nausea, or bloatedness  Do not mix solid foods and liquids in the same mouthful  Try not to "wash foods down" with large gulps of liquids.  Avoid carbonated (bubbly/fizzy) drinks.    Avoid foods that make you feel gassy or bloated.  Start with bland foods first.  Wait on trying greasy, fried, or spicy meals until you are tolerating more bland solids well.  Understand that it will be hard to burp and belch at first.  This gradually improves with time.  Expect to be more gassy/flatulent/bloated initially.  Walking will help your body manage it better.  Consider using medications for bloating that contain simethicone such as  Maalox or Gas-X   Eat in a relaxed atmosphere & minimize distractions.  Avoid talking while eating.    Do not use straws.  Following each meal, sit in an upright position (90 degree angle) for 60 to 90 minutes.  Going for a short walk can help as well  If food does stick, don't panic.  Try to relax and let the food pass on its own.  Sipping WARM LIQUID such as strong hot black tea can also help slide it down.   Be gradual in changes & use common sense:  -If you easily tolerating a certain "level" of foods, advance to the next level gradually -If you are  having trouble swallowing a particular food, then avoid it.   -If food is sticking when you advance your diet, go back to thinner previous diet (the lower LEVEL) for 1-2 days.  LEVEL 1 = PUREED DIET  Do for the first 2 WEEKS AFTER SURGERY  -Foods in this group are pureed or blenderized to a smooth, mashed potato-like consistency.  -If necessary, the pureed foods can keep their shape with the addition of a thickening agent.   -Meat should be pureed to a smooth, pasty consistency.  Hot broth or gravy may be added to the pureed  meat, approximately 1 oz. of liquid per 3 oz. serving of meat. -CAUTION:  If any foods do not puree into a smooth consistency, swallowing will be more difficult.  (For example, nuts or seeds sometimes do not blend well.)  Hot Foods Cold Foods  Pureed scrambled eggs and cheese Pureed cottage cheese  Baby cereals Thickened juices and nectars  Thinned cooked cereals (no lumps) Thickened milk or eggnog  Pureed [place] toast or pancakes Ensure  Mashed potatoes Ice cream  Pureed parsley, au gratin, scalloped potatoes, candied sweet potatoes Fruit or [place] ice, sherbet  Pureed buttered or alfredo noodles Plain yogurt  Pureed vegetables (no corn or peas) Instant breakfast  Pureed soups and creamed soups Smooth pudding, mousse, custard  Pureed scalloped apples Whipped gelatin  Gravies Sugar, syrup, honey, jelly  Sauces, cheese, tomato, barbecue, white, creamed Cream  Any baby food Creamer  Alcohol in moderation (not beer or champagne) Margarine  Coffee or tea Mayonnaise   Ketchup, mustard   Apple sauce   SAMPLE MENU:  PUREED DIET Breakfast Lunch Dinner   Orange juice, 1/2 cup  Cream of wheat, 1/2 cup  Pineapple juice, 1/2 cup  Pureed [place], barley soup, 3/4 cup  Pureed Hawaiian chicken, 3 oz   Scrambled eggs, mashed or blended with cheese, 1/2 cup  Tea or coffee, 1 cup   Whole milk, 1 cup   Non-dairy creamer, 2 Tbsp.  Mashed potatoes, 1/2 cup  Pureed cooled broccoli, 1/2 cup  Apple sauce, 1/2 cup  Coffee or tea  Mashed potatoes, 1/2 cup  Pureed spinach, 1/2 cup  Frozen yogurt, 1/2 cup  Tea or coffee      LEVEL 2 = SOFT DIET  After your first 2 weeks, you can advance to a soft diet.   Keep on this diet until everything goes down easily.  Hot Foods Cold Foods  White fish Cottage cheese  Stuffed fish Junior baby fruit  Baby food meals Semi thickened juices  Minced soft cooked, scrambled, poached eggs nectars  Souffle & omelets Ripe mashed bananas  Cooked  cereals Canned fruit, pineapple sauce, milk  potatoes Milkshake  Buttered or Alfredo noodles Custard  Cooked cooled vegetable Puddings, including tapioca  Sherbet Yogurt  Vegetable soup or alphabet soup Fruit ice, [place] ice  Gravies Whipped gelatin  Sugar, syrup, honey, jelly Junior baby desserts  Sauces:  Cheese, creamed, barbecue, tomato, white Cream  Coffee or tea Margarine   SAMPLE MENU:  LEVEL 2 Breakfast Lunch Dinner   Orange juice, 1/2 cup  Oatmeal, 1/2 cup  Scrambled eggs with cheese, 1/2 cup  Decaffeinated tea, 1 cup  Whole milk, 1 cup  Non-dairy creamer, 2 Tbsp  Pineapple juice, 1/2 cup  Minced beef, 3 oz  Gravy, 2 Tbsp  Mashed potatoes, 1/2 cup  Minced fresh broccoli, 1/2 cup  Applesauce, 1/2 cup  Coffee, 1 cup  [place], barley soup, 3/4 cup  Minced Hawaiian chicken, 3 oz  Mashed potatoes, 1/2 cup  Cooked spinach, 1/2 cup  Frozen yogurt, 1/2 cup  Non-dairy creamer, 2 Tbsp      LEVEL 3 = CHOPPED DIET  -After all the foods in level 2 (soft diet) are passing through well you should advance up to more chopped foods.  -It is still important to cut these foods into small pieces and eat slowly.  Hot Foods Cold Foods  Poultry Cottage cheese  Chopped Swedish meatballs Yogurt  Meat salads (ground or flaked meat) Milk  Flaked fish (tuna) Milkshakes  Poached or scrambled eggs Soft, cold, dry cereal  Souffles and omelets Fruit juices or nectars  Cooked cereals Chopped canned fruit  Chopped [place] toast or pancakes Canned fruit cocktail  Noodles or pasta (no rice) Pudding, mousse, custard  Cooked vegetables (no frozen peas, corn, or mixed vegetables) Green salad  Canned small sweet peas Ice cream  Creamed soup or vegetable soup Fruit ice, [place] ice  Pureed vegetable soup or alphabet soup Non-dairy creamer  Ground scalloped apples Margarine  Gravies Mayonnaise  Sauces:  Cheese, creamed, barbecue, tomato, white Ketchup  Coffee or tea Mustard    SAMPLE MENU:  LEVEL 3 Breakfast Lunch Dinner   Orange juice, 1/2 cup  Oatmeal, 1/2 cup  Scrambled eggs with cheese, 1/2 cup  Decaffeinated tea, 1 cup  Whole milk, 1 cup  Non-dairy creamer, 2 Tbsp  Ketchup, 1 Tbsp  Margarine, 1 tsp  Salt, 1/4 tsp  Sugar, 2 tsp  Pineapple juice, 1/2 cup  Ground beef, 3 oz  Gravy, 2 Tbsp  Mashed potatoes, 1/2 cup  Cooked spinach, 1/2 cup  Applesauce, 1/2 cup  Decaffeinated coffee  Whole milk  Non-dairy creamer, 2 Tbsp  Margarine, 1 tsp  Salt, 1/4 tsp  Pureed [place], barley soup, 3/4 cup  Barbecue chicken, 3 oz  Mashed potatoes, 1/2 cup  Ground fresh broccoli, 1/2 cup  Frozen yogurt, 1/2 cup  Decaffeinated tea, 1 cup  Non-dairy creamer, 2 Tbsp  Margarine, 1 tsp  Salt, 1/4 tsp  Sugar, 1 tsp    LEVEL 4:  REGULAR FOODS  -Foods in this group are soft, moist, regularly textured foods.   -This level includes meat and breads, which tend to be the hardest things to swallow.   -Eat very slowly, chew well and continue to avoid carbonated drinks. -most people are at this level in 4-6 weeks  Hot Foods Cold Foods  Baked fish or skinned Soft cheeses - cottage cheese  Souffles and omelets Cream cheese  Eggs Yogurt  Stuffed shells Milk  Spaghetti with meat sauce Milkshakes  Cooked cereal Cold dry cereals (no nuts, dried fruit, coconut)  [place] toast or pancakes Crackers  Buttered toast Fruit juices or nectars  Noodles or pasta (no rice) Canned fruit  Potatoes (all types) Ripe bananas  Soft, cooked vegetables (no corn, lima, or baked beans) Peeled, ripe, fresh fruit  Creamed soups or vegetable soup Cakes (no nuts, dried fruit, coconut)  Canned chicken noodle soup Plain doughnuts  Gravies Ice cream  Bacon dressing Pudding, mousse, custard  Sauces:  Cheese, creamed, barbecue, tomato, white Fruit ice, [place] ice, sherbet  Decaffeinated tea or coffee Whipped gelatin  Pork chops Regular gelatin   Canned fruited  gelatin molds   Sugar, syrup, honey, jam, jelly   Cream   Non-dairy   Margarine   Oil   Mayonnaise   Ketchup   Mustard   TROUBLESHOOTING IRREGULAR BOWELS  1) Avoid extremes of bowel  movements (no bad constipation/diarrhea)  °2) Miralax 17gm mixed in 8oz. water or juice-daily. May use BID as needed.  °3) Gas-x,Phazyme, etc. as needed for gas & bloating.  °4) Soft,bland diet. No spicy,greasy,fried foods.  °5) Prilosec over-the-counter as needed  °6) May hold gluten/wheat products from diet to see if symptoms improve.  °7) May try probiotics (Align, Activa, etc) to help calm the bowels down  °7) If symptoms become worse call back immediately. ° ° ° °If you have any questions please call our office at [facility]: [phone]. ° °LAPAROSCOPIC SURGERY: POST OP INSTRUCTIONS ° °###################################################################### ° °EAT °Gradually transition to a high fiber diet with a fiber supplement over the next few weeks after discharge.  Start with a pureed / full liquid diet (see below) ° °WALK °Walk an hour a day.  Control your pain to do that.   ° °CONTROL PAIN °Control pain so that you can walk, sleep, tolerate sneezing/coughing, go up/down stairs. ° °HAVE A BOWEL MOVEMENT DAILY °Keep your bowels regular to avoid problems.  OK to try a laxative to override constipation.  OK to use an antidairrheal to slow down diarrhea.  Call if not better after 2 tries ° °CALL IF YOU HAVE PROBLEMS/CONCERNS °Call if you are still struggling despite following these instructions. °Call if you have concerns not answered by these instructions ° °###################################################################### ° ° ° °1. DIET: Follow a light bland diet the first 24 hours after arrival home, such as soup, liquids, crackers, etc.  Be sure to include lots of fluids daily.  Avoid fast food or heavy meals as your are more likely to get nauseated.  Eat a low fat the next few days after  surgery.   °2. Take your usually prescribed home medications unless otherwise directed. °3. PAIN CONTROL: °a. Pain is best controlled by a usual combination of three different methods TOGETHER: °i. Ice/Heat °ii. Over the counter pain medication °iii. Prescription pain medication °b. Most patients will experience some swelling and bruising around the incisions.  Ice packs or heating pads (30-60 minutes up to 6 times a day) will help. Use ice for the first few days to help decrease swelling and bruising, then switch to heat to help relax tight/sore spots and speed recovery.  Some people prefer to use ice alone, heat alone, alternating between ice & heat.  Experiment to what works for you.  Swelling and bruising can take several weeks to resolve.   °c. It is helpful to take an over-the-counter pain medication regularly for the first few weeks.  Choose one of the following that works best for you: °i. Naproxen (Aleve, etc)  Two 220mg tabs twice a day °ii. Ibuprofen (Advil, etc) Three 200mg tabs four times a day (every meal & bedtime) °iii. Acetaminophen (Tylenol, etc) 500-650mg four times a day (every meal & bedtime) °d. A  prescription for pain medication (such as oxycodone, hydrocodone, etc) should be given to you upon discharge.  Take your pain medication as prescribed.  °i. If you are having problems/concerns with the prescription medicine (does not control pain, nausea, vomiting, rash, itching, etc), please call us [phone] to see if we need to switch you to a different pain medicine that will work better for you and/or control your side effect better. °ii. If you need a refill on your pain medication, please contact your pharmacy.  They will contact our office to request authorization. Prescriptions will not be filled after 5 pm or on week-ends. °4. Avoid getting   constipated.  Between the surgery and the pain medications, it is common to experience some constipation.  Increasing fluid intake and taking a  fiber supplement (such as Metamucil, Citrucel, FiberCon, MiraLax, etc) 1-2 times a day regularly will usually help prevent this problem from occurring.  A mild laxative (prune juice, Milk of Magnesia, MiraLax, etc) should be taken according to package directions if there are no bowel movements after 48 hours.   °5. Watch out for diarrhea.  If you have many loose bowel movements, simplify your diet to bland foods & liquids for a few days.  Stop any stool softeners and decrease your fiber supplement.  Switching to mild anti-diarrheal medications (Kayopectate, Pepto Bismol) can help.  If this worsens or does not improve, please call us. °6. Wash / shower every day.  You may shower over the dressings as they are waterproof.  Continue to shower over incision(s) after the dressing is off. °7. Remove your waterproof bandages 5 days after surgery.  You may leave the incision open to air.  You may replace a dressing/Band-Aid to cover the incision for comfort if you wish.  °8. ACTIVITIES as tolerated:   °a. You may resume regular (light) daily activities beginning the next day--such as daily self-care, walking, climbing stairs--gradually increasing activities as tolerated.  If you can walk 30 minutes without difficulty, it is safe to try more intense activity such as jogging, treadmill, bicycling, low-impact aerobics, swimming, etc. °b. Save the most intensive and strenuous activity for last such as sit-ups, heavy lifting, contact sports, etc  Refrain from any heavy lifting or straining until you are off narcotics for pain control.   °c. DO NOT PUSH THROUGH PAIN.  Let pain be your guide: If it hurts to do something, don't do it.  Pain is your body warning you to avoid that activity for another week until the pain goes down. °d. You may drive when you are no longer taking prescription pain medication, you can comfortably wear a seatbelt, and you can safely maneuver your car and apply brakes. °e. You may have sexual intercourse  when it is comfortable.  °9. FOLLOW UP in our office °a. Please call [facility] at [phone] to set up an appointment to see your surgeon in the office for a follow-up appointment approximately 2-3 weeks after your surgery. °b. Make sure that you call for this appointment the day you arrive home to insure a convenient appointment time. °10. IF YOU HAVE DISABILITY OR FAMILY LEAVE FORMS, BRING THEM TO THE OFFICE FOR PROCESSING.  DO NOT GIVE THEM TO YOUR DOCTOR. ° ° °WHEN TO CALL US [phone]: °1. Poor pain control °2. Reactions / problems with new medications (rash/itching, nausea, etc)  °3. Fever over 101.5 F (38.5 C) °4. Inability to urinate °5. Nausea and/or vomiting °6. Worsening swelling or bruising °7. Continued bleeding from incision. °8. Increased pain, redness, or drainage from the incision ° ° The clinic staff is available to answer your questions during regular business hours (8:30am-5pm).  Please don’t hesitate to call and ask to speak to one of our nurses for clinical concerns.  ° If you have a medical emergency, go to the nearest emergency room or call 911. ° A surgeon from [facility] is always on call at the hospitals ° ° °[facility] °[address], [address], [place], [place]  [address] ? °MAIN: [phone] ? TOLL FREE: [phone] ?  °FAX [phone] °[email or web address] ° ° °

## 2017-01-26 NOTE — Progress Notes (Signed)
Pt was given discharge instructions and prescriptions. Patient had no questions but requested to shower before leaving. Pt showered and was wheeled to main entrance via wheelchair.

## 2017-01-27 ENCOUNTER — Other Ambulatory Visit: Payer: Self-pay | Admitting: *Deleted

## 2017-01-27 ENCOUNTER — Encounter (HOSPITAL_COMMUNITY): Payer: Self-pay | Admitting: Surgery

## 2017-01-27 NOTE — Discharge Summary (Signed)
Patient ID: [name] [id number] y.o. 24-Feb-1970  01/22/2017  Discharge date and time: 01/26/2017  2:09 PM  Admitting Physician: Dr Johney Maine  Discharge Physician: Rosario Adie.  Admission Diagnoses: Recurrent Hiatal hernia with worsening heartburn reflux and dysphagia  Discharge Diagnoses: same  Operations:  XI ROBOTIC RECURRENT INCARCERATED PARAESOPHAGEAL HIATAL HERNIA  WITH  NISSEN FUNDOPLICATION WITH 2.5 HOURS OF LYSIS OF ADHESIONS. ESOPHAGOGASTRODUODENOSCOPY (EGD) INSERTION OF MESH    Discharged Condition: good    Hospital Course: Pt was recovered as an inpatient after surgery.  Swallow eval showed no leak after surgery but some swelling and stricturing.  This improved with steroids and time.  By POD 4 he was tolerating a liquid diet and his pain was controlled.  He was discharged with his JP drain, which was draining ~40 ml/day.    Consults: None  Significant Diagnostic Studies: labs: cbc  Treatments: IV hydration, analgesia: Vicodin and surgery: see above  Disposition: Home

## 2017-01-27 NOTE — Patient Outreach (Signed)
First outreach to complete transition of care phone call/needs assessment. Patient was hospitalized on 01/22/2017 for repair of recurrent hiatal hernia with worsening heartburn/reflux and dysphagia via XI robotic assisted laparoscopic Nissen fundoplication, esophagogastroduodenoscopy and insertion of mesh. He was discharged on 01/26/17 to his home. Left message on his mobile number requesting return call. If no return call, a [facility] RNCM will attempt 2nd outreach on 01/28/2017.  Barrington Ellison RN,CCM,CDE [place] [facility] Coordinator [facility] [employer] [phone] Office Fax [phone]

## 2017-01-28 ENCOUNTER — Other Ambulatory Visit: Payer: Self-pay | Admitting: *Deleted

## 2017-01-28 NOTE — Patient Outreach (Signed)
[facility]) Care Management  01/28/2017  [name] 08/14/1970 [id number]      Subjective:Telephone call to patient's home  / mobile number, no answer, left HIPAA compliant voicemail message, and requested call back.    Objective: Per KPN (Knowledge Performance Now, point of care tool) and chart review,patient hospitalized 01/22/17 -01/26/17 for Recurrent Hiatal hernia with worsening heartburn/reflux and dysphagia.   Status post Laparoscopic/Robotic lysis of adhesions x 2.5 hours, Robotic reduction of paraesophageal hiatal hernia, Primary repair of hiatal hernia over pledgets, Absorbable Mesh reinforcement,  Anterior and posterior gastropexy,  Nissen fundoplication 2 cm over a 56-French bougie, EGD on 01/22/17.      Assessment: Received [facility] Transition of care referral on 01/25/17.   Transition of care follow up pending patient contact.       Plan: RNCM will call patient for 3rd telephone outreach attempt, transition of care follow up, within 10 business days if no return call.     [name] H. Annia Friendly, BSN, [place] [facility] Telephonic CM Phone: [phone] Fax: [phone]

## 2017-01-31 DIAGNOSIS — Z760 Encounter for issue of repeat prescription: Secondary | ICD-10-CM | POA: Diagnosis not present

## 2017-02-01 ENCOUNTER — Ambulatory Visit: Payer: Self-pay | Admitting: *Deleted

## 2017-02-01 ENCOUNTER — Other Ambulatory Visit: Payer: Self-pay | Admitting: *Deleted

## 2017-02-01 ENCOUNTER — Encounter: Payer: Self-pay | Admitting: *Deleted

## 2017-02-01 NOTE — Patient Outreach (Signed)
[facility]) Care Management  02/01/2017  [name] 03/29/1970 [id number]   Subjective:Telephone call to patient's home  / mobile number, no answer, left HIPAA compliant voicemail message, and requested call back.    Objective: Per KPN (Knowledge Performance Now, point of care tool) and chart review,patient hospitalized 01/22/17 -01/26/17 for Recurrent Hiatal hernia with worsening heartburn/reflux and dysphagia.   Status post Laparoscopic/Robotic lysis of adhesions x 2.5 hours, Robotic reduction of paraesophageal hiatal hernia, Primary repair of hiatal hernia over pledgets, Absorbable Mesh reinforcement,  Anterior and posterior gastropexy,  Nissen fundoplication 2 cm over a 56-French bougie, EGD on 01/22/17.      Assessment: Received [facility]MR Transition of care referral on 01/25/17.   Transition of care follow up pending patient contact.       Plan: RNCM will send unsuccessful outreach  letter, [facility] pamphlet, and proceed with case closure, within 10 business days if no return call.    [name] H. Annia Friendly, BSN, [place] Management [facility] Telephonic CM Phone: [phone] Fax: [phone]

## 2017-02-08 ENCOUNTER — Encounter: Payer: 59 | Admitting: Internal Medicine

## 2017-02-15 ENCOUNTER — Encounter: Payer: Self-pay | Admitting: Family Medicine

## 2017-02-15 ENCOUNTER — Other Ambulatory Visit: Payer: Self-pay

## 2017-02-15 MED ORDER — ZOLPIDEM TARTRATE ER 12.5 MG PO TBCR: 12.5000 mg | EXTENDED_RELEASE_TABLET | Freq: Every evening | ORAL | 2 refills | Status: DC | PRN

## 2017-02-15 MED ORDER — CLONAZEPAM 0.5 MG PO TBDP: ORAL_TABLET | ORAL | 2 refills | Status: DC

## 2017-02-15 MED FILL — ZOLPIDEM TART ER 12.5 MG TA: 12.5 | 30 days supply | Qty: 30 | Fill #0

## 2017-02-15 MED FILL — ONDANSETRON HCL 4 MG TABLET: 4 | 3 days supply | Qty: 8 | Fill #1

## 2017-02-15 MED FILL — clonazePAM 0.5 MG TBDP: 0.5 | 30 days supply | Qty: 60 | Fill #0

## 2017-02-15 NOTE — Progress Notes (Signed)
Printed Clonazepam and Ambien for TA to sign with note written asking pharm to advise pt to sched appt for April/will fax when approved and signed/thx dmf

## 2017-02-16 ENCOUNTER — Other Ambulatory Visit: Payer: Self-pay | Admitting: *Deleted

## 2017-02-16 MED FILL — HYDROCODON-APAP 5-325: 5-325 | 4 days supply | Qty: 30 | Fill #0

## 2017-02-16 NOTE — Patient Outreach (Signed)
[name]) [name]  02/16/2017  [name] [birth year] [id number]   No response from patient outreach attempts will proceed with case closure.     Objective:Per KPN (Knowledge Performance Now, point of [name] tool) and chart review,patient hospitalized 01/22/17 -01/26/17 forRecurrent Hiatal hernia with worsening heartburn/reflux and dysphagia. Status post Laparoscopic/Robotic lysis of adhesions x 2.5 hours,Robotic reduction of paraesophageal hiatal hernia,Primary repair of hiatal hernia over pledgets, Absorbable Mesh reinforcement,Anteriorandposterior gastropexy,Nissen fundoplication 2 cm over a 56-French bougie,EGDon 01/22/17.     Assessment: Received [facility]MR Transition of [name] referral on 01/25/17.Transition of [name] follow up not completed due to unable to contact patient and will proceed with case closure.       Plan:RNCM will send case closure due to unable to reach request to [name] at [facility].    [name] H. Annia Friendly, BSN, [place] Management [facility] Telephonic CM Phone: [phone] Fax: [phone]

## 2017-03-02 ENCOUNTER — Encounter: Payer: 59 | Admitting: Internal Medicine

## 2017-03-15 MED FILL — clonazePAM 0.5 MG TBDP: 0.5 | 30 days supply | Qty: 60 | Fill #1

## 2017-03-15 MED FILL — ONDANSETRON HCL 4 MG TABLET: 4 | 3 days supply | Qty: 8 | Fill #2

## 2017-03-15 MED FILL — ZOLPIDEM TART ER 12.5 MG TA: 12.5 | 30 days supply | Qty: 30 | Fill #1

## 2017-04-05 ENCOUNTER — Other Ambulatory Visit: Payer: Self-pay | Admitting: Family Medicine

## 2017-04-05 MED FILL — ONDANSETRON HCL 4 MG TABLET: 4 | 3 days supply | Qty: 8 | Fill #3

## 2017-04-08 ENCOUNTER — Other Ambulatory Visit: Payer: Self-pay | Admitting: Family Medicine

## 2017-04-09 MED FILL — VIT D2 1.25 MG (50,000 UNIT: 1.25 MG | 42 days supply | Qty: 6 | Fill #0

## 2017-04-12 MED FILL — clonazePAM 0.5 MG TBDP: 0.5 | 30 days supply | Qty: 60 | Fill #2

## 2017-04-12 MED FILL — ZOLPIDEM TART ER 12.5 MG TA: 12.5 | 30 days supply | Qty: 30 | Fill #2

## 2017-04-14 ENCOUNTER — Encounter: Payer: Self-pay | Admitting: Family Medicine

## 2017-04-14 ENCOUNTER — Ambulatory Visit (INDEPENDENT_AMBULATORY_CARE_PROVIDER_SITE_OTHER): Payer: No Typology Code available for payment source | Admitting: Family Medicine

## 2017-04-14 VITALS — BP 132/92 | HR 81 | Temp 97.9°F | Ht 68.75 in | Wt 214.4 lb

## 2017-04-14 DIAGNOSIS — R7309 Other abnormal glucose: Secondary | ICD-10-CM | POA: Diagnosis not present

## 2017-04-14 DIAGNOSIS — Z0000 Encounter for general adult medical examination without abnormal findings: Secondary | ICD-10-CM | POA: Insufficient documentation

## 2017-04-14 DIAGNOSIS — K219 Gastro-esophageal reflux disease without esophagitis: Secondary | ICD-10-CM | POA: Diagnosis not present

## 2017-04-14 DIAGNOSIS — F5101 Primary insomnia: Secondary | ICD-10-CM | POA: Diagnosis not present

## 2017-04-14 DIAGNOSIS — Z8719 Personal history of other diseases of the digestive system: Secondary | ICD-10-CM | POA: Diagnosis not present

## 2017-04-14 LAB — COMPREHENSIVE METABOLIC PANEL
ALT: 59 U/L — ABNORMAL HIGH (ref 0–53)
AST: 35 U/L (ref 0–37)
Albumin: 4.1 g/dL (ref 3.5–5.2)
Alkaline Phosphatase: 51 U/L (ref 39–117)
BUN: 12 mg/dL (ref 6–23)
CHLORIDE: 106 meq/L (ref 96–112)
CO2: 34 mEq/L — ABNORMAL HIGH (ref 19–32)
Calcium: 9.8 mg/dL (ref 8.4–10.5)
Creatinine, Ser: 0.8 mg/dL (ref 0.40–1.50)
GFR: 110.18 mL/min (ref >60.00)
Glucose, Bld: 111 mg/dL — ABNORMAL HIGH (ref 70–99)
POTASSIUM: 4.7 meq/L (ref 3.5–5.1)
SODIUM: 148 meq/L — AB (ref 135–145)
TOTAL PROTEIN: 6.5 g/dL (ref 6.0–8.3)
Total Bilirubin: 0.3 mg/dL (ref 0.2–1.2)

## 2017-04-14 LAB — CBC WITH DIFFERENTIAL/PLATELET
BASOS PCT: 0.6 % (ref 0.0–3.0)
Basophils Absolute: 0 10*3/uL (ref 0.0–0.1)
EOS PCT: 8.1 % — AB (ref 0.0–5.0)
Eosinophils Absolute: 0.7 10*3/uL (ref 0.0–0.7)
HCT: 40.1 % (ref 39.0–52.0)
Hemoglobin: 13.3 g/dL (ref 13.0–17.0)
LYMPHS ABS: 1.8 10*3/uL (ref 0.7–4.0)
Lymphocytes Relative: 20.8 % (ref 12.0–46.0)
MCHC: 33.1 g/dL (ref 30.0–36.0)
MCV: 91.9 fl (ref 78.0–100.0)
MONOS PCT: 7.7 % (ref 3.0–12.0)
Monocytes Absolute: 0.7 10*3/uL (ref 0.1–1.0)
NEUTROS PCT: 62.8 % (ref 43.0–77.0)
Neutro Abs: 5.6 10*3/uL (ref 1.4–7.7)
Platelets: 364 10*3/uL (ref 150.0–400.0)
RBC: 4.37 Mil/uL (ref 4.22–5.81)
RDW: 14.4 % (ref 11.5–15.5)
WBC: 8.9 10*3/uL (ref 4.0–10.5)

## 2017-04-14 LAB — LIPID PANEL
Cholesterol: 124 mg/dL (ref 0–200)
HDL: 55.6 mg/dL (ref >39.00)
LDL CALC: 58 mg/dL (ref 0–99)
NonHDL: 68.86
Total CHOL/HDL Ratio: 2
Triglycerides: 52 mg/dL (ref 0.0–149.0)
VLDL: 10.4 mg/dL (ref 0.0–40.0)

## 2017-04-14 LAB — PSA: PSA: 1.87 ng/mL (ref 0.10–4.00)

## 2017-04-14 MED ORDER — ONDANSETRON HCL 8 MG PO TABS: ORAL_TABLET | ORAL | 1 refills | Status: DC

## 2017-04-14 MED FILL — ONDANSETRON HCL 8 MG TABS: 8 | 10 days supply | Qty: 30 | Fill #0

## 2017-04-14 NOTE — Assessment & Plan Note (Signed)
Continue PPI, zofran eRx snet. Call or return to clinic prn if these symptoms worsen or fail to improve as anticipated. The patient indicates understanding of these issues and agrees with the plan.

## 2017-04-14 NOTE — Patient Instructions (Signed)
Great to see you. I will call you with your lab results from today and you can view them online.   

## 2017-04-14 NOTE — Progress Notes (Signed)
Subjective:   Patient ID: [name], male    DOB: [birth year], 47 y.o.   MRN: [id number]  [name] is a pleasant 46 y.o. year old male who presents to clinic today with Annual Exam (here for CPE, fasting.) and Stomach bloating (bloating since surgery Dec 21. No pain , but nausea. Wanted to discuss his nausea mediations)  on 04/14/2017  HPI:   Has been doing well since his extensive surgery in 01/2017.  Dr. Johney Maine performed the following:  XI ROBOTIC RECURRENT INCARCERATED PARAESOPHAGEAL HIATAL HERNIA  WITH  NISSEN FUNDOPLICATION WITH 2.5 HOURS OF LYSIS OF ADHESIONS. ESOPHAGOGASTRODUODENOSCOPY (EGD) INSERTION OF MESH  He is still having more nausea- asking for a refill of zofran.    Current Outpatient Medications on File Prior to Visit  Medication Sig Dispense Refill  . acetaminophen (TYLENOL) 325 MG tablet Take 650 mg by mouth every 6 (six) hours as needed for moderate pain or headache.    . clonazePAM (KLONOPIN) 0.5 MG disintegrating tablet TAKE 1 TABLET BY MOUTH TWICE A DAY AS NEEDED FOR ANXIETY 60 tablet 2  . HYDROcodone-acetaminophen (NORCO/VICODIN) 5-325 MG tablet Take 1-2 tablets by mouth every 6 (six) hours as needed for moderate pain or severe pain. 30 tablet 0  . ondansetron (ZOFRAN) 4 MG tablet Take 1 tablet (4 mg total) by mouth every 8 (eight) hours as needed for nausea. 8 tablet 5  . pantoprazole (PROTONIX) 40 MG tablet TAKE 1 TABLET BY MOUTH TWICE A DAY (Patient taking differently: TAKE 40 MG BY MOUTH TWICE A DAY) 180 tablet 2  . Vitamin D, Ergocalciferol, (DRISDOL) 50000 units CAPS capsule TAKE 1 CAPSULE BY MOUTH EVERY 7 DAYS. TAKE FOR 6 WEEKS THEN RECHECK LABS 6 capsule 0  . zolpidem (AMBIEN CR) 12.5 MG CR tablet Take 1 tablet (12.5 mg total) by mouth at bedtime as needed. for sleep 30 tablet 2  . promethazine (PHENERGAN) 25 MG suppository Place 1 suppository (25 mg total) rectally every 6 (six) hours as needed for nausea. (Patient not taking: Reported on  04/14/2017) 5 suppository 5   No current facility-administered medications on file prior to visit.     Allergies  Allergen Reactions  . Dilaudid [Hydromorphone Hcl] Rash and Other (See Comments)    Shakey  . Metoclopramide Hcl Hives, Swelling, Rash and Other (See Comments)    Swelling in lips  . Nsaids     H/o bleeding gastric ulcers    Past Medical History:  Diagnosis Date  . Anxiety    takes Xanax daily prn  . Chronic gastric ulcer with bleeding s/p suture repair 2012 09/29/2010   Head 3 EGD which failed to stop the bleeding. Had exploratory laparotomy on 10/04/2010. Possible cause of the gastric ulcer was NSAID use.   . Clotting disorder ([place])   . Constipation    related to pain meds  . Dizziness    occasionally  . Erosive esophagitis   . External hemorrhoid, bleeding   . Gastric ulcer    history of  . GERD (gastroesophageal reflux disease)    takes Protonix bid  . Hiatal hernia   . History of blood clots 2012   in abdomen  . History of blood transfusion 2012  . Incisional hernia s/p open repair w mesh Aug 2013 05/26/2011  . Joint pain    knees  . Nocturia    depends on amount of fluid he drinks  . Pneumonia   . Pre-diabetes   . Primary localized osteoarthritis of  left hip 04/24/2014  . Serrated polyp of colon   . Sleep apnea    no cpap  mild no cpap needed  . Upper GI bleed   . Ventral hernia     Past Surgical History:  Procedure Laterality Date  . COLONOSCOPY    . ESOPHAGEAL MANOMETRY N/A 10/19/2016   Procedure: ESOPHAGEAL MANOMETRY (EM);  Surgeon: Mauri Pole, MD;  Location: [facility];  Service: Endoscopy;  Laterality: N/A;  . ESOPHAGOGASTRODUODENOSCOPY    . ESOPHAGOGASTRODUODENOSCOPY N/A 01/22/2017   Procedure: ESOPHAGOGASTRODUODENOSCOPY (EGD);  Surgeon: [name], MD;  Location: [facility];  Service: General;  Laterality: N/A;  . INCISIONAL HERNIA REPAIR  09/04/2011   Procedure: HERNIA REPAIR INCISIONAL;  Surgeon: Madilyn Hook, DO;  Location: [facility];  Service: General;  Laterality: N/A;  debridment calcified mass  . INSERTION OF MESH  09/04/2011   retrorectus ultrapro "30x30cm"  . INSERTION OF MESH N/A 01/22/2017   Procedure: INSERTION OF MESH;  Surgeon: [name], MD;  Location: [facility];  Service: General;  Laterality: N/A;  . NISSEN FUNDOPLICATION     Open 0981X  . POLYPECTOMY    . STOMACH SURGERY  10/05/2010   Oversewing of gastric ulcer.  Dr Brantley Stage  . TOTAL HIP ARTHROPLASTY Left 04/24/2014   Procedure: LEFT TOTAL HIP ARTHROPLASTY;  Surgeon: Marchia Bond, MD;  Location: [facility];  Service: Orthopedics;  Laterality: Left;  . UPPER GASTROINTESTINAL ENDOSCOPY      Family History  Problem Relation Age of Onset  . Diabetes Mother   . Liver disease Mother   . Diabetes Father   . Heart disease Father   . Colon cancer Neg Hx   . Stomach cancer Neg Hx   . Rectal cancer Neg Hx   . Esophageal cancer Neg Hx   . Colon polyps Neg Hx     Social History   Socioeconomic History  . Marital status: Single    Spouse name: Not on file  . Number of children: 1  . Years of education: Not on file  . Highest education level: Not on file  Social Needs  . Financial resource strain: Not on file  . Food insecurity - worry: Not on file  . Food insecurity - inability: Not on file  . Transportation needs - medical: Not on file  . Transportation needs - non-medical: Not on file  Occupational History  . Occupation: Maintenance - [employer]  Tobacco Use  . Smoking status: Never Smoker  . Smokeless tobacco: Current User    Types: Chew  . Tobacco comment: occ-Chew  Substance and Sexual Activity  . Alcohol use: Yes    Alcohol/week: 0.0 oz    Comment: 09/04/11 "very seldom"  . Drug use: No  . Sexual activity: Yes  Other Topics Concern  . Not on file  Social History Narrative  . Not on file   The PMH, PSH, Social History, Family History, Medications, and allergies have been reviewed in [facility], and have been updated if relevant.   Review  of Systems  Constitutional: Negative.   HENT: Negative.   Eyes: Negative.   Respiratory: Negative.   Cardiovascular: Negative.   Gastrointestinal: Positive for nausea. Negative for abdominal pain, anal bleeding, blood in stool, constipation, diarrhea, rectal pain and vomiting.  Genitourinary: Negative.   Musculoskeletal: Negative.   Skin: Negative.   Allergic/Immunologic: Negative.   Neurological: Negative.   Hematological: Negative.   Psychiatric/Behavioral: Negative.   All other systems reviewed and are negative.  Objective:    BP (!) 132/92 (BP Location: Left Arm, Patient Position: Sitting, Cuff Size: Normal)   Pulse 81   Temp 97.9 F (36.6 C) (Oral)   Ht 5' 8.75" (1.746 m)   Wt 214 lb 6.4 oz (97.3 kg)   SpO2 96%   BMI 31.89 kg/m   Wt Readings from Last 3 Encounters:  04/14/17 214 lb 6.4 oz (97.3 kg)  01/22/17 216 lb (98 kg)  01/19/17 216 lb (98 kg)    Physical Exam    General:  Well-developed,well-nourished,in no acute distress; alert,appropriate and cooperative throughout examination Head:  normocephalic and atraumatic.   Eyes:  vision grossly intact, PERRL Ears:  R ear normal and L ear normal externally, TMs clear bilaterally Nose:  no external deformity.   Mouth:  good dentition.   Neck:  No deformities, masses, or tenderness noted Lungs:  Normal respiratory effort, chest expands symmetrically. Lungs are clear to auscultation, no crackles or wheezes. Heart:  Normal rate and regular rhythm. S1 and S2 normal without gallop, murmur, click, rub or other extra sounds. Msk:  No deformity or scoliosis noted of thoracic or lumbar spine.   Extremities:  No clubbing, cyanosis, edema, or deformity noted with normal full range of motion of all joints.   Neurologic:  alert & oriented X3 and gait normal.   Skin:  Intact without suspicious lesions or rashes Cervical Nodes:  No lymphadenopathy noted Axillary Nodes:  No palpable lymphadenopathy Psych:  Cognition and  judgment appear intact. Alert and cooperative with normal attention span and concentration. No apparent delusions, illusions, hallucinations      Assessment & Plan:   No diagnosis found. No Follow-up on file.

## 2017-04-14 NOTE — Assessment & Plan Note (Signed)
Reviewed preventive care protocols, scheduled due services, and updated immunizations Discussed nutrition, exercise, diet, and healthy lifestyle.  Orders Placed This Encounter  Procedures  . CBC with Differential/Platelet  . Comprehensive metabolic panel  . Lipid panel  . PSA    

## 2017-04-14 NOTE — Assessment & Plan Note (Signed)
Has still been needing ambien despite other sleep aid attempts.

## 2017-04-23 ENCOUNTER — Ambulatory Visit: Payer: Self-pay | Admitting: Internal Medicine

## 2017-04-23 ENCOUNTER — Encounter: Payer: Self-pay | Admitting: Internal Medicine

## 2017-04-23 VITALS — BP 110/70 | HR 86 | Ht 69.0 in | Wt 211.0 lb

## 2017-04-23 DIAGNOSIS — K648 Other hemorrhoids: Secondary | ICD-10-CM

## 2017-04-23 DIAGNOSIS — K2100 Gastro-esophageal reflux disease with esophagitis, without bleeding: Secondary | ICD-10-CM

## 2017-04-23 DIAGNOSIS — Z8719 Personal history of other diseases of the digestive system: Secondary | ICD-10-CM

## 2017-04-23 DIAGNOSIS — K5900 Constipation, unspecified: Secondary | ICD-10-CM

## 2017-04-23 DIAGNOSIS — Z9889 Other specified postprocedural states: Secondary | ICD-10-CM

## 2017-04-23 NOTE — Patient Instructions (Signed)
Please follow up with Dr Hilarie Fredrickson on Tuesday, 07/06/17 @ 345 pm.  Please purchase the following medications over the counter and take as directed: Miralax 17 grams (1 capful) daily  HEMORRHOID BANDING PROCEDURE    FOLLOW-UP CARE   1. The procedure you have had should have been relatively painless since the banding of the area involved does not have nerve endings and there is no pain sensation.  The rubber band cuts off the blood supply to the hemorrhoid and the band may fall off as soon as 48 hours after the banding (the band may occasionally be seen in the toilet bowl following a bowel movement). You may notice a temporary feeling of fullness in the rectum which should respond adequately to plain Tylenol or Motrin.  2. Following the banding, avoid strenuous exercise that evening and resume full activity the next day.  A sitz bath (soaking in a warm tub) or bidet is soothing, and can be useful for cleansing the area after bowel movements.     3. To avoid constipation, take two tablespoons of natural wheat bran, natural oat bran, flax, Benefiber or any over the counter fiber supplement and increase your water intake to 7-8 glasses daily.    4. Unless you have been prescribed anorectal medication, do not put anything inside your rectum for two weeks: No suppositories, enemas, fingers, etc.  5. Occasionally, you may have more bleeding than usual after the banding procedure.  This is often from the untreated hemorrhoids rather than the treated one.  Don't be concerned if there is a tablespoon or so of blood.  If there is more blood than this, lie flat with your bottom higher than your head and apply an ice pack to the area. If the bleeding does not stop within a half an hour or if you feel faint, call our office at [phone] or go to the emergency room.  6. Problems are not common; however, if there is a substantial amount of bleeding, severe pain, chills, fever or difficulty passing urine  (very rare) or other problems, you should call us at [phone] or report to the nearest emergency room.  7. Do not stay seated continuously for more than 2-3 hours for a day or two after the procedure.  Tighten your buttock muscles 10-15 times every two hours and take 10-15 deep breaths every 1-2 hours.  Do not spend more than a few minutes on the toilet if you cannot empty your bowel; instead re-visit the toilet at a later time.

## 2017-04-23 NOTE — Progress Notes (Signed)
[name] is a 47 year old male who returns for additional hemorrhoidal banding Initial hemorrhoidal banding on 01/08/2017 for symptomatic internal hemorrhoids.  Symptoms were anal itching, occasional bleeding with bowel movement Since his last visit he underwent repair of large hiatal hernia which was performed robotically by Dr. Johney Maine.  He has been recovering well though continues to have some postoperative nausea.  Constipation is been a big issue for him.  Some upper abdominal discomfort but no pain.  Complete resolution of heartburn and has had no reflux symptoms whatsoever.  He has had some mild dysphagia which seems to be improving.  After first banding treatment he has not noticed much difference though he reports his constipation has become more significant for him and he blames this for some of his perianal anal discomfort and bleeding.  Current Medications, Allergies, Past Medical History, Past Surgical History, Family History and Social History were reviewed in [employer] record.  BP 110/70   Pulse 86   Ht 5\' 9"  (1.753 m)   Wt 211 lb (95.7 kg)   BMI 31.16 kg/m  Gen: awake, alert, NAD HEENT: anicteric, op clear CV: RRR, no mrg Pulm: CTA b/l Abd: soft, NT/ND, +BS throughout Ext: no c/c/e Neuro: nonfocal   PROCEDURE NOTE:  The patient presents with symptomatic grade 2 internal hemorrhoids, requesting rubber band ligation of his hemorrhoidal disease.  All risks, benefits and alternative forms of therapy were described and informed consent was obtained.   The anorectum was pre-medicated with 0.125% nitroglycerin ointment The decision was made to band the LL (RA in Dec 2018) internal hemorrhoid, and the [place] was used to perform band ligation without complication.   Digital anorectal examination was then performed to assure proper positioning of the band, and to adjust the banded tissue as required.  The patient was discharged home without  pain or other issues.  Dietary and behavioral recommendations were given and along with follow-up instructions.     The patient will return as scheduled for  follow-up and possible additional banding as required. No complications were encountered and the patient tolerated the procedure well.   2.  Constipation --a daily issue and exacerbating his hemorrhoidal disease.  Begin MiraLAX 17 g daily  3.  History of esophagitis/GERD --symptoms are improving after robotic repair of recurrent hiatal hernia.  I will see him back in June to discuss the symptoms and also consider repeat upper endoscopy to document healing of his previously severe esophagitis.  Continue soft diet and once daily PPI

## 2017-04-27 ENCOUNTER — Telehealth: Payer: No Typology Code available for payment source | Admitting: Family

## 2017-04-27 DIAGNOSIS — R112 Nausea with vomiting, unspecified: Secondary | ICD-10-CM

## 2017-04-27 NOTE — Progress Notes (Signed)
Based on what you shared with me it looks like you have a condition that should be evaluated in a face to face office visit.  NOTE: If you entered your credit card information for this eVisit, you will not be charged. You may see a "hold" on your card for the $30 but that hold will drop off and you will not have a charge processed.  If you are having a true medical emergency please call 911.  If you need an urgent face to face visit, [facility] has four urgent care centers for your convenience.  If you need care fast and have a high deductible or no insurance consider:   [email or web address] to reserve your spot online an avoid wait times  [facility] [address], [address] [place], [place] [address] 8 am to 8 pm Monday-Friday 10 am to 4 pm Saturday-Sunday *Across the street from [employer]  [facility]  [address] [place], [address] 8 am to 5 pm Monday-Friday * In the [facility]   The following sites will take your  insurance:  . [facility]  [phone] Get Driving Directions Find a Provider at this Location  [address] [place], [place] [address] . 10 am to 8 pm Monday-Friday . 12 pm to 8 pm Saturday-Sunday   . [facility]  [phone] Get Driving Directions Find a Provider at this Location  [address], [address] [place], [place] [address] . 8 am to 8 pm Monday-Friday . 9 am to 6 pm Saturday . 11 am to 6 pm Sunday   . [facility]  [phone] Get Driving Directions  [address] [place], [place] [address] . 8 am to 8 pm Monday-Friday . 8 am to 4 pm Saturday-Sunday   Your e-visit answers were reviewed by a board certified advanced clinical practitioner to complete your personal care plan.  Thank you for using e-Visits. 

## 2017-05-10 ENCOUNTER — Telehealth: Payer: No Typology Code available for payment source | Admitting: Family

## 2017-05-10 DIAGNOSIS — J069 Acute upper respiratory infection, unspecified: Secondary | ICD-10-CM

## 2017-05-10 MED ORDER — FLUTICASONE PROPIONATE 50 MCG/ACT NA SUSP: 2.0000 | Freq: Every day | NASAL | 6 refills | Status: DC

## 2017-05-10 MED FILL — FLUTICASONE PROP 50 MCG SPR: 50 | 30 days supply | Qty: 16 | Fill #0

## 2017-05-10 MED FILL — ONDANSETRON HCL 8 MG TABS: 8 | 10 days supply | Qty: 30 | Fill #1

## 2017-05-10 NOTE — Progress Notes (Signed)
We are sorry you are not feeling well.  Here is how we plan to help!  Based on what you have shared with me, it looks like you may have a viral upper respiratory infection or a "common cold".  Colds are caused by a large number of viruses; however, rhinovirus is the most common cause.   Symptoms of the common cold vary from person to person, with common symptoms including sore throat, cough, and malaise.  A low-grade fever of 100.4 may present, but is often uncommon.  Symptoms vary however, and are closely related to a person's age or underlying illnesses.  The most common symptoms associated with the common cold are nasal discharge or congestion, cough, sneezing, headache and pressure in the ears and face.  Cold symptoms usually persist for about 3 to 10 days, but can last up to 2 weeks.  It is important to know that colds do not cause serious illness or complications in most cases.    The common cold is transmitted from person to person, with the most common method of transmission being a person's hands.  The virus is able to live on the skin and can infect other persons for up to 2 hours after direct contact.  Also, colds are transmitted when someone coughs or sneezes; thus, it is important to cover the mouth to reduce this risk.  To keep the spread of the common cold at bay, good hand hygiene is very important.  This is an infection that is most likely caused by a virus. There are no specific treatments for the common cold other than to help you with the symptoms until the infection runs its course.    For nasal congestion, you may use an oral decongestants such as Mucinex D or if you have glaucoma or high blood pressure use plain Mucinex.  Saline nasal spray or nasal drops can help and can safely be used as often as needed for congestion.  For your congestion, I have prescribed Fluticasone nasal spray one spray in each nostril twice a day  If you do not have a history of heart disease, hypertension,  diabetes or thyroid disease, prostate/bladder issues or glaucoma, you may also use Sudafed to treat nasal congestion.  It is highly recommended that you consult with a pharmacist or your primary care physician to ensure this medication is safe for you to take.     If you have a cough, you may use cough suppressants such as Delsym and Robitussin.  If you have glaucoma or high blood pressure, you can also use Coricidin HBP.     If you have a sore or scratchy throat, use a saltwater gargle-  to  teaspoon of salt dissolved in a 4-ounce to 8-ounce glass of warm water.  Gargle the solution for approximately 15-30 seconds and then spit.  It is important not to swallow the solution.  You can also use throat lozenges/cough drops and Chloraseptic spray to help with throat pain or discomfort.  Warm or cold liquids can also be helpful in relieving throat pain.  For headache, pain or general discomfort, you can use Ibuprofen or Tylenol as directed.   Some authorities believe that zinc sprays or the use of Echinacea may shorten the course of your symptoms.   HOME CARE . Only take medications as instructed by your medical team. . Be sure to drink plenty of fluids. Water is fine as well as fruit juices, sodas and electrolyte beverages. You may want to stay   away from caffeine or alcohol. If you are nauseated, try taking small sips of liquids. How do you know if you are getting enough fluid? Your urine should be a pale yellow or almost colorless. . Get rest. . Taking a steamy shower or using a humidifier may help nasal congestion and ease sore throat pain. You can place a towel over your head and breathe in the steam from hot water coming from a faucet. . Using a saline nasal spray works much the same way. . Cough drops, hard candies and sore throat lozenges may ease your cough. . Avoid close contacts especially the very young and the elderly . Cover your mouth if you cough or sneeze . Always remember to wash  your hands.   GET HELP RIGHT AWAY IF: . You develop worsening fever. . If your symptoms do not improve within 10 days . You become short of breath. . You develop yellow or green discharge from your nose over 3 days. . You have coughing fits . You develop a severe head ache or visual changes. . You develop shortness of breath or difficulty breathing. . Your symptoms persist after you have completed your treatment plan  MAKE SURE YOU   Understand these instructions.  Will watch your condition.  Will get help right away if you are not doing well or get worse.  Your e-visit answers were reviewed by a board certified advanced clinical practitioner to complete your personal care plan. Depending upon the condition, your plan could have included both over the counter or prescription medications. Please review your pharmacy choice. If there is a problem, you may call our nursing hot line at and have the prescription routed to another pharmacy. Your safety is important to us. If you have drug allergies check your prescription carefully.   You can use MyChart to ask questions about today's visit, request a non-urgent call back, or ask for a work or school excuse for 24 hours related to this e-Visit. If it has been greater than 24 hours you will need to follow up with your provider, or enter a new e-Visit to address those concerns. You will get an e-mail in the next two days asking about your experience.  I hope that your e-visit has been valuable and will speed your recovery. Thank you for using e-visits.      

## 2017-05-11 ENCOUNTER — Other Ambulatory Visit: Payer: Self-pay

## 2017-05-11 ENCOUNTER — Encounter: Payer: Self-pay | Admitting: Family Medicine

## 2017-05-11 DIAGNOSIS — E559 Vitamin D deficiency, unspecified: Secondary | ICD-10-CM

## 2017-05-11 MED ORDER — VITAMIN D (ERGOCALCIFEROL) 1.25 MG (50000 UNIT) PO CAPS: ORAL_CAPSULE | ORAL | 0 refills | Status: DC

## 2017-05-11 MED ORDER — CLONAZEPAM 0.5 MG PO TBDP: ORAL_TABLET | ORAL | 2 refills | Status: DC

## 2017-05-11 MED ORDER — ZOLPIDEM TARTRATE ER 12.5 MG PO TBCR: 12.5000 mg | EXTENDED_RELEASE_TABLET | Freq: Every evening | ORAL | 2 refills | Status: DC | PRN

## 2017-05-11 MED FILL — clonazePAM 0.5 MG TBDP: 0.5 | 30 days supply | Qty: 60 | Fill #0

## 2017-05-11 MED FILL — ZOLPIDEM TART ER 12.5 MG TA: 12.5 | 30 days supply | Qty: 30 | Fill #0

## 2017-05-13 ENCOUNTER — Other Ambulatory Visit (INDEPENDENT_AMBULATORY_CARE_PROVIDER_SITE_OTHER): Payer: No Typology Code available for payment source

## 2017-05-13 DIAGNOSIS — R5383 Other fatigue: Secondary | ICD-10-CM | POA: Diagnosis not present

## 2017-05-13 DIAGNOSIS — E559 Vitamin D deficiency, unspecified: Secondary | ICD-10-CM | POA: Diagnosis not present

## 2017-05-13 LAB — COMPREHENSIVE METABOLIC PANEL
ALK PHOS: 57 U/L (ref 39–117)
ALT: 23 U/L (ref 0–53)
AST: 19 U/L (ref 0–37)
Albumin: 4.5 g/dL (ref 3.5–5.2)
BILIRUBIN TOTAL: 0.4 mg/dL (ref 0.2–1.2)
BUN: 15 mg/dL (ref 6–23)
CO2: 31 meq/L (ref 19–32)
Calcium: 10.1 mg/dL (ref 8.4–10.5)
Chloride: 105 mEq/L (ref 96–112)
Creatinine, Ser: 1.22 mg/dL (ref 0.40–1.50)
GFR: 67.68 mL/min (ref >60.00)
GLUCOSE: 104 mg/dL — AB (ref 70–99)
POTASSIUM: 5.5 meq/L — AB (ref 3.5–5.1)
SODIUM: 145 meq/L (ref 135–145)
TOTAL PROTEIN: 7 g/dL (ref 6.0–8.3)

## 2017-05-13 LAB — VITAMIN D 25 HYDROXY (VIT D DEFICIENCY, FRACTURES): VITD: 24.93 ng/mL — AB (ref 30.00–100.00)

## 2017-05-19 ENCOUNTER — Other Ambulatory Visit: Payer: Self-pay

## 2017-05-19 ENCOUNTER — Encounter: Payer: Self-pay | Admitting: Family Medicine

## 2017-05-19 MED ORDER — VITAMIN D (ERGOCALCIFEROL) 1.25 MG (50000 UNIT) PO CAPS: ORAL_CAPSULE | ORAL | 0 refills | Status: DC

## 2017-05-19 MED FILL — VIT D2 1.25 MG (50,000 UNIT: 1.25 MG | 42 days supply | Qty: 6 | Fill #0

## 2017-06-08 MED FILL — clonazePAM 0.5 MG TBDP: 0.5 | 30 days supply | Qty: 60 | Fill #1

## 2017-06-08 MED FILL — ZOLPIDEM TART ER 12.5 MG TA: 12.5 | 30 days supply | Qty: 30 | Fill #1

## 2017-06-15 ENCOUNTER — Other Ambulatory Visit: Payer: Self-pay

## 2017-06-15 ENCOUNTER — Encounter: Payer: Self-pay | Admitting: Family Medicine

## 2017-06-15 DIAGNOSIS — R112 Nausea with vomiting, unspecified: Secondary | ICD-10-CM

## 2017-06-15 DIAGNOSIS — K219 Gastro-esophageal reflux disease without esophagitis: Secondary | ICD-10-CM

## 2017-06-15 MED ORDER — PANTOPRAZOLE SODIUM 40 MG PO TBEC: 40.0000 mg | DELAYED_RELEASE_TABLET | Freq: Two times a day (BID) | ORAL | 2 refills | Status: DC

## 2017-06-15 MED ORDER — ONDANSETRON HCL 8 MG PO TABS: ORAL_TABLET | ORAL | 2 refills | Status: DC

## 2017-06-15 MED FILL — PANTOPRAZOLE SOD DR 40 MG T: 40 | 90 days supply | Qty: 180 | Fill #0

## 2017-06-15 MED FILL — ONDANSETRON HCL 8 MG TABLET: 8 | 10 days supply | Qty: 30 | Fill #0

## 2017-06-17 ENCOUNTER — Telehealth: Payer: No Typology Code available for payment source | Admitting: Family

## 2017-06-17 DIAGNOSIS — J029 Acute pharyngitis, unspecified: Secondary | ICD-10-CM

## 2017-06-17 MED ORDER — BENZONATATE 100 MG PO CAPS: 100.0000 mg | ORAL_CAPSULE | Freq: Three times a day (TID) | ORAL | 0 refills | Status: DC | PRN

## 2017-06-17 MED ORDER — PREDNISONE 5 MG PO TABS: 5.0000 mg | ORAL_TABLET | ORAL | 0 refills | Status: DC

## 2017-06-17 MED FILL — predniSONE 5 MG TABS: 5 | 6 days supply | Qty: 21 | Fill #0

## 2017-06-17 MED FILL — BENZONATATE 100 MG CAP: 100 | 5 days supply | Qty: 30 | Fill #0

## 2017-06-17 NOTE — Progress Notes (Signed)
Thank you for the details you included in the comment boxes. Those details are very helpful in determining the best course of treatment for you and help [place] to provide the best care.  We are sorry that you are not feeling well.  Here is how we plan to help!  Based on your presentation I believe you most likely have A cough due to a virus.  This is called viral bronchitis and is best treated by rest, plenty of fluids and control of the cough.  You may use Ibuprofen or Tylenol as directed to help your symptoms.     In addition you may use A non-prescription cough medication called Mucinex DM: take 2 tablets every 12 hours. and A prescription cough medication called Tessalon Perles 100mg . You may take 1-2 capsules every 8 hours as needed for your cough.  Prednisone 5 mg daily for 6 days (see taper instructions below)  You may also use Flonase 55mcg for your sinus congestion, 2 sprays in each nare once daily (I have also sent this to your pharmacy).   From your responses in the eVisit questionnaire you describe inflammation in the upper respiratory tract which is causing a significant cough.  This is commonly called Bronchitis and has four common causes:    Allergies  Viral Infections  Acid Reflux  Bacterial Infection Allergies, viruses and acid reflux are treated by controlling symptoms or eliminating the cause. An example might be a cough caused by taking certain blood pressure medications. You stop the cough by changing the medication. Another example might be a cough caused by acid reflux. Controlling the reflux helps control the cough.  USE OF BRONCHODILATOR ("RESCUE") INHALERS: There is a risk from using your bronchodilator too frequently.  The risk is that over-reliance on a medication which only relaxes the muscles surrounding the breathing tubes can reduce the effectiveness of medications prescribed to reduce swelling and congestion of the tubes themselves.  Although you feel brief relief  from the bronchodilator inhaler, your asthma may actually be worsening with the tubes becoming more swollen and filled with mucus.  This can delay other crucial treatments, such as oral steroid medications. If you need to use a bronchodilator inhaler daily, several times per day, you should discuss this with your provider.  There are probably better treatments that could be used to keep your asthma under control.     HOME CARE . Only take medications as instructed by your medical team. . Complete the entire course of an antibiotic. . Drink plenty of fluids and get plenty of rest. . Avoid close contacts especially the very young and the elderly . Cover your mouth if you cough or cough into your sleeve. . Always remember to wash your hands . A steam or ultrasonic humidifier can help congestion.   GET HELP RIGHT AWAY IF: . You develop worsening fever. . You become short of breath . You cough up blood. . Your symptoms persist after you have completed your treatment plan MAKE SURE YOU   Understand these instructions.  Will watch your condition.  Will get help right away if you are not doing well or get worse.  Your e-visit answers were reviewed by a board certified advanced clinical practitioner to complete your personal care plan.  Depending on the condition, your plan could have included both over the counter or prescription medications. If there is a problem please reply  once you have received a response from your provider. Your safety is important to  [place].  If you have drug allergies check your prescription carefully.    You can use MyChart to ask questions about today's visit, request a non-urgent call back, or ask for a work or school excuse for 24 hours related to this e-Visit. If it has been greater than 24 hours you will need to follow up with your provider, or enter a new e-Visit to address those concerns. You will get an e-mail in the next two days asking about your experience.  I  hope that your e-visit has been valuable and will speed your recovery. Thank you for using e-visits.

## 2017-07-01 ENCOUNTER — Other Ambulatory Visit: Payer: Self-pay | Admitting: Family Medicine

## 2017-07-06 ENCOUNTER — Encounter: Payer: No Typology Code available for payment source | Admitting: Internal Medicine

## 2017-07-12 MED FILL — clonazePAM 0.5 MG TBDP: 0.5 | 30 days supply | Qty: 60 | Fill #2

## 2017-07-12 MED FILL — ZOLPIDEM TART ER 12.5 MG TA: 12.5 | 30 days supply | Qty: 30 | Fill #2

## 2017-07-21 ENCOUNTER — Telehealth: Payer: No Typology Code available for payment source | Admitting: Family

## 2017-07-21 DIAGNOSIS — H9209 Otalgia, unspecified ear: Secondary | ICD-10-CM

## 2017-07-21 DIAGNOSIS — H9202 Otalgia, left ear: Secondary | ICD-10-CM

## 2017-07-21 DIAGNOSIS — H6690 Otitis media, unspecified, unspecified ear: Secondary | ICD-10-CM

## 2017-07-21 MED ORDER — AMOXICILLIN-POT CLAVULANATE 875-125 MG PO TABS: 1.0000 | ORAL_TABLET | Freq: Two times a day (BID) | ORAL | 0 refills | Status: DC

## 2017-07-21 MED FILL — AMOX TR-K CLV 875-125 MG TA: 875-125 | 10 days supply | Qty: 20 | Fill #0

## 2017-07-21 NOTE — Progress Notes (Signed)
E Visit for Swimmer's Ear  We are sorry that you are not feeling well. Here is how we plan to help!  Based on what you have told me you may have a bacterial infection.  I have prescribed an oral antibiotic: and Augmentin 875mg  one tablet by mouth twice a day for 10 days  In certain cases swimmer's ear may progress to a more serious bacterial infection of the middle or inner ear.  If you have a fever 102 and up and significantly worsening symptoms, this could indicate a more serious infection moving to the middle/inner and needs face to face evaluation in an office by a provider.  Your symptoms should improve over the next 3 days and should resolve in about 7 days.  HOME CARE:   Wash your hands frequently.  Do not place the tip of the bottle on your ear or touch it with your fingers.  You can take Acetominophen 650 mg every 4-6 hours as needed for pain.  If pain is severe or moderate, you can apply a heating pad (set on low) or hot water bottle (wrapped in a towel) to outer ear for 20 minutes.  This will also increase drainage.  Avoid ear plugs  Do not use Q-tips  After showers, help the water run out by tilting your head to one side.  GET HELP RIGHT AWAY IF:   Fever is over 102.2 degrees.  You develop progressive ear pain or hearing loss.  Ear symptoms persist longer than 3 days after treatment.  MAKE SURE YOU:   Understand these instructions.  Will watch your condition.  Will get help right away if you are not doing well or get worse.  TO PREVENT SWIMMER'S EAR:  Use a bathing cap or custom fitted swim molds to keep your ears dry.  Towel off after swimming to dry your ears.  Tilt your head or pull your earlobes to allow the water to escape your ear canal.  If there is still water in your ears, consider using a hairdryer on the lowest setting.  Thank you for choosing an e-visit. Your e-visit answers were reviewed by a board certified advanced clinical practitioner  to complete your personal care plan. Depending upon the condition, your plan could have included both over the counter or prescription medications. Please review your pharmacy choice. Be sure that the pharmacy you have chosen is open so that you can pick up your prescription now.  If there is a problem you may message your provider in [place] to have the prescription routed to another pharmacy. Your safety is important to [place]. If you have drug allergies check your prescription carefully.  For the next 24 hours, you can use MyChart to ask questions about today's visit, request a non-urgent call back, or ask for a work or school excuse from your e-visit provider. You will get an email in the next two days asking about your experience. I hope that your e-visit has been valuable and will speed your recovery.

## 2017-07-21 NOTE — Progress Notes (Signed)
Error

## 2017-07-22 MED FILL — ONDANSETRON HCL 8 MG TABLET: 8 | 10 days supply | Qty: 30 | Fill #1

## 2017-08-09 ENCOUNTER — Encounter: Payer: Self-pay | Admitting: Family Medicine

## 2017-08-09 ENCOUNTER — Encounter: Payer: No Typology Code available for payment source | Admitting: Internal Medicine

## 2017-08-10 ENCOUNTER — Other Ambulatory Visit: Payer: Self-pay

## 2017-08-10 MED ORDER — ZOLPIDEM TARTRATE ER 12.5 MG PO TBCR: 12.5000 mg | EXTENDED_RELEASE_TABLET | Freq: Every evening | ORAL | 2 refills | Status: DC | PRN

## 2017-08-10 MED ORDER — CLONAZEPAM 0.5 MG PO TBDP: ORAL_TABLET | ORAL | 2 refills | Status: DC

## 2017-08-10 MED FILL — clonazePAM 0.5 MG TABS: 0.5 | 30 days supply | Qty: 60 | Fill #0

## 2017-08-10 MED FILL — ZOLPIDEM TART ER 12.5 MG TA: 12.5 | 30 days supply | Qty: 30 | Fill #0

## 2017-08-11 ENCOUNTER — Telehealth: Payer: Self-pay | Admitting: Behavioral Health

## 2017-08-11 ENCOUNTER — Encounter: Payer: Self-pay | Admitting: Family Medicine

## 2017-08-11 NOTE — Telephone Encounter (Signed)
Called to follow-up with patient regarding appointment request & concerns about blood sugar & dizziness. Also, patient mentioned the need to have a physical completed for his insurance. Unable to reach patient at this time. Left a detailed message on voicemail & requested a call back. Currently, PCP has availability on 08/12/16 at 1:15 PM.

## 2017-08-25 ENCOUNTER — Encounter: Payer: No Typology Code available for payment source | Admitting: Family Medicine

## 2017-08-25 ENCOUNTER — Ambulatory Visit (INDEPENDENT_AMBULATORY_CARE_PROVIDER_SITE_OTHER): Payer: No Typology Code available for payment source | Admitting: Family Medicine

## 2017-08-25 ENCOUNTER — Encounter: Payer: Self-pay | Admitting: Family Medicine

## 2017-08-25 VITALS — BP 128/74 | HR 75 | Temp 98.7°F | Ht 68.5 in | Wt 197.2 lb

## 2017-08-25 DIAGNOSIS — E559 Vitamin D deficiency, unspecified: Secondary | ICD-10-CM | POA: Diagnosis not present

## 2017-08-25 DIAGNOSIS — F5101 Primary insomnia: Secondary | ICD-10-CM | POA: Diagnosis not present

## 2017-08-25 DIAGNOSIS — K219 Gastro-esophageal reflux disease without esophagitis: Secondary | ICD-10-CM | POA: Diagnosis not present

## 2017-08-25 DIAGNOSIS — Z0000 Encounter for general adult medical examination without abnormal findings: Secondary | ICD-10-CM

## 2017-08-25 DIAGNOSIS — E875 Hyperkalemia: Secondary | ICD-10-CM

## 2017-08-25 LAB — COMPREHENSIVE METABOLIC PANEL
ALBUMIN: 4.3 g/dL (ref 3.5–5.2)
ALT: 25 U/L (ref 0–53)
AST: 22 U/L (ref 0–37)
Alkaline Phosphatase: 44 U/L (ref 39–117)
BUN: 10 mg/dL (ref 6–23)
CALCIUM: 9.3 mg/dL (ref 8.4–10.5)
CO2: 29 mEq/L (ref 19–32)
Chloride: 104 mEq/L (ref 96–112)
Creatinine, Ser: 1 mg/dL (ref 0.40–1.50)
GFR: 85.03 mL/min (ref >60.00)
Glucose, Bld: 119 mg/dL — ABNORMAL HIGH (ref 70–99)
POTASSIUM: 3.9 meq/L (ref 3.5–5.1)
SODIUM: 139 meq/L (ref 135–145)
Total Bilirubin: 0.5 mg/dL (ref 0.2–1.2)
Total Protein: 6.9 g/dL (ref 6.0–8.3)

## 2017-08-25 LAB — VITAMIN D 25 HYDROXY (VIT D DEFICIENCY, FRACTURES): VITD: 16.85 ng/mL — AB (ref 30.00–100.00)

## 2017-08-25 MED ORDER — TRAZODONE HCL 50 MG PO TABS: 25.0000 mg | ORAL_TABLET | Freq: Every evening | ORAL | 3 refills | Status: DC | PRN

## 2017-08-25 MED ORDER — ZALEPLON 10 MG PO CAPS: 10.0000 mg | ORAL_CAPSULE | Freq: Every evening | ORAL | 2 refills | Status: DC | PRN

## 2017-08-25 MED ORDER — VITAMIN D (ERGOCALCIFEROL) 1.25 MG (50000 UNIT) PO CAPS: ORAL_CAPSULE | ORAL | 0 refills | Status: DC

## 2017-08-25 MED FILL — ZALEPLON 10 MG CAPSULE: 10 | 30 days supply | Qty: 30 | Fill #0

## 2017-08-25 MED FILL — traZODone HCL 50 MG TABS: 50 | 30 days supply | Qty: 30 | Fill #0

## 2017-08-25 MED FILL — VIT D2 1.25 MG (50,000 UNIT: 1.25 MG | 84 days supply | Qty: 12 | Fill #0

## 2017-08-25 NOTE — Assessment & Plan Note (Signed)
Total time spent with patient was 40 minutes, with 15 minutes spent specifically on problem visit concerning Insomnia. Greater than 50 percent of the 15 minutes was spent in counseling the Insomnia..  Avoidance of caffeine sources is strongly encouraged. Sleep hygiene issues are reviewed. The use of sedative hypnotics for temporary relief is appropriate but [name] is not an ideal rx and no longer working at maximum dose; we discussed the addictive nature of these drugs. He agrees to a trial of a new insomnia regimen. eRx sent for trazodone 25- 50 mg nightly.  D/c ambien and start sonata to be taken if not asleep 45 minutes after taking trazdone- hopefully once trazodone becomes effective, he will not require sonata nightly. The patient indicates understanding of these issues and agrees with the plan. He will update me in a few weeks.

## 2017-08-25 NOTE — Assessment & Plan Note (Signed)
Reviewed preventive care protocols, scheduled due services, and updated immunizations Discussed nutrition, exercise, diet, and healthy lifestyle.  

## 2017-08-25 NOTE — Assessment & Plan Note (Signed)
Followed by GI, Dr. Hilarie Fredrickson. Symptoms controlled with Protonix 40 mg daily since he has had his hiatal hernia repair.

## 2017-08-25 NOTE — Assessment & Plan Note (Addendum)
Since symptoms improved with high dose weekly vit d, will repeat this dose for 12 weeks. Recheck Vit D.

## 2017-08-25 NOTE — Patient Instructions (Signed)
Great to see you. I will call you with your lab results from today and you can view them online.    We are stopping ambien.  We are starting trazodone 1/2 tab- 1 tab nightly.  If not asleep within 45 minutes, take a sonata.  Keep me updated.  We are restarting high dose Vit D- 50,000 IU weekly x 12 weeks.

## 2017-08-25 NOTE — Progress Notes (Signed)
Subjective:   Patient ID: [name], male    DOB: 11/06/1970, 47 y.o.   MRN: [id number]  [name] is a pleasant 47 y.o. year old male who presents to clinic today with Annual Exam (Patient is here today for a CPE with fasting labs.  Vit-D was low on 4.11.19 at 24.93 and CMP drawn then as well.  PSA/Lipid/CMP/CBC was drawn on 3.13.19.  He is currently fasting.  He agrees to HIV lab draw.  He has not been taking OTC Vit-D since he finished the Rx.  He states he is ready to get rid of  chewing tobacco but needs your help with that.)  on 08/25/2017  HPI:  Health Maintenance  Topic Date Due  . HIV Screening  05/22/1985  . INFLUENZA VACCINE  09/02/2017  . COLONOSCOPY  02/27/2021  . TETANUS/TDAP  08/09/2022   Lab work done in March and April- all looked quite good but potassium was a little high.  He is not taking potassium supplementation. Lab Results  Component Value Date   NA 145 05/13/2017   K 5.5 (H) 05/13/2017   CL 105 05/13/2017   CO2 31 05/13/2017     Lab Results  Component Value Date   PSA 1.87 04/14/2017   Lab Results  Component Value Date   CHOL 124 04/14/2017   HDL 55.60 04/14/2017   LDLCALC 58 04/14/2017   TRIG 52.0 04/14/2017   CHOLHDL 2 04/14/2017     Underwent hiatal hernia repair- Dr. Johney Maine in 01/2017. Since that surgery, he has to eat smaller portions or feels food gets stuck.  Still takes protonix 40 mg daily.  Wt Readings from Last 3 Encounters:  08/25/17 197 lb 3.2 oz (89.4 kg)  04/23/17 211 lb (95.7 kg)  04/14/17 214 lb 6.4 oz (97.3 kg)   Insomnia- on ambien CR 12.5 mg nightly and feels it is no longer working. Difficulty falling and staying asleep. OTC rxs not helping.  While he was taking high dose weekly Vit D, he did feel less fatigued. He wonders if his Vit D is low again.  Current Outpatient Medications on File Prior to Visit  Medication Sig Dispense Refill  . acetaminophen (TYLENOL) 325 MG tablet Take 650 mg by mouth every 6  (six) hours as needed for moderate pain or headache.    . clonazePAM (KLONOPIN) 0.5 MG disintegrating tablet TAKE 1 TABLET BY MOUTH TWICE A DAY AS NEEDED FOR ANXIETY 60 tablet 2  . fluticasone (FLONASE) 50 MCG/ACT nasal spray Place 2 sprays into both nostrils daily. 16 g 6  . HYDROcodone-acetaminophen (NORCO/VICODIN) 5-325 MG tablet Take 1-2 tablets by mouth every 6 (six) hours as needed for moderate pain or severe pain. 30 tablet 0  . ondansetron (ZOFRAN) 8 MG tablet 1/2 to 1 full tablet as needed for nausea every 8 hours 30 tablet 2  . pantoprazole (PROTONIX) 40 MG tablet Take 1 tablet (40 mg total) by mouth 2 (two) times daily. 180 tablet 2   No current facility-administered medications on file prior to visit.     Allergies  Allergen Reactions  . Dilaudid [Hydromorphone Hcl] Rash and Other (See Comments)    Shakey  . Metoclopramide Hcl Hives, Swelling, Rash and Other (See Comments)    Swelling in lips  . Nsaids     H/o bleeding gastric ulcers    Past Medical History:  Diagnosis Date  . Anxiety    takes Xanax daily prn  . Chronic gastric ulcer with bleeding  s/p suture repair 2012 09/29/2010   Head 3 EGD which failed to stop the bleeding. Had exploratory laparotomy on 10/04/2010. Possible cause of the gastric ulcer was NSAID use.   . Clotting disorder ([place])   . Constipation    related to pain meds  . Dizziness    occasionally  . Erosive esophagitis   . External hemorrhoid, bleeding   . Gastric ulcer    history of  . GERD (gastroesophageal reflux disease)    takes Protonix bid  . Hiatal hernia   . History of blood clots 2012   in abdomen  . History of blood transfusion 2012  . Incisional hernia s/p open repair w mesh Aug 2013 05/26/2011  . Joint pain    knees  . Nocturia    depends on amount of fluid he drinks  . Pneumonia   . Pre-diabetes   . Primary localized osteoarthritis of left hip 04/24/2014  . Serrated polyp of colon   . Sleep apnea    no cpap  mild no cpap  needed  . Upper GI bleed   . Ventral hernia     Past Surgical History:  Procedure Laterality Date  . COLONOSCOPY    . ESOPHAGEAL MANOMETRY N/A 10/19/2016   Procedure: ESOPHAGEAL MANOMETRY (EM);  Surgeon: Mauri Pole, MD;  Location: [facility];  Service: Endoscopy;  Laterality: N/A;  . ESOPHAGOGASTRODUODENOSCOPY    . ESOPHAGOGASTRODUODENOSCOPY N/A 01/22/2017   Procedure: ESOPHAGOGASTRODUODENOSCOPY (EGD);  Surgeon: [name], MD;  Location: [facility];  Service: General;  Laterality: N/A;  . INCISIONAL HERNIA REPAIR  09/04/2011   Procedure: HERNIA REPAIR INCISIONAL;  Surgeon: Madilyn Hook, DO;  Location: [place];  Service: General;  Laterality: N/A;  debridment calcified mass  . INSERTION OF MESH  09/04/2011   retrorectus ultrapro "30x30cm"  . INSERTION OF MESH N/A 01/22/2017   Procedure: INSERTION OF MESH;  Surgeon: [name], MD;  Location: [facility];  Service: General;  Laterality: N/A;  . NISSEN FUNDOPLICATION     Open 3662H  . POLYPECTOMY    . STOMACH SURGERY  10/05/2010   Oversewing of gastric ulcer.  Dr Brantley Stage  . TOTAL HIP ARTHROPLASTY Left 04/24/2014   Procedure: LEFT TOTAL HIP ARTHROPLASTY;  Surgeon: Marchia Bond, MD;  Location: [place];  Service: Orthopedics;  Laterality: Left;  . UPPER GASTROINTESTINAL ENDOSCOPY      Family History  Problem Relation Age of Onset  . Diabetes Mother   . Liver disease Mother   . Diabetes Father   . Heart disease Father   . Colon cancer Neg Hx   . Stomach cancer Neg Hx   . Rectal cancer Neg Hx   . Esophageal cancer Neg Hx   . Colon polyps Neg Hx     Social History   Socioeconomic History  . Marital status: Single    Spouse name: Not on file  . Number of children: 1  . Years of education: Not on file  . Highest education level: Not on file  Occupational History  . Occupation: Maintenance - [employer]  Social Needs  . Financial resource strain: Not on file  . Food insecurity:    Worry: Not on file    Inability: Not on  file  . Transportation needs:    Medical: Not on file    Non-medical: Not on file  Tobacco Use  . Smoking status: Never Smoker  . Smokeless tobacco: Current User    Types: Chew  . Tobacco comment: occ-Chew  Substance  and Sexual Activity  . Alcohol use: Yes    Alcohol/week: 0.0 oz    Comment: 09/04/11 "very seldom"  . Drug use: No  . Sexual activity: Yes  Lifestyle  . Physical activity:    Days per week: Not on file    Minutes per session: Not on file  . Stress: Not on file  Relationships  . Social connections:    Talks on phone: Not on file    Gets together: Not on file    Attends religious service: Not on file    Active member of club or organization: Not on file    Attends meetings of clubs or organizations: Not on file    Relationship status: Not on file  . Intimate partner violence:    Fear of current or ex partner: Not on file    Emotionally abused: Not on file    Physically abused: Not on file    Forced sexual activity: Not on file  Other Topics Concern  . Not on file  Social History Narrative  . Not on file   The PMH, PSH, Social History, Family History, Medications, and allergies have been reviewed in [facility], and have been updated if relevant.    Review of Systems  Constitutional: Positive for fatigue.  HENT: Negative.   Eyes: Negative.   Respiratory: Negative.   Cardiovascular: Negative.   Gastrointestinal: Positive for abdominal pain. Negative for abdominal distention and anal bleeding.  Endocrine: Negative.   Genitourinary: Negative.   Musculoskeletal: Negative.   Skin: Negative.   Allergic/Immunologic: Negative.   Hematological: Negative.   Psychiatric/Behavioral: Positive for sleep disturbance. Negative for decreased concentration. The patient is not nervous/anxious.   All other systems reviewed and are negative.      Objective:    BP 128/74 (BP Location: Left Arm, Patient Position: Sitting, Cuff Size: Normal)   Pulse 75   Temp 98.7 F (37.1 C)  (Oral)   Ht 5' 8.5" (1.74 m)   Wt 197 lb 3.2 oz (89.4 kg)   SpO2 97%   BMI 29.55 kg/m    Physical Exam General:  pleasant male in no acute distress Eyes:  PERRL Ears:  External ear exam shows no significant lesions or deformities.  TMs normal bilaterally Hearing is grossly normal bilaterally. Nose:  External nasal examination shows no deformity or inflammation. Nasal mucosa are pink and moist without lesions or exudates. Mouth:  Oral mucosa and oropharynx without lesions or exudates.  Teeth in good repair. Neck:  no carotid bruit or thyromegaly no cervical or supraclavicular lymphadenopathy  Lungs:  Normal respiratory effort, chest expands symmetrically. Lungs are clear to auscultation, no crackles or wheezes. Heart:  Normal rate and regular rhythm. S1 and S2 normal without gallop, murmur, click, rub or other extra sounds. Abdomen:  Bowel sounds positive,abdomen soft but remains tender to palpation around old hernia surgical site (this has remained unchanged since 01/2017). Prostate:  Prostate gland firm and smooth, 1 plus enlargement, no nodularity, tenderness, mass, asymmetry or induration. Pulses:  R and L posterior tibial pulses are full and equal bilaterally  Extremities:  no edema  Psych:  Good eye contact, not anxious or depressed appearing      Assessment & Plan:   Visit for well man health check  Vitamin D deficiency - Plan: Vitamin D (25 hydroxy)  Primary insomnia  Gastroesophageal reflux disease without esophagitis  Hyperkalemia - Plan: Comprehensive metabolic panel No follow-ups on file.

## 2017-08-25 NOTE — Assessment & Plan Note (Signed)
Repeat electrolytes (CMET ) today.

## 2017-08-26 ENCOUNTER — Telehealth: Payer: Self-pay

## 2017-08-26 NOTE — Telephone Encounter (Signed)
Patient is scheduled for a lab visit for a POCT A1C in Aug per Dr. Aron/thx dmf

## 2017-09-01 MED FILL — ONDANSETRON HCL 8 MG TABLET: 8 | 10 days supply | Qty: 30 | Fill #2

## 2017-09-06 MED FILL — clonazePAM 0.5 MG TABS: 0.5 | 30 days supply | Qty: 60 | Fill #1

## 2017-09-06 MED FILL — ZOLPIDEM TART ER 12.5 MG TA: 12.5 | 30 days supply | Qty: 30 | Fill #1

## 2017-09-15 ENCOUNTER — Telehealth: Payer: No Typology Code available for payment source | Admitting: Family Medicine

## 2017-09-15 DIAGNOSIS — H60332 Swimmer's ear, left ear: Secondary | ICD-10-CM | POA: Diagnosis not present

## 2017-09-15 MED ORDER — CIPROFLOXACIN-HYDROCORTISONE 0.2-1 % OT SUSP: 3.0000 [drp] | Freq: Two times a day (BID) | OTIC | 0 refills | Status: AC

## 2017-09-15 MED ORDER — NEOMYCIN-POLYMYXIN-HC 3.5-10000-1 OT SOLN: 4.0000 [drp] | Freq: Four times a day (QID) | OTIC | 0 refills | Status: AC

## 2017-09-15 MED FILL — NEO/POLYMYXIN/HC EAR SUSP: 3.5-10000-1 | 7 days supply | Qty: 10 | Fill #0

## 2017-09-15 NOTE — Addendum Note (Signed)
Addended by: Shella Maxim on: 09/15/2017 04:45 PM   Modules accepted: Orders

## 2017-09-15 NOTE — Progress Notes (Signed)
E Visit for Swimmer's Ear  We are sorry that you are not feeling well. Here is how we plan to help!  I have prescribed: Neomycin 0.35%, polymyxin B 10,000 units/mL, and hydrocortisone 0,5% otic solution 4 drops in affected ears four times a day until completed  The previous prescription was $100 and the pharmacy called to discuss other options that may help that are on the formulary- you will have to use this drup a little more frequently but it should work the same way.

## 2017-09-15 NOTE — Progress Notes (Signed)
E Visit for Swimmer's Ear  We are sorry that you are not feeling well. Here is how we plan to help!  I have prescribed: Ciprofloxin 0.2% and hydrocortisone 1% otic suspension 3 drops in affected ears twice daily until completed   In certain cases swimmer's ear may progress to a more serious bacterial infection of the middle or inner ear.  If you have a fever 102 and up and significantly worsening symptoms, this could indicate a more serious infection moving to the middle/inner and needs face to face evaluation in an office by a provider.  Your symptoms should improve over the next 3 days and should resolve in about 7 days.  HOME CARE:   Wash your hands frequently.  Do not place the tip of the bottle on your ear or touch it with your fingers.  You can take Acetominophen 650 mg every 4-6 hours as needed for pain.  If pain is severe or moderate, you can apply a heating pad (set on low) or hot water bottle (wrapped in a towel) to outer ear for 20 minutes.  This will also increase drainage.  Avoid ear plugs  Do not use Q-tips  After showers, help the water run out by tilting your head to one side.  GET HELP RIGHT AWAY IF:   Fever is over 102.2 degrees.  You develop progressive ear pain or hearing loss.  Ear symptoms persist longer than 3 days after treatment.  MAKE SURE YOU:   Understand these instructions.  Will watch your condition.  Will get help right away if you are not doing well or get worse.  TO PREVENT SWIMMER'S EAR:  Use a bathing cap or custom fitted swim molds to keep your ears dry.  Towel off after swimming to dry your ears.  Tilt your head or pull your earlobes to allow the water to escape your ear canal.  If there is still water in your ears, consider using a hairdryer on the lowest setting.  Thank you for choosing an e-visit. Your e-visit answers were reviewed by a board certified advanced clinical practitioner to complete your personal care plan.  Depending upon the condition, your plan could have included both over the counter or prescription medications. Please review your pharmacy choice. Be sure that the pharmacy you have chosen is open so that you can pick up your prescription now.  If there is a problem you may message your provider in [place] to have the prescription routed to another pharmacy. Your safety is important to [place]. If you have drug allergies check your prescription carefully.  For the next 24 hours, you can use MyChart to ask questions about today's visit, request a non-urgent call back, or ask for a work or school excuse from your e-visit provider. You will get an email in the next two days asking about your experience. I hope that your e-visit has been valuable and will speed your recovery.

## 2017-09-27 MED FILL — traZODone HCL 50 MG TABS: 50 | 30 days supply | Qty: 30 | Fill #1

## 2017-09-27 MED FILL — ZALEPLON 10 MG CAPSULE: 10 | 30 days supply | Qty: 30 | Fill #1

## 2017-09-30 ENCOUNTER — Other Ambulatory Visit (INDEPENDENT_AMBULATORY_CARE_PROVIDER_SITE_OTHER): Payer: No Typology Code available for payment source

## 2017-09-30 DIAGNOSIS — R7309 Other abnormal glucose: Secondary | ICD-10-CM | POA: Diagnosis not present

## 2017-09-30 DIAGNOSIS — E559 Vitamin D deficiency, unspecified: Secondary | ICD-10-CM | POA: Diagnosis not present

## 2017-09-30 LAB — VITAMIN D 25 HYDROXY (VIT D DEFICIENCY, FRACTURES): VITD: 24.42 ng/mL — ABNORMAL LOW (ref 30.00–100.00)

## 2017-09-30 LAB — HEMOGLOBIN A1C: Hgb A1c MFr Bld: 6 % (ref 4.6–6.5)

## 2017-10-01 ENCOUNTER — Other Ambulatory Visit: Payer: Self-pay

## 2017-10-01 MED ORDER — VITAMIN D (ERGOCALCIFEROL) 1.25 MG (50000 UNIT) PO CAPS: ORAL_CAPSULE | ORAL | 0 refills | Status: DC

## 2017-10-05 ENCOUNTER — Ambulatory Visit (HOSPITAL_COMMUNITY)
Admission: EM | Admit: 2017-10-05 | Discharge: 2017-10-05 | Disposition: A | Discharge status: Home / Self Care | Payer: No Typology Code available for payment source | Source: Home / Self Care

## 2017-10-05 ENCOUNTER — Encounter (HOSPITAL_COMMUNITY): Payer: Self-pay | Admitting: Emergency Medicine

## 2017-10-05 ENCOUNTER — Emergency Department (HOSPITAL_COMMUNITY): Payer: No Typology Code available for payment source

## 2017-10-05 ENCOUNTER — Inpatient Hospital Stay (HOSPITAL_COMMUNITY)
Admission: EM | Admit: 2017-10-05 | Discharge: 2017-10-07 | DRG: 065 | Disposition: A | Discharge status: Home / Self Care | Payer: No Typology Code available for payment source | Attending: Internal Medicine | Admitting: Internal Medicine

## 2017-10-05 ENCOUNTER — Other Ambulatory Visit: Payer: Self-pay

## 2017-10-05 DIAGNOSIS — R42 Dizziness and giddiness: Secondary | ICD-10-CM

## 2017-10-05 DIAGNOSIS — Z8711 Personal history of peptic ulcer disease: Secondary | ICD-10-CM

## 2017-10-05 DIAGNOSIS — Z6829 Body mass index (BMI) 29.0-29.9, adult: Secondary | ICD-10-CM

## 2017-10-05 DIAGNOSIS — D72829 Elevated white blood cell count, unspecified: Secondary | ICD-10-CM | POA: Diagnosis present

## 2017-10-05 DIAGNOSIS — Z8249 Family history of ischemic heart disease and other diseases of the circulatory system: Secondary | ICD-10-CM

## 2017-10-05 DIAGNOSIS — I639 Cerebral infarction, unspecified: Secondary | ICD-10-CM | POA: Diagnosis not present

## 2017-10-05 DIAGNOSIS — Z79899 Other long term (current) drug therapy: Secondary | ICD-10-CM

## 2017-10-05 DIAGNOSIS — D689 Coagulation defect, unspecified: Secondary | ICD-10-CM | POA: Diagnosis present

## 2017-10-05 DIAGNOSIS — R4781 Slurred speech: Secondary | ICD-10-CM | POA: Diagnosis present

## 2017-10-05 DIAGNOSIS — Z96642 Presence of left artificial hip joint: Secondary | ICD-10-CM | POA: Diagnosis present

## 2017-10-05 DIAGNOSIS — G8191 Hemiplegia, unspecified affecting right dominant side: Secondary | ICD-10-CM | POA: Diagnosis present

## 2017-10-05 DIAGNOSIS — R531 Weakness: Secondary | ICD-10-CM | POA: Diagnosis present

## 2017-10-05 DIAGNOSIS — Z885 Allergy status to narcotic agent status: Secondary | ICD-10-CM

## 2017-10-05 DIAGNOSIS — E785 Hyperlipidemia, unspecified: Secondary | ICD-10-CM | POA: Diagnosis present

## 2017-10-05 DIAGNOSIS — R7303 Prediabetes: Secondary | ICD-10-CM | POA: Diagnosis present

## 2017-10-05 DIAGNOSIS — Z888 Allergy status to other drugs, medicaments and biological substances status: Secondary | ICD-10-CM

## 2017-10-05 DIAGNOSIS — E663 Overweight: Secondary | ICD-10-CM | POA: Diagnosis present

## 2017-10-05 DIAGNOSIS — Z8673 Personal history of transient ischemic attack (TIA), and cerebral infarction without residual deficits: Secondary | ICD-10-CM | POA: Diagnosis present

## 2017-10-05 DIAGNOSIS — G4733 Obstructive sleep apnea (adult) (pediatric): Secondary | ICD-10-CM | POA: Diagnosis present

## 2017-10-05 DIAGNOSIS — F419 Anxiety disorder, unspecified: Secondary | ICD-10-CM | POA: Diagnosis not present

## 2017-10-05 DIAGNOSIS — Z86010 Personal history of colonic polyps: Secondary | ICD-10-CM

## 2017-10-05 DIAGNOSIS — I6300 Cerebral infarction due to thrombosis of unspecified precerebral artery: Secondary | ICD-10-CM

## 2017-10-05 DIAGNOSIS — H9190 Unspecified hearing loss, unspecified ear: Secondary | ICD-10-CM | POA: Diagnosis present

## 2017-10-05 DIAGNOSIS — K219 Gastro-esophageal reflux disease without esophagitis: Secondary | ICD-10-CM | POA: Diagnosis not present

## 2017-10-05 DIAGNOSIS — R29703 NIHSS score 3: Secondary | ICD-10-CM | POA: Diagnosis present

## 2017-10-05 DIAGNOSIS — Z833 Family history of diabetes mellitus: Secondary | ICD-10-CM

## 2017-10-05 DIAGNOSIS — Z8719 Personal history of other diseases of the digestive system: Secondary | ICD-10-CM | POA: Diagnosis not present

## 2017-10-05 DIAGNOSIS — F17220 Nicotine dependence, chewing tobacco, uncomplicated: Secondary | ICD-10-CM | POA: Diagnosis present

## 2017-10-05 DIAGNOSIS — Z86718 Personal history of other venous thrombosis and embolism: Secondary | ICD-10-CM

## 2017-10-05 LAB — COMPREHENSIVE METABOLIC PANEL
ALK PHOS: 50 U/L (ref 38–126)
ALT: 24 U/L (ref 0–44)
ANION GAP: 10 (ref 5–15)
AST: 32 U/L (ref 15–41)
Albumin: 4.3 g/dL (ref 3.5–5.0)
BUN: 16 mg/dL (ref 6–20)
CALCIUM: 9.5 mg/dL (ref 8.9–10.3)
CO2: 25 mmol/L (ref 22–32)
CREATININE: 1.07 mg/dL (ref 0.61–1.24)
Chloride: 104 mmol/L (ref 98–111)
GFR calc non Af Amer: >60 mL/min (ref >60)
GLUCOSE: 92 mg/dL (ref 70–99)
POTASSIUM: 4.7 mmol/L (ref 3.5–5.1)
Sodium: 139 mmol/L (ref 135–145)
TOTAL PROTEIN: 6.4 g/dL — AB (ref 6.5–8.1)
Total Bilirubin: 0.8 mg/dL (ref 0.3–1.2)

## 2017-10-05 LAB — CBC
HCT: 43.1 % (ref 39.0–52.0)
Hemoglobin: 14 g/dL (ref 13.0–17.0)
MCH: 30.7 pg (ref 26.0–34.0)
MCHC: 32.5 g/dL (ref 30.0–36.0)
MCV: 94.5 fL (ref 78.0–100.0)
PLATELETS: 359 10*3/uL (ref 150–400)
RBC: 4.56 MIL/uL (ref 4.22–5.81)
RDW: 13.2 % (ref 11.5–15.5)
WBC: 11.6 10*3/uL — ABNORMAL HIGH (ref 4.0–10.5)

## 2017-10-05 LAB — I-STAT TROPONIN, ED: Troponin i, poc: 0.02 ng/mL (ref 0.00–0.08)

## 2017-10-05 LAB — DIFFERENTIAL
Abs Immature Granulocytes: 0.1 10*3/uL (ref 0.0–0.1)
Basophils Absolute: 0.1 10*3/uL (ref 0.0–0.1)
Basophils Relative: 0 %
EOS ABS: 0.2 10*3/uL (ref 0.0–0.7)
EOS PCT: 2 %
Immature Granulocytes: 1 %
LYMPHS PCT: 18 %
Lymphs Abs: 2.1 10*3/uL (ref 0.7–4.0)
MONO ABS: 0.9 10*3/uL (ref 0.1–1.0)
Monocytes Relative: 8 %
NEUTROS PCT: 71 %
Neutro Abs: 8.3 10*3/uL — ABNORMAL HIGH (ref 1.7–7.7)

## 2017-10-05 LAB — I-STAT CHEM 8, ED
BUN: 19 mg/dL (ref 6–20)
CALCIUM ION: 1.18 mmol/L (ref 1.15–1.40)
CREATININE: 1.1 mg/dL (ref 0.61–1.24)
Chloride: 105 mmol/L (ref 98–111)
GLUCOSE: 93 mg/dL (ref 70–99)
HCT: 42 % (ref 39.0–52.0)
Hemoglobin: 14.3 g/dL (ref 13.0–17.0)
Potassium: 4.3 mmol/L (ref 3.5–5.1)
Sodium: 138 mmol/L (ref 135–145)
TCO2: 26 mmol/L (ref 22–32)

## 2017-10-05 LAB — APTT: aPTT: 30 seconds (ref 24–36)

## 2017-10-05 LAB — PROTIME-INR
INR: 0.92
PROTHROMBIN TIME: 12.2 s (ref 11.4–15.2)

## 2017-10-05 MED ORDER — ACETAMINOPHEN 325 MG PO TABS
650.0000 mg | ORAL_TABLET | Freq: Once | ORAL | Status: AC
  Administered 2017-10-05: 650 mg via ORAL
  Filled 2017-10-05: qty 2

## 2017-10-05 NOTE — ED Provider Notes (Signed)
Assumed care from Dr. Johnney Killian at shift change.  See prior notes for full H&P.  Briefly, 47 y.o. M here with Right sided weakness that began 2 days ago.  Also has some dizziness which he describes as room spinning.  Labs and head CT thus far reassuring.  Plan:  MRI pending and neurology consult.  Results for orders placed or performed during the hospital encounter of 10/05/17  Protime-INR  Result Value Ref Range   Prothrombin Time 12.2 11.4 - 15.2 seconds   INR 0.92   APTT  Result Value Ref Range   aPTT 30 24 - 36 seconds  CBC  Result Value Ref Range   WBC 11.6 (H) 4.0 - 10.5 K/uL   RBC 4.56 4.22 - 5.81 MIL/uL   Hemoglobin 14.0 13.0 - 17.0 g/dL   HCT 43.1 39.0 - 52.0 %   MCV 94.5 78.0 - 100.0 fL   MCH 30.7 26.0 - 34.0 pg   MCHC 32.5 30.0 - 36.0 g/dL   RDW 13.2 11.5 - 15.5 %   Platelets 359 150 - 400 K/uL  Differential  Result Value Ref Range   Neutrophils Relative % 71 %   Neutro Abs 8.3 (H) 1.7 - 7.7 K/uL   Lymphocytes Relative 18 %   Lymphs Abs 2.1 0.7 - 4.0 K/uL   Monocytes Relative 8 %   Monocytes Absolute 0.9 0.1 - 1.0 K/uL   Eosinophils Relative 2 %   Eosinophils Absolute 0.2 0.0 - 0.7 K/uL   Basophils Relative 0 %   Basophils Absolute 0.1 0.0 - 0.1 K/uL   Immature Granulocytes 1 %   Abs Immature Granulocytes 0.1 0.0 - 0.1 K/uL  Comprehensive metabolic panel  Result Value Ref Range   Sodium 139 135 - 145 mmol/L   Potassium 4.7 3.5 - 5.1 mmol/L   Chloride 104 98 - 111 mmol/L   CO2 25 22 - 32 mmol/L   Glucose, Bld 92 70 - 99 mg/dL   BUN 16 6 - 20 mg/dL   Creatinine, Ser 1.07 0.61 - 1.24 mg/dL   Calcium 9.5 8.9 - 10.3 mg/dL   Total Protein 6.4 (L) 6.5 - 8.1 g/dL   Albumin 4.3 3.5 - 5.0 g/dL   AST 32 15 - 41 U/L   ALT 24 0 - 44 U/L   Alkaline Phosphatase 50 38 - 126 U/L   Total Bilirubin 0.8 0.3 - 1.2 mg/dL   GFR calc non Af Amer >60 >60 mL/min   GFR calc Af Amer >60 >60 mL/min   Anion gap 10 5 - 15  I-stat troponin, ED  Result Value Ref Range   Troponin i,  poc 0.02 0.00 - 0.08 ng/mL   Comment 3          I-Stat Chem 8, ED  Result Value Ref Range   Sodium 138 135 - 145 mmol/L   Potassium 4.3 3.5 - 5.1 mmol/L   Chloride 105 98 - 111 mmol/L   BUN 19 6 - 20 mg/dL   Creatinine, Ser 1.10 0.61 - 1.24 mg/dL   Glucose, Bld 93 70 - 99 mg/dL   Calcium, Ion 1.18 1.15 - 1.40 mmol/L   TCO2 26 22 - 32 mmol/L   Hemoglobin 14.3 13.0 - 17.0 g/dL   HCT 42.0 39.0 - 52.0 %   Ct Head Wo Contrast  Result Date: 10/05/2017 CLINICAL DATA:  47 y/o  M; 2 days of right-sided weakness. EXAM: CT HEAD WITHOUT CONTRAST TECHNIQUE: Contiguous axial images were obtained from the  base of the skull through the vertex without intravenous contrast. COMPARISON:  None. FINDINGS: Brain: No evidence of acute infarction, hemorrhage, hydrocephalus, extra-axial collection or mass lesion/mass effect. Vascular: No hyperdense vessel or unexpected calcification. Skull: Normal. Negative for fracture or focal lesion. Sinuses/Orbits: No acute finding. Other: None. IMPRESSION: No acute intracranial abnormality identified. Unremarkable CT of the head. Electronically Signed   By: Kristine Garbe M.D.   On: 10/05/2017 17:30   Mr [name] Head Wo Contrast  Result Date: 10/05/2017 CLINICAL DATA:  47 y/o M; right arm weakness, right arm tingling, and right leg tingling for 2 days. EXAM: MRI HEAD WITHOUT CONTRAST MRA HEAD WITHOUT CONTRAST TECHNIQUE: Multiplanar, multiecho pulse sequences of the brain and surrounding structures were obtained without intravenous contrast. Angiographic images of the head were obtained using MRA technique without contrast. COMPARISON:  10/05/2017 CT head FINDINGS: MRI HEAD FINDINGS Brain: Probable punctate focus of reduced diffusion within the left thalamo-mesencephalic junction (series 3, image 24 and series 350, image 24). No additional area of reduced diffusion identified within the brain. No focal mass effect, hemorrhage, extra-axial collection, structural abnormality, or  herniation. Vascular: As below. Skull and upper cervical spine: Normal marrow signal. Sinuses/Orbits: Negative. Other: None. MRA HEAD FINDINGS Internal carotid arteries:  Patent. Anterior cerebral arteries:  Patent. Middle cerebral arteries: Patent. Anterior communicating artery: Patent. Posterior communicating arteries: Not identified, likely hypoplastic or absent. Posterior cerebral arteries:  Patent. Basilar artery:  Patent. Vertebral arteries:  Patent. No evidence of high-grade stenosis, large vessel occlusion, or aneurysm. IMPRESSION: 1. Probable punctate focus of reduced diffusion within the left thalamo-mesencephalic junction. Findings are compatible with a small vessel acute/early subacute infarction. No additional findings for differential considerations of infectious/inflammatory demyelination or vasculitis. 2. Normal MRA of the head. These results were called by telephone at the time of interpretation on 10/05/2017 at 11:06 pm to Sharon, who verbally acknowledged these results. Electronically Signed   By: Kristine Garbe M.D.   On: 10/05/2017 23:12   Mr Brain Wo Contrast  Result Date: 10/05/2017 CLINICAL DATA:  47 y/o M; right arm weakness, right arm tingling, and right leg tingling for 2 days. EXAM: MRI HEAD WITHOUT CONTRAST MRA HEAD WITHOUT CONTRAST TECHNIQUE: Multiplanar, multiecho pulse sequences of the brain and surrounding structures were obtained without intravenous contrast. Angiographic images of the head were obtained using MRA technique without contrast. COMPARISON:  10/05/2017 CT head FINDINGS: MRI HEAD FINDINGS Brain: Probable punctate focus of reduced diffusion within the left thalamo-mesencephalic junction (series 3, image 24 and series 350, image 24). No additional area of reduced diffusion identified within the brain. No focal mass effect, hemorrhage, extra-axial collection, structural abnormality, or herniation. Vascular: As below. Skull and upper cervical spine: Normal  marrow signal. Sinuses/Orbits: Negative. Other: None. MRA HEAD FINDINGS Internal carotid arteries:  Patent. Anterior cerebral arteries:  Patent. Middle cerebral arteries: Patent. Anterior communicating artery: Patent. Posterior communicating arteries: Not identified, likely hypoplastic or absent. Posterior cerebral arteries:  Patent. Basilar artery:  Patent. Vertebral arteries:  Patent. No evidence of high-grade stenosis, large vessel occlusion, or aneurysm. IMPRESSION: 1. Probable punctate focus of reduced diffusion within the left thalamo-mesencephalic junction. Findings are compatible with a small vessel acute/early subacute infarction. No additional findings for differential considerations of infectious/inflammatory demyelination or vasculitis. 2. Normal MRA of the head. These results were called by telephone at the time of interpretation on 10/05/2017 at 11:06 pm to [place], who verbally acknowledged these results. Electronically Signed   By: Edgardo Roys.D.  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  Neurology, Dr. Cheral Marker, was updated.  Patient was updated on results and plan of care.  Patient has significant history of upper GI bleeding secondary to ulcers, will leave it up to neurology discretion whether to start aspirin/Plavix.  Will admit to medicine.    Discussed with hospitalist, Dr. Blaine Hamper-- will admit for ongoing care.   Larene Pickett, PA-C 10/06/17 0102    Merryl Hacker, MD 10/06/17 [id number]

## 2017-10-05 NOTE — ED Triage Notes (Signed)
PT complains of dizziness for 2 days and right arm numbness. PT sent to ED for eval per Stark Klein, PA. PT given information by Loma Sousa, RN and goes to ED

## 2017-10-05 NOTE — ED Triage Notes (Signed)
Patient to ED from [facility] for further evaluation of stroke symptoms - reports R arm weakness, R arm tingling, R leg tingling x 2 days, light sensitivity, and headache. Patient also endorses "room spinning" dizziness that is present at rest and with movement. A&O x 4. R arm drift noted in triage. No facial droop, no speech or visual field changes.

## 2017-10-05 NOTE — ED Notes (Signed)
Pt remains at MRI

## 2017-10-05 NOTE — Consult Note (Signed)
NEURO HOSPITALIST CONSULT NOTE   Requestig physician: Dr. Johnney Killian  Reason for Consult: Right arm numbness and weakness  History obtained from:    Patient and Chart    HPI:                                                                                                                                          [name] is an 47 y.o. male who presented to the ED today with a chief complaint of "room-spinning" dizziness for 2 days with right arm numbness, right arm weakness, right arm paresthesias, right leg paresthesias, headache and light sensitivity. The vertigo is present both at rest and with movement. Triage RN noted RUE drift, but no facial droop, speech changes or visual field defect. An MRI brain was ordered, which on preliminary review of the images reveals no acute infarction. Preliminary review of MRA head reveals no occlusion or hemodynamically significant stenosis.   Official Radiology report resulted, with acute punctate infarct described. Based upon my review of the images, the signal discrepancy is as likely due to background noise as an infarction.   Official MRI/MRA report conclusions: "1. Probable punctate focus of reduced diffusion within the left thalamo-mesencephalic junction. Findings are compatible with a small vessel acute/early subacute infarction. No additional findings for differential considerations of infectious/inflammatory demyelination or vasculitis. 2. Normal MRA of the head."  Past Medical History:  Diagnosis Date  . Anxiety    takes Xanax daily prn  . Chronic gastric ulcer with bleeding s/p suture repair 2012 09/29/2010   Head 3 EGD which failed to stop the bleeding. Had exploratory laparotomy on 10/04/2010. Possible cause of the gastric ulcer was NSAID use.   . Clotting disorder ([place])   . Constipation    related to pain meds  . Dizziness    occasionally  . Erosive esophagitis   . External hemorrhoid, bleeding   . Gastric  ulcer    history of  . GERD (gastroesophageal reflux disease)    takes Protonix bid  . Hiatal hernia   . History of blood clots 2012   in abdomen  . History of blood transfusion 2012  . Incisional hernia s/p open repair w mesh Aug 2013 05/26/2011  . Joint pain    knees  . Nocturia    depends on amount of fluid he drinks  . Pneumonia   . Pre-diabetes   . Primary localized osteoarthritis of left hip 04/24/2014  . Serrated polyp of colon   . Sleep apnea    no cpap  mild no cpap needed  . Upper GI bleed   . Ventral hernia     Past Surgical History:  Procedure Laterality Date  . COLONOSCOPY    . ESOPHAGEAL MANOMETRY N/A 10/19/2016   Procedure:  ESOPHAGEAL MANOMETRY (EM);  Surgeon: Mauri Pole, MD;  Location: [facility];  Service: Endoscopy;  Laterality: N/A;  . ESOPHAGOGASTRODUODENOSCOPY    . ESOPHAGOGASTRODUODENOSCOPY N/A 01/22/2017   Procedure: ESOPHAGOGASTRODUODENOSCOPY (EGD);  Surgeon: [name], MD;  Location: [facility];  Service: General;  Laterality: N/A;  . INCISIONAL HERNIA REPAIR  09/04/2011   Procedure: HERNIA REPAIR INCISIONAL;  Surgeon: Madilyn Hook, DO;  Location: [facility];  Service: General;  Laterality: N/A;  debridment calcified mass  . INSERTION OF MESH  09/04/2011   retrorectus ultrapro "30x30cm"  . INSERTION OF MESH N/A 01/22/2017   Procedure: INSERTION OF MESH;  Surgeon: [name], MD;  Location: [facility];  Service: General;  Laterality: N/A;  . NISSEN FUNDOPLICATION     Open 3976B  . POLYPECTOMY    . STOMACH SURGERY  10/05/2010   Oversewing of gastric ulcer.  Dr Brantley Stage  . TOTAL HIP ARTHROPLASTY Left 04/24/2014   Procedure: LEFT TOTAL HIP ARTHROPLASTY;  Surgeon: Marchia Bond, MD;  Location: [facility];  Service: Orthopedics;  Laterality: Left;  . UPPER GASTROINTESTINAL ENDOSCOPY      Family History  Problem Relation Age of Onset  . Diabetes Mother   . Liver disease Mother   . Diabetes Father   . Heart disease Father   . Colon cancer Neg Hx   .  Stomach cancer Neg Hx   . Rectal cancer Neg Hx   . Esophageal cancer Neg Hx   . Colon polyps Neg Hx               Social History:  reports that he has never smoked. His smokeless tobacco use includes chew. He reports that he drinks alcohol. He reports that he does not use drugs.  Allergies  Allergen Reactions  . Dilaudid [Hydromorphone Hcl] Rash and Other (See Comments)    Shakey  . Metoclopramide Hcl Hives, Swelling, Rash and Other (See Comments)    Swelling in lips  . Nsaids     H/o bleeding gastric ulcers    MEDICATIONS:                                                                                                                     Current Meds  Medication Sig  . clonazePAM (KLONOPIN) 0.5 MG tablet Take 0.5 mg by mouth at bedtime.  . pantoprazole (PROTONIX) 40 MG tablet Take 1 tablet (40 mg total) by mouth 2 (two) times daily.  . traZODone (DESYREL) 50 MG tablet Take 0.5-1 tablets (25-50 mg total) by mouth at bedtime as needed for sleep.  . Vitamin D, Ergocalciferol, (DRISDOL) 50000 units CAPS capsule Take 1 capsule by mouth every 7 days. Take for 2 months. (Patient taking differently: Take 50,000 Units by mouth every 7 (seven) days. On Monday)  . zaleplon (SONATA) 10 MG capsule Take 1 capsule (10 mg total) by mouth at bedtime as needed for sleep.  Marland Kitchen zolpidem (AMBIEN CR) 12.5 MG CR tablet Take 12.5 mg by mouth at bedtime.  ROS:                                                                                                                                       Had some chest pain previously. Has chronic abdominal pain. Had a headache recently. No vision or speech changes currently. No trouble chewing or swallowing. Other ROS as per HPI.    Blood pressure (!) 139/97, pulse 61, temperature 98.5 F (36.9 C), temperature source Oral, resp. rate 16, height 5\' 9"  (1.753 m), weight 88.5 kg, SpO2 (!) 88 %.   General Examination:                                                                                                         HEENT: /AT Lungs: Respirations unlabored Ext: No edema  Neurologic Examination: Mental Status: Alert, fully oriented, thought content appropriate.  Speech fluent without evidence of aphasia.  Able to follow all commands without difficulty. Cranial Nerves: II:  Visual fields intact. PERRL  III,IV, VI: EOMI. No ptosis  V,VII: Temp sensation intact bilaterally. No facial droop.  VIII: Hearing intact to voice IX,X: Palate rises symmetrically XI: Symmetric shoulder shrug XII: No lingual dysarthria Motor: Right : Upper extremity   4/5 with ?giveway  Left:     Upper extremity   5/5  Lower extremity   5/5     Lower extremity   5/5 Normal tone throughout; no atrophy noted Right pronator drift and left parietal drift; unusual findings when seen together Sensory: Decreased fine touch sensation to RUE and RLE. Temp sensation subjectively normal x 4.   Deep Tendon Reflexes:  2+ upper extremities, patellar and achilles reflexes bilaterally.   Plantars: Right: downgoing   Left: downgoing Cerebellar: No ataxia with FNF bilaterally. No ataxia with H-S bilaterally.  Gait: Deferred    Lab Results: Basic Metabolic Panel: Recent Labs  Lab 10/05/17 1505 10/05/17 1520  NA 139 138  K 4.7 4.3  CL 104 105  CO2 25  --   GLUCOSE 92 93  BUN 16 19  CREATININE 1.07 1.10  CALCIUM 9.5  --     CBC: Recent Labs  Lab 10/05/17 1505 10/05/17 1520  WBC 11.6*  --   NEUTROABS 8.3*  --   HGB 14.0 14.3  HCT 43.1 42.0  MCV 94.5  --   PLT 359  --     Cardiac Enzymes: No results for input(s): CKTOTAL, CKMB, CKMBINDEX, TROPONINI in the last 168 hours.  Lipid Panel: No results for input(s): CHOL, TRIG, HDL, CHOLHDL, VLDL, LDLCALC in the last 168 hours.  Imaging: Ct Head Wo Contrast  Result Date: 10/05/2017 CLINICAL DATA:  47 y/o  M; 2 days of right-sided weakness. EXAM: CT HEAD WITHOUT CONTRAST TECHNIQUE: Contiguous axial images were obtained  from the base of the skull through the vertex without intravenous contrast. COMPARISON:  None. FINDINGS: Brain: No evidence of acute infarction, hemorrhage, hydrocephalus, extra-axial collection or mass lesion/mass effect. Vascular: No hyperdense vessel or unexpected calcification. Skull: Normal. Negative for fracture or focal lesion. Sinuses/Orbits: No acute finding. Other: None. IMPRESSION: No acute intracranial abnormality identified. Unremarkable CT of the head. Electronically Signed   By: Kristine Garbe M.D.   On: 10/05/2017 17:30    Assessment:  47 year old male with a chief complaint of "room-spinning" dizziness for 2 days in conjunction with right arm numbness, right arm weakness, right arm paresthesias, right leg paresthesias, headache and light sensitivity.  1. Neurological examination reveals no focal deficits.  2. On my preliminary review of the brain MRI, there is no acute infarction seen. However, official Radiology report describes an acute punctate infarct on the left at the mesencephalic-diencephalic junction. Based upon my review of the images, the signal discrepancy is as likely due to background noise as an infarction.  3. Normal MRA head 4. DDx for presentation includes small ischemic infarction, TIA and complicated migraine  Recommendations: 1. TTE 2. Carotid ultrasound 3. HgbA1c and fasting lipid panel 4. ASA 81 mg po qd 5. PT/OT/Speech 6. Cardiac telemetry 7. BP management   Electronically signed: Dr. Kerney Elbe 10/05/2017, 10:58 PM

## 2017-10-05 NOTE — ED Notes (Signed)
PA explained tests results and admission plan to pt. and family .

## 2017-10-05 NOTE — H&P (Signed)
History and Physical    [name] OZD:[id number] DOB: 1970/12/28 DOA: 10/05/2017  Referring MD/NP/PA:   PCP: [name], MD   Patient coming from:  The patient is coming from home.  At baseline, pt is independent for most of ADL.   Chief Complaint: right-sided weakness, dizziness, slurred speech  HPI: [name] is a 47 y.o. male with medical history significant of gastric ulcer disease, GI bleeding, clotting disorder, OSA not on CPAP, GERD, anxiety, who presents with right-sided weakness, dizziness, slurred speech.  Patient states that he has been having right-sided weakness in the past 2 days, arm is worsening leg. He also has dizziness, feelings room spinning around him. He states that he some hearing loss. Also has mild slurred speech. No facial droop or weight loss. Patient does not have chest pain, shortness breath, cough, fever or chills. He has nausea, no vomiting, diarrhea or abdominal pain. No symptoms of UTI.   ED Course: pt was found to have WBC 11.6, INR 0.92, PTT 30, negative troponin, and a urinalysis, creatinine 1.10, temperature 99.1, bradycardia, oxygen section 88-99% on room air. CT head is negative for acute intracranial abnormalities. MRA is normal MAR of brain showed stroke. Patient is placed on telemetry bed for observation. Neurology, Dr. Cheral Marker was consulted.  MRI of brain: 1. Probable punctate focus of reduced diffusion within the left thalamo-mesencephalic junction. Findings are compatible with a small vessel acute/early subacute infarction. No additional findings for differential considerations of infectious/inflammatory demyelination or vasculitis. 2. Normal MRA of the head.  Review of Systems:   General: no fevers, chills, no body weight gain, has fatigue HEENT: no blurry vision, hearing changes or sore throat Respiratory: no dyspnea, coughing, wheezing CV: no chest pain, no palpitations GI: has nausea, no vomiting, abdominal pain, diarrhea,  constipation GU: no dysuria, burning on urination, increased urinary frequency, hematuria  Ext: no leg edema Neuro: has right-sided weakness, dizziness, slurred speech and dizziness, hearing loss. Skin: no rash, no skin tear. MSK: No muscle spasm, no deformity, no limitation of range of movement in spin Heme: No easy bruising.  Travel history: No recent long distant travel.  Allergy:  Allergies  Allergen Reactions  . Dilaudid [Hydromorphone Hcl] Rash and Other (See Comments)    Shakey  . Metoclopramide Hcl Hives, Swelling, Rash and Other (See Comments)    Swelling in lips  . Nsaids     H/o bleeding gastric ulcers    Past Medical History:  Diagnosis Date  . Anxiety    takes Xanax daily prn  . Chronic gastric ulcer with bleeding s/p suture repair 2012 09/29/2010   Head 3 EGD which failed to stop the bleeding. Had exploratory laparotomy on 10/04/2010. Possible cause of the gastric ulcer was NSAID use.   . Clotting disorder ([place])   . Constipation    related to pain meds  . Dizziness    occasionally  . Erosive esophagitis   . External hemorrhoid, bleeding   . Gastric ulcer    history of  . GERD (gastroesophageal reflux disease)    takes Protonix bid  . Hiatal hernia   . History of blood clots 2012   in abdomen  . History of blood transfusion 2012  . Incisional hernia s/p open repair w mesh Aug 2013 05/26/2011  . Joint pain    knees  . Nocturia    depends on amount of fluid he drinks  . Pneumonia   . Pre-diabetes   . Primary localized osteoarthritis of  left hip 04/24/2014  . Serrated polyp of colon   . Sleep apnea    no cpap  mild no cpap needed  . Upper GI bleed   . Ventral hernia     Past Surgical History:  Procedure Laterality Date  . COLONOSCOPY    . ESOPHAGEAL MANOMETRY N/A 10/19/2016   Procedure: ESOPHAGEAL MANOMETRY (EM);  Surgeon: Mauri Pole, MD;  Location: [facility];  Service: Endoscopy;  Laterality: N/A;  . ESOPHAGOGASTRODUODENOSCOPY    .  ESOPHAGOGASTRODUODENOSCOPY N/A 01/22/2017   Procedure: ESOPHAGOGASTRODUODENOSCOPY (EGD);  Surgeon: [name], MD;  Location: [facility];  Service: General;  Laterality: N/A;  . INCISIONAL HERNIA REPAIR  09/04/2011   Procedure: HERNIA REPAIR INCISIONAL;  Surgeon: Madilyn Hook, DO;  Location: [place];  Service: General;  Laterality: N/A;  debridment calcified mass  . INSERTION OF MESH  09/04/2011   retrorectus ultrapro "30x30cm"  . INSERTION OF MESH N/A 01/22/2017   Procedure: INSERTION OF MESH;  Surgeon: [name], MD;  Location: [facility];  Service: General;  Laterality: N/A;  . NISSEN FUNDOPLICATION     Open [id number]  . POLYPECTOMY    . STOMACH SURGERY  10/05/2010   Oversewing of gastric ulcer.  Dr Brantley Stage  . TOTAL HIP ARTHROPLASTY Left 04/24/2014   Procedure: LEFT TOTAL HIP ARTHROPLASTY;  Surgeon: Marchia Bond, MD;  Location: [place];  Service: Orthopedics;  Laterality: Left;  . UPPER GASTROINTESTINAL ENDOSCOPY      Social History:  reports that he has never smoked. His smokeless tobacco use includes chew. He reports that he drinks alcohol. He reports that he does not use drugs.  Family History:  Family History  Problem Relation Age of Onset  . Diabetes Mother   . Liver disease Mother   . Diabetes Father   . Heart disease Father   . Colon cancer Neg Hx   . Stomach cancer Neg Hx   . Rectal cancer Neg Hx   . Esophageal cancer Neg Hx   . Colon polyps Neg Hx      Prior to Admission medications   Medication Sig Start Date End Date Taking? Authorizing Provider  clonazePAM (KLONOPIN) 0.5 MG tablet Take 0.5 mg by mouth at bedtime. 09/06/17  Yes [provider]  pantoprazole (PROTONIX) 40 MG tablet Take 1 tablet (40 mg total) by mouth 2 (two) times daily. 06/15/17  Yes [name], MD  traZODone (DESYREL) 50 MG tablet Take 0.5-1 tablets (25-50 mg total) by mouth at bedtime as needed for sleep. 08/25/17  Yes [name], MD  Vitamin D, Ergocalciferol, (DRISDOL) 50000 units CAPS  capsule Take 1 capsule by mouth every 7 days. Take for 2 months. Patient taking differently: Take 50,000 Units by mouth every 7 (seven) days. On Monday 10/01/17  Yes [name], MD  zaleplon (SONATA) 10 MG capsule Take 1 capsule (10 mg total) by mouth at bedtime as needed for sleep. 08/25/17  Yes [name], MD  zolpidem (AMBIEN CR) 12.5 MG CR tablet Take 12.5 mg by mouth at bedtime. 09/06/17  Yes [provider]  clonazePAM (KLONOPIN) 0.5 MG disintegrating tablet TAKE 1 TABLET BY MOUTH TWICE A DAY AS NEEDED FOR ANXIETY Patient not taking: Reported on 10/05/2017 08/10/17   [name], MD  fluticasone [facility]) 50 MCG/ACT nasal spray Place 2 sprays into both nostrils daily. Patient not taking: Reported on 10/05/2017 05/10/17   Sharion Balloon, FNP  HYDROcodone-acetaminophen (NORCO/VICODIN) 5-325 MG tablet Take 1-2 tablets by mouth every  6 (six) hours as needed for moderate pain or severe pain. Patient not taking: Reported on 10/05/2017 01/22/17   [name], MD  ondansetron [facility]) 8 MG tablet 1/2 to 1 full tablet as needed for nausea every 8 hours Patient not taking: Reported on 10/05/2017 06/15/17   [name], MD    Physical Exam: Vitals:   10/06/17 0200 10/06/17 0230 10/06/17 0300 10/06/17 0342  BP: (!) 141/93 (!) 141/101 135/76 137/84  Pulse: 63 62 63 (!) 59  Resp:  17 17 18   Temp:    98.2 F (36.8 C)  TempSrc:    Oral  SpO2: 95% 95% 96% 97%  Weight:    91.8 kg  Height:    5\' 9"  (1.753 m)   General: Not in acute distress HEENT:       Eyes: PERRL, EOMI, no scleral icterus.       ENT: No discharge from the ears and nose, no pharynx injection, no tonsillar enlargement.        Neck: No JVD, no bruit, no mass felt. Heme: No neck lymph node enlargement. Cardiac: S1/S2, RRR, No murmurs, No gallops or rubs. Respiratory: No rales, wheezing, rhonchi or rubs. GI: Soft, nondistended, nontender, no rebound pain, no organomegaly, BS present. GU: No hematuria Ext: No pitting leg  edema bilaterally. 2+DP/PT pulse bilaterally. Musculoskeletal: No joint deformities, No joint redness or warmth, no limitation of ROM in spin. Skin: No rashes.  Neuro: Alert, oriented X3, cranial nerves II-XII grossly intact, moves all extremities normally. Has horizotal nystagmus in both eyes. Muscle strength 4/5 right arm and leg. Sensation to light touch intact. Brachial reflex 2+ bilaterally. Negative Babinski's sign. Psych: Patient is not psychotic, no suicidal or hemocidal ideation.  Labs on Admission: I have personally reviewed following labs and imaging studies  CBC: Recent Labs  Lab 10/05/17 1505 10/05/17 1520  WBC 11.6*  --   NEUTROABS 8.3*  --   HGB 14.0 14.3  HCT 43.1 42.0  MCV 94.5  --   PLT 359  --    Basic Metabolic Panel: Recent Labs  Lab 10/05/17 1505 10/05/17 1520  NA 139 138  K 4.7 4.3  CL 104 105  CO2 25  --   GLUCOSE 92 93  BUN 16 19  CREATININE 1.07 1.10  CALCIUM 9.5  --    GFR: Estimated Creatinine Clearance: 92.9 mL/min (by C-G formula based on SCr of 1.1 mg/dL). Liver Function Tests: Recent Labs  Lab 10/05/17 1505  AST 32  ALT 24  ALKPHOS 50  BILITOT 0.8  PROT 6.4*  ALBUMIN 4.3   No results for input(s): LIPASE, AMYLASE in the last 168 hours. No results for input(s): AMMONIA in the last 168 hours. Coagulation Profile: Recent Labs  Lab 10/05/17 1505  INR 0.92   Cardiac Enzymes: No results for input(s): CKTOTAL, CKMB, CKMBINDEX, TROPONINI in the last 168 hours. BNP (last 3 results) No results for input(s): PROBNP in the last 8760 hours. HbA1C: Recent Labs    10/06/17 0525  HGBA1C 5.9*   CBG: No results for input(s): GLUCAP in the last 168 hours. Lipid Profile: Recent Labs    10/06/17 0525  CHOL 169  HDL 67  LDLCALC 76  TRIG 129  CHOLHDL 2.5   Thyroid Function Tests: No results for input(s): TSH, T4TOTAL, FREET4, T3FREE, THYROIDAB in the last 72 hours. Anemia Panel: No results for input(s): VITAMINB12, FOLATE,  FERRITIN, TIBC, IRON, RETICCTPCT in the last 72 hours. Urine analysis:    Component  Value Date/Time   COLORURINE AMBER (A) 09/07/2011 0625   APPEARANCEUR CLEAR 09/07/2011 0625   LABSPEC 1.022 09/07/2011 0625   PHURINE 6.0 09/07/2011 0625   GLUCOSEU NEGATIVE 09/07/2011 0625   HGBUR MODERATE (A) 09/07/2011 0625   BILIRUBINUR NEGATIVE 09/07/2011 0625   KETONESUR 15 (A) 09/07/2011 0625   PROTEINUR 100 (A) 09/07/2011 0625   UROBILINOGEN 0.2 09/07/2011 0625   NITRITE NEGATIVE 09/07/2011 0625   LEUKOCYTESUR NEGATIVE 09/07/2011 0625   Sepsis Labs: @LABRCNTIP (procalcitonin:4,lacticidven:4) )No results found for this or any previous visit (from the past 240 hour(s)).   Radiological Exams on Admission: Ct Head Wo Contrast  Result Date: 10/05/2017 CLINICAL DATA:  47 y/o  M; 2 days of right-sided weakness. EXAM: CT HEAD WITHOUT CONTRAST TECHNIQUE: Contiguous axial images were obtained from the base of the skull through the vertex without intravenous contrast. COMPARISON:  None. FINDINGS: Brain: No evidence of acute infarction, hemorrhage, hydrocephalus, extra-axial collection or mass lesion/mass effect. Vascular: No hyperdense vessel or unexpected calcification. Skull: Normal. Negative for fracture or focal lesion. Sinuses/Orbits: No acute finding. Other: None. IMPRESSION: No acute intracranial abnormality identified. Unremarkable CT of the head. Electronically Signed   By: Kristine Garbe M.D.   On: 10/05/2017 17:30   Mr [name] Head Wo Contrast  Result Date: 10/05/2017 CLINICAL DATA:  47 y/o M; right arm weakness, right arm tingling, and right leg tingling for 2 days. EXAM: MRI HEAD WITHOUT CONTRAST MRA HEAD WITHOUT CONTRAST TECHNIQUE: Multiplanar, multiecho pulse sequences of the brain and surrounding structures were obtained without intravenous contrast. Angiographic images of the head were obtained using MRA technique without contrast. COMPARISON:  10/05/2017 CT head FINDINGS: MRI HEAD  FINDINGS Brain: Probable punctate focus of reduced diffusion within the left thalamo-mesencephalic junction (series 3, image 24 and series 350, image 24). No additional area of reduced diffusion identified within the brain. No focal mass effect, hemorrhage, extra-axial collection, structural abnormality, or herniation. Vascular: As below. Skull and upper cervical spine: Normal marrow signal. Sinuses/Orbits: Negative. Other: None. MRA HEAD FINDINGS Internal carotid arteries:  Patent. Anterior cerebral arteries:  Patent. Middle cerebral arteries: Patent. Anterior communicating artery: Patent. Posterior communicating arteries: Not identified, likely hypoplastic or absent. Posterior cerebral arteries:  Patent. Basilar artery:  Patent. Vertebral arteries:  Patent. No evidence of high-grade stenosis, large vessel occlusion, or aneurysm. IMPRESSION: 1. Probable punctate focus of reduced diffusion within the left thalamo-mesencephalic junction. Findings are compatible with a small vessel acute/early subacute infarction. No additional findings for differential considerations of infectious/inflammatory demyelination or vasculitis. 2. Normal MRA of the head. These results were called by telephone at the time of interpretation on 10/05/2017 at 11:06 pm to [place], who verbally acknowledged these results. Electronically Signed   By: Kristine Garbe M.D.   On: 10/05/2017 23:12   Mr Brain Wo Contrast  Result Date: 10/05/2017 CLINICAL DATA:  47 y/o M; right arm weakness, right arm tingling, and right leg tingling for 2 days. EXAM: MRI HEAD WITHOUT CONTRAST MRA HEAD WITHOUT CONTRAST TECHNIQUE: Multiplanar, multiecho pulse sequences of the brain and surrounding structures were obtained without intravenous contrast. Angiographic images of the head were obtained using MRA technique without contrast. COMPARISON:  10/05/2017 CT head FINDINGS: MRI HEAD FINDINGS Brain: Probable punctate focus of reduced diffusion within the  left thalamo-mesencephalic junction (series 3, image 24 and series 350, image 24). No additional area of reduced diffusion identified within the brain. No focal mass effect, hemorrhage, extra-axial collection, structural abnormality, or herniation. Vascular: As below. Skull and upper cervical  spine: Normal marrow signal. Sinuses/Orbits: Negative. Other: None. MRA HEAD FINDINGS Internal carotid arteries:  Patent. Anterior cerebral arteries:  Patent. Middle cerebral arteries: Patent. Anterior communicating artery: Patent. Posterior communicating arteries: Not identified, likely hypoplastic or absent. Posterior cerebral arteries:  Patent. Basilar artery:  Patent. Vertebral arteries:  Patent. No evidence of high-grade stenosis, large vessel occlusion, or aneurysm. IMPRESSION: 1. Probable punctate focus of reduced diffusion within the left thalamo-mesencephalic junction. Findings are compatible with a small vessel acute/early subacute infarction. No additional findings for differential considerations of infectious/inflammatory demyelination or vasculitis. 2. Normal MRA of the head. These results were called by telephone at the time of interpretation on 10/05/2017 at 11:06 pm to [place], who verbally acknowledged these results. Electronically Signed   By: Kristine Garbe M.D.   On: 10/05/2017 23:12     EKG: Independently reviewed.  Sinus rhythm, QTC 425, low voltage, T-wave inversion in lead 3, nonspecific T-wave change.  Assessment/Plan Principal Problem:   Stroke [facility]) Active Problems:   History of bleeding gastric ulcer status post repair 2012 (NSAIDs)   Anxiety   Gastroesophageal reflux disease   Leukocytosis   Stroke [facility]): MRI showed stroke and MRA is normal. Dr. Cheral Marker of neuro was consulted.  -will place on tele bed for obs -highly appreciate dr. Yvetta Coder consultation, will follow up recommendations as follows: 1. TTE 2. Carotid ultrasound 3. HgbA1c and fasting lipid panel 4. ASA  81 mg po qd 5. PT/OT/Speech 6. Cardiac telemetry 7. BP management  History of bleeding gastric ulcer status post repair 2012 (NSAIDs) and GERD -watch any signs of GIB  -pt is on protonix 40 mg bid  Anxiety: -Clonidine  Leukocytosis: no fever. ikely due to stress induced to demargination -will follow up blood and urine culture, UA -IVF:  100 cc/h of NS -follow up by CBC    DVT ppx: SQ Lovenox Code Status: Full code Family Communication: Yes, patient's  brother  at bed side Disposition Plan:  Anticipate discharge back to previous home environment Consults called:  Dr. Cheral Marker of neuro Admission status: Obs / tele     Date of Service 10/06/2017    Ivor Costa [facility] Pager [phone]  If 7PM-7AM, please contact night-coverage [email or web address] Password [email or web address] 10/06/2017, 7:00 AM

## 2017-10-05 NOTE — ED Provider Notes (Signed)
[facility] EMERGENCY DEPARTMENT Provider Note   CSN: [id number] Arrival date & time: 10/05/17  1442     History   Chief Complaint Chief Complaint  Patient presents with  . Dizziness    HPI [name] is a 47 y.o. male.  HPI Patient does not remember exactly when his symptoms started.  He reports sometime around 2 days ago.  First noticed that his right arm was kind of weak and numb.  He then reports that there was a progression and his whole right side seemed to get numb and somewhat weak including the leg.  He cannot specify when that came but it was subsequent to the right arm.  He reports also now over the past day or so he has developed a quality of dizziness.  Reports it feels like things are spinning.  He also reports that he is got moderate, generalized aching headache.  He reports he never gets headaches.  He is not specifically identified any change in his vision, there is been no slurred speech.  No recent illness with fevers or chills or nausea or vomiting.  Patient does have history of bleeding gastric ulcers.  He has had to go surgical repair which was done last December. Past Medical History:  Diagnosis Date  . Anxiety    takes Xanax daily prn  . Chronic gastric ulcer with bleeding s/p suture repair 2012 09/29/2010   Head 3 EGD which failed to stop the bleeding. Had exploratory laparotomy on 10/04/2010. Possible cause of the gastric ulcer was NSAID use.   . Clotting disorder ([place])   . Constipation    related to pain meds  . Dizziness    occasionally  . Erosive esophagitis   . External hemorrhoid, bleeding   . Gastric ulcer    history of  . GERD (gastroesophageal reflux disease)    takes Protonix bid  . Hiatal hernia   . History of blood clots 2012   in abdomen  . History of blood transfusion 2012  . Incisional hernia s/p open repair w mesh Aug 2013 05/26/2011  . Joint pain    knees  . Nocturia    depends on amount of fluid he drinks  .  Pneumonia   . Pre-diabetes   . Primary localized osteoarthritis of left hip 04/24/2014  . Serrated polyp of colon   . Sleep apnea    no cpap  mild no cpap needed  . Upper GI bleed   . Ventral hernia     Patient Active Problem List   Diagnosis Date Noted  . Vitamin D deficiency 08/25/2017  . Hyperkalemia 08/25/2017  . Visit for well man health check 04/14/2017  . Fatigue 11/23/2016  . Gastroesophageal reflux disease   . Recurrent hiatal hernia s/p redo robotic PEH/Nissen repair 01/22/2017   . Elevated blood pressure reading 12/12/2015  . Difficulty concentrating 06/20/2015  . Insomnia 05/30/2014  . Primary localized osteoarthritis of left hip 04/24/2014  . Elevated hemoglobin A1c 11/17/2012  . Erectile dysfunction 08/08/2012  . History of bleeding gastric ulcer status post repair 2012 (NSAIDs) 05/26/2011  . Anxiety 05/26/2011  . DJD (degenerative joint disease) 10/14/2010    Past Surgical History:  Procedure Laterality Date  . COLONOSCOPY    . ESOPHAGEAL MANOMETRY N/A 10/19/2016   Procedure: ESOPHAGEAL MANOMETRY (EM);  Surgeon: Mauri Pole, MD;  Location: [facility];  Service: Endoscopy;  Laterality: N/A;  . ESOPHAGOGASTRODUODENOSCOPY    . ESOPHAGOGASTRODUODENOSCOPY N/A 01/22/2017   Procedure:  ESOPHAGOGASTRODUODENOSCOPY (EGD);  Surgeon: [name], MD;  Location: [facility];  Service: General;  Laterality: N/A;  . INCISIONAL HERNIA REPAIR  09/04/2011   Procedure: HERNIA REPAIR INCISIONAL;  Surgeon: Madilyn Hook, DO;  Location: [facility];  Service: General;  Laterality: N/A;  debridment calcified mass  . INSERTION OF MESH  09/04/2011   retrorectus ultrapro "30x30cm"  . INSERTION OF MESH N/A 01/22/2017   Procedure: INSERTION OF MESH;  Surgeon: [name], MD;  Location: [facility];  Service: General;  Laterality: N/A;  . NISSEN FUNDOPLICATION     Open 0272Z  . POLYPECTOMY    . STOMACH SURGERY  10/05/2010   Oversewing of gastric ulcer.  Dr Brantley Stage  . TOTAL HIP ARTHROPLASTY  Left 04/24/2014   Procedure: LEFT TOTAL HIP ARTHROPLASTY;  Surgeon: Marchia Bond, MD;  Location: [facility];  Service: Orthopedics;  Laterality: Left;  . UPPER GASTROINTESTINAL ENDOSCOPY          Home Medications    Prior to Admission medications   Medication Sig Start Date End Date Taking? Authorizing Provider  clonazePAM (KLONOPIN) 0.5 MG tablet Take 0.5 mg by mouth at bedtime. 09/06/17  Yes [provider]  pantoprazole (PROTONIX) 40 MG tablet Take 1 tablet (40 mg total) by mouth 2 (two) times daily. 06/15/17  Yes [name], MD  traZODone (DESYREL) 50 MG tablet Take 0.5-1 tablets (25-50 mg total) by mouth at bedtime as needed for sleep. 08/25/17  Yes [name], MD  Vitamin D, Ergocalciferol, (DRISDOL) 50000 units CAPS capsule Take 1 capsule by mouth every 7 days. Take for 2 months. Patient taking differently: Take 50,000 Units by mouth every 7 (seven) days. On Monday 10/01/17  Yes [name], MD  zaleplon (SONATA) 10 MG capsule Take 1 capsule (10 mg total) by mouth at bedtime as needed for sleep. 08/25/17  Yes [name], MD  zolpidem (AMBIEN CR) 12.5 MG CR tablet Take 12.5 mg by mouth at bedtime. 09/06/17  Yes [provider]  clonazePAM (KLONOPIN) 0.5 MG disintegrating tablet TAKE 1 TABLET BY MOUTH TWICE A DAY AS NEEDED FOR ANXIETY Patient not taking: Reported on 10/05/2017 08/10/17   [name], MD  fluticasone [facility]) 50 MCG/ACT nasal spray Place 2 sprays into both nostrils daily. Patient not taking: Reported on 10/05/2017 05/10/17   Sharion Balloon, FNP  HYDROcodone-acetaminophen (NORCO/VICODIN) 5-325 MG tablet Take 1-2 tablets by mouth every 6 (six) hours as needed for moderate pain or severe pain. Patient not taking: Reported on 10/05/2017 01/22/17   [name], MD  ondansetron [facility]) 8 MG tablet 1/2 to 1 full tablet as needed for nausea every 8 hours Patient not taking: Reported on 10/05/2017 06/15/17   [name], MD    Family History Family History    Problem Relation Age of Onset  . Diabetes Mother   . Liver disease Mother   . Diabetes Father   . Heart disease Father   . Colon cancer Neg Hx   . Stomach cancer Neg Hx   . Rectal cancer Neg Hx   . Esophageal cancer Neg Hx   . Colon polyps Neg Hx     Social History Social History   Tobacco Use  . Smoking status: Never Smoker  . Smokeless tobacco: Current User    Types: Chew  . Tobacco comment: occ-Chew  Substance Use Topics  . Alcohol use: Yes    Alcohol/week: 0.0 standard drinks    Comment: 09/04/11 "very seldom"  . Drug  use: No     Allergies   Dilaudid [hydromorphone hcl]; Metoclopramide hcl; and Nsaids   Review of Systems Review of Systems 10 Systems reviewed and are negative for acute change except as noted in the HPI.   Physical Exam Updated Vital Signs BP (!) 134/93   Pulse 65   Temp 98.5 F (36.9 C) (Oral)   Resp 18   Ht 5\' 9"  (1.753 m)   Wt 88.5 kg   SpO2 96%   BMI 28.80 kg/m   Physical Exam  Constitutional: He is oriented to person, place, and time. He appears well-developed and well-nourished. No distress.  She is alert and nontoxic.  Mental status clear.  Clinically well in appearance.  HENT:  Head: Normocephalic and atraumatic.  Right Ear: External ear normal.  Left Ear: External ear normal.  Nose: Nose normal.  Mouth/Throat: Oropharynx is clear and moist.  Eyes: Pupils are equal, round, and reactive to light. EOM are normal.  Neck: Neck supple.  Cardiovascular: Normal rate, regular rhythm, normal heart sounds and intact distal pulses.  Pulmonary/Chest: Effort normal and breath sounds normal.  Abdominal: Soft. He exhibits no distension and no mass. There is no tenderness. There is no guarding.  Musculoskeletal: Normal range of motion. He exhibits no edema or tenderness.  Neurological: He is alert and oriented to person, place, and time. No cranial nerve deficit. Coordination normal.  Finger-nose exam bilaterally.  No visual field deficit.   Right upper extremity grip strength 3\5, left upper extremity grip strength 5\5.  Right lower extremity, patient has difficulty holding against resistance.  Left lower extremity no difficulty holding against resistance.  Subjective decreased sensation to light touch right upper extremity.  Skin: Skin is warm and dry.  Psychiatric: He has a normal mood and affect.     ED Treatments / Results  Labs (all labs ordered are listed, but only abnormal results are displayed) Labs Reviewed  CBC - Abnormal; Notable for the following components:      Result Value   WBC 11.6 (*)    All other components within normal limits  DIFFERENTIAL - Abnormal; Notable for the following components:   Neutro Abs 8.3 (*)    All other components within normal limits  COMPREHENSIVE METABOLIC PANEL - Abnormal; Notable for the following components:   Total Protein 6.4 (*)    All other components within normal limits  PROTIME-INR  APTT  I-STAT TROPONIN, ED  I-STAT CHEM 8, ED  CBG MONITORING, ED    EKG None  Radiology Ct Head Wo Contrast  Result Date: 10/05/2017 CLINICAL DATA:  47 y/o  M; 2 days of right-sided weakness. EXAM: CT HEAD WITHOUT CONTRAST TECHNIQUE: Contiguous axial images were obtained from the base of the skull through the vertex without intravenous contrast. COMPARISON:  None. FINDINGS: Brain: No evidence of acute infarction, hemorrhage, hydrocephalus, extra-axial collection or mass lesion/mass effect. Vascular: No hyperdense vessel or unexpected calcification. Skull: Normal. Negative for fracture or focal lesion. Sinuses/Orbits: No acute finding. Other: None. IMPRESSION: No acute intracranial abnormality identified. Unremarkable CT of the head. Electronically Signed   By: Kristine Garbe M.D.   On: 10/05/2017 17:30    Procedures Procedures (including critical care time)  Medications Ordered in ED Medications - No data to display   Initial Impression / Assessment and Plan / ED Course    I have reviewed the triage vital signs and the nursing notes.  Pertinent labs & imaging results that were available during my care of the patient  were reviewed by me and considered in my medical decision making (see chart for details).  Clinical Course as of Oct 05 2217  Tue Oct 05, 2017  2112 Neurology consult order placed again.   [MP]  2128 Consult: Dr. Cheral Marker will see the patient in the emergency department and review MRI results.   [MP]    Clinical Course User Index [MP] Charlesetta Shanks, MD   PA-C [facility] will follow up with Dr. Cheral Marker regarding definitive management after return of MRI.  Final Clinical Impressions(s) / ED Diagnoses   Final diagnoses:  Right sided weakness  Vertigo    ED Discharge Orders    None       Charlesetta Shanks, MD 10/18/17 1257

## 2017-10-06 ENCOUNTER — Encounter (HOSPITAL_COMMUNITY): Payer: Self-pay | Admitting: Nurse Practitioner

## 2017-10-06 ENCOUNTER — Observation Stay (HOSPITAL_BASED_OUTPATIENT_CLINIC_OR_DEPARTMENT_OTHER): Payer: No Typology Code available for payment source

## 2017-10-06 ENCOUNTER — Observation Stay (HOSPITAL_COMMUNITY): Payer: No Typology Code available for payment source

## 2017-10-06 DIAGNOSIS — I6300 Cerebral infarction due to thrombosis of unspecified precerebral artery: Secondary | ICD-10-CM

## 2017-10-06 DIAGNOSIS — R42 Dizziness and giddiness: Secondary | ICD-10-CM | POA: Insufficient documentation

## 2017-10-06 DIAGNOSIS — R531 Weakness: Secondary | ICD-10-CM | POA: Diagnosis not present

## 2017-10-06 DIAGNOSIS — K219 Gastro-esophageal reflux disease without esophagitis: Secondary | ICD-10-CM | POA: Diagnosis not present

## 2017-10-06 DIAGNOSIS — I639 Cerebral infarction, unspecified: Secondary | ICD-10-CM | POA: Diagnosis not present

## 2017-10-06 DIAGNOSIS — I5030 Unspecified diastolic (congestive) heart failure: Secondary | ICD-10-CM | POA: Diagnosis not present

## 2017-10-06 DIAGNOSIS — D72829 Elevated white blood cell count, unspecified: Secondary | ICD-10-CM | POA: Diagnosis not present

## 2017-10-06 LAB — LIPID PANEL
CHOL/HDL RATIO: 2.5 ratio
Cholesterol: 169 mg/dL (ref 0–200)
HDL: 67 mg/dL (ref >40)
LDL CALC: 76 mg/dL (ref 0–99)
Triglycerides: 129 mg/dL (ref <150)
VLDL: 26 mg/dL (ref 0–40)

## 2017-10-06 LAB — URINALYSIS, ROUTINE W REFLEX MICROSCOPIC
BILIRUBIN URINE: NEGATIVE
GLUCOSE, UA: NEGATIVE mg/dL
HGB URINE DIPSTICK: NEGATIVE
Ketones, ur: NEGATIVE mg/dL
Leukocytes, UA: NEGATIVE
NITRITE: NEGATIVE
PH: 6 (ref 5.0–8.0)
Protein, ur: NEGATIVE mg/dL
Specific Gravity, Urine: 1.016 (ref 1.005–1.030)

## 2017-10-06 LAB — HIV ANTIBODY (ROUTINE TESTING W REFLEX): HIV Screen 4th Generation wRfx: NONREACTIVE

## 2017-10-06 LAB — ECHOCARDIOGRAM COMPLETE
HEIGHTINCHES: 69 in
WEIGHTICAEL: 3238.12 [oz_av]

## 2017-10-06 LAB — HEMOGLOBIN A1C
Hgb A1c MFr Bld: 5.9 % — ABNORMAL HIGH (ref 4.8–5.6)
MEAN PLASMA GLUCOSE: 122.63 mg/dL

## 2017-10-06 MED ORDER — ADULT MULTIVITAMIN W/MINERALS CH
1.0000 | ORAL_TABLET | Freq: Every day | ORAL | Status: DC
  Administered 2017-10-06 – 2017-10-07 (×2): 1 via ORAL
  Filled 2017-10-06 (×2): qty 1

## 2017-10-06 MED ORDER — ONDANSETRON HCL 4 MG/2ML IJ SOLN: 4.0000 mg | Freq: Three times a day (TID) | INTRAMUSCULAR | Status: DC | PRN

## 2017-10-06 MED ORDER — ENOXAPARIN SODIUM 40 MG/0.4ML ~~LOC~~ SOLN
40.0000 mg | SUBCUTANEOUS | Status: DC
  Administered 2017-10-06 – 2017-10-07 (×2): 40 mg via SUBCUTANEOUS
  Filled 2017-10-06 (×2): qty 0.4

## 2017-10-06 MED ORDER — ATORVASTATIN CALCIUM 10 MG PO TABS
10.0000 mg | ORAL_TABLET | Freq: Every day | ORAL | Status: DC
  Administered 2017-10-06: 10 mg via ORAL
  Filled 2017-10-06: qty 1

## 2017-10-06 MED ORDER — ENSURE ENLIVE PO LIQD
237.0000 mL | Freq: Two times a day (BID) | ORAL | Status: DC
  Administered 2017-10-06 (×2): 237 mL via ORAL

## 2017-10-06 MED ORDER — PANTOPRAZOLE SODIUM 40 MG PO TBEC
40.0000 mg | DELAYED_RELEASE_TABLET | Freq: Two times a day (BID) | ORAL | Status: DC
  Administered 2017-10-06 – 2017-10-07 (×4): 40 mg via ORAL
  Filled 2017-10-06 (×4): qty 1

## 2017-10-06 MED ORDER — PERFLUTREN LIPID MICROSPHERE
1.0000 mL | INTRAVENOUS | Status: AC | PRN
  Administered 2017-10-06: 2 mL via INTRAVENOUS
  Filled 2017-10-06: qty 10

## 2017-10-06 MED ORDER — TRAZODONE HCL 50 MG PO TABS: 25.0000 mg | ORAL_TABLET | Freq: Every evening | ORAL | Status: DC | PRN

## 2017-10-06 MED ORDER — ASPIRIN EC 81 MG PO TBEC
81.0000 mg | DELAYED_RELEASE_TABLET | Freq: Every day | ORAL | Status: DC
  Administered 2017-10-06 – 2017-10-07 (×2): 81 mg via ORAL
  Filled 2017-10-06 (×2): qty 1

## 2017-10-06 MED ORDER — CLONAZEPAM 0.5 MG PO TABS
0.5000 mg | ORAL_TABLET | Freq: Every day | ORAL | Status: DC
  Administered 2017-10-06: 0.5 mg via ORAL
  Filled 2017-10-06: qty 1

## 2017-10-06 MED ORDER — ACETAMINOPHEN 160 MG/5ML PO SOLN: 650.0000 mg | ORAL | Status: DC | PRN

## 2017-10-06 MED ORDER — STROKE: EARLY STAGES OF RECOVERY BOOK
Freq: Once | Status: AC
  Administered 2017-10-06: 05:00:00
  Filled 2017-10-06: qty 1

## 2017-10-06 MED ORDER — ACETAMINOPHEN 650 MG RE SUPP: 650.0000 mg | RECTAL | Status: DC | PRN

## 2017-10-06 MED ORDER — SODIUM CHLORIDE 0.9 % IV SOLN
INTRAVENOUS | Status: DC
  Administered 2017-10-06: 04:00:00 via INTRAVENOUS

## 2017-10-06 MED ORDER — ACETAMINOPHEN 325 MG PO TABS
650.0000 mg | ORAL_TABLET | ORAL | Status: DC | PRN
  Administered 2017-10-06 (×2): 650 mg via ORAL
  Filled 2017-10-06 (×2): qty 2

## 2017-10-06 MED ORDER — ZOLPIDEM TARTRATE 5 MG PO TABS
5.0000 mg | ORAL_TABLET | Freq: Every evening | ORAL | Status: DC | PRN
  Administered 2017-10-06 (×2): 5 mg via ORAL
  Filled 2017-10-06 (×2): qty 1

## 2017-10-06 MED ORDER — SENNOSIDES-DOCUSATE SODIUM 8.6-50 MG PO TABS: 1.0000 | ORAL_TABLET | Freq: Every evening | ORAL | Status: DC | PRN

## 2017-10-06 MED ORDER — CLOPIDOGREL BISULFATE 75 MG PO TABS
75.0000 mg | ORAL_TABLET | Freq: Every day | ORAL | Status: DC
  Administered 2017-10-06 – 2017-10-07 (×2): 75 mg via ORAL
  Filled 2017-10-06 (×2): qty 1

## 2017-10-06 MED ORDER — MECLIZINE HCL 25 MG PO TABS
12.5000 mg | ORAL_TABLET | Freq: Three times a day (TID) | ORAL | Status: DC | PRN
  Filled 2017-10-06: qty 0.5

## 2017-10-06 NOTE — Evaluation (Signed)
Physical Therapy Evaluation Patient Details Name: [name] MRN: [id number] DOB: 1970/02/05 Today's Date: 10/06/2017   History of Present Illness  [name] is a 47 y.o. male with medical history significant of gastric ulcer disease, GI bleeding, clotting disorder, OSA not on CPAP, GERD, anxiety, who presented with right-sided weakness, dizziness, slurred speech, vertigo. CT, MRI negative for acute abnormality. MRA did reveal "Probable punctate focus of reduced diffusion within the left thalamo-mesencephalic junction. 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  Feel he will benefit from skilled PT in the acute setting for higher level balance training and from outpatient PT for continued progression for return to work.  Currently S for ambulation and stairs with a railing.  At baseline works in [employer], occupational and working with tools.      Follow Up Recommendations Outpatient PT(neurorehabilitation)    Equipment Recommendations  None recommended by PT    Recommendations for Other Services       Precautions / Restrictions Precautions Precautions: None      Mobility  Bed Mobility Overal bed mobility: Modified Independent                Transfers Overall transfer level: Modified independent                  Ambulation/Gait Ambulation/Gait assistance: Supervision;Min guard Gait Distance (Feet): 200 Feet Assistive device: None Gait Pattern/deviations: Step-through pattern;Wide base of support     General Gait Details: slower pace, increased lateral sway  Stairs Stairs: Yes Stairs assistance: Supervision Stair Management: One rail Left;Step to pattern;Alternating pattern Number of Stairs: 3(x 3 trials) General stair comments:  mis-step when stepping down with R not getting down to next step all the way 2 of 3 trials, not sure if vision related or R LE weakness  Wheelchair Mobility    Modified Rankin (Stroke Patients Only) Modified Rankin (Stroke Patients Only) Pre-Morbid Rankin Score: No symptoms Modified Rankin: Moderate disability     Balance                                 Standardized Balance Assessment Standardized Balance Assessment : Berg Balance Test;Dynamic Gait Index Berg Balance Test Sit to Stand: Able to stand without using hands and stabilize independently Standing Unsupported: Able to stand safely 2 minutes Sitting with Back Unsupported but Feet Supported on Floor or Stool: Able to sit safely and securely 2 minutes Stand to Sit: Sits safely with minimal use of hands Transfers: Able to transfer safely, minor use of hands Standing Unsupported with Eyes Closed: Able to stand 10 seconds safely Standing Ubsupported with Feet Together: Able to place feet together independently and stand 1 minute safely From Standing, Reach Forward with Outstretched Arm: Can reach confidently >25 cm (10") From Standing Position, Pick up Object from Floor: Able to pick up shoe safely and easily From Standing Position, Turn to Look Behind Over each Shoulder: Looks behind from both sides and weight shifts well Turn 360 Degrees: Able to turn 360 degrees safely but slowly Standing Unsupported, Alternately Place Feet on Step/Stool: Able to stand independently and safely and complete 8 steps in 20 seconds Standing Unsupported, One Foot in Front: Able to place foot tandem independently and hold 30 seconds Standing  on One Leg: Able to lift leg independently and hold 5-10 seconds Total Score: 53 Dynamic Gait Index Level Surface: Mild Impairment Change in Gait Speed: Normal Gait with Horizontal Head Turns: Normal Gait with Vertical Head Turns: Mild Impairment Gait and Pivot Turn: Normal Step Over Obstacle:  Normal Step Around Obstacles: Normal Steps: Mild Impairment Total Score: 21       Pertinent Vitals/Pain Pain Assessment: Faces Faces Pain Scale: Hurts a little bit Pain Location: RUE Pain Descriptors / Indicators: Heaviness;Tingling;Dull Pain Intervention(s): Monitored during session    Home Living Family/patient expects to be discharged to:: Private residence Living Arrangements: Other relatives(brother and nephew) Available Help at Discharge: Family Type of Home: House Home Access: Stairs to enter Entrance Stairs-Rails: None Entrance Stairs-Number of Steps: 1 Home Layout: Two level;Able to live on main level with bedroom/bathroom Home Equipment: Bedside commode;Walker - 2 wheels      Prior Function Level of Independence: Independent         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[employer]; drives     Hand Dominance   Dominant Hand: Left    Extremity/Trunk Assessment   Upper Extremity Assessment Upper Extremity Assessment: Defer to OT evaluation    Lower Extremity Assessment Lower Extremity Assessment: RLE deficits/detail RLE Deficits / Details: AROM WFL, strength MMT WFL, but weaker compared to L, reports currently sensation intact, but was numb earlier       Communication   Communication: No difficulties  Cognition Arousal/Alertness: Awake/alert Behavior During Therapy: WFL for tasks assessed/performed Overall Cognitive Status: Within Functional Limits for tasks assessed                                        General Comments      Exercises     Assessment/Plan    PT Assessment Patient needs continued PT services  PT Problem List Decreased mobility;Decreased balance;Impaired sensation       PT Treatment Interventions DME instruction;Therapeutic activities;Therapeutic exercise;Gait training;Patient/family education;Balance training;Stair training;Functional mobility training    PT Goals (Current goals can be found in the Care  Plan section)  Acute Rehab PT Goals Patient Stated Goal: return to work PT Goal Formulation: With patient Time For Goal Achievement: 10/09/17 Potential to Achieve Goals: Good    Frequency Min 4X/week   Barriers to discharge        Co-evaluation               AM-PAC PT "6 Clicks" Daily Activity  Outcome Measure Difficulty turning over in bed (including adjusting bedclothes, sheets and blankets)?: None Difficulty moving from lying on back to sitting on the side of the bed? : None Difficulty sitting down on and standing up from a chair with arms (e.g., wheelchair, bedside commode, etc,.)?: A Little Help needed moving to and from a bed to chair (including a wheelchair)?: A Little Help needed walking in hospital room?: A Little Help needed climbing 3-5 steps with a railing? : A Little 6 Click Score: 20    End of Session Equipment Utilized During Treatment: Gait belt Activity Tolerance: Patient tolerated treatment well Patient left: in bed;with call bell/phone within reach;with family/visitor present   PT Visit Diagnosis: Other abnormalities of gait and mobility (R26.89)    Time: 0962-8366 PT Time Calculation (min) (ACUTE ONLY): 19 min   Charges:   PT Evaluation $PT Eval Low Complexity: 1 Low  [address], [place] 10/06/2017   Reginia Naas 10/06/2017, 5:02 PM

## 2017-10-06 NOTE — Progress Notes (Signed)
Carotid artery duplex has been completed. 1-39% ICA stenosis bilaterally.  10/06/17 2:13 PM Carlos Levering RVT

## 2017-10-06 NOTE — Progress Notes (Signed)
  Echocardiogram 2D Echocardiogram has been performed.  [name] G [name] 10/06/2017, 11:06 AM

## 2017-10-06 NOTE — Progress Notes (Signed)
SLP Cancellation Note  Patient Details Name: [name] MRN: [id number] DOB: [birth year]   Cancelled treatment:       Reason Eval/Treat Not Completed: Patient at procedure or test/unavailable.  Have attempted to see pt twice today.  This morning pt was undergoing echo and is now off the floor for procedure this afternoon.  Will continue efforts to see pt for cognitive-linguistic evaluation as he is available.     Keniesha Adderly, Selinda Orion 10/06/2017, 2:04 PM

## 2017-10-06 NOTE — Progress Notes (Signed)
Initial Nutrition Assessment  DOCUMENTATION CODES:   Not applicable  INTERVENTION:   - Ensure Enlive po BID, each supplement provides 350 kcal and 20 grams of protein (strawberry flavor)  - MVI with minerals daily  - Downgraded diet to Dysphagia 3 per pt request  NUTRITION DIAGNOSIS:   Unintentional weight loss related to altered GI function, early satiety as evidenced by per patient/family report, percent weight loss.  GOAL:   Patient will meet greater than or equal to 90% of their needs  MONITOR:   PO intake, Supplement acceptance, Labs, Weight trends  REASON FOR ASSESSMENT:   Malnutrition Screening Tool    ASSESSMENT:   47 year old male who presented to the ED with stroke-like symptoms. PMH significant for GERD, GI bleed, OSA not on CPAP, and anxiety. MRI showed stroke. However, neurology suspects abnormality related to background noise.  RN in pt's room at initial visit. Pt about to receive ECHO so RD returned later.  Spoke with pt at bedside who reports eating adequate prior to his surgery in December. Pt states that his surgery was in December 2018. Pt believes that his surgery was "like a lap band" and decreased the volume of food he could ingest at one time. Pt also describes food "getting stuck" in his throat. Pt states he has to slow down and cut food up to tolerate it.  Pt reports eating 3-4 small meals daily. A meal may include a hamburger. Pt states he does not eat much bread as it "gets stuck." Pt drinks water throughout the day but was drinking oral nutrition supplements daily in the first month after his surgery.  Pt reports his UBW as 225-230 lbs and that he last weighed this in December 2018. Per weight history in chart, pt has lost approximately 20 lbs since December  2018. This is an 8.8% weight loss in 9 months which is not significant for timeframe.  Pt reports he is very active with his job and often walks 3-4 miles within the course of a  workday.  Pt agreeable to receive Ensure Enlive during current admission.  Medications reviewed and include: Ensure Enlive BID, 40 mg Protonix BID  Labs reviewed: hemoglobin A1C 5.9 Lipid profile WNL  NUTRITION - FOCUSED PHYSICAL EXAM:    Most Recent Value  Orbital Region  Mild depletion  Upper Arm Region  Mild depletion  Thoracic and Lumbar Region  No depletion  Buccal Region  Mild depletion  Temple Region  No depletion  Clavicle Bone Region  Mild depletion  Clavicle and Acromion Bone Region  Mild depletion  Scapular Bone Region  No depletion  Dorsal Hand  No depletion  Patellar Region  No depletion  Anterior Thigh Region  No depletion  Posterior Calf Region  No depletion  Edema (RD Assessment)  None  Hair  Reviewed  Eyes  Reviewed  Mouth  Reviewed  Skin  Reviewed  Nails  Reviewed       Diet Order:   Diet Order            DIET DYS 3 Room service appropriate? Yes; Fluid consistency: Thin  Diet effective now              EDUCATION NEEDS:   No education needs have been identified at this time  Skin:  Skin Assessment: Reviewed RN Assessment  Last BM:  unknown/PTA  Height:   Ht Readings from Last 1 Encounters:  10/06/17 5\' 9"  (1.753 m)    Weight:   Wt Readings from  Last 1 Encounters:  10/06/17 91.8 kg    Ideal Body Weight:  72.73 kg  BMI:  Body mass index is 29.89 kg/m.  Estimated Nutritional Needs:   Kcal:  0947-0962  Protein:  115-130 grams  Fluid:  >/= 2.3 L    Gaynell Face, MS, RD, LDN Pager: [phone] Weekend/After Hours: [phone]

## 2017-10-06 NOTE — Progress Notes (Addendum)
STROKE TEAM PROGRESS NOTE   HPI ( Dr Cheral Marker )[name] is an 47 y.o. male who presented to the ED today with a chief complaint of "room-spinning" dizziness for 2 days with right arm numbness, right arm weakness, right arm paresthesias, right leg paresthesias, headache and light sensitivity. The vertigo is present both at rest and with movement. Triage RN noted RUE drift, but no facial droop, speech changes or visual field defect. An MRI brain was ordered, which on preliminary review of the images reveals no acute infarction. Preliminary review of MRA head reveals no occlusion or hemodynamically significant stenosis.  Official Radiology report resulted, with acute punctate infarct described. Based upon Dr Yvetta Coder review of the images, the signal discrepancy is as likely due to background noise as an infarction INTERVAL HISTORY His nephew is at the bedside.  He recounted being dizzy, like the room was off balance and numbness. He had similar light headed symptoms and dizziness last weekend as well.  He denies any prior history of strokes or TIAs  Vitals:   10/06/17 0230 10/06/17 0300 10/06/17 0342 10/06/17 0750  BP: (!) 141/101 135/76 137/84 125/76  Pulse: 62 63 (!) 59 69  Resp: 17 17 18 18   Temp:   98.2 F (36.8 C) 98.2 F (36.8 C)  TempSrc:   Oral Oral  SpO2: 95% 96% 97% 97%  Weight:   91.8 kg   Height:   5\' 9"  (1.753 m)     CBC:  Recent Labs  Lab 10/05/17 1505 10/05/17 1520  WBC 11.6*  --   NEUTROABS 8.3*  --   HGB 14.0 14.3  HCT 43.1 42.0  MCV 94.5  --   PLT 359  --     Basic Metabolic Panel:  Recent Labs  Lab 10/05/17 1505 10/05/17 1520  NA 139 138  K 4.7 4.3  CL 104 105  CO2 25  --   GLUCOSE 92 93  BUN 16 19  CREATININE 1.07 1.10  CALCIUM 9.5  --    Lipid Panel:     Component Value Date/Time   CHOL 169 10/06/2017 0525   TRIG 129 10/06/2017 0525   HDL 67 10/06/2017 0525   CHOLHDL 2.5 10/06/2017 0525   VLDL 26 10/06/2017 0525   LDLCALC 76 10/06/2017  0525   HgbA1c:  Lab Results  Component Value Date   HGBA1C 5.9 (H) 10/06/2017   Urine Drug Screen: No results found for: LABOPIA, COCAINSCRNUR, LABBENZ, AMPHETMU, THCU, LABBARB  Alcohol Level No results found for: ETH  IMAGING Ct Head Wo Contrast  Result Date: 10/05/2017 CLINICAL DATA:  47 y/o  M; 2 days of right-sided weakness. EXAM: CT HEAD WITHOUT CONTRAST TECHNIQUE: Contiguous axial images were obtained from the base of the skull through the vertex without intravenous contrast. COMPARISON:  None. FINDINGS: Brain: No evidence of acute infarction, hemorrhage, hydrocephalus, extra-axial collection or mass lesion/mass effect. Vascular: No hyperdense vessel or unexpected calcification. Skull: Normal. Negative for fracture or focal lesion. Sinuses/Orbits: No acute finding. Other: None. IMPRESSION: No acute intracranial abnormality identified. Unremarkable CT of the head. Electronically Signed   By: Kristine Garbe M.D.   On: 10/05/2017 17:30   Mr [name] Head Wo Contrast  Result Date: 10/05/2017 CLINICAL DATA:  47 y/o M; right arm weakness, right arm tingling, and right leg tingling for 2 days. EXAM: MRI HEAD WITHOUT CONTRAST MRA HEAD WITHOUT CONTRAST TECHNIQUE: Multiplanar, multiecho pulse sequences of the brain and surrounding structures were obtained without intravenous contrast. Angiographic images of  the head were obtained using MRA technique without contrast. COMPARISON:  10/05/2017 CT head FINDINGS: MRI HEAD FINDINGS Brain: Probable punctate focus of reduced diffusion within the left thalamo-mesencephalic junction (series 3, image 24 and series 350, image 24). No additional area of reduced diffusion identified within the brain. No focal mass effect, hemorrhage, extra-axial collection, structural abnormality, or herniation. Vascular: As below. Skull and upper cervical spine: Normal marrow signal. Sinuses/Orbits: Negative. Other: None. MRA HEAD FINDINGS Internal carotid arteries:  Patent.  Anterior cerebral arteries:  Patent. Middle cerebral arteries: Patent. Anterior communicating artery: Patent. Posterior communicating arteries: Not identified, likely hypoplastic or absent. Posterior cerebral arteries:  Patent. Basilar artery:  Patent. Vertebral arteries:  Patent. No evidence of high-grade stenosis, large vessel occlusion, or aneurysm. IMPRESSION: 1. Probable punctate focus of reduced diffusion within the left thalamo-mesencephalic junction. Findings are compatible with a small vessel acute/early subacute infarction. No additional findings for differential considerations of infectious/inflammatory demyelination or vasculitis. 2. Normal MRA of the head. These results were called by telephone at the time of interpretation on 10/05/2017 at 11:06 pm to [place], who verbally acknowledged these results. Electronically Signed   By: Kristine Garbe M.D.   On: 10/05/2017 23:12   Mr Brain Wo Contrast  Result Date: 10/05/2017 CLINICAL DATA:  47 y/o M; right arm weakness, right arm tingling, and right leg tingling for 2 days. EXAM: MRI HEAD WITHOUT CONTRAST MRA HEAD WITHOUT CONTRAST TECHNIQUE: Multiplanar, multiecho pulse sequences of the brain and surrounding structures were obtained without intravenous contrast. Angiographic images of the head were obtained using MRA technique without contrast. COMPARISON:  10/05/2017 CT head FINDINGS: MRI HEAD FINDINGS Brain: Probable punctate focus of reduced diffusion within the left thalamo-mesencephalic junction (series 3, image 24 and series 350, image 24). No additional area of reduced diffusion identified within the brain. No focal mass effect, hemorrhage, extra-axial collection, structural abnormality, or herniation. Vascular: As below. Skull and upper cervical spine: Normal marrow signal. Sinuses/Orbits: Negative. Other: None. MRA HEAD FINDINGS Internal carotid arteries:  Patent. Anterior cerebral arteries:  Patent. Middle cerebral arteries: Patent.  Anterior communicating artery: Patent. Posterior communicating arteries: Not identified, likely hypoplastic or absent. Posterior cerebral arteries:  Patent. Basilar artery:  Patent. Vertebral arteries:  Patent. No evidence of high-grade stenosis, large vessel occlusion, or aneurysm. IMPRESSION: 1. Probable punctate focus of reduced diffusion within the left thalamo-mesencephalic junction. Findings are compatible with a small vessel acute/early subacute infarction. No additional findings for differential considerations of infectious/inflammatory demyelination or vasculitis. 2. Normal MRA of the head. These results were called by telephone at the time of interpretation on 10/05/2017 at 11:06 pm to [place], who verbally acknowledged these results. Electronically Signed   By: Kristine Garbe M.D.   On: 10/05/2017 23:12    PHYSICAL EXAM Pleasant middle-age male currently not in distress. . Afebrile. Head is nontraumatic. Neck is supple without bruit.    Cardiac exam no murmur or gallop. Lungs are clear to auscultation. Distal pulses are well felt.  Neurological Exam ;  Awake  Alert oriented x 3. Normal speech and language.eye movements full without nystagmus.fundi were not visualized. Vision acuity and fields appear normal. Hearing is normal. Palatal movements are normal. Face symmetric. Tongue midline. Normal strength, tone, reflexes and coordination. Normal sensation. Gait deferred.  ASSESSMENT/PLAN Mr. KAYLAN FRIEDMANN is a 47 y.o. 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   MRI  ?  Left thalamo-mesencephalic junction infarct  MRA  normal  Carotid Doppler  pending   2D Echo  pending   LDL 76  HgbA1c 5.9  Lovenox 40 mg sq daily for VTE  prophylaxis  No antithrombotic prior to admission, now on aspirin 81 mg daily. Given mild stroke, recommend aspirin 81 mg and plavix 75 mg daily x 3 weeks, then aspirin alone. Orders adjusted.   Therapy recommendations:  OP PT  Disposition:  pending   Hyperlipidemia  Home meds:  No statin  LDL 76, goal < 70  Add low dose statin (added)  Continue statin at discharge  Other Stroke Risk Factors  Overweight , Body mass index is 29.89 kg/m., recommend weight loss, diet and exercise as appropriate   Obstructive sleep apnea, not on CPAP at home  Other Active Problems  Hx bleeding gastric ulcer (NSAIDS)  s/p OR and GERD. On protonix  Anxiety  Leukocytosis 11.6  Hospital day # 0  Burnetta Sabin, MSN, APRN, ANVP-BC, AGPCNP-BC Advanced Practice Stroke Nurse [place] for Schedule & Pager information 10/06/2017 1:49 PM  I have personally examined this patient, reviewed notes, independently viewed imaging studies, participated in medical decision making and plan of care.ROS completed by me personally and pertinent positives fully documented  I have made any additions or clarifications directly to the above note. Agree with note above. He presented with dizziness and right-sided disc numbness which could be compatible with a small brainstem infarct but MRI appearance is not very definitive and could possibly be an artifact as well. However the etiology of his symptoms is likely small vessel disease and continue ongoing stroke workup and antiplatelet therapy. Long discussion with patient and Dr. Broadus John and answered questions. Greater than 50% time during this 35 minute visit was spent on counseling and coordination of care about stroke and TIA risk discussion but prevention and treatment  Antony Contras, MD Medical Director [place] Pager: [phone] 10/06/2017 4:43 PM  To contact Stroke Continuity provider, please refer to [email or web address] After hours,  contact General Neurology

## 2017-10-06 NOTE — Progress Notes (Signed)
PT Cancellation Note  Patient Details Name: [name] MRN: [id number] DOB: 1970/08/27   Cancelled Treatment:    Reason Eval/Treat Not Completed: Patient at procedure or test/unavailable; pt in echo, will attempt later.    Reginia Naas 10/06/2017, 10:18 AM  Magda Kiel, PT [phone] 10/06/2017

## 2017-10-06 NOTE — Evaluation (Addendum)
Occupational Therapy Evaluation and defer further OT to [place] Patient Details Name: [name] MRN: [id number] DOB: May 28, 1970 Today's Date: 10/06/2017    History of Present Illness [name] is a 47 y.o. male with medical history significant of gastric ulcer disease, GI bleeding, clotting disorder, OSA not on CPAP, GERD, anxiety, who presented with right-sided weakness, dizziness, slurred speech, vertigo. CT, MRI negative for acute abnormality. MRA did reveal "Probable punctate focus of reduced diffusion within the left thalamo-mesencephalic junction. Findings are compatible with a small vessel acute/early subacute infarction"   Clinical Impression   PTA Pt independent in ADL/IADL and mobility. 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 Pt is currently supervision to Mod I for ADL and supervision for in room mobility/transfers. RUE with deficits in sensation, coordination, and strength. Safety with sensory deficits education provided and HEP for fine motor tasks as well as theraputty provided and reviewed in full. No further acute OT needs but Pt will require follow up with [facility] outpatient OT to continue to address RUE deficits and return Pt to PLOF.     Follow Up Recommendations  Outpatient OT([facility] Outpatient)    Equipment Recommendations  None recommended by OT(Pt has a shower chair if he should need it)    Recommendations for Other Services       Precautions / Restrictions Precautions Precautions: Fall Restrictions Weight Bearing Restrictions: No      Mobility Bed Mobility Overal bed mobility: Modified Independent                Transfers Overall transfer level: Needs assistance   Transfers: Sit to/from Stand Sit to Stand: Supervision         General transfer comment: no LOB noted in transfers    Balance Overall balance assessment: Mild deficits observed, not formally tested                                         ADL  either performed or assessed with clinical judgement   ADL Overall ADL's : Needs assistance/impaired Eating/Feeding: Independent   Grooming: Supervision/safety;Standing;Wash/dry hands;Wash/dry face;Oral care;Applying deodorant Grooming Details (indicate cue type and reason): sink level, able to open all containers Upper Body Bathing: Supervision/ safety   Lower Body Bathing: Supervison/ safety   Upper Body Dressing : Supervision/safety   Lower Body Dressing: Supervision/safety Lower Body Dressing Details (indicate cue type and reason): able to don socks without assist Toilet Transfer: Min guard;Ambulation Toilet Transfer Details (indicate cue type and reason): min guard for safety Toileting- Clothing Manipulation and Hygiene: Supervision/safety Toileting - Clothing Manipulation Details (indicate cue type and reason): able to manage underwear Tub/ Shower Transfer: Min guard;Tub transfer   Functional mobility during ADLs: Min guard;Supervision/safety(progressed to supervision level) General ADL Comments: decreased strength, coordination and sensation in RUE impacting ADL/IADL - especially with Pt's job     Museum/gallery curator      Pertinent Vitals/Pain Pain Assessment: 0-10 Pain Score: 1  Pain Location: RUE Pain Descriptors / Indicators: Heaviness;Tingling;Dull Pain Intervention(s): Monitored during session;Repositioned     Hand Dominance Left   Extremity/Trunk Assessment Upper Extremity Assessment Upper Extremity Assessment: RUE deficits/detail RUE Deficits / Details: grasp 4/5, full gross ROM increased time and effort required RUE Sensation: decreased light touch RUE Coordination: decreased fine motor;decreased gross motor(increased time and effort)  Lower Extremity Assessment Lower Extremity Assessment: Defer to PT evaluation   Cervical / Trunk Assessment Cervical / Trunk Assessment: Normal   Communication Communication Communication: No  difficulties   Cognition Arousal/Alertness: Awake/alert Behavior During Therapy: WFL for tasks assessed/performed Overall Cognitive Status: Within Functional Limits for tasks assessed                                     General Comments       Exercises Exercises: Other exercises Other Exercises Other Exercises: theraputty handout provided with red putty and practiced all the way through Other Exercises: fine motor coordination handout provided and reviewed in full   Shoulder Instructions      Home Living Family/patient expects to be discharged to:: Private residence Living Arrangements: Other relatives(brother and 18 y/o nephew) Available Help at Discharge: Family Type of Home: House Home Access: Stairs to enter Technical brewer of Steps: 1 Entrance Stairs-Rails: None Home Layout: Two level;Able to live on main level with bedroom/bathroom Alternate Level Stairs-Number of Steps: flight Alternate Level Stairs-Rails: Right Bathroom Shower/Tub: Teacher, early years/pre: Standard     Home Equipment: Bedside commode;Walker - 2 wheels          Prior Functioning/Environment Level of Independence: Independent        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[employer]; drives        OT Problem List: Decreased strength;Impaired balance (sitting and/or standing);Decreased coordination;Impaired sensation;Impaired UE functional use      OT Treatment/Interventions:      OT Goals(Current goals can be found in the care plan section) Acute Rehab OT Goals Patient Stated Goal: to get RUE to be able to use a screwdriver again OT Goal Formulation: With patient Time For Goal Achievement: 10/20/17 Potential to Achieve Goals: Good  OT Frequency:     Barriers to D/C:            Co-evaluation              AM-PAC PT "6 Clicks" Daily Activity     Outcome Measure Help from another person eating meals?: None Help from another person taking care  of personal grooming?: None Help from another person toileting, which includes using toliet, bedpan, or urinal?: None Help from another person bathing (including washing, rinsing, drying)?: None Help from another person to put on and taking off regular upper body clothing?: None Help from another person to put on and taking off regular lower body clothing?: None 6 Click Score: 24   End of Session Equipment Utilized During Treatment: Gait belt Nurse Communication: Mobility status  Activity Tolerance: Patient tolerated treatment well Patient left: in chair;with call bell/phone within reach  OT Visit Diagnosis: Unsteadiness on feet (R26.81);Hemiplegia and hemiparesis;Muscle weakness (generalized) (M62.81) Hemiplegia - Right/Left: Right Hemiplegia - dominant/non-dominant: Non-Dominant Hemiplegia - caused by: Unspecified                Time: 1638-4665 OT Time Calculation (min): 37 min Charges:  OT General Charges $OT Visit: 1 Visit OT Evaluation $OT Eval Moderate Complexity: 1 Mod OT Treatments $Self Care/Home Management : 8-22 mins  Hulda Humphrey OTR/L Acute Rehabilitation Services Pager: [phone] Office: [place] 10/06/2017, 10:21 AM

## 2017-10-06 NOTE — Progress Notes (Signed)
PROGRESS NOTE    [name]  CBJ:[id number] DOB: [birth year] DOA: 10/05/2017 PCP: [name], MD  Brief Narrative: 47 y.o. 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 -with resultant right-sided weakness and numbness -MRA is normal -Carotid duplex and 2-D echocardiogram are pending -LDL 76, hemoglobin A1c is 5.9 consistent with prediabetes -Neurology consulting, recommended aspirin 81 mg and Plavix 75 mg daily for 3 weeks followed by Plavix alone -PT/OT/SLP evaluations    History of bleeding gastric ulcer status post repair 2012 (NSAIDs) -continue Protonix twice a day     Anxiety -Continue clonazepam   prediabetes -lifestyle modification advised  DVT prophylaxis:lovenox Code Status: Full Code Family Communication: No family at bedside Disposition Plan: home pending workup  Consultants:   Neuro   Procedures:   Antimicrobials:    Subjective: -continues to have right-sided numbness and weakness  Objective: Vitals:   10/06/17 0300 10/06/17 0342 10/06/17 0750 10/06/17 1222  BP: 135/76 137/84 125/76 (!) 139/95  Pulse: 63 (!) 59 69 70  Resp: 17 18 18 18   Temp:  98.2 F (36.8 C) 98.2 F (36.8 C) 98.4 F (36.9 C)  TempSrc:  Oral Oral Oral  SpO2: 96% 97% 97% 96%  Weight:  91.8 kg    Height:  5\' 9"  (1.753 m)      Intake/Output Summary (Last 24 hours) at 10/06/2017 1404 Last data filed at 10/06/2017 0500 Gross per 24 hour  Intake 86.91 ml  Output -  Net 86.91 ml   Filed Weights   10/05/17 1450 10/06/17 0342  Weight: 88.5 kg 91.8 kg    Examination:  General exam: Appears calm and comfortable  Respiratory system: Clear to auscultation. Respiratory effort normal. Cardiovascular system: S1 & S2 heard,  RRR Gastrointestinal system: Abdomen is nondistended, soft and nontender.Normal bowel sounds heard. Central nervous system: Alert and oriented, cranial nerves intact, right upper extremity 4/5, right lower extremity 4+/5, diminished light touch in right upper extremity and lower extremity, plantars are withdrawal, DTR 2+ Extremities: no edema Skin: No rashes, lesions or ulcers Psychiatry: Judgement and insight appear normal. Mood & affect appropriate.     Data Reviewed:   CBC: Recent Labs  Lab 10/05/17 1505 10/05/17 1520  WBC 11.6*  --   NEUTROABS 8.3*  --   HGB 14.0 14.3  HCT 43.1 42.0  MCV 94.5  --   PLT 359  --    Basic Metabolic Panel: Recent Labs  Lab 10/05/17 1505 10/05/17 1520  NA 139 138  K 4.7 4.3  CL 104 105  CO2 25  --   GLUCOSE 92 93  BUN 16 19  CREATININE 1.07 1.10  CALCIUM 9.5  --    GFR: Estimated Creatinine Clearance: 92.9 mL/min (by C-G formula based on SCr of 1.1 mg/dL). Liver Function Tests: Recent Labs  Lab 10/05/17 1505  AST 32  ALT 24  ALKPHOS 50  BILITOT 0.8  PROT 6.4*  ALBUMIN 4.3   No results for input(s): LIPASE, AMYLASE in the last 168 hours. No results for input(s): AMMONIA in the last 168 hours. Coagulation Profile: Recent Labs  Lab 10/05/17 1505  INR 0.92   Cardiac Enzymes: No results for input(s): CKTOTAL, CKMB, CKMBINDEX, TROPONINI in the last 168 hours. BNP (last 3 results) No results for input(s): PROBNP in the  last 8760 hours. HbA1C: Recent Labs    10/06/17 0525  HGBA1C 5.9*   CBG: No results for input(s): GLUCAP in the last 168 hours. Lipid Profile: Recent Labs    10/06/17 0525  CHOL 169  HDL 67  LDLCALC 76  TRIG 129  CHOLHDL 2.5   Thyroid Function Tests: No results for input(s): TSH, T4TOTAL, FREET4, T3FREE, THYROIDAB in the last 72 hours. Anemia Panel: No results for input(s): VITAMINB12, FOLATE, FERRITIN, TIBC, IRON, RETICCTPCT in the last 72 hours. Urine analysis:    Component Value Date/Time    COLORURINE YELLOW 10/06/2017 0700   APPEARANCEUR CLEAR 10/06/2017 0700   LABSPEC 1.016 10/06/2017 0700   PHURINE 6.0 10/06/2017 0700   GLUCOSEU NEGATIVE 10/06/2017 0700   HGBUR NEGATIVE 10/06/2017 0700   BILIRUBINUR NEGATIVE 10/06/2017 0700   KETONESUR NEGATIVE 10/06/2017 0700   PROTEINUR NEGATIVE 10/06/2017 0700   UROBILINOGEN 0.2 09/07/2011 0625   NITRITE NEGATIVE 10/06/2017 0700   LEUKOCYTESUR NEGATIVE 10/06/2017 0700   Sepsis Labs: @LABRCNTIP (procalcitonin:4,lacticidven:4)  )No results found for this or any previous visit (from the past 240 hour(s)).       Radiology Studies: Ct Head Wo Contrast  Result Date: 10/05/2017 CLINICAL DATA:  47 y/o  M; 2 days of right-sided weakness. EXAM: CT HEAD WITHOUT CONTRAST TECHNIQUE: Contiguous axial images were obtained from the base of the skull through the vertex without intravenous contrast. COMPARISON:  None. FINDINGS: Brain: No evidence of acute infarction, hemorrhage, hydrocephalus, extra-axial collection or mass lesion/mass effect. Vascular: No hyperdense vessel or unexpected calcification. Skull: Normal. Negative for fracture or focal lesion. Sinuses/Orbits: No acute finding. Other: None. IMPRESSION: No acute intracranial abnormality identified. Unremarkable CT of the head. Electronically Signed   By: Kristine Garbe M.D.   On: 10/05/2017 17:30   Mr [name] Head Wo Contrast  Result Date: 10/05/2017 CLINICAL DATA:  47 y/o M; right arm weakness, right arm tingling, and right leg tingling for 2 days. EXAM: MRI HEAD WITHOUT CONTRAST MRA HEAD WITHOUT CONTRAST TECHNIQUE: Multiplanar, multiecho pulse sequences of the brain and surrounding structures were obtained without intravenous contrast. Angiographic images of the head were obtained using MRA technique without contrast. COMPARISON:  10/05/2017 CT head FINDINGS: MRI HEAD FINDINGS Brain: Probable punctate focus of reduced diffusion within the left thalamo-mesencephalic junction (series 3,  image 24 and series 350, image 24). No additional area of reduced diffusion identified within the brain. No focal mass effect, hemorrhage, extra-axial collection, structural abnormality, or herniation. Vascular: As below. Skull and upper cervical spine: Normal marrow signal. Sinuses/Orbits: Negative. Other: None. MRA HEAD FINDINGS Internal carotid arteries:  Patent. Anterior cerebral arteries:  Patent. Middle cerebral arteries: Patent. Anterior communicating artery: Patent. Posterior communicating arteries: Not identified, likely hypoplastic or absent. Posterior cerebral arteries:  Patent. Basilar artery:  Patent. Vertebral arteries:  Patent. No evidence of high-grade stenosis, large vessel occlusion, or aneurysm. IMPRESSION: 1. Probable punctate focus of reduced diffusion within the left thalamo-mesencephalic junction. Findings are compatible with a small vessel acute/early subacute infarction. No additional findings for differential considerations of infectious/inflammatory demyelination or vasculitis. 2. Normal MRA of the head. These results were called by telephone at the time of interpretation on 10/05/2017 at 11:06 pm to [place], who verbally acknowledged these results. Electronically Signed   By: Kristine Garbe M.D.   On: 10/05/2017 23:12   Mr Brain Wo Contrast  Result Date: 10/05/2017 CLINICAL DATA:  47 y/o M; right arm weakness, right arm tingling, and right leg tingling for 2 days. EXAM: MRI HEAD  WITHOUT CONTRAST MRA HEAD WITHOUT CONTRAST TECHNIQUE: Multiplanar, multiecho pulse sequences of the brain and surrounding structures were obtained without intravenous contrast. Angiographic images of the head were obtained using MRA technique without contrast. COMPARISON:  10/05/2017 CT head FINDINGS: MRI HEAD FINDINGS Brain: Probable punctate focus of reduced diffusion within the left thalamo-mesencephalic junction (series 3, image 24 and series 350, image 24). No additional area of reduced  diffusion identified within the brain. No focal mass effect, hemorrhage, extra-axial collection, structural abnormality, or herniation. Vascular: As below. Skull and upper cervical spine: Normal marrow signal. Sinuses/Orbits: Negative. Other: None. MRA HEAD FINDINGS Internal carotid arteries:  Patent. Anterior cerebral arteries:  Patent. Middle cerebral arteries: Patent. Anterior communicating artery: Patent. Posterior communicating arteries: Not identified, likely hypoplastic or absent. Posterior cerebral arteries:  Patent. Basilar artery:  Patent. Vertebral arteries:  Patent. No evidence of high-grade stenosis, large vessel occlusion, or aneurysm. IMPRESSION: 1. Probable punctate focus of reduced diffusion within the left thalamo-mesencephalic junction. Findings are compatible with a small vessel acute/early subacute infarction. No additional findings for differential considerations of infectious/inflammatory demyelination or vasculitis. 2. Normal MRA of the head. These results were called by telephone at the time of interpretation on 10/05/2017 at 11:06 pm to [place], who verbally acknowledged these results. Electronically Signed   By: Kristine Garbe M.D.   On: 10/05/2017 23:12        Scheduled Meds: . aspirin EC  81 mg Oral Daily  . atorvastatin  10 mg Oral q1800  . clonazePAM  0.5 mg Oral QHS  . clopidogrel  75 mg Oral Daily  . enoxaparin (LOVENOX) injection  40 mg Subcutaneous Q24H  . feeding supplement (ENSURE ENLIVE)  237 mL Oral BID BM  . multivitamin with minerals  1 tablet Oral Daily  . pantoprazole  40 mg Oral BID   Continuous Infusions:   LOS: 0 days    Time spent: 53min    Domenic Polite, MD [facility] Page via [email or web address], password [email or web address] After 7PM please contact night-coverage  10/06/2017, 2:04 PM

## 2017-10-07 DIAGNOSIS — Z8249 Family history of ischemic heart disease and other diseases of the circulatory system: Secondary | ICD-10-CM | POA: Diagnosis not present

## 2017-10-07 DIAGNOSIS — Z86010 Personal history of colonic polyps: Secondary | ICD-10-CM | POA: Diagnosis not present

## 2017-10-07 DIAGNOSIS — R42 Dizziness and giddiness: Secondary | ICD-10-CM | POA: Diagnosis present

## 2017-10-07 DIAGNOSIS — Z96642 Presence of left artificial hip joint: Secondary | ICD-10-CM | POA: Diagnosis present

## 2017-10-07 DIAGNOSIS — E663 Overweight: Secondary | ICD-10-CM | POA: Diagnosis present

## 2017-10-07 DIAGNOSIS — K219 Gastro-esophageal reflux disease without esophagitis: Secondary | ICD-10-CM | POA: Diagnosis present

## 2017-10-07 DIAGNOSIS — Z885 Allergy status to narcotic agent status: Secondary | ICD-10-CM | POA: Diagnosis not present

## 2017-10-07 DIAGNOSIS — I639 Cerebral infarction, unspecified: Secondary | ICD-10-CM | POA: Diagnosis present

## 2017-10-07 DIAGNOSIS — R7303 Prediabetes: Secondary | ICD-10-CM | POA: Diagnosis present

## 2017-10-07 DIAGNOSIS — D72829 Elevated white blood cell count, unspecified: Secondary | ICD-10-CM | POA: Diagnosis not present

## 2017-10-07 DIAGNOSIS — I6300 Cerebral infarction due to thrombosis of unspecified precerebral artery: Secondary | ICD-10-CM | POA: Diagnosis not present

## 2017-10-07 DIAGNOSIS — D689 Coagulation defect, unspecified: Secondary | ICD-10-CM | POA: Diagnosis present

## 2017-10-07 DIAGNOSIS — Z8711 Personal history of peptic ulcer disease: Secondary | ICD-10-CM | POA: Diagnosis not present

## 2017-10-07 DIAGNOSIS — Z833 Family history of diabetes mellitus: Secondary | ICD-10-CM | POA: Diagnosis not present

## 2017-10-07 DIAGNOSIS — R29703 NIHSS score 3: Secondary | ICD-10-CM | POA: Diagnosis present

## 2017-10-07 DIAGNOSIS — G8191 Hemiplegia, unspecified affecting right dominant side: Secondary | ICD-10-CM | POA: Diagnosis present

## 2017-10-07 DIAGNOSIS — H9190 Unspecified hearing loss, unspecified ear: Secondary | ICD-10-CM | POA: Diagnosis present

## 2017-10-07 DIAGNOSIS — Z888 Allergy status to other drugs, medicaments and biological substances status: Secondary | ICD-10-CM | POA: Diagnosis not present

## 2017-10-07 DIAGNOSIS — F419 Anxiety disorder, unspecified: Secondary | ICD-10-CM | POA: Diagnosis present

## 2017-10-07 DIAGNOSIS — Z6829 Body mass index (BMI) 29.0-29.9, adult: Secondary | ICD-10-CM | POA: Diagnosis not present

## 2017-10-07 DIAGNOSIS — F17220 Nicotine dependence, chewing tobacco, uncomplicated: Secondary | ICD-10-CM | POA: Diagnosis present

## 2017-10-07 DIAGNOSIS — R531 Weakness: Secondary | ICD-10-CM | POA: Diagnosis present

## 2017-10-07 DIAGNOSIS — G4733 Obstructive sleep apnea (adult) (pediatric): Secondary | ICD-10-CM | POA: Diagnosis present

## 2017-10-07 DIAGNOSIS — Z86718 Personal history of other venous thrombosis and embolism: Secondary | ICD-10-CM | POA: Diagnosis not present

## 2017-10-07 DIAGNOSIS — Z79899 Other long term (current) drug therapy: Secondary | ICD-10-CM | POA: Diagnosis not present

## 2017-10-07 DIAGNOSIS — R4781 Slurred speech: Secondary | ICD-10-CM | POA: Diagnosis present

## 2017-10-07 DIAGNOSIS — E785 Hyperlipidemia, unspecified: Secondary | ICD-10-CM | POA: Diagnosis present

## 2017-10-07 MED ORDER — ASPIRIN 81 MG PO TBEC: 81.0000 mg | DELAYED_RELEASE_TABLET | Freq: Every day | ORAL | Status: AC

## 2017-10-07 MED ORDER — UNABLE TO FIND: 0 refills | Status: DC

## 2017-10-07 MED ORDER — CLOPIDOGREL BISULFATE 75 MG PO TABS: 75.0000 mg | ORAL_TABLET | Freq: Every day | ORAL | 0 refills | Status: AC

## 2017-10-07 MED ORDER — ATORVASTATIN CALCIUM 40 MG PO TABS: 40.0000 mg | ORAL_TABLET | Freq: Every day | ORAL | 0 refills | Status: DC

## 2017-10-07 MED FILL — CLOPIDOGREL 75 MG TABLET: 75 | 21 days supply | Qty: 21 | Fill #0

## 2017-10-07 MED FILL — ATORVASTATIN 40 MG TABLET: 40 | 30 days supply | Qty: 30 | Fill #0

## 2017-10-07 NOTE — Care Management Note (Signed)
Case Management Note  Patient Details  Name: MAVRIC CORTRIGHT MRN: [id number] Date of Birth: 1970-07-20  Subjective/Objective:        Pt admitted with a stroke. He is from home with family. Pt IADL.            Action/Plan: Pt discharging home with self care. CM consulted for outpatient therapy. Patient would like to attend [facility]. Orders in Epic and information on the AVS.  Pt has transportation home.   Expected Discharge Date:  10/07/17               Expected Discharge Plan:  OP Rehab  In-House Referral:     Discharge planning Services  CM Consult  Post Acute Care Choice:    Choice offered to:     DME Arranged:    DME Agency:     HH Arranged:    HH Agency:     Status of Service:  Completed, signed off  If discussed at [employer] of Stay Meetings, dates discussed:    Additional Comments:  Pollie Friar, RN 10/07/2017, 12:55 PM

## 2017-10-07 NOTE — Progress Notes (Addendum)
Physical Therapy Treatment Patient Details Name: [name] MRN: [id number] DOB: Apr 25, 1970 Today's Date: 10/07/2017    History of Present Illness [name] is a 47 y.o. male with medical history significant of gastric ulcer disease, GI bleeding, clotting disorder, OSA not on CPAP, GERD, anxiety, who presented with right-sided weakness, dizziness, slurred speech, vertigo. CT, MRI negative for acute abnormality. MRA did reveal "Probable punctate focus of reduced diffusion within the left thalamo-mesencephalic junction. Findings are compatible with a small vessel acute/early subacute infarction"    PT Comments    Patient is making progress toward PT goals. Pt with improved balance noted with DGI score of 24/24. Pt continues to c/o vision deficits and headache that worsens with mobility. Pt is currently mod I/supervision level for OOB mobility. Pt will continue to benefit from further skilled PT services in outpatient neuro setting to maximize independence and safety with mobility.    Follow Up Recommendations  Outpatient PT(neurorehabilitation)     Equipment Recommendations  None recommended by PT    Recommendations for Other Services       Precautions / Restrictions Precautions Precautions: None Restrictions Weight Bearing Restrictions: No    Mobility  Bed Mobility Overal bed mobility: Modified Independent                Transfers Overall transfer level: Modified independent   Transfers: Sit to/from Stand           General transfer comment: increased time needed in sitting and standing due to dizziness  Ambulation/Gait Ambulation/Gait assistance: Supervision Gait Distance (Feet): (~415ft total) Assistive device: None Gait Pattern/deviations: Step-through pattern;Wide base of support Gait velocity: decreased   General Gait Details: decreased cadence but able to increased with cues and without LOB    Stairs             Wheelchair Mobility     Modified Rankin (Stroke Patients Only) Modified Rankin (Stroke Patients Only) Pre-Morbid Rankin Score: No symptoms Modified Rankin: Slight disability     Balance                                 Standardized Balance Assessment Standardized Balance Assessment : Dynamic Gait Index   Dynamic Gait Index Level Surface: Normal Change in Gait Speed: Normal Gait with Horizontal Head Turns: Normal Gait with Vertical Head Turns: Normal Gait and Pivot Turn: Normal Step Over Obstacle: Normal Step Around Obstacles: Normal Steps: Normal Total Score: 24      Cognition Arousal/Alertness: Awake/alert Behavior During Therapy: WFL for tasks assessed/performed Overall Cognitive Status: Within Functional Limits for tasks assessed                                        Exercises      General Comments        Pertinent Vitals/Pain Pain Assessment: Faces Faces Pain Scale: Hurts little more Pain Descriptors / Indicators: Headache Pain Intervention(s): Monitored during session;Repositioned    Home Living                      Prior Function            PT Goals (current goals can now be found in the care plan section) Acute Rehab PT Goals Patient Stated Goal: return to work PT Goal Formulation: With patient Time For Goal  Achievement: 10/09/17 Potential to Achieve Goals: Good Progress towards PT goals: Progressing toward goals    Frequency    Min 4X/week      PT Plan Current plan remains appropriate    Co-evaluation              AM-PAC PT "6 Clicks" Daily Activity  Outcome Measure  Difficulty turning over in bed (including adjusting bedclothes, sheets and blankets)?: None Difficulty moving from lying on back to sitting on the side of the bed? : None Difficulty sitting down on and standing up from a chair with arms (e.g., wheelchair, bedside commode, etc,.)?: A Little Help needed moving to and from a bed to chair (including  a wheelchair)?: A Little Help needed walking in hospital room?: A Little Help needed climbing 3-5 steps with a railing? : A Little 6 Click Score: 20    End of Session Equipment Utilized During Treatment: Gait belt Activity Tolerance: Patient tolerated treatment well Patient left: with call bell/phone within reach;with family/visitor present;in chair Nurse Communication: Mobility status PT Visit Diagnosis: Other abnormalities of gait and mobility (R26.89)     Time: 4854-6270 PT Time Calculation (min) (ACUTE ONLY): 16 min  Charges:  $Gait Training: 8-22 mins                     Earney Navy, PTA Pager: [phone]     Darliss Cheney 10/07/2017, 11:36 AM

## 2017-10-07 NOTE — Progress Notes (Signed)
STROKE TEAM PROGRESS NOTE    INTERVAL HISTORY His family is not  at the bedside.  He states is doing well and would like to go home with outpatient therapies.  Vitals:   10/07/17 0006 10/07/17 0438 10/07/17 0742 10/07/17 1205  BP: (!) 103/59 122/80 127/83 138/83  Pulse: 80 64 62 84  Resp: 14 18 18 18   Temp: 98.4 F (36.9 C) 98.3 F (36.8 C) 97.7 F (36.5 C) 98.2 F (36.8 C)  TempSrc: Oral Oral Oral Oral  SpO2: 95% 97% 95% 96%  Weight:      Height:        CBC:  Recent Labs  Lab 10/05/17 1505 10/05/17 1520  WBC 11.6*  --   NEUTROABS 8.3*  --   HGB 14.0 14.3  HCT 43.1 42.0  MCV 94.5  --   PLT 359  --     Basic Metabolic Panel:  Recent Labs  Lab 10/05/17 1505 10/05/17 1520  NA 139 138  K 4.7 4.3  CL 104 105  CO2 25  --   GLUCOSE 92 93  BUN 16 19  CREATININE 1.07 1.10  CALCIUM 9.5  --    Lipid Panel:     Component Value Date/Time   CHOL 169 10/06/2017 0525   TRIG 129 10/06/2017 0525   HDL 67 10/06/2017 0525   CHOLHDL 2.5 10/06/2017 0525   VLDL 26 10/06/2017 0525   LDLCALC 76 10/06/2017 0525   HgbA1c:  Lab Results  Component Value Date   HGBA1C 5.9 (H) 10/06/2017   Urine Drug Screen: No results found for: LABOPIA, COCAINSCRNUR, LABBENZ, AMPHETMU, THCU, LABBARB  Alcohol Level No results found for: ETH  IMAGING Ct Head Wo Contrast  Result Date: 10/05/2017 CLINICAL DATA:  47 y/o  M; 2 days of right-sided weakness. EXAM: CT HEAD WITHOUT CONTRAST TECHNIQUE: Contiguous axial images were obtained from the base of the skull through the vertex without intravenous contrast. COMPARISON:  None. FINDINGS: Brain: No evidence of acute infarction, hemorrhage, hydrocephalus, extra-axial collection or mass lesion/mass effect. Vascular: No hyperdense vessel or unexpected calcification. Skull: Normal. Negative for fracture or focal lesion. Sinuses/Orbits: No acute finding. Other: None. IMPRESSION: No acute intracranial abnormality identified. Unremarkable CT of the head.  Electronically Signed   By: Kristine Garbe M.D.   On: 10/05/2017 17:30   Mr [name] Head Wo Contrast  Result Date: 10/05/2017 CLINICAL DATA:  47 y/o M; right arm weakness, right arm tingling, and right leg tingling for 2 days. EXAM: MRI HEAD WITHOUT CONTRAST MRA HEAD WITHOUT CONTRAST TECHNIQUE: Multiplanar, multiecho pulse sequences of the brain and surrounding structures were obtained without intravenous contrast. Angiographic images of the head were obtained using MRA technique without contrast. COMPARISON:  10/05/2017 CT head FINDINGS: MRI HEAD FINDINGS Brain: Probable punctate focus of reduced diffusion within the left thalamo-mesencephalic junction (series 3, image 24 and series 350, image 24). No additional area of reduced diffusion identified within the brain. No focal mass effect, hemorrhage, extra-axial collection, structural abnormality, or herniation. Vascular: As below. Skull and upper cervical spine: Normal marrow signal. Sinuses/Orbits: Negative. Other: None. MRA HEAD FINDINGS Internal carotid arteries:  Patent. Anterior cerebral arteries:  Patent. Middle cerebral arteries: Patent. Anterior communicating artery: Patent. Posterior communicating arteries: Not identified, likely hypoplastic or absent. Posterior cerebral arteries:  Patent. Basilar artery:  Patent. Vertebral arteries:  Patent. No evidence of high-grade stenosis, large vessel occlusion, or aneurysm. IMPRESSION: 1. Probable punctate focus of reduced diffusion within the left thalamo-mesencephalic junction. Findings are compatible with  a small vessel acute/early subacute infarction. No additional findings for differential considerations of infectious/inflammatory demyelination or vasculitis. 2. Normal MRA of the head. These results were called by telephone at the time of interpretation on 10/05/2017 at 11:06 pm to [place], who verbally acknowledged these results. Electronically Signed   By: Kristine Garbe M.D.   On:  10/05/2017 23:12   Mr Brain Wo Contrast  Result Date: 10/05/2017 CLINICAL DATA:  47 y/o M; right arm weakness, right arm tingling, and right leg tingling for 2 days. EXAM: MRI HEAD WITHOUT CONTRAST MRA HEAD WITHOUT CONTRAST TECHNIQUE: Multiplanar, multiecho pulse sequences of the brain and surrounding structures were obtained without intravenous contrast. Angiographic images of the head were obtained using MRA technique without contrast. COMPARISON:  10/05/2017 CT head FINDINGS: MRI HEAD FINDINGS Brain: Probable punctate focus of reduced diffusion within the left thalamo-mesencephalic junction (series 3, image 24 and series 350, image 24). No additional area of reduced diffusion identified within the brain. No focal mass effect, hemorrhage, extra-axial collection, structural abnormality, or herniation. Vascular: As below. Skull and upper cervical spine: Normal marrow signal. Sinuses/Orbits: Negative. Other: None. MRA HEAD FINDINGS Internal carotid arteries:  Patent. Anterior cerebral arteries:  Patent. Middle cerebral arteries: Patent. Anterior communicating artery: Patent. Posterior communicating arteries: Not identified, likely hypoplastic or absent. Posterior cerebral arteries:  Patent. Basilar artery:  Patent. Vertebral arteries:  Patent. No evidence of high-grade stenosis, large vessel occlusion, or aneurysm. IMPRESSION: 1. Probable punctate focus of reduced diffusion within the left thalamo-mesencephalic junction. Findings are compatible with a small vessel acute/early subacute infarction. No additional findings for differential considerations of infectious/inflammatory demyelination or vasculitis. 2. Normal MRA of the head. These results were called by telephone at the time of interpretation on 10/05/2017 at 11:06 pm to [place], who verbally acknowledged these results. Electronically Signed   By: Kristine Garbe M.D.   On: 10/05/2017 23:12    PHYSICAL EXAM Pleasant middle-age male currently  not in distress. . Afebrile. Head is nontraumatic. Neck is supple without bruit.    Cardiac exam no murmur or gallop. Lungs are clear to auscultation. Distal pulses are well felt.  Neurological Exam ;  Awake  Alert oriented x 3. Normal speech and language.eye movements full without nystagmus.fundi were not visualized. Vision acuity and fields appear normal. Hearing is normal. Palatal movements are normal. Face symmetric. Tongue midline. Normal strength, tone, reflexes and coordination. Normal sensation. Gait deferred.  ASSESSMENT/PLAN [name] is a 47 y.o. male with history of anxiety, gastric ulcer s/p OR, clotting d/o, prediabetes and OSA presenting with dizziness, R arm numbness and HP, RLE altered sensation and HA..   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   MRI  ?  Left thalamo-mesencephalic junction infarct  MRA  normal  Carotid Doppler  No significant extracranial stenosis  2D Echo , ejection fraction. No current x-rays of embolism.  LDL 76  HgbA1c 5.9  Lovenox 40 mg sq daily for VTE prophylaxis  No antithrombotic prior to admission, now on aspirin 81 mg daily. Given mild stroke, recommend aspirin 81 mg and plavix 75 mg daily x 3 weeks, then aspirin alone. Orders adjusted.   Therapy recommendations:  OP PT  Disposition:  pending   Hyperlipidemia  Home meds:  No statin  LDL 76, goal < 70  Add low dose statin (added)  Continue statin at discharge  Other Stroke Risk Factors  Overweight ,  Body mass index is 29.89 kg/m., recommend weight loss, diet and exercise as appropriate   Obstructive sleep apnea, not on CPAP at home  Other Active Problems  Hx bleeding gastric ulcer (NSAIDS)  s/p OR and GERD. On protonix  Anxiety  Leukocytosis 11.6  Hospital day # 0   . He presented with dizziness and right-sided disc numbness which could be compatible with a small  brainstem infarct but MRI appearance is not very definitive and could possibly be an artifact as well. However the etiology of his symptoms is likely small vessel disease and continue ongoing stroke workup and antiplatelet therapy. Long discussion with patient   answered questions. Greater than 50% time during this 25 minute visit was spent on counseling and coordination of care about stroke and TIA risk discussion but prevention and treatment.Follow-up as an outpatient in stroke clinic in 6 weeks. Stroke team will sign off. Kindly call for questions if any.  Antony Contras, MD Medical Director [facility] Pager: [phone] 10/07/2017 2:11 PM  To contact Stroke Continuity provider, please refer to [email or web address] After hours, contact General Neurology

## 2017-10-08 ENCOUNTER — Telehealth: Payer: Self-pay | Admitting: Behavioral Health

## 2017-10-08 ENCOUNTER — Other Ambulatory Visit: Payer: Self-pay | Admitting: *Deleted

## 2017-10-08 NOTE — Telephone Encounter (Signed)
Unable to reach patient at time of TCM/Hospital Follow-up Call. Left message for patient to return call when available.  

## 2017-10-08 NOTE — Patient Outreach (Signed)
[facility]) Care Management  10/08/2017  [name] 08-15-1970 [id number]  Subjective: Telephone call to patient's home  / mobile number, no answer, left HIPAA compliant voicemail message, and requested call back.    Objective:Per KPN (Knowledge Performance Now, point of care tool) and chart review, patient hospitalized 10/05/17 -10/07/17 for acute stroke.  Patient hospitalized 01/22/17 -01/26/17 forRecurrent Hiatal hernia with worsening heartburn/reflux and dysphagia. Status post Laparoscopic/Robotic lysis of adhesions x 2.5 hours,Robotic reduction of paraesophageal hiatal hernia,Primary repair of hiatal hernia over pledgets, Absorbable Mesh reinforcement,Anteriorandposterior gastropexy,Nissen fundoplication 2 cm over a 56-French bougie,EGDon 01/22/17.   [facility] Care Management last follow up on 02/16/17 and patient closed due to unable to reach.      Assessment: Received UMR Transition of care referral on 10/08/17.Transition of care follow up pending patient contact.      Plan:RNCM will send unsuccessful outreach  letter, [facility] pamphlet, will call patient for 2nd telephone outreach attempt, transition of care follow up, and proceed with case closure, within 10 business days if no return call.      Paxson Harrower H. Annia Friendly, BSN, [place] Management [facility] Telephonic CM Phone: [phone] Fax: [phone]

## 2017-10-10 MED FILL — ZOLPIDEM TART ER 12.5 MG TA: 12.5 | 30 days supply | Qty: 30 | Fill #2

## 2017-10-11 ENCOUNTER — Telehealth: Payer: Self-pay | Admitting: Family Medicine

## 2017-10-11 LAB — CULTURE, BLOOD (ROUTINE X 2)
CULTURE: NO GROWTH
CULTURE: NO GROWTH

## 2017-10-11 MED FILL — clonazePAM 0.5 MG TBDP: 0.5 | 30 days supply | Qty: 60 | Fill #0

## 2017-10-11 NOTE — Addendum Note (Signed)
Addended by: Eduard Roux E on: 10/11/2017 11:34 AM   Modules accepted: Orders

## 2017-10-11 NOTE — Telephone Encounter (Signed)
Copied from [place] [id number]. Topic: Quick Communication - Rx Refill/Question >> Oct 11, 2017  8:27 AM [name] B wrote: Medication: clonazePAM (KLONOPIN) 0.5 MG tablet [[id number]]  Pharmacy called to inform pcp that they were going back to the OTD tablets; accidentally had filled the regular tables for the pt

## 2017-10-11 NOTE — Telephone Encounter (Signed)
Transition Care Management Follow-up Telephone Call   Date discharged? 10/07/17   How have you been since you were released from the hospital? Patient stated, "I'm doing ok, just tired but felt like that before going into the hospital".   Do you understand why you were in the hospital? yes   Do you understand the discharge instructions? yes   Where were you discharged to? Home   Items Reviewed:  Medications reviewed: yes  Allergies reviewed: yes  Dietary changes reviewed: yes, low sodium, heart healthy diet  Referrals reviewed: yes, follow-up with PCP, Neurology & PT/OT.   Functional Questionnaire:   Activities of Daily Living (ADLs):   He states they are independent in the following: ambulation, bathing and hygiene, feeding, continence, grooming, toileting and dressing States they require assistance with the following: None   Any transportation issues/concerns?: no   Any patient concerns? yes, pt. voiced that he's having some headaches now & wanted to discuss one of his new medications.   Confirmed importance and date/time of follow-up visits scheduled yes, 10/13/17 at 11 AM.  Provider Appointment booked with Dr. Deborra Medina.  Confirmed with patient if condition begins to worsen call PCP or go to the ER.  Patient was given the office number and encouraged to call back with question or concerns.  : yes

## 2017-10-11 NOTE — Telephone Encounter (Signed)
FYI from pharmacy

## 2017-10-12 ENCOUNTER — Ambulatory Visit: Payer: No Typology Code available for payment source | Admitting: Family Medicine

## 2017-10-13 ENCOUNTER — Inpatient Hospital Stay: Payer: No Typology Code available for payment source | Admitting: Family Medicine

## 2017-10-13 ENCOUNTER — Ambulatory Visit (INDEPENDENT_AMBULATORY_CARE_PROVIDER_SITE_OTHER): Payer: No Typology Code available for payment source | Admitting: Family Medicine

## 2017-10-13 ENCOUNTER — Other Ambulatory Visit: Payer: Self-pay | Admitting: *Deleted

## 2017-10-13 ENCOUNTER — Encounter: Payer: Self-pay | Admitting: Family Medicine

## 2017-10-13 VITALS — BP 106/62 | HR 97 | Temp 98.5°F | Ht 65.8 in | Wt 196.2 lb

## 2017-10-13 DIAGNOSIS — Z23 Encounter for immunization: Secondary | ICD-10-CM | POA: Diagnosis not present

## 2017-10-13 DIAGNOSIS — I69351 Hemiplegia and hemiparesis following cerebral infarction affecting right dominant side: Secondary | ICD-10-CM | POA: Diagnosis not present

## 2017-10-13 DIAGNOSIS — I6300 Cerebral infarction due to thrombosis of unspecified precerebral artery: Secondary | ICD-10-CM

## 2017-10-13 DIAGNOSIS — F5101 Primary insomnia: Secondary | ICD-10-CM | POA: Diagnosis not present

## 2017-10-13 DIAGNOSIS — R531 Weakness: Secondary | ICD-10-CM

## 2017-10-13 DIAGNOSIS — I69320 Aphasia following cerebral infarction: Secondary | ICD-10-CM

## 2017-10-13 MED ORDER — ZOLPIDEM TARTRATE ER 12.5 MG PO TBCR: 12.5000 mg | EXTENDED_RELEASE_TABLET | Freq: Every evening | ORAL | 1 refills | Status: DC | PRN

## 2017-10-13 NOTE — Assessment & Plan Note (Signed)
Difficult issue for him as he really feels he needs sleep with his current post hospitalization.  He does not want to try to increase dose of trazodone and would like to restart ambien for a short period of time.  This is reasonable as he had been on it for years and did attempt alternatives prior to his stroke/hospitalization.  eRx sent for ambien. Call or return to clinic prn if these symptoms worsen or fail to improve as anticipated. The patient indicates understanding of these issues and agrees with the plan.

## 2017-10-13 NOTE — Progress Notes (Signed)
Subjective:   Patient ID: [name], male    DOB: [birth year], 47 y.o.   MRN: [id number]  [name] is a pleasant 47 y.o. year old male who presents to clinic today with Hospitalization Follow-up (Patient is here today for a hospital F/U.  Pt was admitted at [facility] from 9.3.19-9.5.19 for Sx which were confirmed to be a stroke.  Was advised to start ASA, Atorvastatin, and Clopidogrel.  To change how he takes the Clonazepam and to D/C Vicodin, Zofran, and Ambien.  Advised to decrease NA for heart healthy diet, carb modified diet, increase activity and to see PEP.  F/U with OP rehab and then see Dr. Leonie Man in 4 weeks.  He states that today he feels tired, has a headache. Sonata is ineffective.)  on 10/13/2017  HPI:  Unfortunately there is no discharge summary.  Presented to the ER on 10/05/17 with right arm numbness, right leg numbness, headache and word finding issues.  No facial droop. Head CT showed no acute stroke. MRI/MRA of head showed acute punctate infarct- ? Left thalamo- mesencephalic junction infarct  IMPRESSION: 1. Probable punctate focus of reduced diffusion within the left thalamo-mesencephalic junction. Findings are compatible with a small vessel acute/early subacute infarction. No additional findings for differential considerations of infectious/inflammatory demyelination or vasculitis. 2. Normal MRA of the head.  Patient was admitted and neurology, Dr. Leonie Man, was consulted.  Carotid Doppler  No significant extracranial stenosis  2D Echo , ejection fraction. No current x-rays of embolism.  LDL 76  HgbA1c 5.9  Recommended that since stroke was mild, for him to be discharged home on ASA 81 mg daily, plavix 75 mg daily x 3 weeks and then ASA alone.  He recommended OT/PT- Waunita Schooner is still waiting for a call from them to schedule this.  He is still having some residual word finding issues and feels his right arm is "heavier" than his left.  Insomnia- when I saw patient  on 7.24.19 (note reviewed) for CPX, we discussed his problem with recurrent insomnia.  Had been on ambien for years, maximum dose, and felt it was not working as well. We therefore agreed to try trazodone 25-50 mg nightly along with Sonata as needed. He feels neither rx is working and asking to go back on [place], which he was given in the hospital.    Current Outpatient Medications on File Prior to Visit  Medication Sig Dispense Refill  . aspirin EC 81 MG EC tablet Take 1 tablet (81 mg total) by mouth daily.    Marland Kitchen atorvastatin (LIPITOR) 40 MG tablet Take 1 tablet (40 mg total) by mouth daily at 6 PM. 30 tablet 0  . clonazePAM (KLONOPIN) 0.5 MG tablet Take 0.5 mg by mouth at bedtime.  2  . clopidogrel (PLAVIX) 75 MG tablet Take 1 tablet (75 mg total) by mouth daily for 21 days. 21 tablet 0  . pantoprazole (PROTONIX) 40 MG tablet Take 1 tablet (40 mg total) by mouth 2 (two) times daily. 180 tablet 2  . UNABLE TO FIND Outpatient Physical and Occupational Therapy 1 each 0  . Vitamin D, Ergocalciferol, (DRISDOL) 50000 units CAPS capsule Take 1 capsule by mouth every 7 days. Take for 2 months. (Patient taking differently: Take 50,000 Units by mouth every 7 (seven) days. On Monday) 4 capsule 0   No current facility-administered medications on file prior to visit.     Allergies  Allergen Reactions  . Dilaudid [Hydromorphone Hcl] Rash and Other (See Comments)  Shakey  . Metoclopramide Hcl Hives, Swelling, Rash and Other (See Comments)    Swelling in lips  . Nsaids     H/o bleeding gastric ulcers    Past Medical History:  Diagnosis Date  . Anxiety    takes Xanax daily prn  . Chronic gastric ulcer with bleeding s/p suture repair 2012 09/29/2010   Head 3 EGD which failed to stop the bleeding. Had exploratory laparotomy on 10/04/2010. Possible cause of the gastric ulcer was NSAID use.   . Constipation    related to pain meds  . Dizziness    occasionally  . Erosive esophagitis   . External  hemorrhoid, bleeding   . Gastric ulcer    history of  . GERD (gastroesophageal reflux disease)    takes Protonix bid  . Hiatal hernia   . History of blood clots 2012   in abdomen  . History of blood transfusion 2012  . Incisional hernia s/p open repair w mesh Aug 2013 05/26/2011  . Joint pain    knees  . Nocturia    depends on amount of fluid he drinks  . Pneumonia   . Pre-diabetes   . Primary localized osteoarthritis of left hip 04/24/2014  . Serrated polyp of colon   . Sleep apnea    no cpap  mild no cpap needed  . Upper GI bleed   . Ventral hernia     Past Surgical History:  Procedure Laterality Date  . COLONOSCOPY    . ESOPHAGEAL MANOMETRY N/A 10/19/2016   Procedure: ESOPHAGEAL MANOMETRY (EM);  Surgeon: Mauri Pole, MD;  Location: [facility];  Service: Endoscopy;  Laterality: N/A;  . ESOPHAGOGASTRODUODENOSCOPY    . ESOPHAGOGASTRODUODENOSCOPY N/A 01/22/2017   Procedure: ESOPHAGOGASTRODUODENOSCOPY (EGD);  Surgeon: [name], MD;  Location: [facility];  Service: General;  Laterality: N/A;  . INCISIONAL HERNIA REPAIR  09/04/2011   Procedure: HERNIA REPAIR INCISIONAL;  Surgeon: Madilyn Hook, DO;  Location: [place];  Service: General;  Laterality: N/A;  debridment calcified mass  . INSERTION OF MESH  09/04/2011   retrorectus ultrapro "30x30cm"  . INSERTION OF MESH N/A 01/22/2017   Procedure: INSERTION OF MESH;  Surgeon: [name], MD;  Location: [facility];  Service: General;  Laterality: N/A;  . NISSEN FUNDOPLICATION     Open 0712R  . POLYPECTOMY    . STOMACH SURGERY  10/05/2010   Oversewing of gastric ulcer.  Dr Brantley Stage  . TOTAL HIP ARTHROPLASTY Left 04/24/2014   Procedure: LEFT TOTAL HIP ARTHROPLASTY;  Surgeon: Marchia Bond, MD;  Location: [facility];  Service: Orthopedics;  Laterality: Left;  . UPPER GASTROINTESTINAL ENDOSCOPY      Family History  Problem Relation Age of Onset  . Diabetes Mother   . Liver disease Mother   . Diabetes Father   . Heart disease Father    . Colon cancer Neg Hx   . Stomach cancer Neg Hx   . Rectal cancer Neg Hx   . Esophageal cancer Neg Hx   . Colon polyps Neg Hx     Social History   Socioeconomic History  . Marital status: Single    Spouse name: Not on file  . Number of children: 1  . Years of education: Not on file  . Highest education level: Not on file  Occupational History  . Occupation: Maintenance - [employer]  Social Needs  . Financial resource strain: Not on file  . Food insecurity:    Worry: Not on file  Inability: Not on file  . Transportation needs:    Medical: Not on file    Non-medical: Not on file  Tobacco Use  . Smoking status: Never Smoker  . Smokeless tobacco: Current User    Types: Chew  . Tobacco comment: occ-Chew  Substance and Sexual Activity  . Alcohol use: Yes    Alcohol/week: 0.0 standard drinks    Comment: 09/04/11 "very seldom"  . Drug use: No  . Sexual activity: Yes  Lifestyle  . Physical activity:    Days per week: Not on file    Minutes per session: Not on file  . Stress: Not on file  Relationships  . Social connections:    Talks on phone: Not on file    Gets together: Not on file    Attends religious service: Not on file    Active member of club or organization: Not on file    Attends meetings of clubs or organizations: Not on file    Relationship status: Not on file  . Intimate partner violence:    Fear of current or ex partner: Not on file    Emotionally abused: Not on file    Physically abused: Not on file    Forced sexual activity: Not on file  Other Topics Concern  . Not on file  Social History Narrative  . Not on file   The PMH, PSH, Social History, Family History, Medications, and allergies have been reviewed in [facility], and have been updated if relevant.   Review of Systems  Constitutional: Negative.   HENT: Negative.   Respiratory: Negative.   Cardiovascular: Negative.   Gastrointestinal: Negative.   Musculoskeletal: Negative.   Skin: Negative.    Neurological: Positive for weakness and headaches. Negative for dizziness, tremors, seizures, syncope, facial asymmetry, speech difficulty, light-headedness and numbness.  Psychiatric/Behavioral: Negative for self-injury and sleep disturbance. The patient is not nervous/anxious.   All other systems reviewed and are negative.      Objective:    BP 106/62 (BP Location: Left Arm, Patient Position: Sitting, Cuff Size: Normal)   Pulse 97   Temp 98.5 F (36.9 C) (Oral)   Ht 5' 5.8" (1.671 m)   Wt 196 lb 3.2 oz (89 kg)   SpO2 97%   BMI 31.86 kg/m    Physical Exam  Constitutional: He is oriented to person, place, and time. He appears well-developed and well-nourished. No distress.  HENT:  Head: Normocephalic and atraumatic.  Eyes: EOM are normal.  Neck: Normal range of motion.  Cardiovascular: Regular rhythm.  Pulmonary/Chest: Effort normal.  Musculoskeletal: Normal range of motion.  Neurological: He is alert and oriented to person, place, and time. No cranial nerve deficit. Coordination normal.  Decreased grip strength right compared to left  Skin: Skin is warm and dry. He is not diaphoretic.  Psychiatric: He has a normal mood and affect. His behavior is normal. Judgment and thought content normal.  Nursing note and vitals reviewed.         Assessment & Plan:   Need for influenza vaccination - Plan: Flu Vaccine QUAD 6+ mos PF IM (Fluarix Quad PF)  Primary insomnia No follow-ups on file.

## 2017-10-13 NOTE — Assessment & Plan Note (Signed)
Secondary to acute CVA. See above.

## 2017-10-13 NOTE — Assessment & Plan Note (Signed)
Mild residual deficit. Continue current rx.  He will be following up with neurology, Dr. Leonie Man in 4 weeks. I did write him out of work for the next two weeks as he did try to go back, blood pressure increased and he had more confusion/word finding issues.

## 2017-10-13 NOTE — Patient Outreach (Signed)
[facility]) Care Management  10/13/2017  BRANDAN ROBICHEAUX 1971-01-09 [id number]   Subjective: Telephone call to patient's home  / mobile number, no answer, left HIPAA compliant voicemail message, and requested call back.    Objective:Per KPN (Knowledge Performance Now, point of care tool) and chart review, patient hospitalized 10/05/17 -10/07/17 for acute stroke.  Patient hospitalized 01/22/17 -01/26/17 forRecurrent Hiatal hernia with worsening heartburn/reflux and dysphagia. Status post Laparoscopic/Robotic lysis of adhesions x 2.5 hours,Robotic reduction of paraesophageal hiatal hernia,Primary repair of hiatal hernia over pledgets, Absorbable Mesh reinforcement,Anteriorandposterior gastropexy,Nissen fundoplication 2 cm over a 56-French bougie,EGDon 01/22/17.   [facility] Care Management last follow up on 02/16/17 and patient closed due to unable to reach.      Assessment: Received UMR Transition of care referral on 10/08/17.Transition of care follow up pending patient contact.      Plan:RNCM has sent unsuccessful outreach  letter, [facility] pamphlet, will call patient for 3rd telephone outreach attempt, transition of care follow up, and proceed with case closure, within 10 business days if no return call.     Severina Sykora H. Annia Friendly, BSN, [place] Management [facility] Telephonic CM Phone: [phone] Fax: [phone]

## 2017-10-14 ENCOUNTER — Encounter: Payer: Self-pay | Admitting: *Deleted

## 2017-10-14 ENCOUNTER — Other Ambulatory Visit: Payer: Self-pay | Admitting: *Deleted

## 2017-10-14 NOTE — Patient Outreach (Signed)
[name] [place]) Care [name]  10/14/2017  [name] 10/21/1970 [id number]   Subjective: Telephone call to patient's home / mobile number, spoke with patient, and HIPAA verified.  Discussed [name] Care [name] UMR Transition of care follow up, patient voiced understanding, and is in agreement to follow up.    Patient states feeling alright, still having a dazed feeling, tiring to get used to things, having headaches from plavix, MD is aware, and no change in medication therapy at this time.   States he has a follow up appointment with primary MD on 10/13/17 and appointment went well.   States he will call today to set up outpatient therapy and follow up appointment with neurologist.  RNCM advised patient that order for outpatient physical therapy, occupational therapy, are listed in under medication review, patient voices understanding, states he accidentally gave the prescriptions to the pharmacy, and will notified outpatient rehab of this when calls to set up appointment.   Patient states he is able to manage self care and has assistance as needed.   Patient voices understanding of medical diagnosis and treatment plan.  States he is accessing the following [facility] benefits: outpatient pharmacy, [name] indemnity (not sure if chosen, verbally given contact number for [name] [phone], will file claim if appropriate, verbally given contact number for [name] [phone] to request itemized bill), and will call [employer] today to start family medical leave act (FMLA) process. Discussed diabetes disease [name] resources for [name], patient voices understanding, and states he is active with [name].   Patient states he does not have any education material, transition of care, care coordination, disease [name], disease monitoring, transportation, community resource, or pharmacy needs at this time.   States he is very appreciative of the follow up and is  in agreement to receive [name] information.     Objective:Per KPN (Knowledge Performance Now, point of care tool) and chart review,patient hospitalized 10/05/17 -10/07/17 for acute stroke. Patient hospitalized 01/22/17 -01/26/17 forRecurrent Hiatal hernia with worsening heartburn/reflux and dysphagia. Status post Laparoscopic/Robotic lysis of adhesions x 2.5 hours,Robotic reduction of paraesophageal hiatal hernia,Primary repair of hiatal hernia over pledgets, Absorbable Mesh reinforcement,Anteriorandposterior gastropexy,Nissen fundoplication 2 cm over a 56-French bougie,EGDon 01/22/17.  [name] Care [name] last follow up on 02/16/17 and patient closed due to unable to reach.     Assessment: Received UMR Transition of care referral on9/6/19.Transition of care follow up completed, no care [name] needs, and will proceed with case closure.      Plan:RNCM will send patient successful outreach letter, [name] pamphlet, and magnet. RNCM will complete case closure due to follow up completed / no care [name] needs.       [name] Friendly, BSN, [place] [name] Telephonic CM Phone: [phone] Fax: [phone]

## 2017-10-20 ENCOUNTER — Telehealth: Payer: No Typology Code available for payment source | Admitting: Family

## 2017-10-20 DIAGNOSIS — J019 Acute sinusitis, unspecified: Secondary | ICD-10-CM

## 2017-10-20 MED ORDER — AMOXICILLIN-POT CLAVULANATE 875-125 MG PO TABS: 1.0000 | ORAL_TABLET | Freq: Two times a day (BID) | ORAL | 0 refills | Status: DC

## 2017-10-20 MED FILL — AMOX-CLAV 875-125 MG TABLET: 875-125 | 7 days supply | Qty: 14 | Fill #0

## 2017-10-20 NOTE — Progress Notes (Signed)

## 2017-10-20 NOTE — Discharge Summary (Signed)
Physician Discharge Summary  [name] PYP:[id number] DOB: 1970/05/09 DOA: 10/05/2017  PCP: Bruce Passy, MD  Admit date: 10/05/2017 Discharge date: 10/07/2017  Time spent: 35 minutes  Recommendations for Outpatient Follow-up:  1. Dr.Sethi in 1 month 2. PCP in 1 week   Discharge Diagnoses:  Principal Problem:   Stroke [facility]) Active Problems:   History of bleeding gastric ulcer status post repair 2012 (NSAIDs)   Anxiety   Gastroesophageal reflux disease   Leukocytosis   Discharge Condition: stable  Diet recommendation: heart healthy  Filed Weights   10/05/17 1450 10/06/17 0342  Weight: 88.5 kg 91.8 kg    History of present illness:  47 y.o.malewith history of anxiety, gastric ulcer with history of operative repair, clotting d/o, prediabetes and OSApresenting with dizziness, right-sided numbness, weakness and slurring of speech   Hospital Course:  Acute stroke -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 -with resultant right-sided weakness and numbness -MRA is normal -Carotid Doppler: noted No significant extracranial stenosis 2D Echo-showed preserved EF and grade 2 diastolic dysfunction -LDL 76, hemoglobin A1c is 5.9 consistent with prediabetes -Neurology consulted, recommended aspirin 81 mg and Plavix 75 mg daily for 3 weeks followed by Plavix alone -PT/OT/SLP evaluations completed, Outpatient PT recommended and set up -outpatient follow-up with Dr.Sethi in 1 month    History of bleeding gastric ulcer status post repair 2012 (NSAIDs) -continue Protonix twice a day     Anxiety -Continue clonazepam   prediabetes -lifestyle modification advised  Consultations:  Neurology  Discharge Exam: Vitals:   10/07/17 0742 10/07/17 1205  BP: 127/83 138/83  Pulse: 62 84  Resp: 18 18  Temp: 97.7 F (36.5 C) 98.2 F (36.8 C)  SpO2: 95% 96%    General:AAOx3 Cardiovascular: S1S2/RRR Respiratory: CTAB  Discharge Instructions   Discharge  Instructions    Ambulatory referral to Occupational Therapy   Complete by:  As directed    Ambulatory referral to Physical Therapy   Complete by:  As directed    Diet - low sodium heart healthy   Complete by:  As directed    Diet Carb Modified   Complete by:  As directed    Increase activity slowly   Complete by:  As directed      Allergies as of 10/07/2017      Reactions   Dilaudid [hydromorphone Hcl] Rash, Other (See Comments)   Shakey   Metoclopramide Hcl Hives, Swelling, Rash, Other (See Comments)   Swelling in lips   Nsaids    H/o bleeding gastric ulcers      Medication List    STOP taking these medications   HYDROcodone-acetaminophen 5-325 MG tablet Commonly known as:  NORCO/VICODIN   ondansetron 8 MG tablet Commonly known as:  ZOFRAN   zolpidem 12.5 MG CR tablet Commonly known as:  AMBIEN CR     TAKE these medications   aspirin 81 MG EC tablet Take 1 tablet (81 mg total) by mouth daily.   atorvastatin 40 MG tablet Commonly known as:  LIPITOR Take 1 tablet (40 mg total) by mouth daily at 6 PM.   clonazePAM 0.5 MG tablet Commonly known as:  KLONOPIN Take 0.5 mg by mouth at bedtime. What changed:  Another medication with the same name was removed. Continue taking this medication, and follow the directions you see here.   clopidogrel 75 MG tablet Commonly known as:  PLAVIX Take 1 tablet (75 mg total) by mouth daily for 21 days.   pantoprazole 40 MG tablet Commonly known  as:  PROTONIX Take 1 tablet (40 mg total) by mouth 2 (two) times daily.   UNABLE TO FIND Outpatient Physical and Occupational Therapy   Vitamin D (Ergocalciferol) 50000 units Caps capsule Commonly known as:  DRISDOL Take 1 capsule by mouth every 7 days. Take for 2 months. What changed:    how much to take  how to take this  when to take this  additional instructions      Allergies  Allergen Reactions  . Dilaudid [Hydromorphone Hcl] Rash and Other (See Comments)    Shakey   . Metoclopramide Hcl Hives, Swelling, Rash and Other (See Comments)    Swelling in lips  . Nsaids     H/o bleeding gastric ulcers   Follow-up Information    Bruce Passy, MD. Schedule an appointment as soon as possible for a visit in 1 week(s).   Specialty:  Family Medicine Contact information: [place] [address] [phone]        Bruce Fila, MD. Go in 4 week(s).   Specialties:  Neurology, Radiology Contact information: [place] [address] [place] [facility] up.   Specialty:  Rehabilitation Why:  They will contact you for the first visit.  Contact information: [address] [place] [id number] [place] [phone]           The results of significant diagnostics from this hospitalization (including imaging, microbiology, ancillary and laboratory) are listed below for reference.    Significant Diagnostic Studies: Ct Head Wo Contrast  Result Date: 10/05/2017 CLINICAL DATA:  47 y/o  M; 2 days of right-sided weakness. EXAM: CT HEAD WITHOUT CONTRAST TECHNIQUE: Contiguous axial images were obtained from the base of the skull through the vertex without intravenous contrast. COMPARISON:  None. FINDINGS: Brain: No evidence of acute infarction, hemorrhage, hydrocephalus, extra-axial collection or mass lesion/mass effect. Vascular: No hyperdense vessel or unexpected calcification. Skull: Normal. Negative for fracture or focal lesion. Sinuses/Orbits: No acute finding. Other: None. IMPRESSION: No acute intracranial abnormality identified. Unremarkable CT of the head. Electronically Signed   By: [name] M.D.   On: 10/05/2017 17:30   Mr [name] Head Wo Contrast  Result Date: 10/05/2017 CLINICAL DATA:  47 y/o M; right arm weakness, right arm tingling, and right leg tingling for 2 days. EXAM: MRI HEAD WITHOUT CONTRAST MRA HEAD  WITHOUT CONTRAST TECHNIQUE: Multiplanar, multiecho pulse sequences of the brain and surrounding structures were obtained without intravenous contrast. Angiographic images of the head were obtained using MRA technique without contrast. COMPARISON:  10/05/2017 CT head FINDINGS: MRI HEAD FINDINGS Brain: Probable punctate focus of reduced diffusion within the left thalamo-mesencephalic junction (series 3, image 24 and series 350, image 24). No additional area of reduced diffusion identified within the brain. No focal mass effect, hemorrhage, extra-axial collection, structural abnormality, or herniation. Vascular: As below. Skull and upper cervical spine: Normal marrow signal. Sinuses/Orbits: Negative. Other: None. MRA HEAD FINDINGS Internal carotid arteries:  Patent. Anterior cerebral arteries:  Patent. Middle cerebral arteries: Patent. Anterior communicating artery: Patent. Posterior communicating arteries: Not identified, likely hypoplastic or absent. Posterior cerebral arteries:  Patent. Basilar artery:  Patent. Vertebral arteries:  Patent. No evidence of high-grade stenosis, large vessel occlusion, or aneurysm. IMPRESSION: 1. Probable punctate focus of reduced diffusion within the left thalamo-mesencephalic junction. Findings are compatible with a small vessel acute/early subacute infarction. No additional findings for differential considerations of infectious/inflammatory demyelination  or vasculitis. 2. Normal MRA of the head. These results were called by telephone at the time of interpretation on 10/05/2017 at 11:06 pm to [place], who verbally acknowledged these results. Electronically Signed   By: [name] M.D.   On: 10/05/2017 23:12   Mr Brain Wo Contrast  Result Date: 10/05/2017 CLINICAL DATA:  47 y/o M; right arm weakness, right arm tingling, and right leg tingling for 2 days. EXAM: MRI HEAD WITHOUT CONTRAST MRA HEAD WITHOUT CONTRAST TECHNIQUE: Multiplanar, multiecho pulse sequences of the  brain and surrounding structures were obtained without intravenous contrast. Angiographic images of the head were obtained using MRA technique without contrast. COMPARISON:  10/05/2017 CT head FINDINGS: MRI HEAD FINDINGS Brain: Probable punctate focus of reduced diffusion within the left thalamo-mesencephalic junction (series 3, image 24 and series 350, image 24). No additional area of reduced diffusion identified within the brain. No focal mass effect, hemorrhage, extra-axial collection, structural abnormality, or herniation. Vascular: As below. Skull and upper cervical spine: Normal marrow signal. Sinuses/Orbits: Negative. Other: None. MRA HEAD FINDINGS Internal carotid arteries:  Patent. Anterior cerebral arteries:  Patent. Middle cerebral arteries: Patent. Anterior communicating artery: Patent. Posterior communicating arteries: Not identified, likely hypoplastic or absent. Posterior cerebral arteries:  Patent. Basilar artery:  Patent. Vertebral arteries:  Patent. No evidence of high-grade stenosis, large vessel occlusion, or aneurysm. IMPRESSION: 1. Probable punctate focus of reduced diffusion within the left thalamo-mesencephalic junction. Findings are compatible with a small vessel acute/early subacute infarction. No additional findings for differential considerations of infectious/inflammatory demyelination or vasculitis. 2. Normal MRA of the head. These results were called by telephone at the time of interpretation on 10/05/2017 at 11:06 pm to [place], who verbally acknowledged these results. Electronically Signed   By: [name] M.D.   On: 10/05/2017 23:12    Microbiology: No results found for this or any previous visit (from the past 240 hour(s)).   Labs: Basic Metabolic Panel: No results for input(s): NA, K, CL, CO2, GLUCOSE, BUN, CREATININE, CALCIUM, MG, PHOS in the last 168 hours. Liver Function Tests: No results for input(s): AST, ALT, ALKPHOS, BILITOT, PROT, ALBUMIN in the  last 168 hours. No results for input(s): LIPASE, AMYLASE in the last 168 hours. No results for input(s): AMMONIA in the last 168 hours. CBC: No results for input(s): WBC, NEUTROABS, HGB, HCT, MCV, PLT in the last 168 hours. Cardiac Enzymes: No results for input(s): CKTOTAL, CKMB, CKMBINDEX, TROPONINI in the last 168 hours. BNP: BNP (last 3 results) No results for input(s): BNP in the last 8760 hours.  ProBNP (last 3 results) No results for input(s): PROBNP in the last 8760 hours.  CBG: No results for input(s): GLUCAP in the last 168 hours.     Signed:  Domenic Polite MD.  [facility] 10/20/2017, 4:29 PM

## 2017-10-25 ENCOUNTER — Encounter: Payer: Self-pay | Admitting: Family Medicine

## 2017-10-27 MED FILL — traZODone HCL 50 MG TABS: 50 | 30 days supply | Qty: 30 | Fill #2

## 2017-10-27 MED FILL — ZALEPLON 10 MG CAPSULE: 10 | 30 days supply | Qty: 30 | Fill #2

## 2017-10-28 MED FILL — VIT D2 1.25 MG (50,000 UNIT: 1.25 MG | 42 days supply | Qty: 6 | Fill #0

## 2017-10-31 ENCOUNTER — Encounter: Payer: Self-pay | Admitting: Family Medicine

## 2017-11-02 ENCOUNTER — Encounter: Payer: Self-pay | Admitting: Family Medicine

## 2017-11-03 ENCOUNTER — Telehealth: Payer: Self-pay

## 2017-11-03 NOTE — Telephone Encounter (Signed)
PEC-I asked pt to call up here and sched an OV with someone for his headache/he recently had a Stroke and needs to see someone/if he calls back please schedule him with anyone that has openings in this office/thx dmf

## 2017-11-06 ENCOUNTER — Emergency Department (HOSPITAL_COMMUNITY)
Admission: EM | Admit: 2017-11-06 | Discharge: 2017-11-06 | Disposition: A | Discharge status: Home / Self Care | Payer: No Typology Code available for payment source | Attending: Emergency Medicine | Admitting: Emergency Medicine

## 2017-11-06 ENCOUNTER — Emergency Department (HOSPITAL_COMMUNITY): Payer: No Typology Code available for payment source

## 2017-11-06 ENCOUNTER — Other Ambulatory Visit: Payer: Self-pay

## 2017-11-06 DIAGNOSIS — Z96642 Presence of left artificial hip joint: Secondary | ICD-10-CM | POA: Insufficient documentation

## 2017-11-06 DIAGNOSIS — Z7982 Long term (current) use of aspirin: Secondary | ICD-10-CM | POA: Diagnosis not present

## 2017-11-06 DIAGNOSIS — Z79899 Other long term (current) drug therapy: Secondary | ICD-10-CM | POA: Insufficient documentation

## 2017-11-06 DIAGNOSIS — R4781 Slurred speech: Secondary | ICD-10-CM | POA: Insufficient documentation

## 2017-11-06 DIAGNOSIS — H538 Other visual disturbances: Secondary | ICD-10-CM | POA: Insufficient documentation

## 2017-11-06 DIAGNOSIS — R202 Paresthesia of skin: Secondary | ICD-10-CM | POA: Diagnosis not present

## 2017-11-06 DIAGNOSIS — F17220 Nicotine dependence, chewing tobacco, uncomplicated: Secondary | ICD-10-CM | POA: Insufficient documentation

## 2017-11-06 DIAGNOSIS — R200 Anesthesia of skin: Secondary | ICD-10-CM | POA: Diagnosis present

## 2017-11-06 DIAGNOSIS — R51 Headache: Secondary | ICD-10-CM | POA: Diagnosis not present

## 2017-11-06 DIAGNOSIS — R42 Dizziness and giddiness: Secondary | ICD-10-CM | POA: Diagnosis not present

## 2017-11-06 DIAGNOSIS — R531 Weakness: Secondary | ICD-10-CM | POA: Diagnosis not present

## 2017-11-06 LAB — PROTIME-INR
INR: 0.98
PROTHROMBIN TIME: 12.9 s (ref 11.4–15.2)

## 2017-11-06 LAB — DIFFERENTIAL
ABS IMMATURE GRANULOCYTES: 0 10*3/uL (ref 0.0–0.1)
Basophils Absolute: 0 10*3/uL (ref 0.0–0.1)
Basophils Relative: 1 %
Eosinophils Absolute: 0.3 10*3/uL (ref 0.0–0.7)
Eosinophils Relative: 4 %
IMMATURE GRANULOCYTES: 0 %
LYMPHS ABS: 2.1 10*3/uL (ref 0.7–4.0)
Lymphocytes Relative: 26 %
Monocytes Absolute: 0.8 10*3/uL (ref 0.1–1.0)
Monocytes Relative: 10 %
Neutro Abs: 4.8 10*3/uL (ref 1.7–7.7)
Neutrophils Relative %: 59 %

## 2017-11-06 LAB — I-STAT CHEM 8, ED
BUN: 13 mg/dL (ref 6–20)
Calcium, Ion: 1.13 mmol/L — ABNORMAL LOW (ref 1.15–1.40)
Chloride: 104 mmol/L (ref 98–111)
Creatinine, Ser: 1.1 mg/dL (ref 0.61–1.24)
Glucose, Bld: 102 mg/dL — ABNORMAL HIGH (ref 70–99)
HEMATOCRIT: 39 % (ref 39.0–52.0)
HEMOGLOBIN: 13.3 g/dL (ref 13.0–17.0)
POTASSIUM: 4.1 mmol/L (ref 3.5–5.1)
SODIUM: 138 mmol/L (ref 135–145)
TCO2: 27 mmol/L (ref 22–32)

## 2017-11-06 LAB — CBC
HEMATOCRIT: 41.5 % (ref 39.0–52.0)
Hemoglobin: 13.4 g/dL (ref 13.0–17.0)
MCH: 30.6 pg (ref 26.0–34.0)
MCHC: 32.3 g/dL (ref 30.0–36.0)
MCV: 94.7 fL (ref 78.0–100.0)
PLATELETS: 305 10*3/uL (ref 150–400)
RBC: 4.38 MIL/uL (ref 4.22–5.81)
RDW: 13 % (ref 11.5–15.5)
WBC: 8 10*3/uL (ref 4.0–10.5)

## 2017-11-06 LAB — I-STAT TROPONIN, ED: Troponin i, poc: 0 ng/mL (ref 0.00–0.08)

## 2017-11-06 LAB — COMPREHENSIVE METABOLIC PANEL
ALBUMIN: 4.2 g/dL (ref 3.5–5.0)
ALT: 37 U/L (ref 0–44)
AST: 33 U/L (ref 15–41)
Alkaline Phosphatase: 48 U/L (ref 38–126)
Anion gap: 9 (ref 5–15)
BILIRUBIN TOTAL: 0.9 mg/dL (ref 0.3–1.2)
BUN: 11 mg/dL (ref 6–20)
CHLORIDE: 105 mmol/L (ref 98–111)
CO2: 25 mmol/L (ref 22–32)
Calcium: 9 mg/dL (ref 8.9–10.3)
Creatinine, Ser: 1.09 mg/dL (ref 0.61–1.24)
GFR calc Af Amer: >60 mL/min (ref >60)
GFR calc non Af Amer: >60 mL/min (ref >60)
GLUCOSE: 96 mg/dL (ref 70–99)
Potassium: 4.2 mmol/L (ref 3.5–5.1)
SODIUM: 139 mmol/L (ref 135–145)
TOTAL PROTEIN: 6.6 g/dL (ref 6.5–8.1)

## 2017-11-06 LAB — CBG MONITORING, ED: Glucose-Capillary: 92 mg/dL (ref 70–99)

## 2017-11-06 LAB — APTT: aPTT: 30 seconds (ref 24–36)

## 2017-11-06 MED ORDER — LORAZEPAM 2 MG/ML IJ SOLN
1.0000 mg | Freq: Once | INTRAMUSCULAR | Status: AC
  Administered 2017-11-06: 1 mg via INTRAVENOUS
  Filled 2017-11-06: qty 1

## 2017-11-06 MED ORDER — DIPHENHYDRAMINE HCL 50 MG/ML IJ SOLN
25.0000 mg | Freq: Once | INTRAMUSCULAR | Status: AC
  Administered 2017-11-06: 25 mg via INTRAVENOUS
  Filled 2017-11-06: qty 1

## 2017-11-06 MED ORDER — SODIUM CHLORIDE 0.9 % IV BOLUS
1000.0000 mL | Freq: Once | INTRAVENOUS | Status: AC
  Administered 2017-11-06: 1000 mL via INTRAVENOUS

## 2017-11-06 MED ORDER — CLOPIDOGREL BISULFATE 75 MG PO TABS: 75.0000 mg | ORAL_TABLET | Freq: Every day | ORAL | 0 refills | Status: DC

## 2017-11-06 MED ORDER — PROCHLORPERAZINE EDISYLATE 10 MG/2ML IJ SOLN
10.0000 mg | Freq: Once | INTRAMUSCULAR | Status: AC
  Administered 2017-11-06: 10 mg via INTRAVENOUS
  Filled 2017-11-06: qty 2

## 2017-11-06 NOTE — ED Provider Notes (Signed)
[facility] EMERGENCY DEPARTMENT Provider Note   CSN: [id number] Arrival date & time: 11/06/17  1243     History   Chief Complaint Chief Complaint  Patient presents with  . Stroke Symptoms    HPI [name] is a 47 y.o. male with history of anxiety, gastric ulcers, erosive esophagitis, GERD, hiatal hernia, prediabetes presents for evaluation of worsening right-sided headache.  Recently admitted for stroke work-up on 10/05/2017 with MRI findings of likely acute/early subacute left thalamic mesencephalic junction infarct.  He was started on Plavix and aspirin but states he ran out of his Plavix a few days ago.  He notes a constant right-sided headache since then but over the past 2 to 3 days the headache worsened.  He describes it as a pressure-like sensation which radiates from the right temple down the right side of the face.  Also notes numbness and tingling of this region as well as worsening weakness/numbness of the right upper extremity.  He also notes blurred peripheral vision in the right eye which is new.  He endorses lightheadedness upon standing but denies dizziness.  His brother states that his slurred speech has worsened.  No fevers, chest pain, shortness of breath, melena, hematochezia, or abdominal pain.  The history is provided by the patient and a relative.    Past Medical History:  Diagnosis Date  . Anxiety    takes Xanax daily prn  . Chronic gastric ulcer with bleeding s/p suture repair 2012 09/29/2010   Head 3 EGD which failed to stop the bleeding. Had exploratory laparotomy on 10/04/2010. Possible cause of the gastric ulcer was NSAID use.   . Constipation    related to pain meds  . Dizziness    occasionally  . Erosive esophagitis   . External hemorrhoid, bleeding   . Gastric ulcer    history of  . GERD (gastroesophageal reflux disease)    takes Protonix bid  . Hiatal hernia   . History of blood clots 2012   in abdomen  . History of blood  transfusion 2012  . Incisional hernia s/p open repair w mesh Aug 2013 05/26/2011  . Joint pain    knees  . Nocturia    depends on amount of fluid he drinks  . Pneumonia   . Pre-diabetes   . Primary localized osteoarthritis of left hip 04/24/2014  . Serrated polyp of colon   . Sleep apnea    no cpap  mild no cpap needed  . Upper GI bleed   . Ventral hernia     Patient Active Problem List   Diagnosis Date Noted  . Right sided weakness   . Vertigo   . Stroke ([place]) 10/05/2017  . Leukocytosis 10/05/2017  . Vitamin D deficiency 08/25/2017  . Hyperkalemia 08/25/2017  . Visit for well man health check 04/14/2017  . Fatigue 11/23/2016  . Gastroesophageal reflux disease   . Recurrent hiatal hernia s/p redo robotic PEH/Nissen repair 01/22/2017   . Elevated blood pressure reading 12/12/2015  . Difficulty concentrating 06/20/2015  . Insomnia 05/30/2014  . Primary localized osteoarthritis of left hip 04/24/2014  . Elevated hemoglobin A1c 11/17/2012  . Erectile dysfunction 08/08/2012  . History of bleeding gastric ulcer status post repair 2012 (NSAIDs) 05/26/2011  . Anxiety 05/26/2011  . DJD (degenerative joint disease) 10/14/2010    Past Surgical History:  Procedure Laterality Date  . COLONOSCOPY    . ESOPHAGEAL MANOMETRY N/A 10/19/2016   Procedure: ESOPHAGEAL MANOMETRY (EM);  Surgeon: Silverio Decamp,  Venia Minks, MD;  Location: [facility];  Service: Endoscopy;  Laterality: N/A;  . ESOPHAGOGASTRODUODENOSCOPY    . ESOPHAGOGASTRODUODENOSCOPY N/A 01/22/2017   Procedure: ESOPHAGOGASTRODUODENOSCOPY (EGD);  Surgeon: [name], MD;  Location: [facility];  Service: General;  Laterality: N/A;  . INCISIONAL HERNIA REPAIR  09/04/2011   Procedure: HERNIA REPAIR INCISIONAL;  Surgeon: Madilyn Hook, DO;  Location: [facility];  Service: General;  Laterality: N/A;  debridment calcified mass  . INSERTION OF MESH  09/04/2011   retrorectus ultrapro "30x30cm"  . INSERTION OF MESH N/A 01/22/2017   Procedure:  INSERTION OF MESH;  Surgeon: [name], MD;  Location: [facility];  Service: General;  Laterality: N/A;  . NISSEN FUNDOPLICATION     Open [id number]  . POLYPECTOMY    . STOMACH SURGERY  10/05/2010   Oversewing of gastric ulcer.  Dr Brantley Stage  . TOTAL HIP ARTHROPLASTY Left 04/24/2014   Procedure: LEFT TOTAL HIP ARTHROPLASTY;  Surgeon: Marchia Bond, MD;  Location: [facility];  Service: Orthopedics;  Laterality: Left;  . UPPER GASTROINTESTINAL ENDOSCOPY          Home Medications    Prior to Admission medications   Medication Sig Start Date End Date Taking? Authorizing Provider  acetaminophen (TYLENOL) 500 MG tablet Take 1,000 mg by mouth every 6 (six) hours as needed for mild pain or headache.   Yes [provider]  aspirin EC 81 MG EC tablet Take 1 tablet (81 mg total) by mouth daily. 10/08/17  Yes Domenic Polite, MD  atorvastatin (LIPITOR) 40 MG tablet Take 1 tablet (40 mg total) by mouth daily at 6 PM. 10/07/17  Yes Domenic Polite, MD  clonazePAM (KLONOPIN) 0.5 MG tablet Take 0.5 mg by mouth at bedtime. 09/06/17  Yes [provider]  pantoprazole (PROTONIX) 40 MG tablet Take 1 tablet (40 mg total) by mouth 2 (two) times daily. 06/15/17  Yes [name], MD  traZODone (DESYREL) 50 MG tablet Take 50 mg by mouth daily as needed. 10/27/17  Yes [provider]  Vitamin D, Ergocalciferol, (DRISDOL) 50000 units CAPS capsule Take 1 capsule by mouth every 7 days. Take for 2 months. Patient taking differently: Take 50,000 Units by mouth every 7 (seven) days. On Monday 10/01/17  Yes [name], MD  zaleplon (SONATA) 10 MG capsule Take 10 mg by mouth daily as needed. 10/27/17  Yes [provider]  zolpidem (AMBIEN CR) 12.5 MG CR tablet Take 1 tablet (12.5 mg total) by mouth at bedtime as needed for sleep. 10/13/17  Yes [name], MD  clopidogrel (PLAVIX) 75 MG tablet Take 1 tablet (75 mg total) by mouth daily. 11/06/17   Renita Papa, PA-C  UNABLE TO FIND Outpatient Physical  and Occupational Therapy Patient not taking: Reported on 11/06/2017 10/07/17   Domenic Polite, MD    Family History Family History  Problem Relation Age of Onset  . Diabetes Mother   . Liver disease Mother   . Diabetes Father   . Heart disease Father   . Colon cancer Neg Hx   . Stomach cancer Neg Hx   . Rectal cancer Neg Hx   . Esophageal cancer Neg Hx   . Colon polyps Neg Hx     Social History Social History   Tobacco Use  . Smoking status: Never Smoker  . Smokeless tobacco: Current User    Types: Chew  . Tobacco comment: occ-Chew  Substance Use Topics  . Alcohol use: Yes    Alcohol/week: 0.0 standard  drinks    Comment: 09/04/11 "very seldom"  . Drug use: No     Allergies   Dilaudid [hydromorphone hcl]; Metoclopramide hcl; and Nsaids   Review of Systems Review of Systems  Constitutional: Negative for chills and fever.  Eyes: Positive for photophobia and visual disturbance.  Respiratory: Negative for shortness of breath.   Cardiovascular: Negative for chest pain.  Gastrointestinal: Negative for abdominal pain, nausea and vomiting.  Musculoskeletal: Negative for gait problem.  Neurological: Positive for speech difficulty, weakness, light-headedness, numbness and headaches. Negative for seizures and syncope.  All other systems reviewed and are negative.    Physical Exam Updated Vital Signs BP 130/79   Pulse 77   Temp 98.6 F (37 C) (Oral)   Resp (!) 23   Ht 5' 9.5" (1.765 m)   Wt 90.7 kg   SpO2 95%   BMI 29.11 kg/m   Physical Exam  Constitutional: He is oriented to person, place, and time. He appears well-developed and well-nourished. No distress.  HENT:  Head: Normocephalic and atraumatic.  No tenderness to palpation of the face or skull  Eyes: Conjunctivae are normal. Right eye exhibits no discharge. Left eye exhibits no discharge.  Some difficulty with EOMs, no pain  Neck: Normal range of motion. Neck supple. No JVD present. No tracheal deviation  present.  Cardiovascular: Normal rate, regular rhythm, normal heart sounds and intact distal pulses.  Pulmonary/Chest: Effort normal and breath sounds normal.  Abdominal: Soft. Bowel sounds are normal. He exhibits no distension. There is no tenderness. There is no guarding.  Musculoskeletal: He exhibits no edema.  Neurological: He is alert and oriented to person, place, and time. A sensory deficit is present. No cranial nerve deficit. He exhibits normal muscle tone.  Fluent speech with no evidence of dysarthria or aphasia, no facial droop.  Subjectively decreased sensation to soft touch of the right face.  4+/5 strength of right upper extremity major muscle groups as compared to the left, good grip strength bilaterally.  Mild right sided pronator drift noted.  Romberg sign absent.  5/5 strength of BLE major muscle groups.  No dysmetria with finger-to-nose of the bilateral upper extremities.  Blurred vision in the right temporal field.  Skin: Skin is warm and dry. No erythema.  Psychiatric: He has a normal mood and affect. His behavior is normal.  Nursing note and vitals reviewed.    ED Treatments / Results  Labs (all labs ordered are listed, but only abnormal results are displayed) Labs Reviewed  I-STAT CHEM 8, ED - Abnormal; Notable for the following components:      Result Value   Glucose, Bld 102 (*)    Calcium, Ion 1.13 (*)    All other components within normal limits  PROTIME-INR  APTT  CBC  DIFFERENTIAL  COMPREHENSIVE METABOLIC PANEL  I-STAT TROPONIN, ED  CBG MONITORING, ED    EKG None  Radiology Ct Head Wo Contrast  Result Date: 11/06/2017 CLINICAL DATA:  stroke 4 weeks ago and was left with right arm numbness and weakness. Then over the last couple of days he has a consistent headache on right side with continuous pressure down right face. Pt has no facial deficits seen, does have right arm weakness, no drift. Reports right eye blurry. Pt is on plavix EXAM: CT HEAD  WITHOUT CONTRAST TECHNIQUE: Contiguous axial images were obtained from the base of the skull through the vertex without intravenous contrast. COMPARISON:  10/05/2017 FINDINGS: Brain: No evidence of acute infarction, hemorrhage, hydrocephalus, extra-axial  collection or mass lesion/mass effect. Vascular: No hyperdense vessel or unexpected calcification. Skull: Normal. Negative for fracture or focal lesion. Sinuses/Orbits: No acute finding. Other: None. IMPRESSION: Negative Electronically Signed   By: Lucrezia Europe M.D.   On: 11/06/2017 14:19   Mr Brain Wo Contrast (neuro Protocol)  Result Date: 11/06/2017 CLINICAL DATA:  Right-sided headache with right facial pressure. Right arm weakness. Right eye blurry vision. Recent stroke. EXAM: MRI HEAD WITHOUT CONTRAST TECHNIQUE: Multiplanar, multiecho pulse sequences of the brain and surrounding structures were obtained without intravenous contrast. COMPARISON:  Head CT 11/06/2017 and MRI 10/05/2017 FINDINGS: Brain: Punctate focus of suspected restricted diffusion at the left thalamo-mesencephalic junction on the prior MRI is no longer apparent. There is no evidence of acute infarct, intracranial hemorrhage, mass, midline shift, or extra-axial fluid collection. The ventricles and sulci are normal. The brain is normal in signal. Vascular: Major intracranial vascular flow voids are preserved. Skull and upper cervical spine: Unremarkable bone marrow signal. Sinuses/Orbits: Unremarkable orbits. Paranasal sinuses and mastoid air cells are clear. Other: None. IMPRESSION: Negative brain MRI. Electronically Signed   By: Logan Bores M.D.   On: 11/06/2017 19:12    Procedures Procedures (including critical care time)  Medications Ordered in ED Medications  sodium chloride 0.9 % bolus 1,000 mL (0 mLs Intravenous Stopped 11/06/17 1732)  prochlorperazine (COMPAZINE) injection 10 mg (10 mg Intravenous Given 11/06/17 1600)  diphenhydrAMINE (BENADRYL) injection 25 mg (25 mg  Intravenous Given 11/06/17 1600)  LORazepam (ATIVAN) injection 1 mg (1 mg Intravenous Given 11/06/17 1800)     Initial Impression / Assessment and Plan / ED Course  I have reviewed the triage vital signs and the nursing notes.  Pertinent labs & imaging results that were available during my care of the patient were reviewed by me and considered in my medical decision making (see chart for details).     Patient with worsening of right-sided headache and right upper extremity numbness/weakness as well as right peripheral blurred vision for 3 days.  He is afebrile, vital signs are stable.  He is nontoxic in appearance.  He has mild weakness of the right upper extremity as compared to the left subjective decreased sensation to soft touch of the right side of the face.  Lab work reviewed by me overall unremarkable with no leukocytosis, no anemia, no metabolic derangements.  CT head shows no acute intracranial abnormalities.  Spoke with Dr. Leonel Ramsay with neurology who recommends obtaining MRI but if no acute changes stable for discharge home with continuation of Plavix.  MRI shows resolution of the left thalamic mesencephalic junction stroke, no acute changes.  No evidence of meningitis, ICH, SAH, or CVA.  Patient was given migraine cocktail in the ED with significant improvement in his headache.  Stable for discharge home, will refill his Plavix to get him to his appointment with his PCP on Thursday.  He will follow-up with neurology on an outpatient basis.  Discussed strict ED return precautions. Pt verbalized understanding of and agreement with plan and is safe for discharge home at this time.   Final Clinical Impressions(s) / ED Diagnoses   Final diagnoses:  Right facial numbness  Blurry vision, right eye    ED Discharge Orders         Ordered    clopidogrel (PLAVIX) 75 MG tablet  Daily     11/06/17 1951           Debroah Baller 11/06/17 2028    Valarie Merino, MD  11/06/17  2234  

## 2017-11-06 NOTE — ED Notes (Signed)
Patient transported to MRI 

## 2017-11-06 NOTE — ED Triage Notes (Signed)
Pt had a stroke 4 weeks ago and was left with right arm numbness and weakness.  Then over the last couple of days he has a consistent headache on right side with continuous pressure down right face.  Pt has no facial deficits seen, does have right arm weakness, no drift.  Reports right eye blurry.  Pt is on plavix

## 2017-11-06 NOTE — ED Notes (Signed)
Patient verbalizes understanding of discharge instructions. Opportunity for questioning and answers were provided. Armband removed by staff, pt discharged from ED.  

## 2017-11-06 NOTE — Discharge Instructions (Addendum)
Take your Plavix daily as prescribed.  Take the cholesterol medicine as well.  Do not take baby aspirin.  Follow-up with your PCP on Thursday as scheduled.  Follow-up with neurology as soon as possible for reevaluation of your symptoms.  Return to the emergency department if any concerning signs or symptoms develop such as fevers, weakness, slurred speech, passing out difficulty walking or talking to

## 2017-11-06 NOTE — ED Notes (Signed)
Patient transported to CT 

## 2017-11-08 MED FILL — CLOPIDOGREL 75 MG TABLET: 75 | 6 days supply | Qty: 6 | Fill #0

## 2017-11-11 ENCOUNTER — Ambulatory Visit: Payer: No Typology Code available for payment source | Admitting: Family Medicine

## 2017-11-11 ENCOUNTER — Ambulatory Visit (INDEPENDENT_AMBULATORY_CARE_PROVIDER_SITE_OTHER): Payer: No Typology Code available for payment source | Admitting: Family Medicine

## 2017-11-11 VITALS — BP 132/88 | HR 84 | Temp 98.6°F | Ht 66.0 in | Wt 206.4 lb

## 2017-11-11 DIAGNOSIS — G441 Vascular headache, not elsewhere classified: Secondary | ICD-10-CM

## 2017-11-11 DIAGNOSIS — Z09 Encounter for follow-up examination after completed treatment for conditions other than malignant neoplasm: Secondary | ICD-10-CM | POA: Insufficient documentation

## 2017-11-11 DIAGNOSIS — R51 Headache: Secondary | ICD-10-CM

## 2017-11-11 DIAGNOSIS — I6300 Cerebral infarction due to thrombosis of unspecified precerebral artery: Secondary | ICD-10-CM | POA: Diagnosis not present

## 2017-11-11 DIAGNOSIS — R519 Headache, unspecified: Secondary | ICD-10-CM | POA: Insufficient documentation

## 2017-11-11 MED ORDER — CLONAZEPAM 0.5 MG PO TABS: 0.5000 mg | ORAL_TABLET | Freq: Every day | ORAL | 2 refills | Status: DC

## 2017-11-11 MED ORDER — CLOPIDOGREL BISULFATE 75 MG PO TABS: 75.0000 mg | ORAL_TABLET | Freq: Every day | ORAL | 1 refills | Status: DC

## 2017-11-11 MED ORDER — ONDANSETRON HCL 4 MG PO TABS: 4.0000 mg | ORAL_TABLET | Freq: Three times a day (TID) | ORAL | 0 refills | Status: DC | PRN

## 2017-11-11 MED ORDER — ATORVASTATIN CALCIUM 40 MG PO TABS: 40.0000 mg | ORAL_TABLET | Freq: Every day | ORAL | 2 refills | Status: DC

## 2017-11-11 MED FILL — ONDANSETRON HCL 4 MG TABLET: 4 | 6 days supply | Qty: 20 | Fill #0

## 2017-11-11 MED FILL — ATORVASTATIN 40 MG TABLET: 40 | 30 days supply | Qty: 30 | Fill #0

## 2017-11-11 MED FILL — ZOLPIDEM TART ER 12.5 MG TA: 12.5 | 30 days supply | Qty: 30 | Fill #0

## 2017-11-11 MED FILL — clonazePAM 0.5 MG TABS: 0.5 | 30 days supply | Qty: 30 | Fill #0

## 2017-11-11 NOTE — Assessment & Plan Note (Addendum)
>  25 minutes spent in face to face time with patient, >50% spent in counselling or coordination of care discussing CVA, stroke, CT and MRI results. Discussed importance of taking plavix and ASA daily- follow up with [name].  Referral placed. The patient indicates understanding of these issues and agrees with the plan.

## 2017-11-11 NOTE — Patient Instructions (Signed)
Great to see you.  We are referring you to Dr .[name].  If you haven't heard from him this week or early next week, please call.  Make sure you take your plavix,

## 2017-11-11 NOTE — Progress Notes (Signed)
Subjective:   Patient ID: [name], male    DOB: 12/21/1970, 47 y.o.   MRN: [id number]  [name] is a pleasant 47 y.o. year old male who presents to clinic today with Hospitalization Follow-up (Patient is here today for a Hosptial F/U.  He was seen at [facility] on 10.05.2019 C/O Stroke Sx again.  This appt was scheduled due to persistent headaches.  He states that the H/A's are not as bad.  The hospital thought that the reason for his headache Sx were because he was without his blood thinner for a day.  They gave him 6d worth and scanned him and saw that clot is still there.  He is requesting a refill and a referral to [name] in Neuro.  )  on 11/11/2017  HPI:  Here for ER follow up.  Presented to the ER on 11/06/17 with right sided headache.  Admitted on 10/05/17- MRI showed acute/subacute stroke.Was started on ASA and Plavix but ran out of his plavix a few days before he presented to the ER on 11/06/17.  That was when his right sided headache similar to headache he had with his stroke started.  Also noticed right sided numbness and tingling in his face with visual changes.  Ct Head Wo Contrast  Result Date: 11/06/2017 CLINICAL DATA:  stroke 4 weeks ago and was left with right arm numbness and weakness. Then over the last couple of days he has a consistent headache on right side with continuous pressure down right face. Pt has no facial deficits seen, does have right arm weakness, no drift. Reports right eye blurry. Pt is on plavix EXAM: CT HEAD WITHOUT CONTRAST TECHNIQUE: Contiguous axial images were obtained from the base of the skull through the vertex without intravenous contrast. COMPARISON:  10/05/2017 FINDINGS: Brain: No evidence of acute infarction, hemorrhage, hydrocephalus, extra-axial collection or mass lesion/mass effect. Vascular: No hyperdense vessel or unexpected calcification. Skull: Normal. Negative for fracture or focal lesion. Sinuses/Orbits: No acute finding. Other: None.  IMPRESSION: Negative Electronically Signed   By: Lucrezia Europe M.D.   On: 11/06/2017 14:19   Mr Brain Wo Contrast (neuro Protocol)  Result Date: 11/06/2017 CLINICAL DATA:  Right-sided headache with right facial pressure. Right arm weakness. Right eye blurry vision. Recent stroke. EXAM: MRI HEAD WITHOUT CONTRAST TECHNIQUE: Multiplanar, multiecho pulse sequences of the brain and surrounding structures were obtained without intravenous contrast. COMPARISON:  Head CT 11/06/2017 and MRI 10/05/2017 FINDINGS: Brain: Punctate focus of suspected restricted diffusion at the left thalamo-mesencephalic junction on the prior MRI is no longer apparent. There is no evidence of acute infarct, intracranial hemorrhage, mass, midline shift, or extra-axial fluid collection. The ventricles and sulci are normal. The brain is normal in signal. Vascular: Major intracranial vascular flow voids are preserved. Skull and upper cervical spine: Unremarkable bone marrow signal. Sinuses/Orbits: Unremarkable orbits. Paranasal sinuses and mastoid air cells are clear. Other: None. IMPRESSION: Negative brain MRI. Electronically Signed   By: Logan Bores M.D.   On: 11/06/2017 19:12   Head CT showed no acute abnormalities.  MRI was neg. Dr. Leonel Ramsay from neurology was consulted he advised that he restart his Plavix.  Since restarting his plavix, his headache has resolved.  No other focal neurological deficits.   He is asking for referral to see [name].   Current Outpatient Medications on File Prior to Visit  Medication Sig Dispense Refill  . acetaminophen (TYLENOL) 500 MG tablet Take 1,000 mg by mouth every 6 (six)  hours as needed for mild pain or headache.    Marland Kitchen aspirin EC 81 MG EC tablet Take 1 tablet (81 mg total) by mouth daily.    . pantoprazole (PROTONIX) 40 MG tablet Take 1 tablet (40 mg total) by mouth 2 (two) times daily. 180 tablet 2  . traZODone (DESYREL) 50 MG tablet Take 50 mg by mouth daily as needed.  3  . Vitamin D,  Ergocalciferol, (DRISDOL) 50000 units CAPS capsule Take 1 capsule by mouth every 7 days. Take for 2 months. (Patient taking differently: Take 50,000 Units by mouth every 7 (seven) days. On Monday) 4 capsule 0  . zaleplon (SONATA) 10 MG capsule Take 10 mg by mouth daily as needed.  2  . zolpidem (AMBIEN CR) 12.5 MG CR tablet Take 1 tablet (12.5 mg total) by mouth at bedtime as needed for sleep. 30 tablet 1   No current facility-administered medications on file prior to visit.     Allergies  Allergen Reactions  . Dilaudid [Hydromorphone Hcl] Rash and Other (See Comments)    Shakey  . Metoclopramide Hcl Hives, Swelling, Rash and Other (See Comments)    Swelling in lips  . Nsaids     H/o bleeding gastric ulcers  . Benadryl [Diphenhydramine]     Causes patient to become hyper and pace    Past Medical History:  Diagnosis Date  . Anxiety    takes Xanax daily prn  . Chronic gastric ulcer with bleeding s/p suture repair 2012 09/29/2010   Head 3 EGD which failed to stop the bleeding. Had exploratory laparotomy on 10/04/2010. Possible cause of the gastric ulcer was NSAID use.   . Constipation    related to pain meds  . Dizziness    occasionally  . Erosive esophagitis   . External hemorrhoid, bleeding   . Gastric ulcer    history of  . GERD (gastroesophageal reflux disease)    takes Protonix bid  . Hiatal hernia   . History of blood clots 2012   in abdomen  . History of blood transfusion 2012  . Incisional hernia s/p open repair w mesh Aug 2013 05/26/2011  . Joint pain    knees  . Nocturia    depends on amount of fluid he drinks  . Pneumonia   . Pre-diabetes   . Primary localized osteoarthritis of left hip 04/24/2014  . Serrated polyp of colon   . Sleep apnea    no cpap  mild no cpap needed  . Upper GI bleed   . Ventral hernia     Past Surgical History:  Procedure Laterality Date  . COLONOSCOPY    . ESOPHAGEAL MANOMETRY N/A 10/19/2016   Procedure: ESOPHAGEAL MANOMETRY (EM);   Surgeon: Mauri Pole, MD;  Location: [facility];  Service: Endoscopy;  Laterality: N/A;  . ESOPHAGOGASTRODUODENOSCOPY    . ESOPHAGOGASTRODUODENOSCOPY N/A 01/22/2017   Procedure: ESOPHAGOGASTRODUODENOSCOPY (EGD);  Surgeon: [name], MD;  Location: [facility];  Service: General;  Laterality: N/A;  . INCISIONAL HERNIA REPAIR  09/04/2011   Procedure: HERNIA REPAIR INCISIONAL;  Surgeon: Madilyn Hook, DO;  Location: [facility];  Service: General;  Laterality: N/A;  debridment calcified mass  . INSERTION OF MESH  09/04/2011   retrorectus ultrapro "30x30cm"  . INSERTION OF MESH N/A 01/22/2017   Procedure: INSERTION OF MESH;  Surgeon: [name], MD;  Location: [facility];  Service: General;  Laterality: N/A;  . NISSEN FUNDOPLICATION     Open 2409B  . POLYPECTOMY    .  STOMACH SURGERY  10/05/2010   Oversewing of gastric ulcer.  Dr Brantley Stage  . TOTAL HIP ARTHROPLASTY Left 04/24/2014   Procedure: LEFT TOTAL HIP ARTHROPLASTY;  Surgeon: Marchia Bond, MD;  Location: [facility];  Service: Orthopedics;  Laterality: Left;  . UPPER GASTROINTESTINAL ENDOSCOPY      Family History  Problem Relation Age of Onset  . Diabetes Mother   . Liver disease Mother   . Diabetes Father   . Heart disease Father   . Colon cancer Neg Hx   . Stomach cancer Neg Hx   . Rectal cancer Neg Hx   . Esophageal cancer Neg Hx   . Colon polyps Neg Hx     Social History   Socioeconomic History  . Marital status: Single    Spouse name: Not on file  . Number of children: 1  . Years of education: Not on file  . Highest education level: Not on file  Occupational History  . Occupation: Maintenance - [employer]  Social Needs  . Financial resource strain: Not on file  . Food insecurity:    Worry: Not on file    Inability: Not on file  . Transportation needs:    Medical: Not on file    Non-medical: Not on file  Tobacco Use  . Smoking status: Never Smoker  . Smokeless tobacco: Current User    Types: Chew  . Tobacco comment:  occ-Chew  Substance and Sexual Activity  . Alcohol use: Yes    Alcohol/week: 0.0 standard drinks    Comment: 09/04/11 "very seldom"  . Drug use: No  . Sexual activity: Yes  Lifestyle  . Physical activity:    Days per week: Not on file    Minutes per session: Not on file  . Stress: Not on file  Relationships  . Social connections:    Talks on phone: Not on file    Gets together: Not on file    Attends religious service: Not on file    Active member of club or organization: Not on file    Attends meetings of clubs or organizations: Not on file    Relationship status: Not on file  . Intimate partner violence:    Fear of current or ex partner: Not on file    Emotionally abused: Not on file    Physically abused: Not on file    Forced sexual activity: Not on file  Other Topics Concern  . Not on file  Social History Narrative  . Not on file   The PMH, PSH, Social History, Family History, Medications, and allergies have been reviewed in [facility], and have been updated if relevant.   Review of Systems  Constitutional: Negative.   HENT: Negative.   Respiratory: Negative.   Cardiovascular: Negative.   Gastrointestinal: Negative.   Endocrine: Negative.   Genitourinary: Negative.   Musculoskeletal: Negative.   Allergic/Immunologic: Negative.   Neurological: Negative.   Hematological: Negative.   Psychiatric/Behavioral: Negative.   All other systems reviewed and are negative.      Objective:    BP 132/88 (BP Location: Left Arm, Patient Position: Sitting, Cuff Size: Normal)   Pulse 84   Temp 98.6 F (37 C) (Oral)   Ht 5\' 6"  (1.676 m)   Wt 206 lb 6.4 oz (93.6 kg)   SpO2 97%   BMI 33.31 kg/m    Physical Exam  Constitutional: He is oriented to person, place, and time. He appears well-developed and well-nourished. No distress.  HENT:  Head:  Normocephalic and atraumatic.  Eyes: EOM are normal.  Neck: Normal range of motion.  Cardiovascular: Normal rate.  Pulmonary/Chest:  Effort normal.  Musculoskeletal: Normal range of motion.  Neurological: He is alert and oriented to person, place, and time. No cranial nerve deficit or sensory deficit. He exhibits normal muscle tone. Coordination normal.  Skin: Skin is warm and dry. He is not diaphoretic.  Psychiatric: He has a normal mood and affect. His behavior is normal. Judgment and thought content normal.  Nursing note and vitals reviewed.         Assessment & Plan:   Hospital discharge follow-up  Other vascular headache  Cerebrovascular accident (CVA) due to thrombosis of precerebral artery ([place]) - Plan: Ambulatory referral to Neurology No follow-ups on file.

## 2017-11-12 ENCOUNTER — Encounter: Payer: Self-pay | Admitting: Neurology

## 2017-11-12 ENCOUNTER — Ambulatory Visit: Payer: No Typology Code available for payment source | Admitting: Neurology

## 2017-11-12 ENCOUNTER — Other Ambulatory Visit (INDEPENDENT_AMBULATORY_CARE_PROVIDER_SITE_OTHER): Payer: No Typology Code available for payment source

## 2017-11-12 VITALS — BP 140/80 | HR 92 | Ht 66.0 in | Wt 204.5 lb

## 2017-11-12 DIAGNOSIS — R519 Headache, unspecified: Secondary | ICD-10-CM

## 2017-11-12 DIAGNOSIS — I69331 Monoplegia of upper limb following cerebral infarction affecting right dominant side: Secondary | ICD-10-CM

## 2017-11-12 DIAGNOSIS — I639 Cerebral infarction, unspecified: Secondary | ICD-10-CM | POA: Diagnosis not present

## 2017-11-12 DIAGNOSIS — R51 Headache: Secondary | ICD-10-CM | POA: Diagnosis not present

## 2017-11-12 DIAGNOSIS — R29898 Other symptoms and signs involving the musculoskeletal system: Secondary | ICD-10-CM | POA: Diagnosis not present

## 2017-11-12 LAB — LIPID PANEL
CHOL/HDL RATIO: 3
Cholesterol: 153 mg/dL (ref 0–200)
HDL: 59.2 mg/dL (ref >39.00)
LDL Cholesterol: 71 mg/dL (ref 0–99)
NonHDL: 93.92
Triglycerides: 117 mg/dL (ref 0.0–149.0)
VLDL: 23.4 mg/dL (ref 0.0–40.0)

## 2017-11-12 MED ORDER — NORTRIPTYLINE HCL 10 MG PO CAPS: 10.0000 mg | ORAL_CAPSULE | Freq: Every day | ORAL | 3 refills | Status: DC

## 2017-11-12 MED FILL — CLOPIDOGREL 75 MG TABLET: 75 | 30 days supply | Qty: 30 | Fill #0

## 2017-11-12 MED FILL — NORTRIPTYLINE HCL 10 MG CAP: 10 | 30 days supply | Qty: 30 | Fill #0

## 2017-11-12 NOTE — Addendum Note (Signed)
Addended by: Chester Holstein on: 11/12/2017 03:43 PM   Modules accepted: Orders

## 2017-11-12 NOTE — Patient Instructions (Addendum)
1.  Stop Plavix.  Instead, take aspirin EC 81mg  daily for secondary stroke prevention 2.  Continue atorvastatin 40mg  daily.  We will recheck lipid panel in 3 months (prior to follow up) 3.  For headache prophylaxis, will start nortriptyline 10mg  at bedtime.  If headaches not improved in 4 weeks, contact me and we can increase dose. 4.  Take Tylenol as needed for headache but limit use to no more than 2 days out of week to prevent risk of rebound or medication-overuse headache. 5.  Keep headache diary 6.  Increase exercise.  Follow Mediterranean diet (see below) 7.  Blood pressure borderline elevated.  Follow up with Dr. Deborra Medina 8.  Follow up in 3 to 4 months.   Mediterranean Diet A Mediterranean diet refers to food and lifestyle choices that are based on the traditions of countries located on the [employer]. This way of eating has been shown to help prevent certain conditions and improve outcomes for people who have chronic diseases, like kidney disease and heart disease. What are tips for following this plan? Lifestyle  Cook and eat meals together with your family, when possible.  Drink enough fluid to keep your urine clear or pale yellow.  Be physically active every day. This includes: ? Aerobic exercise like running or swimming. ? Leisure activities like gardening, walking, or housework.  Get 7-8 hours of sleep each night.  If recommended by your health care provider, drink red wine in moderation. This means 1 glass a day for nonpregnant women and 2 glasses a day for men. A glass of wine equals 5 oz (150 mL). Reading food labels  Check the serving size of packaged foods. For foods such as rice and pasta, the serving size refers to the amount of cooked product, not dry.  Check the total fat in packaged foods. Avoid foods that have saturated fat or trans fats.  Check the ingredients list for added sugars, such as corn syrup. Shopping  At the grocery store, buy most of your  food from the areas near the walls of the store. This includes: ? Fresh fruits and vegetables (produce). ? Grains, beans, nuts, and seeds. Some of these may be available in unpackaged forms or large amounts (in bulk). ? Fresh seafood. ? Poultry and eggs. ? Low-fat dairy products.  Buy whole ingredients instead of prepackaged foods.  Buy fresh fruits and vegetables in-season from local farmers markets.  Buy frozen fruits and vegetables in resealable bags.  If you do not have access to quality fresh seafood, buy precooked frozen shrimp or canned fish, such as tuna, salmon, or sardines.  Buy small amounts of raw or cooked vegetables, salads, or olives from the deli or salad bar at your store.  Stock your pantry so you always have certain foods on hand, such as olive oil, canned tuna, canned tomatoes, rice, pasta, and beans. Cooking  Cook foods with extra-virgin olive oil instead of using butter or other vegetable oils.  Have meat as a side dish, and have vegetables or grains as your main dish. This means having meat in small portions or adding small amounts of meat to foods like pasta or stew.  Use beans or vegetables instead of meat in common dishes like chili or lasagna.  Experiment with different cooking methods. Try roasting or broiling vegetables instead of steaming or sauteing them.  Add frozen vegetables to soups, stews, pasta, or rice.  Add nuts or seeds for added healthy fat at each meal. You can  add these to yogurt, salads, or vegetable dishes.  Marinate fish or vegetables using olive oil, lemon juice, garlic, and fresh herbs. Meal planning  Plan to eat 1 vegetarian meal one day each week. Try to work up to 2 vegetarian meals, if possible.  Eat seafood 2 or more times a week.  Have healthy snacks readily available, such as: ? Vegetable sticks with hummus. ? [place] yogurt. ? Fruit and nut trail mix.  Eat balanced meals throughout the week. This includes: ? Fruit:  2-3 servings a day ? Vegetables: 4-5 servings a day ? Low-fat dairy: 2 servings a day ? Fish, poultry, or lean meat: 1 serving a day ? Beans and legumes: 2 or more servings a week ? Nuts and seeds: 1-2 servings a day ? Whole grains: 6-8 servings a day ? Extra-virgin olive oil: 3-4 servings a day  Limit red meat and sweets to only a few servings a month What are my food choices?  Mediterranean diet ? Recommended ? Grains: Whole-grain pasta. Brown rice. Bulgar wheat. Polenta. Couscous. Whole-wheat bread. [place]. ? Vegetables: Artichokes. Beets. Broccoli. Cabbage. Carrots. Eggplant. Green beans. Chard. Kale. Spinach. Onions. Leeks. Peas. Squash. Tomatoes. Peppers. Radishes. ? Fruits: Apples. Apricots. Avocado. Berries. Bananas. Cherries. Dates. Figs. Grapes. Lemons. Melon. Oranges. Peaches. Plums. Pomegranate. ? Meats and other protein foods: Beans. Almonds. Sunflower seeds. Pine nuts. Peanuts. [place]. Salmon. Scallops. Shrimp. [place]. Tilapia. Clams. Oysters. Eggs. ? Dairy: Low-fat milk. Cheese. Greek yogurt. ? Beverages: Water. Red wine. Herbal tea. ? Fats and oils: Extra virgin olive oil. Avocado oil. Grape seed oil. ? Sweets and desserts: [place] yogurt with honey. Baked apples. Poached pears. Trail mix. ? Seasoning and other foods: Basil. Cilantro. Coriander. Cumin. Mint. Parsley. Sage. Rosemary. Tarragon. Garlic. Oregano. Thyme. Pepper. Balsalmic vinegar. Tahini. Hummus. Tomato sauce. Olives. Mushrooms. ? Limit these ? Grains: Prepackaged pasta or rice dishes. Prepackaged cereal with added sugar. ? Vegetables: Deep fried potatoes (french fries). ? Fruits: Fruit canned in syrup. ? Meats and other protein foods: Beef. Pork. Lamb. Poultry with skin. Hot dogs. Berniece Salines. ? Dairy: Ice cream. Sour cream. Whole milk. ? Beverages: Juice. Sugar-sweetened soft drinks. Beer. Liquor and spirits. ? Fats and oils: Butter. Canola oil. Vegetable oil. Beef fat (tallow). Lard. ? Sweets and desserts:  Cookies. Cakes. Pies. Candy. ? Seasoning and other foods: Mayonnaise. Premade sauces and marinades. ? The items listed may not be a complete list. Talk with your dietitian about what dietary choices are right for you. Summary  The Mediterranean diet includes both food and lifestyle choices.  Eat a variety of fresh fruits and vegetables, beans, nuts, seeds, and whole grains.  Limit the amount of red meat and sweets that you eat.  Talk with your health care provider about whether it is safe for you to drink red wine in moderation. This means 1 glass a day for nonpregnant women and 2 glasses a day for men. A glass of wine equals 5 oz (150 mL). This information is not intended to replace advice given to you by your health care provider. Make sure you discuss any questions you have with your health care provider. Document Released: 09/12/2015 Document Revised: 10/15/2015 Document Reviewed: 09/12/2015 Elsevier Interactive Patient Education  [employer].

## 2017-11-12 NOTE — Addendum Note (Signed)
Addended byTomi Likens, Jiyah Torpey R on: 11/12/2017 10:02 AM   Modules accepted: Level of Service

## 2017-11-12 NOTE — Progress Notes (Signed)
NEUROLOGY CONSULTATION NOTE  HUSEIN GUEDES MRN: [id number] DOB: 08/08/1970  Referring provider: Arnette Norris, MD Primary care provider: Arnette Norris, MD  Reason for consult:  stroke  HISTORY OF PRESENT ILLNESS: [name] is a 47 year old male who presents for stroke.  History supplemented by hospital and referring provider's notes.  MRI and MRA of head personally reviewed.  He was admitted to [facility] from 10/05/2017 to 10/07/2017 after presenting with vertigo.  2 days prior, he began experiencing sensation of room spinning with right arm numbness, paresthesias and weakness and right leg paresthesias associated with headache and photophobia.  MRI of the brain revealed a probable punctate acute/early subacute ischemic infarct within the left thalamo-mesencephalic junction.  MRA of the head showed no aneurysm or large vessel occlusion or stenosis.  Carotid Doppler showed no hemodynamically significant extracranial stenosis.  2D echo demonstrated normal ejection fraction with no cardiac source of emboli.  LDL was 76.  Hemoglobin A1c was 5.9.  He was started on Lipitor 40mg  daily.  He was discharged on aspirin 81 mg and Plavix 75 mg daily for 3 weeks, followed by aspirin alone.  Since discharge, he continued to have a constant severe right-sided pressure-like headache involving the right temple and right side of face associated with right sided facial numbness and tingling.  He also noted worsening slurred speech, right upper extremity numbness and weakness.  He developed new blurred peripheral vision in his right eye.  He also endorsed postural lightheadedness when standing but not vertigo.  He presented to the ED on 11/06/17 for further evaluation.  Labs were unremarkable.  CT of head demonstrated no acute findings.  MRI of brain was normal with resolution of prior punctate focus.  He was treated with a migraine cocktail and discharged.  He had actually stopped the ASA and continued the  Plavix.  He reportedly had ran out of Plavix for several days.  Once he restarted Plavix, the headache resolved.  They now occur once a day for 2 hours or until he takes Tylenol.    PAST MEDICAL HISTORY: Past Medical History:  Diagnosis Date  . Anxiety    takes Xanax daily prn  . Chronic gastric ulcer with bleeding s/p suture repair 2012 09/29/2010   Head 3 EGD which failed to stop the bleeding. Had exploratory laparotomy on 10/04/2010. Possible cause of the gastric ulcer was NSAID use.   . Constipation    related to pain meds  . Dizziness    occasionally  . Erosive esophagitis   . External hemorrhoid, bleeding   . Gastric ulcer    history of  . GERD (gastroesophageal reflux disease)    takes Protonix bid  . Hiatal hernia   . History of blood clots 2012   in abdomen  . History of blood transfusion 2012  . Incisional hernia s/p open repair w mesh Aug 2013 05/26/2011  . Joint pain    knees  . Nocturia    depends on amount of fluid he drinks  . Pneumonia   . Pre-diabetes   . Primary localized osteoarthritis of left hip 04/24/2014  . Serrated polyp of colon   . Sleep apnea    no cpap  mild no cpap needed  . Upper GI bleed   . Ventral hernia     PAST SURGICAL HISTORY: Past Surgical History:  Procedure Laterality Date  . COLONOSCOPY    . ESOPHAGEAL MANOMETRY N/A 10/19/2016   Procedure: ESOPHAGEAL MANOMETRY (EM);  Surgeon: Mauri Pole, MD;  Location: [facility];  Service: Endoscopy;  Laterality: N/A;  . ESOPHAGOGASTRODUODENOSCOPY    . ESOPHAGOGASTRODUODENOSCOPY N/A 01/22/2017   Procedure: ESOPHAGOGASTRODUODENOSCOPY (EGD);  Surgeon: [name], MD;  Location: [facility];  Service: General;  Laterality: N/A;  . INCISIONAL HERNIA REPAIR  09/04/2011   Procedure: HERNIA REPAIR INCISIONAL;  Surgeon: Madilyn Hook, DO;  Location: [facility];  Service: General;  Laterality: N/A;  debridment calcified mass  . INSERTION OF MESH  09/04/2011   retrorectus ultrapro "30x30cm"  . INSERTION  OF MESH N/A 01/22/2017   Procedure: INSERTION OF MESH;  Surgeon: [name], MD;  Location: [facility];  Service: General;  Laterality: N/A;  . NISSEN FUNDOPLICATION     Open 1062I  . POLYPECTOMY    . STOMACH SURGERY  10/05/2010   Oversewing of gastric ulcer.  Dr Brantley Stage  . TOTAL HIP ARTHROPLASTY Left 04/24/2014   Procedure: LEFT TOTAL HIP ARTHROPLASTY;  Surgeon: Marchia Bond, MD;  Location: [place];  Service: Orthopedics;  Laterality: Left;  . UPPER GASTROINTESTINAL ENDOSCOPY      MEDICATIONS: Current Outpatient Medications on File Prior to Visit  Medication Sig Dispense Refill  . acetaminophen (TYLENOL) 500 MG tablet Take 1,000 mg by mouth every 6 (six) hours as needed for mild pain or headache.    Marland Kitchen aspirin EC 81 MG EC tablet Take 1 tablet (81 mg total) by mouth daily.    Marland Kitchen atorvastatin (LIPITOR) 40 MG tablet Take 1 tablet (40 mg total) by mouth daily at 6 PM. 30 tablet 2  . clonazePAM (KLONOPIN) 0.5 MG tablet Take 1 tablet (0.5 mg total) by mouth at bedtime. 30 tablet 2  . clopidogrel (PLAVIX) 75 MG tablet Take 1 tablet (75 mg total) by mouth daily. 30 tablet 1  . ondansetron (ZOFRAN) 4 MG tablet Take 1 tablet (4 mg total) by mouth every 8 (eight) hours as needed for nausea or vomiting. 20 tablet 0  . pantoprazole (PROTONIX) 40 MG tablet Take 1 tablet (40 mg total) by mouth 2 (two) times daily. 180 tablet 2  . traZODone (DESYREL) 50 MG tablet Take 50 mg by mouth daily as needed.  3  . Vitamin D, Ergocalciferol, (DRISDOL) 50000 units CAPS capsule Take 1 capsule by mouth every 7 days. Take for 2 months. (Patient taking differently: Take 50,000 Units by mouth every 7 (seven) days. On Monday) 4 capsule 0  . zaleplon (SONATA) 10 MG capsule Take 10 mg by mouth daily as needed.  2  . zolpidem (AMBIEN CR) 12.5 MG CR tablet Take 1 tablet (12.5 mg total) by mouth at bedtime as needed for sleep. 30 tablet 1   No current facility-administered medications on file prior to visit.      ALLERGIES: Allergies  Allergen Reactions  . Dilaudid [Hydromorphone Hcl] Rash and Other (See Comments)    Shakey  . Metoclopramide Hcl Hives, Swelling, Rash and Other (See Comments)    Swelling in lips  . Nsaids     H/o bleeding gastric ulcers  . Benadryl [Diphenhydramine]     Causes patient to become hyper and pace    FAMILY HISTORY: Family History  Problem Relation Age of Onset  . Diabetes Mother   . Liver disease Mother   . Diabetes Father   . Heart disease Father   . Colon cancer Neg Hx   . Stomach cancer Neg Hx   . Rectal cancer Neg Hx   . Esophageal cancer Neg Hx   . Colon polyps  Neg Hx     SOCIAL HISTORY: Social History   Socioeconomic History  . Marital status: Single    Spouse name: Not on file  . Number of children: 1  . Years of education: Not on file  . Highest education level: Not on file  Occupational History  . Occupation: Maintenance - [place]  Social Needs  . Financial resource strain: Not on file  . Food insecurity:    Worry: Not on file    Inability: Not on file  . Transportation needs:    Medical: Not on file    Non-medical: Not on file  Tobacco Use  . Smoking status: Never Smoker  . Smokeless tobacco: Current User    Types: Chew  . Tobacco comment: occ-Chew  Substance and Sexual Activity  . Alcohol use: Yes    Alcohol/week: 0.0 standard drinks    Comment: 09/04/11 "very seldom"  . Drug use: No  . Sexual activity: Yes  Lifestyle  . Physical activity:    Days per week: Not on file    Minutes per session: Not on file  . Stress: Not on file  Relationships  . Social connections:    Talks on phone: Not on file    Gets together: Not on file    Attends religious service: Not on file    Active member of club or organization: Not on file    Attends meetings of clubs or organizations: Not on file    Relationship status: Not on file  . Intimate partner violence:    Fear of current or ex partner: Not on file    Emotionally abused:  Not on file    Physically abused: Not on file    Forced sexual activity: Not on file  Other Topics Concern  . Not on file  Social History Narrative  . Not on file    REVIEW OF SYSTEMS: Constitutional: No fevers, chills, or sweats, no generalized fatigue, change in appetite Eyes: No visual changes, double vision, eye pain Ear, nose and throat: No hearing loss, ear pain, nasal congestion, sore throat Cardiovascular: No chest pain, palpitations Respiratory:  No shortness of breath at rest or with exertion, wheezes GastrointestinaI: No nausea, vomiting, diarrhea, abdominal pain, fecal incontinence Genitourinary:  No dysuria, urinary retention or frequency Musculoskeletal:  No neck pain, back pain Integumentary: No rash, pruritus, skin lesions Neurological: as above Psychiatric: No depression, insomnia, anxiety Endocrine: No palpitations, fatigue, diaphoresis, mood swings, change in appetite, change in weight, increased thirst Hematologic/Lymphatic:  No purpura, petechiae. Allergic/Immunologic: no itchy/runny eyes, nasal congestion, recent allergic reactions, rashes  PHYSICAL EXAM: Blood pressure 140/80, pulse 92, height 5\' 6"  (1.676 m), weight 204 lb 8 oz (92.8 kg), SpO2 96 %. General: No acute distress.  Patient appears well-groomed.  Head:  Normocephalic/atraumatic Eyes:  fundi examined but not visualized Neck: supple, no paraspinal tenderness, full range of motion Back: No paraspinal tenderness Heart: regular rate and rhythm Lungs: Clear to auscultation bilaterally. Vascular: No carotid bruits. Neurological Exam: Mental status: alert and oriented to person, place, and time, recent and remote memory intact, fund of knowledge intact, attention and concentration intact, speech fluent and not dysarthric, language intact. Cranial nerves: CN I: not tested CN II: pupils equal, round and reactive to light, visual fields intact CN III, IV, VI:  full range of motion, no nystagmus, no  ptosis CN V: facial sensation intact CN VII: upper and lower face symmetric CN VIII: hearing intact CN IX, X: gag intact, uvula midline  CN XI: sternocleidomastoid and trapezius muscles intact CN XII: tongue midline Bulk & Tone: normal, no fasciculations. Motor:  5-/5 right triceps and grip, otherwise 5/5 throughout  Sensation:  Reduced pinprick in right upper and lower extremities, vibration sensation intact. . Deep Tendon Reflexes:  3+ right brachioradialis, otherwise 2+ throughout,  toes downgoing.  Finger to nose testing:  Without dysmetria.  Heel to shin:  Without dysmetria.  Gait:  Normal station and stride.  Able to turn and tandem walk. Romberg negative.  IMPRESSION: 1.  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   2.  Headache, not intractable  PLAN: 1.  From a secondary stroke prevention standpoint, he may discontinue Plavix and remain on ASA 81mg  daily alone. 2.  Continue Lipitor 40mg  daily.  LDL goal should be less than 70 3.  For headache, we will start nortriptyline 10 mg at bedtime.  If headaches not improved in 4 weeks, he is to contact me and we can increase dose to 25 mg at bedtime. 4.  Limit the use of Tylenol to no more than 2 days of the week to prevent rebound headache. 5.  Exercise and Mediterranean diet recommended.  Follow up blood pressure with Dr. Deborra Medina. 6.  Refer to OT for right upper extremity weakness 7.  Follow up in 3 to 4 months.  Thank you for allowing me to take part in the care of this patient.  Metta Clines, DO  CC: Arnette Norris, MD

## 2017-11-15 ENCOUNTER — Telehealth: Payer: Self-pay

## 2017-11-15 NOTE — Telephone Encounter (Signed)
-----   Message from Pieter Partridge, DO sent at 11/15/2017  8:31 AM EDT ----- Lipid panel looks okay.  Continue current dose of atorvastatin.

## 2017-11-15 NOTE — Telephone Encounter (Signed)
Called and advised Pt of lab results 

## 2017-11-23 ENCOUNTER — Telehealth: Payer: Self-pay | Admitting: Neurology

## 2017-11-23 NOTE — Telephone Encounter (Signed)
Called and provided Pt with contact # to Neuro rehab

## 2017-11-23 NOTE — Telephone Encounter (Signed)
Patient has not heard from the OT that we referred him to please call patient with the information so he can call and get this set up

## 2017-12-13 MED FILL — clonazePAM 0.5 MG TABS: 0.5 | 30 days supply | Qty: 30 | Fill #1

## 2017-12-13 MED FILL — ZOLPIDEM TART ER 12.5 MG TA: 12.5 | 30 days supply | Qty: 30 | Fill #1

## 2017-12-14 ENCOUNTER — Encounter: Payer: Self-pay | Admitting: Family Medicine

## 2017-12-14 MED ORDER — VITAMIN D (ERGOCALCIFEROL) 1.25 MG (50000 UNIT) PO CAPS: 50000.0000 [IU] | ORAL_CAPSULE | ORAL | 0 refills | Status: DC

## 2017-12-14 MED FILL — NORTRIPTYLINE HCL 10 MG CAP: 10 | 30 days supply | Qty: 30 | Fill #1

## 2017-12-14 MED FILL — PANTOPRAZOLE SOD DR 40 MG T: 40 | 90 days supply | Qty: 180 | Fill #1

## 2017-12-14 MED FILL — VIT D2 1.25 MG (50,000 UNIT: 1.25 MG | 56 days supply | Qty: 8 | Fill #0

## 2017-12-14 MED FILL — ATORVASTATIN 40 MG TABLET: 40 | 30 days supply | Qty: 30 | Fill #1

## 2017-12-14 MED FILL — traZODone HCL 50 MG TABS: 50 | 30 days supply | Qty: 30 | Fill #3

## 2017-12-28 ENCOUNTER — Other Ambulatory Visit (INDEPENDENT_AMBULATORY_CARE_PROVIDER_SITE_OTHER): Payer: No Typology Code available for payment source

## 2017-12-28 ENCOUNTER — Ambulatory Visit (INDEPENDENT_AMBULATORY_CARE_PROVIDER_SITE_OTHER): Payer: No Typology Code available for payment source | Admitting: Neurology

## 2017-12-28 ENCOUNTER — Encounter: Payer: Self-pay | Admitting: Neurology

## 2017-12-28 VITALS — BP 116/84 | HR 85 | Ht 69.0 in | Wt 214.0 lb

## 2017-12-28 DIAGNOSIS — R202 Paresthesia of skin: Secondary | ICD-10-CM | POA: Diagnosis not present

## 2017-12-28 DIAGNOSIS — M79601 Pain in right arm: Secondary | ICD-10-CM

## 2017-12-28 DIAGNOSIS — R6889 Other general symptoms and signs: Secondary | ICD-10-CM

## 2017-12-28 DIAGNOSIS — M791 Myalgia, unspecified site: Secondary | ICD-10-CM

## 2017-12-28 DIAGNOSIS — M79602 Pain in left arm: Secondary | ICD-10-CM

## 2017-12-28 LAB — COMPREHENSIVE METABOLIC PANEL
ALT: 34 U/L (ref 0–53)
AST: 29 U/L (ref 0–37)
Albumin: 4.4 g/dL (ref 3.5–5.2)
Alkaline Phosphatase: 50 U/L (ref 39–117)
BILIRUBIN TOTAL: 0.4 mg/dL (ref 0.2–1.2)
BUN: 15 mg/dL (ref 6–23)
CALCIUM: 9.4 mg/dL (ref 8.4–10.5)
CHLORIDE: 104 meq/L (ref 96–112)
CO2: 27 meq/L (ref 19–32)
Creatinine, Ser: 0.97 mg/dL (ref 0.40–1.50)
GFR: 87.95 mL/min (ref >60.00)
GLUCOSE: 101 mg/dL — AB (ref 70–99)
POTASSIUM: 4 meq/L (ref 3.5–5.1)
Sodium: 138 mEq/L (ref 135–145)
Total Protein: 6.8 g/dL (ref 6.0–8.3)

## 2017-12-28 LAB — TSH: TSH: 2.3 u[IU]/mL (ref 0.35–4.50)

## 2017-12-28 LAB — VITAMIN B12: VITAMIN B 12: 1113 pg/mL — AB (ref 211–911)

## 2017-12-28 LAB — CK: Total CK: 551 U/L — ABNORMAL HIGH (ref 7–232)

## 2017-12-28 NOTE — Progress Notes (Signed)
NEUROLOGY FOLLOW UP OFFICE NOTE  [name] [id number]  HISTORY OF PRESENT ILLNESS: [name] is a 47 year old male who follows up after recent stroke.  UPDATE:  He is taking aspirin 81 mg daily for secondary stroke prevention.  He is taking atorvastatin 40 mg daily.  LDL from last month was 71.  Last month he was started on nortriptyline 10 mg at bedtime for headache.  He was referred to PT/OT.  He has had numbness and tingling in both arms and legs about a week ago.  He reports tightness and numbness and tingling in glove distribution up to elbows.  He reports tightness between his shoulder blades but no significant neck pain or radicular pain down the arms.  He also reports tightness and soreness in his hamstrings and his legs feel wobbly.  He denies low back pain.  It is a constant feeling.  He reports aches and soreness in the arms and legs.    HISTORY: He was admitted to [facility] from 10/05/2017 to 10/07/2017 after presenting with vertigo.  2 days prior, he began experiencing sensation of room spinning with right arm numbness, paresthesias and weakness and right leg paresthesias associated with headache and photophobia.  MRI of the brain revealed a probable punctate acute/early subacute ischemic infarct within the left thalamo-mesencephalic junction.  MRA of the head showed no aneurysm or large vessel occlusion or stenosis.  Carotid Doppler showed no hemodynamically significant extracranial stenosis.  2D echo demonstrated normal ejection fraction with no cardiac source of emboli.  LDL was 76.  Hemoglobin A1c was 5.9.  He was started on Lipitor 40mg  daily.  He was discharged on aspirin 81 mg and Plavix 75 mg daily for 3 weeks, followed by aspirin alone.  Following discharge, he continued to have a constant severe right-sided pressure-like headache involving the right temple and right side of face associated with right sided facial numbness and tingling.  He also noted worsening  slurred speech, right upper extremity numbness and weakness.  He developed new blurred peripheral vision in his right eye.  He also endorsed postural lightheadedness when standing but not vertigo.  He presented to the ED on 11/06/17 for further evaluation.  Labs were unremarkable.  CT of head demonstrated no acute findings.  MRI of brain was normal with resolution of prior punctate focus.  He was treated with a migraine cocktail and discharged.  He had actually stopped the ASA and continued the Plavix.  He reportedly had ran out of Plavix for several days.  Once he restarted Plavix, the headache resolved.  They now occur once a day for 2 hours or until he takes Tylenol.    PAST MEDICAL HISTORY: Past Medical History:  Diagnosis Date  . Anxiety    takes Xanax daily prn  . Chronic gastric ulcer with bleeding s/p suture repair 2012 09/29/2010   Head 3 EGD which failed to stop the bleeding. Had exploratory laparotomy on 10/04/2010. Possible cause of the gastric ulcer was NSAID use.   . Constipation    related to pain meds  . Dizziness    occasionally  . Erosive esophagitis   . External hemorrhoid, bleeding   . Gastric ulcer    history of  . GERD (gastroesophageal reflux disease)    takes Protonix bid  . Hiatal hernia   . History of blood clots 2012   in abdomen  . History of blood transfusion 2012  . Incisional hernia s/p open repair w mesh Aug  2013 05/26/2011  . Joint pain    knees  . Nocturia    depends on amount of fluid he drinks  . Pneumonia   . Pre-diabetes   . Primary localized osteoarthritis of left hip 04/24/2014  . Serrated polyp of colon   . Sleep apnea    no cpap  mild no cpap needed  . Upper GI bleed   . Ventral hernia     MEDICATIONS: Current Outpatient Medications on File Prior to Visit  Medication Sig Dispense Refill  . acetaminophen (TYLENOL) 500 MG tablet Take 1,000 mg by mouth every 6 (six) hours as needed for mild pain or headache.    Marland Kitchen aspirin EC 81 MG EC tablet  Take 1 tablet (81 mg total) by mouth daily.    Marland Kitchen atorvastatin (LIPITOR) 40 MG tablet Take 1 tablet (40 mg total) by mouth daily at 6 PM. 30 tablet 2  . clonazePAM (KLONOPIN) 0.5 MG tablet Take 1 tablet (0.5 mg total) by mouth at bedtime. 30 tablet 2  . nortriptyline (PAMELOR) 10 MG capsule Take 1 capsule (10 mg total) by mouth at bedtime. 30 capsule 3  . ondansetron (ZOFRAN) 4 MG tablet Take 1 tablet (4 mg total) by mouth every 8 (eight) hours as needed for nausea or vomiting. 20 tablet 0  . pantoprazole (PROTONIX) 40 MG tablet Take 1 tablet (40 mg total) by mouth 2 (two) times daily. 180 tablet 2  . traZODone (DESYREL) 50 MG tablet Take 50 mg by mouth daily as needed.  3  . Vitamin D, Ergocalciferol, (DRISDOL) 1.25 MG (50000 UT) CAPS capsule Take 1 capsule (50,000 Units total) by mouth every 7 (seven) days. On Monday 8 capsule 0  . zaleplon (SONATA) 10 MG capsule Take 10 mg by mouth daily as needed.  2  . zolpidem (AMBIEN CR) 12.5 MG CR tablet Take 1 tablet (12.5 mg total) by mouth at bedtime as needed for sleep. 30 tablet 1   No current facility-administered medications on file prior to visit.     ALLERGIES: Allergies  Allergen Reactions  . Dilaudid [Hydromorphone Hcl] Rash and Other (See Comments)    Shakey  . Metoclopramide Hcl Hives, Swelling, Rash and Other (See Comments)    Swelling in lips  . Nsaids     H/o bleeding gastric ulcers  . Benadryl [Diphenhydramine]     Causes patient to become hyper and pace    FAMILY HISTORY: Family History  Problem Relation Age of Onset  . Diabetes Mother   . Liver disease Mother   . Diabetes Father   . Heart disease Father   . Colon cancer Neg Hx   . Stomach cancer Neg Hx   . Rectal cancer Neg Hx   . Esophageal cancer Neg Hx   . Colon polyps Neg Hx     SOCIAL HISTORY: Social History   Socioeconomic History  . Marital status: Single    Spouse name: Not on file  . Number of children: 1  . Years of education: 39  . Highest  education level: High school graduate  Occupational History  . Occupation: Maintenance - [place]  Social Needs  . Financial resource strain: Not on file  . Food insecurity:    Worry: Not on file    Inability: Not on file  . Transportation needs:    Medical: Not on file    Non-medical: Not on file  Tobacco Use  . Smoking status: Never Smoker  . Smokeless tobacco: Current User  Types: Chew  . Tobacco comment: occ-Chew  Substance and Sexual Activity  . Alcohol use: Yes    Alcohol/week: 0.0 standard drinks    Comment: 09/04/11 "very seldom"  . Drug use: No  . Sexual activity: Yes  Lifestyle  . Physical activity:    Days per week: Not on file    Minutes per session: Not on file  . Stress: Not on file  Relationships  . Social connections:    Talks on phone: Not on file    Gets together: Not on file    Attends religious service: Not on file    Active member of club or organization: Not on file    Attends meetings of clubs or organizations: Not on file    Relationship status: Not on file  . Intimate partner violence:    Fear of current or ex partner: Not on file    Emotionally abused: Not on file    Physically abused: Not on file    Forced sexual activity: Not on file  Other Topics Concern  . Not on file  Social History Narrative   Lives with brother in a one story home.  Has one daughter.  Works in Theatre manager for [employer].  Education: high school.     REVIEW OF SYSTEMS: Constitutional: No fevers, chills, or sweats, no generalized fatigue, change in appetite Eyes: No visual changes, double vision, eye pain Ear, nose and throat: No hearing loss, ear pain, nasal congestion, sore throat Cardiovascular: No chest pain, palpitations Respiratory:  No shortness of breath at rest or with exertion, wheezes GastrointestinaI: No nausea, vomiting, diarrhea, abdominal pain, fecal incontinence Genitourinary:  No dysuria, urinary retention or frequency Musculoskeletal:  No neck  pain, back pain Integumentary: No rash, pruritus, skin lesions Neurological: as above Psychiatric: No depression, insomnia, anxiety Endocrine: No palpitations, fatigue, diaphoresis, mood swings, change in appetite, change in weight, increased thirst Hematologic/Lymphatic:  No purpura, petechiae. Allergic/Immunologic: no itchy/runny eyes, nasal congestion, recent allergic reactions, rashes  PHYSICAL EXAM: Blood pressure 116/84, pulse 85, height 5\' 9"  (1.753 m), weight 214 lb (97.1 kg), SpO2 97 %. General: No acute distress.  Patient appears well-groomed.   Head:  Normocephalic/atraumatic Eyes:  Fundi examined but not visualized Neck: supple, no paraspinal tenderness, full range of motion Heart:  Regular rate and rhythm Lungs:  Clear to auscultation bilaterally Back: No paraspinal tenderness Neurological Exam: alert and oriented to person, place, and time. Attention span and concentration intact, recent and remote memory intact, fund of knowledge intact.  Speech fluent and not dysarthric, language intact.  CN II-XII intact. Bulk and tone normal, muscle strength 5-/5 right triceps and grip, otherwise 5/5 throughout.  Reduced pinprick sensation in the upper extremities up to just below the elbows.  Vibration sensation intact.  Deep tendon reflexes 3+ right brachial radialis, otherwise 2+ throughout.  Finger to nose and heel to shin testing intact.  Gait normal, Romberg negative.  IMPRESSION: 1.  Paresthesias/myalgias.  The paresthesias may actually be the "tightness" sensation of the myalgias.  Suspect side effect of atorvastatin 2.  Left thalamic lobe mesencephalic junction infarct secondary to small vessel disease with residual mild right upper extremity monoplegia and right-sided numbness.  PLAN: 1.  He will stop atorvastatin.  He will contact me in 1 week with update.  If improved, we will try every other day dosing. 2.  We will check CMP, CK, B12 and TSH 3.  May take Tylenol as needed for  pain 4.  Follow up as  already scheduled in February.  25 minutes spent face to face with patient, over 50% spent discussing management.  Metta Clines, DO  CC: Arnette Norris, MD

## 2017-12-28 NOTE — Patient Instructions (Addendum)
I think the symptoms are likely related to the atorvastatin.   1.  Stop atorvastatin.  Contact me in one week with update 2.  We will check CMP, CK, B12, TSH 3.  Take Tylenol as needed. 4.  Follow up as already scheduled in February.  Your provider has requested that you have labwork completed today. Please go to [facility] ([id number]) on the second floor of this building before leaving the office today. You do not need to check in. If you are not called within 15 minutes please check with the front desk.

## 2017-12-29 ENCOUNTER — Telehealth: Payer: Self-pay

## 2017-12-29 NOTE — Telephone Encounter (Signed)
Called and advised Pt 

## 2017-12-29 NOTE — Telephone Encounter (Signed)
-----   Message from Pieter Partridge, DO sent at 12/29/2017 12:06 PM EST ----- There is an elevated muscle enzyme in the lab suggesting muscle injury, likely from the atorvastatin.  Remain off atorvastatin for now and we can recheck CK in 3 months.

## 2018-01-03 ENCOUNTER — Encounter: Payer: Self-pay | Admitting: Family Medicine

## 2018-01-03 ENCOUNTER — Ambulatory Visit (INDEPENDENT_AMBULATORY_CARE_PROVIDER_SITE_OTHER): Payer: No Typology Code available for payment source | Admitting: Family Medicine

## 2018-01-03 VITALS — BP 126/86 | HR 106 | Temp 98.7°F | Ht 69.0 in | Wt 209.6 lb

## 2018-01-03 DIAGNOSIS — M6282 Rhabdomyolysis: Secondary | ICD-10-CM

## 2018-01-03 DIAGNOSIS — R202 Paresthesia of skin: Secondary | ICD-10-CM | POA: Diagnosis not present

## 2018-01-03 DIAGNOSIS — R748 Abnormal levels of other serum enzymes: Secondary | ICD-10-CM | POA: Diagnosis not present

## 2018-01-03 LAB — COMPREHENSIVE METABOLIC PANEL
ALBUMIN: 4.8 g/dL (ref 3.5–5.2)
ALT: 32 U/L (ref 0–53)
AST: 21 U/L (ref 0–37)
Alkaline Phosphatase: 57 U/L (ref 39–117)
BILIRUBIN TOTAL: 0.4 mg/dL (ref 0.2–1.2)
BUN: 17 mg/dL (ref 6–23)
CO2: 26 mEq/L (ref 19–32)
Calcium: 10.1 mg/dL (ref 8.4–10.5)
Chloride: 103 mEq/L (ref 96–112)
Creatinine, Ser: 1.04 mg/dL (ref 0.40–1.50)
GFR: 81.15 mL/min (ref >60.00)
Glucose, Bld: 110 mg/dL — ABNORMAL HIGH (ref 70–99)
POTASSIUM: 4.6 meq/L (ref 3.5–5.1)
SODIUM: 137 meq/L (ref 135–145)
TOTAL PROTEIN: 7.1 g/dL (ref 6.0–8.3)

## 2018-01-03 LAB — C-REACTIVE PROTEIN: CRP: 0.1 mg/dL — ABNORMAL LOW (ref 0.5–20.0)

## 2018-01-03 LAB — CK: CK TOTAL: 306 U/L — AB (ref 7–232)

## 2018-01-03 NOTE — Patient Instructions (Signed)
Great to see you. I will call you with your lab results from today .

## 2018-01-03 NOTE — Assessment & Plan Note (Signed)
>  25 minutes spent in face to face time with patient, >50% spent in counselling or coordination of care. The statin makes the most as the contributing cause- may still be flushing out of his system.  Recheck CK today and additional labs including Vit D today, CRP (? PMR). Advised to push fluids. Orders Placed This Encounter  Procedures  . CK (Creatine Kinase)  . Vitamin D (25 hydroxy)  . B. burgdorfi antibodies  . Rocky mtn spotted fvr abs pnl(IgG+IgM)  . C-reactive protein  . Comprehensive metabolic panel

## 2018-01-03 NOTE — Progress Notes (Signed)
Subjective:   Patient ID: [name], male    DOB: 09/23/1970, 47 y.o.   MRN: [id number]  [name] is a pleasant 47 y.o. year old male who presents to clinic today with Numbness (Patient is here today C/O numbness in arms.  He saw Neuro [name] on 11.26.19 who thought that this was caused by the Lipitor which he D/C'ed and told to call him in 1 week with an update.  Pt was updated on 11.27.19 on lab results that there was an elevation in mucsle enzyme likely due to lipitor and would recheck in 3 months.  Total CK was 551 and ref rage being 7-232 and B-12 also elevated.  He also states that his legs feel like jelley. He states that he had been sitting at his desk on Wed and his) and addendum (whole left side was numb and he was unable to move anything.  He saw [name] after that.  States that the pain if from elbows to fingers  and pain between shoulder blades.  He states that he has like a jolt of pain with pins and needles down his legs bilaterally and denies any lower back pain.  States "It's like I have no strength in them.  )  on 01/03/2018  HPI:  Numbness in his arms-  Saw [name], neurology, on 12/28/17 (follow up CVA)- note reviewed. At that OV he complained of 1 week of tingling in arms and legs- in his arms- glove distribution up to his elbows.  Also tightness between his shoulder blades.  [name] checked labs- CMP, CK, B12, TSH. All unremarkable except CK was elevated at 551. No improvement since he stopped taking the statin. He actually thinks it is getting worse.  No tick bites that he is aware of. Lab Results  Component Value Date   CKTOTAL 551 (H) 12/28/2017   CKMB 3.1 09/29/2010   TROPONINI <0.30 01/24/2011   Lab Results  Component Value Date   ALT 34 12/28/2017   AST 29 12/28/2017   ALKPHOS 50 12/28/2017   BILITOT 0.4 12/28/2017    Advised to stop lipitor and then to follow up in 1 week, recheck labs- CK and lipid panel in 3 months.   Current  Outpatient Medications on File Prior to Visit  Medication Sig Dispense Refill  . acetaminophen (TYLENOL) 500 MG tablet Take 1,000 mg by mouth every 6 (six) hours as needed for mild pain or headache.    Marland Kitchen aspirin EC 81 MG EC tablet Take 1 tablet (81 mg total) by mouth daily.    . clonazePAM (KLONOPIN) 0.5 MG tablet Take 1 tablet (0.5 mg total) by mouth at bedtime. 30 tablet 2  . nortriptyline (PAMELOR) 10 MG capsule Take 1 capsule (10 mg total) by mouth at bedtime. 30 capsule 3  . ondansetron (ZOFRAN) 4 MG tablet Take 1 tablet (4 mg total) by mouth every 8 (eight) hours as needed for nausea or vomiting. 20 tablet 0  . pantoprazole (PROTONIX) 40 MG tablet Take 1 tablet (40 mg total) by mouth 2 (two) times daily. 180 tablet 2  . traZODone (DESYREL) 50 MG tablet Take 50 mg by mouth daily as needed.  3  . Vitamin D, Ergocalciferol, (DRISDOL) 1.25 MG (50000 UT) CAPS capsule Take 1 capsule (50,000 Units total) by mouth every 7 (seven) days. On Monday 8 capsule 0  . zolpidem (AMBIEN CR) 12.5 MG CR tablet Take 1 tablet (12.5 mg total) by mouth at bedtime  as needed for sleep. 30 tablet 1   No current facility-administered medications on file prior to visit.     Allergies  Allergen Reactions  . Dilaudid [Hydromorphone Hcl] Rash and Other (See Comments)    Shakey  . Metoclopramide Hcl Hives, Swelling, Rash and Other (See Comments)    Swelling in lips  . Nsaids     H/o bleeding gastric ulcers  . Benadryl [Diphenhydramine]     Causes patient to become hyper and pace    Past Medical History:  Diagnosis Date  . Anxiety    takes Xanax daily prn  . Chronic gastric ulcer with bleeding s/p suture repair 2012 09/29/2010   Head 3 EGD which failed to stop the bleeding. Had exploratory laparotomy on 10/04/2010. Possible cause of the gastric ulcer was NSAID use.   . Constipation    related to pain meds  . Dizziness    occasionally  . Erosive esophagitis   . External hemorrhoid, bleeding   . Gastric  ulcer    history of  . GERD (gastroesophageal reflux disease)    takes Protonix bid  . Hiatal hernia   . History of blood clots 2012   in abdomen  . History of blood transfusion 2012  . Incisional hernia s/p open repair w mesh Aug 2013 05/26/2011  . Joint pain    knees  . Nocturia    depends on amount of fluid he drinks  . Pneumonia   . Pre-diabetes   . Primary localized osteoarthritis of left hip 04/24/2014  . Serrated polyp of colon   . Sleep apnea    no cpap  mild no cpap needed  . Upper GI bleed   . Ventral hernia     Past Surgical History:  Procedure Laterality Date  . COLONOSCOPY    . ESOPHAGEAL MANOMETRY N/A 10/19/2016   Procedure: ESOPHAGEAL MANOMETRY (EM);  Surgeon: Mauri Pole, MD;  Location: [facility];  Service: Endoscopy;  Laterality: N/A;  . ESOPHAGOGASTRODUODENOSCOPY    . ESOPHAGOGASTRODUODENOSCOPY N/A 01/22/2017   Procedure: ESOPHAGOGASTRODUODENOSCOPY (EGD);  Surgeon: [name], MD;  Location: [facility];  Service: General;  Laterality: N/A;  . INCISIONAL HERNIA REPAIR  09/04/2011   Procedure: HERNIA REPAIR INCISIONAL;  Surgeon: Madilyn Hook, DO;  Location: [facility];  Service: General;  Laterality: N/A;  debridment calcified mass  . INSERTION OF MESH  09/04/2011   retrorectus ultrapro "30x30cm"  . INSERTION OF MESH N/A 01/22/2017   Procedure: INSERTION OF MESH;  Surgeon: [name], MD;  Location: [facility];  Service: General;  Laterality: N/A;  . NISSEN FUNDOPLICATION     Open [id number]  . POLYPECTOMY    . STOMACH SURGERY  10/05/2010   Oversewing of gastric ulcer.  Dr Brantley Stage  . TOTAL HIP ARTHROPLASTY Left 04/24/2014   Procedure: LEFT TOTAL HIP ARTHROPLASTY;  Surgeon: Marchia Bond, MD;  Location: [facility];  Service: Orthopedics;  Laterality: Left;  . UPPER GASTROINTESTINAL ENDOSCOPY      Family History  Problem Relation Age of Onset  . Diabetes Mother   . Liver disease Mother   . Diabetes Father   . Heart disease Father   . Colon cancer Neg Hx   .  Stomach cancer Neg Hx   . Rectal cancer Neg Hx   . Esophageal cancer Neg Hx   . Colon polyps Neg Hx     Social History   Socioeconomic History  . Marital status: Single    Spouse name: Not on file  . Number  of children: 1  . Years of education: 52  . Highest education level: High school graduate  Occupational History  . Occupation: Maintenance - [place]  Social Needs  . Financial resource strain: Not on file  . Food insecurity:    Worry: Not on file    Inability: Not on file  . Transportation needs:    Medical: Not on file    Non-medical: Not on file  Tobacco Use  . Smoking status: Never Smoker  . Smokeless tobacco: Current User    Types: Chew  . Tobacco comment: occ-Chew  Substance and Sexual Activity  . Alcohol use: Yes    Alcohol/week: 0.0 standard drinks    Comment: 09/04/11 "very seldom"  . Drug use: No  . Sexual activity: Yes  Lifestyle  . Physical activity:    Days per week: Not on file    Minutes per session: Not on file  . Stress: Not on file  Relationships  . Social connections:    Talks on phone: Not on file    Gets together: Not on file    Attends religious service: Not on file    Active member of club or organization: Not on file    Attends meetings of clubs or organizations: Not on file    Relationship status: Not on file  . Intimate partner violence:    Fear of current or ex partner: Not on file    Emotionally abused: Not on file    Physically abused: Not on file    Forced sexual activity: Not on file  Other Topics Concern  . Not on file  Social History Narrative   Lives with brother in a one story home.  Has one daughter.  Works in Theatre manager for [employer].  Education: high school.    The PMH, PSH, Social History, Family History, Medications, and allergies have been reviewed in [facility], and have been updated if relevant.       Review of Systems  Constitutional: Negative.   Musculoskeletal: Positive for myalgias. Negative for  arthralgias, back pain, gait problem, joint swelling, neck pain and neck stiffness.  Skin: Negative.   Neurological: Positive for weakness and numbness. Negative for dizziness, tremors, seizures, syncope, facial asymmetry, speech difficulty, light-headedness and headaches.  Hematological: Negative.   Psychiatric/Behavioral: Negative.   All other systems reviewed and are negative.      Objective:    BP 126/86 (BP Location: Left Arm, Patient Position: Sitting, Cuff Size: Normal)   Pulse (!) 106   Temp 98.7 F (37.1 C) (Oral)   Ht 5\' 9"  (1.753 m)   Wt 209 lb 9.6 oz (95.1 kg)   SpO2 97%   BMI 30.95 kg/m    Physical Exam  Constitutional: He is oriented to person, place, and time. He appears well-developed and well-nourished. No distress.  HENT:  Head: Normocephalic and atraumatic.  Eyes: EOM are normal.  Neck: Normal range of motion.  Cardiovascular: Normal rate.  Pulmonary/Chest: Effort normal.  Musculoskeletal:  Normal grip strength bilaterally.  Neurological: He is alert and oriented to person, place, and time. No cranial nerve deficit.  Skin: Skin is warm and dry. He is not diaphoretic.  Psychiatric: He has a normal mood and affect. His behavior is normal. Judgment and thought content normal.  Nursing note and vitals reviewed.         Assessment & Plan:   Paresthesia of upper and lower extremities of both sides - Plan: CK (Creatine Kinase), Vitamin D (25 hydroxy),  B. burgdorfi antibodies, Rocky mtn spotted fvr abs pnl(IgG+IgM), C-reactive protein, CANCELED: Hepatic function panel  Elevated CK - Plan: CK (Creatine Kinase)  Non-traumatic rhabdomyolysis - Plan: CK (Creatine Kinase), Vitamin D (25 hydroxy), B. burgdorfi antibodies, Rocky mtn spotted fvr abs pnl(IgG+IgM), C-reactive protein, Comprehensive metabolic panel, CANCELED: Hepatic function panel No follow-ups on file.

## 2018-01-04 ENCOUNTER — Other Ambulatory Visit: Payer: Self-pay | Admitting: Family Medicine

## 2018-01-04 LAB — VITAMIN D 25 HYDROXY (VIT D DEFICIENCY, FRACTURES): VITD: 29.69 ng/mL — ABNORMAL LOW (ref 30.00–100.00)

## 2018-01-04 MED ORDER — VITAMIN D (ERGOCALCIFEROL) 1.25 MG (50000 UNIT) PO CAPS: 50000.0000 [IU] | ORAL_CAPSULE | ORAL | 0 refills | Status: DC

## 2018-01-06 ENCOUNTER — Encounter: Payer: Self-pay | Admitting: Family Medicine

## 2018-01-06 LAB — B. BURGDORFI ANTIBODIES: B burgdorferi Ab IgG+IgM: <0.9 index

## 2018-01-06 LAB — ROCKY MTN SPOTTED FVR ABS PNL(IGG+IGM)
RMSF IGG: NOT DETECTED
RMSF IGM: NOT DETECTED

## 2018-01-06 NOTE — Telephone Encounter (Signed)
Spoke with patient-he started Vit D 50,000 on 01/04/18-Numbness has not improved-stomach tightness has just started. Denies nausea or vomiting. Please advise

## 2018-01-10 ENCOUNTER — Encounter: Payer: Self-pay | Admitting: Family Medicine

## 2018-01-10 MED FILL — clonazePAM 0.5 MG TABS: 0.5 | 30 days supply | Qty: 30 | Fill #2

## 2018-01-11 ENCOUNTER — Other Ambulatory Visit: Payer: Self-pay | Admitting: Family Medicine

## 2018-01-11 ENCOUNTER — Encounter: Payer: Self-pay | Admitting: Family Medicine

## 2018-01-11 MED ORDER — ZOLPIDEM TARTRATE ER 12.5 MG PO TBCR: 12.5000 mg | EXTENDED_RELEASE_TABLET | Freq: Every evening | ORAL | 1 refills | Status: DC | PRN

## 2018-01-11 MED FILL — ZOLPIDEM TART ER 12.5 MG TA: 12.5 | 30 days supply | Qty: 30 | Fill #0

## 2018-01-12 ENCOUNTER — Encounter: Payer: Self-pay | Admitting: Family Medicine

## 2018-01-12 ENCOUNTER — Telehealth: Payer: Self-pay | Admitting: Neurology

## 2018-01-12 NOTE — Telephone Encounter (Signed)
Patient  Came in today and states that he is still having really bad numbness and tingling in both hands and both legs. Feeling really bad. He has fell several times in the last 2 weeks. Patient has seen the PCP Dr as well and would like to get in sooner with [facility] please call to let him know what he can do about the numbness and pain that he is in

## 2018-01-12 NOTE — Telephone Encounter (Signed)
Called and LMOVM for Pt. Advising him I can place him on wait list. Pt contacted Dr Deborra Medina today also.

## 2018-01-14 ENCOUNTER — Other Ambulatory Visit: Payer: Self-pay | Admitting: Family Medicine

## 2018-01-14 ENCOUNTER — Other Ambulatory Visit: Payer: No Typology Code available for payment source

## 2018-01-14 DIAGNOSIS — R202 Paresthesia of skin: Secondary | ICD-10-CM

## 2018-01-14 LAB — CK: Total CK: 334 U/L — ABNORMAL HIGH (ref 44–196)

## 2018-01-14 NOTE — Progress Notes (Signed)
Thank you :)

## 2018-01-16 NOTE — Progress Notes (Signed)
NEUROLOGY FOLLOW UP OFFICE NOTE  [name] [id number]  HISTORY OF PRESENT ILLNESS: [name] is a 47 year old male who follows up after recent stroke.  UPDATE:  He is taking aspirin 81 mg daily for secondary stroke prevention.  He is taking atorvastatin 40 mg daily.  LDL from last month was 71.  Last month he was started on nortriptyline 10 mg at bedtime for headache.  He was referred to PT/OT.  He has had numbness and tingling in both arms and legs for about a month  He reports tightness and numbness and tingling in glove distribution up to elbows.  He reports tightness between his shoulder blades but no significant neck pain or radicular pain down the arms.  He also reports tightness and soreness in his hamstrings and his legs feel wobbly.  He denies low back pain.  It is a constant feeling.  He reports aches and soreness in the arms and legs. Labs from 12/28/17 included TSH 2.30, B12 1,113, and elevated CK of 551.  He was advised to stop atorvastatin but symptoms have persisted.  Labs from 01/04/18 showed reduced CK of 306, CRP 0.1, vitamin D 29.69, negative Lyme IgG and IgM antibodies, and negative RMSF antibody.  Repeat CK  From 01/14/18 was mildly increased to 334.    HISTORY: He was admitted to [facility] from 10/05/2017 to 10/07/2017 after presenting with vertigo. 2 days prior, he began experiencing sensation of room spinning with right arm numbness, paresthesias and weakness and right leg paresthesiasassociated with headache and photophobia. MRI of the brain revealed a probable punctate acute/early subacute ischemic infarct within the left thalamo-mesencephalic junction. MRA of the head showed no aneurysm or large vessel occlusion or stenosis. Carotid Doppler showed no hemodynamically significant extracranial stenosis. 2D echo demonstrated normal ejection fraction with no cardiac source of emboli. LDL was 76. Hemoglobin A1c was 5.9. He was started on Lipitor 40mg   daily.He was discharged on aspirin 81 mg and Plavix 75 mg daily for 3 weeks, followed by aspirin alone.  Following discharge, he continued to have a constantsevereright-sided pressure-like headache involving the right temple and right side of face associated with right sided facial numbness and tingling. He also noted worsening slurred speech, right upper extremity numbness and weakness. He developed new blurred peripheral vision in his right eye. He also endorsed postural lightheadedness when standing but not vertigo. He presented to the ED on 11/06/17 for further evaluation. Labs were unremarkable. CT of head demonstrated no acute findings. MRI of brain was normal with resolution of prior punctate focus. He was treated with a migraine cocktail and discharged. He had actually stopped the ASA and continued the Plavix.He reportedly had ran out of Plavix for several days. Once he restarted Plavix, the headache resolved. They now occur once a day for 2 hours or until he takes Tylenol.  PAST MEDICAL HISTORY: Past Medical History:  Diagnosis Date  . Anxiety    takes Xanax daily prn  . Chronic gastric ulcer with bleeding s/p suture repair 2012 09/29/2010   Head 3 EGD which failed to stop the bleeding. Had exploratory laparotomy on 10/04/2010. Possible cause of the gastric ulcer was NSAID use.   . Constipation    related to pain meds  . Dizziness    occasionally  . Erosive esophagitis   . External hemorrhoid, bleeding   . Gastric ulcer    history of  . GERD (gastroesophageal reflux disease)    takes Protonix bid  .  Hiatal hernia   . History of blood clots 2012   in abdomen  . History of blood transfusion 2012  . Incisional hernia s/p open repair w mesh Aug 2013 05/26/2011  . Joint pain    knees  . Nocturia    depends on amount of fluid he drinks  . Pneumonia   . Pre-diabetes   . Primary localized osteoarthritis of left hip 04/24/2014  . Serrated polyp of colon   . Sleep  apnea    no cpap  mild no cpap needed  . Upper GI bleed   . Ventral hernia     MEDICATIONS: Current Outpatient Medications on File Prior to Visit  Medication Sig Dispense Refill  . acetaminophen (TYLENOL) 500 MG tablet Take 1,000 mg by mouth every 6 (six) hours as needed for mild pain or headache.    Marland Kitchen aspirin EC 81 MG EC tablet Take 1 tablet (81 mg total) by mouth daily.    . clonazePAM (KLONOPIN) 0.5 MG tablet Take 1 tablet (0.5 mg total) by mouth at bedtime. 30 tablet 2  . nortriptyline (PAMELOR) 10 MG capsule Take 1 capsule (10 mg total) by mouth at bedtime. 30 capsule 3  . ondansetron (ZOFRAN) 4 MG tablet Take 1 tablet (4 mg total) by mouth every 8 (eight) hours as needed for nausea or vomiting. 20 tablet 0  . pantoprazole (PROTONIX) 40 MG tablet Take 1 tablet (40 mg total) by mouth 2 (two) times daily. 180 tablet 2  . traZODone (DESYREL) 50 MG tablet Take 50 mg by mouth daily as needed.  3  . Vitamin D, Ergocalciferol, (DRISDOL) 1.25 MG (50000 UT) CAPS capsule Take 1 capsule (50,000 Units total) by mouth every 7 (seven) days. On Monday 12 capsule 0  . zolpidem (AMBIEN CR) 12.5 MG CR tablet Take 1 tablet (12.5 mg total) by mouth at bedtime as needed for sleep. 30 tablet 1   No current facility-administered medications on file prior to visit.     ALLERGIES: Allergies  Allergen Reactions  . Dilaudid [Hydromorphone Hcl] Rash and Other (See Comments)    Shakey  . Metoclopramide Hcl Hives, Swelling, Rash and Other (See Comments)    Swelling in lips  . Nsaids     H/o bleeding gastric ulcers  . Benadryl [Diphenhydramine]     Causes patient to become hyper and pace    FAMILY HISTORY: Family History  Problem Relation Age of Onset  . Diabetes Mother   . Liver disease Mother   . Diabetes Father   . Heart disease Father   . Colon cancer Neg Hx   . Stomach cancer Neg Hx   . Rectal cancer Neg Hx   . Esophageal cancer Neg Hx   . Colon polyps Neg Hx     SOCIAL HISTORY: Social  History   Socioeconomic History  . Marital status: Single    Spouse name: Not on file  . Number of children: 1  . Years of education: 61  . Highest education level: High school graduate  Occupational History  . Occupation: Maintenance - [employer]  Social Needs  . Financial resource strain: Not on file  . Food insecurity:    Worry: Not on file    Inability: Not on file  . Transportation needs:    Medical: Not on file    Non-medical: Not on file  Tobacco Use  . Smoking status: Never Smoker  . Smokeless tobacco: Current User    Types: Chew  . Tobacco comment:  occ-Chew  Substance and Sexual Activity  . Alcohol use: Yes    Alcohol/week: 0.0 standard drinks    Comment: 09/04/11 "very seldom"  . Drug use: No  . Sexual activity: Yes  Lifestyle  . Physical activity:    Days per week: Not on file    Minutes per session: Not on file  . Stress: Not on file  Relationships  . Social connections:    Talks on phone: Not on file    Gets together: Not on file    Attends religious service: Not on file    Active member of club or organization: Not on file    Attends meetings of clubs or organizations: Not on file    Relationship status: Not on file  . Intimate partner violence:    Fear of current or ex partner: Not on file    Emotionally abused: Not on file    Physically abused: Not on file    Forced sexual activity: Not on file  Other Topics Concern  . Not on file  Social History Narrative   Lives with brother in a one story home.  Has one daughter.  Works in Theatre manager for [employer].  Education: high school.     REVIEW OF SYSTEMS: Constitutional: No fevers, chills, or sweats, no generalized fatigue, change in appetite Eyes: No visual changes, double vision, eye pain Ear, nose and throat: No hearing loss, ear pain, nasal congestion, sore throat Cardiovascular: No chest pain, palpitations Respiratory:  No shortness of breath at rest or with exertion, wheezes GastrointestinaI:  No nausea, vomiting, diarrhea, abdominal pain, fecal incontinence Genitourinary:  No dysuria, urinary retention or frequency Musculoskeletal:  No neck pain, back pain Integumentary: No rash, pruritus, skin lesions Neurological: as above Psychiatric: No depression, insomnia, anxiety Endocrine: No palpitations, fatigue, diaphoresis, mood swings, change in appetite, change in weight, increased thirst Hematologic/Lymphatic:  No purpura, petechiae. Allergic/Immunologic: no itchy/runny eyes, nasal congestion, recent allergic reactions, rashes  PHYSICAL EXAM: Blood pressure 122/82, pulse (!) 106, height 5\' 9"  (1.753 m), weight 208 lb (94.3 kg), SpO2 93 %. General: No acute distress.  Patient appears well-groomed. Head:  Normocephalic/atraumatic Eyes:  Fundi examined but not visualized Neck: supple, no paraspinal tenderness, full range of motion Heart:  Regular rate and rhythm Lungs:  Clear to auscultation bilaterally Back: No paraspinal tenderness Neurological Exam: alert and oriented to person, place, and time. Attention span and concentration intact, recent and remote memory intact, fund of knowledge intact.  Speech fluent and not dysarthric, language intact.  CN II-XII intact. Bulk and tone normal, muscle strength 5/5 throughout.  Sensation to light touch, temperature and vibration intact.  Deep tendon reflexes 2+ throughout, toes downgoing.  Finger to nose and heel to shin testing intact.  Gait normal, Romberg negative.  IMPRESSION: Myalgias/paresthesias/pain.  CK mildly increased over past 2 weeks but overall decreased from initial test.  Statin may still be possible cause.    PLAN: 1.  Plan for NCV-EMG to evaluate for myopathy and/or peripheral neuropathy 2.  Further recommendations pending results.   3.  Follow up in February as already scheduled.  19 minutes spent face to face with patient, over 50% spent discussing management.  Metta Clines, DO  CC:  Arnette Norris, MD

## 2018-01-17 ENCOUNTER — Ambulatory Visit (INDEPENDENT_AMBULATORY_CARE_PROVIDER_SITE_OTHER): Payer: No Typology Code available for payment source | Admitting: Neurology

## 2018-01-17 ENCOUNTER — Encounter: Payer: Self-pay | Admitting: Neurology

## 2018-01-17 VITALS — BP 122/82 | HR 106 | Ht 69.0 in | Wt 208.0 lb

## 2018-01-17 DIAGNOSIS — M79601 Pain in right arm: Secondary | ICD-10-CM

## 2018-01-17 DIAGNOSIS — R202 Paresthesia of skin: Secondary | ICD-10-CM | POA: Diagnosis not present

## 2018-01-17 DIAGNOSIS — M79602 Pain in left arm: Secondary | ICD-10-CM

## 2018-01-17 DIAGNOSIS — M791 Myalgia, unspecified site: Secondary | ICD-10-CM | POA: Diagnosis not present

## 2018-01-17 NOTE — Patient Instructions (Addendum)
1.  We will check a nerve study of the left upper and lower extremities. ELECTROMYOGRAM AND NERVE CONDUCTION STUDIES (EMG/NCS) INSTRUCTIONS  How to Prepare The neurologist conducting the EMG will need to know if you have certain medical conditions. Tell the neurologist and other EMG lab personnel if you: . Have a pacemaker or any other electrical medical device . Take blood-thinning medications . Have hemophilia, a blood-clotting disorder that causes prolonged bleeding Bathing Take a shower or bath shortly before your exam in order to remove oils from your skin. Don't apply lotions or creams before the exam.  What to Expect You'll likely be asked to change into a hospital gown for the procedure and lie down on an examination table. The following explanations can help you understand what will happen during the exam.  . Electrodes. The neurologist or a technician places surface electrodes at various locations on your skin depending on where you're experiencing symptoms. Or the neurologist may insert needle electrodes at different sites depending on your symptoms.  . Sensations. The electrodes will at times transmit a tiny electrical current that you may feel as a twinge or spasm. The needle electrode may cause discomfort or pain that usually ends shortly after the needle is removed. If you are concerned about discomfort or pain, you may want to talk to the neurologist about taking a short break during the exam.  . Instructions. During the needle EMG, the neurologist will assess whether there is any spontaneous electrical activity when the muscle is at rest - activity that isn't present in healthy muscle tissue - and the degree of activity when you slightly contract the muscle.  He or she will give you instructions on resting and contracting a muscle at appropriate times. Depending on what muscles and nerves the neurologist is examining, he or she may ask you to change positions during the exam.  After  your EMG You may experience some temporary, minor bruising where the needle electrode was inserted into your muscle. This bruising should fade within several days. If it persists, contact your primary care doctor.    2.  Further recommendations pending results.  Keep follow up appointment in February.

## 2018-01-19 ENCOUNTER — Other Ambulatory Visit: Payer: Self-pay | Admitting: *Deleted

## 2018-01-19 DIAGNOSIS — R531 Weakness: Secondary | ICD-10-CM

## 2018-01-20 ENCOUNTER — Telehealth: Payer: Self-pay

## 2018-01-20 ENCOUNTER — Ambulatory Visit (INDEPENDENT_AMBULATORY_CARE_PROVIDER_SITE_OTHER): Payer: No Typology Code available for payment source | Admitting: Neurology

## 2018-01-20 DIAGNOSIS — M5417 Radiculopathy, lumbosacral region: Secondary | ICD-10-CM

## 2018-01-20 DIAGNOSIS — R531 Weakness: Secondary | ICD-10-CM

## 2018-01-20 NOTE — Procedures (Signed)
[facility]  [place], [place]  [place], [place] [address] Tel: [phone] Fax:  [phone] Test Date:  01/20/2018  Patient: [name] DOB: 03-21-1970 Physician: Narda Amber, DO  Sex: Male Height: 5\' 9"  Ref Phys: Metta Clines, DO  ID#: [id number] Temp: 34.0C Technician:    Patient Complaints: This is a 47 year old man referred for evaluation of generalized pain, myalgias, and paresthesias.  NCV & EMG Findings: Electrodiagnostic testing of the right lower extremity and additional studies of the left shows: 1. Bilateral sural and superficial peroneal sensory responses are within normal limits.   2. Bilateral peroneal and tibial motor responses are within normal limits. 3. Bilateral tibial H reflex studies are within normal limits.   4. Chronic motor axonal loss changes are seen affecting bilateral L5 myotomes, without accompanied active denervation.     Impression: 1. Chronic L5 radiculopathy affecting bilateral lower extremities, mild in degree electrically.   2. There is no evidence of a large fiber sensorimotor polyneuropathy or diffuse myopathy.      ___________________________ Narda Amber, DO    Nerve Conduction Studies Anti Sensory Summary Table   Site NR Peak (ms) Norm Peak (ms) P-T Amp (V) Norm P-T Amp  Left Sup Peroneal Anti Sensory (Ant Lat Mall)  34C  12 cm    2.8 <4.5 7.3 >5  Right Sup Peroneal Anti Sensory (Ant Lat Mall)  34C  12 cm    2.2 <4.5 8.2 >5  Left Sural Anti Sensory (Lat Mall)  34C  Calf    3.3 <4.5 7.4 >5  Right Sural Anti Sensory (Lat Mall)  34C  Calf    2.8 <4.5 7.0 >5   Motor Summary Table   Site NR Onset (ms) Norm Onset (ms) O-P Amp (mV) Norm O-P Amp Site1 Site2 Delta-0 (ms) Dist (cm) Vel (m/s) Norm Vel (m/s)  Left Peroneal Motor (Ext Dig Brev)  34C  Ankle    3.6 <5.5 6.2 >3 B Fib Ankle 7.1 36.0 51 >40  B Fib    10.7  5.8  Poplt B Fib 1.3 8.0 62 >40  Poplt    12.0  5.7         Right Peroneal Motor (Ext Dig  Brev)  34C  Ankle    3.4 <5.5 6.5 >3 B Fib Ankle 7.4 40.0 54 >40  B Fib    10.8  6.5  Poplt B Fib 1.3 8.0 62 >40  Poplt    12.1  6.3         Left Tibial Motor (Abd Hall Brev)  34C  Ankle    3.8 <6.0 10.0 >8 Knee Ankle 8.2 37.0 45 >40  Knee    12.0  5.7         Right Tibial Motor (Abd Hall Brev)  34C  Ankle    5.1 <6.0 9.1 >8 Knee Ankle 8.2 41.0 50 >40  Knee    13.3  5.0          H Reflex Studies   NR H-Lat (ms) Lat Norm (ms) L-R H-Lat (ms)  Left Tibial (Gastroc)  34C     30.88 <35 0.00  Right Tibial (Gastroc)  34C     30.88 <35 0.00   EMG   Side Muscle Ins Act Fibs Psw Fasc Number Recrt Dur Dur. Amp Amp. Poly Poly. Comment  Left AntTibialis Nml Nml Nml Nml 1- Rapid Few 1+ Few 1+ Few 1+ N/A  Left Flex Dig Long Nml Nml Nml Nml 1-  Rapid Few 1+ Few 1+ Nml Nml N/A  Left Gastroc Nml Nml Nml Nml Nml Nml Nml Nml Nml Nml Nml Nml N/A  Left RectFemoris Nml Nml Nml Nml Nml Nml Nml Nml Nml Nml Nml Nml N/A  Left AdductorLong Nml Nml Nml Nml Nml Nml Nml Nml Nml Nml Nml Nml N/A  Left BicepsFemS Nml Nml Nml Nml Nml Nml Nml Nml Nml Nml Nml Nml N/A  Left GluteusMed Nml Nml Nml Nml Nml Nml Nml Nml Nml Nml Nml Nml N/A  Right AntTibialis Nml Nml Nml Nml 1- Rapid Few 1+ Few 1+ Nml Nml N/A  Right Gastroc Nml Nml Nml Nml Nml Nml Nml Nml Nml Nml Nml Nml N/A  Right Flex Dig Long Nml Nml Nml Nml 1- Rapid Few 1+ Few 1+ Nml Nml N/A  Right RectFemoris Nml Nml Nml Nml Nml Nml Nml Nml Nml Nml Nml Nml N/A  Right GluteusMed Nml Nml Nml Nml 1- Rapid Few 1+ Few 1+ Nml Nml N/A      Waveforms:

## 2018-01-20 NOTE — Telephone Encounter (Signed)
-----   Message from Pieter Partridge, DO sent at 01/20/2018  1:56 PM EST ----- The study was unremarkable.  No evidence of a polyneuropathy or muscle disorder.

## 2018-01-20 NOTE — Telephone Encounter (Signed)
Called and LMOVM advising Pt of EMG results and to call with any questions

## 2018-01-25 ENCOUNTER — Other Ambulatory Visit: Payer: Self-pay | Admitting: Family Medicine

## 2018-01-25 ENCOUNTER — Ambulatory Visit: Payer: No Typology Code available for payment source | Admitting: Neurology

## 2018-01-25 ENCOUNTER — Telehealth: Payer: No Typology Code available for payment source | Admitting: Physician Assistant

## 2018-01-25 ENCOUNTER — Encounter

## 2018-01-25 DIAGNOSIS — G5622 Lesion of ulnar nerve, left upper limb: Secondary | ICD-10-CM

## 2018-01-25 DIAGNOSIS — J00 Acute nasopharyngitis [common cold]: Secondary | ICD-10-CM

## 2018-01-25 DIAGNOSIS — G5603 Carpal tunnel syndrome, bilateral upper limbs: Secondary | ICD-10-CM

## 2018-01-25 MED ORDER — IPRATROPIUM BROMIDE 0.06 % NA SOLN: 2.0000 | Freq: Four times a day (QID) | NASAL | 12 refills | Status: DC

## 2018-01-25 MED FILL — IPRATROPIUM 0.06% SPRAY: 0.06 | 11 days supply | Qty: 15 | Fill #0

## 2018-01-25 MED FILL — NORTRIPTYLINE HCL 10 MG CAP: 10 | 30 days supply | Qty: 30 | Fill #2

## 2018-01-25 NOTE — Procedures (Signed)
[facility]  [place], [address]  [place], [place] [address] Tel: [phone] Fax:  [phone] Test Date:  01/25/2018  Patient: [name] DOB: October 29, 1970 Physician: Narda Amber, DO  Sex: Male Height: 5\' 9"  Ref Phys: Metta Clines, DO  ID#: [id number] Temp: 32.0C Technician:    Patient Complaints: This is a 47 year old man referred for evaluation of generalized pain, myalgias, and paresthesias.  NCV & EMG Findings: Extensive electrodiagnostic testing of the left upper extremity and additional studies of the right shows:  1. Bilateral median and left ulnar sensory responses show prolonged latency (L4.4, R3.9, L3.2 ms) and reduced amplitude (L11.4, R13.9, L8.7 V).  Right ulnar sensory responses within normal limits. 2. Left median motor response shows prolonged distal onset latency (4.2 ms).  Left ulnar motor response shows slowed conduction velocity across the elbow (A Elbow-B Elbow, 48 m/s).  Right median and ulnar motor responses are within normal limits.   3. Chronic motor axonal loss changes are isolated to the left first dorsal interosseous and abductor digit he minimi muscles.  There is no evidence of accompanied active denervation.    Impression: 1. Bilateral median neuropathy at or distal to the wrist, consistent with a clinical diagnosis of carpal tunnel syndrome.  Overall, these findings are moderate in degree electrically and worse on the left. 2. Left ulnar neuropathy with slowing across the elbow, predominantly demyelinating and moderate in degree electrically.   ___________________________ Narda Amber, DO    Nerve Conduction Studies Anti Sensory Summary Table   Site NR Peak (ms) Norm Peak (ms) P-T Amp (V) Norm P-T Amp  Left Median Anti Sensory (2nd Digit)  32C  Wrist    4.4 <3.4 11.4 >20  Right Median Anti Sensory (2nd Digit)  32C  Wrist    3.9 <3.4 13.9 >20  Left Ulnar Anti Sensory (5th Digit)  32C  Wrist    3.2 <3.1 8.7 >12  Right  Ulnar Anti Sensory (5th Digit)  32C  Wrist    2.8 <3.1 18.3 >12   Motor Summary Table   Site NR Onset (ms) Norm Onset (ms) O-P Amp (mV) Norm O-P Amp Site1 Site2 Delta-0 (ms) Dist (cm) Vel (m/s) Norm Vel (m/s)  Left Median Motor (Abd Poll Brev)  32C  Wrist    4.2 <3.9 10.8 >6 Elbow Wrist 5.3 29.0 55 >50  Elbow    9.5  10.5         Right Median Motor (Abd Poll Brev)  32C  Wrist    3.5 <3.9 12.5 >6 Elbow Wrist 5.4 29.0 54 >50  Elbow    8.9  12.3         Left Ulnar Motor (Abd Dig Minimi)  32C  Wrist    2.4 <3.1 9.5 >7 B Elbow Wrist 3.9 26.0 67 >50  B Elbow    6.3  9.2  A Elbow B Elbow 2.1 10.0 48 >50  A Elbow    8.4  9.0         Right Ulnar Motor (Abd Dig Minimi)  32C  Wrist    2.0 <3.1 8.6 >7 B Elbow Wrist 4.3 25.0 58 >50  B Elbow    6.3  8.3  A Elbow B Elbow 1.7 10.0 59 >50  A Elbow    8.0  8.1          EMG   Side Muscle Ins Act Fibs Psw Fasc Number Recrt Dur Dur. Amp Amp. Poly Poly. Comment  Right 1stDorInt  Nml Nml Nml Nml Nml Nml Nml Nml Nml Nml Nml Nml N/A  Right Abd Poll Brev Nml Nml Nml Nml Nml Nml Nml Nml Nml Nml Nml Nml N/A  Right PronatorTeres Nml Nml Nml Nml Nml Nml Nml Nml Nml Nml Nml Nml N/A  Right Biceps Nml Nml Nml Nml Nml Nml Nml Nml Nml Nml Nml Nml N/A  Right Triceps Nml Nml Nml Nml Nml Nml Nml Nml Nml Nml Nml Nml N/A  Right Deltoid Nml Nml Nml Nml Nml Nml Nml Nml Nml Nml Nml Nml N/A  Left 1stDorInt Nml Nml Nml Nml 1- Rapid Few 1+ Few 1+ Nml Nml N/A  Left Abd Poll Brev Nml Nml Nml Nml Nml Nml Nml Nml Nml Nml Nml Nml N/A  Left PronatorTeres Nml Nml Nml Nml Nml Nml Nml Nml Nml Nml Nml Nml N/A  Left Biceps Nml Nml Nml Nml Nml Nml Nml Nml Nml Nml Nml Nml N/A  Left Triceps Nml Nml Nml Nml Nml Nml Nml Nml Nml Nml Nml Nml N/A  Left Deltoid Nml Nml Nml Nml Nml Nml Nml Nml Nml Nml Nml Nml N/A  Left ABD Dig Min Nml Nml Nml Nml 1- Rapid Few 1+ Few 1+ Nml Nml N/A  Left FlexCarpiUln Nml Nml Nml Nml Nml Nml Nml Nml Nml Nml Nml Nml N/A      Waveforms:

## 2018-01-25 NOTE — Progress Notes (Signed)
We are sorry you are not feeling well.  Here is how we plan to help!  Based on what you have shared with me, it looks like you may have a viral upper respiratory infection or a "common cold".  Colds are caused by a large number of viruses; however, rhinovirus is the most common cause.   Symptoms of the common cold vary from person to person, with common symptoms including sore throat, cough, and malaise.  A low-grade fever of 100.4 may present, but is often uncommon.  Symptoms vary however, and are closely related to a person's age or underlying illnesses.  The most common symptoms associated with the common cold are nasal discharge or congestion, cough, sneezing, headache and pressure in the ears and face.  Cold symptoms usually persist for about 3 to 10 days, but can last up to 2 weeks.  It is important to know that colds do not cause serious illness or complications in most cases.    The common cold is transmitted from person to person, with the most common method of transmission being a person's hands.  The virus is able to live on the skin and can infect other persons for up to 2 hours after direct contact.  Also, colds are transmitted when someone coughs or sneezes; thus, it is important to cover the mouth to reduce this risk.  To keep the spread of the common cold at [place], good hand hygiene is very important.  This is an infection that is most likely caused by a virus. There are no specific treatments for the common cold other than to help you with the symptoms until the infection runs its course.    For nasal congestion, you may use an oral decongestants such as Mucinex D or if you have glaucoma or high blood pressure use plain Mucinex.  Saline nasal spray or nasal drops can help and can safely be used as often as needed for congestion.  For your congestion, I have prescribed Ipratropium Bromide nasal spray 0.03% two sprays in each nostril 2-3 times a day  If you do not have a history of heart  disease, hypertension, diabetes or thyroid disease, prostate/bladder issues or glaucoma, you may also use Sudafed to treat nasal congestion.  It is highly recommended that you consult with a pharmacist or your primary care physician to ensure this medication is safe for you to take.     If you have a cough, you may use cough suppressants such as Delsym and Robitussin.  If you have glaucoma or high blood pressure, you can also use Coricidin HBP.     If you have a sore or scratchy throat, use a saltwater gargle-  to  teaspoon of salt dissolved in a 4-ounce to 8-ounce glass of warm water.  Gargle the solution for approximately 15-30 seconds and then spit.  It is important not to swallow the solution.  You can also use throat lozenges/cough drops and Chloraseptic spray to help with throat pain or discomfort.  Warm or cold liquids can also be helpful in relieving throat pain.  For headache, pain or general discomfort, you can use Ibuprofen or Tylenol as directed.   Some authorities believe that zinc sprays or the use of Echinacea may shorten the course of your symptoms.   HOME CARE Only take medications as instructed by your medical team. Be sure to drink plenty of fluids. Water is fine as well as fruit juices, sodas and electrolyte beverages. You may want to  stay away from caffeine or alcohol. If you are nauseated, try taking small sips of liquids. How do you know if you are getting enough fluid? Your urine should be a pale yellow or almost colorless. Get rest. Taking a steamy shower or using a humidifier may help nasal congestion and ease sore throat pain. You can place a towel over your head and breathe in the steam from hot water coming from a faucet. Using a saline nasal spray works much the same way. Cough drops, hard candies and sore throat lozenges may ease your cough. Avoid close contacts especially the very young and the elderly Cover your mouth if you cough or sneeze Always remember to  wash your hands.   GET HELP RIGHT AWAY IF: You develop worsening fever. If your symptoms do not improve within 10 days You develop yellow or green discharge from your nose over 3 days. You have coughing fits You develop a severe head ache or visual changes. You develop shortness of breath, difficulty breathing or start having chest pain  Your symptoms persist after you have completed your treatment plan  MAKE SURE YOU  Understand these instructions. Will watch your condition. Will get help right away if you are not doing well or get worse.  Your e-visit answers were reviewed by a board certified advanced clinical practitioner to complete your personal care plan. Depending upon the condition, your plan could have included both over the counter or prescription medications. Please review your pharmacy choice. If there is a problem, you may call our nursing hot line at and have the prescription routed to another pharmacy. Your safety is important to [place]. If you have drug allergies check your prescription carefully.   You can use MyChart to ask questions about today's visit, request a non-urgent call back, or ask for a work or school excuse for 24 hours related to this e-Visit. If it has been greater than 24 hours you will need to follow up with your provider, or enter a new e-Visit to address those concerns. You will get an e-mail in the next two days asking about your experience.  I hope that your e-visit has been valuable and will speed your recovery. Thank you for using e-visits.      ===View-only below this line===   ----- Message -----    From: Junie Spencer    Sent: 01/25/2018  9:30 AM EST      To: E-Visit Mailing List Subject: E-Visit Submission: Sinus Problems  E-Visit Submission: Sinus Problems --------------------------------  Question: Which of the following have you been experiencing? Answer:   Congested nose            Pain around the nose and face             Headache            Throat pain  Question: Have these symptoms significantly worsened over the last two to three days? Answer:   Yes  Question: Describe your sore throat: Answer:   Sore in the mornings and throughout  the day but not burning  Question: How long have you had a sore throat? Answer:   3 days  Question: Do you have any tenderness or swelling in your neck? Answer:   Yes  Question: Have you had any of the following? Answer:   None of the above  Question: How long have you been having these symptoms? Answer:   3 days  Question: Do you have a fever? Answer:  I do not know  Question: Do you smoke? Answer:   No  Question: Have you ever smoked? Answer:   I have never smoked  Question: Do you have any chronic illnesses, such as diabetes, heart disease, kidney disease, or lung disease, or any illness that would weaken your body's ability to fight infection? Answer:   No  Question: When you blow your nose, what color is the mucus? Answer:   Mostly thick and yellow or green  Question: Have you experienced similar problems in the past? Answer:   Yes  Question: What treatments have worked in the past?  Answer:   Flonase             Z-pack  Question: What treatment(s) in the past have been unsuccessful? Answer:     Question: Is this illness similar to previous illnesses you have had?  How is it the same?  How is it different? Answer:   Yes it did the same  Question: Have you recently been hospitalized? Answer:   No  Question: What medications are you currently taking for these symptoms? Answer:   Decongestants            Pain medicine            Nose spray  Question: Please enter the names of any medications you are taking, or any other treatments you are trying. Answer:   OTC NOSE SPRAY             MUCINEX             TUSSIN DM  Question: Please list your medication allergies that you may have ? (If 'none' , please list as 'none') Answer:   NSAIDS             HYDROMORPHONE HCL            METOCLOPRAIDE HCL            DIPHENHYDRAMINE  Question: Please list any additional comments  Answer:

## 2018-01-27 MED FILL — FLUTICASONE PROP 50 MCG SPR: 50 | 30 days supply | Qty: 16 | Fill #1

## 2018-01-28 MED ORDER — VITAMIN D (ERGOCALCIFEROL) 1.25 MG (50000 UNIT) PO CAPS: 50000.0000 [IU] | ORAL_CAPSULE | ORAL | 0 refills | Status: DC

## 2018-01-31 MED ORDER — GABAPENTIN 100 MG PO CAPS: 100.0000 mg | ORAL_CAPSULE | Freq: Three times a day (TID) | ORAL | 5 refills | Status: DC

## 2018-01-31 NOTE — Progress Notes (Signed)
Pt walked into office to inquire about BUE EMG results. The following are Dr. Georgie Chard recommendations.  Both nerve conduction studies have been completed. There is no evidence of muscle disease or polyneuropathy. It does show evidence of possible mild carpal tunnel syndrome and pinching of nerve along the left elbow, but I believe those are incidental findings not causing any clinical symptoms. However, he can try wearing wrist splints at night and try not to lean on his elbows to see if it helps with numbness in his hands. The nerve study of the legs revealed evidence of mild nerve irritation in the back but nothing severe enough that would explain his symptoms (again, incidental finding)  Pt wanted to know what he could do about the pain, [name] suggested Gabapentin 100 mg TID. Pt requested I send that to [facility] pharmacy.

## 2018-02-01 ENCOUNTER — Ambulatory Visit (INDEPENDENT_AMBULATORY_CARE_PROVIDER_SITE_OTHER): Payer: No Typology Code available for payment source

## 2018-02-01 ENCOUNTER — Ambulatory Visit (INDEPENDENT_AMBULATORY_CARE_PROVIDER_SITE_OTHER): Payer: No Typology Code available for payment source | Admitting: Family Medicine

## 2018-02-01 ENCOUNTER — Encounter: Payer: Self-pay | Admitting: Family Medicine

## 2018-02-01 VITALS — BP 120/84 | HR 93 | Temp 98.4°F | Ht 69.0 in | Wt 205.8 lb

## 2018-02-01 DIAGNOSIS — M25571 Pain in right ankle and joints of right foot: Secondary | ICD-10-CM

## 2018-02-01 DIAGNOSIS — S8263XA Displaced fracture of lateral malleolus of unspecified fibula, initial encounter for closed fracture: Secondary | ICD-10-CM | POA: Insufficient documentation

## 2018-02-01 MED ORDER — TRAMADOL HCL 50 MG PO TABS: 50.0000 mg | ORAL_TABLET | Freq: Three times a day (TID) | ORAL | 0 refills | Status: DC | PRN

## 2018-02-01 NOTE — Assessment & Plan Note (Addendum)
Possible for high ankle sprain as he is unsure of the mechanism but it sounds to be an eversion injury.  Has significant swelling possible fracture. -X-ray today. -Placed in a boot. -Provided tramadol for pain as avoiding anti-inflammatories with history of ulcer. -We will follow-up in 1 week

## 2018-02-01 NOTE — Patient Instructions (Signed)
Nice to meet you  Please see me back in one week  I will call you with the results from today  Please use ice and compression  Happy new Year.

## 2018-02-01 NOTE — Progress Notes (Signed)
[name] - 46 y.o. male MRN [id number]  Date of birth: 1970-05-07  SUBJECTIVE:  Including CC & ROS.  Chief Complaint  Patient presents with  . Ankle Pain    right , twisted ankle last night     [name] is a 47 y.o. male that is presenting with right ankle pain.  He turned his ankle last night.  He reports having more of an eversion injury.  He is having pain over the lateral malleolus.  He is having significant swelling.  Minimal ecchymosis.  He has been limping secondary to pain.  He has been taking Tylenol.  He has a history of a gastric ulcer.  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  He has been getting worked up for this.  Pain is constant.  Pain is localized to the ankle.  Feels like the pain is throbbing.   Review of Systems  Constitutional: Negative for fever.  HENT: Negative for congestion.   Respiratory: Negative for cough.   Cardiovascular: Negative for chest pain.  Gastrointestinal: Negative for abdominal pain.  Musculoskeletal: Positive for gait problem and joint swelling.  Skin: Positive for color change.  Neurological: Negative for weakness.  Hematological: Negative for adenopathy.  Psychiatric/Behavioral: Negative for agitation.    HISTORY: Past Medical, Surgical, Social, and Family History Reviewed & Updated per EMR.   Pertinent Historical Findings include:  Past Medical History:  Diagnosis Date  . Anxiety    takes Xanax daily prn  . Chronic gastric ulcer with bleeding s/p suture repair 2012 09/29/2010   Head 3 EGD which failed to stop the bleeding. Had exploratory laparotomy on 10/04/2010. Possible cause of the gastric ulcer was NSAID use.   . Constipation    related to pain meds  . Dizziness    occasionally  . Erosive esophagitis   . External hemorrhoid, bleeding   . Gastric ulcer    history of  . GERD (gastroesophageal reflux disease)    takes Protonix bid  . Hiatal hernia   . History of blood clots 2012   in abdomen  .  History of blood transfusion 2012  . Incisional hernia s/p open repair w mesh Aug 2013 05/26/2011  . Joint pain    knees  . Nocturia    depends on amount of fluid he drinks  . Pneumonia   . Pre-diabetes   . Primary localized osteoarthritis of left hip 04/24/2014  . Serrated polyp of colon   . Sleep apnea    no cpap  mild no cpap needed  . Upper GI bleed   . Ventral hernia     Past Surgical History:  Procedure Laterality Date  . COLONOSCOPY    . ESOPHAGEAL MANOMETRY N/A 10/19/2016   Procedure: ESOPHAGEAL MANOMETRY (EM);  Surgeon: Mauri Pole, MD;  Location: [facility];  Service: Endoscopy;  Laterality: N/A;  . ESOPHAGOGASTRODUODENOSCOPY    . ESOPHAGOGASTRODUODENOSCOPY N/A 01/22/2017   Procedure: ESOPHAGOGASTRODUODENOSCOPY (EGD);  Surgeon: [name], MD;  Location: [facility];  Service: General;  Laterality: N/A;  . INCISIONAL HERNIA REPAIR  09/04/2011   Procedure: HERNIA REPAIR INCISIONAL;  Surgeon: Madilyn Hook, DO;  Location: [facility];  Service: General;  Laterality: N/A;  debridment calcified mass  . INSERTION OF MESH  09/04/2011   retrorectus ultrapro "30x30cm"  . INSERTION OF MESH N/A 01/22/2017   Procedure: INSERTION OF MESH;  Surgeon: [name], MD;  Location: [facility];  Service: General;  Laterality: N/A;  . NISSEN FUNDOPLICATION  Open 1990s  . POLYPECTOMY    . STOMACH SURGERY  10/05/2010   Oversewing of gastric ulcer.  Dr Brantley Stage  . TOTAL HIP ARTHROPLASTY Left 04/24/2014   Procedure: LEFT TOTAL HIP ARTHROPLASTY;  Surgeon: Marchia Bond, MD;  Location: [facility];  Service: Orthopedics;  Laterality: Left;  . UPPER GASTROINTESTINAL ENDOSCOPY      Allergies  Allergen Reactions  . Dilaudid [Hydromorphone Hcl] Rash and Other (See Comments)    Shakey  . Metoclopramide Hcl Hives, Swelling, Rash and Other (See Comments)    Swelling in lips  . Nsaids     H/o bleeding gastric ulcers  . Benadryl [Diphenhydramine]     Causes patient to become hyper and pace    Family  History  Problem Relation Age of Onset  . Diabetes Mother   . Liver disease Mother   . Diabetes Father   . Heart disease Father   . Colon cancer Neg Hx   . Stomach cancer Neg Hx   . Rectal cancer Neg Hx   . Esophageal cancer Neg Hx   . Colon polyps Neg Hx      Social History   Socioeconomic History  . Marital status: Single    Spouse name: Not on file  . Number of children: 1  . Years of education: 12  . Highest education level: High school graduate  Occupational History  . Occupation: Maintenance - [place]  Social Needs  . Financial resource strain: Not on file  . Food insecurity:    Worry: Not on file    Inability: Not on file  . Transportation needs:    Medical: Not on file    Non-medical: Not on file  Tobacco Use  . Smoking status: Never Smoker  . Smokeless tobacco: Current User    Types: Chew  . Tobacco comment: occ-Chew  Substance and Sexual Activity  . Alcohol use: Yes    Alcohol/week: 0.0 standard drinks    Comment: 09/04/11 "very seldom"  . Drug use: No  . Sexual activity: Yes  Lifestyle  . Physical activity:    Days per week: Not on file    Minutes per session: Not on file  . Stress: Not on file  Relationships  . Social connections:    Talks on phone: Not on file    Gets together: Not on file    Attends religious service: Not on file    Active member of club or organization: Not on file    Attends meetings of clubs or organizations: Not on file    Relationship status: Not on file  . Intimate partner violence:    Fear of current or ex partner: Not on file    Emotionally abused: Not on file    Physically abused: Not on file    Forced sexual activity: Not on file  Other Topics Concern  . Not on file  Social History Narrative   Lives with brother in a one story home.  Has one daughter.  Works in Theatre manager for [employer].  Education: high school.      PHYSICAL EXAM:  VS: BP 120/84   Pulse 93   Temp 98.4 F (36.9 C) (Oral)   Ht 5\' 9"   (1.753 m)   Wt 205 lb 12.8 oz (93.4 kg)   SpO2 98%   BMI 30.39 kg/m  Physical Exam Gen: NAD, alert, cooperative with exam, well-appearing ENT: normal lips, normal nasal mucosa,  Eye: normal EOM, normal conjunctiva and lids CV:  no  edema, +2 pedal pulses   Resp: no accessory muscle use, non-labored,  Skin: no rashes, no areas of induration  Neuro: normal tone, normal sensation to touch Psych:  normal insight, alert and oriented MSK:  Right ankle: Significantly swollen lateral malleolus and anterior aspect of the ankle. Limited range of motion secondary to pain. Tenderness to palpation over the lateral malleolus, medial malleolus. Limited strength secondary to pain. Neurovascular intact     ASSESSMENT & PLAN:   Acute right ankle pain Possible for high ankle sprain as he is unsure of the mechanism but it sounds to be an eversion injury.  Has significant swelling possible fracture. -X-ray today. -Placed in a boot. -Provided tramadol for pain as avoiding anti-inflammatories with history of ulcer. -We will follow-up in 1 week

## 2018-02-03 ENCOUNTER — Telehealth: Payer: Self-pay | Admitting: Family Medicine

## 2018-02-03 NOTE — Telephone Encounter (Signed)
Spoke with patient about xray results. Try to use crutches and has f/u scheduled.   Rosemarie Ax, MD [facility] 02/03/2018, 2:12 PM

## 2018-02-04 ENCOUNTER — Telehealth: Payer: Self-pay

## 2018-02-04 ENCOUNTER — Encounter: Payer: Self-pay | Admitting: Family Medicine

## 2018-02-04 NOTE — Telephone Encounter (Signed)
Patient already seen and tested since that message

## 2018-02-04 NOTE — Telephone Encounter (Signed)
TA-Plz see note below-Pt has been seen and tested since this/thx dmf

## 2018-02-04 NOTE — Telephone Encounter (Signed)
SB-Maybe you could help me with this mutual patient?  Dr. Deborra Medina sent a message to [name] on 12.13.19 stating:  [name], MD  Pieter Partridge, DO  Cc: Marrion Coy, CMA        Hi Adam,  I hope you are doing well. I am hoping you can help me with our mutual patient, [name]. I know that you know him well and had stopped his statin due to an elevation in his CK. His CK has actually increased and his symptoms are worsening. I am concerned about a neuromuscular disease. Do you have any ideas or thoughts? EMG? Should I advise him to come see you in your office or is there someone else you feel I should refer him to?  Thanks so much for all you do for our patients, and I hope you're having a great weekend,  Talia     I do not see that he has looked at it but you know how many things go in providers boxes/can you please take a peek at the CK trend and offer some insight? Plz advise/thx dmf

## 2018-02-08 ENCOUNTER — Encounter: Payer: Self-pay | Admitting: Family Medicine

## 2018-02-08 ENCOUNTER — Ambulatory Visit (INDEPENDENT_AMBULATORY_CARE_PROVIDER_SITE_OTHER): Payer: No Typology Code available for payment source | Admitting: Family Medicine

## 2018-02-08 ENCOUNTER — Encounter: Payer: No Typology Code available for payment source | Admitting: Neurology

## 2018-02-08 ENCOUNTER — Ambulatory Visit (INDEPENDENT_AMBULATORY_CARE_PROVIDER_SITE_OTHER): Payer: No Typology Code available for payment source

## 2018-02-08 ENCOUNTER — Other Ambulatory Visit: Payer: Self-pay | Admitting: Family Medicine

## 2018-02-08 VITALS — BP 128/78 | HR 125 | Temp 98.6°F | Ht 69.0 in | Wt 212.2 lb

## 2018-02-08 DIAGNOSIS — S8261XD Displaced fracture of lateral malleolus of right fibula, subsequent encounter for closed fracture with routine healing: Secondary | ICD-10-CM | POA: Diagnosis not present

## 2018-02-08 MED ORDER — TRAMADOL HCL 50 MG PO TABS: 50.0000 mg | ORAL_TABLET | Freq: Three times a day (TID) | ORAL | 0 refills | Status: DC | PRN

## 2018-02-08 MED FILL — traMADol HCL 50 MG TABS: 50 | 6 days supply | Qty: 20 | Fill #0

## 2018-02-08 MED FILL — ZOLPIDEM TART ER 12.5 MG TA: 12.5 | 30 days supply | Qty: 30 | Fill #1

## 2018-02-08 NOTE — Patient Instructions (Signed)
Good to see you  Please try to use the crutches to help with the pain  Please continue to ice and elevate  I will call you with the results from today.

## 2018-02-08 NOTE — Assessment & Plan Note (Signed)
Has been a cam walker and using crutches intermittently.  Previous x-ray demonstrated a mildly displaced lateral malleolus.   -X-ray -Provided crutches today -Tramadol -May need to consider referral to Ortho if displacement is worsening

## 2018-02-08 NOTE — Progress Notes (Signed)
[name] - 48 y.o. male MRN [id number]  Date of birth: Jun 27, 1970  SUBJECTIVE:  Including CC & ROS.  Chief Complaint  Patient presents with  . Follow-up    right ankle pain    [name] is a 48 y.o. male that is following up for his right ankle fracture.  He is been using a cam walker and crutches intermittently.  Still having bruising and swelling.  Pain is been significant.  Has taken tramadol for the pain.  Denies any numbness or tingling.  Has been off of work this week.    Review of Systems  Constitutional: Negative for fever.  HENT: Negative for congestion.   Respiratory: Negative for cough.   Cardiovascular: Negative for chest pain.  Gastrointestinal: Negative for abdominal pain.  Musculoskeletal: Positive for gait problem.  Skin: Positive for color change.  Neurological: Negative for weakness.  Hematological: Negative for adenopathy.  Psychiatric/Behavioral: Negative for agitation.    HISTORY: Past Medical, Surgical, Social, and Family History Reviewed & Updated per EMR.   Pertinent Historical Findings include:  Past Medical History:  Diagnosis Date  . Anxiety    takes Xanax daily prn  . Chronic gastric ulcer with bleeding s/p suture repair 2012 09/29/2010   Head 3 EGD which failed to stop the bleeding. Had exploratory laparotomy on 10/04/2010. Possible cause of the gastric ulcer was NSAID use.   . Constipation    related to pain meds  . Dizziness    occasionally  . Erosive esophagitis   . External hemorrhoid, bleeding   . Gastric ulcer    history of  . GERD (gastroesophageal reflux disease)    takes Protonix bid  . Hiatal hernia   . History of blood clots 2012   in abdomen  . History of blood transfusion 2012  . Incisional hernia s/p open repair w mesh Aug 2013 05/26/2011  . Joint pain    knees  . Nocturia    depends on amount of fluid he drinks  . Pneumonia   . Pre-diabetes   . Primary localized osteoarthritis of left hip 04/24/2014  . Serrated  polyp of colon   . Sleep apnea    no cpap  mild no cpap needed  . Upper GI bleed   . Ventral hernia     Past Surgical History:  Procedure Laterality Date  . COLONOSCOPY    . ESOPHAGEAL MANOMETRY N/A 10/19/2016   Procedure: ESOPHAGEAL MANOMETRY (EM);  Surgeon: Mauri Pole, MD;  Location: [facility];  Service: Endoscopy;  Laterality: N/A;  . ESOPHAGOGASTRODUODENOSCOPY    . ESOPHAGOGASTRODUODENOSCOPY N/A 01/22/2017   Procedure: ESOPHAGOGASTRODUODENOSCOPY (EGD);  Surgeon: [name], MD;  Location: [facility];  Service: General;  Laterality: N/A;  . INCISIONAL HERNIA REPAIR  09/04/2011   Procedure: HERNIA REPAIR INCISIONAL;  Surgeon: Madilyn Hook, DO;  Location: [place];  Service: General;  Laterality: N/A;  debridment calcified mass  . INSERTION OF MESH  09/04/2011   retrorectus ultrapro "30x30cm"  . INSERTION OF MESH N/A 01/22/2017   Procedure: INSERTION OF MESH;  Surgeon: [name], MD;  Location: [facility];  Service: General;  Laterality: N/A;  . NISSEN FUNDOPLICATION     Open 0865H  . POLYPECTOMY    . STOMACH SURGERY  10/05/2010   Oversewing of gastric ulcer.  Dr Brantley Stage  . TOTAL HIP ARTHROPLASTY Left 04/24/2014   Procedure: LEFT TOTAL HIP ARTHROPLASTY;  Surgeon: Marchia Bond, MD;  Location: [place];  Service: Orthopedics;  Laterality: Left;  .  UPPER GASTROINTESTINAL ENDOSCOPY      Allergies  Allergen Reactions  . Dilaudid [Hydromorphone Hcl] Rash and Other (See Comments)    Shakey  . Metoclopramide Hcl Hives, Swelling, Rash and Other (See Comments)    Swelling in lips  . Nsaids     H/o bleeding gastric ulcers  . Benadryl [Diphenhydramine]     Causes patient to become hyper and pace    Family History  Problem Relation Age of Onset  . Diabetes Mother   . Liver disease Mother   . Diabetes Father   . Heart disease Father   . Colon cancer Neg Hx   . Stomach cancer Neg Hx   . Rectal cancer Neg Hx   . Esophageal cancer Neg Hx   . Colon polyps Neg Hx      Social  History   Socioeconomic History  . Marital status: Single    Spouse name: Not on file  . Number of children: 1  . Years of education: 60  . Highest education level: High school graduate  Occupational History  . Occupation: Maintenance - [employer]  Social Needs  . Financial resource strain: Not on file  . Food insecurity:    Worry: Not on file    Inability: Not on file  . Transportation needs:    Medical: Not on file    Non-medical: Not on file  Tobacco Use  . Smoking status: Never Smoker  . Smokeless tobacco: Current User    Types: Chew  . Tobacco comment: occ-Chew  Substance and Sexual Activity  . Alcohol use: Yes    Alcohol/week: 0.0 standard drinks    Comment: 09/04/11 "very seldom"  . Drug use: No  . Sexual activity: Yes  Lifestyle  . Physical activity:    Days per week: Not on file    Minutes per session: Not on file  . Stress: Not on file  Relationships  . Social connections:    Talks on phone: Not on file    Gets together: Not on file    Attends religious service: Not on file    Active member of club or organization: Not on file    Attends meetings of clubs or organizations: Not on file    Relationship status: Not on file  . Intimate partner violence:    Fear of current or ex partner: Not on file    Emotionally abused: Not on file    Physically abused: Not on file    Forced sexual activity: Not on file  Other Topics Concern  . Not on file  Social History Narrative   Lives with brother in a one story home.  Has one daughter.  Works in Theatre manager for [employer].  Education: high school.      PHYSICAL EXAM:  VS: BP 128/78   Pulse (!) 125   Temp 98.6 F (37 C) (Oral)   Ht 5\' 9"  (1.753 m)   Wt 212 lb 3.2 oz (96.3 kg)   SpO2 97%   BMI 31.34 kg/m  Physical Exam Gen: NAD, alert, cooperative with exam, well-appearing ENT: normal lips, normal nasal mucosa,  Eye: normal EOM, normal conjunctiva and lids CV:  no edema, +2 pedal pulses   Resp: no  accessory muscle use, non-labored,  Skin: no rashes, no areas of induration  Neuro: normal tone, normal sensation to touch Psych:  normal insight, alert and oriented MSK:  Right ankle: Ecchymosis and swelling around the lateral malleolus Limited range of motion secondary to  pain. Tenderness palpation over the lateral malleolus Neurovascular intact     ASSESSMENT & PLAN:   Closed low lateral malleolus fracture Has been a cam walker and using crutches intermittently.  Previous x-ray demonstrated a mildly displaced lateral malleolus.   -X-ray -Provided crutches today -Tramadol -May need to consider referral to Ortho if displacement is worsening

## 2018-02-09 ENCOUNTER — Telehealth: Payer: Self-pay | Admitting: Family Medicine

## 2018-02-09 DIAGNOSIS — S8261XD Displaced fracture of lateral malleolus of right fibula, subsequent encounter for closed fracture with routine healing: Secondary | ICD-10-CM

## 2018-02-09 MED FILL — clonazePAM 0.5 MG TABS: 0.5 | 30 days supply | Qty: 30 | Fill #0

## 2018-02-09 NOTE — Telephone Encounter (Signed)
Spoke with patient. Will refer to [place].   Rosemarie Ax, MD [facility] 02/09/2018, 9:58 AM

## 2018-02-15 ENCOUNTER — Encounter: Payer: No Typology Code available for payment source | Admitting: Neurology

## 2018-02-17 ENCOUNTER — Encounter: Payer: Self-pay | Admitting: Family Medicine

## 2018-02-18 ENCOUNTER — Other Ambulatory Visit: Payer: Self-pay | Admitting: Family Medicine

## 2018-02-18 MED ORDER — TRAMADOL HCL 50 MG PO TABS: 50.0000 mg | ORAL_TABLET | Freq: Three times a day (TID) | ORAL | 0 refills | Status: DC | PRN

## 2018-02-22 MED ORDER — ONDANSETRON HCL 4 MG PO TABS: 4.0000 mg | ORAL_TABLET | Freq: Three times a day (TID) | ORAL | 0 refills | Status: DC | PRN

## 2018-03-07 MED FILL — FLUTICASONE PROP 50 MCG SPR: 50 | 30 days supply | Qty: 16 | Fill #2

## 2018-03-10 ENCOUNTER — Other Ambulatory Visit: Payer: Self-pay | Admitting: Family Medicine

## 2018-03-10 NOTE — Telephone Encounter (Signed)
TA-Pt last seen by you in December/has had several visits with JS/I have edited for #30 with 2 refills/plz advise/thx dmf

## 2018-03-15 NOTE — Progress Notes (Signed)
NEUROLOGY FOLLOW UP OFFICE NOTE  JOAB CARDEN [id number]  HISTORY OF PRESENT ILLNESS: [name] is a 48 year old man who follows up for stroke, headaches and myalgias.  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  Findings on testing thought to be incidental and not contributing to his symptoms.  For pain, he was starated on gabapentin 100mg  three times daily.  He now reports that the paresthesias and tightness sensation involves the left side of arm and torso and a little bit in the leg.  Repeat CK from yesterday was 149.  Also in December, he sustained right ankle fracture.  He is with a boot.  Headaches are every other day.  He takes Tylenol and headaches resolve after a few hours.  He takes Tylenol every other day.    HISTORY: He was admitted to [facility] from 10/05/2017 to 10/07/2017 after presenting with vertigo. 2 days prior, he began experiencing sensation of room spinning with right arm numbness, paresthesias and weakness and right leg paresthesiasassociated with headache and photophobia. MRI of the brain revealed a probable punctate acute/early subacute ischemic infarct within the left thalamo-mesencephalic junction. MRA of the head showed no aneurysm or large vessel occlusion or stenosis. Carotid Doppler showed no hemodynamically significant extracranial stenosis. 2D echo demonstrated normal ejection fraction with no cardiac source of emboli. LDL was 76. Hemoglobin A1c was 5.9. He was started on Lipitor 40mg  daily.He was discharged on aspirin 81 mg and Plavix 75 mg daily for 3 weeks, followed by aspirin alone.  Followingdischarge, he continued to have a constantsevereright-sided pressure-like  headache involving the right temple and right side of face associated with right sided facial numbness and tingling. He also noted worsening slurred speech, right upper extremity numbness and weakness. He developed new blurred peripheral vision in his right eye. He also endorsed postural lightheadedness when standing but not vertigo. He presented to the ED on 11/06/17 for further evaluation. Labs were unremarkable. CT of head demonstrated no acute findings. MRI of brain was normal with resolution of prior punctate focus. He was treated with a migraine cocktail and discharged. He had actually stopped the ASA and continued the Plavix.He reportedly had ran out of Plavix for several days. Once he restarted Plavix, the headache resolved. They now occur once a day for 2 hours or until he takes Tylenol.  Following the stroke, he developed numbness and tingling in both arms and legs for about a month He reports tightness and numbness and tingling in glove distribution up to elbows. He reports tightness between his shoulder blades but no significant neck pain or radicular pain down the arms. He also reports tightness and soreness in his hamstrings and his legs feel wobbly. He denies low back pain. It is a constant feeling. He reports aches and soreness in the arms and legs. Labs from 12/28/17 included TSH 2.30, B12 1,113, and elevated CK of 551.  He was advised to stop atorvastatin but symptoms have persisted.  Labs from 01/04/18 showed reduced CK of 306, CRP 0.1, vitamin D 29.69, negative Lyme IgG and IgM antibodies, and negative RMSF antibody.  Repeat CK  From 01/14/18 was mildly increased to 334.    PAST MEDICAL HISTORY: Past Medical History:  Diagnosis Date  . Anxiety  takes Xanax daily prn  . Chronic gastric ulcer with bleeding s/p suture repair 2012 09/29/2010   Head 3 EGD which failed to stop the bleeding. Had exploratory laparotomy on 10/04/2010. Possible cause of the gastric ulcer was  NSAID use.   . Constipation    related to pain meds  . Dizziness    occasionally  . Erosive esophagitis   . External hemorrhoid, bleeding   . Gastric ulcer    history of  . GERD (gastroesophageal reflux disease)    takes Protonix bid  . Hiatal hernia   . History of blood clots 2012   in abdomen  . History of blood transfusion 2012  . Incisional hernia s/p open repair w mesh Aug 2013 05/26/2011  . Joint pain    knees  . Nocturia    depends on amount of fluid he drinks  . Pneumonia   . Pre-diabetes   . Primary localized osteoarthritis of left hip 04/24/2014  . Serrated polyp of colon   . Sleep apnea    no cpap  mild no cpap needed  . Upper GI bleed   . Ventral hernia     MEDICATIONS: Current Outpatient Medications on File Prior to Visit  Medication Sig Dispense Refill  . acetaminophen (TYLENOL) 500 MG tablet Take 1,000 mg by mouth every 6 (six) hours as needed for mild pain or headache.    Marland Kitchen aspirin EC 81 MG EC tablet Take 1 tablet (81 mg total) by mouth daily.    . clonazePAM (KLONOPIN) 0.5 MG tablet TAKE 1 TABLET (0.5 MG TOTAL) BY MOUTH AT BEDTIME. 30 tablet 2  . gabapentin (NEURONTIN) 100 MG capsule Take 1 capsule (100 mg total) by mouth 3 (three) times daily. 90 capsule 5  . ipratropium (ATROVENT) 0.06 % nasal spray Place 2 sprays into both nostrils 4 (four) times daily. 15 mL 12  . nortriptyline (PAMELOR) 10 MG capsule Take 1 capsule (10 mg total) by mouth at bedtime. 30 capsule 3  . ondansetron (ZOFRAN) 4 MG tablet Take 1 tablet (4 mg total) by mouth every 8 (eight) hours as needed for nausea or vomiting. 20 tablet 0  . pantoprazole (PROTONIX) 40 MG tablet Take 1 tablet (40 mg total) by mouth 2 (two) times daily. 180 tablet 2  . traMADol (ULTRAM) 50 MG tablet Take 1 tablet (50 mg total) by mouth every 8 (eight) hours as needed. 20 tablet 0  . traZODone (DESYREL) 50 MG tablet Take 50 mg by mouth daily as needed.  3  . Vitamin D, Ergocalciferol, (DRISDOL) 1.25 MG (50000 UT)  CAPS capsule Take 1 capsule (50,000 Units total) by mouth every 7 (seven) days. On Monday 12 capsule 0  . zolpidem (AMBIEN CR) 12.5 MG CR tablet TAKE 1 TABLET (12.5 MG TOTAL) BY MOUTH AT BEDTIME AS NEEDED FOR SLEEP. 30 tablet 2   No current facility-administered medications on file prior to visit.     ALLERGIES: Allergies  Allergen Reactions  . Dilaudid [Hydromorphone Hcl] Rash and Other (See Comments)    Shakey  . Metoclopramide Hcl Hives, Swelling, Rash and Other (See Comments)    Swelling in lips  . Nsaids     H/o bleeding gastric ulcers  . Benadryl [Diphenhydramine]     Causes patient to become hyper and pace    FAMILY HISTORY: Family History  Problem Relation Age of Onset  . Diabetes Mother   . Liver disease Mother   . Diabetes Father   . Heart disease Father   .  Colon cancer Neg Hx   . Stomach cancer Neg Hx   . Rectal cancer Neg Hx   . Esophageal cancer Neg Hx   . Colon polyps Neg Hx    SOCIAL HISTORY: Social History   Socioeconomic History  . Marital status: Single    Spouse name: Not on file  . Number of children: 1  . Years of education: 11  . Highest education level: High school graduate  Occupational History  . Occupation: Maintenance - [employer]  Social Needs  . Financial resource strain: Not on file  . Food insecurity:    Worry: Not on file    Inability: Not on file  . Transportation needs:    Medical: Not on file    Non-medical: Not on file  Tobacco Use  . Smoking status: Never Smoker  . Smokeless tobacco: Current User    Types: Chew  . Tobacco comment: occ-Chew  Substance and Sexual Activity  . Alcohol use: Yes    Alcohol/week: 0.0 standard drinks    Comment: 09/04/11 "very seldom"  . Drug use: No  . Sexual activity: Yes  Lifestyle  . Physical activity:    Days per week: Not on file    Minutes per session: Not on file  . Stress: Not on file  Relationships  . Social connections:    Talks on phone: Not on file    Gets together: Not on  file    Attends religious service: Not on file    Active member of club or organization: Not on file    Attends meetings of clubs or organizations: Not on file    Relationship status: Not on file  . Intimate partner violence:    Fear of current or ex partner: Not on file    Emotionally abused: Not on file    Physically abused: Not on file    Forced sexual activity: Not on file  Other Topics Concern  . Not on file  Social History Narrative   Lives with brother in a one story home.  Has one daughter.  Works in Theatre manager for [employer].  Education: high school.     REVIEW OF SYSTEMS: Constitutional: No fevers, chills, or sweats, no generalized fatigue, change in appetite Eyes: No visual changes, double vision, eye pain Ear, nose and throat: No hearing loss, ear pain, nasal congestion, sore throat Cardiovascular: No chest pain, palpitations Respiratory:  No shortness of breath at rest or with exertion, wheezes GastrointestinaI: No nausea, vomiting, diarrhea, abdominal pain, fecal incontinence Genitourinary:  No dysuria, urinary retention or frequency Musculoskeletal:  No neck pain, back pain Integumentary: No rash, pruritus, skin lesions Neurological: as above Psychiatric: No depression, insomnia, anxiety Endocrine: No palpitations, fatigue, diaphoresis, mood swings, change in appetite, change in weight, increased thirst Hematologic/Lymphatic:  No purpura, petechiae. Allergic/Immunologic: no itchy/runny eyes, nasal congestion, recent allergic reactions, rashes  PHYSICAL EXAM: Blood pressure (!) 130/92, pulse (!) 107, height 5\' 9"  (1.753 m), weight 211 lb (95.7 kg), SpO2 91 %. General: No acute distress.  Patient appears well-groomed.  Head:  Normocephalic/atraumatic Eyes:  Fundi examined but not visualized Neck: supple, no paraspinal tenderness, full range of motion Heart:  Regular rate and rhythm Lungs:  Clear to auscultation bilaterally Back: No paraspinal  tenderness Neurological Exam: alert and oriented to person, place, and time. Attention span and concentration intact, recent and remote memory intact, fund of knowledge intact.  Speech fluent and not dysarthric, language intact.  CN II-XII intact. Bulk and tone normal,  muscle strength 5/5 throughout.  Sensation to light touch, temperature and vibration intact.  Deep tendon reflexes 2+ throughout, toes downgoing.  Finger to nose and heel to shin testing intact.  Gait normal, Romberg negative.  IMPRESSION: 1.  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 2.  Headache, probably tension-type, not intractable 3.  Myalgias.  Unclear etiology.  EMG did not demonstrated myopathy. Transient elevated CK may have been secondary to statin. 4.  Paresthesias.   Unclear etiology.  NCV did not demonstrate evidence of polyneuropathy.  B12, Lyme and thyroid labs normal.  Given involvement of torso and neck as well, we will check MRI of cervical spine to evaluate for any spinal cord involvement.  PLAN: 1.  ASA EC 81mg  daily for secondary stroke prevention. 2.  PCP may want to consider alternative statin or consider Repatha 3.  For headache prophylaxis, nortriptyline will be increased to 25mg  at bedtime 4.  For headache abortive therapy, acetaminophen with caffeine 5.  Limit use of pain relievers to no more than 2 days out of week to prevent risk of rebound or medication-overuse headache. 6.  Keep headache diary 7.  MRI of cervical spine with and without contrast 8.  Follow up in 5 months  Metta Clines, DO  CC: Arnette Norris, MD

## 2018-03-16 ENCOUNTER — Telehealth: Payer: Self-pay

## 2018-03-16 ENCOUNTER — Other Ambulatory Visit: Payer: Self-pay

## 2018-03-16 ENCOUNTER — Other Ambulatory Visit: Payer: Self-pay | Admitting: Family Medicine

## 2018-03-16 ENCOUNTER — Other Ambulatory Visit (INDEPENDENT_AMBULATORY_CARE_PROVIDER_SITE_OTHER): Payer: No Typology Code available for payment source

## 2018-03-16 DIAGNOSIS — R6889 Other general symptoms and signs: Secondary | ICD-10-CM

## 2018-03-16 LAB — CK: Total CK: 149 U/L (ref 7–232)

## 2018-03-16 MED ORDER — TRAMADOL HCL 50 MG PO TABS: 50.0000 mg | ORAL_TABLET | Freq: Three times a day (TID) | ORAL | 0 refills | Status: DC | PRN

## 2018-03-16 NOTE — Telephone Encounter (Signed)
LMOVM for Pt to call me back. [name] wants to check CK lab today for tomorrow's appt.

## 2018-03-16 NOTE — Progress Notes (Signed)
ck

## 2018-03-17 ENCOUNTER — Encounter: Payer: Self-pay | Admitting: Neurology

## 2018-03-17 ENCOUNTER — Ambulatory Visit (INDEPENDENT_AMBULATORY_CARE_PROVIDER_SITE_OTHER): Payer: No Typology Code available for payment source | Admitting: Neurology

## 2018-03-17 VITALS — BP 130/92 | HR 107 | Ht 69.0 in | Wt 211.0 lb

## 2018-03-17 DIAGNOSIS — R202 Paresthesia of skin: Secondary | ICD-10-CM | POA: Diagnosis not present

## 2018-03-17 DIAGNOSIS — I639 Cerebral infarction, unspecified: Secondary | ICD-10-CM | POA: Diagnosis not present

## 2018-03-17 DIAGNOSIS — M79602 Pain in left arm: Secondary | ICD-10-CM

## 2018-03-17 DIAGNOSIS — M79601 Pain in right arm: Secondary | ICD-10-CM

## 2018-03-17 DIAGNOSIS — R51 Headache: Secondary | ICD-10-CM | POA: Diagnosis not present

## 2018-03-17 DIAGNOSIS — R519 Headache, unspecified: Secondary | ICD-10-CM

## 2018-03-17 MED ORDER — DIAZEPAM 5 MG PO TABS: 5.0000 mg | ORAL_TABLET | Freq: Once | ORAL | 0 refills | Status: AC

## 2018-03-17 MED ORDER — NORTRIPTYLINE HCL 25 MG PO CAPS: 25.0000 mg | ORAL_CAPSULE | Freq: Every day | ORAL | 3 refills | Status: DC

## 2018-03-17 NOTE — Patient Instructions (Addendum)
1.  To help reduce headache, increase nortriptyline to 25mg  at bedtime 2.  For headache, take acetaminophen-caffeine pills but limit to no more than 2 days out of week to prevent rebound headache. 3.  Continue gabapentin 4.  We will check MRI of cervical spine with and without contrast 5.  Follow up in 5 months.  We have sent a referral to [facility] for your MRI and they will call you directly to schedule your appt. They are located at [place]. If you need to contact them directly please call [phone].

## 2018-03-24 ENCOUNTER — Telehealth: Payer: No Typology Code available for payment source | Admitting: Physician Assistant

## 2018-03-24 DIAGNOSIS — J069 Acute upper respiratory infection, unspecified: Secondary | ICD-10-CM

## 2018-03-24 DIAGNOSIS — M436 Torticollis: Secondary | ICD-10-CM

## 2018-03-24 NOTE — Progress Notes (Signed)
Based on what you shared with me it looks like you have a condition that should be evaluated in a face to face office visit. Symptoms of all seem consistent with a viral upper respiratory infection. However giving mention of next stiffness along with fever, you need an in person exam to make sure there's no inflammation in the lining around brain and spinal cord. Giving we are bot seeing you in person we have strict guidelines we must follow.  NOTE: If you entered your credit card information for this eVisit, you will not be charged. You may see a "hold" on your card for the $30 but that hold will drop off and you will not have a charge processed.  If you are having a true medical emergency please call 911.  If you need an urgent face to face visit, [facility] has four urgent care centers for your convenience.  If you need care fast and have a high deductible or no insurance consider:   [email or web address] to reserve your spot online an avoid wait times  [facility] [address], [address] [place], [place] [address] 8 am to 8 pm Monday-Friday 10 am to 4 pm Saturday-Sunday *Across the street from [employer]  [place], [address] 8 am to 5 pm Monday-Friday * In the [facility] on the [facility]   The following sites will take your  insurance:  . [facility] [place] a Provider at this Location  [address] [place], [place] [address] . 10 am to 8 pm Monday-Friday . 12 pm to 8 pm Saturday-Sunday   . [facility] at [place] a Provider at this Location  [place], [place], [place] [address] . 8 am to 8 pm Monday-Friday . 9 am to 6 pm Saturday . 11 am to 6 pm Sunday   . [facility] at No Name Get Driving Directions  [address] [place], [place] [address] . 8 am to 8 pm Monday-Friday . 8 am to 4 pm Saturday-Sunday   Your e-visit answers were reviewed by a board certified advanced clinical practitioner to complete your personal care plan.  Thank you for using e-Visits.

## 2018-03-25 ENCOUNTER — Telehealth: Payer: Self-pay | Admitting: Neurology

## 2018-03-25 MED ORDER — DIAZEPAM 5 MG PO TABS: 5.0000 mg | ORAL_TABLET | Freq: Once | ORAL | 0 refills | Status: DC

## 2018-03-25 NOTE — Telephone Encounter (Signed)
Patient is having his MRI on Sunday is needing the medication for the MRI sent to [facility] pharmacy. Thanks

## 2018-03-25 NOTE — Telephone Encounter (Signed)
Patient needs to have something called in to [facility] to help him relax during his MRI. He lost his that was called already

## 2018-03-25 NOTE — Telephone Encounter (Signed)
Called and advised Pt Rx sent in 

## 2018-03-27 ENCOUNTER — Ambulatory Visit
Admission: RE | Admit: 2018-03-27 | Discharge: 2018-03-27 | Disposition: A | Discharge status: Home / Self Care | Payer: No Typology Code available for payment source | Source: Ambulatory Visit | Attending: Neurology | Admitting: Neurology

## 2018-03-27 DIAGNOSIS — M79602 Pain in left arm: Principal | ICD-10-CM

## 2018-03-27 DIAGNOSIS — M79601 Pain in right arm: Secondary | ICD-10-CM

## 2018-03-27 DIAGNOSIS — R202 Paresthesia of skin: Principal | ICD-10-CM

## 2018-03-27 MED ORDER — GADOBENATE DIMEGLUMINE 529 MG/ML IV SOLN
20.0000 mL | Freq: Once | INTRAVENOUS | Status: AC | PRN
  Administered 2018-03-27: 20 mL via INTRAVENOUS

## 2018-03-28 ENCOUNTER — Telehealth: Payer: Self-pay

## 2018-03-28 NOTE — Telephone Encounter (Signed)
Pt came into the office, I advised him of results and I will be sending to [place] [facility] tomorrow.

## 2018-03-28 NOTE — Telephone Encounter (Signed)
-----   Message from Pieter Partridge, DO sent at 03/28/2018  7:47 AM EST ----- [name] does have a couple of disc bulges that are pressing into the spinal cord.  This may be causing his numbness and weakness.  I recommended that he see neurosurgery as soon as possible.

## 2018-03-31 ENCOUNTER — Other Ambulatory Visit: Payer: Self-pay | Admitting: Neurosurgery

## 2018-04-01 ENCOUNTER — Encounter: Payer: Self-pay | Admitting: Family Medicine

## 2018-04-03 HISTORY — PX: CARPAL TUNNEL RELEASE: SHX101

## 2018-04-04 NOTE — Pre-Procedure Instructions (Signed)
[name]  04/04/2018     Your procedure is scheduled on Friday, March 6.  Report to [facility], Main Entrance or Entrance "A" at 8:30 AM                       Your surgery or procedure is scheduled for 10:30A.M.   Call this number if you have problems the morning of surgery: [phone]  This is the number for the Pre- Surgical Desk.   Remember:  Do not eat or drink after midnight  Thursdy, March 5.   Take these medicines the morning of surgery with A SIP OF WATER : gabapentin (NEURONTIN)  pantoprazole (PROTONIX)  Take if needed: acetaminophen (TYLENOL) fluticasone (FLONASE) nasal spray ipratropium (ATROVENT) nasal spray ondansetron (ZOFRAN)  Aspirin per Dr Windy Carina instructions STOP taking  Aspirin Products (Goody Powder, Excedrin Migraine), Ibuprofen (Advil), Naproxen (Aleve), Vitamins and Herbal Products (ie Fish Oil).  Special instructions:  [place]- Preparing For Surgery  Before surgery, you can play an important role. Because skin is not sterile, your skin needs to be as free of germs as possible. You can reduce the number of germs on your skin by washing with CHG (chlorahexidine gluconate) Soap before surgery.  CHG is an antiseptic cleaner which kills germs and bonds with the skin to continue killing germs even after washing.    Oral Hygiene is also important to reduce your risk of infection.  Remember - BRUSH YOUR TEETH THE MORNING OF SURGERY WITH YOUR REGULAR TOOTHPASTE  Please do not use if you have an allergy to CHG or antibacterial soaps. If your skin becomes reddened/irritated stop using the CHG.  Do not shave (including legs and underarms) for at least 48 hours prior to first CHG shower. It is OK to shave your face.  Please follow these instructions carefully.   1. Shower the NIGHT BEFORE SURGERY and the MORNING OF SURGERY with CHG.   2. If you chose to wash your hair, wash your hair first as usual with your normal shampoo.  3. After you  shampoo, wash your face and private area with the soap you use at home, then rinse your hair and body thoroughly to remove the shampoo and soap.  4. Use CHG as you would any other liquid soap. You can apply CHG directly to the skin and wash gently with a scrungie or a clean washcloth.   5. Apply the CHG Soap to your body ONLY FROM THE NECK DOWN.  Do not use on open wounds or open sores. Avoid contact with your eyes, ears, mouth and genitals (private parts).   6. Wash thoroughly, paying special attention to the area where your surgery will be performed.  7. Thoroughly rinse your body with warm water from the neck down.  8. DO NOT shower/wash with your normal soap after using and rinsing off the CHG Soap.  9. Pat yourself dry with a CLEAN TOWEL.  10. Wear CLEAN PAJAMAS to bed the night before surgery, wear comfortable clothes the morning of surgery  11. Place CLEAN SHEETS on your bed the night of your first shower and DO NOT SLEEP WITH PETS.  Day of Surgery: Shower as written above  Do not apply any deodorants/lotions, powders or colognes.  Please wear clean clothes to the hospital/surgery center.   Remember to brush your teeth WITH YOUR REGULAR TOOTHPASTE.  Do not wear jewelry, make-up or nail polish.  Do not shave 48 hours  prior to surgery.  Men may shave face and neck.  Do not bring valuables to the hospital.  [facility] is not responsible for any belongings or valuables.  Contacts, dentures or bridgework may not be worn into surgery.  Leave your suitcase in the car.  After surgery it may be brought to your room.  For patients admitted to the hospital, discharge time will be determined by your treatment team.  Patients discharged the day of surgery will not be allowed to drive home.   Please read over the following fact sheets that you were given:  Managing Pain, Surgical Site Infection, Coughing and Deep Breathing

## 2018-04-05 ENCOUNTER — Encounter (HOSPITAL_COMMUNITY): Payer: Self-pay

## 2018-04-05 ENCOUNTER — Encounter (HOSPITAL_COMMUNITY)
Admission: RE | Admit: 2018-04-05 | Discharge: 2018-04-05 | Disposition: A | Discharge status: Home / Self Care | Payer: No Typology Code available for payment source | Source: Ambulatory Visit | Attending: Neurosurgery | Admitting: Neurosurgery

## 2018-04-05 ENCOUNTER — Other Ambulatory Visit: Payer: Self-pay

## 2018-04-05 DIAGNOSIS — Z01812 Encounter for preprocedural laboratory examination: Secondary | ICD-10-CM

## 2018-04-05 HISTORY — DX: Cerebral infarction, unspecified: I63.9

## 2018-04-05 HISTORY — DX: Depression, unspecified: F32.A

## 2018-04-05 HISTORY — DX: Dyspnea, unspecified: R06.00

## 2018-04-05 HISTORY — DX: Major depressive disorder, single episode, unspecified: F32.9

## 2018-04-05 LAB — CBC
HCT: 44 % (ref 39.0–52.0)
Hemoglobin: 14.2 g/dL (ref 13.0–17.0)
MCH: 29.9 pg (ref 26.0–34.0)
MCHC: 32.3 g/dL (ref 30.0–36.0)
MCV: 92.6 fL (ref 80.0–100.0)
Platelets: 347 10*3/uL (ref 150–400)
RBC: 4.75 MIL/uL (ref 4.22–5.81)
RDW: 12.8 % (ref 11.5–15.5)
WBC: 10 10*3/uL (ref 4.0–10.5)
nRBC: 0 % (ref 0.0–0.2)

## 2018-04-05 LAB — BASIC METABOLIC PANEL WITH GFR
Anion gap: 10 (ref 5–15)
BUN: 16 mg/dL (ref 6–20)
CO2: 20 mmol/L — ABNORMAL LOW (ref 22–32)
Calcium: 9.4 mg/dL (ref 8.9–10.3)
Chloride: 106 mmol/L (ref 98–111)
Creatinine, Ser: 0.91 mg/dL (ref 0.61–1.24)
GFR calc Af Amer: >60 mL/min
GFR calc non Af Amer: >60 mL/min
Glucose, Bld: 95 mg/dL (ref 70–99)
Potassium: 5.1 mmol/L (ref 3.5–5.1)
Sodium: 136 mmol/L (ref 135–145)

## 2018-04-05 LAB — SURGICAL PCR SCREEN
MRSA, PCR: NEGATIVE
Staphylococcus aureus: NEGATIVE

## 2018-04-05 NOTE — Progress Notes (Addendum)
PCP -  Dr. Bjorn Loser  Neurologist - Dr A. Caffe  Cardiologist - na   Chest x-ray - na  EKG - 11/07/2017  Stress Test - na  ECHO - 10/2016  Cardiac Cath - no  Sleep Study - 2016-   CPAP - no LABS- CBC, BMP  ASA-81mg - patient stopped 03/27/2018  HA1C-na Fasting Blood Sugar - 0 Checks Blood Sugar ___0__ times a day  Anesthesia- [name] had a stroke 10/2017 Patient was on Aspirin and Plavix for 3 months, now only on Aspirin 81 mg.  I called [name] to see if they stop Aspirin prior to OR and if they got in toch with [name]- patient's neurologist, she said they stop Aspirin 5 -7 days prior to surgery and no they did not get the ok with [name], she instructed patient to call [name].  [name] stopped Aspirin 03/27/2018 in anticipation that he would have surgery. I called and spoke with [name] on speaker phone with patient in the office.  I infomed [name] that patient is having surgery on Friday and he has stopped Aspirin, [name] asked if I was calling to get permission after the fact.  I said I was calling to inform him that  Aspirin was  stopped- 03/27/2018- by patient  and see if he is ok with patient to continue holding the Aspirin and have surgery on 04/08/2018.  Dr [name] said he may have surgery on Friday, 04/08/2018. [name] was on Atorvastin, it was discontinued in December CK,Total was 551 continued to decrease in December, was 149 on 03/17/2018. [name] is leaving it up to PCP to start a different statin. Pt denies having chest pain, sob, or  fever at this time. All instructions explained to the pt, with a verbal understanding of the material. Pt agrees to go over the instructions while at home for a better understanding. The opportunity to ask questions was provided.

## 2018-04-06 NOTE — Anesthesia Preprocedure Evaluation (Addendum)
Anesthesia Evaluation  Patient identified by MRN, date of birth, ID band Patient awake    Reviewed: Allergy & Precautions, NPO status , Patient's Chart, lab work & pertinent test results  Airway Mallampati: II  TM Distance: >3 FB Neck ROM: Full    Dental no notable dental hx. (+) Teeth Intact, Dental Advisory Given   Pulmonary sleep apnea , pneumonia,    Pulmonary exam normal breath sounds clear to auscultation       Cardiovascular Normal cardiovascular exam Rhythm:Regular Rate:Normal     Neuro/Psych  Headaches, PSYCHIATRIC DISORDERS Depression CVA    GI/Hepatic hiatal hernia, PUD, GERD  ,  Endo/Other    Renal/GU      Musculoskeletal   Abdominal   Peds  Hematology   Anesthesia Other Findings   Reproductive/Obstetrics                           Anesthesia Physical Anesthesia Plan  ASA: III  Anesthesia Plan: General   Post-op Pain Management:    Induction: Intravenous  PONV Risk Score and Plan: 4 or greater and Treatment may vary due to age or medical condition, Ondansetron and Dexamethasone  Airway Management Planned: Oral ETT and Video Laryngoscope Planned  Additional Equipment:   Intra-op Plan:   Post-operative Plan: Extubation in OR  Informed Consent: I have reviewed the patients History and Physical, chart, labs and discussed the procedure including the risks, benefits and alternatives for the proposed anesthesia with the patient or authorized representative who has indicated his/her understanding and acceptance.     Dental advisory given  Plan Discussed with:   Anesthesia Plan Comments: (Pt hospitalized 10/2017 for stroke. MRI of the brain revealed a probable punctate acute/early subacute ischemic infarct within the left thalamo-mesencephalic junction.  MRA of the head showed no aneurysm or large vessel occlusion or stenosis.  Carotid Doppler showed no hemodynamically  significant extracranial stenosis.  Echo demonstrated EF 55-60%, grade 2 dd, with no cardiac source of emboli. He now follows with neurology, [name]. PAT nurse Janelle Floor called [name] to clarify instructions for anticoagulant meds. Per her note, [name] was okay with instructions given by Neurosurgery and said it was okay for pt to proceed with surgery.)       Anesthesia Quick Evaluation

## 2018-04-08 ENCOUNTER — Other Ambulatory Visit: Payer: Self-pay

## 2018-04-08 ENCOUNTER — Inpatient Hospital Stay (HOSPITAL_COMMUNITY): Payer: No Typology Code available for payment source | Admitting: Certified Registered Nurse Anesthetist

## 2018-04-08 ENCOUNTER — Inpatient Hospital Stay (HOSPITAL_COMMUNITY)
Admission: RE | Admit: 2018-04-08 | Discharge: 2018-04-09 | DRG: 472 | Disposition: A | Discharge status: Home / Self Care | Payer: No Typology Code available for payment source | Attending: Neurosurgery | Admitting: Neurosurgery

## 2018-04-08 ENCOUNTER — Inpatient Hospital Stay (HOSPITAL_COMMUNITY): Payer: No Typology Code available for payment source

## 2018-04-08 ENCOUNTER — Inpatient Hospital Stay (HOSPITAL_COMMUNITY): Payer: No Typology Code available for payment source | Admitting: Physician Assistant

## 2018-04-08 ENCOUNTER — Encounter (HOSPITAL_COMMUNITY): Payer: Self-pay | Admitting: *Deleted

## 2018-04-08 ENCOUNTER — Encounter (HOSPITAL_COMMUNITY)
Admission: RE | Disposition: A | Discharge status: Home / Self Care | Payer: Self-pay | Source: Home / Self Care | Attending: Neurosurgery

## 2018-04-08 DIAGNOSIS — M4802 Spinal stenosis, cervical region: Secondary | ICD-10-CM | POA: Diagnosis present

## 2018-04-08 DIAGNOSIS — Z833 Family history of diabetes mellitus: Secondary | ICD-10-CM | POA: Diagnosis not present

## 2018-04-08 DIAGNOSIS — K644 Residual hemorrhoidal skin tags: Secondary | ICD-10-CM | POA: Diagnosis present

## 2018-04-08 DIAGNOSIS — F329 Major depressive disorder, single episode, unspecified: Secondary | ICD-10-CM | POA: Diagnosis present

## 2018-04-08 DIAGNOSIS — Z86010 Personal history of colonic polyps: Secondary | ICD-10-CM | POA: Diagnosis not present

## 2018-04-08 DIAGNOSIS — F410 Panic disorder [episodic paroxysmal anxiety] without agoraphobia: Secondary | ICD-10-CM | POA: Diagnosis present

## 2018-04-08 DIAGNOSIS — Z8673 Personal history of transient ischemic attack (TIA), and cerebral infarction without residual deficits: Secondary | ICD-10-CM | POA: Diagnosis not present

## 2018-04-08 DIAGNOSIS — R7303 Prediabetes: Secondary | ICD-10-CM | POA: Diagnosis present

## 2018-04-08 DIAGNOSIS — Z8249 Family history of ischemic heart disease and other diseases of the circulatory system: Secondary | ICD-10-CM | POA: Diagnosis not present

## 2018-04-08 DIAGNOSIS — K219 Gastro-esophageal reflux disease without esophagitis: Secondary | ICD-10-CM | POA: Diagnosis present

## 2018-04-08 DIAGNOSIS — M25562 Pain in left knee: Secondary | ICD-10-CM | POA: Diagnosis present

## 2018-04-08 DIAGNOSIS — Z7982 Long term (current) use of aspirin: Secondary | ICD-10-CM

## 2018-04-08 DIAGNOSIS — Z86718 Personal history of other venous thrombosis and embolism: Secondary | ICD-10-CM | POA: Diagnosis not present

## 2018-04-08 DIAGNOSIS — M50023 Cervical disc disorder at C6-C7 level with myelopathy: Secondary | ICD-10-CM | POA: Diagnosis present

## 2018-04-08 DIAGNOSIS — Z8719 Personal history of other diseases of the digestive system: Secondary | ICD-10-CM

## 2018-04-08 DIAGNOSIS — Z885 Allergy status to narcotic agent status: Secondary | ICD-10-CM

## 2018-04-08 DIAGNOSIS — Z96642 Presence of left artificial hip joint: Secondary | ICD-10-CM | POA: Diagnosis present

## 2018-04-08 DIAGNOSIS — M25561 Pain in right knee: Secondary | ICD-10-CM | POA: Diagnosis present

## 2018-04-08 DIAGNOSIS — Z886 Allergy status to analgesic agent status: Secondary | ICD-10-CM

## 2018-04-08 DIAGNOSIS — M4712 Other spondylosis with myelopathy, cervical region: Secondary | ICD-10-CM | POA: Diagnosis present

## 2018-04-08 DIAGNOSIS — Z888 Allergy status to other drugs, medicaments and biological substances status: Secondary | ICD-10-CM | POA: Diagnosis not present

## 2018-04-08 DIAGNOSIS — Z8701 Personal history of pneumonia (recurrent): Secondary | ICD-10-CM | POA: Diagnosis not present

## 2018-04-08 DIAGNOSIS — Z8711 Personal history of peptic ulcer disease: Secondary | ICD-10-CM

## 2018-04-08 DIAGNOSIS — G959 Disease of spinal cord, unspecified: Secondary | ICD-10-CM | POA: Diagnosis present

## 2018-04-08 DIAGNOSIS — K5903 Drug induced constipation: Secondary | ICD-10-CM | POA: Diagnosis present

## 2018-04-08 DIAGNOSIS — Z79899 Other long term (current) drug therapy: Secondary | ICD-10-CM

## 2018-04-08 DIAGNOSIS — G4730 Sleep apnea, unspecified: Secondary | ICD-10-CM | POA: Diagnosis present

## 2018-04-08 HISTORY — PX: ANTERIOR CERVICAL DECOMPRESSION/DISCECTOMY FUSION 4 LEVELS: SHX5556

## 2018-04-08 SURGERY — ANTERIOR CERVICAL DECOMPRESSION/DISCECTOMY FUSION 4 LEVELS
Anesthesia: General

## 2018-04-08 MED ORDER — ACETAMINOPHEN 500 MG PO TABS: 1000.0000 mg | ORAL_TABLET | Freq: Four times a day (QID) | ORAL | Status: DC | PRN

## 2018-04-08 MED ORDER — FLUTICASONE PROPIONATE 50 MCG/ACT NA SUSP: 2.0000 | Freq: Every day | NASAL | Status: DC | PRN

## 2018-04-08 MED ORDER — PROPOFOL 10 MG/ML IV BOLUS
INTRAVENOUS | Status: DC | PRN
  Administered 2018-04-08: 150 mg via INTRAVENOUS

## 2018-04-08 MED ORDER — LACTATED RINGERS IV SOLN
INTRAVENOUS | Status: DC
  Administered 2018-04-08: 09:00:00 via INTRAVENOUS

## 2018-04-08 MED ORDER — LIDOCAINE 2% (20 MG/ML) 5 ML SYRINGE
INTRAMUSCULAR | Status: AC
  Filled 2018-04-08: qty 10

## 2018-04-08 MED ORDER — ASPIRIN EC 81 MG PO TBEC
81.0000 mg | DELAYED_RELEASE_TABLET | Freq: Every day | ORAL | Status: DC
  Administered 2018-04-08 – 2018-04-09 (×2): 81 mg via ORAL
  Filled 2018-04-08 (×2): qty 1

## 2018-04-08 MED ORDER — GABAPENTIN 100 MG PO CAPS
100.0000 mg | ORAL_CAPSULE | Freq: Three times a day (TID) | ORAL | Status: DC
  Administered 2018-04-08 – 2018-04-09 (×3): 100 mg via ORAL
  Filled 2018-04-08 (×3): qty 1

## 2018-04-08 MED ORDER — ONDANSETRON HCL 4 MG/2ML IJ SOLN: 4.0000 mg | Freq: Four times a day (QID) | INTRAMUSCULAR | Status: DC | PRN

## 2018-04-08 MED ORDER — MORPHINE SULFATE (PF) 2 MG/ML IV SOLN
INTRAVENOUS | Status: AC
  Administered 2018-04-08: 2 mg via INTRAVENOUS
  Filled 2018-04-08: qty 1

## 2018-04-08 MED ORDER — MIDAZOLAM HCL 2 MG/2ML IJ SOLN
INTRAMUSCULAR | Status: AC
  Filled 2018-04-08: qty 2

## 2018-04-08 MED ORDER — OXYCODONE HCL 5 MG PO TABS
ORAL_TABLET | ORAL | Status: AC
  Administered 2018-04-08: 10 mg via ORAL
  Filled 2018-04-08: qty 1

## 2018-04-08 MED ORDER — DEXAMETHASONE SODIUM PHOSPHATE 4 MG/ML IJ SOLN
4.0000 mg | Freq: Four times a day (QID) | INTRAMUSCULAR | Status: DC
  Administered 2018-04-08: 4 mg via INTRAVENOUS
  Filled 2018-04-08: qty 1

## 2018-04-08 MED ORDER — OXYCODONE HCL 5 MG PO TABS
10.0000 mg | ORAL_TABLET | ORAL | Status: DC | PRN
  Administered 2018-04-08 – 2018-04-09 (×6): 10 mg via ORAL
  Filled 2018-04-08 (×5): qty 2

## 2018-04-08 MED ORDER — SODIUM CHLORIDE 0.9 % IV SOLN: 250.0000 mL | INTRAVENOUS | Status: DC

## 2018-04-08 MED ORDER — LACTATED RINGERS IV SOLN
INTRAVENOUS | Status: DC | PRN
  Administered 2018-04-08 (×2): via INTRAVENOUS

## 2018-04-08 MED ORDER — ACETAMINOPHEN 650 MG RE SUPP: 650.0000 mg | RECTAL | Status: DC | PRN

## 2018-04-08 MED ORDER — PHENYLEPHRINE HCL 10 MG/ML IJ SOLN
INTRAMUSCULAR | Status: DC | PRN
  Administered 2018-04-08: 120 ug via INTRAVENOUS
  Administered 2018-04-08: 200 ug via INTRAVENOUS

## 2018-04-08 MED ORDER — CYCLOBENZAPRINE HCL 10 MG PO TABS
ORAL_TABLET | ORAL | Status: AC
  Administered 2018-04-08: 10 mg via ORAL
  Filled 2018-04-08: qty 1

## 2018-04-08 MED ORDER — DEXAMETHASONE SODIUM PHOSPHATE 10 MG/ML IJ SOLN
INTRAMUSCULAR | Status: AC
  Filled 2018-04-08: qty 1

## 2018-04-08 MED ORDER — DEXAMETHASONE 4 MG PO TABS
4.0000 mg | ORAL_TABLET | Freq: Four times a day (QID) | ORAL | Status: DC
  Administered 2018-04-08 – 2018-04-09 (×2): 4 mg via ORAL
  Filled 2018-04-08 (×2): qty 1

## 2018-04-08 MED ORDER — KETOROLAC TROMETHAMINE 0.5 % OP SOLN
OPHTHALMIC | Status: AC
  Administered 2018-04-08: 1 [drp] via OPHTHALMIC
  Filled 2018-04-08: qty 5

## 2018-04-08 MED ORDER — PROPOFOL 10 MG/ML IV BOLUS
INTRAVENOUS | Status: AC
  Filled 2018-04-08: qty 20

## 2018-04-08 MED ORDER — CHLORHEXIDINE GLUCONATE CLOTH 2 % EX PADS: 6.0000 | MEDICATED_PAD | Freq: Once | CUTANEOUS | Status: DC

## 2018-04-08 MED ORDER — PHENOL 1.4 % MT LIQD: 1.0000 | OROMUCOSAL | Status: DC | PRN

## 2018-04-08 MED ORDER — POLYMYXIN B-TRIMETHOPRIM 10000-0.1 UNIT/ML-% OP SOLN: 1.0000 [drp] | Freq: Three times a day (TID) | OPHTHALMIC | Status: DC

## 2018-04-08 MED ORDER — SUGAMMADEX SODIUM 200 MG/2ML IV SOLN
INTRAVENOUS | Status: DC | PRN
  Administered 2018-04-08: 200 mg via INTRAVENOUS

## 2018-04-08 MED ORDER — MENTHOL 3 MG MT LOZG: 1.0000 | LOZENGE | OROMUCOSAL | Status: DC | PRN

## 2018-04-08 MED ORDER — DEXAMETHASONE SODIUM PHOSPHATE 10 MG/ML IJ SOLN
10.0000 mg | INTRAMUSCULAR | Status: AC
  Administered 2018-04-08: 10 mg via INTRAVENOUS

## 2018-04-08 MED ORDER — FENTANYL CITRATE (PF) 100 MCG/2ML IJ SOLN
INTRAMUSCULAR | Status: DC | PRN
  Administered 2018-04-08: 150 ug via INTRAVENOUS
  Administered 2018-04-08 (×3): 50 ug via INTRAVENOUS

## 2018-04-08 MED ORDER — ONDANSETRON HCL 4 MG PO TABS: 4.0000 mg | ORAL_TABLET | Freq: Four times a day (QID) | ORAL | Status: DC | PRN

## 2018-04-08 MED ORDER — BSS IO SOLN
INTRAOCULAR | Status: AC
  Filled 2018-04-08: qty 15

## 2018-04-08 MED ORDER — ONDANSETRON HCL 4 MG/2ML IJ SOLN
INTRAMUSCULAR | Status: AC
  Filled 2018-04-08: qty 2

## 2018-04-08 MED ORDER — CEFAZOLIN SODIUM-DEXTROSE 2-4 GM/100ML-% IV SOLN
INTRAVENOUS | Status: AC
  Filled 2018-04-08: qty 100

## 2018-04-08 MED ORDER — MEPERIDINE HCL 50 MG/ML IJ SOLN: 6.2500 mg | INTRAMUSCULAR | Status: DC | PRN

## 2018-04-08 MED ORDER — THROMBIN 5000 UNITS EX SOLR
OROMUCOSAL | Status: DC | PRN
  Administered 2018-04-08: 11:00:00 via TOPICAL

## 2018-04-08 MED ORDER — THROMBIN 5000 UNITS EX SOLR
CUTANEOUS | Status: AC
  Filled 2018-04-08: qty 5000

## 2018-04-08 MED ORDER — 0.9 % SODIUM CHLORIDE (POUR BTL) OPTIME
TOPICAL | Status: DC | PRN
  Administered 2018-04-08: 1000 mL

## 2018-04-08 MED ORDER — CYCLOBENZAPRINE HCL 10 MG PO TABS
10.0000 mg | ORAL_TABLET | Freq: Three times a day (TID) | ORAL | Status: DC | PRN
  Administered 2018-04-08 – 2018-04-09 (×2): 10 mg via ORAL
  Filled 2018-04-08: qty 1

## 2018-04-08 MED ORDER — HYDROCODONE-ACETAMINOPHEN 7.5-325 MG PO TABS: 1.0000 | ORAL_TABLET | Freq: Once | ORAL | Status: DC | PRN

## 2018-04-08 MED ORDER — CEFAZOLIN SODIUM-DEXTROSE 2-4 GM/100ML-% IV SOLN
2.0000 g | Freq: Three times a day (TID) | INTRAVENOUS | Status: AC
  Administered 2018-04-08 – 2018-04-09 (×2): 2 g via INTRAVENOUS
  Filled 2018-04-08 (×2): qty 100

## 2018-04-08 MED ORDER — GABAPENTIN 100 MG PO CAPS
200.0000 mg | ORAL_CAPSULE | Freq: Once | ORAL | Status: AC
  Administered 2018-04-08: 200 mg via ORAL

## 2018-04-08 MED ORDER — CLONIDINE HCL 0.2 MG PO TABS: 0.2000 mg | ORAL_TABLET | Freq: Once | ORAL | Status: DC

## 2018-04-08 MED ORDER — SODIUM CHLORIDE 0.9 % IV SOLN
INTRAVENOUS | Status: DC | PRN
  Administered 2018-04-08: 11:00:00

## 2018-04-08 MED ORDER — ACETAMINOPHEN 500 MG PO TABS
1000.0000 mg | ORAL_TABLET | Freq: Once | ORAL | Status: AC
  Administered 2018-04-08: 1000 mg via ORAL
  Filled 2018-04-08: qty 2

## 2018-04-08 MED ORDER — ONDANSETRON HCL 4 MG PO TABS: 4.0000 mg | ORAL_TABLET | Freq: Three times a day (TID) | ORAL | Status: DC | PRN

## 2018-04-08 MED ORDER — HYDROMORPHONE HCL 1 MG/ML IJ SOLN: 0.2500 mg | INTRAMUSCULAR | Status: DC | PRN

## 2018-04-08 MED ORDER — FENTANYL CITRATE (PF) 250 MCG/5ML IJ SOLN
INTRAMUSCULAR | Status: AC
  Filled 2018-04-08: qty 5

## 2018-04-08 MED ORDER — ZOLPIDEM TARTRATE 5 MG PO TABS
5.0000 mg | ORAL_TABLET | Freq: Every evening | ORAL | Status: DC | PRN
  Administered 2018-04-08: 5 mg via ORAL
  Filled 2018-04-08: qty 1

## 2018-04-08 MED ORDER — ONDANSETRON HCL 4 MG/2ML IJ SOLN: 4.0000 mg | Freq: Once | INTRAMUSCULAR | Status: DC | PRN

## 2018-04-08 MED ORDER — LIDOCAINE 2% (20 MG/ML) 5 ML SYRINGE
INTRAMUSCULAR | Status: DC | PRN
  Administered 2018-04-08: 100 mg via INTRAVENOUS

## 2018-04-08 MED ORDER — PANTOPRAZOLE SODIUM 40 MG PO TBEC
40.0000 mg | DELAYED_RELEASE_TABLET | Freq: Two times a day (BID) | ORAL | Status: DC
  Administered 2018-04-08 – 2018-04-09 (×2): 40 mg via ORAL
  Filled 2018-04-08 (×2): qty 1

## 2018-04-08 MED ORDER — ALUM & MAG HYDROXIDE-SIMETH 200-200-20 MG/5ML PO SUSP: 30.0000 mL | Freq: Four times a day (QID) | ORAL | Status: DC | PRN

## 2018-04-08 MED ORDER — VITAMIN D (ERGOCALCIFEROL) 1.25 MG (50000 UNIT) PO CAPS: 50000.0000 [IU] | ORAL_CAPSULE | ORAL | Status: DC

## 2018-04-08 MED ORDER — CLONAZEPAM 0.5 MG PO TABS
0.5000 mg | ORAL_TABLET | Freq: Every day | ORAL | Status: DC
  Administered 2018-04-08: 0.5 mg via ORAL
  Filled 2018-04-08: qty 1

## 2018-04-08 MED ORDER — ONDANSETRON HCL 4 MG/2ML IJ SOLN
INTRAMUSCULAR | Status: DC | PRN
  Administered 2018-04-08: 4 mg via INTRAVENOUS

## 2018-04-08 MED ORDER — MIDAZOLAM HCL 5 MG/5ML IJ SOLN
INTRAMUSCULAR | Status: DC | PRN
  Administered 2018-04-08: 2 mg via INTRAVENOUS

## 2018-04-08 MED ORDER — PANTOPRAZOLE SODIUM 40 MG IV SOLR: 40.0000 mg | Freq: Every day | INTRAVENOUS | Status: DC

## 2018-04-08 MED ORDER — BSS IO SOLN
15.0000 mL | Freq: Once | INTRAOCULAR | Status: AC
  Administered 2018-04-08: 15 mL

## 2018-04-08 MED ORDER — THROMBIN 20000 UNITS EX SOLR
CUTANEOUS | Status: AC
  Filled 2018-04-08: qty 20000

## 2018-04-08 MED ORDER — SODIUM CHLORIDE 0.9% FLUSH: 3.0000 mL | INTRAVENOUS | Status: DC | PRN

## 2018-04-08 MED ORDER — NORTRIPTYLINE HCL 25 MG PO CAPS
25.0000 mg | ORAL_CAPSULE | Freq: Every day | ORAL | Status: DC
  Administered 2018-04-08: 25 mg via ORAL
  Filled 2018-04-08: qty 1

## 2018-04-08 MED ORDER — KETOROLAC TROMETHAMINE 0.5 % OP SOLN
1.0000 [drp] | Freq: Three times a day (TID) | OPHTHALMIC | Status: DC | PRN
  Administered 2018-04-08: 1 [drp] via OPHTHALMIC

## 2018-04-08 MED ORDER — ACETAMINOPHEN 325 MG PO TABS: 650.0000 mg | ORAL_TABLET | ORAL | Status: DC | PRN

## 2018-04-08 MED ORDER — SODIUM CHLORIDE 0.9 % IV SOLN
INTRAVENOUS | Status: DC | PRN
  Administered 2018-04-08: 25 ug/min via INTRAVENOUS

## 2018-04-08 MED ORDER — SODIUM CHLORIDE 0.9% FLUSH
3.0000 mL | Freq: Two times a day (BID) | INTRAVENOUS | Status: DC
  Administered 2018-04-08: 3 mL via INTRAVENOUS

## 2018-04-08 MED ORDER — ROCURONIUM BROMIDE 50 MG/5ML IV SOSY
PREFILLED_SYRINGE | INTRAVENOUS | Status: DC | PRN
  Administered 2018-04-08: 20 mg via INTRAVENOUS
  Administered 2018-04-08: 10 mg via INTRAVENOUS
  Administered 2018-04-08: 20 mg via INTRAVENOUS
  Administered 2018-04-08: 50 mg via INTRAVENOUS

## 2018-04-08 MED ORDER — IPRATROPIUM BROMIDE 0.06 % NA SOLN: 2.0000 | Freq: Every day | NASAL | Status: DC | PRN

## 2018-04-08 MED ORDER — THROMBIN 20000 UNITS EX SOLR
CUTANEOUS | Status: DC | PRN
  Administered 2018-04-08: 11:00:00 via TOPICAL

## 2018-04-08 MED ORDER — CEFAZOLIN SODIUM-DEXTROSE 2-4 GM/100ML-% IV SOLN
2.0000 g | INTRAVENOUS | Status: AC
  Administered 2018-04-08: 2 g via INTRAVENOUS

## 2018-04-08 MED ORDER — GABAPENTIN 100 MG PO CAPS
ORAL_CAPSULE | ORAL | Status: AC
  Administered 2018-04-08: 200 mg via ORAL
  Filled 2018-04-08: qty 2

## 2018-04-08 MED ORDER — OXYCODONE HCL 5 MG PO TABS
ORAL_TABLET | ORAL | Status: AC
  Filled 2018-04-08: qty 1

## 2018-04-08 MED ORDER — GABAPENTIN 300 MG PO CAPS: 300.0000 mg | ORAL_CAPSULE | Freq: Once | ORAL | Status: DC

## 2018-04-08 MED ORDER — MORPHINE SULFATE (PF) 2 MG/ML IV SOLN: 2.0000 mg | INTRAVENOUS | Status: DC | PRN

## 2018-04-08 MED ORDER — HYDROMORPHONE HCL 1 MG/ML IJ SOLN: 1.0000 mg | INTRAMUSCULAR | Status: DC | PRN

## 2018-04-08 MED ORDER — ROCURONIUM BROMIDE 50 MG/5ML IV SOSY
PREFILLED_SYRINGE | INTRAVENOUS | Status: AC
  Filled 2018-04-08: qty 20

## 2018-04-08 MED ORDER — PHENYLEPHRINE 40 MCG/ML (10ML) SYRINGE FOR IV PUSH (FOR BLOOD PRESSURE SUPPORT)
PREFILLED_SYRINGE | INTRAVENOUS | Status: AC
  Filled 2018-04-08: qty 10

## 2018-04-08 SURGICAL SUPPLY — 78 items
ADH SKN CLS APL DERMABOND .7 (GAUZE/BANDAGES/DRESSINGS) ×1
APL SKNCLS STERI-STRIP NONHPOA (GAUZE/BANDAGES/DRESSINGS) ×1
BAG DECANTER FOR FLEXI CONT (MISCELLANEOUS) ×2 IMPLANT
BASKET BONE COLLECTION (BASKET) ×2 IMPLANT
BENZOIN TINCTURE PRP APPL 2/3 (GAUZE/BANDAGES/DRESSINGS) ×2 IMPLANT
BIT DRILL 13 (BIT) ×1 IMPLANT
BIT DRILL NEURO 2X3.1 SFT TUCH (MISCELLANEOUS) ×1 IMPLANT
BONE VIVIGEN FORMABLE 1.3CC (Bone Implant) ×4 IMPLANT
BUR MATCHSTICK NEURO 3.0 LAGG (BURR) ×1 IMPLANT
CANISTER SUCT 3000ML PPV (MISCELLANEOUS) ×2 IMPLANT
CARTRIDGE OIL MAESTRO DRILL (MISCELLANEOUS) ×1 IMPLANT
COVER WAND RF STERILE (DRAPES) ×2 IMPLANT
DECANTER SPIKE VIAL GLASS SM (MISCELLANEOUS) ×1 IMPLANT
DERMABOND ADVANCED (GAUZE/BANDAGES/DRESSINGS) ×1
DERMABOND ADVANCED .7 DNX12 (GAUZE/BANDAGES/DRESSINGS) IMPLANT
DIFFUSER DRILL AIR PNEUMATIC (MISCELLANEOUS) ×2 IMPLANT
DRAIN SNY WOU 7FLT (WOUND CARE) ×1 IMPLANT
DRAPE C-ARM 42X72 X-RAY (DRAPES) ×4 IMPLANT
DRAPE LAPAROTOMY 100X72 PEDS (DRAPES) ×2 IMPLANT
DRAPE MICROSCOPE LEICA (MISCELLANEOUS) ×3 IMPLANT
DRILL NEURO 2X3.1 SOFT TOUCH (MISCELLANEOUS) ×2
DRSG OPSITE POSTOP 4X6 (GAUZE/BANDAGES/DRESSINGS) ×1 IMPLANT
DURAPREP 6ML APPLICATOR 50/CS (WOUND CARE) ×2 IMPLANT
ELECT COATED BLADE 2.86 ST (ELECTRODE) ×2 IMPLANT
ELECT REM PT RETURN 9FT ADLT (ELECTROSURGICAL) ×2
ELECTRODE REM PT RTRN 9FT ADLT (ELECTROSURGICAL) ×1 IMPLANT
EVACUATOR SILICONE 100CC (DRAIN) ×1 IMPLANT
GAUZE 4X4 16PLY RFD (DISPOSABLE) IMPLANT
GAUZE SPONGE 4X4 12PLY STRL (GAUZE/BANDAGES/DRESSINGS) ×2 IMPLANT
GLOVE BIO SURGEON STRL SZ7 (GLOVE) IMPLANT
GLOVE BIO SURGEON STRL SZ8 (GLOVE) ×2 IMPLANT
GLOVE BIOGEL PI IND STRL 6.5 (GLOVE) IMPLANT
GLOVE BIOGEL PI IND STRL 7.0 (GLOVE) IMPLANT
GLOVE BIOGEL PI IND STRL 7.5 (GLOVE) IMPLANT
GLOVE BIOGEL PI INDICATOR 6.5 (GLOVE) ×1
GLOVE BIOGEL PI INDICATOR 7.0 (GLOVE)
GLOVE BIOGEL PI INDICATOR 7.5 (GLOVE) ×2
GLOVE EXAM NITRILE XL STR (GLOVE) IMPLANT
GLOVE INDICATOR 8.5 STRL (GLOVE) ×2 IMPLANT
GLOVE SURG SS PI 6.0 STRL IVOR (GLOVE) ×2 IMPLANT
GLOVE SURG SS PI 7.0 STRL IVOR (GLOVE) ×2 IMPLANT
GOWN STRL REUS W/ TWL LRG LVL3 (GOWN DISPOSABLE) ×1 IMPLANT
GOWN STRL REUS W/ TWL XL LVL3 (GOWN DISPOSABLE) ×1 IMPLANT
GOWN STRL REUS W/TWL 2XL LVL3 (GOWN DISPOSABLE) ×2 IMPLANT
GOWN STRL REUS W/TWL LRG LVL3 (GOWN DISPOSABLE) ×8
GOWN STRL REUS W/TWL XL LVL3 (GOWN DISPOSABLE) ×2
GRAFT BNE MATRIX VG FRMBL SM 1 (Bone Implant) IMPLANT
HALTER HD/CHIN CERV TRACTION D (MISCELLANEOUS) ×2 IMPLANT
HEMOSTAT POWDER KIT SURGIFOAM (HEMOSTASIS) ×2 IMPLANT
KIT BASIN OR (CUSTOM PROCEDURE TRAY) ×2 IMPLANT
KIT TURNOVER KIT B (KITS) ×2 IMPLANT
NDL HYPO 18GX1.5 BLUNT FILL (NEEDLE) ×1 IMPLANT
NDL SPNL 20GX3.5 QUINCKE YW (NEEDLE) ×1 IMPLANT
NEEDLE HYPO 18GX1.5 BLUNT FILL (NEEDLE) ×2 IMPLANT
NEEDLE SPNL 20GX3.5 QUINCKE YW (NEEDLE) ×2 IMPLANT
NS IRRIG 1000ML POUR BTL (IV SOLUTION) ×2 IMPLANT
OIL CARTRIDGE MAESTRO DRILL (MISCELLANEOUS) ×2
PACK LAMINECTOMY NEURO (CUSTOM PROCEDURE TRAY) ×2 IMPLANT
PAD ARMBOARD 7.5X6 YLW CONV (MISCELLANEOUS) ×6 IMPLANT
PIN DISTRACTION 14MM (PIN) IMPLANT
PLATE 4 82.5XLCK NS SPNE CVD (Plate) IMPLANT
PLATE 4 ATLANTIS TRANS (Plate) ×2 IMPLANT
RUBBERBAND STERILE (MISCELLANEOUS) ×6 IMPLANT
SCREW ST 15X4XST FXANG NS (Screw) IMPLANT
SCREW ST FIX 4 ATL (Screw) ×12 IMPLANT
SCREW ST FIX 4 ATL 3120213 (Screw) ×4 IMPLANT
SPACER ACDF COLONIAL 12X14X9 (Spacer) ×1 IMPLANT
SPACER ACDF TPS LG PARALLEL 7 (Spacer) ×2 IMPLANT
SPACER COLONIAL ACDF TPS 8 LG (Spacer) ×1 IMPLANT
SPONGE INTESTINAL PEANUT (DISPOSABLE) ×3 IMPLANT
SPONGE SURGIFOAM ABS GEL 100 (HEMOSTASIS) ×2 IMPLANT
STRIP CLOSURE SKIN 1/2X4 (GAUZE/BANDAGES/DRESSINGS) ×2 IMPLANT
SUT VIC AB 3-0 SH 8-18 (SUTURE) ×2 IMPLANT
SUT VIC AB 4-0 PS2 27 (SUTURE) ×2 IMPLANT
TAPE CLOTH 4X10 WHT NS (GAUZE/BANDAGES/DRESSINGS) IMPLANT
TOWEL GREEN STERILE (TOWEL DISPOSABLE) ×2 IMPLANT
TOWEL GREEN STERILE FF (TOWEL DISPOSABLE) ×2 IMPLANT
WATER STERILE IRR 1000ML POUR (IV SOLUTION) ×2 IMPLANT

## 2018-04-08 NOTE — H&P (Signed)
[name] is an 48 y.o. male.   Chief Complaint: neck pain bilateral shoulder and arm pain HPI: 48 year old with long-standing neck pain but is very sensing shoulder pain arm pain numbness tingling weakness in his hands is progressive last several weeks and months. Workup has revealed severe cervical stenosis spondylosis signal change within his cord and cord compression. Due to patient's  Clinical exam consistent with a progressive myelopathy imaging findings consistent with severe cord compression at multiple levels I recommended ACDF from C3-C7. I've extensively gone over the risks and benefits of that operation with him as well as perioperative course expectations of outcome and alternatives of surgery and he understood and agreed to proceed forward.  Past Medical History:  Diagnosis Date  . Anxiety    takes Xanax daily prn, panic attacks  . Chronic gastric ulcer with bleeding s/p suture repair 2012 09/29/2010   Head 3 EGD which failed to stop the bleeding. Had exploratory laparotomy on 10/04/2010. Possible cause of the gastric ulcer was NSAID use.   . Constipation    related to pain meds  . Depression   . Dizziness    occasionally  . Dyspnea    walking a bit  . Erosive esophagitis   . External hemorrhoid, bleeding   . Gastric ulcer    history of  . GERD (gastroesophageal reflux disease)    takes Protonix bid  . Hiatal hernia   . History of blood clots 2012   in abdomen  . History of blood transfusion 2012  . History of blood transfusion   . Incisional hernia s/p open repair w mesh Aug 2013 05/26/2011  . Joint pain    knees  . Nocturia    depends on amount of fluid he drinks  . Pneumonia 2018  . Pre-diabetes   . Primary localized osteoarthritis of left hip 04/24/2014  . Serrated polyp of colon   . Sleep apnea    no cpap  mild no cpap needed  . Stroke ([place])   . Upper GI bleed   . Ventral hernia     Past Surgical History:  Procedure Laterality Date  . COLONOSCOPY     . ESOPHAGEAL MANOMETRY N/A 10/19/2016   Procedure: ESOPHAGEAL MANOMETRY (EM);  Surgeon: Mauri Pole, MD;  Location: [facility];  Service: Endoscopy;  Laterality: N/A;  . ESOPHAGOGASTRODUODENOSCOPY    . ESOPHAGOGASTRODUODENOSCOPY N/A 01/22/2017   Procedure: ESOPHAGOGASTRODUODENOSCOPY (EGD);  Surgeon: [name], MD;  Location: [facility];  Service: General;  Laterality: N/A;  . INCISIONAL HERNIA REPAIR  09/04/2011   Procedure: HERNIA REPAIR INCISIONAL;  Surgeon: Madilyn Hook, DO;  Location: [facility];  Service: General;  Laterality: N/A;  debridment calcified mass  . INSERTION OF MESH  09/04/2011   retrorectus ultrapro "30x30cm"  . INSERTION OF MESH N/A 01/22/2017   Procedure: INSERTION OF MESH;  Surgeon: [name], MD;  Location: [facility];  Service: General;  Laterality: N/A;  . NISSEN FUNDOPLICATION     Open 4235T  . POLYPECTOMY    . STOMACH SURGERY  10/05/2010   Oversewing of gastric ulcer.  Dr Brantley Stage  . TOTAL HIP ARTHROPLASTY Left 04/24/2014   Procedure: LEFT TOTAL HIP ARTHROPLASTY;  Surgeon: Marchia Bond, MD;  Location: [facility];  Service: Orthopedics;  Laterality: Left;  . UPPER GASTROINTESTINAL ENDOSCOPY      Family History  Problem Relation Age of Onset  . Diabetes Mother   . Liver disease Mother   . Diabetes Father   . Heart disease Father   .  Colon cancer Neg Hx   . Stomach cancer Neg Hx   . Rectal cancer Neg Hx   . Esophageal cancer Neg Hx   . Colon polyps Neg Hx    Social History:  reports that he has never smoked. His smokeless tobacco use includes chew. He reports current alcohol use. He reports that he does not use drugs.  Allergies:  Allergies  Allergen Reactions  . Dilaudid [Hydromorphone Hcl] Rash and Other (See Comments)    Shakey  . Metoclopramide Hcl Hives, Swelling, Rash and Other (See Comments)    Swelling in lips  . Nsaids     H/o bleeding gastric ulcers  . Benadryl [Diphenhydramine]     Causes patient to become hyper and pace    Medications  Prior to Admission  Medication Sig Dispense Refill  . acetaminophen (TYLENOL) 500 MG tablet Take 1,000 mg by mouth every 6 (six) hours as needed for mild pain or headache.    . clonazePAM (KLONOPIN) 0.5 MG tablet TAKE 1 TABLET (0.5 MG TOTAL) BY MOUTH AT BEDTIME. 30 tablet 2  . fluticasone (FLONASE) 50 MCG/ACT nasal spray Place 2 sprays into both nostrils daily as needed for allergies.    Marland Kitchen gabapentin (NEURONTIN) 100 MG capsule Take 1 capsule (100 mg total) by mouth 3 (three) times daily. 90 capsule 5  . ipratropium (ATROVENT) 0.06 % nasal spray Place 2 sprays into both nostrils 4 (four) times daily. (Patient taking differently: Place 2 sprays into both nostrils daily as needed for rhinitis. ) 15 mL 12  . nortriptyline (PAMELOR) 25 MG capsule Take 1 capsule (25 mg total) by mouth at bedtime. 30 capsule 3  . ondansetron (ZOFRAN) 4 MG tablet Take 1 tablet (4 mg total) by mouth every 8 (eight) hours as needed for nausea or vomiting. 20 tablet 0  . pantoprazole (PROTONIX) 40 MG tablet Take 1 tablet (40 mg total) by mouth 2 (two) times daily. 180 tablet 2  . Vitamin D, Ergocalciferol, (DRISDOL) 1.25 MG (50000 UT) CAPS capsule Take 1 capsule (50,000 Units total) by mouth every 7 (seven) days. On Monday 12 capsule 0  . zolpidem (AMBIEN CR) 12.5 MG CR tablet TAKE 1 TABLET (12.5 MG TOTAL) BY MOUTH AT BEDTIME AS NEEDED FOR SLEEP. 30 tablet 2  . aspirin EC 81 MG EC tablet Take 1 tablet (81 mg total) by mouth daily.      No results found for this or any previous visit (from the past 48 hour(s)). No results found.  Review of Systems  Musculoskeletal: Positive for neck pain.  Neurological: Positive for tingling and sensory change.    Blood pressure (!) 148/89, pulse 88, temperature 98.6 F (37 C), temperature source Oral, resp. rate 18, height 5\' 9"  (1.753 m), weight 94 kg, SpO2 97 %. Physical Exam  Constitutional: He is oriented to person, place, and time. He appears well-developed.  HENT:  Head:  Normocephalic.  Eyes: Pupils are equal, round, and reactive to light.  Neck: Normal range of motion.  Cardiovascular: Normal rate.  Respiratory: Effort normal.  GI: Soft. Bowel sounds are normal.  Musculoskeletal: Normal range of motion.  Neurological: He is alert and oriented to person, place, and time. He has normal strength. GCS eye subscore is 4. GCS verbal subscore is 5. GCS motor subscore is 6.  Strength has slight weakness in his grip bilaterally at 4+ out of 5 otherwise she is 5 out of 5     Assessment/Plan 48 year old presents for ACDF C3 C7  Malika Demario  P, MD 04/08/2018, 9:50 AM

## 2018-04-08 NOTE — Op Note (Signed)
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 Deatra Ina  Anesthesia: Gen.  EBL: Less than 200  History of present illness: 48 year old gentleman presents with progressive worsening neck pain arm pain numbness tingling weakness in his hands difficult walking workup revealed severe cord compression with signal changes cord from large herniated disks at C3-4, C4-5, C5-6, C6-7. Due to patient's progression of clinical syndrome imaging findings and failure conservative treatment I recommended anterior cervical discectomies and fusion at those levels I extensively went over the risks and benefits of the operation with him as well as perioperative course expectations of outcome and alternatives to surgery and he understands and agrees to proceed forward.  Operative procedure: Patient brought into the or was just general anesthesia positioned supine the neck in slight extension in 5 pounds of halter traction the right-side of his neck was prepped and Draped in routine sterile fashion preoperative x-ray localize the appropriate level so a curvilinear incision was made  Just off midline to the anterior border of the sternomastoid. Then the platysma was dissected free and divided longitudinally the avascular plane between the sternomastoid and strap muscles down to the prevertebral fascia which was dissected away with Kitners. Interoperative x-rayconfirmed with desiccation appropriate level so longus close reflected laterally and self-retaining retractors placed first put in our attention at C3-4 and  C4-5. Anterior aspect of did not Leksell rongeur and both the space were incised using accommodation of the upgoing curettes and a high-speed drill capturing the bone shavings in a mucous trap both disc spaces were drilled down to the posterior annulus aspect complex. Under Mike's cup illumination very large disc herniation was removed that had migrated subligamentous C3-4 and C4-5 and aggressive undergoing of both endplates of both levels decompressed central canal both C4 nerve roots and C5 nerve roots respectively were skeletonized flush with pedicle both levels and after adequate decompression achieved both those disc space levels an adequate endplate preparation been achieved 2 globus peek cages were packed with locally harvested autograft mixed and inserted 1-2 mm deep to the anterior vertebral body line. The retractor was then repositioned for C5-6 and C6-7 and a similar fashion both the space were drilled down with a high-speed drill AND bone shavings in a mucous trap and then 2 large disc herniations at this level causing severe cord compression as well as a large foraminal disc at C6 on the left this is removed both C6 nerve roots both C7 nerve roots widely decompressed and the central canal was decompressed a 2 cages were also selected and inserted at both the space levels then 82-1/2 mm Atlantis translational plate was selected all screws had excellent purchase locking mechanisms engaged postop fluoroscopy confirmed good position of the plate and  Implants.J-P drain was placed the wound scope was irrigated meticulous he states was maintained and the wounds closed in layers with after Vicryl and platysma running 4 subcuticular and skin Dermabond benzo and Steri-Strips and sterile dressing. Patient underwent recovered in stable condition. At the end of case on it counts sponge counts were correct.

## 2018-04-08 NOTE — Plan of Care (Signed)
  Problem: Safety: Goal: Ability to remain free from injury will improve Outcome: Progressing   Problem: Education: Goal: Ability to verbalize activity precautions or restrictions will improve Outcome: Progressing Goal: Knowledge of the prescribed therapeutic regimen will improve Outcome: Progressing Goal: Understanding of discharge needs will improve Outcome: Progressing   Problem: Activity: Goal: Ability to avoid complications of mobility impairment will improve Outcome: Progressing Goal: Ability to tolerate increased activity will improve Outcome: Progressing Goal: Will remain free from falls Outcome: Progressing   Problem: Pain Management: Goal: Pain level will decrease Outcome: Progressing   Problem: Bladder/Genitourinary: Goal: Urinary functional status for postoperative course will improve Outcome: Progressing

## 2018-04-08 NOTE — Anesthesia Procedure Notes (Addendum)
Procedure Name: Intubation Date/Time: 04/08/2018 10:24 AM Performed by: Inda Coke, CRNA Pre-anesthesia Checklist: Patient identified, Emergency Drugs available, Suction available and Patient being monitored Patient Re-evaluated:Patient Re-evaluated prior to induction Oxygen Delivery Method: Circle System Utilized Preoxygenation: Pre-oxygenation with 100% oxygen Induction Type: IV induction Ventilation: Mask ventilation without difficulty and Oral airway inserted - appropriate to patient size Laryngoscope Size: Glidescope and 4 Grade View: Grade I Tube type: Oral Tube size: 7.5 mm Number of attempts: 1 Airway Equipment and Method: Stylet and Oral airway Placement Confirmation: ETT inserted through vocal cords under direct vision,  positive ETCO2 and breath sounds checked- equal and bilateral Secured at: 22 cm Tube secured with: Tape Dental Injury: Teeth and Oropharynx as per pre-operative assessment  Comments: Elective glidescope intubation

## 2018-04-08 NOTE — Anesthesia Postprocedure Evaluation (Signed)
Anesthesia Post Note  Patient: [name]  Procedure(s) Performed: ACDF - C3-C4 - C4-C5 - C5-C6 - C6-C7 (N/A )     Patient location during evaluation: PACU Anesthesia Type: General Level of consciousness: awake and alert Pain management: pain level controlled Vital Signs Assessment: post-procedure vital signs reviewed and stable Respiratory status: spontaneous breathing, nonlabored ventilation, respiratory function stable and patient connected to nasal cannula oxygen Cardiovascular status: blood pressure returned to baseline and stable Postop Assessment: no apparent nausea or vomiting Anesthetic complications: no    Last Vitals:  Vitals:   04/08/18 1335 04/08/18 1350  BP: 134/83 127/78  Pulse: 95 94  Resp: 16 11  Temp: (!) 36.2 C   SpO2: 98% 96%    Last Pain:  Vitals:   04/08/18 1400  TempSrc:   PainSc: 10-Worst pain ever                 Barnet Glasgow

## 2018-04-08 NOTE — Transfer of Care (Signed)
Immediate Anesthesia Transfer of Care Note  Patient: [name]  Procedure(s) Performed: ACDF - C3-C4 - C4-C5 - C5-C6 - C6-C7 (N/A )  Patient Location: PACU  Anesthesia Type:General  Level of Consciousness: awake and alert   Airway & Oxygen Therapy: Patient Spontanous Breathing and Patient connected to nasal cannula oxygen  Post-op Assessment: Report given to RN, Post -op Vital signs reviewed and stable and Patient moving all extremities X 4  Post vital signs: Reviewed and stable  Last Vitals:  Vitals Value Taken Time  BP 134/83 04/08/2018  1:35 PM  Temp    Pulse 98 04/08/2018  1:38 PM  Resp 16 04/08/2018  1:38 PM  SpO2 96 % 04/08/2018  1:38 PM  Vitals shown include unvalidated device data.  Last Pain:  Vitals:   04/08/18 0857  TempSrc:   PainSc: 8       Patients Stated Pain Goal: 5 ([id number])  Complications: No apparent anesthesia complications

## 2018-04-09 MED ORDER — CYCLOBENZAPRINE HCL 10 MG PO TABS: 10.0000 mg | ORAL_TABLET | Freq: Three times a day (TID) | ORAL | 0 refills | Status: DC | PRN

## 2018-04-09 MED ORDER — HYDROCODONE-ACETAMINOPHEN 10-325 MG PO TABS: 1.0000 | ORAL_TABLET | Freq: Four times a day (QID) | ORAL | 0 refills | Status: DC | PRN

## 2018-04-09 MED FILL — HYDROCODON-APAP 10-325: 10-325 | 6 days supply | Qty: 40 | Fill #0

## 2018-04-09 MED FILL — CYCLOBENZAPRINE 10 MG TAB: 10 | 10 days supply | Qty: 30 | Fill #0

## 2018-04-09 NOTE — Discharge Instructions (Signed)

## 2018-04-09 NOTE — Evaluation (Signed)
Physical Therapy Evaluation and Discharge Patient Details Name: [name] MRN: [id number] DOB: 04-17-1970 Today's Date: 04/09/2018   History of Present Illness  Pt is a 48 y/o male who presents s/p C3-C7 ACDF on 04/08/2018. PMH significant for hernia s/p repair, upper GIB, CVA, L THR 2016, ankle fx December 2019.  Clinical Impression  Patient evaluated by Physical Therapy with no further acute PT needs identified. All education has been completed and the patient has no further questions. At the time of PT eval pt was able to perform transfers and ambulation with gross modified independence and RW due to prior ankle fx. Pt is able to ambulate around the room without assistance and without AD. Pt was educated on precautions, brace application/wearing schedule, activity progression, car transfer, and positioning recommendations. See below for any follow-up Physical Therapy or equipment needs. PT is signing off. Thank you for this referral.     Follow Up Recommendations No PT follow up;Supervision - Intermittent    Equipment Recommendations  None recommended by PT    Recommendations for Other Services       Precautions / Restrictions Precautions Precautions: Cervical Precaution Booklet Issued: Yes (comment) Precaution Comments: eviewed handout with pt. He was cued for precautions during functional mobility.  Required Braces or Orthoses: Cervical Brace Cervical Brace: Soft collar Restrictions Weight Bearing Restrictions: No      Mobility  Bed Mobility Overal bed mobility: Modified Independent             General bed mobility comments: VC's for log roll technique. No assist to transition to EOB.   Transfers Overall transfer level: Modified independent Equipment used: None             General transfer comment: Pt was able to power-up to full stand with min use of UE's. Pt Used RW once initiating ambulation due to ankle  Ambulation/Gait Ambulation/Gait assistance:  Modified independent (Device/Increase time) Gait Distance (Feet): 400 Feet Assistive device: Rolling walker (2 wheeled) Gait Pattern/deviations: Step-through pattern;Decreased stride length Gait velocity: Decreased Gait velocity interpretation: 1.31 - 2.62 ft/sec, indicative of limited community ambulator General Gait Details: Pt using RW for ankle - balance mod I without AD around room.   Stairs            Wheelchair Mobility    Modified Rankin (Stroke Patients Only)       Balance Overall balance assessment: No apparent balance deficits (not formally assessed)                                           Pertinent Vitals/Pain Pain Assessment: Faces Faces Pain Scale: Hurts a little bit Pain Location: Incision site Pain Descriptors / Indicators: Operative site guarding;Discomfort    Home Living Family/patient expects to be discharged to:: Private residence Living Arrangements: Other relatives(Brother and nephew) Available Help at Discharge: Family Type of Home: House Home Access: Stairs to enter Entrance Stairs-Rails: None Entrance Stairs-Number of Steps: 2 Home Layout: One level Home Equipment: Cane - single point;Shower seat      Prior Function Level of Independence: Independent         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  OT to further assess.  LUE Sensation: decreased light touch    Lower Extremity Assessment Lower Extremity Assessment: RLE deficits/detail RLE Deficits / Details: Prior ankle fx in December - wearing a boot during the day    Cervical / Trunk Assessment Cervical / Trunk Assessment: Other exceptions Cervical / Trunk Exceptions: s/p surgery  Communication   Communication: No  difficulties  Cognition Arousal/Alertness: Awake/alert Behavior During Therapy: WFL for tasks assessed/performed Overall Cognitive Status: Within Functional Limits for tasks assessed                                        General Comments      Exercises     Assessment/Plan    PT Assessment Patent does not need any further PT services  PT Problem List         PT Treatment Interventions      PT Goals (Current goals can be found in the Care Plan section)  Acute Rehab PT Goals Patient Stated Goal: Home today; return to work PT Goal Formulation: All assessment and education complete, DC therapy    Frequency     Barriers to discharge        Co-evaluation               AM-PAC PT "6 Clicks" Mobility  Outcome Measure Help needed turning from your back to your side while in a flat bed without using bedrails?: None Help needed moving from lying on your back to sitting on the side of a flat bed without using bedrails?: None Help needed moving to and from a bed to a chair (including a wheelchair)?: None Help needed standing up from a chair using your arms (e.g., wheelchair or bedside chair)?: None Help needed to walk in hospital room?: None Help needed climbing 3-5 steps with a railing? : A Little 6 Click Score: 23    End of Session Equipment Utilized During Treatment: Cervical collar Activity Tolerance: Patient tolerated treatment well Patient left: in chair;with call bell/phone within reach;with family/visitor present Nurse Communication: Mobility status PT Visit Diagnosis: Pain;Other symptoms and signs involving the nervous system (R29.898) Pain - part of body: (Neck)    Time: 4585-9292 PT Time Calculation (min) (ACUTE ONLY): 17 min   Charges:   PT Evaluation $PT Eval Low Complexity: 1 Low          Rolinda Roan, PT, DPT Acute Rehabilitation Services Pager: [phone] Office: [phone]   Thelma Comp 04/09/2018, 9:28 AM

## 2018-04-09 NOTE — Progress Notes (Signed)
Patient is discharged from room 3C06 at this time. Alert and in stable condition. IV site d/c'd and instructions read to patient and brother with understanding verbalized. Left unit via wheelchair with all belongings at side.

## 2018-04-09 NOTE — Discharge Summary (Signed)
Physician Discharge Summary  Patient ID: [name] MRN: [id number] DOB/AGE: 1971-01-16 48 y.o.  Admit date: 04/08/2018 Discharge date: 04/09/2018  Admission Diagnoses:  Discharge Diagnoses:  Active Problems:   Myelopathy [facility])   Discharged Condition: good  Hospital Course: Patient admitted to the hospital where he underwent uncomplicated 4 level anterior cervical decompression and fusion.  Postop Truman Hayward doing very well.  Preoperative neck and upper extremity symptoms much improved.  Standing and ambulating without difficulty.  Ready for discharge home.  Consults:  Significant Diagnostic Studies:   Treatments:   Discharge Exam: Blood pressure 119/86, pulse 93, temperature 98.4 F (36.9 C), temperature source Oral, resp. rate 19, height 5\' 9"  (1.753 m), weight 94 kg, SpO2 96 %. Awake and alert.  Oriented and appropriate motor and sensory function stable.  Wound clean and dry.  Neck soft.  Chest and abdomen benign.  Disposition: Discharge disposition: 01-Home or Self Care        Allergies as of 04/09/2018      Reactions   Dilaudid [hydromorphone Hcl] Rash, Other (See Comments)   Shakey   Metoclopramide Hcl Hives, Swelling, Rash, Other (See Comments)   Swelling in lips   Nsaids    H/o bleeding gastric ulcers   Benadryl [diphenhydramine]    Causes patient to become hyper and pace      Medication List    TAKE these medications   acetaminophen 500 MG tablet Commonly known as:  TYLENOL Take 1,000 mg by mouth every 6 (six) hours as needed for mild pain or headache.   aspirin 81 MG EC tablet Take 1 tablet (81 mg total) by mouth daily.   clonazePAM 0.5 MG tablet Commonly known as:  KLONOPIN TAKE 1 TABLET (0.5 MG TOTAL) BY MOUTH AT BEDTIME.   cyclobenzaprine 10 MG tablet Commonly known as:  FLEXERIL Take 1 tablet (10 mg total) by mouth 3 (three) times daily as needed for muscle spasms.   fluticasone 50 MCG/ACT nasal spray Commonly known as:  FLONASE Place 2 sprays  into both nostrils daily as needed for allergies.   gabapentin 100 MG capsule Commonly known as:  Neurontin Take 1 capsule (100 mg total) by mouth 3 (three) times daily.   HYDROcodone-acetaminophen 10-325 MG tablet Commonly known as:  Norco Take 1 tablet by mouth every 6 (six) hours as needed.   ipratropium 0.06 % nasal spray Commonly known as:  ATROVENT Place 2 sprays into both nostrils 4 (four) times daily. What changed:    when to take this  reasons to take this   nortriptyline 25 MG capsule Commonly known as:  PAMELOR Take 1 capsule (25 mg total) by mouth at bedtime.   ondansetron 4 MG tablet Commonly known as:  ZOFRAN Take 1 tablet (4 mg total) by mouth every 8 (eight) hours as needed for nausea or vomiting.   pantoprazole 40 MG tablet Commonly known as:  PROTONIX Take 1 tablet (40 mg total) by mouth 2 (two) times daily.   Vitamin D (Ergocalciferol) 1.25 MG (50000 UT) Caps capsule Commonly known as:  DRISDOL Take 1 capsule (50,000 Units total) by mouth every 7 (seven) days. On Monday   zolpidem 12.5 MG CR tablet Commonly known as:  AMBIEN CR TAKE 1 TABLET (12.5 MG TOTAL) BY MOUTH AT BEDTIME AS NEEDED FOR SLEEP.        Signed: Cooper Render Katrianna Friesenhahn 04/09/2018, 8:59 AM

## 2018-04-09 NOTE — Evaluation (Signed)
Occupational Therapy Evaluation and Discharge Patient Details Name: [name] MRN: [id number] DOB: 04/19/1970 Today's Date: 04/09/2018    History of Present Illness Pt is a 48 y/o male who presents s/p C3-C7 ACDF on 04/08/2018. PMH significant for hernia s/p repair, upper GIB, CVA, L THR 2016, ankle fx December 2019.   Clinical Impression   Pt is functioning modified independently in ADL with AE issued. Pt verbalizing understanding of cervical precautions to maintained during ADL and IADL. Will have family available to assist as needed. Pt is eager to return to work.    Follow Up Recommendations  No OT follow up    Equipment Recommendations  Hospital bed    Recommendations for Other Services       Precautions / Restrictions Precautions Precautions: Cervical Precaution Booklet Issued: Yes (comment) Precaution Comments: reviewed cervical precautions related to ADL and IADL Required Braces or Orthoses: Cervical Brace Cervical Brace: Soft collar Restrictions Weight Bearing Restrictions: No      Mobility Bed Mobility Pt received in chair.  Transfers Overall transfer level: Modified independent Equipment used: None             General transfer comment: no assist, increased time    Balance Overall balance assessment: No apparent balance deficits (not formally assessed)                                         ADL either performed or assessed with clinical judgement   ADL Overall ADL's : Modified independent                                       General ADL Comments: Issued AE for LB ADL. Pt familiar from prior hip sx. Educated pt in two cup method for toothbrushing and to maintain head in neutral to wash hair. Instructed in body mechanics and lifting precautions during IADL.     Vision Patient Visual Report: No change from baseline       Perception     Praxis      Pertinent Vitals/Pain Pain Assessment: Faces Faces Pain  Scale: Hurts even more Pain Location: Incision site Pain Descriptors / Indicators: Operative site guarding;Discomfort Pain Intervention(s): Patient requesting pain meds-RN notified     Hand Dominance Left   Extremity/Trunk Assessment Upper Extremity Assessment Upper Extremity Assessment: (numbness B hands) LUE Deficits / Details: Pt reports slight improvement in LUE numbness. OT to further assess.  LUE Sensation: decreased light touch   Lower Extremity Assessment Lower Extremity Assessment: RLE deficits/detail RLE Deficits / Details: Prior ankle fx in December - wearing a boot during the day   Cervical / Trunk Assessment Cervical / Trunk Assessment: Other exceptions Cervical / Trunk Exceptions: s/p surgery   Communication Communication Communication: No difficulties   Cognition Arousal/Alertness: Awake/alert Behavior During Therapy: WFL for tasks assessed/performed Overall Cognitive Status: Within Functional Limits for tasks assessed                                     General Comments       Exercises     Shoulder Instructions      Home Living Family/patient expects to be discharged to:: Private residence Living Arrangements: Other relatives(brother and nephew) Available Help  at Discharge: Family Type of Home: House Home Access: Stairs to enter [employer] of Steps: 2 Entrance Stairs-Rails: None Home Layout: One level     Bathroom Shower/Tub: Teacher, early years/pre: [place] - single point;Shower seat          Prior Functioning/Environment Level of Independence: Independent        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[employer]; drives        OT Problem List:        OT Treatment/Interventions:      OT Goals(Current goals can be found in the care plan section) Acute Rehab OT Goals Patient Stated Goal: Home today; return to work  OT Frequency:     Barriers to D/C:             Co-evaluation              AM-PAC OT "6 Clicks" Daily Activity     Outcome Measure Help from another person eating meals?: None Help from another person taking care of personal grooming?: None Help from another person toileting, which includes using toliet, bedpan, or urinal?: None Help from another person bathing (including washing, rinsing, drying)?: None Help from another person to put on and taking off regular upper body clothing?: None Help from another person to put on and taking off regular lower body clothing?: None 6 Click Score: 24   End of Session Equipment Utilized During Treatment: Gait belt  Activity Tolerance: Patient tolerated treatment well Patient left: in chair;with call bell/phone within reach;with nursing/sitter in room;with family/visitor present  OT Visit Diagnosis: Pain                Time: 0817-0900 OT Time Calculation (min): 43 min Charges:  OT General Charges $OT Visit: 1 Visit OT Evaluation $OT Eval Low Complexity: 1 Low OT Treatments $Self Care/Home Management : 23-37 mins  Nestor Lewandowsky, OTR/L Acute Rehabilitation Services Pager: [phone] Office: [phone]  Malka So 04/09/2018, 9:35 AM

## 2018-04-11 ENCOUNTER — Encounter (HOSPITAL_COMMUNITY): Payer: Self-pay | Admitting: Neurosurgery

## 2018-04-11 ENCOUNTER — Other Ambulatory Visit: Payer: Self-pay | Admitting: *Deleted

## 2018-04-11 NOTE — Patient Outreach (Signed)
[facility]) Care Management  04/11/2018  [name] 1970-06-03 [id number]  Transition of care call   Referral received: 04/07/18 Initial outreach: 04/11/18 Procedure/surgery date: 04/08/18 Insurance: [place] [employer] [place]   Subjective: Initial successful telephone call to patient's mobile number in order to complete transition of care assessment; 2 HIPAA identifiers verified. Explained purpose of call and completed transition of care assessment.  States he is doing well, denies post op problems, states surgical pain well managed with prescribed medications, tolerating diet, denies difficulty swallowing, denies bowel or bladder problems, brother, sister and nephew are assisting  with his recovery.    Objective:  [name] was hospitalized at [facility] from 3/6-04/09/2018 for anterior cervical discectomies and fusion at C3-4, C4-5, C5-6, C6-7 utilizing the globus TPS coated peek cages packed with locally harvested autograft mixed with vivigen and anterior cervical plating utilizing the Atlantis translational plating system.  Comorbidities include: GERD, gastric ulcer, mild OSA, prediabetes with Hgb A1C= 5.9% on 10/06/17, degenerative joint disease, depression, hiatal hernia with Nissen repair, Vit D deficiency, stroke He was discharged to home on 04/09/18 without the need for home health services or additional DME.   Assessment:  Patient voices good understanding of all discharge instructions.  See transition of care flowsheet for assessment details.   Plan:  No ongoing care management needs identified so will close case to [facility] care management services and route successful outreach letter with [facility] pamphlet and 24 Hour Nurse Line Magnet to [name] clinical pool to be mailed to patient's home address.   Barrington Ellison RN,CCM,CDE [place]  Management Coordinator Office Phone [phone] Office Fax [phone]

## 2018-04-22 ENCOUNTER — Other Ambulatory Visit: Payer: Self-pay | Admitting: Family Medicine

## 2018-04-22 MED ORDER — VITAMIN D (ERGOCALCIFEROL) 1.25 MG (50000 UNIT) PO CAPS: 50000.0000 [IU] | ORAL_CAPSULE | ORAL | 0 refills | Status: DC

## 2018-04-22 MED FILL — VIT D2 1.25 MG (50,000 UNIT: 1.25 MG | 84 days supply | Qty: 12 | Fill #0

## 2018-05-05 ENCOUNTER — Other Ambulatory Visit: Payer: Self-pay

## 2018-05-05 ENCOUNTER — Encounter: Payer: Self-pay | Admitting: Family Medicine

## 2018-05-05 MED ORDER — CLONAZEPAM 0.5 MG PO TABS: 0.5000 mg | ORAL_TABLET | Freq: Every day | ORAL | 5 refills | Status: DC

## 2018-05-05 MED FILL — clonazePAM 0.5 MG TABS: 0.5 | 30 days supply | Qty: 30 | Fill #0

## 2018-05-05 MED FILL — GABAPENTIN 100 MG CAPSULE: 100 | 30 days supply | Qty: 90 | Fill #0

## 2018-05-05 MED FILL — ZOLPIDEM TART ER 12.5 MG TA: 12.5 | 30 days supply | Qty: 30 | Fill #0

## 2018-05-05 NOTE — Telephone Encounter (Signed)
TA-LOV: 1.16.20/last filled 3.4.20/CSC & UDS are UTD/Per [place] PMP he is compliant without red flags/Rx is prepared in pending for approval/thx dmf

## 2018-05-12 ENCOUNTER — Encounter: Payer: Self-pay | Admitting: Family Medicine

## 2018-05-12 ENCOUNTER — Other Ambulatory Visit: Payer: Self-pay

## 2018-05-12 DIAGNOSIS — K219 Gastro-esophageal reflux disease without esophagitis: Secondary | ICD-10-CM

## 2018-05-12 MED ORDER — PANTOPRAZOLE SODIUM 40 MG PO TBEC: 40.0000 mg | DELAYED_RELEASE_TABLET | Freq: Two times a day (BID) | ORAL | 2 refills | Status: DC

## 2018-06-01 ENCOUNTER — Other Ambulatory Visit: Payer: Self-pay | Admitting: Family Medicine

## 2018-06-01 MED FILL — ZOLPIDEM TART ER 12.5 MG TA: 12.5 | 30 days supply | Qty: 30 | Fill #0

## 2018-06-01 MED FILL — clonazePAM 0.5 MG TABS: 0.5 | 30 days supply | Qty: 30 | Fill #1 | Status: TO

## 2018-06-01 MED FILL — GABAPENTIN 100 MG CAPSULE: 100 | 30 days supply | Qty: 90 | Fill #1 | Status: TO

## 2018-06-13 ENCOUNTER — Other Ambulatory Visit: Payer: Self-pay | Admitting: Family

## 2018-06-14 ENCOUNTER — Other Ambulatory Visit: Payer: Self-pay

## 2018-06-14 ENCOUNTER — Encounter: Payer: Self-pay | Admitting: Family Medicine

## 2018-06-14 MED ORDER — FLUTICASONE PROPIONATE 50 MCG/ACT NA SUSP: 2.0000 | Freq: Every day | NASAL | 11 refills | Status: AC | PRN

## 2018-06-14 MED FILL — FLUTICASONE PROP 50 MCG SPR: 50 | 30 days supply | Qty: 16 | Fill #0

## 2018-06-22 ENCOUNTER — Telehealth: Payer: No Typology Code available for payment source | Admitting: Family

## 2018-06-22 DIAGNOSIS — R05 Cough: Secondary | ICD-10-CM | POA: Diagnosis not present

## 2018-06-22 DIAGNOSIS — R6889 Other general symptoms and signs: Secondary | ICD-10-CM

## 2018-06-22 DIAGNOSIS — Z20822 Contact with and (suspected) exposure to covid-19: Secondary | ICD-10-CM

## 2018-06-22 DIAGNOSIS — R059 Cough, unspecified: Secondary | ICD-10-CM

## 2018-06-22 MED ORDER — BENZONATATE 100 MG PO CAPS: 100.0000 mg | ORAL_CAPSULE | Freq: Three times a day (TID) | ORAL | 0 refills | Status: DC | PRN

## 2018-06-22 MED ORDER — PROMETHAZINE-DM 6.25-15 MG/5ML PO SYRP: 5.0000 mL | ORAL_SOLUTION | Freq: Three times a day (TID) | ORAL | 0 refills | Status: DC | PRN

## 2018-06-22 NOTE — Progress Notes (Signed)
E-Visit for Corona Virus Screening  Based on your current symptoms, you may very well have the virus, however your symptoms are mild. Currently, not all patients are being tested. If the symptoms are mild and there is not a known exposure, performing the test is not indicated.  Approximately 5 minutes was spent documenting and reviewing patient's chart.   You have been enrolled in [facility] for COVID-19. Daily you will receive a questionnaire within the [facility] website. Our COVID-19 response team will be monitoring your responses daily.   Coronavirus disease 2019 (COVID-19) is a respiratory illness that can spread from person to person. The virus that causes COVID-19 is a new virus that was first identified in the country of [place] but is now found in multiple other countries and has spread to the [place].  Symptoms associated with the virus are mild to severe fever, cough, and shortness of breath. There is currently no vaccine to protect against COVID-19, and there is no specific antiviral treatment for the virus.   To be considered HIGH RISK for Coronavirus (COVID-19), you have to meet the following criteria:  . Traveled to [place], [place], [place], [place] or [place]; or in the [place] to [place], [place], [place], or [place]; and have fever, cough, and shortness of breath within the last 2 weeks of travel OR  . Been in close contact with a person diagnosed with COVID-19 within the last 2 weeks and have fever, cough, and shortness of breath  . IF YOU DO NOT MEET THESE CRITERIA, YOU ARE CONSIDERED LOW RISK FOR COVID-19.   It is vitally important that if you feel that you have an infection such as this virus or any other virus that you stay home and away from places where you may spread it to others.  You should self-quarantine for 14 days if you have symptoms that could potentially be coronavirus and avoid contact with people age 82 and older.   You can use  medication such as A prescription cough medication called Tessalon Perles 100 mg. You may take 1-2 capsules every 8 hours as needed for cough and A prescription cough medication called Phenergan DM 6.25 mg/15 mg. You make take one teaspoon / 5 ml every 4-6 hours as needed for cough  You may also take acetaminophen (Tylenol) as needed for fever.   Reduce your risk of any infection by using the same precautions used for avoiding the common cold or flu:  Marland Kitchen Wash your hands often with soap and warm water for at least 20 seconds.  If soap and water are not readily available, use an alcohol-based hand sanitizer with at least 60% alcohol.  . If coughing or sneezing, cover your mouth and nose by coughing or sneezing into the elbow areas of your shirt or coat, into a tissue or into your sleeve (not your hands). . Avoid shaking hands with others and consider head nods or verbal greetings only. . Avoid touching your eyes, nose, or mouth with unwashed hands.  . Avoid close contact with people who are sick. . Avoid places or events with large numbers of people in one location, like concerts or sporting events. . Carefully consider travel plans you have or are making. . If you are planning any travel outside or inside the [place], visit the CDC's Travelers' Health webpage for the latest health notices. . If you have some symptoms but not all symptoms, continue to monitor at home and seek medical attention  if your symptoms worsen. . If you are having a medical emergency, call 911.  HOME CARE . Only take medications as instructed by your medical team. . Drink plenty of fluids and get plenty of rest. . A steam or ultrasonic humidifier can help if you have congestion.   GET HELP RIGHT AWAY IF: . You develop worsening fever. . You become short of breath . You cough up blood. . Your symptoms become more severe MAKE SURE YOU   Understand these instructions.  Will watch your condition.  Will get help right away  if you are not doing well or get worse.  Your e-visit answers were reviewed by a board certified advanced clinical practitioner to complete your personal care plan.  Depending on the condition, your plan could have included both over the counter or prescription medications.  If there is a problem please reply once you have received a response from your provider. Your safety is important to [place].  If you have drug allergies check your prescription carefully.    You can use MyChart to ask questions about today's visit, request a non-urgent call back, or ask for a work or school excuse for 24 hours related to this e-Visit. If it has been greater than 24 hours you will need to follow up with your provider, or enter a new e-Visit to address those concerns. You will get an e-mail in the next two days asking about your experience.  I hope that your e-visit has been valuable and will speed your recovery. Thank you for using e-visits.

## 2018-07-01 ENCOUNTER — Encounter: Payer: Self-pay | Admitting: Family Medicine

## 2018-07-01 MED ORDER — ZOLPIDEM TARTRATE ER 12.5 MG PO TBCR: 12.5000 mg | EXTENDED_RELEASE_TABLET | Freq: Every evening | ORAL | 0 refills | Status: DC | PRN

## 2018-07-01 NOTE — Telephone Encounter (Signed)
eRx refill sent

## 2018-07-28 ENCOUNTER — Other Ambulatory Visit: Payer: Self-pay | Admitting: Family Medicine

## 2018-08-01 ENCOUNTER — Other Ambulatory Visit: Payer: Self-pay

## 2018-08-01 ENCOUNTER — Telehealth: Payer: No Typology Code available for payment source | Admitting: Physician Assistant

## 2018-08-01 ENCOUNTER — Other Ambulatory Visit: Payer: Self-pay | Admitting: Family Medicine

## 2018-08-01 ENCOUNTER — Encounter: Payer: Self-pay | Admitting: Physician Assistant

## 2018-08-01 ENCOUNTER — Encounter: Payer: Self-pay | Admitting: Family Medicine

## 2018-08-01 DIAGNOSIS — J029 Acute pharyngitis, unspecified: Secondary | ICD-10-CM

## 2018-08-01 MED ORDER — ONDANSETRON HCL 4 MG PO TABS: 4.0000 mg | ORAL_TABLET | Freq: Three times a day (TID) | ORAL | 2 refills | Status: DC | PRN

## 2018-08-01 MED ORDER — ZOLPIDEM TARTRATE ER 12.5 MG PO TBCR: 12.5000 mg | EXTENDED_RELEASE_TABLET | Freq: Every evening | ORAL | 2 refills | Status: DC | PRN

## 2018-08-01 MED ORDER — GABAPENTIN 100 MG PO CAPS: 100.0000 mg | ORAL_CAPSULE | Freq: Three times a day (TID) | ORAL | 3 refills | Status: DC

## 2018-08-01 MED ORDER — VITAMIN D (ERGOCALCIFEROL) 1.25 MG (50000 UNIT) PO CAPS: 50000.0000 [IU] | ORAL_CAPSULE | ORAL | 0 refills | Status: DC

## 2018-08-01 MED ORDER — AMOXICILLIN 500 MG PO CAPS: 500.0000 mg | ORAL_CAPSULE | Freq: Two times a day (BID) | ORAL | 0 refills | Status: DC

## 2018-08-01 NOTE — Progress Notes (Signed)
We are sorry that you are not feeling well.  Here is how we plan to help!  Your symptoms indicate a likely viral infection (Pharyngitis).   Pharyngitis is inflammation in the back of the throat which can cause a sore throat, scratchiness and sometimes difficulty swallowing.   Pharyngitis is typically caused by a respiratory virus and will just run its course.  Please keep in mind that your symptoms could last up to 10 days.   If any worsening of the pain with the swallowing, or if the sore throat persists, you may start taking the prescribed antibiotics- Amoxicillin 500mg  one pill twice daily for 10 days.    For throat pain, we recommend over the counter oral pain relief medications such as acetaminophen or aspirin, or anti-inflammatory medications such as ibuprofen or naproxen sodium.  Topical treatments such as oral throat lozenges or sprays may be used as needed.  Avoid close contact with loved ones, especially the very young and elderly.  Remember to wash your hands thoroughly throughout the day as this is the number one way to prevent the spread of infection and wipe down door knobs and counters with disinfectant.  After careful review of your answers, I would not recommend and antibiotic for your condition.  Antibiotics should not be used to treat conditions that we suspect are caused by viruses like the virus that causes the common cold or flu. However, some people can have Strep with atypical symptoms. You may need formal testing in clinic or office to confirm if your symptoms continue or worsen.  Providers prescribe antibiotics to treat infections caused by bacteria. Antibiotics are very powerful in treating bacterial infections when they are used properly.  To maintain their effectiveness, they should be used only when necessary.  Overuse of antibiotics has resulted in the development of super bugs that are resistant to treatment!    Home Care:  Only take medications as instructed by your  medical team.  Do not drink alcohol while taking these medications.  A steam or ultrasonic humidifier can help congestion.  You can place a towel over your head and breathe in the steam from hot water coming from a faucet.  Avoid close contacts especially the very young and the elderly.  Cover your mouth when you cough or sneeze.  Always remember to wash your hands.  Get Help Right Away If:  You develop worsening fever or throat pain.  You develop a severe head ache or visual changes.  Your symptoms persist after you have completed your treatment plan.  Make sure you  Understand these instructions.  Will watch your condition.  Will get help right away if you are not doing well or get worse.  Your e-visit answers were reviewed by a board certified advanced clinical practitioner to complete your personal care plan.  Depending on the condition, your plan could have included both over the counter or prescription medications.  If there is a problem please reply  once you have received a response from your provider.  Your safety is important to [place].  If you have drug allergies check your prescription carefully.    You can use MyChart to ask questions about todays visit, request a non-urgent call back, or ask for a work or school excuse for 24 hours related to this e-Visit. If it has been greater than 24 hours you will need to follow up with your provider, or enter a new e-Visit to address those concerns.  You will get an  e-mail in the next two days asking about your experience.  I hope that your e-visit has been valuable and will speed your recovery. Thank you for using e-visits.   I spent 5-10 minutes on review and completion of this note- Lacy Duverney [facility]

## 2018-08-01 NOTE — Telephone Encounter (Signed)
TA-Pt req zolpidem/per [place] PMP pt is compliant/thx dmf

## 2018-08-14 NOTE — Progress Notes (Signed)
Virtual Visit via Video Note The purpose of this virtual visit is to provide medical care while limiting exposure to the novel coronavirus.    Consent was obtained for video visit:  Yes Answered questions that patient had about telehealth interaction:  Yes I discussed the limitations, risks, security and privacy concerns of performing an evaluation and management service by telemedicine. I also discussed with the patient that there may be a patient responsible charge related to this service. The patient expressed understanding and agreed to proceed.  Pt location: Home Physician Location: Home Name of referring provider:  [name], MD I connected with [name] at patients initiation/request on 08/15/2018 at  9:50 AM EDT by video enabled telemedicine application and verified that I am speaking with the correct person using two identifiers. Pt MRN:  [id number] Pt DOB:  1970/11/09 Video Participants:  [name]   History of Present Illness:  [name] is a 48 year old man who follows up for stroke, headaches and myelopathy.  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  He was referred to neurosurgery and underwent C3-C7 ACDF on 04/08/18.  Gabapentin helps with pain.  Hands are still numb.    He ran out of nortriptyline and headaches have returned.    HISTORY: 1.  Stroke: He was admitted to [facility] from 10/05/2017 to 10/07/2017 after presenting with vertigo. 2 days prior, he began experiencing sensation of room spinning with right arm numbness, paresthesias and weakness and right leg paresthesiasassociated with headache  and photophobia. MRI of the brain revealed a probable punctate acute/early subacute ischemic infarct within the left thalamo-mesencephalic junction. MRA of the head showed no aneurysm or large vessel occlusion or stenosis. Carotid Doppler showed no hemodynamically significant extracranial stenosis. 2D echo demonstrated normal ejection fraction with no cardiac source of emboli. LDL was 76. Hemoglobin A1c was 5.9. He was started on Lipitor 40mg  daily.He was discharged on aspirin 81 mg and Plavix 75 mg daily for 3 weeks, followed by aspirin alone.  2.  Headache: Followingdischarge, he continued to have a constantsevereright-sided pressure-like headache involving the right temple and right side of face associated with right sided facial numbness and tingling. He also noted worsening slurred speech, right upper extremity numbness and weakness. He developed new blurred peripheral vision in his right eye. He also endorsed postural lightheadedness when standing but not vertigo. He presented to the ED on 11/06/17 for further evaluation. Labs were unremarkable. CT of head demonstrated no acute findings. MRI of brain was normal with resolution of prior punctate focus. He was treated with a migraine cocktail and discharged. He had actually stopped the ASA and continued the Plavix.He reportedly had ran out of Plavix for several days. Once he restarted Plavix, the headache resolved. They now occur once a day for 2 hours or until he takes Tylenol.  3.  Myelopathy: Following the stroke, he developed numbness and tingling in both arms and legsfor about a monthHe reports tightness and numbness and tingling in glove distribution up to elbows. He reports tightness between his shoulder blades but no significant neck pain or radicular pain down the arms. He also reports tightness and soreness in his hamstrings and his  legs feel wobbly. He denies low back pain. It is a constant feeling. He reports  aches and soreness in the arms and legs.Labs from 12/28/17 included TSH 2.30, B12 1,113, and elevated CK of 551. He was advised to stop atorvastatin but symptoms have persisted. Labs from 01/04/18 showed reduced CK of 306, CRP 0.1, vitamin D 29.69, negative Lyme IgG and IgM antibodies, and negative RMSF antibody. Repeat CK From 01/14/18 was mildly increased to 334. 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  Findings on testing thought to be incidental and not contributing to his symptoms.  Repeat CK from February 2020 was 149.  He subsequently reported that the paresthesias and tightness sensation involves the left side of arm and torso and a little bit in the leg.   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  Past Medical History: Past Medical History:  Diagnosis Date  . Anxiety    takes Xanax daily prn, panic attacks  . Chronic gastric ulcer with bleeding s/p suture repair 2012 09/29/2010   Head 3 EGD which failed to stop the bleeding. Had exploratory laparotomy on 10/04/2010. Possible cause of the gastric ulcer was NSAID use.   . Constipation    related to pain meds  . Depression   . Dizziness    occasionally  . Dyspnea    walking a bit  . Erosive esophagitis   . External hemorrhoid, bleeding   . Gastric ulcer    history of  . GERD (gastroesophageal reflux disease)    takes Protonix bid  . Hiatal hernia   . History of blood clots 2012   in abdomen  . History of blood transfusion 2012  .  History of blood transfusion   . Incisional hernia s/p open repair w mesh Aug 2013 05/26/2011  . Joint pain    knees  . Nocturia    depends on amount of fluid he drinks  . Pneumonia 2018  . Pre-diabetes   . Primary localized osteoarthritis of left hip 04/24/2014  . Serrated polyp of colon   . Sleep apnea    no cpap  mild no cpap needed  . Stroke ([place])   . Upper GI bleed   . Ventral hernia     Medications: Outpatient Encounter Medications as of 08/15/2018  Medication Sig  . acetaminophen (TYLENOL) 500 MG tablet Take 1,000 mg by mouth every 6 (six) hours as needed for mild pain or headache.  Marland Kitchen amoxicillin (AMOXIL) 500 MG capsule Take 1 capsule (500 mg total) by mouth 2 (two) times daily.  Marland Kitchen aspirin EC 81 MG EC tablet Take 1 tablet (81 mg total) by mouth daily.  . benzonatate (TESSALON PERLES) 100 MG capsule Take 1 capsule (100 mg total) by mouth 3 (three) times daily as needed.  . clonazePAM (KLONOPIN) 0.5 MG tablet Take 1 tablet (0.5 mg total) by mouth at bedtime.  . cyclobenzaprine (FLEXERIL) 10 MG tablet Take 1 tablet (10 mg total) by mouth 3 (three) times daily as needed for muscle spasms.  . fluticasone (FLONASE) 50 MCG/ACT nasal spray Place 2 sprays into both  nostrils daily as needed for allergies.  Marland Kitchen gabapentin (NEURONTIN) 100 MG capsule Take 1 capsule (100 mg total) by mouth 3 (three) times daily.  Marland Kitchen gabapentin (NEURONTIN) 100 MG capsule Take 1 capsule (100 mg total) by mouth 3 (three) times daily.  Marland Kitchen HYDROcodone-acetaminophen (NORCO) 10-325 MG tablet Take 1 tablet by mouth every 6 (six) hours as needed.  Marland Kitchen ipratropium (ATROVENT) 0.06 % nasal spray Place 2 sprays into both nostrils 4 (four) times daily. (Patient taking differently: Place 2 sprays into both nostrils daily as needed for rhinitis. )  . nortriptyline (PAMELOR) 25 MG capsule Take 1 capsule (25 mg total) by mouth at bedtime.  . ondansetron (ZOFRAN) 4 MG tablet Take 1 tablet (4 mg total) by mouth every 8 (eight) hours as  needed for nausea or vomiting.  . pantoprazole (PROTONIX) 40 MG tablet Take 1 tablet (40 mg total) by mouth 2 (two) times daily.  . promethazine-dextromethorphan (PROMETHAZINE-DM) 6.25-15 MG/5ML syrup Take 5 mLs by mouth 3 (three) times daily as needed for cough.  . Vitamin D, Ergocalciferol, (DRISDOL) 1.25 MG (50000 UT) CAPS capsule Take 1 capsule (50,000 Units total) by mouth every 7 (seven) days. On Monday  . zolpidem (AMBIEN CR) 12.5 MG CR tablet Take 1 tablet (12.5 mg total) by mouth at bedtime as needed for sleep.   No facility-administered encounter medications on file as of 08/15/2018.     Allergies: Allergies  Allergen Reactions  . Dilaudid [Hydromorphone Hcl] Rash and Other (See Comments)    Shakey  . Metoclopramide Hcl Hives, Swelling, Rash and Other (See Comments)    Swelling in lips  . Nsaids     H/o bleeding gastric ulcers  . Benadryl [Diphenhydramine]     Causes patient to become hyper and pace    Family History: Family History  Problem Relation Age of Onset  . Diabetes Mother   . Liver disease Mother   . Diabetes Father   . Heart disease Father   . Colon cancer Neg Hx   . Stomach cancer Neg Hx   . Rectal cancer Neg Hx   . Esophageal cancer Neg Hx   . Colon polyps Neg Hx     Social History: Social History   Socioeconomic History  . Marital status: Single    Spouse name: Not on file  . Number of children: 1  . Years of education: 71  . Highest education level: High school graduate  Occupational History  . Occupation: Maintenance - [employer]  Social Needs  . Financial resource strain: Not on file  . Food insecurity    Worry: Not on file    Inability: Not on file  . Transportation needs    Medical: Not on file    Non-medical: Not on file  Tobacco Use  . Smoking status: Never Smoker  . Smokeless tobacco: Current User    Types: Chew  . Tobacco comment: occ-Chew  Substance and Sexual Activity  . Alcohol use: Yes    Alcohol/week: 0.0 standard  drinks    Comment: 09/04/11 "very seldom"  . Drug use: No  . Sexual activity: Yes  Lifestyle  . Physical activity    Days per week: Not on file    Minutes per session: Not on file  . Stress: Not on file  Relationships  . Social Herbalist on phone: Not on file    Gets together: Not on file    Attends religious service: Not on file    Active  member of club or organization: Not on file    Attends meetings of clubs or organizations: Not on file    Relationship status: Not on file  . Intimate partner violence    Fear of current or ex partner: Not on file    Emotionally abused: Not on file    Physically abused: Not on file    Forced sexual activity: Not on file  Other Topics Concern  . Not on file  Social History Narrative   Lives with brother in a one story home.  Has one daughter.  Works in Theatre manager for [employer].  Education: high school.     Observations/Objective:   Temperature (!) 97 F (36.1 C), height 5\' 9"  (1.753 m), weight 210 lb (95.3 kg). No acute distress.  Alert and oriented.  Speech fluent and not dysarthric.  Language intact.  Eyes orthophoric on primary gaze.  Face symmetric.  Assessment and Plan:   1.  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  Cervical spinal stenosis causing myelopathy, s/p C3-C7 ACDF.   3.  Headache, returned  1.  ASA 81mg  daily for secondary stroke prevention 2.  Atorvastatin previously discontinued due to elevated CK.  Will recheck lipid panel and restart statin accordingly 3.  For headache prophylaxis, restart nortriptyline 25mg  at bedtime 4.  For myalgias and pain secondary to myelopathy, gabapentin  5.  Blood pressure control 6.  Follow up in 5 months.  Follow Up Instructions:    -I discussed the assessment and treatment plan with the patient. The patient was provided an opportunity to ask questions and all were answered. The patient agreed with the plan and demonstrated an  understanding of the instructions.   The patient was advised to call back or seek an in-person evaluation if the symptoms worsen or if the condition fails to improve as anticipated.  Total Time spent in visit with the patient was:  17 minutes   Dudley Major, DO

## 2018-08-15 ENCOUNTER — Telehealth (INDEPENDENT_AMBULATORY_CARE_PROVIDER_SITE_OTHER): Payer: No Typology Code available for payment source | Admitting: Neurology

## 2018-08-15 ENCOUNTER — Encounter: Payer: Self-pay | Admitting: Neurology

## 2018-08-15 ENCOUNTER — Other Ambulatory Visit (INDEPENDENT_AMBULATORY_CARE_PROVIDER_SITE_OTHER): Payer: No Typology Code available for payment source

## 2018-08-15 ENCOUNTER — Other Ambulatory Visit: Payer: Self-pay

## 2018-08-15 VITALS — Temp 97.0°F | Ht 69.0 in | Wt 210.0 lb

## 2018-08-15 DIAGNOSIS — G992 Myelopathy in diseases classified elsewhere: Secondary | ICD-10-CM

## 2018-08-15 DIAGNOSIS — I639 Cerebral infarction, unspecified: Secondary | ICD-10-CM | POA: Diagnosis not present

## 2018-08-15 DIAGNOSIS — R519 Headache, unspecified: Secondary | ICD-10-CM

## 2018-08-15 DIAGNOSIS — R6889 Other general symptoms and signs: Secondary | ICD-10-CM

## 2018-08-15 DIAGNOSIS — M4802 Spinal stenosis, cervical region: Secondary | ICD-10-CM

## 2018-08-15 LAB — LIPID PANEL
Cholesterol: 174 mg/dL (ref 0–200)
HDL: 65.7 mg/dL (ref >39.00)
LDL Cholesterol: 83 mg/dL (ref 0–99)
NonHDL: 108.33
Total CHOL/HDL Ratio: 3
Triglycerides: 127 mg/dL (ref 0.0–149.0)
VLDL: 25.4 mg/dL (ref 0.0–40.0)

## 2018-08-15 MED ORDER — NORTRIPTYLINE HCL 25 MG PO CAPS: 25.0000 mg | ORAL_CAPSULE | Freq: Every day | ORAL | 5 refills | Status: DC

## 2018-08-15 NOTE — Addendum Note (Signed)
Addended by: Clois Comber on: 08/15/2018 10:15 AM   Modules accepted: Orders

## 2018-08-16 ENCOUNTER — Telehealth: Payer: Self-pay

## 2018-08-16 DIAGNOSIS — I639 Cerebral infarction, unspecified: Secondary | ICD-10-CM

## 2018-08-16 MED ORDER — SIMVASTATIN 10 MG PO TABS: 10.0000 mg | ORAL_TABLET | Freq: Every day | ORAL | 3 refills | Status: DC

## 2018-08-16 NOTE — Telephone Encounter (Signed)
Patient aware of results and recommendations.  Simvastatin rx sent to pharmacy and lipid ordered.

## 2018-08-16 NOTE — Telephone Encounter (Signed)
-----   Message from Pieter Partridge, DO sent at 08/16/2018 12:07 PM EDT ----- Overall cholesterol looks good but would like the LDL a little lower.  Start simvastatin 10mg  daily.  Recheck lipid panel in 3 months.

## 2018-10-17 ENCOUNTER — Encounter: Payer: Self-pay | Admitting: Family Medicine

## 2018-10-18 ENCOUNTER — Telehealth: Payer: Self-pay

## 2018-10-18 ENCOUNTER — Other Ambulatory Visit: Payer: Self-pay

## 2018-10-18 DIAGNOSIS — E559 Vitamin D deficiency, unspecified: Secondary | ICD-10-CM

## 2018-10-18 MED ORDER — VITAMIN D (ERGOCALCIFEROL) 1.25 MG (50000 UNIT) PO CAPS: 50000.0000 [IU] | ORAL_CAPSULE | ORAL | 0 refills | Status: DC

## 2018-10-18 NOTE — Telephone Encounter (Signed)
I called patient to let him know that order for vitamin D lab was placed. 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

## 2018-10-20 ENCOUNTER — Other Ambulatory Visit: Payer: Self-pay | Admitting: Family Medicine

## 2018-10-22 IMAGING — RF DG ESOPHAGUS
4 series · 16 of 16 positions shown · non-contrast
Comparison: None.

CLINICAL DATA: One day status post reduction of paraesophageal
hiatal hernia, gastropexy and Nissen fundoplication.

EXAM:
ESOPHOGRAM/BARIUM SWALLOW
TECHNIQUE: Single contrast examination was performed using water-soluble
contrast.
FLUOROSCOPY TIME:  Fluoroscopy Time:  1 minutes and 18 seconds.
Radiation Exposure Index (if provided by the fluoroscopic device):
31 mGy.

[Series 1: cp_standard · 0.55mm/px · 4 of 245 frames shown (1 of 4)]
[frame 37/245]
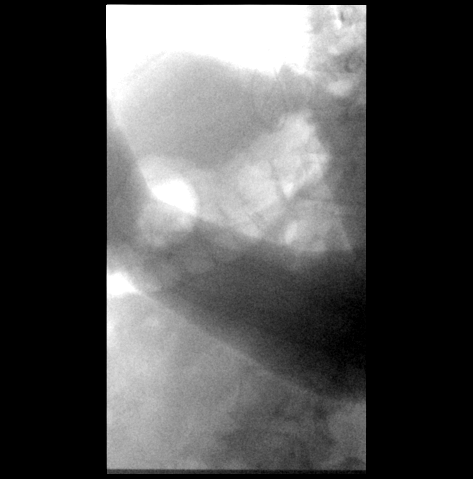
[frame 89/245]
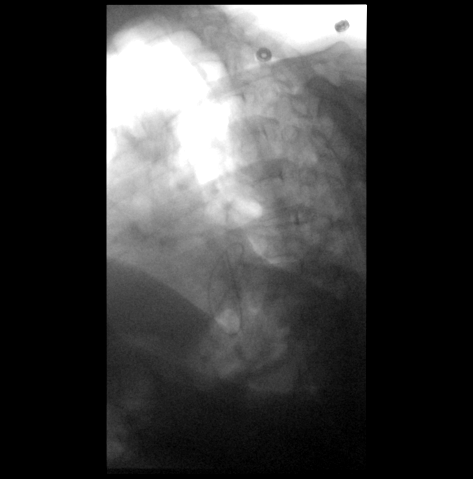
[frame 123/245]
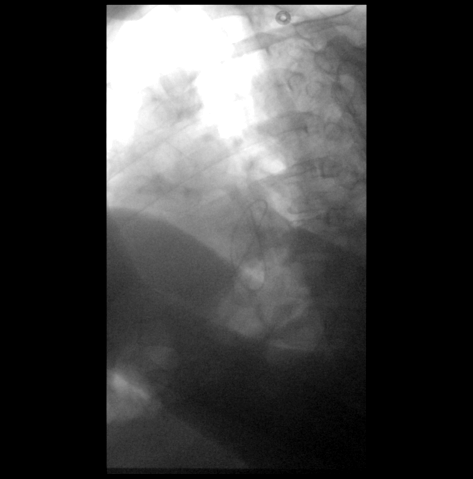
[frame 209/245]
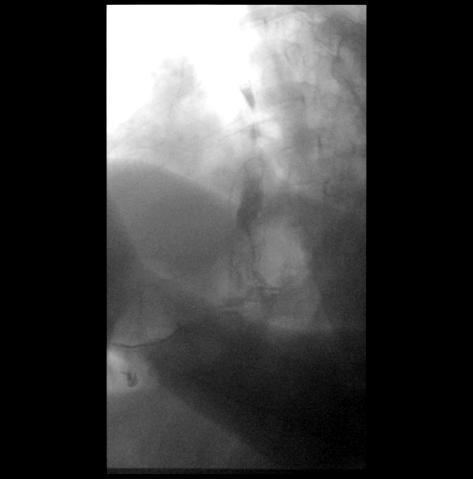

[Series 2: cp_standard · 0.55mm/px · 4 of 262 frames shown (2 of 4)]
[frame 40/262]
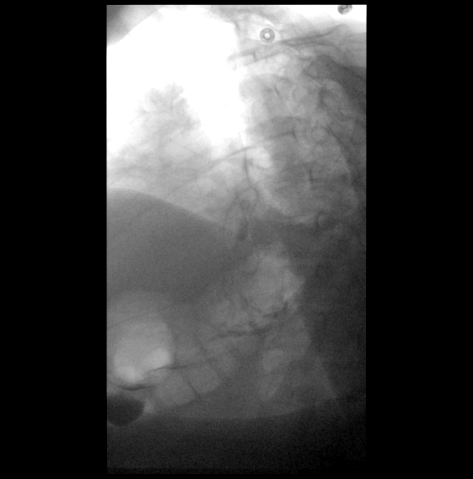
[frame 132/262]
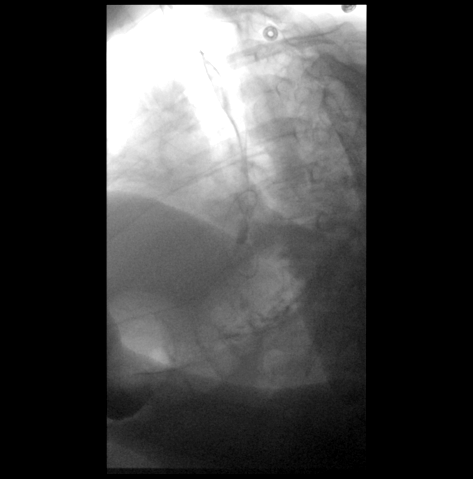
[frame 223/262]
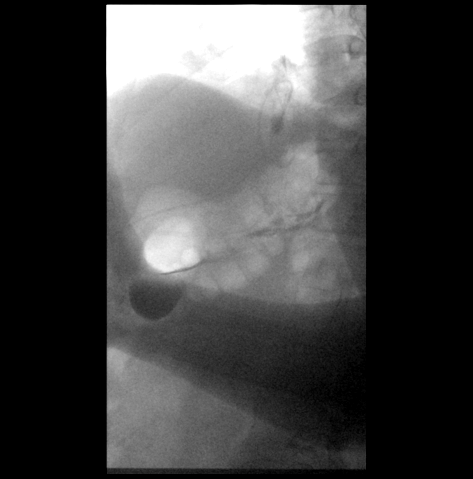
[frame 260/262]
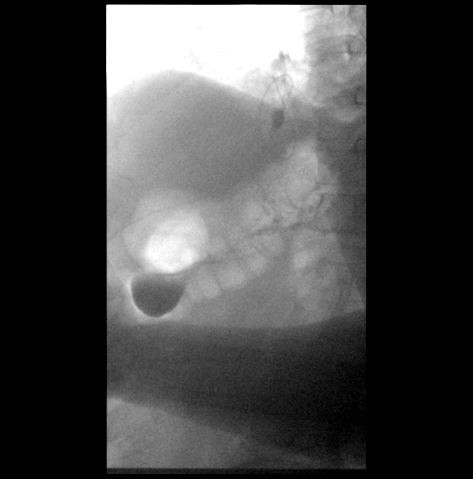

[Series 3: cp_standard · 0.55mm/px · 4 of 266 frames shown (3 of 4)]
[frame 40/266]
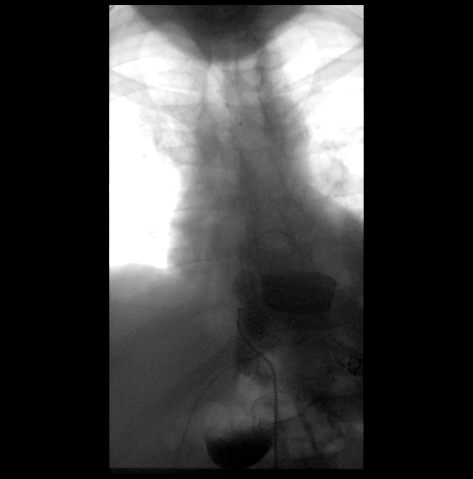
[frame 134/266]
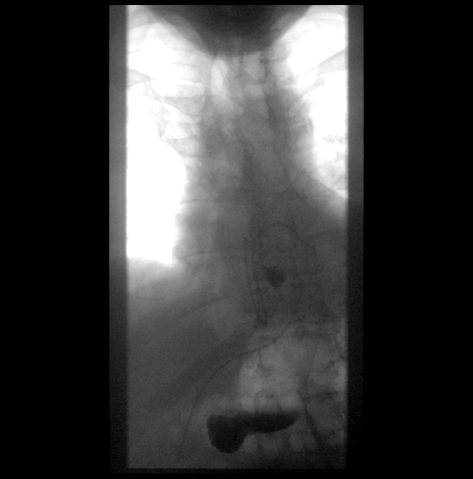
[frame 210/266]
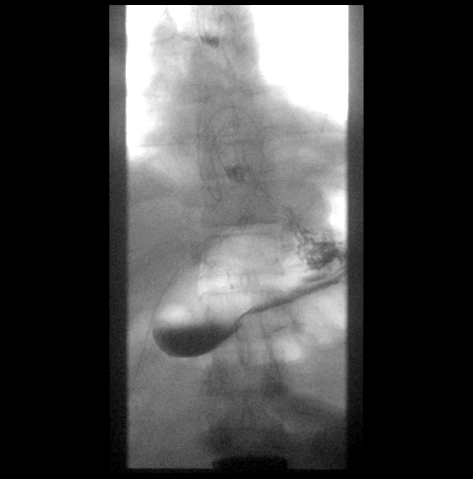
[frame 227/266]
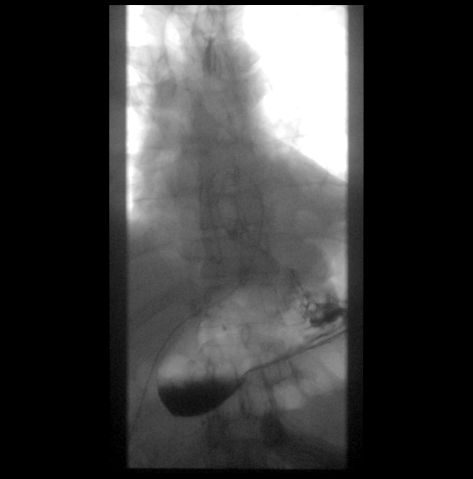

[Series 4: cp_standard · 0.37mm/px · 4 of 214 frames shown (4 of 4)]
[frame 19/214]
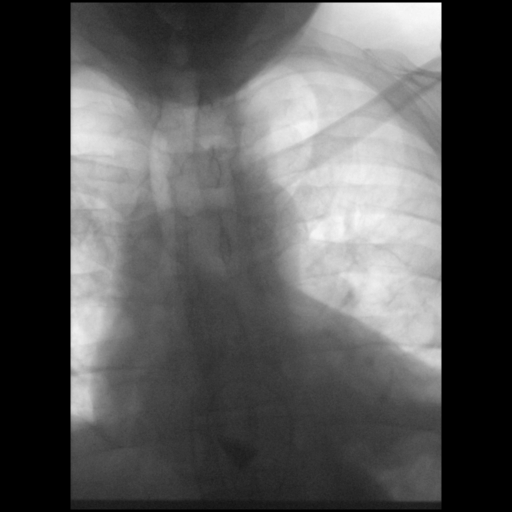
[frame 33/214]
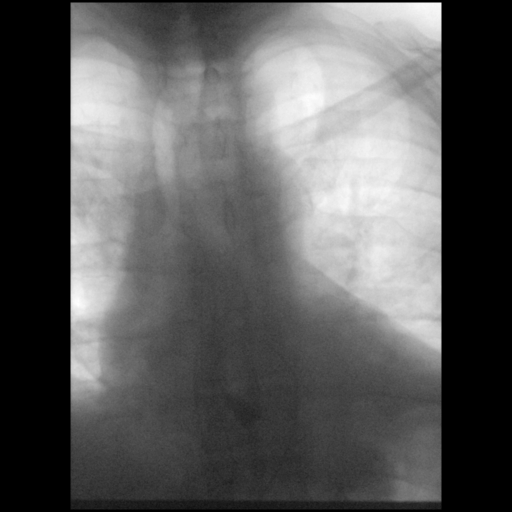
[frame 108/214]
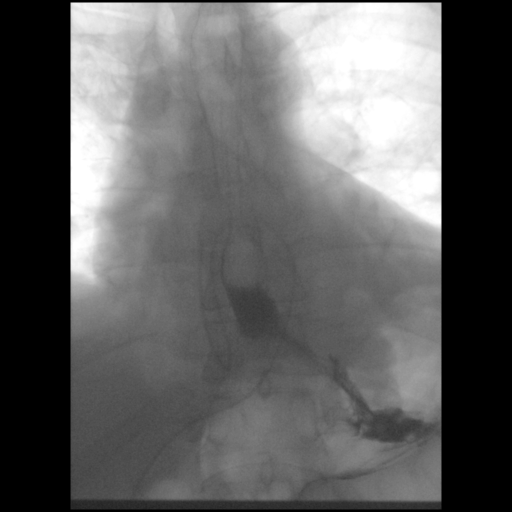
[frame 182/214]
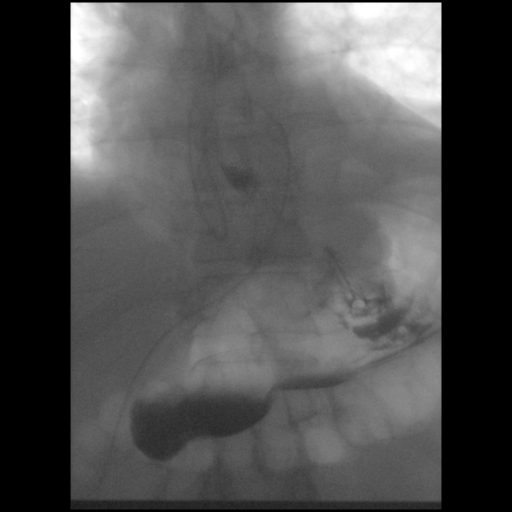

[16 of 16 positions shown; findings below may reference images not displayed]

FINDINGS: In the near upright position, water soluble contrast passes through
the esophagus normally. The Wendee wrap is open. Surgical drain is
present in the lower mediastinum. No evidence of leakage of contrast
material. Contrast enters the stomach normally. No reflux
identified.
IMPRESSION: Unremarkable postoperative study. No evidence of contrast leak or
obstruction.

## 2018-10-24 ENCOUNTER — Encounter (HOSPITAL_COMMUNITY): Payer: Self-pay | Admitting: Emergency Medicine

## 2018-10-24 ENCOUNTER — Other Ambulatory Visit: Payer: Self-pay | Admitting: Family Medicine

## 2018-10-24 ENCOUNTER — Other Ambulatory Visit: Payer: Self-pay

## 2018-10-24 ENCOUNTER — Ambulatory Visit (HOSPITAL_COMMUNITY)
Admission: EM | Admit: 2018-10-24 | Discharge: 2018-10-24 | Disposition: A | Discharge status: Home / Self Care | Payer: PRIVATE HEALTH INSURANCE | Attending: Emergency Medicine | Admitting: Emergency Medicine

## 2018-10-24 DIAGNOSIS — Z23 Encounter for immunization: Secondary | ICD-10-CM | POA: Diagnosis not present

## 2018-10-24 DIAGNOSIS — S61210A Laceration without foreign body of right index finger without damage to nail, initial encounter: Secondary | ICD-10-CM | POA: Diagnosis not present

## 2018-10-24 DIAGNOSIS — W260XXA Contact with knife, initial encounter: Secondary | ICD-10-CM

## 2018-10-24 IMAGING — DX DG CHEST 1V PORT
1 series · 1 of 1 positions shown · non-contrast
Comparison: January 22, 2017

CLINICAL DATA: Chest pain following recent Nissen fundoplication

EXAM:
PORTABLE CHEST 1 VIEW

[chest ap]
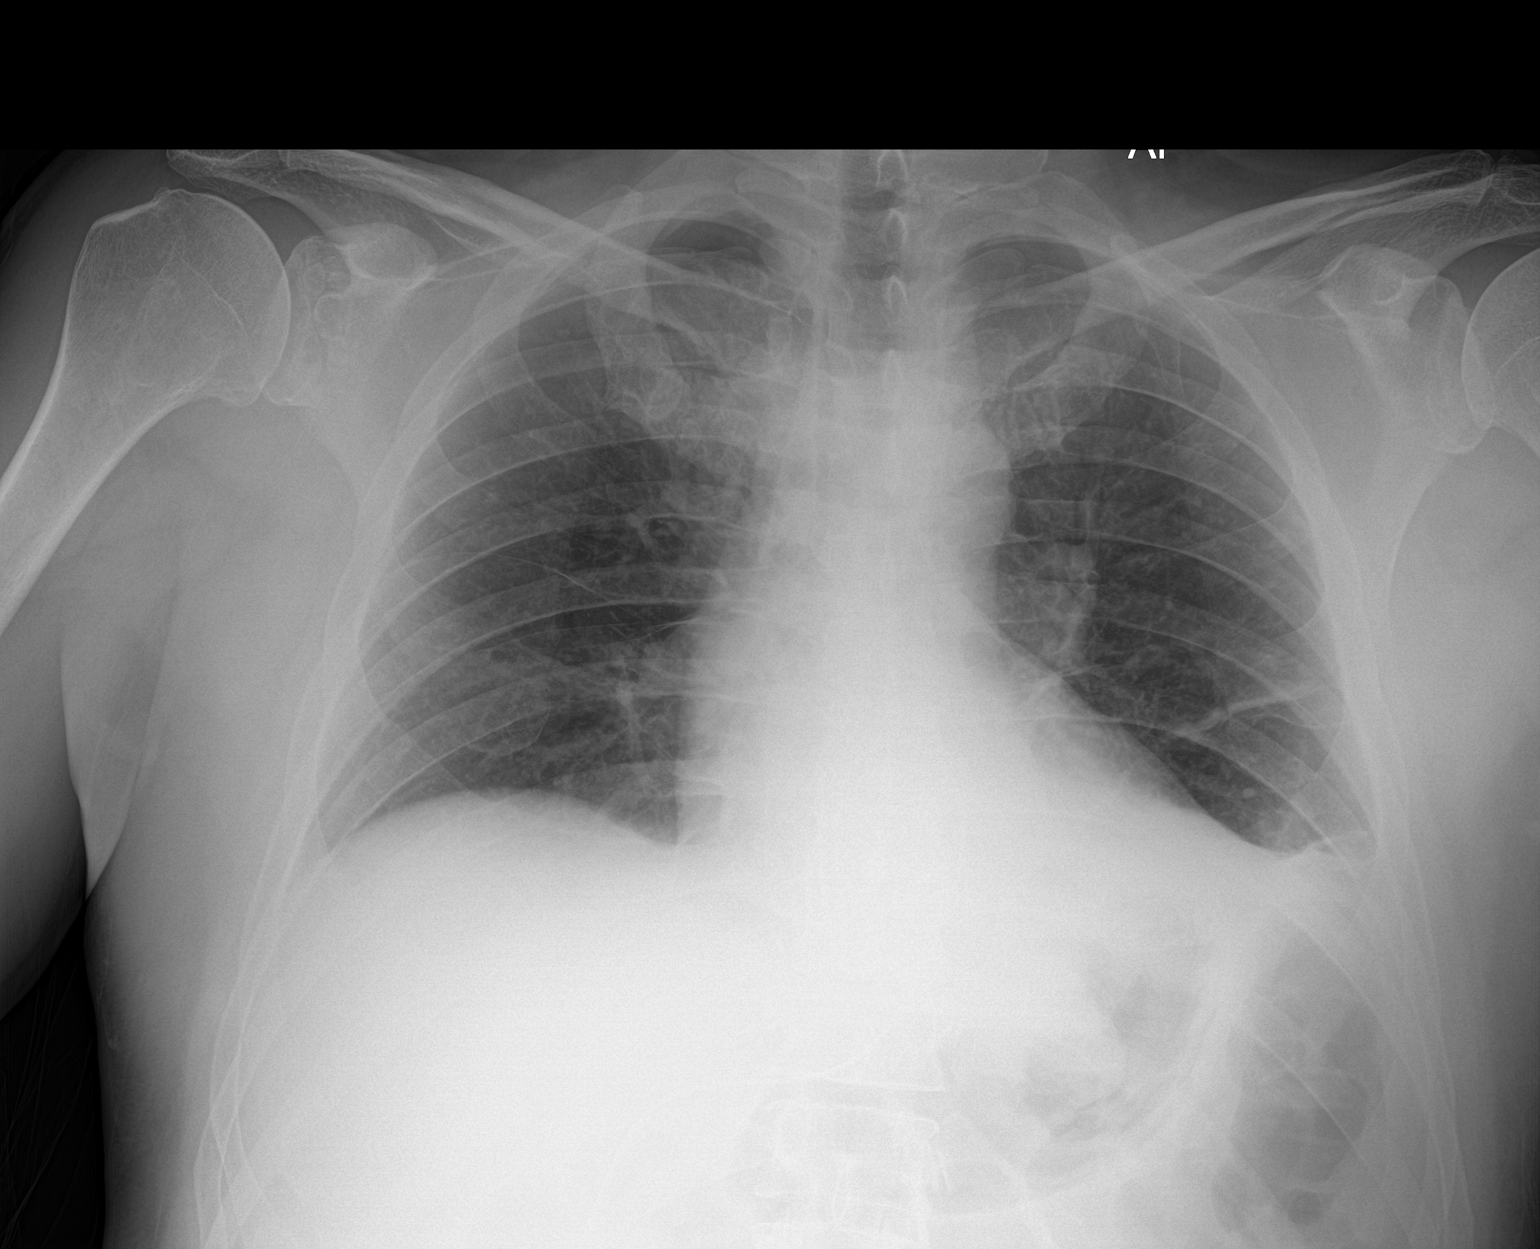

[1 of 1 positions shown; findings below may reference images not displayed]

FINDINGS: There is atelectasis in the left base with small left pleural
effusion. Lungs elsewhere clear. Heart size and pulmonary
vascularity are normal. No adenopathy. No pneumothorax. No bone
lesions.
IMPRESSION: Left base atelectasis with small left pleural effusion. Lungs
elsewhere clear. Cardiac silhouette within normal limits. No evident
adenopathy.

## 2018-10-24 MED ORDER — TETANUS-DIPHTH-ACELL PERTUSSIS 5-2.5-18.5 LF-MCG/0.5 IM SUSP
0.5000 mL | Freq: Once | INTRAMUSCULAR | Status: AC
  Administered 2018-10-24: 0.5 mL via INTRAMUSCULAR

## 2018-10-24 MED ORDER — TETANUS-DIPHTH-ACELL PERTUSSIS 5-2.5-18.5 LF-MCG/0.5 IM SUSP
INTRAMUSCULAR | Status: AC
  Filled 2018-10-24: qty 0.5

## 2018-10-24 NOTE — ED Provider Notes (Signed)
[place]    CSN: [id number] Arrival date & time: 10/24/18  1208      History   Chief Complaint Chief Complaint  Patient presents with  . Laceration    HPI [name] is a 48 y.o. male.   Patient presents with laceration to his right index finger which occurred 45 minutes PTA.  He cut it with a knife while at work.  Bleeding controlled with pressure.  Unknown last tetanus.  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  The history is provided by the patient.    Past Medical History:  Diagnosis Date  . Anxiety    takes Xanax daily prn, panic attacks  . Chronic gastric ulcer with bleeding s/p suture repair 2012 09/29/2010   Head 3 EGD which failed to stop the bleeding. Had exploratory laparotomy on 10/04/2010. Possible cause of the gastric ulcer was NSAID use.   . Constipation    related to pain meds  . Depression   . Dizziness    occasionally  . Dyspnea    walking a bit  . Erosive esophagitis   . External hemorrhoid, bleeding   . Gastric ulcer    history of  . GERD (gastroesophageal reflux disease)    takes Protonix bid  . Hiatal hernia   . History of blood clots 2012   in abdomen  . History of blood transfusion 2012  . History of blood transfusion   . Incisional hernia s/p open repair w mesh Aug 2013 05/26/2011  . Joint pain    knees  . Nocturia    depends on amount of fluid he drinks  . Pneumonia 2018  . Pre-diabetes   . Primary localized osteoarthritis of left hip 04/24/2014  . Serrated polyp of colon   . Sleep apnea    no cpap  mild no cpap needed  . Stroke ([place])   . Upper GI bleed   . Ventral hernia     Patient Active Problem List   Diagnosis Date Noted  . Myelopathy ([place]) 04/08/2018  . Closed low lateral malleolus fracture 02/01/2018  . Paresthesia of upper and lower extremities of both sides 01/03/2018  . Elevated CK 01/03/2018  . Rhabdomyolysis 01/03/2018  . Headache 11/11/2017  . Right sided weakness   .  Vertigo   . Stroke ([place]) 10/05/2017  . Leukocytosis 10/05/2017  . Vitamin D deficiency 08/25/2017  . Hyperkalemia 08/25/2017  . Fatigue 11/23/2016  . Gastroesophageal reflux disease   . Recurrent hiatal hernia s/p redo robotic PEH/Nissen repair 01/22/2017   . Elevated blood pressure reading 12/12/2015  . Difficulty concentrating 06/20/2015  . Insomnia 05/30/2014  . Primary localized osteoarthritis of left hip 04/24/2014  . Elevated hemoglobin A1c 11/17/2012  . Erectile dysfunction 08/08/2012  . History of bleeding gastric ulcer status post repair 2012 (NSAIDs) 05/26/2011  . Anxiety 05/26/2011  . DJD (degenerative joint disease) 10/14/2010    Past Surgical History:  Procedure Laterality Date  . ANTERIOR CERVICAL DECOMPRESSION/DISCECTOMY FUSION 4 LEVELS N/A 04/08/2018   Procedure: ACDF - C3-C4 - C4-C5 - C5-C6 - C6-C7;  Surgeon: Kary Kos, MD;  Location: [facility];  Service: Neurosurgery;  Laterality: N/A;  . COLONOSCOPY    . ESOPHAGEAL MANOMETRY N/A 10/19/2016   Procedure: ESOPHAGEAL MANOMETRY (EM);  Surgeon: Mauri Pole, MD;  Location: [facility];  Service: Endoscopy;  Laterality: N/A;  . ESOPHAGOGASTRODUODENOSCOPY    . ESOPHAGOGASTRODUODENOSCOPY N/A 01/22/2017   Procedure: ESOPHAGOGASTRODUODENOSCOPY (EGD);  Surgeon: [name], MD;  Location: [facility];  Service: General;  Laterality: N/A;  . INCISIONAL HERNIA REPAIR  09/04/2011   Procedure: HERNIA REPAIR INCISIONAL;  Surgeon: Madilyn Hook, DO;  Location: [facility];  Service: General;  Laterality: N/A;  debridment calcified mass  . INSERTION OF MESH  09/04/2011   retrorectus ultrapro "30x30cm"  . INSERTION OF MESH N/A 01/22/2017   Procedure: INSERTION OF MESH;  Surgeon: [name], MD;  Location: [facility];  Service: General;  Laterality: N/A;  . NISSEN FUNDOPLICATION     Open [id number]  . POLYPECTOMY    . STOMACH SURGERY  10/05/2010   Oversewing of gastric ulcer.  Dr Brantley Stage  . TOTAL HIP ARTHROPLASTY Left 04/24/2014   Procedure:  LEFT TOTAL HIP ARTHROPLASTY;  Surgeon: Marchia Bond, MD;  Location: [place];  Service: Orthopedics;  Laterality: Left;  . UPPER GASTROINTESTINAL ENDOSCOPY         Home Medications    Prior to Admission medications   Medication Sig Start Date End Date Taking? Authorizing Provider  acetaminophen (TYLENOL) 500 MG tablet Take 1,000 mg by mouth every 6 (six) hours as needed for mild pain or headache.    [provider]  aspirin EC 81 MG EC tablet Take 1 tablet (81 mg total) by mouth daily. 10/08/17   Domenic Polite, MD  clonazePAM (KLONOPIN) 0.5 MG tablet Take 1 tablet (0.5 mg total) by mouth at bedtime. 05/05/18   [name], MD  cyclobenzaprine (FLEXERIL) 10 MG tablet Take 1 tablet (10 mg total) by mouth 3 (three) times daily as needed for muscle spasms. 04/09/18   Earnie Larsson, MD  fluticasone (FLONASE) 50 MCG/ACT nasal spray Place 2 sprays into both nostrils daily as needed for allergies. 06/14/18   [name], MD  gabapentin (NEURONTIN) 100 MG capsule Take 1 capsule (100 mg total) by mouth 3 (three) times daily. 01/31/18   Pieter Partridge, DO  gabapentin (NEURONTIN) 100 MG capsule Take 1 capsule (100 mg total) by mouth 3 (three) times daily. 08/01/18   Pieter Partridge, DO  HYDROcodone-acetaminophen (NORCO) 10-325 MG tablet Take 1 tablet by mouth every 6 (six) hours as needed. 04/09/18   Earnie Larsson, MD  nortriptyline (PAMELOR) 25 MG capsule Take 1 capsule (25 mg total) by mouth at bedtime. 08/15/18   [name], Adam R, DO  ondansetron (ZOFRAN) 4 MG tablet Take 1 tablet (4 mg total) by mouth every 8 (eight) hours as needed for nausea or vomiting. 08/01/18   [name], MD  pantoprazole (PROTONIX) 40 MG tablet Take 1 tablet (40 mg total) by mouth 2 (two) times daily. 05/12/18   [name], MD  simvastatin (ZOCOR) 10 MG tablet Take 1 tablet (10 mg total) by mouth daily. 08/16/18   Pieter Partridge, DO  Vitamin D, Ergocalciferol, (DRISDOL) 1.25 MG (50000 UT) CAPS capsule Take 1 capsule (50,000 Units  total) by mouth every 7 (seven) days. On Monday 10/18/18   [name], MD  zolpidem (AMBIEN CR) 12.5 MG CR tablet TAKE 1 TABLET BY MOUTH AT BEDTIME AS NEEDED FOR SLEEP. 10/20/18   [name], MD    Family History Family History  Problem Relation Age of Onset  . Diabetes Mother   . Liver disease Mother   . Diabetes Father   . Heart disease Father   . Healthy Sister   . Healthy Brother   . Healthy Brother   . Colon cancer Neg Hx   . Stomach cancer Neg Hx   .  Rectal cancer Neg Hx   . Esophageal cancer Neg Hx   . Colon polyps Neg Hx     Social History Social History   Tobacco Use  . Smoking status: Never Smoker  . Smokeless tobacco: Current User    Types: Chew  . Tobacco comment: occ-Chew  Substance Use Topics  . Alcohol use: Yes    Alcohol/week: 0.0 standard drinks    Comment: 09/04/11 "very seldom"  . Drug use: No     Allergies   Dilaudid [hydromorphone hcl], Metoclopramide hcl, Nsaids, and Benadryl [diphenhydramine]   Review of Systems Review of Systems  Constitutional: Negative for chills and fever.  HENT: Negative for ear pain and sore throat.   Eyes: Negative for pain and visual disturbance.  Respiratory: Negative for cough and shortness of breath.   Cardiovascular: Negative for chest pain and palpitations.  Gastrointestinal: Negative for abdominal pain and vomiting.  Genitourinary: Negative for dysuria and hematuria.  Musculoskeletal: Negative for arthralgias and back pain.  Skin: Positive for wound. Negative for color change and rash.  Neurological: Negative for seizures and syncope.  All other systems reviewed and are negative.    Physical Exam Triage Vital Signs ED Triage Vitals  Enc Vitals Group     BP 10/24/18 1237 133/85     Pulse Rate 10/24/18 1237 (!) 108     Resp 10/24/18 1237 18     Temp 10/24/18 1237 98.7 F (37.1 C)     Temp Source 10/24/18 1237 Oral     SpO2 10/24/18 1237 100 %     Weight --      Height --      Head Circumference  --      Peak Flow --      Pain Score 10/24/18 1238 8     Pain Loc --      Pain Edu? --      Excl. in [place]? --    No data found.  Updated Vital Signs BP 133/85 (BP Location: Right Arm)   Pulse (!) 108   Temp 98.7 F (37.1 C) (Oral)   Resp 18   SpO2 100%   Visual Acuity Right Eye Distance:   Left Eye Distance:   Bilateral Distance:    Right Eye Near:   Left Eye Near:    Bilateral Near:     Physical Exam Vitals signs and nursing note reviewed.  Constitutional:      Appearance: He is well-developed.  HENT:     Head: Normocephalic and atraumatic.  Eyes:     Conjunctiva/sclera: Conjunctivae normal.  Neck:     Musculoskeletal: Neck supple.  Cardiovascular:     Rate and Rhythm: Normal rate and regular rhythm.     Heart sounds: No murmur.  Pulmonary:     Effort: Pulmonary effort is normal. No respiratory distress.     Breath sounds: Normal breath sounds.  Abdominal:     Palpations: Abdomen is soft.     Tenderness: There is no abdominal tenderness.  Skin:    General: Skin is warm and dry.     Capillary Refill: Capillary refill takes less than 2 seconds.     Comments: 4 cm laceration on right index finger.  No active bleeding.   Neurological:     Mental Status: He is alert and oriented to person, place, and time.     Motor: No weakness.      UC Treatments / Results  Labs (all labs ordered are listed, but only abnormal  results are displayed) Labs Reviewed - No data to display  EKG   Radiology No results found.  Procedures Laceration Repair  Date/Time: 10/24/2018 1:17 PM Performed by: Sharion Balloon, NP Authorized by: Sharion Balloon, NP   Consent:    Consent obtained:  Verbal   Consent given by:  Patient Anesthesia (see MAR for exact dosages):    Anesthesia method:  Local infiltration   Local anesthetic:  Lidocaine 2% w/o epi Laceration details:    Location:  Finger   Finger location:  R index finger   Length (cm):  4   Depth (mm):  2 Pre-procedure  details:    Preparation:  Patient was prepped and draped in usual sterile fashion Exploration:    Hemostasis achieved with:  Direct pressure   Wound exploration: wound explored through full range of motion and entire depth of wound probed and visualized   Treatment:    Area cleansed with:  Shur-Clens   Amount of cleaning:  Standard   Irrigation solution:  Sterile saline   Irrigation method:  Syringe   Visualized foreign bodies/material removed: no   Skin repair:    Repair method:  Sutures   Suture size:  4-0   Suture material:  Nylon   Number of sutures:  7 Approximation:    Approximation:  Close Post-procedure details:    Dressing:  Antibiotic ointment, non-adherent dressing and sterile dressing   Patient tolerance of procedure:  Tolerated well, no immediate complications   (including critical care time)  Medications Ordered in UC Medications  Tdap (BOOSTRIX) injection 0.5 mL (has no administration in time range)    Initial Impression / Assessment and Plan / UC Course  I have reviewed the triage vital signs and the nursing notes.  Pertinent labs & imaging results that were available during my care of the patient were reviewed by me and considered in my medical decision making (see chart for details).    Right index finger laceration.  Tetanus updated today.  Wound care instructions and signs of infection discussed at length with patient.  Instructed him to return here or follow-up with his PCP for suture removal in 7 to 10 days.  Instructed him to return here if he notes signs of infection.  Patient agrees to plan of care.     Final Clinical Impressions(s) / UC Diagnoses   Final diagnoses:  Laceration of right index finger without foreign body without damage to nail, initial encounter     Discharge Instructions     Keep your wound clean and dry.  Wash it gently twice a day with soap and water.    Return here if you see signs of infection, such as increased pain,  redness, pus-like drainage, warmth, fever, chills, or other concerning symptoms.    You received a tetanus shot today.         ED Prescriptions    None     PDMP not reviewed this encounter.   Sharion Balloon, NP 10/24/18 1322

## 2018-10-24 NOTE — ED Triage Notes (Signed)
Pt here with laceration to right index finger today with knife

## 2018-10-24 NOTE — Discharge Instructions (Addendum)
Keep your wound clean and dry.  Wash it gently twice a day with soap and water.    Return here if you see signs of infection, such as increased pain, redness, pus-like drainage, warmth, fever, chills, or other concerning symptoms.    You received a tetanus shot today.

## 2018-10-25 ENCOUNTER — Other Ambulatory Visit (INDEPENDENT_AMBULATORY_CARE_PROVIDER_SITE_OTHER): Payer: No Typology Code available for payment source

## 2018-10-25 ENCOUNTER — Other Ambulatory Visit: Payer: Self-pay

## 2018-10-25 ENCOUNTER — Encounter: Payer: Self-pay | Admitting: Family Medicine

## 2018-10-25 DIAGNOSIS — E559 Vitamin D deficiency, unspecified: Secondary | ICD-10-CM

## 2018-10-25 LAB — VITAMIN D 25 HYDROXY (VIT D DEFICIENCY, FRACTURES): VITD: 26.46 ng/mL — ABNORMAL LOW (ref 30.00–100.00)

## 2018-10-26 NOTE — Telephone Encounter (Signed)
TA-This pt has not been seen by you this year/plz advis/thx dmf

## 2018-10-26 NOTE — Telephone Encounter (Signed)
Awaiting rresponse from PCP/thx dmf

## 2018-10-27 ENCOUNTER — Encounter: Payer: Self-pay | Admitting: Family Medicine

## 2018-10-28 ENCOUNTER — Encounter: Payer: Self-pay | Admitting: Family Medicine

## 2018-10-28 ENCOUNTER — Other Ambulatory Visit: Payer: Self-pay | Admitting: Family Medicine

## 2018-10-28 MED ORDER — CLONAZEPAM 0.5 MG PO TABS: 0.5000 mg | ORAL_TABLET | Freq: Every day | ORAL | 0 refills | Status: DC

## 2018-11-02 ENCOUNTER — Other Ambulatory Visit: Payer: Self-pay | Admitting: Student

## 2018-11-02 DIAGNOSIS — G959 Disease of spinal cord, unspecified: Secondary | ICD-10-CM

## 2018-11-09 ENCOUNTER — Ambulatory Visit
Admission: RE | Admit: 2018-11-09 | Discharge: 2018-11-09 | Disposition: A | Discharge status: Home / Self Care | Payer: No Typology Code available for payment source | Source: Ambulatory Visit | Attending: Student | Admitting: Student

## 2018-11-09 ENCOUNTER — Other Ambulatory Visit: Payer: Self-pay

## 2018-11-09 DIAGNOSIS — G959 Disease of spinal cord, unspecified: Secondary | ICD-10-CM

## 2018-11-23 ENCOUNTER — Other Ambulatory Visit: Payer: Self-pay | Admitting: Family Medicine

## 2018-11-24 ENCOUNTER — Encounter: Payer: Self-pay | Admitting: Family Medicine

## 2018-11-24 ENCOUNTER — Other Ambulatory Visit: Payer: Self-pay

## 2018-11-24 ENCOUNTER — Other Ambulatory Visit: Payer: Self-pay | Admitting: Family Medicine

## 2018-11-24 MED ORDER — CLONAZEPAM 0.5 MG PO TABS: 0.5000 mg | ORAL_TABLET | Freq: Every day | ORAL | 5 refills | Status: DC

## 2018-11-24 NOTE — Telephone Encounter (Signed)
TA-Plz see refill request for Clonazepam/Per [place] PMP pt is compliant no red flags/last filled 9.25.20/prepared and pending your approval/thx dmf

## 2018-12-01 ENCOUNTER — Other Ambulatory Visit: Payer: Self-pay | Admitting: Neurology

## 2018-12-01 ENCOUNTER — Telehealth: Payer: Self-pay | Admitting: Neurology

## 2018-12-01 NOTE — Telephone Encounter (Signed)
Requested Prescriptions   Pending Prescriptions Disp Refills  . gabapentin (NEURONTIN) 100 MG capsule [Pharmacy Med Name: GABAPENTIN 100 MG CAPS 100 CAP] 90 capsule 3    Sig: TAKE 1 CAPSULE (100 MG TOTAL) BY MOUTH 3 (THREE) TIMES DAILY.   Rx last filled:08/01/18 #90 3 refills  Pt last seen: 08/15/18   Follow up appt scheduled:NONE

## 2018-12-01 NOTE — Telephone Encounter (Signed)
Rx sent 

## 2018-12-01 NOTE — Telephone Encounter (Signed)
Patient needs a Refill on the neurontin  Called into the [facility] pharmacy

## 2018-12-01 NOTE — Telephone Encounter (Signed)
Refill request came via surescript

## 2018-12-09 ENCOUNTER — Other Ambulatory Visit: Payer: Self-pay | Admitting: Family Medicine

## 2018-12-12 NOTE — Telephone Encounter (Signed)
Last fill 08/01/18  #20/2 Next OV 12/15/18

## 2018-12-19 ENCOUNTER — Ambulatory Visit: Payer: No Typology Code available for payment source | Admitting: Family Medicine

## 2018-12-20 ENCOUNTER — Encounter: Payer: Self-pay | Admitting: Family Medicine

## 2018-12-20 ENCOUNTER — Other Ambulatory Visit: Payer: Self-pay | Admitting: Family Medicine

## 2018-12-20 NOTE — Telephone Encounter (Signed)
Last fill 10/20/18  #30/1 Last OV 02/08/18 Pt need a follow up visit

## 2018-12-21 DIAGNOSIS — E785 Hyperlipidemia, unspecified: Secondary | ICD-10-CM | POA: Insufficient documentation

## 2018-12-21 DIAGNOSIS — F1310 Sedative, hypnotic or anxiolytic abuse, uncomplicated: Secondary | ICD-10-CM | POA: Insufficient documentation

## 2018-12-21 DIAGNOSIS — Z79899 Other long term (current) drug therapy: Secondary | ICD-10-CM | POA: Insufficient documentation

## 2018-12-21 DIAGNOSIS — I69351 Hemiplegia and hemiparesis following cerebral infarction affecting right dominant side: Secondary | ICD-10-CM | POA: Insufficient documentation

## 2018-12-21 DIAGNOSIS — E1169 Type 2 diabetes mellitus with other specified complication: Secondary | ICD-10-CM | POA: Insufficient documentation

## 2018-12-21 NOTE — Assessment & Plan Note (Signed)
Taking nortriptyline 25 mg qhs for insomnia and headache prevention.

## 2018-12-21 NOTE — Assessment & Plan Note (Addendum)
Updated UDS/CSC today. I am concerned about his overuse of Clonazepam and ambien- PDMP reviewed- he is getting both filled regulalry.

## 2018-12-21 NOTE — Assessment & Plan Note (Signed)
Tolerating statin, encouraged heart healthy diet, avoid trans fats, minimize simple carbs and saturated fats. Increase exercise as tolerated Lab Results  Component Value Date   CHOL 174 08/15/2018   HDL 65.70 08/15/2018   LDLCALC 83 08/15/2018   TRIG 127.0 08/15/2018   CHOLHDL 3 08/15/2018

## 2018-12-21 NOTE — Progress Notes (Signed)
Subjective:    Patient ID: [name], male    DOB: 1970/03/15, 48 y.o.   MRN: [id number]  Chief Complaint  Patient presents with  . chornic pain    med refills/pt will have vital sign to share during ov  . Anxiety    med refills/screening done  . history of CVA  . vitamin D deficiency      Virtual Visit via Video   Due to the COVID-19 pandemic, this visit was completed with telemedicine (audio/video) technology to reduce patient and provider exposure as well as to preserve personal protective equipment.   I connected with [name] by a video enabled telemedicine application and verified that I am speaking with the correct person using two identifiers. Location patient: Home Location provider: [facility], Office Persons participating in the virtual visit: Jeromie, Meziere, MD   I discussed the limitations of evaluation and management by telemedicine and the availability of in person appointments. The patient expressed understanding and agreed to proceed.  Care Team   Patient Care Team: [name], MD as PCP - General (Family Medicine) [name], MD as Consulting Physician (General Surgery) [name], [name], MD as Consulting Physician (Gastroenterology) [name], NP as Nurse Practitioner (Internal Medicine) Pieter Partridge, DO as Consulting Physician (Neurology)  Subjective:   HPI:   Complicated medical history, including h/o CVA with residual right sided weakness and heaches, insomnia, anxiety, myelopathy, vitamin D deficiency.  Still having tingling in his legs and arms.  Currently taking Gabapentin 100 mg three times daily which initially worked but not it is now.  He has been more on edge and depressed- denies SI or HI.  He thinks its a combination of things- the stroke, pain, covid. GAD 7 : Generalized Anxiety Score 12/22/2018  Nervous, Anxious, on Edge 1  Control/stop worrying 1  Worry too much - different things 3  Trouble relaxing  1  Restless 1  Easily annoyed or irritable 1  Afraid - awful might happen 1  Total GAD 7 Score 9     Depression screen [facility] 2/9 12/22/2018 12/22/2018 01/03/2018 11/23/2016  Decreased Interest 1 0 0 0  Down, Depressed, Hopeless 1 0 0 0  PHQ - 2 Score 2 0 0 0  Altered sleeping 1 - - -  Tired, decreased energy 3 - - -  Change in appetite 1 - - -  Feeling bad or failure about yourself  0 - - -  Trouble concentrating 3 - - -  Moving slowly or fidgety/restless 1 - - -  Suicidal thoughts 0 - - -  PHQ-9 Score 11 - - -  Some recent data might be hidden   Insomnia, anxiety-Tried trazodone. For anxiety, he was taking Buspar and lexapro in past but stopped taking both of them.  Felt they weren't helpful.  Taking ambien and klonipin.  Depression screen [facility] 2/9 12/22/2018 12/22/2018 01/03/2018 11/23/2016  Decreased Interest 1 0 0 0  Down, Depressed, Hopeless 1 0 0 0  PHQ - 2 Score 2 0 0 0  Altered sleeping 1 - - -  Tired, decreased energy 3 - - -  Change in appetite 1 - - -  Feeling bad or failure about yourself  0 - - -  Trouble concentrating 3 - - -  Moving slowly or fidgety/restless 1 - - -  Suicidal thoughts 0 - - -  PHQ-9 Score 11 - - -  Some recent data might  be hidden   GAD 7 : Generalized Anxiety Score 12/22/2018  Nervous, Anxious, on Edge 1  Control/stop worrying 1  Worry too much - different things 3  Trouble relaxing 1  Restless 1  Easily annoyed or irritable 1  Afraid - awful might happen 1  Total GAD 7 Score 9     Also takes tramadol for pain.   Review of Systems  Constitutional: Negative for fever and malaise/fatigue.  HENT: Negative for congestion and hearing loss.   Eyes: Negative for blurred vision, discharge and redness.  Respiratory: Negative for cough and shortness of breath.   Cardiovascular: Negative for chest pain, palpitations and leg swelling.  Gastrointestinal: Negative for abdominal pain and heartburn.  Genitourinary: Negative for dysuria.   Musculoskeletal: Positive for myalgias and neck pain. Negative for falls.  Skin: Negative for rash.  Neurological: Positive for tingling. Negative for loss of consciousness and headaches.  Endo/Heme/Allergies: Does not bruise/bleed easily.  Psychiatric/Behavioral: Negative for depression, hallucinations, memory loss, substance abuse and suicidal ideas. The patient is nervous/anxious and has insomnia.   All other systems reviewed and are negative.    Patient Active Problem List   Diagnosis Date Noted  . HLD (hyperlipidemia) 12/21/2018  . Long-term current use of benzodiazepine 12/21/2018  . Ambien use disorder, mild ([place]) 12/21/2018  . Hemiplegia and hemiparesis following cerebral infarction affecting right dominant side ([place]) 12/21/2018  . Myelopathy ([place]) 04/08/2018  . Closed low lateral malleolus fracture 02/01/2018  . Paresthesia of upper and lower extremities of both sides 01/03/2018  . Elevated CK 01/03/2018  . Headache 11/11/2017  . Right sided weakness   . Vertigo   . Stroke ([place]) 10/05/2017  . Leukocytosis 10/05/2017  . Vitamin D deficiency 08/25/2017  . Hyperkalemia 08/25/2017  . Fatigue 11/23/2016  . Gastroesophageal reflux disease   . Recurrent hiatal hernia s/p redo robotic PEH/Nissen repair 01/22/2017   . Elevated blood pressure reading 12/12/2015  . Difficulty concentrating 06/20/2015  . Insomnia 05/30/2014  . Primary localized osteoarthritis of left hip 04/24/2014  . Elevated hemoglobin A1c 11/17/2012  . Erectile dysfunction 08/08/2012  . History of bleeding gastric ulcer status post repair 2012 (NSAIDs) 05/26/2011  . Anxiety 05/26/2011  . DJD (degenerative joint disease) 10/14/2010    Social History   Tobacco Use  . Smoking status: Never Smoker  . Smokeless tobacco: Current User    Types: Chew  . Tobacco comment: occ-Chew  Substance Use Topics  . Alcohol use: Yes    Alcohol/week: 0.0 standard drinks    Comment: 09/04/11 "very seldom"    Current  Outpatient Medications:  .  acetaminophen (TYLENOL) 500 MG tablet, Take 1,000 mg by mouth every 6 (six) hours as needed for mild pain or headache., Disp: , Rfl:  .  aspirin EC 81 MG EC tablet, Take 1 tablet (81 mg total) by mouth daily., Disp: , Rfl:  .  clonazePAM (KLONOPIN) 0.5 MG tablet, Take 1 tablet (0.5 mg total) by mouth at bedtime., Disp: 30 tablet, Rfl: 5 .  cyclobenzaprine (FLEXERIL) 10 MG tablet, Take 1 tablet (10 mg total) by mouth 3 (three) times daily as needed for muscle spasms., Disp: 30 tablet, Rfl: 0 .  fluticasone (FLONASE) 50 MCG/ACT nasal spray, Place 2 sprays into both nostrils daily as needed for allergies., Disp: 16 g, Rfl: 11 .  HYDROcodone-acetaminophen (NORCO) 10-325 MG tablet, Take 1 tablet by mouth every 6 (six) hours as needed., Disp: 30 tablet, Rfl: 0 .  nortriptyline (PAMELOR) 25 MG capsule, Take 1  capsule (25 mg total) by mouth at bedtime., Disp: 30 capsule, Rfl: 5 .  ondansetron (ZOFRAN) 4 MG tablet, TAKE 1 TABLET BY MOUTH EVERY 8 HOURS AS NEEDED FOR NAUSEA OR VOMITING., Disp: 20 tablet, Rfl: 1 .  pantoprazole (PROTONIX) 40 MG tablet, Take 1 tablet (40 mg total) by mouth 2 (two) times daily., Disp: 180 tablet, Rfl: 2 .  simvastatin (ZOCOR) 10 MG tablet, Take 1 tablet (10 mg total) by mouth daily., Disp: 90 tablet, Rfl: 3 .  Vitamin D, Ergocalciferol, (DRISDOL) 1.25 MG (50000 UT) CAPS capsule, Take 1 capsule (50,000 Units total) by mouth every 7 (seven) days. On Monday, Disp: 12 capsule, Rfl: 0 .  zolpidem (AMBIEN CR) 12.5 MG CR tablet, Take 1 tablet (12.5 mg total) by mouth at bedtime as needed. for sleep, Disp: 30 tablet, Rfl: 1 .  gabapentin (NEURONTIN) 300 MG capsule, Take 1 capsule (300 mg total) by mouth 3 (three) times daily., Disp: 90 capsule, Rfl: 3  Allergies  Allergen Reactions  . Dilaudid [Hydromorphone Hcl] Rash and Other (See Comments)    Shakey  . Metoclopramide Hcl Hives, Swelling, Rash and Other (See Comments)    Swelling in lips  . Nsaids      H/o bleeding gastric ulcers  . Benadryl [Diphenhydramine]     Causes patient to become hyper and pace       Objective:    Ht 5\' 9"  (1.753 m)   BMI 31.01 kg/m   Physical Exam Vitals signs and nursing note reviewed.  Constitutional:      Appearance: Normal appearance.  HENT:     Head: Normocephalic and atraumatic.     Right Ear: External ear normal.     Left Ear: External ear normal.     Mouth/Throat:     Mouth: Mucous membranes are moist.  Eyes:     Extraocular Movements: Extraocular movements intact.  Neck:     Musculoskeletal: Normal range of motion.  Cardiovascular:     Rate and Rhythm: Normal rate.  Pulmonary:     Effort: Pulmonary effort is normal.     Breath sounds: Normal breath sounds.  Skin:    General: Skin is warm.  Neurological:     General: No focal deficit present.     Mental Status: He is alert. Mental status is at baseline.  Psychiatric:        Mood and Affect: Mood normal.        Thought Content: Thought content normal.        Judgment: Judgment normal.     Ht 5\' 9"  (1.753 m)   BMI 31.01 kg/m  Wt Readings from Last 3 Encounters:  08/15/18 210 lb (95.3 kg)  04/08/18 207 lb 3.7 oz (94 kg)  04/05/18 209 lb 1.6 oz (94.8 kg)    Diabetic Foot Exam - Simple   No data filed     Lab Results  Component Value Date   WBC 10.0 04/05/2018   HGB 14.2 04/05/2018   HCT 44.0 04/05/2018   PLT 347 04/05/2018   GLUCOSE 95 04/05/2018   CHOL 174 08/15/2018   TRIG 127.0 08/15/2018   HDL 65.70 08/15/2018   LDLCALC 83 08/15/2018   ALT 32 01/03/2018   AST 21 01/03/2018   NA 136 04/05/2018   K 5.1 04/05/2018   CL 106 04/05/2018   CREATININE 0.91 04/05/2018   BUN 16 04/05/2018   CO2 20 (L) 04/05/2018   TSH 2.30 12/28/2017   PSA 1.87 04/14/2017   INR  0.98 11/06/2017   HGBA1C 5.9 (H) 10/06/2017    Lab Results  Component Value Date   TSH 2.30 12/28/2017   Lab Results  Component Value Date   WBC 10.0 04/05/2018   HGB 14.2 04/05/2018   HCT 44.0  04/05/2018   MCV 92.6 04/05/2018   PLT 347 04/05/2018   Lab Results  Component Value Date   NA 136 04/05/2018   K 5.1 04/05/2018   CO2 20 (L) 04/05/2018   GLUCOSE 95 04/05/2018   BUN 16 04/05/2018   CREATININE 0.91 04/05/2018   BILITOT 0.4 01/03/2018   ALKPHOS 57 01/03/2018   AST 21 01/03/2018   ALT 32 01/03/2018   PROT 7.1 01/03/2018   ALBUMIN 4.8 01/03/2018   CALCIUM 9.4 04/05/2018   ANIONGAP 10 04/05/2018   GFR 81.15 01/03/2018   Lab Results  Component Value Date   CHOL 174 08/15/2018   Lab Results  Component Value Date   HDL 65.70 08/15/2018   Lab Results  Component Value Date   LDLCALC 83 08/15/2018   Lab Results  Component Value Date   TRIG 127.0 08/15/2018   Lab Results  Component Value Date   CHOLHDL 3 08/15/2018   Lab Results  Component Value Date   HGBA1C 5.9 (H) 10/06/2017       Assessment & Plan:   Problem List Items Addressed This Visit      Active Problems   Anxiety    Deteriorated. >40 minutes spent in face to face time with patient, >50% spent in counselling or coordination of care discussing anxiety,depression, CVA, vitamin D deficiency, insomnia, radiculopathy, headacehes.  GAD7 and PHQ much worse- he feels this is all situational- being pain, recent stroke.  He wants to try a higher dose of gabapentin before deciding on trying therapy or another antidepressant/anxiolytic.  Advised the importance of using Klonopin sparingly- dependece, tolerance and the risk of decreased respiratory drive.  The patient indicates understanding of these issues and agrees with the plan.       Elevated hemoglobin A1c    Lab Results  Component Value Date   HGBA1C 5.9 (H) 10/06/2017         Insomnia    Difficult issue.  I weaned him off of ambien and placed him on trazodone prior to his stroke.  Since his stroke he feels only [name] provides him any relief and allows him to sleep.  PDMP reviewed- he is due for CSC/UDS.  He is aware to not use  both klonipin and ambien at the same time.  I advised starting with gabapentin and if still awake an hour later, trial of one or the other (ambien or klonipin). The patient indicates understanding of these issues and agrees with the plan.  Call or send my chart message prn if these symptoms worsen or fail to improve as anticipated.       Gastroesophageal reflux disease    Since GIB he is taking protonix 40 mg twice daily.      Vitamin D deficiency - Primary    Lab Results  Component Value Date   VD25OH 26.46 (L) 10/25/2018   VD25OH 29.69 (L) 01/03/2018   VD25OH 24.42 (L) 09/30/2017          Relevant Orders   Vitamin D (25 hydroxy)   Hyperkalemia   Stroke ([place])    With residual deficits, included headaches and right sided weakness.  Last saw Dr. [name] for this on 08/15/18 via virtual visit.  His assessment and plan  was as follows:  Assessment and Plan:   1.  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  Cervical spinal stenosis causing myelopathy, s/p C3-C7 ACDF.   3.  Headache, returned  1.  ASA 81mg  daily for secondary stroke prevention 2.  Atorvastatin previously discontinued due to elevated CK.  Will recheck lipid panel and restart statin accordingly he is now on zocor 10 mg daily. 3.  For headache prophylaxis, restart nortriptyline 25mg  at bedtime 4.  For myalgias and pain secondary to myelopathy, gabapentin  5.  Blood pressure control 6.  Follow up in 5 months.  Pamelor has helped with preventing headaches.      Relevant Orders   Lipid panel   Headache    Taking nortriptyline 25 mg qhs for insomnia and headache prevention.      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  He was referred to neurosurgery and underwent C3-C7 ACDF on 04/08/18.  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  He was referred to neurosurgery and underwent C3-C7 ACDF on 04/08/18.  Gabapentin helped at first daily but he is only taking Gabapentin 300 mg three times da.  Hands are still numb.    CT of cervical spine from 11/09/18 showed:  IMPRESSION: 1. No acute abnormality of the cervical spine. 2. Status post C3 to C7 ACDF without evidence of hardware complication. 3. Moderate left C6-7 neural foraminal stenosis due to uncovertebral hypertrophy.   Will increase Gabapentin to 300 mg three times daily.  Had EMG done last week at [facility]- awaiting results.      Elevated CK   Relevant Orders   CK (Creatine Kinase)   Myelopathy (HCC)   HLD (hyperlipidemia)    Tolerating statin, encouraged heart healthy diet, avoid trans fats, minimize simple carbs and saturated fats. Increase exercise as tolerated Lab Results  Component Value Date   CHOL 174 08/15/2018   HDL 65.70 08/15/2018   LDLCALC 83 08/15/2018   TRIG 127.0 08/15/2018   CHOLHDL 3 08/15/2018         Relevant Orders   Lipid panel   Hepatic function panel   Long-term current use of benzodiazepine   Relevant  Orders   Pain Mgmt, Profile 8 w/Conf, U   Ambien use disorder, mild (HCC)    Updated UDS/CSC today. I am concerned about his overuse of Clonazepam and ambien- PDMP reviewed- he is getting both filled regulalry.        Relevant Orders   Pain Mgmt, Profile 8 w/Conf, U   Hemiplegia and hemiparesis following cerebral infarction affecting right dominant side ([place])      I have  discontinued [name] "Dave"'s gabapentin. I have also changed his zolpidem. Additionally, I am having him start on gabapentin. Lastly, I am having him maintain his aspirin, acetaminophen, HYDROcodone-acetaminophen, cyclobenzaprine, pantoprazole, fluticasone, nortriptyline, simvastatin, Vitamin D (Ergocalciferol), ondansetron, and clonazePAM.  Meds ordered this encounter  Medications  . zolpidem (AMBIEN CR) 12.5 MG CR tablet    Sig: Take 1 tablet (12.5 mg total) by mouth at bedtime as needed. for sleep    Dispense:  30 tablet    Refill:  1  . clonazePAM (KLONOPIN) 0.5 MG tablet    Sig: Take 1 tablet (0.5 mg total) by mouth at bedtime.    Dispense:  30 tablet    Refill:  5  . gabapentin (NEURONTIN) 300 MG capsule    Sig: Take 1 capsule (300 mg total) by mouth 3 (three) times daily.    Dispense:  90 capsule    Refill:  3   There are no diagnoses linked to this encounter.  Marland Kitchen COVID-19 Education: The signs and symptoms of COVID-19 were discussed with the patient and how to seek care for testing if needed. The importance of social distancing was discussed today. . Reviewed expectations re: course of current medical issues. . Discussed self-management of symptoms. . Outlined signs and symptoms indicating need for more acute intervention. . Patient verbalized understanding and all questions were answered. Marland Kitchen Health Maintenance issues including appropriate healthy diet, exercise, and smoking avoidance were discussed with patient. . See orders for this visit as documented in the electronic medical record.  Arnette Norris, MD  Records requested if needed. Time spent:40 minutes, of which >50% was spent in obtaining information about his symptoms, reviewing his previous labs, evaluations, and treatments, counseling him about his condition (please see the discussed topics above), and developing a plan to further investigate it; he had a number of questions which I addressed.   Arnette Norris, MD

## 2018-12-21 NOTE — Assessment & Plan Note (Addendum)
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  He was referred to neurosurgery and underwent C3-C7 ACDF on 04/08/18.  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  He was referred to neurosurgery and underwent C3-C7 ACDF on 04/08/18.  Gabapentin helped at first daily but he is only taking Gabapentin 300 mg three times da.  Hands are still numb.    CT of cervical spine from 11/09/18 showed:  IMPRESSION: 1. No acute abnormality of the cervical spine. 2. Status post C3 to C7 ACDF without evidence of hardware complication. 3. Moderate left C6-7 neural foraminal stenosis due to uncovertebral hypertrophy.   Will increase Gabapentin to 300 mg three times daily.  Had EMG done last week at [place] [facility]- awaiting results.

## 2018-12-21 NOTE — Assessment & Plan Note (Addendum)
With residual deficits, included headaches and right sided weakness.  Last saw Dr. [name] for this on 08/15/18 via virtual visit.  His assessment and plan was as follows:  Assessment and Plan:   1.  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  Cervical spinal stenosis causing myelopathy, s/p C3-C7 ACDF.   3.  Headache, returned  1.  ASA 81mg  daily for secondary stroke prevention 2.  Atorvastatin previously discontinued due to elevated CK.  Will recheck lipid panel and restart statin accordingly he is now on zocor 10 mg daily. 3.  For headache prophylaxis, restart nortriptyline 25mg  at bedtime 4.  For myalgias and pain secondary to myelopathy, gabapentin  5.  Blood pressure control 6.  Follow up in 5 months.  Pamelor has helped with preventing headaches.

## 2018-12-21 NOTE — Assessment & Plan Note (Signed)
Since GIB he is taking protonix 40 mg twice daily.

## 2018-12-21 NOTE — Assessment & Plan Note (Signed)
Lab Results  Component Value Date   HGBA1C 5.9 (H) 10/06/2017

## 2018-12-21 NOTE — Assessment & Plan Note (Signed)
Lab Results  Component Value Date   VD25OH 26.46 (L) 10/25/2018   VD25OH 29.69 (L) 01/03/2018   VD25OH 24.42 (L) 09/30/2017

## 2018-12-21 NOTE — Assessment & Plan Note (Addendum)
Difficult issue.  I weaned him off of ambien and placed him on trazodone prior to his stroke.  Since his stroke he feels only [name] provides him any relief and allows him to sleep.  PDMP reviewed- he is due for CSC/UDS.  He is aware to not use both klonipin and ambien at the same time.  I advised starting with gabapentin and if still awake an hour later, trial of one or the other (ambien or klonipin). The patient indicates understanding of these issues and agrees with the plan.  Call or send my chart message prn if these symptoms worsen or fail to improve as anticipated.

## 2018-12-22 ENCOUNTER — Other Ambulatory Visit (INDEPENDENT_AMBULATORY_CARE_PROVIDER_SITE_OTHER): Payer: No Typology Code available for payment source

## 2018-12-22 ENCOUNTER — Encounter: Payer: Self-pay | Admitting: Family Medicine

## 2018-12-22 ENCOUNTER — Other Ambulatory Visit: Payer: Self-pay

## 2018-12-22 ENCOUNTER — Ambulatory Visit (INDEPENDENT_AMBULATORY_CARE_PROVIDER_SITE_OTHER): Payer: No Typology Code available for payment source | Admitting: Family Medicine

## 2018-12-22 VITALS — Ht 69.0 in

## 2018-12-22 DIAGNOSIS — E785 Hyperlipidemia, unspecified: Secondary | ICD-10-CM

## 2018-12-22 DIAGNOSIS — I69351 Hemiplegia and hemiparesis following cerebral infarction affecting right dominant side: Secondary | ICD-10-CM

## 2018-12-22 DIAGNOSIS — G441 Vascular headache, not elsewhere classified: Secondary | ICD-10-CM

## 2018-12-22 DIAGNOSIS — F419 Anxiety disorder, unspecified: Secondary | ICD-10-CM | POA: Diagnosis not present

## 2018-12-22 DIAGNOSIS — R202 Paresthesia of skin: Secondary | ICD-10-CM

## 2018-12-22 DIAGNOSIS — F5101 Primary insomnia: Secondary | ICD-10-CM

## 2018-12-22 DIAGNOSIS — R748 Abnormal levels of other serum enzymes: Secondary | ICD-10-CM | POA: Diagnosis not present

## 2018-12-22 DIAGNOSIS — G959 Disease of spinal cord, unspecified: Secondary | ICD-10-CM

## 2018-12-22 DIAGNOSIS — R7309 Other abnormal glucose: Secondary | ICD-10-CM

## 2018-12-22 DIAGNOSIS — F1310 Sedative, hypnotic or anxiolytic abuse, uncomplicated: Secondary | ICD-10-CM

## 2018-12-22 DIAGNOSIS — Z79899 Other long term (current) drug therapy: Secondary | ICD-10-CM

## 2018-12-22 DIAGNOSIS — I6300 Cerebral infarction due to thrombosis of unspecified precerebral artery: Secondary | ICD-10-CM

## 2018-12-22 DIAGNOSIS — E559 Vitamin D deficiency, unspecified: Secondary | ICD-10-CM | POA: Diagnosis not present

## 2018-12-22 DIAGNOSIS — E875 Hyperkalemia: Secondary | ICD-10-CM

## 2018-12-22 DIAGNOSIS — K219 Gastro-esophageal reflux disease without esophagitis: Secondary | ICD-10-CM

## 2018-12-22 LAB — LIPID PANEL
Cholesterol: 170 mg/dL (ref 0–200)
HDL: 59.5 mg/dL (ref >39.00)
LDL Cholesterol: 88 mg/dL (ref 0–99)
NonHDL: 110.85
Total CHOL/HDL Ratio: 3
Triglycerides: 115 mg/dL (ref 0.0–149.0)
VLDL: 23 mg/dL (ref 0.0–40.0)

## 2018-12-22 LAB — HEPATIC FUNCTION PANEL
ALT: 22 U/L (ref 0–53)
AST: 19 U/L (ref 0–37)
Albumin: 4.5 g/dL (ref 3.5–5.2)
Alkaline Phosphatase: 64 U/L (ref 39–117)
Bilirubin, Direct: 0.1 mg/dL (ref 0.0–0.3)
Total Bilirubin: 0.4 mg/dL (ref 0.2–1.2)
Total Protein: 6.9 g/dL (ref 6.0–8.3)

## 2018-12-22 LAB — CK: Total CK: 232 U/L (ref 7–232)

## 2018-12-22 LAB — VITAMIN D 25 HYDROXY (VIT D DEFICIENCY, FRACTURES): VITD: 32.03 ng/mL (ref 30.00–100.00)

## 2018-12-22 MED ORDER — CLONAZEPAM 0.5 MG PO TABS: 0.5000 mg | ORAL_TABLET | Freq: Every day | ORAL | 5 refills | Status: DC

## 2018-12-22 MED ORDER — ZOLPIDEM TARTRATE ER 12.5 MG PO TBCR: 12.5000 mg | EXTENDED_RELEASE_TABLET | Freq: Every evening | ORAL | 1 refills | Status: DC | PRN

## 2018-12-22 MED ORDER — GABAPENTIN 300 MG PO CAPS: 300.0000 mg | ORAL_CAPSULE | Freq: Three times a day (TID) | ORAL | 3 refills | Status: DC

## 2018-12-22 NOTE — Addendum Note (Signed)
Addended byShawnie Pons on: 12/22/2018 10:11 AM   Modules accepted: Orders

## 2018-12-22 NOTE — Assessment & Plan Note (Addendum)
Deteriorated. >40 minutes spent in face to face time with patient, >50% spent in counselling or coordination of care discussing anxiety,depression, CVA, vitamin D deficiency, insomnia, radiculopathy, headacehes.  GAD7 and PHQ much worse- he feels this is all situational- being pain, recent stroke.  He wants to try a higher dose of gabapentin before deciding on trying therapy or another antidepressant/anxiolytic.  Advised the importance of using Klonopin sparingly- dependece, tolerance and the risk of decreased respiratory drive.  The patient indicates understanding of these issues and agrees with the plan.

## 2018-12-24 ENCOUNTER — Other Ambulatory Visit: Payer: Self-pay | Admitting: Family Medicine

## 2018-12-24 LAB — PAIN MGMT, PROFILE 8 W/CONF, U
6 Acetylmorphine: NEGATIVE ng/mL
Alcohol Metabolites: NEGATIVE ng/mL (ref <500)
Amphetamines: NEGATIVE ng/mL
Benzodiazepines: NEGATIVE ng/mL
Buprenorphine, Urine: NEGATIVE ng/mL
Cocaine Metabolite: NEGATIVE ng/mL
Codeine: NEGATIVE ng/mL
Creatinine: 173.4 mg/dL
Hydrocodone: 2666 ng/mL
Hydromorphone: 184 ng/mL
MDMA: NEGATIVE ng/mL
Marijuana Metabolite: NEGATIVE ng/mL
Morphine: NEGATIVE ng/mL
Norhydrocodone: 3135 ng/mL
Opiates: POSITIVE ng/mL
Oxidant: NEGATIVE ug/mL
Oxycodone: NEGATIVE ng/mL
pH: 5.4 (ref 4.5–9.0)

## 2018-12-24 MED ORDER — VITAMIN D (ERGOCALCIFEROL) 1.25 MG (50000 UNIT) PO CAPS: 50000.0000 [IU] | ORAL_CAPSULE | ORAL | 0 refills | Status: DC

## 2019-01-05 ENCOUNTER — Ambulatory Visit: Payer: No Typology Code available for payment source | Attending: Anesthesiology | Admitting: Physical Therapy

## 2019-01-05 ENCOUNTER — Other Ambulatory Visit: Payer: Self-pay

## 2019-01-05 ENCOUNTER — Encounter: Payer: Self-pay | Admitting: Physical Therapy

## 2019-01-05 DIAGNOSIS — R209 Unspecified disturbances of skin sensation: Secondary | ICD-10-CM | POA: Insufficient documentation

## 2019-01-05 DIAGNOSIS — M542 Cervicalgia: Secondary | ICD-10-CM | POA: Insufficient documentation

## 2019-01-05 DIAGNOSIS — R293 Abnormal posture: Secondary | ICD-10-CM | POA: Insufficient documentation

## 2019-01-05 DIAGNOSIS — R208 Other disturbances of skin sensation: Secondary | ICD-10-CM

## 2019-01-05 NOTE — Therapy (Signed)
[place], [place], [address] Phone: [phone]   Fax:  [phone]  Physical Therapy Evaluation  Patient Details  Name: [name] MRN: [id number] Date of Birth: 08-Jun-1970 Referring Provider (PT): Clydell Hakim, MD    Encounter Date: 01/05/2019  PT End of Session - 01/05/19 1017    Visit Number  1    Number of Visits  13    Date for PT Re-Evaluation  02/16/19    Authorization Type  [facility] [employer]    PT Start Time  0930    PT Stop Time  1015    PT Time Calculation (min)  45 min    Activity Tolerance  Patient tolerated treatment well    Behavior During Therapy  [facility] for tasks assessed/performed       Past Medical History:  Diagnosis Date  . Anxiety    takes Xanax daily prn, panic attacks  . Chronic gastric ulcer with bleeding s/p suture repair 2012 09/29/2010   Head 3 EGD which failed to stop the bleeding. Had exploratory laparotomy on 10/04/2010. Possible cause of the gastric ulcer was NSAID use.   . Constipation    related to pain meds  . Depression   . Dizziness    occasionally  . Dyspnea    walking a bit  . Erosive esophagitis   . External hemorrhoid, bleeding   . Gastric ulcer    history of  . GERD (gastroesophageal reflux disease)    takes Protonix bid  . Hiatal hernia   . History of blood clots 2012   in abdomen  . History of blood transfusion 2012  . History of blood transfusion   . Incisional hernia s/p open repair w mesh Aug 2013 05/26/2011  . Joint pain    knees  . Nocturia    depends on amount of fluid he drinks  . Pneumonia 2018  . Pre-diabetes   . Primary localized osteoarthritis of left hip 04/24/2014  . Serrated polyp of colon   . Sleep apnea    no cpap  mild no cpap needed  . Stroke ([place])   . Upper GI bleed   . Ventral hernia     Past Surgical History:  Procedure Laterality Date  . ANTERIOR CERVICAL DECOMPRESSION/DISCECTOMY FUSION 4 LEVELS N/A 04/08/2018   Procedure:  ACDF - C3-C4 - C4-C5 - C5-C6 - C6-C7;  Surgeon: Kary Kos, MD;  Location: [facility];  Service: Neurosurgery;  Laterality: N/A;  . COLONOSCOPY    . ESOPHAGEAL MANOMETRY N/A 10/19/2016   Procedure: ESOPHAGEAL MANOMETRY (EM);  Surgeon: Mauri Pole, MD;  [facility];  Service: Endoscopy;  Laterality: N/A;  . ESOPHAGOGASTRODUODENOSCOPY    . ESOPHAGOGASTRODUODENOSCOPY N/A 01/22/2017   Procedure: ESOPHAGOGASTRODUODENOSCOPY (EGD);  Surgeon: [name], MD;  [facility];  Service: General;  Laterality: N/A;  . INCISIONAL HERNIA REPAIR  09/04/2011   Procedure: HERNIA REPAIR INCISIONAL;  Surgeon: Madilyn Hook, DO;  Location: [facility];  Service: General;  Laterality: N/A;  debridment calcified mass  . INSERTION OF MESH  09/04/2011   retrorectus ultrapro "30x30cm"  . INSERTION OF MESH N/A 01/22/2017   Procedure: INSERTION OF MESH;  Surgeon: [name], MD;  [facility];  Service: General;  Laterality: N/A;  . NISSEN FUNDOPLICATION     Open [id number]  . POLYPECTOMY    . STOMACH SURGERY  10/05/2010   Oversewing of gastric ulcer.  Dr Brantley Stage  . TOTAL HIP ARTHROPLASTY Left 04/24/2014  Procedure: LEFT TOTAL HIP ARTHROPLASTY;  Surgeon: Marchia Bond, MD;  Location: [facility];  Service: Orthopedics;  Laterality: Left;  . UPPER GASTROINTESTINAL ENDOSCOPY      There were no vitals filed for this visit.   Subjective Assessment - 01/05/19 0936    Subjective  pt is a 48 y.o with CC of tightness and pain in the neck located more in the L and bil hand N/T that occurred after the a strok on 2019 which he had recovered from. He did under goo a 4 level ACDF 04/08/2018 which he notes continued n/T in the hands and no changes in neck pain. he reports having incidence of HA that has been going on since the stroke.    Pertinent History  hx of R sided stroke    Limitations  Lifting    How long can you sit comfortably?  20 min    How long can you stand comfortably?  20 min    How long can you walk  comfortably?  20 min    Diagnostic tests  11/09/2018 CT scan, 1. No acute abnormality of the cervical spine.2. Status post C3 to C7 ACDF without evidence of hardwarecomplication.3. Moderate left C6-7 neural foraminal stenosis due to uncovertebralhypertrophy.    Patient Stated Goals  figure out was causing N/T and back/ neck pai n    Currently in Pain?  Yes    Pain Score  5    at worst it gets 7-8/10   Pain Location  Neck    Pain Orientation  Left    Pain Descriptors / Indicators  Sore;Sharp;Tightness    Pain Type  Chronic pain    Pain Radiating Towards  N/t into bil wrist/ hands    Aggravating Factors   looking to the L/R and looking up at the ceiling    Pain Relieving Factors  heating pad, medication    Effect of Pain on Daily Activities  limited endurnace and difficulty sleeping         [facility] PT Assessment - 01/05/19 0944      Assessment   Medical Diagnosis   Postlaminectomy syndrome, not elsewhere classified     Referring Provider (PT)  Clydell Hakim, MD     Onset Date/Surgical Date  --   10/06/2018   Hand Dominance  Left    Next MD Visit  --   make one PRN   Prior Therapy  yes      Precautions   Precautions  None      Restrictions   Weight Bearing Restrictions  No      Balance Screen   Has the patient fallen in the past 6 months  No    Has the patient had a decrease in activity level because of a fear of falling?   No    Is the patient reluctant to leave their home because of a fear of falling?   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[employer]  lifting, walking, carrying, moving around    Leisure  sports, golf, softball      Cognition   Overall Cognitive Status  Within Functional Limits for tasks assessed      ROM / Strength   AROM / PROM / Strength  AROM;Strength      AROM   AROM Assessment Site  Cervical;Shoulder    Right/Left Shoulder  Right;Left    Right Shoulder Extension  65 Degrees    Right Shoulder Flexion  120 Degrees    Right Shoulder ABduction  91 Degrees    Right Shoulder Internal Rotation  --   L2   Right Shoulder External Rotation  --   C7   Left Shoulder Extension  61 Degrees   reproduced concordant pain during motion on L   Left Shoulder Flexion  128 Degrees    Left Shoulder ABduction  110 Degrees    Left Shoulder Internal Rotation  --   L2   Left Shoulder External Rotation  --   C7   Cervical Flexion  20   reproduced concordant pain at during motion   Cervical Extension  18   reproduced concordant pain at end range   Cervical - Right Side Bend  11    Cervical - Left Side Bend  11    Cervical - Right Rotation  20   concordant pain during motion on the contralateral side   Cervical - Left Rotation  28      Strength   Overall Strength Comments  all MMT based in available ROM    Strength Assessment Site  Shoulder;Hand    Right/Left Shoulder  Right;Left    Right Shoulder Flexion  4/5    Right Shoulder Extension  4+/5    Right Shoulder ABduction  4/5    Right Shoulder Internal Rotation  4+/5    Right Shoulder External Rotation  4+/5    Left Shoulder Flexion  4/5    Left Shoulder Extension  4+/5   reproduced pain during testing   Left Shoulder ABduction  4/5    Left Shoulder Internal Rotation  4+/5    Left Shoulder External Rotation  4+/5    Right Hand Grip (lbs)  47.6    Left Hand Grip (lbs)  66.6   59,70,71     Palpation   Palpation comment  TTP along the L upper trap/ and levator scapulae, rhomboids                Objective measurements  completed on examination: See above findings.              PT Education - 01/05/19 1058    Education Details  evaluation findings, POC, goals, HEP with proper form/ rationale, anatomy of areas involved.    Person(s) Educated  Patient    Methods  Explanation;Verbal cues;Handout    Comprehension  Verbalized understanding;Verbal cues required       PT Short Term Goals - 01/05/19 1059      PT SHORT TERM GOAL #1   Title  pt to be I with inital HEP    Time  3    Period  Weeks    Status  New    Target Date  01/26/19      PT SHORT TERM GOAL #2   Title  pt to verbalize/ demo good posture and lifting mechanics to reduce  and prevent neck/ shoulder pain and UE N/T    Time  3    Period  Weeks    Status  New    Target Date  01/26/19        PT Long Term Goals - 01/05/19 1059      PT LONG TERM GOAL #1   Title  pt to increase gross cervical ROM by >/= 8 degrees in all planes to maximize overal function with no report of pain    Time  6    Period  Weeks    Status  New    Target Date  02/16/19      PT LONG TERM GOAL #2   Title  pt to report N/T in bil hands to intermittent to promote improvement in condition and QOL    Time  8    Period  Weeks    Status  New    Target Date  03/02/19      PT LONG TERM GOAL #3   Title  increase gross shoulder strength to >/= 4+/5 with report of L shoulder pain to promote lifting and carrying required for work related activities.    Time  6    Period  Weeks    Status  New    Target Date  02/16/19      PT LONG TERM GOAL #4   Title  pt to be I with all HEP given as of last visit to maintain and progress current level of function    Time  6    Period  Weeks    Status  New    Target Date  02/16/19             Plan - 01/05/19 1018    Clinical Impression Statement  pt presents to OPPT with CC of  bil neck and posterior shoulder pain L>R and N/T in bil median nerve distribution in the hands. he demonstrates limited cervical ROM  secondary to hx or multi-level ACDF back in 04/08/2018, and limited shoulder ROM. TTP noted along bil sub-occipiotals, Rhomboids, levator scapuale and upper trap L>R. pt would benefit from physical therapy to reduce muslce tension and pain, promote cervicaothroacic mobility, reduce N/T and maximize overall function by addressing the deficits listed.    Personal Factors and Comorbidities  Comorbidity 3+    Comorbidities  hx or stroke, ACDF, depression    Examination-Activity Limitations  Lift;Stand;Reach Overhead;Carry    Stability/Clinical Decision Making  Unstable/Unpredictable    Clinical Decision Making  High    Rehab Potential  Good    PT Frequency  2x / week    PT Duration  6 weeks    PT Treatment/Interventions  ADLs/Self Care Home Management;Cryotherapy;Electrical Stimulation;Iontophoresis 4mg /ml Dexamethasone;Moist Heat;Traction;Ultrasound;Therapeutic activities;Therapeutic exercise;Neuromuscular re-education;Patient/family education;Manual techniques;Passive range of motion;Dry needling;Taping    PT Next Visit Plan  review/ update HEP, median nerve glides, STW along posterior shoulder musculature, scapular stability/ cervical isometrics, modalities PRN, posture education.    PT Home Exercise Plan  Access code: [email or web address] chin tuck (in supine), upper trap stretch, levator scapule stretching, median nerve glide    Consulted and Agree with Plan of Care  Patient       Patient will benefit from skilled therapeutic intervention in order to improve the following deficits and impairments:  Improper body mechanics, Increased muscle spasms, Decreased strength, Pain, Decreased endurance, Decreased mobility, Decreased activity tolerance, Decreased range of motion, Postural dysfunction, Impaired sensation  Visit Diagnosis: Cervicalgia  Other disturbances of skin sensation  Abnormal posture     Problem List Patient Active Problem List   Diagnosis Date Noted  . HLD (hyperlipidemia) 12/21/2018  .  Long-term current use of benzodiazepine 12/21/2018  . Ambien use disorder, mild ([place]) 12/21/2018  . Hemiplegia and hemiparesis following cerebral infarction affecting right dominant side ([place]) 12/21/2018  . Myelopathy ([place]) 04/08/2018  . Closed low lateral malleolus fracture 02/01/2018  . Paresthesia of upper and lower extremities of both sides 01/03/2018  . Elevated CK 01/03/2018  . Headache 11/11/2017  . Right sided weakness   . Vertigo   . Stroke ([place]) 10/05/2017  . Leukocytosis 10/05/2017  . Vitamin D deficiency 08/25/2017  . Hyperkalemia 08/25/2017  . Fatigue 11/23/2016  . Gastroesophageal reflux disease   . Recurrent hiatal hernia s/p redo robotic PEH/Nissen repair 01/22/2017   . Elevated blood pressure reading 12/12/2015  . Difficulty concentrating 06/20/2015  . Insomnia 05/30/2014  . Primary localized osteoarthritis of left hip 04/24/2014  . Elevated hemoglobin A1c 11/17/2012  . Erectile dysfunction 08/08/2012  . History of bleeding gastric ulcer status post repair 2012 (NSAIDs) 05/26/2011  . Anxiety 05/26/2011  . DJD (degenerative joint disease) 10/14/2010    Starr Lake PT, DPT, LAT, ATC  01/05/19  12:46 PM      [facility] [address] [place], [place], [address] Phone: [phone]   Fax:  [phone]  Name: [name] MRN: [id number] Date of Birth: [birth year]

## 2019-01-09 ENCOUNTER — Encounter: Payer: Self-pay | Admitting: Physical Therapy

## 2019-01-09 ENCOUNTER — Ambulatory Visit: Payer: No Typology Code available for payment source | Admitting: Physical Therapy

## 2019-01-09 ENCOUNTER — Other Ambulatory Visit: Payer: Self-pay

## 2019-01-09 DIAGNOSIS — M542 Cervicalgia: Secondary | ICD-10-CM

## 2019-01-09 DIAGNOSIS — R293 Abnormal posture: Secondary | ICD-10-CM

## 2019-01-09 DIAGNOSIS — R208 Other disturbances of skin sensation: Secondary | ICD-10-CM

## 2019-01-09 NOTE — Therapy (Signed)
[place], [place], [address] Phone: [phone]   Fax:  [phone]  Physical Therapy Treatment  Patient Details  Name: [name] MRN: [id number] Date of Birth: April 30, 1970 Referring Provider (PT): Clydell Hakim, MD    Encounter Date: 01/09/2019  PT End of Session - 01/09/19 1145    Visit Number  2    Number of Visits  13    Date for PT Re-Evaluation  02/16/19    Authorization Type  [facility]    PT Start Time  [id number]    PT Stop Time  1210    PT Time Calculation (min)  39 min    Activity Tolerance  Patient tolerated treatment well    Behavior During Therapy  [facility] for tasks assessed/performed       Past Medical History:  Diagnosis Date  . Anxiety    takes Xanax daily prn, panic attacks  . Chronic gastric ulcer with bleeding s/p suture repair 2012 09/29/2010   Head 3 EGD which failed to stop the bleeding. Had exploratory laparotomy on 10/04/2010. Possible cause of the gastric ulcer was NSAID use.   . Constipation    related to pain meds  . Depression   . Dizziness    occasionally  . Dyspnea    walking a bit  . Erosive esophagitis   . External hemorrhoid, bleeding   . Gastric ulcer    history of  . GERD (gastroesophageal reflux disease)    takes Protonix bid  . Hiatal hernia   . History of blood clots 2012   in abdomen  . History of blood transfusion 2012  . History of blood transfusion   . Incisional hernia s/p open repair w mesh Aug 2013 05/26/2011  . Joint pain    knees  . Nocturia    depends on amount of fluid he drinks  . Pneumonia 2018  . Pre-diabetes   . Primary localized osteoarthritis of left hip 04/24/2014  . Serrated polyp of colon   . Sleep apnea    no cpap  mild no cpap needed  . Stroke ([place])   . Upper GI bleed   . Ventral hernia     Past Surgical History:  Procedure Laterality Date  . ANTERIOR CERVICAL DECOMPRESSION/DISCECTOMY FUSION 4 LEVELS N/A 04/08/2018   Procedure:  ACDF - C3-C4 - C4-C5 - C5-C6 - C6-C7;  Surgeon: Kary Kos, MD;  Location: [facility];  Service: Neurosurgery;  Laterality: N/A;  . COLONOSCOPY    . ESOPHAGEAL MANOMETRY N/A 10/19/2016   Procedure: ESOPHAGEAL MANOMETRY (EM);  Surgeon: Mauri Pole, MD;  [facility];  Service: Endoscopy;  Laterality: N/A;  . ESOPHAGOGASTRODUODENOSCOPY    . ESOPHAGOGASTRODUODENOSCOPY N/A 01/22/2017   Procedure: ESOPHAGOGASTRODUODENOSCOPY (EGD);  Surgeon: [name], MD;  [facility];  Service: General;  Laterality: N/A;  . INCISIONAL HERNIA REPAIR  09/04/2011   Procedure: HERNIA REPAIR INCISIONAL;  Surgeon: Madilyn Hook, DO;  Location: [facility];  Service: General;  Laterality: N/A;  debridment calcified mass  . INSERTION OF MESH  09/04/2011   retrorectus ultrapro "30x30cm"  . INSERTION OF MESH N/A 01/22/2017   Procedure: INSERTION OF MESH;  Surgeon: [name], MD;  [facility];  Service: General;  Laterality: N/A;  . NISSEN FUNDOPLICATION     Open [id number]  . POLYPECTOMY    . STOMACH SURGERY  10/05/2010   Oversewing of gastric ulcer.  Dr Brantley Stage  . TOTAL HIP ARTHROPLASTY Left 04/24/2014  Procedure: LEFT TOTAL HIP ARTHROPLASTY;  Surgeon: Marchia Bond, MD;  Location: [facility];  Service: Orthopedics;  Laterality: Left;  . UPPER GASTROINTESTINAL ENDOSCOPY      There were no vitals filed for this visit.  Subjective Assessment - 01/09/19 1134    Subjective  Patient reports he continues to have bilat hand/arm numbness and L shoulder pain.    Currently in Pain?  Yes    Pain Score  4     Pain Location  Neck    Pain Orientation  Left;Right    Pain Descriptors / Indicators  Aching;Tightness    Pain Type  Chronic pain    Pain Radiating Towards  N/T in bilat arms/hands    Pain Onset  More than a month ago    Pain Frequency  Constant                       [facility] Adult PT Treatment/Exercise - 01/09/19 0001      Exercises   Exercises  Neck      Neck Exercises: Machines for  Strengthening   UBE (Upper Arm Bike)  L1 x 4 min (2 fwd/2 bwd)      Neck Exercises: Theraband   Rows  10 reps;Red   patient reported increased left scapular pain so d/c'd     Neck Exercises: Standing   Other Standing Exercises  Self massage using tennis ball to left sided thoracic and periscapular region      Neck Exercises: Seated   Neck Retraction  10 reps   protraction + retraction     Neck Exercises: Supine   Neck Retraction  10 reps      Neck Exercises: Prone   Shoulder Extension  10 reps   2 sets     Manual Therapy   Manual Therapy  Soft tissue mobilization    Soft tissue mobilization  Left rhomboid, thoracic paraspinals, periscapular region             PT Education - 01/09/19 1231    Education Details  HEP    Person(s) Educated  Patient    Methods  Explanation;Demonstration;Tactile cues;Verbal cues;Handout    Comprehension  Verbalized understanding;Returned demonstration;Verbal cues required;Tactile cues required;Need further instruction       PT Short Term Goals - 01/05/19 1059      PT SHORT TERM GOAL #1   Title  pt to be I with inital HEP    Time  3    Period  Weeks    Status  New    Target Date  01/26/19      PT SHORT TERM GOAL #2   Title  pt to verbalize/ demo good posture and lifting mechanics to reduce and prevent neck/ shoulder pain and UE N/T    Time  3    Period  Weeks    Status  New    Target Date  01/26/19        PT Long Term Goals - 01/05/19 1059      PT LONG TERM GOAL #1   Title  pt to increase gross cervical ROM by >/= 8 degrees in all planes to maximize overal function with no report of pain    Time  6    Period  Weeks    Status  New    Target Date  02/16/19      PT LONG TERM GOAL #2   Title  pt to report N/T in bil hands to intermittent  to promote improvement in condition and QOL    Time  8    Period  Weeks    Status  New    Target Date  03/02/19      PT LONG TERM GOAL #3   Title  increase gross shoulder strength to  >/= 4+/5 with report of L shoulder pain to promote lifting and carrying required for work related activities.    Time  6    Period  Weeks    Status  New    Target Date  02/16/19      PT LONG TERM GOAL #4   Title  pt to be I with all HEP given as of last visit to maintain and progress current level of function    Time  6    Period  Weeks    Status  New    Target Date  02/16/19            Plan - 01/09/19 1256    Clinical Impression Statement  Patient reported increased neck pain with stretching and nerve flossing exercises so they were d/c'd for now. He tolerated progression of chin tuck and postural strengthening exercise. He also did well with manual therapy and self massage using ball with slight improvement in left shoulder/scapular pain. He would benefit from continued skilled PT to reduce numbness in hands and improve pain with activity.    PT Treatment/Interventions  ADLs/Self Care Home Management;Cryotherapy;Electrical Stimulation;Iontophoresis 4mg /ml Dexamethasone;Moist Heat;Traction;Ultrasound;Therapeutic activities;Therapeutic exercise;Neuromuscular re-education;Patient/family education;Manual techniques;Passive range of motion;Dry needling;Taping    PT Next Visit Plan  review/ update HEP, STW along posterior shoulder musculature, scapular stability/ cervical isometrics, modalities PRN, posture education.    PT Home Exercise Plan  Seated chin tuck (with protraction), prone I, self massage with ball against wall    Consulted and Agree with Plan of Care  Patient       Patient will benefit from skilled therapeutic intervention in order to improve the following deficits and impairments:  Improper body mechanics, Increased muscle spasms, Decreased strength, Pain, Decreased endurance, Decreased mobility, Decreased activity tolerance, Decreased range of motion, Postural dysfunction, Impaired sensation  Visit Diagnosis: Cervicalgia  Other disturbances of skin sensation  Abnormal  posture     Problem List Patient Active Problem List   Diagnosis Date Noted  . HLD (hyperlipidemia) 12/21/2018  . Long-term current use of benzodiazepine 12/21/2018  . Ambien use disorder, mild ([place]) 12/21/2018  . Hemiplegia and hemiparesis following cerebral infarction affecting right dominant side ([place]) 12/21/2018  . Myelopathy ([place]) 04/08/2018  . Closed low lateral malleolus fracture 02/01/2018  . Paresthesia of upper and lower extremities of both sides 01/03/2018  . Elevated CK 01/03/2018  . Headache 11/11/2017  . Right sided weakness   . Vertigo   . Stroke ([place]) 10/05/2017  . Leukocytosis 10/05/2017  . Vitamin D deficiency 08/25/2017  . Hyperkalemia 08/25/2017  . Fatigue 11/23/2016  . Gastroesophageal reflux disease   . Recurrent hiatal hernia s/p redo robotic PEH/Nissen repair 01/22/2017   . Elevated blood pressure reading 12/12/2015  . Difficulty concentrating 06/20/2015  . Insomnia 05/30/2014  . Primary localized osteoarthritis of left hip 04/24/2014  . Elevated hemoglobin A1c 11/17/2012  . Erectile dysfunction 08/08/2012  . History of bleeding gastric ulcer status post repair 2012 (NSAIDs) 05/26/2011  . Anxiety 05/26/2011  . DJD (degenerative joint disease) 10/14/2010    Hilda Blades, PT, DPT, LAT, ATC 01/09/19  1:01 PM Phone: [phone] Fax: [facility] [address]  [place], [place], [address] Phone: [phone]   Fax:  [phone]  Name: [name] MRN: [id number] Date of Birth: Mar 07, 1970

## 2019-01-09 NOTE — Patient Instructions (Signed)
Access Code: [email or web address]  URL: [email or web address]  Date: 01/09/2019  Prepared by: Hilda Blades   Exercises Seated Cervical Retraction Protraction AROM - 10 reps - 2 seconds hold - 1-2x daily Prone Scapular Slide with Shoulder Extension - 10 reps - 2 sets - 1-2x daily

## 2019-01-12 ENCOUNTER — Other Ambulatory Visit: Payer: Self-pay

## 2019-01-12 ENCOUNTER — Encounter: Payer: Self-pay | Admitting: Physical Therapy

## 2019-01-12 ENCOUNTER — Ambulatory Visit: Payer: No Typology Code available for payment source | Admitting: Physical Therapy

## 2019-01-12 DIAGNOSIS — M542 Cervicalgia: Secondary | ICD-10-CM | POA: Diagnosis not present

## 2019-01-12 DIAGNOSIS — R293 Abnormal posture: Secondary | ICD-10-CM

## 2019-01-12 DIAGNOSIS — R208 Other disturbances of skin sensation: Secondary | ICD-10-CM

## 2019-01-12 NOTE — Therapy (Signed)
[place], [place], [address] Phone: [phone]   Fax:  [phone]  Physical Therapy Treatment  Patient Details  Name: [name] MRN: [id number] Date of Birth: 1970/10/30 Referring Provider (PT): Clydell Hakim, MD    Encounter Date: 01/12/2019  PT End of Session - 01/12/19 1243    Visit Number  3    Number of Visits  13    Date for PT Re-Evaluation  02/16/19    Authorization Type  [facility]    PT Start Time  1137    PT Stop Time  1215    PT Time Calculation (min)  38 min    Activity Tolerance  Patient tolerated treatment well    Behavior During Therapy  [facility] for tasks assessed/performed       Past Medical History:  Diagnosis Date  . Anxiety    takes Xanax daily prn, panic attacks  . Chronic gastric ulcer with bleeding s/p suture repair 2012 09/29/2010   Head 3 EGD which failed to stop the bleeding. Had exploratory laparotomy on 10/04/2010. Possible cause of the gastric ulcer was NSAID use.   . Constipation    related to pain meds  . Depression   . Dizziness    occasionally  . Dyspnea    walking a bit  . Erosive esophagitis   . External hemorrhoid, bleeding   . Gastric ulcer    history of  . GERD (gastroesophageal reflux disease)    takes Protonix bid  . Hiatal hernia   . History of blood clots 2012   in abdomen  . History of blood transfusion 2012  . History of blood transfusion   . Incisional hernia s/p open repair w mesh Aug 2013 05/26/2011  . Joint pain    knees  . Nocturia    depends on amount of fluid he drinks  . Pneumonia 2018  . Pre-diabetes   . Primary localized osteoarthritis of left hip 04/24/2014  . Serrated polyp of colon   . Sleep apnea    no cpap  mild no cpap needed  . Stroke ([place])   . Upper GI bleed   . Ventral hernia     Past Surgical History:  Procedure Laterality Date  . ANTERIOR CERVICAL DECOMPRESSION/DISCECTOMY FUSION 4 LEVELS N/A 04/08/2018   Procedure:  ACDF - C3-C4 - C4-C5 - C5-C6 - C6-C7;  Surgeon: Kary Kos, MD;  Location: [place];  Service: Neurosurgery;  Laterality: N/A;  . COLONOSCOPY    . ESOPHAGEAL MANOMETRY N/A 10/19/2016   Procedure: ESOPHAGEAL MANOMETRY (EM);  Surgeon: Mauri Pole, MD;  [facility];  Service: Endoscopy;  Laterality: N/A;  . ESOPHAGOGASTRODUODENOSCOPY    . ESOPHAGOGASTRODUODENOSCOPY N/A 01/22/2017   Procedure: ESOPHAGOGASTRODUODENOSCOPY (EGD);  Surgeon: [name], MD;  [facility];  Service: General;  Laterality: N/A;  . INCISIONAL HERNIA REPAIR  09/04/2011   Procedure: HERNIA REPAIR INCISIONAL;  Surgeon: Madilyn Hook, DO;  Location: [place];  Service: General;  Laterality: N/A;  debridment calcified mass  . INSERTION OF MESH  09/04/2011   retrorectus ultrapro "30x30cm"  . INSERTION OF MESH N/A 01/22/2017   Procedure: INSERTION OF MESH;  Surgeon: [name], MD;  [facility];  Service: General;  Laterality: N/A;  . NISSEN FUNDOPLICATION     Open [id number]  . POLYPECTOMY    . STOMACH SURGERY  10/05/2010   Oversewing of gastric ulcer.  Dr Brantley Stage  . TOTAL HIP ARTHROPLASTY Left 04/24/2014  Procedure: LEFT TOTAL HIP ARTHROPLASTY;  Surgeon: Marchia Bond, MD;  Location: [place];  Service: Orthopedics;  Laterality: Left;  . UPPER GASTROINTESTINAL ENDOSCOPY      There were no vitals filed for this visit.  Subjective Assessment - 01/12/19 1240    Subjective  Patient reports he continues to have bilat hand/arm numbness that has not changed. He does report his neck is feeling better. He notes that the back of his shoulders and upper back will have a burning sensation occasionaly during the day.    Currently in Pain?  Yes    Pain Score  2     Pain Location  Neck    Pain Orientation  Right;Left    Pain Descriptors / Indicators  Aching;Tightness;Burning    Pain Type  Chronic pain    Pain Radiating Towards  Numbness in bilat arms/hands    Pain Onset  More than a month ago    Pain Frequency   Constant         [facility] PT Assessment - 01/12/19 0001      Strength   Overall Strength Comments  Periscapular strength grossly 4/5 bilaterally    Right Shoulder Extension  4+/5    Right Shoulder Horizontal ABduction  4/5    Left Shoulder Extension  4+/5    Left Shoulder Horizontal ABduction  4/5                   [facility] Adult PT Treatment/Exercise - 01/12/19 0001      Self-Care   Self-Care  Posture    Posture  Patient was instructed on proper posture, shoulder rolls if he feels posterior shoulder/upper back and night tightness or burning      Exercises   Exercises  Neck      Neck Exercises: Machines for Strengthening   UBE (Upper Arm Bike)  L1 x 4 min (2 fwd/2 bwd)      Neck Exercises: Theraband   Shoulder External Rotation  10 reps    Shoulder External Rotation Limitations  Bilateral with yellow band      Neck Exercises: Seated   Neck Retraction  10 reps   protraction + retraction   Shoulder Rolls  10 reps;Backwards      Neck Exercises: Prone   Shoulder Extension  10 reps    Shoulder Extension Limitations  focus on scapular retraction/depression      Manual Therapy   Manual Therapy  Soft tissue mobilization;Joint mobilization    Joint Mobilization  Thoracic PA grade III-IV     Soft tissue mobilization  Bilateral upper trap, left rhomboid and posterior cuff      Neck Exercises: Stretches   Upper Trapezius Stretch  2 reps;30 seconds    Other Neck Stretches  Side lying thoracic rotation open book stretch x10 each             PT Education - 01/12/19 1242    Education Details  HEP, posture    Person(s) Educated  Patient    Methods  Explanation;Demonstration;Verbal cues;Tactile cues;Handout    Comprehension  Verbalized understanding;Returned demonstration;Verbal cues required;Tactile cues required;Need further instruction       PT Short Term Goals - 01/05/19 1059      PT SHORT TERM GOAL #1   Title  pt to be I with inital HEP    Time  3     Period  Weeks    Status  New    Target Date  01/26/19  PT SHORT TERM GOAL #2   Title  pt to verbalize/ demo good posture and lifting mechanics to reduce and prevent neck/ shoulder pain and UE N/T    Time  3    Period  Weeks    Status  New    Target Date  01/26/19        PT Long Term Goals - 01/05/19 1059      PT LONG TERM GOAL #1   Title  pt to increase gross cervical ROM by >/= 8 degrees in all planes to maximize overal function with no report of pain    Time  6    Period  Weeks    Status  New    Target Date  02/16/19      PT LONG TERM GOAL #2   Title  pt to report N/T in bil hands to intermittent to promote improvement in condition and QOL    Time  8    Period  Weeks    Status  New    Target Date  03/02/19      PT LONG TERM GOAL #3   Title  increase gross shoulder strength to >/= 4+/5 with report of L shoulder pain to promote lifting and carrying required for work related activities.    Time  6    Period  Weeks    Status  New    Target Date  02/16/19      PT LONG TERM GOAL #4   Title  pt to be I with all HEP given as of last visit to maintain and progress current level of function    Time  6    Period  Weeks    Status  New    Target Date  02/16/19            Plan - 01/12/19 1245    Clinical Impression Statement  Patient is doing well in regard to his neck discomfort and tightness, but he continues to have bilateral numbness in both arms and hands, and reported that he felt numbness in his chest and back as well. He exhibits increased muscular tension/trigger points and thoracic stiffness, and he requires constant cueing for posture as he rests in a rounded and shrugged shoulder position. He would benefit from continued skilled PT to improve his upper back and neck tightness/discomfort as well as his posture through strengthening.    PT Treatment/Interventions  ADLs/Self Care Home Management;Cryotherapy;Electrical Stimulation;Iontophoresis 4mg /ml  Dexamethasone;Moist Heat;Traction;Ultrasound;Therapeutic activities;Therapeutic exercise;Neuromuscular re-education;Patient/family education;Manual techniques;Passive range of motion;Dry needling;Taping    PT Next Visit Plan  review/ update HEP, STW along posterior shoulder musculature and upper trap, scapular stability/cervical isometrics, modalities PRN, posture education.    PT Home Exercise Plan  Seated chin tuck (with protraction), prone I, double ER with yellow, self massage with ball against wall    Consulted and Agree with Plan of Care  Patient       Patient will benefit from skilled therapeutic intervention in order to improve the following deficits and impairments:  Improper body mechanics, Increased muscle spasms, Decreased strength, Pain, Decreased endurance, Decreased mobility, Decreased activity tolerance, Decreased range of motion, Postural dysfunction, Impaired sensation  Visit Diagnosis: Cervicalgia  Abnormal posture  Other disturbances of skin sensation     Problem List Patient Active Problem List   Diagnosis Date Noted  . HLD (hyperlipidemia) 12/21/2018  . Long-term current use of benzodiazepine 12/21/2018  . Ambien use disorder, mild ([place]) 12/21/2018  . Hemiplegia and hemiparesis following  cerebral infarction affecting right dominant side ([place]) 12/21/2018  . Myelopathy ([place]) 04/08/2018  . Closed low lateral malleolus fracture 02/01/2018  . Paresthesia of upper and lower extremities of both sides 01/03/2018  . Elevated CK 01/03/2018  . Headache 11/11/2017  . Right sided weakness   . Vertigo   . Stroke ([place]) 10/05/2017  . Leukocytosis 10/05/2017  . Vitamin D deficiency 08/25/2017  . Hyperkalemia 08/25/2017  . Fatigue 11/23/2016  . Gastroesophageal reflux disease   . Recurrent hiatal hernia s/p redo robotic PEH/Nissen repair 01/22/2017   . Elevated blood pressure reading 12/12/2015  . Difficulty concentrating 06/20/2015  . Insomnia 05/30/2014  . Primary  localized osteoarthritis of left hip 04/24/2014  . Elevated hemoglobin A1c 11/17/2012  . Erectile dysfunction 08/08/2012  . History of bleeding gastric ulcer status post repair 2012 (NSAIDs) 05/26/2011  . Anxiety 05/26/2011  . DJD (degenerative joint disease) 10/14/2010    Hilda Blades, PT, DPT, LAT, ATC 01/12/19  12:54 PM Phone: [phone] Fax: [facility] [address] [place], [place], [address] Phone: [phone]   Fax:  [phone]  Name: [name] MRN: [id number] Date of Birth: 1970-05-14

## 2019-01-16 ENCOUNTER — Ambulatory Visit: Payer: No Typology Code available for payment source | Admitting: Physical Therapy

## 2019-01-16 ENCOUNTER — Encounter: Payer: Self-pay | Admitting: Physical Therapy

## 2019-01-16 ENCOUNTER — Other Ambulatory Visit: Payer: Self-pay

## 2019-01-16 DIAGNOSIS — M542 Cervicalgia: Secondary | ICD-10-CM | POA: Diagnosis not present

## 2019-01-16 DIAGNOSIS — R208 Other disturbances of skin sensation: Secondary | ICD-10-CM

## 2019-01-16 DIAGNOSIS — R293 Abnormal posture: Secondary | ICD-10-CM

## 2019-01-16 NOTE — Therapy (Signed)
[place], [place], [address] Phone: [phone]   Fax:  [phone]  Physical Therapy Treatment  Patient Details  Name: [name] MRN: [id number] Date of Birth: [birth year] Referring Provider (PT): Clydell Hakim, MD    Encounter Date: 01/16/2019  PT End of Session - 01/16/19 1150    Visit Number  4    Number of Visits  13    Date for PT Re-Evaluation  02/16/19    Authorization Type  [facility]    PT Start Time  [id number]    PT Stop Time  1215    PT Time Calculation (min)  44 min    Activity Tolerance  Patient tolerated treatment well    Behavior During Therapy  [facility] for tasks assessed/performed       Past Medical History:  Diagnosis Date  . Anxiety    takes Xanax daily prn, panic attacks  . Chronic gastric ulcer with bleeding s/p suture repair 2012 09/29/2010   Head 3 EGD which failed to stop the bleeding. Had exploratory laparotomy on 10/04/2010. Possible cause of the gastric ulcer was NSAID use.   . Constipation    related to pain meds  . Depression   . Dizziness    occasionally  . Dyspnea    walking a bit  . Erosive esophagitis   . External hemorrhoid, bleeding   . Gastric ulcer    history of  . GERD (gastroesophageal reflux disease)    takes Protonix bid  . Hiatal hernia   . History of blood clots 2012   in abdomen  . History of blood transfusion 2012  . History of blood transfusion   . Incisional hernia s/p open repair w mesh Aug 2013 05/26/2011  . Joint pain    knees  . Nocturia    depends on amount of fluid he drinks  . Pneumonia 2018  . Pre-diabetes   . Primary localized osteoarthritis of left hip 04/24/2014  . Serrated polyp of colon   . Sleep apnea    no cpap  mild no cpap needed  . Stroke ([place])   . Upper GI bleed   . Ventral hernia     Past Surgical History:  Procedure Laterality Date  . ANTERIOR CERVICAL DECOMPRESSION/DISCECTOMY FUSION 4 LEVELS N/A 04/08/2018   Procedure:  ACDF - C3-C4 - C4-C5 - C5-C6 - C6-C7;  Surgeon: Kary Kos, MD;  Location: [place];  Service: Neurosurgery;  Laterality: N/A;  . COLONOSCOPY    . ESOPHAGEAL MANOMETRY N/A 10/19/2016   Procedure: ESOPHAGEAL MANOMETRY (EM);  Surgeon: Mauri Pole, MD;  [facility];  Service: Endoscopy;  Laterality: N/A;  . ESOPHAGOGASTRODUODENOSCOPY    . ESOPHAGOGASTRODUODENOSCOPY N/A 01/22/2017   Procedure: ESOPHAGOGASTRODUODENOSCOPY (EGD);  Surgeon: [name], MD;  [facility];  Service: General;  Laterality: N/A;  . INCISIONAL HERNIA REPAIR  09/04/2011   Procedure: HERNIA REPAIR INCISIONAL;  Surgeon: Madilyn Hook, DO;  Location: [place];  Service: General;  Laterality: N/A;  debridment calcified mass  . INSERTION OF MESH  09/04/2011   retrorectus ultrapro "30x30cm"  . INSERTION OF MESH N/A 01/22/2017   Procedure: INSERTION OF MESH;  Surgeon: [name], MD;  [facility];  Service: General;  Laterality: N/A;  . NISSEN FUNDOPLICATION     Open [id number]  . POLYPECTOMY    . STOMACH SURGERY  10/05/2010   Oversewing of gastric ulcer.  Dr Brantley Stage  . TOTAL HIP ARTHROPLASTY Left 04/24/2014  Procedure: LEFT TOTAL HIP ARTHROPLASTY;  Surgeon: Marchia Bond, MD;  Location: [place];  Service: Orthopedics;  Laterality: Left;  . UPPER GASTROINTESTINAL ENDOSCOPY      There were no vitals filed for this visit.  Subjective Assessment - 01/16/19 1134    Subjective  Patient reports this past Saturday he jammed his neck by hitting the top of his head while working on ceiling tiles. Currently he is has increased left sided neck pain.    Currently in Pain?  Yes    Pain Score  6     Pain Location  Neck    Pain Orientation  Left;Posterior    Pain Descriptors / Indicators  Aching    Pain Type  Chronic pain    Pain Radiating Towards  Numbness in bilat arms/hands    Pain Onset  More than a month ago    Pain Frequency  Constant                       [facility] Adult PT Treatment/Exercise -  01/16/19 0001      Neck Exercises: Theraband   Shoulder External Rotation  10 reps    Shoulder External Rotation Limitations  Bilateral with yellow band      Neck Exercises: Seated   Neck Retraction  10 reps   protraction + retraction   Shoulder Rolls  10 reps;Backwards      Manual Therapy   Manual Therapy  Soft tissue mobilization;Myofascial release;Passive ROM;Joint mobilization    Joint Mobilization  Thoracic CPA grade III-IV throughout upper thoracic region     Soft tissue mobilization  Left upper trap, left rhomboid and posterior cuff    Myofascial Release  Left upper trap, left rhomboid    Passive ROM  Left upper trap stretching      Neck Exercises: Stretches   Upper Trapezius Stretch  2 reps;30 seconds    Levator Stretch  2 reps;30 seconds    Other Neck Stretches  Side lying thoracic rotation open book stretch x10 each       Trigger Point Dry Needling - 01/16/19 0001    Consent Given?  Yes    Muscles Treated Head and Neck  Upper trapezius    Muscles Treated Upper Quadrant  Rhomboids    Upper Trapezius Response  Twitch reponse elicited    Rhomboids Response  Twitch response elicited      Dry needling performed the Carlus Pavlov DPT, PT, certified in trigger point dry needling.     PT Education - 01/16/19 1137    Education Details  HEP    Person(s) Educated  Patient    Methods  Explanation;Demonstration;Verbal cues    Comprehension  Verbalized understanding;Returned demonstration;Verbal cues required;Need further instruction       PT Short Term Goals - 01/05/19 1059      PT SHORT TERM GOAL #1   Title  pt to be I with inital HEP    Time  3    Period  Weeks    Status  New    Target Date  01/26/19      PT SHORT TERM GOAL #2   Title  pt to verbalize/ demo good posture and lifting mechanics to reduce and prevent neck/ shoulder pain and UE N/T    Time  3    Period  Weeks    Status  New    Target Date  01/26/19        PT Long Term Goals -  01/05/19 1059       PT LONG TERM GOAL #1   Title  pt to increase gross cervical ROM by >/= 8 degrees in all planes to maximize overal function with no report of pain    Time  6    Period  Weeks    Status  New    Target Date  02/16/19      PT LONG TERM GOAL #2   Title  pt to report N/T in bil hands to intermittent to promote improvement in condition and QOL    Time  8    Period  Weeks    Status  New    Target Date  03/02/19      PT LONG TERM GOAL #3   Title  increase gross shoulder strength to >/= 4+/5 with report of L shoulder pain to promote lifting and carrying required for work related activities.    Time  6    Period  Weeks    Status  New    Target Date  02/16/19      PT LONG TERM GOAL #4   Title  pt to be I with all HEP given as of last visit to maintain and progress current level of function    Time  6    Period  Weeks    Status  New    Target Date  02/16/19            Plan - 01/16/19 1203    Clinical Impression Statement  Patient exhibits increased muscular tension/tightness this visit following hittng his head on Saturday so the majority of therapy focused on manual therapy to improve her symptoms and reduce pain. He did report benefit from the manual therapy and dry needling in regard to neck/upper back tightness, but continues to have no change with hand numbness. He continues to require cuing for posture. He would benefit from continued skilled PT to reduce neck pain and return to PLOF.    PT Treatment/Interventions  ADLs/Self Care Home Management;Cryotherapy;Electrical Stimulation;Iontophoresis 4mg /ml Dexamethasone;Moist Heat;Traction;Ultrasound;Therapeutic activities;Therapeutic exercise;Neuromuscular re-education;Patient/family education;Manual techniques;Passive range of motion;Dry needling;Taping    PT Next Visit Plan  review/ update HEP, STW along posterior shoulder musculature and upper trap, scapular stability/cervical isometrics, modalities PRN, posture education.    PT Home  Exercise Plan  Seated chin tuck (with protraction), prone I, double ER with yellow, sidelying thoracic rotation open book stretch, self massage with ball against wall    Consulted and Agree with Plan of Care  Patient       Patient will benefit from skilled therapeutic intervention in order to improve the following deficits and impairments:  Improper body mechanics, Increased muscle spasms, Decreased strength, Pain, Decreased endurance, Decreased mobility, Decreased activity tolerance, Decreased range of motion, Postural dysfunction, Impaired sensation  Visit Diagnosis: Cervicalgia  Abnormal posture  Other disturbances of skin sensation     Problem List Patient Active Problem List   Diagnosis Date Noted  . HLD (hyperlipidemia) 12/21/2018  . Long-term current use of benzodiazepine 12/21/2018  . Ambien use disorder, mild ([place]) 12/21/2018  . Hemiplegia and hemiparesis following cerebral infarction affecting right dominant side ([place]) 12/21/2018  . Myelopathy ([place]) 04/08/2018  . Closed low lateral malleolus fracture 02/01/2018  . Paresthesia of upper and lower extremities of both sides 01/03/2018  . Elevated CK 01/03/2018  . Headache 11/11/2017  . Right sided weakness   . Vertigo   . Stroke ([place]) 10/05/2017  . Leukocytosis 10/05/2017  . Vitamin D deficiency  08/25/2017  . Hyperkalemia 08/25/2017  . Fatigue 11/23/2016  . Gastroesophageal reflux disease   . Recurrent hiatal hernia s/p redo robotic PEH/Nissen repair 01/22/2017   . Elevated blood pressure reading 12/12/2015  . Difficulty concentrating 06/20/2015  . Insomnia 05/30/2014  . Primary localized osteoarthritis of left hip 04/24/2014  . Elevated hemoglobin A1c 11/17/2012  . Erectile dysfunction 08/08/2012  . History of bleeding gastric ulcer status post repair 2012 (NSAIDs) 05/26/2011  . Anxiety 05/26/2011  . DJD (degenerative joint disease) 10/14/2010    Hilda Blades, PT, DPT, LAT, ATC 01/16/19  12:24 PM Phone:  [phone] Fax: [facility] [address] [place], [place], [address] Phone: [phone]   Fax:  [phone]  Name: [name] MRN: [id number] Date of Birth: 1970-11-01

## 2019-01-18 ENCOUNTER — Ambulatory Visit: Payer: No Typology Code available for payment source | Admitting: Physical Therapy

## 2019-01-18 ENCOUNTER — Emergency Department
Admission: EM | Admit: 2019-01-18 | Discharge: 2019-01-18 | Disposition: A | Discharge status: Home / Self Care | Payer: No Typology Code available for payment source | Attending: Emergency Medicine | Admitting: Emergency Medicine

## 2019-01-18 ENCOUNTER — Other Ambulatory Visit: Payer: Self-pay

## 2019-01-18 ENCOUNTER — Emergency Department: Payer: No Typology Code available for payment source

## 2019-01-18 DIAGNOSIS — D72829 Elevated white blood cell count, unspecified: Secondary | ICD-10-CM | POA: Diagnosis present

## 2019-01-18 DIAGNOSIS — N529 Male erectile dysfunction, unspecified: Secondary | ICD-10-CM | POA: Diagnosis present

## 2019-01-18 DIAGNOSIS — Z96642 Presence of left artificial hip joint: Secondary | ICD-10-CM | POA: Diagnosis not present

## 2019-01-18 DIAGNOSIS — R030 Elevated blood-pressure reading, without diagnosis of hypertension: Secondary | ICD-10-CM | POA: Diagnosis present

## 2019-01-18 DIAGNOSIS — R441 Visual hallucinations: Secondary | ICD-10-CM

## 2019-01-18 DIAGNOSIS — Z79899 Other long term (current) drug therapy: Secondary | ICD-10-CM | POA: Diagnosis not present

## 2019-01-18 DIAGNOSIS — E559 Vitamin D deficiency, unspecified: Secondary | ICD-10-CM | POA: Diagnosis present

## 2019-01-18 DIAGNOSIS — R519 Headache, unspecified: Secondary | ICD-10-CM | POA: Diagnosis present

## 2019-01-18 DIAGNOSIS — Z8673 Personal history of transient ischemic attack (TIA), and cerebral infarction without residual deficits: Secondary | ICD-10-CM | POA: Diagnosis present

## 2019-01-18 DIAGNOSIS — R7309 Other abnormal glucose: Secondary | ICD-10-CM | POA: Diagnosis present

## 2019-01-18 DIAGNOSIS — R41840 Attention and concentration deficit: Secondary | ICD-10-CM | POA: Diagnosis present

## 2019-01-18 DIAGNOSIS — R748 Abnormal levels of other serum enzymes: Secondary | ICD-10-CM | POA: Diagnosis present

## 2019-01-18 DIAGNOSIS — Z7982 Long term (current) use of aspirin: Secondary | ICD-10-CM | POA: Insufficient documentation

## 2019-01-18 DIAGNOSIS — I69351 Hemiplegia and hemiparesis following cerebral infarction affecting right dominant side: Secondary | ICD-10-CM

## 2019-01-18 DIAGNOSIS — R4182 Altered mental status, unspecified: Secondary | ICD-10-CM | POA: Diagnosis present

## 2019-01-18 DIAGNOSIS — S8263XA Displaced fracture of lateral malleolus of unspecified fibula, initial encounter for closed fracture: Secondary | ICD-10-CM | POA: Diagnosis present

## 2019-01-18 DIAGNOSIS — R410 Disorientation, unspecified: Secondary | ICD-10-CM | POA: Insufficient documentation

## 2019-01-18 DIAGNOSIS — R42 Dizziness and giddiness: Secondary | ICD-10-CM

## 2019-01-18 DIAGNOSIS — G959 Disease of spinal cord, unspecified: Secondary | ICD-10-CM | POA: Diagnosis present

## 2019-01-18 DIAGNOSIS — E875 Hyperkalemia: Secondary | ICD-10-CM | POA: Diagnosis present

## 2019-01-18 DIAGNOSIS — F1310 Sedative, hypnotic or anxiolytic abuse, uncomplicated: Secondary | ICD-10-CM | POA: Diagnosis present

## 2019-01-18 DIAGNOSIS — K219 Gastro-esophageal reflux disease without esophagitis: Secondary | ICD-10-CM | POA: Diagnosis present

## 2019-01-18 DIAGNOSIS — M1612 Unilateral primary osteoarthritis, left hip: Secondary | ICD-10-CM | POA: Diagnosis present

## 2019-01-18 DIAGNOSIS — G4700 Insomnia, unspecified: Secondary | ICD-10-CM | POA: Diagnosis present

## 2019-01-18 DIAGNOSIS — I639 Cerebral infarction, unspecified: Secondary | ICD-10-CM | POA: Diagnosis present

## 2019-01-18 DIAGNOSIS — F17220 Nicotine dependence, chewing tobacco, uncomplicated: Secondary | ICD-10-CM | POA: Diagnosis not present

## 2019-01-18 DIAGNOSIS — R202 Paresthesia of skin: Secondary | ICD-10-CM | POA: Diagnosis present

## 2019-01-18 DIAGNOSIS — K440 Diaphragmatic hernia with obstruction, without gangrene: Secondary | ICD-10-CM | POA: Diagnosis present

## 2019-01-18 DIAGNOSIS — R5383 Other fatigue: Secondary | ICD-10-CM | POA: Diagnosis present

## 2019-01-18 DIAGNOSIS — E1169 Type 2 diabetes mellitus with other specified complication: Secondary | ICD-10-CM | POA: Diagnosis present

## 2019-01-18 DIAGNOSIS — F419 Anxiety disorder, unspecified: Secondary | ICD-10-CM | POA: Diagnosis present

## 2019-01-18 DIAGNOSIS — M1990 Unspecified osteoarthritis, unspecified site: Secondary | ICD-10-CM | POA: Diagnosis present

## 2019-01-18 DIAGNOSIS — Z8719 Personal history of other diseases of the digestive system: Secondary | ICD-10-CM

## 2019-01-18 DIAGNOSIS — E785 Hyperlipidemia, unspecified: Secondary | ICD-10-CM | POA: Diagnosis present

## 2019-01-18 DIAGNOSIS — R531 Weakness: Secondary | ICD-10-CM

## 2019-01-18 LAB — URINE DRUG SCREEN, QUALITATIVE (ARMC ONLY)
Amphetamines, Ur Screen: NOT DETECTED
Barbiturates, Ur Screen: NOT DETECTED
Benzodiazepine, Ur Scrn: NOT DETECTED
Cannabinoid 50 Ng, Ur ~~LOC~~: NOT DETECTED
Cocaine Metabolite,Ur ~~LOC~~: NOT DETECTED
MDMA (Ecstasy)Ur Screen: NOT DETECTED
Methadone Scn, Ur: NOT DETECTED
Opiate, Ur Screen: NOT DETECTED
Phencyclidine (PCP) Ur S: NOT DETECTED
Tricyclic, Ur Screen: POSITIVE — AB

## 2019-01-18 LAB — URINALYSIS, ROUTINE W REFLEX MICROSCOPIC
Bilirubin Urine: NEGATIVE
Glucose, UA: NEGATIVE mg/dL
Hgb urine dipstick: NEGATIVE
Ketones, ur: NEGATIVE mg/dL
Leukocytes,Ua: NEGATIVE
Nitrite: NEGATIVE
Protein, ur: NEGATIVE mg/dL
Specific Gravity, Urine: 1.012 (ref 1.005–1.030)
pH: 5 (ref 5.0–8.0)

## 2019-01-18 LAB — CBC WITH DIFFERENTIAL/PLATELET
Abs Immature Granulocytes: 0.1 10*3/uL — ABNORMAL HIGH (ref 0.00–0.07)
Basophils Absolute: 0.1 10*3/uL (ref 0.0–0.1)
Basophils Relative: 1 %
Eosinophils Absolute: 0.2 10*3/uL (ref 0.0–0.5)
Eosinophils Relative: 1 %
HCT: 40.4 % (ref 39.0–52.0)
Hemoglobin: 13.5 g/dL (ref 13.0–17.0)
Immature Granulocytes: 1 %
Lymphocytes Relative: 22 %
Lymphs Abs: 2.6 10*3/uL (ref 0.7–4.0)
MCH: 30.5 pg (ref 26.0–34.0)
MCHC: 33.4 g/dL (ref 30.0–36.0)
MCV: 91.4 fL (ref 80.0–100.0)
Monocytes Absolute: 1 10*3/uL (ref 0.1–1.0)
Monocytes Relative: 9 %
Neutro Abs: 8 10*3/uL — ABNORMAL HIGH (ref 1.7–7.7)
Neutrophils Relative %: 66 %
Platelets: 297 10*3/uL (ref 150–400)
RBC: 4.42 MIL/uL (ref 4.22–5.81)
RDW: 13 % (ref 11.5–15.5)
WBC: 11.9 10*3/uL — ABNORMAL HIGH (ref 4.0–10.5)
nRBC: 0 % (ref 0.0–0.2)

## 2019-01-18 LAB — SALICYLATE LEVEL: Salicylate Lvl: <7 mg/dL (ref 2.8–30.0)

## 2019-01-18 LAB — COMPREHENSIVE METABOLIC PANEL
ALT: 27 U/L (ref 0–44)
AST: 27 U/L (ref 15–41)
Albumin: 4 g/dL (ref 3.5–5.0)
Alkaline Phosphatase: 59 U/L (ref 38–126)
Anion gap: 10 (ref 5–15)
BUN: 17 mg/dL (ref 6–20)
CO2: 23 mmol/L (ref 22–32)
Calcium: 8.9 mg/dL (ref 8.9–10.3)
Chloride: 103 mmol/L (ref 98–111)
Creatinine, Ser: 1.03 mg/dL (ref 0.61–1.24)
GFR calc Af Amer: >60 mL/min (ref >60)
GFR calc non Af Amer: >60 mL/min (ref >60)
Glucose, Bld: 110 mg/dL — ABNORMAL HIGH (ref 70–99)
Potassium: 4.3 mmol/L (ref 3.5–5.1)
Sodium: 136 mmol/L (ref 135–145)
Total Bilirubin: 0.5 mg/dL (ref 0.3–1.2)
Total Protein: 6.7 g/dL (ref 6.5–8.1)

## 2019-01-18 LAB — ETHANOL: Alcohol, Ethyl (B): <10 mg/dL (ref <10)

## 2019-01-18 LAB — ACETAMINOPHEN LEVEL: Acetaminophen (Tylenol), Serum: <10 ug/mL — ABNORMAL LOW (ref 10–30)

## 2019-01-18 NOTE — Consult Note (Signed)
[facility] Consult   Reason for Consult: Altered mental status Referring Physician: Dr. Karma [name] Patient Identification: [name] MRN:  [id number] Principal Diagnosis: Hallucinations, visual Diagnosis:  Principal Problem:   Hallucinations, visual Active Problems:   DJD (degenerative joint disease)   History of bleeding gastric ulcer status post repair 2012 (NSAIDs)   Anxiety   Erectile dysfunction   Elevated hemoglobin A1c   Primary localized osteoarthritis of left hip   Insomnia   Difficulty concentrating   Elevated blood pressure reading   Gastroesophageal reflux disease   Recurrent hiatal hernia s/p redo robotic PEH/Nissen repair 01/22/2017   Fatigue   Vitamin D deficiency   Hyperkalemia   Stroke (HCC)   Leukocytosis   Right sided weakness   Vertigo   Headache   Paresthesia of upper and lower extremities of both sides   Elevated CK   Closed low lateral malleolus fracture   Myelopathy (HCC)   HLD (hyperlipidemia)   Long-term current use of benzodiazepine   Ambien use disorder, mild (HCC)   Hemiplegia and hemiparesis following cerebral infarction affecting right dominant side ([place])   Total Time spent with patient: 30 minutes  Subjective: "I am doing well." [name] is a 48 y.o. male patient presented to [facility] ED via [facility] with patient brother at his side.  Per the ER ED triage nursing note, the patient was found trying to break into his neighbor's window.  The patient was unaware of the place/time/situation.  The patient's memory was altered during his initial assessment in triage.  When this provider saw the patient, he is alert and oriented x3 and answered all questions that were asked appropriately.  The patient was seen face-to-face by this provider; chart reviewed and consulted with Dr. Karma [name] on 01/18/2019 due to the patient's care. It was discussed with the EDP that the patient does not meet criteria to be admitted to the psychiatric  inpatient unit.  The patient is alert and oriented x 3, calm, cooperative, and mood-congruent with affect on evaluation. The patient does not appear to be responding to internal or external stimuli. Neither is the patient presenting with any delusional thinking. The patient denies auditory or visual hallucinations. The patient denies any suicidal, homicidal, or self-harm ideations. The patient is not presenting with any psychotic or paranoid behaviors. During an encounter with the patient, he/she was able to answer questions appropriately. Collateral was obtained by Mr. [name], who is at the patient's bedside.  He was also able to elaborate on the patient illness and what has transpired since his stroke.  He denies that the patient has ever seen a psychiatrist but admitted the patient seeing a therapist.  He states that the patient has been stressed over his job and his illness. Plan: The patient is not a safety risk to self or others and does not require psychiatric inpatient admission for stabilization and treatment.  HPI: Per Dr. Karma [name]; [name] is a 48 y.o. male with medical history as listed below which includes a stroke a few years ago according to his brother who is here at the bedside.  The patient presents tonight by EMS for altered mental status.  Reportedly he was found her to break into a neighbor's window because he says someone was chasing him.  He was disoriented to the situation and the time.  He was in no distress and not complaining of anything except for chronic pain in his neck and back.  He says that the  year is 2099 and he is not sure what is happening.  He denies shortness of breath and chest pain.  He denies alcohol and substance abuse; his older brother who is in Event organiser and here at the bedside says that he has not had a drink for years and has not drugs.  The patient has some chronic numbness in his hands since his stroke within the last couple of years (according to  the patient it was last year, the brother thought it might be a couple years ago) but no acute weakness.  Symptoms seem to be acute in onset and his brother states that he talked to him earlier tonight and he was completely normal.  Past Psychiatric History: Anxiety Depression  Risk to Self:  No Risk to Others:  No Prior Inpatient Therapy:  No Prior Outpatient Therapy:  Yes (therapy)  Past Medical History:  Past Medical History:  Diagnosis Date  . Anxiety    takes Xanax daily prn, panic attacks  . Chronic gastric ulcer with bleeding s/p suture repair 2012 09/29/2010   Head 3 EGD which failed to stop the bleeding. Had exploratory laparotomy on 10/04/2010. Possible cause of the gastric ulcer was NSAID use.   . Constipation    related to pain meds  . Depression   . Dizziness    occasionally  . Dyspnea    walking a bit  . Erosive esophagitis   . External hemorrhoid, bleeding   . Gastric ulcer    history of  . GERD (gastroesophageal reflux disease)    takes Protonix bid  . Hiatal hernia   . History of blood clots 2012   in abdomen  . History of blood transfusion 2012  . History of blood transfusion   . Incisional hernia s/p open repair w mesh Aug 2013 05/26/2011  . Joint pain    knees  . Nocturia    depends on amount of fluid he drinks  . Pneumonia 2018  . Pre-diabetes   . Primary localized osteoarthritis of left hip 04/24/2014  . Serrated polyp of colon   . Sleep apnea    no cpap  mild no cpap needed  . Stroke ([place])   . Upper GI bleed   . Ventral hernia     Past Surgical History:  Procedure Laterality Date  . ANTERIOR CERVICAL DECOMPRESSION/DISCECTOMY FUSION 4 LEVELS N/A 04/08/2018   Procedure: ACDF - C3-C4 - C4-C5 - C5-C6 - C6-C7;  Surgeon: Bruce Kos, MD;  Location: [facility];  Service: Neurosurgery;  Laterality: N/A;  . COLONOSCOPY    . ESOPHAGEAL MANOMETRY N/A 10/19/2016   Procedure: ESOPHAGEAL MANOMETRY (EM);  Surgeon: Bruce Pole, MD;  Location: [facility];   Service: Endoscopy;  Laterality: N/A;  . ESOPHAGOGASTRODUODENOSCOPY    . ESOPHAGOGASTRODUODENOSCOPY N/A 01/22/2017   Procedure: ESOPHAGOGASTRODUODENOSCOPY (EGD);  Surgeon: [name] Boston, MD;  Location: [facility];  Service: General;  Laterality: N/A;  . INCISIONAL HERNIA REPAIR  09/04/2011   Procedure: HERNIA REPAIR INCISIONAL;  Surgeon: Madilyn Hook, DO;  Location: [facility];  Service: General;  Laterality: N/A;  debridment calcified mass  . INSERTION OF MESH  09/04/2011   retrorectus ultrapro "30x30cm"  . INSERTION OF MESH N/A 01/22/2017   Procedure: INSERTION OF MESH;  Surgeon: [name] Boston, MD;  Location: [facility];  Service: General;  Laterality: N/A;  . NISSEN FUNDOPLICATION     Open [id number]  . POLYPECTOMY    . STOMACH SURGERY  10/05/2010   Oversewing of gastric ulcer.  Dr  Cornett  . TOTAL HIP ARTHROPLASTY Left 04/24/2014   Procedure: LEFT TOTAL HIP ARTHROPLASTY;  Surgeon: Marchia Bond, MD;  Location: [facility];  Service: Orthopedics;  Laterality: Left;  . UPPER GASTROINTESTINAL ENDOSCOPY     Family History:  Family History  Problem Relation Age of Onset  . Diabetes Mother   . Liver disease Mother   . Diabetes Father   . Heart disease Father   . Healthy Sister   . Healthy Brother   . Healthy Brother   . Colon cancer Neg Hx   . Stomach cancer Neg Hx   . Rectal cancer Neg Hx   . Esophageal cancer Neg Hx   . Colon polyps Neg Hx    Family Psychiatric  History: History reviewed. No pertinent family psychiatric history Social History:  Social History   Substance and Sexual Activity  Alcohol Use Yes  . Alcohol/week: 0.0 standard drinks   Comment: 09/04/11 "very seldom"     Social History   Substance and Sexual Activity  Drug Use No    Social History   Socioeconomic History  . Marital status: Single    Spouse name: Not on file  . Number of children: 1  . Years of education: 64  . Highest education level: High school graduate  Occupational History  . Occupation: Maintenance - [employer]  Tobacco Use  . Smoking status: Never Smoker  . Smokeless tobacco: Current User    Types: Chew  . Tobacco comment: occ-Chew  Substance and Sexual Activity  . Alcohol use: Yes    Alcohol/week: 0.0 standard drinks    Comment: 09/04/11 "very seldom"  . Drug use: No  . Sexual activity: Yes  Other Topics Concern  . Not on file  Social History Narrative   Lives with brother in a one story home.  Has one daughter.  Works in Theatre manager for [employer].  Education: high school.       Patient is left-handed.   Social Determinants of Health   Financial Resource Strain:   . Difficulty of Paying Living Expenses: Not on file  Food Insecurity:   . Worried About Charity fundraiser in the Last Year: Not on file  . Ran Out of Food in the Last Year: Not on file  Transportation Needs:   . Lack of Transportation (Medical): Not on file  . Lack of Transportation (Non-Medical): Not on file  Physical Activity:   . Days of Exercise per Week: Not on file  . Minutes of Exercise per Session: Not on file  Stress:   . Feeling of Stress : Not on file  Social Connections:   . Frequency of Communication with Friends and Family: Not on file  . Frequency of Social Gatherings with Friends and Family: Not on file  . Attends Religious Services: Not on file  . Active Member of Clubs or Organizations: Not on file  . Attends Archivist Meetings: Not on file  . Marital Status: Not on file   Additional Social History:    Allergies:   Allergies  Allergen Reactions  . Dilaudid [Hydromorphone Hcl] Rash and Other (See Comments)    Shakey  . Metoclopramide Hcl Hives, Swelling, Rash and Other (See Comments)    Swelling in lips  . Nsaids     H/o bleeding gastric ulcers  . Benadryl [Diphenhydramine]     Causes patient to become hyper and pace    Labs:  Results for orders placed or performed during the hospital  encounter of 01/18/19 (from the past 48 hour(s))  CBC with Differential     Status:  Abnormal   Collection Time: 01/18/19 12:45 AM  Result Value Ref Range   WBC 11.9 (H) 4.0 - 10.5 K/uL   RBC 4.42 4.22 - 5.81 MIL/uL   Hemoglobin 13.5 13.0 - 17.0 g/dL   HCT 40.4 39.0 - 52.0 %   MCV 91.4 80.0 - 100.0 fL   MCH 30.5 26.0 - 34.0 pg   MCHC 33.4 30.0 - 36.0 g/dL   RDW 13.0 11.5 - 15.5 %   Platelets 297 150 - 400 K/uL   nRBC 0.0 0.0 - 0.2 %   Neutrophils Relative % 66 %   Neutro Abs 8.0 (H) 1.7 - 7.7 K/uL   Lymphocytes Relative 22 %   Lymphs Abs 2.6 0.7 - 4.0 K/uL   Monocytes Relative 9 %   Monocytes Absolute 1.0 0.1 - 1.0 K/uL   Eosinophils Relative 1 %   Eosinophils Absolute 0.2 0.0 - 0.5 K/uL   Basophils Relative 1 %   Basophils Absolute 0.1 0.0 - 0.1 K/uL   Immature Granulocytes 1 %   Abs Immature Granulocytes 0.10 (H) 0.00 - 0.07 K/uL    Comment: Performed at [facility], [address] [place], [place] [address]  Urine Drug Screen, Qualitative ([facility] only)     Status: Abnormal   Collection Time: 01/18/19 12:45 AM  Result Value Ref Range   Tricyclic, Ur Screen POSITIVE (A) NONE DETECTED   Amphetamines, Ur Screen NONE DETECTED NONE DETECTED   MDMA (Ecstasy)Ur Screen NONE DETECTED NONE DETECTED   Cocaine Metabolite,Ur [place] NONE DETECTED NONE DETECTED   Opiate, Ur Screen NONE DETECTED NONE DETECTED   Phencyclidine (PCP) Ur S NONE DETECTED NONE DETECTED   Cannabinoid 50 Ng, Ur [place] NONE DETECTED NONE DETECTED   Barbiturates, Ur Screen NONE DETECTED NONE DETECTED   Benzodiazepine, Ur Scrn NONE DETECTED NONE DETECTED   Methadone Scn, Ur NONE DETECTED NONE DETECTED    Comment: (NOTE) Tricyclics + metabolites, urine    Cutoff 1000 ng/mL Amphetamines + metabolites, urine  Cutoff 1000 ng/mL MDMA (Ecstasy), urine              Cutoff 500 ng/mL Cocaine Metabolite, urine          Cutoff 300 ng/mL Opiate + metabolites, urine        Cutoff 300 ng/mL Phencyclidine (PCP), urine         Cutoff 25 ng/mL Cannabinoid, urine                 Cutoff 50 ng/mL Barbiturates +  metabolites, urine  Cutoff 200 ng/mL Benzodiazepine, urine              Cutoff 200 ng/mL Methadone, urine                   Cutoff 300 ng/mL The urine drug screen provides only a preliminary, unconfirmed analytical test result and should not be used for non-medical purposes. Clinical consideration and professional judgment should be applied to any positive drug screen result due to possible interfering substances. A more specific alternate chemical method must be used in order to obtain a confirmed analytical result. Gas chromatography / mass spectrometry (GC/MS) is the preferred confirmat ory method. Performed at [facility], [address] [place], [place] [address]   Urinalysis, Routine w reflex microscopic     Status: Abnormal   Collection Time: 01/18/19 12:45 AM  Result Value Ref  Range   Color, Urine STRAW (A) YELLOW   APPearance CLEAR (A) CLEAR   Specific Gravity, Urine 1.012 1.005 - 1.030   pH 5.0 5.0 - 8.0   Glucose, UA NEGATIVE NEGATIVE mg/dL   Hgb urine dipstick NEGATIVE NEGATIVE   Bilirubin Urine NEGATIVE NEGATIVE   Ketones, ur NEGATIVE NEGATIVE mg/dL   Protein, ur NEGATIVE NEGATIVE mg/dL   Nitrite NEGATIVE NEGATIVE   Leukocytes,Ua NEGATIVE NEGATIVE    Comment: Performed at [facility], [address] [place], [place] [address]  Acetaminophen level     Status: Abnormal   Collection Time: 01/18/19  1:34 AM  Result Value Ref Range   Acetaminophen (Tylenol), Serum <10 (L) 10 - 30 ug/mL    Comment: (NOTE) Therapeutic concentrations vary significantly. A range of 10-30 ug/mL  may be an effective concentration for many patients. However, some  are best treated at concentrations outside of this range. Acetaminophen concentrations >150 ug/mL at 4 hours after ingestion  and >50 ug/mL at 12 hours after ingestion are often associated with  toxic reactions. Performed at [facility], [address] [place], [place] [address]   Ethanol      Status: None   Collection Time: 01/18/19  1:34 AM  Result Value Ref Range   Alcohol, Ethyl (B) <10 <10 mg/dL    Comment: (NOTE) Lowest detectable limit for serum alcohol is 10 mg/dL. For medical purposes only. Performed at [facility], [address] [place], [place] [address]   Comprehensive metabolic panel     Status: Abnormal   Collection Time: 01/18/19  1:34 AM  Result Value Ref Range   Sodium 136 135 - 145 mmol/L   Potassium 4.3 3.5 - 5.1 mmol/L   Chloride 103 98 - 111 mmol/L   CO2 23 22 - 32 mmol/L   Glucose, Bld 110 (H) 70 - 99 mg/dL   BUN 17 6 - 20 mg/dL   Creatinine, Ser 1.03 0.61 - 1.24 mg/dL   Calcium 8.9 8.9 - 10.3 mg/dL   Total Protein 6.7 6.5 - 8.1 g/dL   Albumin 4.0 3.5 - 5.0 g/dL   AST 27 15 - 41 U/L   ALT 27 0 - 44 U/L   Alkaline Phosphatase 59 38 - 126 U/L   Total Bilirubin 0.5 0.3 - 1.2 mg/dL   GFR calc non Af Amer >60 >60 mL/min   GFR calc Af Amer >60 >60 mL/min   Anion gap 10 5 - 15    Comment: Performed at [facility], [address] [place], [place] [id number]  Salicylate level     Status: None   Collection Time: 01/18/19  1:34 AM  Result Value Ref Range   Salicylate Lvl [id number] 2.8 - 30.0 mg/dL    Comment: Performed at [facility], [address] [place], [place] [address]    No current facility-administered medications for this encounter.   Current Outpatient Medications  Medication Sig Dispense Refill  . acetaminophen (TYLENOL) 500 MG tablet Take 1,000 mg by mouth every 6 (six) hours as needed for mild pain or headache.    Marland Kitchen aspirin EC 81 MG EC tablet Take 1 tablet (81 mg total) by mouth daily.    . clonazePAM (KLONOPIN) 0.5 MG tablet Take 1 tablet (0.5 mg total) by mouth at bedtime. 30 tablet 5  . cyclobenzaprine (FLEXERIL) 10 MG tablet Take 1 tablet (10 mg total) by mouth 3 (three) times daily as needed for muscle spasms. 30 tablet 0  .  fluticasone (FLONASE) 50 MCG/ACT nasal spray Place 2 sprays into both  nostrils daily as needed for allergies. 16 g 11  . gabapentin (NEURONTIN) 300 MG capsule Take 1 capsule (300 mg total) by mouth 3 (three) times daily. 90 capsule 3  . HYDROcodone-acetaminophen (NORCO) 10-325 MG tablet Take 1 tablet by mouth every 6 (six) hours as needed. 30 tablet 0  . nortriptyline (PAMELOR) 25 MG capsule Take 1 capsule (25 mg total) by mouth at bedtime. 30 capsule 5  . ondansetron (ZOFRAN) 4 MG tablet TAKE 1 TABLET BY MOUTH EVERY 8 HOURS AS NEEDED FOR NAUSEA OR VOMITING. 20 tablet 1  . pantoprazole (PROTONIX) 40 MG tablet Take 1 tablet (40 mg total) by mouth 2 (two) times daily. 180 tablet 2  . simvastatin (ZOCOR) 10 MG tablet Take 1 tablet (10 mg total) by mouth daily. 90 tablet 3  . Vitamin D, Ergocalciferol, (DRISDOL) 1.25 MG (50000 UT) CAPS capsule Take 1 capsule (50,000 Units total) by mouth every 7 (seven) days. On Monday 12 capsule 0  . zolpidem (AMBIEN CR) 12.5 MG CR tablet Take 1 tablet (12.5 mg total) by mouth at bedtime as needed. for sleep 30 tablet 1    Musculoskeletal: Strength & Muscle Tone: within normal limits Gait & Station: normal Patient leans: N/A  Psychiatric Specialty Exam: Physical Exam  Nursing note and vitals reviewed.   Review of Systems  Blood pressure (!) 139/110, pulse 100, resp. rate 18, height 5\' 9"  (1.753 m), weight 68 kg, SpO2 95 %.Body mass index is 22.15 kg/m.  General Appearance: Casual  Eye Contact:  Fair  Speech:  Clear and Coherent  Volume:  Normal  Mood:  NA  Affect:  Appropriate and Congruent  Thought Process:  Coherent  Orientation:  Full (Time, Place, and Person)  Thought Content:  WDL and Logical  Suicidal Thoughts:  No  Homicidal Thoughts:  No  Memory:  Immediate;   Good  Judgement:  Good  Insight:  Fair  Psychomotor Activity:  Normal  Concentration:  Concentration: Good  Recall:  Good  Fund of Knowledge:  Good  Language:  Good  Akathisia:  Negative  Handed:  Right  AIMS (if indicated):     Assets:   Communication Skills Desire for Improvement Physical Health Social Support  ADL's:  Intact  Cognition:  WNL  Sleep:   Good     Treatment Plan Summary: Plan Patient does not meet criteria for psychiatric inpatient admission.  Disposition: No evidence of imminent risk to self or others at present.   Patient does not meet criteria for psychiatric inpatient admission. Supportive therapy provided about ongoing stressors.  Caroline Sauger, NP 01/18/2019 5:47 AM

## 2019-01-18 NOTE — Discharge Instructions (Signed)
Your workup in the Emergency Department today was reassuring.  We did not find any specific abnormalities.  We recommend you drink plenty of fluids, take your regular medications and/or any new ones prescribed today, and follow up with the doctor(s) listed in these documents as recommended.  Return to the Emergency Department if you develop new or worsening symptoms that concern you.  

## 2019-01-18 NOTE — ED Notes (Signed)
Patient trying to get out of bed, redirectable but sitter requested for  bedside
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
## 2019-01-18 NOTE — ED Notes (Signed)
Patient transported to MRI 
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
## 2019-01-23 ENCOUNTER — Ambulatory Visit: Payer: No Typology Code available for payment source | Admitting: Physical Therapy

## 2019-01-23 ENCOUNTER — Other Ambulatory Visit: Payer: Self-pay

## 2019-01-23 ENCOUNTER — Encounter: Payer: Self-pay | Admitting: Physical Therapy

## 2019-01-23 DIAGNOSIS — R208 Other disturbances of skin sensation: Secondary | ICD-10-CM

## 2019-01-23 DIAGNOSIS — M542 Cervicalgia: Secondary | ICD-10-CM | POA: Diagnosis not present

## 2019-01-23 DIAGNOSIS — R293 Abnormal posture: Secondary | ICD-10-CM

## 2019-01-23 NOTE — Therapy (Signed)
[place], [place], [address] Phone: [phone]   Fax:  [phone]  Physical Therapy Treatment  Patient Details  Name: [name] MRN: [id number] Date of Birth: 09-18-1970 Referring Provider (PT): Clydell Hakim, MD    Encounter Date: 01/23/2019  PT End of Session - 01/23/19 0937    Visit Number  5    Number of Visits  13    Date for PT Re-Evaluation  02/16/19    Authorization Type  [facility] [employer]    PT Start Time  0911    PT Stop Time  0950    PT Time Calculation (min)  39 min    Activity Tolerance  Patient tolerated treatment well    Behavior During Therapy  [facility] for tasks assessed/performed       Past Medical History:  Diagnosis Date  . Anxiety    takes Xanax daily prn, panic attacks  . Chronic gastric ulcer with bleeding s/p suture repair 2012 09/29/2010   Head 3 EGD which failed to stop the bleeding. Had exploratory laparotomy on 10/04/2010. Possible cause of the gastric ulcer was NSAID use.   . Constipation    related to pain meds  . Depression   . Dizziness    occasionally  . Dyspnea    walking a bit  . Erosive esophagitis   . External hemorrhoid, bleeding   . Gastric ulcer    history of  . GERD (gastroesophageal reflux disease)    takes Protonix bid  . Hiatal hernia   . History of blood clots 2012   in abdomen  . History of blood transfusion 2012  . History of blood transfusion   . Incisional hernia s/p open repair w mesh Aug 2013 05/26/2011  . Joint pain    knees  . Nocturia    depends on amount of fluid he drinks  . Pneumonia 2018  . Pre-diabetes   . Primary localized osteoarthritis of left hip 04/24/2014  . Serrated polyp of colon   . Sleep apnea    no cpap  mild no cpap needed  . Stroke ([place])   . Upper GI bleed   . Ventral hernia     Past Surgical History:  Procedure Laterality Date  . ANTERIOR CERVICAL DECOMPRESSION/DISCECTOMY FUSION 4 LEVELS N/A 04/08/2018   Procedure:  ACDF - C3-C4 - C4-C5 - C5-C6 - C6-C7;  Surgeon: Kary Kos, MD;  Location: [facility];  Service: Neurosurgery;  Laterality: N/A;  . COLONOSCOPY    . ESOPHAGEAL MANOMETRY N/A 10/19/2016   Procedure: ESOPHAGEAL MANOMETRY (EM);  Surgeon: Mauri [place], MD;  [facility];  Service: Endoscopy;  Laterality: N/A;  . ESOPHAGOGASTRODUODENOSCOPY    . ESOPHAGOGASTRODUODENOSCOPY N/A 01/22/2017   Procedure: ESOPHAGOGASTRODUODENOSCOPY (EGD);  Surgeon: [name], MD;  [facility]: [facility];  Service: General;  Laterality: N/A;  . INCISIONAL HERNIA REPAIR  09/04/2011   Procedure: HERNIA REPAIR INCISIONAL;  Surgeon: Madilyn Hook, DO;  Location: [facility];  Service: General;  Laterality: N/A;  debridment calcified mass  . INSERTION OF MESH  09/04/2011   retrorectus ultrapro "30x30cm"  . INSERTION OF MESH N/A 01/22/2017   Procedure: INSERTION OF MESH;  Surgeon: [name], MD;  [facility];  Service: General;  Laterality: N/A;  . NISSEN FUNDOPLICATION     Open [id number]  . POLYPECTOMY    . STOMACH SURGERY  10/05/2010   Oversewing of gastric ulcer.  Dr Brantley Stage  . TOTAL HIP ARTHROPLASTY Left 04/24/2014  Procedure: LEFT TOTAL HIP ARTHROPLASTY;  Surgeon: Marchia Bond, MD;  Location: [place];  Service: Orthopedics;  Laterality: Left;  . UPPER GASTROINTESTINAL ENDOSCOPY      There were no vitals filed for this visit.  Subjective Assessment - 01/23/19 0919    Subjective  Patient reports he is doing well. He states the dry needling last visit helped to loosen up his muscles.    Currently in Pain?  Yes    Pain Score  3     Pain Location  Neck    Pain Orientation  Left;Posterior    Pain Descriptors / Indicators  Aching    Pain Type  Acute pain    Pain Radiating Towards  Numbness in bilat arms/hands    Pain Onset  More than a month ago    Pain Frequency  Constant                       [facility] Adult PT Treatment/Exercise - 01/23/19 0001      Exercises   Exercises  Neck      Neck  Exercises: Machines for Strengthening   UBE (Upper Arm Bike)  L1 x 4 min (2 fwd/2 bwd)      Neck Exercises: Theraband   Rows  15 reps;Red   2 sets   Shoulder External Rotation  10 reps   2 sets   Shoulder External Rotation Limitations  Bilateral with yellow band      Neck Exercises: Sidelying   Other Sidelying Exercise  Side lying thoracic rotation stretch 2x10 each      Neck Exercises: Prone   Other Prone Exercise  Quadruped thoracic rotation stretch 2x10 each      Manual Therapy   Manual Therapy  Soft tissue mobilization;Myofascial release    Soft tissue mobilization  Left rhomboid    Myofascial Release  Left rhomboid      Neck Exercises: Stretches   Upper Trapezius Stretch  2 reps;30 seconds    Levator Stretch  2 reps;30 seconds    Other Neck Stretches  Doorway rhomboid stretch 3x30 sec       Trigger Point Dry Needling - 01/23/19 0001    Consent Given?  Yes    Muscles Treated Upper Quadrant  Rhomboids    Rhomboids Response  Twitch response elicited             PT Short Term Goals - 01/05/19 1059      PT SHORT TERM GOAL #1   Title  pt to be I with inital HEP    Time  3    Period  Weeks    Status  New    Target Date  01/26/19      PT SHORT TERM GOAL #2   Title  pt to verbalize/ demo good posture and lifting mechanics to reduce and prevent neck/ shoulder pain and UE N/T    Time  3    Period  Weeks    Status  New    Target Date  01/26/19        PT Long Term Goals - 01/05/19 1059      PT LONG TERM GOAL #1   Title  pt to increase gross cervical ROM by >/= 8 degrees in all planes to maximize overal function with no report of pain    Time  6    Period  Weeks    Status  New    Target Date  02/16/19  PT LONG TERM GOAL #2   Title  pt to report N/T in bil hands to intermittent to promote improvement in condition and QOL    Time  8    Period  Weeks    Status  New    Target Date  03/02/19      PT LONG TERM GOAL #3   Title  increase gross shoulder  strength to >/= 4+/5 with report of L shoulder pain to promote lifting and carrying required for work related activities.    Time  6    Period  Weeks    Status  New    Target Date  02/16/19      PT LONG TERM GOAL #4   Title  pt to be I with all HEP given as of last visit to maintain and progress current level of function    Time  6    Period  Weeks    Status  New    Target Date  02/16/19            Plan - 01/23/19 0939    Clinical Impression Statement  Patient seems to be improving with dry needling in combination with other mobility and strengthening exercises. He continues to have the bilateral hand numbness and this has not changed even with improvement in neck and upper back pain/tightness. He continues to require cueing for posture and avoiding shoulder shrug. He would benefit from continued skilled PT to reduce his neck/upper back pain and tightness.    PT Treatment/Interventions  ADLs/Self Care Home Management;Cryotherapy;Electrical Stimulation;Iontophoresis 4mg /ml Dexamethasone;Moist Heat;Traction;Ultrasound;Therapeutic activities;Therapeutic exercise;Neuromuscular re-education;Patient/family education;Manual techniques;Passive range of motion;Dry needling;Taping    PT Next Visit Plan  review/ update HEP, STW along left upper trap and rhomboid region, thoracic mobility, scapular stability/cervical isometrics, modalities PRN, posture education.    PT Home Exercise Plan  Seated chin tuck (with protraction), prone I, double ER with yellow, sidelying thoracic rotation open book stretch, self massage with ball against wall    Consulted and Agree with Plan of Care  Patient       Patient will benefit from skilled therapeutic intervention in order to improve the following deficits and impairments:  Improper body mechanics, Increased muscle spasms, Decreased strength, Pain, Decreased endurance, Decreased mobility, Decreased activity tolerance, Decreased range of motion, Postural  dysfunction, Impaired sensation  Visit Diagnosis: Cervicalgia  Abnormal posture  Other disturbances of skin sensation     Problem List Patient Active Problem List   Diagnosis Date Noted  . Hallucinations, visual 01/18/2019  . HLD (hyperlipidemia) 12/21/2018  . Long-term current use of benzodiazepine 12/21/2018  . Ambien use disorder, mild ([place]) 12/21/2018  . Hemiplegia and hemiparesis following cerebral infarction affecting right dominant side ([place]) 12/21/2018  . Myelopathy ([place]) 04/08/2018  . Closed low lateral malleolus fracture 02/01/2018  . Paresthesia of upper and lower extremities of both sides 01/03/2018  . Elevated CK 01/03/2018  . Headache 11/11/2017  . Right sided weakness   . Vertigo   . Stroke ([place]) 10/05/2017  . Leukocytosis 10/05/2017  . Vitamin D deficiency 08/25/2017  . Hyperkalemia 08/25/2017  . Fatigue 11/23/2016  . Gastroesophageal reflux disease   . Recurrent hiatal hernia s/p redo robotic PEH/Nissen repair 01/22/2017   . Elevated blood pressure reading 12/12/2015  . Difficulty concentrating 06/20/2015  . Insomnia 05/30/2014  . Primary localized osteoarthritis of left hip 04/24/2014  . Elevated hemoglobin A1c 11/17/2012  . Erectile dysfunction 08/08/2012  . History of bleeding gastric ulcer status post repair 2012 (  NSAIDs) 05/26/2011  . Anxiety 05/26/2011  . DJD (degenerative joint disease) 10/14/2010    Hilda Blades, PT, DPT, LAT, ATC 01/23/19  10:03 AM Phone: [phone] Fax: [facility] [address] [place], [place], [address] Phone: [phone]   Fax:  [phone]  Name: [name] MRN: [id number] Date of Birth: 07-May-1970

## 2019-01-25 ENCOUNTER — Other Ambulatory Visit: Payer: Self-pay

## 2019-01-25 ENCOUNTER — Encounter: Payer: Self-pay | Admitting: Physical Therapy

## 2019-01-25 ENCOUNTER — Ambulatory Visit: Payer: No Typology Code available for payment source | Admitting: Physical Therapy

## 2019-01-25 DIAGNOSIS — R208 Other disturbances of skin sensation: Secondary | ICD-10-CM

## 2019-01-25 DIAGNOSIS — M542 Cervicalgia: Secondary | ICD-10-CM | POA: Diagnosis not present

## 2019-01-25 DIAGNOSIS — R293 Abnormal posture: Secondary | ICD-10-CM

## 2019-01-25 NOTE — Therapy (Signed)
[place], [place], [address] Phone: [phone]   Fax:  [phone]  Physical Therapy Treatment  Patient Details  Name: [name] MRN: [id number] Date of Birth: [birth year] Referring Provider (PT): Clydell Hakim, MD    Encounter Date: 01/25/2019  PT End of Session - 01/25/19 1101    Visit Number  6    Number of Visits  13    Date for PT Re-Evaluation  02/16/19    Authorization Type  [facility]    PT Start Time  1100    PT Stop Time  1143    PT Time Calculation (min)  43 min    Activity Tolerance  Patient tolerated treatment well    Behavior During Therapy  [facility] for tasks assessed/performed       Past Medical History:  Diagnosis Date  . Anxiety    takes Xanax daily prn, panic attacks  . Chronic gastric ulcer with bleeding s/p suture repair 2012 09/29/2010   Head 3 EGD which failed to stop the bleeding. Had exploratory laparotomy on 10/04/2010. Possible cause of the gastric ulcer was NSAID use.   . Constipation    related to pain meds  . Depression   . Dizziness    occasionally  . Dyspnea    walking a bit  . Erosive esophagitis   . External hemorrhoid, bleeding   . Gastric ulcer    history of  . GERD (gastroesophageal reflux disease)    takes Protonix bid  . Hiatal hernia   . History of blood clots 2012   in abdomen  . History of blood transfusion 2012  . History of blood transfusion   . Incisional hernia s/p open repair w mesh Aug 2013 05/26/2011  . Joint pain    knees  . Nocturia    depends on amount of fluid he drinks  . Pneumonia 2018  . Pre-diabetes   . Primary localized osteoarthritis of left hip 04/24/2014  . Serrated polyp of colon   . Sleep apnea    no cpap  mild no cpap needed  . Stroke ([place])   . Upper GI bleed   . Ventral hernia     Past Surgical History:  Procedure Laterality Date  . ANTERIOR CERVICAL DECOMPRESSION/DISCECTOMY FUSION 4 LEVELS N/A 04/08/2018   Procedure:  ACDF - C3-C4 - C4-C5 - C5-C6 - C6-C7;  Surgeon: Kary Kos, MD;  Location: [facility];  Service: Neurosurgery;  Laterality: N/A;  . COLONOSCOPY    . ESOPHAGEAL MANOMETRY N/A 10/19/2016   Procedure: ESOPHAGEAL MANOMETRY (EM);  Surgeon: Mauri [place], MD;  [facility];  Service: Endoscopy;  Laterality: N/A;  . ESOPHAGOGASTRODUODENOSCOPY    . ESOPHAGOGASTRODUODENOSCOPY N/A 01/22/2017   Procedure: ESOPHAGOGASTRODUODENOSCOPY (EGD);  Surgeon: [name], MD;  [facility];  Service: General;  Laterality: N/A;  . INCISIONAL HERNIA REPAIR  09/04/2011   Procedure: HERNIA REPAIR INCISIONAL;  Surgeon: Madilyn Hook, DO;  Location: [facility];  Service: General;  Laterality: N/A;  debridment calcified mass  . INSERTION OF MESH  09/04/2011   retrorectus ultrapro "30x30cm"  . INSERTION OF MESH N/A 01/22/2017   Procedure: INSERTION OF MESH;  Surgeon: [name], MD;  [facility];  Service: General;  Laterality: N/A;  . NISSEN FUNDOPLICATION     Open [id number]  . POLYPECTOMY    . STOMACH SURGERY  10/05/2010   Oversewing of gastric ulcer.  Dr Brantley Stage  . TOTAL HIP ARTHROPLASTY Left 04/24/2014  Procedure: LEFT TOTAL HIP ARTHROPLASTY;  Surgeon: Marchia Bond, MD;  Location: [place];  Service: Orthopedics;  Laterality: Left;  . UPPER GASTROINTESTINAL ENDOSCOPY      There were no vitals filed for this visit.  Subjective Assessment - 01/25/19 1102    Subjective  "I am doing better in the shoulder, Still N/T in the hands which doesn't go away"    Currently in Pain?  Yes    Pain Score  4     Pain Orientation  Left    Pain Descriptors / Indicators  Aching                       [facility] Adult PT Treatment/Exercise - 01/25/19 0001      Neck Exercises: Machines for Strengthening   UBE (Upper Arm Bike)  L3 x 4 min (2 fwd/2 bwd)      Neck Exercises: Standing   Wall Push Ups  15 reps   demonstration for proper form   Other Standing Exercises  horizontal abduction 2 x 12 with red  theraband      Neck Exercises: Seated   Other Seated Exercise  thoracic extension over back of the chair 2 x 10 holding 5 seconds      Manual Therapy   Manual Therapy  Other (comment)    Manual therapy comments  skilled palpation and monitoring of pt throughout TPDN    Joint Mobilization  Thoracic CPA and L unilateral grade III-IV throughout upper thoracic region     Soft tissue mobilization  IASTM along L Rhomboids, upper trap/ levator scapule    Other Manual Therapy  L upper trap inhibition taping      Neck Exercises: Stretches   Upper Trapezius Stretch  2 reps;30 seconds    Levator Stretch  2 reps;30 seconds    Other Neck Stretches  Rhomboid stretch 2 x 30 sec with hands crossed holding onto Doorknob       Trigger Point Dry Needling - 01/25/19 0001    Consent Given?  Yes    Education Handout Provided  Previously provided    Muscles Treated Head and Neck  Levator scapulae;Splenius capitus    Upper Trapezius Response  Twitch reponse elicited    Levator Scapulae Response  Twitch response elicited;Palpable increased muscle length    Splenius capitus Response  Twitch reponse elicited;Palpable increased muscle length   L only   Rhomboids Response  Twitch response elicited             PT Short Term Goals - 01/05/19 1059      PT SHORT TERM GOAL #1   Title  pt to be I with inital HEP    Time  3    Period  Weeks    Status  New    Target Date  01/26/19      PT SHORT TERM GOAL #2   Title  pt to verbalize/ demo good posture and lifting mechanics to reduce and prevent neck/ shoulder pain and UE N/T    Time  3    Period  Weeks    Status  New    Target Date  01/26/19        PT Long Term Goals - 01/05/19 1059      PT LONG TERM GOAL #1   Title  pt to increase gross cervical ROM by >/= 8 degrees in all planes to maximize overal function with no report of pain    Time  6    Period  Weeks    Status  New    Target Date  02/16/19      PT LONG TERM GOAL #2   Title  pt to  report N/T in bil hands to intermittent to promote improvement in condition and QOL    Time  8    Period  Weeks    Status  New    Target Date  03/02/19      PT LONG TERM GOAL #3   Title  increase gross shoulder strength to >/= 4+/5 with report of L shoulder pain to promote lifting and carrying required for work related activities.    Time  6    Period  Weeks    Status  New    Target Date  02/16/19      PT LONG TERM GOAL #4   Title  pt to be I with all HEP given as of last visit to maintain and progress current level of function    Time  6    Period  Weeks    Status  New    Target Date  02/16/19            Plan - 01/25/19 1143    Clinical Impression Statement  [name] continues to report decreased pain inthe back of the shoulder/ neck, but does report no changes in N/T in the hands. continued TPDN focusing on the L posterior shoulder/ neck followed with IASTM techniques and thoracic mobilizations. He did well with strengthening reporting decreased pain / stiffness in the shoulder. pt declined modalities end of session.    PT Treatment/Interventions  ADLs/Self Care Home Management;Cryotherapy;Electrical Stimulation;Iontophoresis 4mg /ml Dexamethasone;Moist Heat;Traction;Ultrasound;Therapeutic activities;Therapeutic exercise;Neuromuscular re-education;Patient/family education;Manual techniques;Passive range of motion;Dry needling;Taping    PT Next Visit Plan  review/ update HEP, STW along left upper trap and rhomboid region, thoracic mobility, scapular stability/cervical isometrics, posture education.    PT Home Exercise Plan  Seated chin tuck (with protraction), prone I, double ER with yellow, sidelying thoracic rotation open book stretch, self massage with ball against wall    Consulted and Agree with Plan of Care  Patient       Patient will benefit from skilled therapeutic intervention in order to improve the following deficits and impairments:  Improper body mechanics, Increased  muscle spasms, Decreased strength, Pain, Decreased endurance, Decreased mobility, Decreased activity tolerance, Decreased range of motion, Postural dysfunction, Impaired sensation  Visit Diagnosis: Cervicalgia  Abnormal posture  Other disturbances of skin sensation     Problem List Patient Active Problem List   Diagnosis Date Noted  . Hallucinations, visual 01/18/2019  . HLD (hyperlipidemia) 12/21/2018  . Long-term current use of benzodiazepine 12/21/2018  . Ambien use disorder, mild ([place]) 12/21/2018  . Hemiplegia and hemiparesis following cerebral infarction affecting right dominant side ([place]) 12/21/2018  . Myelopathy ([place]) 04/08/2018  . Closed low lateral malleolus fracture 02/01/2018  . Paresthesia of upper and lower extremities of both sides 01/03/2018  . Elevated CK 01/03/2018  . Headache 11/11/2017  . Right sided weakness   . Vertigo   . Stroke ([place]) 10/05/2017  . Leukocytosis 10/05/2017  . Vitamin D deficiency 08/25/2017  . Hyperkalemia 08/25/2017  . Fatigue 11/23/2016  . Gastroesophageal reflux disease   . Recurrent hiatal hernia s/p redo robotic PEH/Nissen repair 01/22/2017   . Elevated blood pressure reading 12/12/2015  . Difficulty concentrating 06/20/2015  . Insomnia 05/30/2014  . Primary localized osteoarthritis of left hip 04/24/2014  . Elevated hemoglobin A1c 11/17/2012  .  Erectile dysfunction 08/08/2012  . History of bleeding gastric ulcer status post repair 2012 (NSAIDs) 05/26/2011  . Anxiety 05/26/2011  . DJD (degenerative joint disease) 10/14/2010   Starr [place] PT, DPT, LAT, ATC  01/25/19  11:48 AM      [place] [facility] [address] [place], [place], [address] Phone: [phone]   Fax:  [phone]  Name: [name] MRN: [id number] Date of Birth: 01-19-1971

## 2019-01-30 ENCOUNTER — Other Ambulatory Visit: Payer: Self-pay

## 2019-01-30 ENCOUNTER — Ambulatory Visit: Payer: No Typology Code available for payment source | Admitting: Physical Therapy

## 2019-01-30 ENCOUNTER — Encounter: Payer: Self-pay | Admitting: Physical Therapy

## 2019-01-30 DIAGNOSIS — M542 Cervicalgia: Secondary | ICD-10-CM

## 2019-01-30 DIAGNOSIS — R293 Abnormal posture: Secondary | ICD-10-CM

## 2019-01-30 DIAGNOSIS — R208 Other disturbances of skin sensation: Secondary | ICD-10-CM

## 2019-01-30 NOTE — Therapy (Signed)
[place], [place], [address] Phone: [phone]   Fax:  [phone]  Physical Therapy Treatment  Patient Details  Name: [name] MRN: [id number] Date of Birth: 12/24/1970 Referring Provider (PT): Clydell Hakim, MD    Encounter Date: 01/30/2019  PT End of Session - 01/30/19 0931    Visit Number  7    Number of Visits  13    Date for PT Re-Evaluation  02/16/19    Authorization Type  [facility] [employer]    PT Start Time  0931    PT Stop Time  1009    PT Time Calculation (min)  38 min    Activity Tolerance  Patient tolerated treatment well       Past Medical History:  Diagnosis Date  . Anxiety    takes Xanax daily prn, panic attacks  . Chronic gastric ulcer with bleeding s/p suture repair 2012 09/29/2010   Head 3 EGD which failed to stop the bleeding. Had exploratory laparotomy on 10/04/2010. Possible cause of the gastric ulcer was NSAID use.   . Constipation    related to pain meds  . Depression   . Dizziness    occasionally  . Dyspnea    walking a bit  . Erosive esophagitis   . External hemorrhoid, bleeding   . Gastric ulcer    history of  . GERD (gastroesophageal reflux disease)    takes Protonix bid  . Hiatal hernia   . History of blood clots 2012   in abdomen  . History of blood transfusion 2012  . History of blood transfusion   . Incisional hernia s/p open repair w mesh Aug 2013 05/26/2011  . Joint pain    knees  . Nocturia    depends on amount of fluid he drinks  . Pneumonia 2018  . Pre-diabetes   . Primary localized osteoarthritis of left hip 04/24/2014  . Serrated polyp of colon   . Sleep apnea    no cpap  mild no cpap needed  . Stroke ([place])   . Upper GI bleed   . Ventral hernia     Past Surgical History:  Procedure Laterality Date  . ANTERIOR CERVICAL DECOMPRESSION/DISCECTOMY FUSION 4 LEVELS N/A 04/08/2018   Procedure: ACDF - C3-C4 - C4-C5 - C5-C6 - C6-C7;  Surgeon: Kary Kos,  MD;  Location: [facility];  Service: Neurosurgery;  Laterality: N/A;  . COLONOSCOPY    . ESOPHAGEAL MANOMETRY N/A 10/19/2016   Procedure: ESOPHAGEAL MANOMETRY (EM);  Surgeon: Mauri Pole, MD;  [facility];  Service: Endoscopy;  Laterality: N/A;  . ESOPHAGOGASTRODUODENOSCOPY    . ESOPHAGOGASTRODUODENOSCOPY N/A 01/22/2017   Procedure: ESOPHAGOGASTRODUODENOSCOPY (EGD);  Surgeon: [name], MD;  Location: [facility];  Service: General;  Laterality: N/A;  . INCISIONAL HERNIA REPAIR  09/04/2011   Procedure: HERNIA REPAIR INCISIONAL;  Surgeon: Madilyn Hook, DO;  Location: [facility];  Service: General;  Laterality: N/A;  debridment calcified mass  . INSERTION OF MESH  09/04/2011   retrorectus ultrapro "30x30cm"  . INSERTION OF MESH N/A 01/22/2017   Procedure: INSERTION OF MESH;  Surgeon: [name], MD;  [facility];  Service: General;  Laterality: N/A;  . NISSEN FUNDOPLICATION     Open [id number]  . POLYPECTOMY    . STOMACH SURGERY  10/05/2010   Oversewing of gastric ulcer.  Dr Brantley Stage  . TOTAL HIP ARTHROPLASTY Left 04/24/2014   Procedure: LEFT TOTAL HIP ARTHROPLASTY;  Surgeon: Marchia Bond, MD;  Location: [facility];  Service: Orthopedics;  Laterality: Left;  . UPPER GASTROINTESTINAL ENDOSCOPY      There were no vitals filed for this visit.  Subjective Assessment - 01/30/19 0931    Subjective  "The dry needling has helped, I am not feeling any pain. i still have some N/T in the hands but they feel better"    Diagnostic tests  11/09/2018 CT scan, 1. No acute abnormality of the cervical spine.2. Status post C3 to C7 ACDF without evidence of hardwarecomplication.3. Moderate left C6-7 neural foraminal stenosis due to uncovertebralhypertrophy.    Currently in Pain?  No/denies    Pain Score  0-No pain    Aggravating Factors   looking at end range to the L/R                       [facility] Adult PT Treatment/Exercise - 01/30/19 0001      Neck Exercises: Machines for  Strengthening   UBE (Upper Arm Bike)  L3 x 6 min    changing direction at 3 min     Neck Exercises: Theraband   Rows  15 reps;Green   x 2 sets cues to keep shoulders down and avoid hiking   Shoulder External Rotation  Green;15 reps    Shoulder Internal Rotation  15 reps;Green      Neck Exercises: Standing   Wall Push Ups  15 reps   x 2 sets   Upper Extremity D2  10 reps    Theraband Level (UE D2)  Level 1 (Yellow)    Other Standing Exercises  lower trap strengtheing with yellow band 2 x 10      Neck Exercises: Stretches   Upper Trapezius Stretch  Left;2 reps;30 seconds    Other Neck Stretches  Rhomboid stretch 2 x 30 sec with hands crossed holding onto Doorknob             PT Education - 01/30/19 0936    Education Details  how to perform MTPR and tools that can assist with treatment    Person(s) Educated  Patient    Methods  Explanation;Verbal cues    Comprehension  Verbalized understanding;Verbal cues required       PT Short Term Goals - 01/05/19 1059      PT SHORT TERM GOAL #1   Title  pt to be I with inital HEP    Time  3    Period  Weeks    Status  New    Target Date  01/26/19      PT SHORT TERM GOAL #2   Title  pt to verbalize/ demo good posture and lifting mechanics to reduce and prevent neck/ shoulder pain and UE N/T    Time  3    Period  Weeks    Status  New    Target Date  01/26/19        PT Long Term Goals - 01/05/19 1059      PT LONG TERM GOAL #1   Title  pt to increase gross cervical ROM by >/= 8 degrees in all planes to maximize overal function with no report of pain    Time  6    Period  Weeks    Status  New    Target Date  02/16/19      PT LONG TERM GOAL #2   Title  pt to report N/T in bil hands to intermittent to promote improvement in condition and QOL  Time  8    Period  Weeks    Status  New    Target Date  03/02/19      PT LONG TERM GOAL #3   Title  increase gross shoulder strength to >/= 4+/5 with report of L shoulder pain  to promote lifting and carrying required for work related activities.    Time  6    Period  Weeks    Status  New    Target Date  02/16/19      PT LONG TERM GOAL #4   Title  pt to be I with all HEP given as of last visit to maintain and progress current level of function    Time  6    Period  Weeks    Status  New    Target Date  02/16/19            Plan - 01/30/19 1007    Clinical Impression Statement  [name] reported no pain today. held off on TPDN focusing on MTPR techniques and educating how to perform at home. focused on shoulder strengthening which he is able to complete but does fatigue quickly. plan to reassess next session and determine if more PT is needed.    PT Treatment/Interventions  ADLs/Self Care Home Management;Cryotherapy;Electrical Stimulation;Iontophoresis 4mg /ml Dexamethasone;Moist Heat;Traction;Ultrasound;Therapeutic activities;Therapeutic exercise;Neuromuscular re-education;Patient/family education;Manual techniques;Passive range of motion;Dry needling;Taping    PT Next Visit Plan  reassess next session STW along left upper trap and rhomboid region, thoracic mobility, scapular stability/cervical isometrics, posture education.    PT Home Exercise Plan  Seated chin tuck (with protraction), prone I, double ER with yellow, sidelying thoracic rotation open book stretch, self massage with ball against wall       Patient will benefit from skilled therapeutic intervention in order to improve the following deficits and impairments:  Improper body mechanics, Increased muscle spasms, Decreased strength, Pain, Decreased endurance, Decreased mobility, Decreased activity tolerance, Decreased range of motion, Postural dysfunction, Impaired sensation  Visit Diagnosis: Cervicalgia  Abnormal posture  Other disturbances of skin sensation     Problem List Patient Active Problem List   Diagnosis Date Noted  . Hallucinations, visual 01/18/2019  . HLD (hyperlipidemia)  12/21/2018  . Long-term current use of benzodiazepine 12/21/2018  . Ambien use disorder, mild ([place]) 12/21/2018  . Hemiplegia and hemiparesis following cerebral infarction affecting right dominant side ([place]) 12/21/2018  . Myelopathy ([place]) 04/08/2018  . Closed low lateral malleolus fracture 02/01/2018  . Paresthesia of upper and lower extremities of both sides 01/03/2018  . Elevated CK 01/03/2018  . Headache 11/11/2017  . Right sided weakness   . Vertigo   . Stroke ([place]) 10/05/2017  . Leukocytosis 10/05/2017  . Vitamin D deficiency 08/25/2017  . Hyperkalemia 08/25/2017  . Fatigue 11/23/2016  . Gastroesophageal reflux disease   . Recurrent hiatal hernia s/p redo robotic PEH/Nissen repair 01/22/2017   . Elevated blood pressure reading 12/12/2015  . Difficulty concentrating 06/20/2015  . Insomnia 05/30/2014  . Primary localized osteoarthritis of left hip 04/24/2014  . Elevated hemoglobin A1c 11/17/2012  . Erectile dysfunction 08/08/2012  . History of bleeding gastric ulcer status post repair 2012 (NSAIDs) 05/26/2011  . Anxiety 05/26/2011  . DJD (degenerative joint disease) 10/14/2010   Starr Lake PT, DPT, LAT, ATC  01/30/19  10:09 AM      [facility] [address] [place], [place], [address] Phone: [phone]   Fax:  [phone]  Name: [name] MRN: [id number] Date of  Birth: 11/15/1970

## 2019-02-01 ENCOUNTER — Encounter: Payer: Self-pay | Admitting: Physical Therapy

## 2019-02-01 ENCOUNTER — Other Ambulatory Visit: Payer: Self-pay

## 2019-02-01 ENCOUNTER — Ambulatory Visit: Payer: No Typology Code available for payment source | Admitting: Physical Therapy

## 2019-02-01 DIAGNOSIS — R293 Abnormal posture: Secondary | ICD-10-CM

## 2019-02-01 DIAGNOSIS — M542 Cervicalgia: Secondary | ICD-10-CM

## 2019-02-01 DIAGNOSIS — R208 Other disturbances of skin sensation: Secondary | ICD-10-CM

## 2019-02-01 NOTE — Therapy (Signed)
[place], [place], [address] Phone: [phone]   Fax:  [phone]  Physical Therapy Treatment  Patient Details  Name: [name] MRN: [id number] Date of Birth: February 23, 1970 Referring Provider (PT): Clydell Hakim, MD    Encounter Date: 02/01/2019  PT End of Session - 02/01/19 1008    Visit Number  8    Number of Visits  13    Date for PT Re-Evaluation  02/16/19    Authorization Type  [facility] [employer]    PT Start Time  0931    PT Stop Time  1020    PT Time Calculation (min)  49 min       Past Medical History:  Diagnosis Date  . Anxiety    takes Xanax daily prn, panic attacks  . Chronic gastric ulcer with bleeding s/p suture repair 2012 09/29/2010   Head 3 EGD which failed to stop the bleeding. Had exploratory laparotomy on 10/04/2010. Possible cause of the gastric ulcer was NSAID use.   . Constipation    related to pain meds  . Depression   . Dizziness    occasionally  . Dyspnea    walking a bit  . Erosive esophagitis   . External hemorrhoid, bleeding   . Gastric ulcer    history of  . GERD (gastroesophageal reflux disease)    takes Protonix bid  . Hiatal hernia   . History of blood clots 2012   in abdomen  . History of blood transfusion 2012  . History of blood transfusion   . Incisional hernia s/p open repair w mesh Aug 2013 05/26/2011  . Joint pain    knees  . Nocturia    depends on amount of fluid he drinks  . Pneumonia 2018  . Pre-diabetes   . Primary localized osteoarthritis of left hip 04/24/2014  . Serrated polyp of colon   . Sleep apnea    no cpap  mild no cpap needed  . Stroke ([place])   . Upper GI bleed   . Ventral hernia     Past Surgical History:  Procedure Laterality Date  . ANTERIOR CERVICAL DECOMPRESSION/DISCECTOMY FUSION 4 LEVELS N/A 04/08/2018   Procedure: ACDF - C3-C4 - C4-C5 - C5-C6 - C6-C7;  Surgeon: Kary Kos, MD;  Location: [facility];  Service: Neurosurgery;  Laterality:  N/A;  . COLONOSCOPY    . ESOPHAGEAL MANOMETRY N/A 10/19/2016   Procedure: ESOPHAGEAL MANOMETRY (EM);  Surgeon: Mauri Pole, MD;  [facility];  Service: Endoscopy;  Laterality: N/A;  . ESOPHAGOGASTRODUODENOSCOPY    . ESOPHAGOGASTRODUODENOSCOPY N/A 01/22/2017   Procedure: ESOPHAGOGASTRODUODENOSCOPY (EGD);  Surgeon: [name], MD;  [facility];  Service: General;  Laterality: N/A;  . INCISIONAL HERNIA REPAIR  09/04/2011   Procedure: HERNIA REPAIR INCISIONAL;  Surgeon: Madilyn Hook, DO;  Location: [facility];  Service: General;  Laterality: N/A;  debridment calcified mass  . INSERTION OF MESH  09/04/2011   retrorectus ultrapro "30x30cm"  . INSERTION OF MESH N/A 01/22/2017   Procedure: INSERTION OF MESH;  Surgeon: [name], MD;  [facility];  Service: General;  Laterality: N/A;  . NISSEN FUNDOPLICATION     Open [id number]  . POLYPECTOMY    . STOMACH SURGERY  10/05/2010   Oversewing of gastric ulcer.  Dr Brantley Stage  . TOTAL HIP ARTHROPLASTY Left 04/24/2014   Procedure: LEFT TOTAL HIP ARTHROPLASTY;  Surgeon: Marchia Bond, MD;  Location: [facility];  Service: Orthopedics;  Laterality: Left;  .  UPPER GASTROINTESTINAL ENDOSCOPY      There were no vitals filed for this visit.  Subjective Assessment - 02/01/19 0932    Subjective  " I don't know if I slept funny or not but I am having more pain in my shoulder this morning at a 5/10"    Currently in Pain?  Yes    Pain Score  5     Pain Location  Shoulder    Pain Orientation  Left    Pain Descriptors / Indicators  Aching    Pain Type  Chronic pain    Pain Onset  More than a month ago    Pain Frequency  Intermittent                       [facility] Adult PT Treatment/Exercise - 02/01/19 0001      Neck Exercises: Machines for Strengthening   UBE (Upper Arm Bike)  --    Nustep  L5 x 24min UE/LE      Neck Exercises: Theraband   Rows  15 reps;Green      Neck Exercises: Supine   Other Supine Exercise  bil shoulder  flexion pushing in on pilaes ring 1 x 10, pushing out on pilates ring 1 x 10    scapular protreaction L only 2 x 15 with 5#    Other Supine Exercise  thoracic exension over bolster 2 x 10, book opening 2 x 10 bil      Manual Therapy   Other Manual Therapy  MTPR along the L rhomboids      Neck Exercises: Stretches   Other Neck Stretches  Rhomboid stretch 2 x 30 sec with hands crossed holding onto Doorknob               PT Short Term Goals - 01/05/19 1059      PT SHORT TERM GOAL #1   Title  pt to be I with inital HEP    Time  3    Period  Weeks    Status  New    Target Date  01/26/19      PT SHORT TERM GOAL #2   Title  pt to verbalize/ demo good posture and lifting mechanics to reduce and prevent neck/ shoulder pain and UE N/T    Time  3    Period  Weeks    Status  New    Target Date  01/26/19        PT Long Term Goals - 01/05/19 1059      PT LONG TERM GOAL #1   Title  pt to increase gross cervical ROM by >/= 8 degrees in all planes to maximize overal function with no report of pain    Time  6    Period  Weeks    Status  New    Target Date  02/16/19      PT LONG TERM GOAL #2   Title  pt to report N/T in bil hands to intermittent to promote improvement in condition and QOL    Time  8    Period  Weeks    Status  New    Target Date  03/02/19      PT LONG TERM GOAL #3   Title  increase gross shoulder strength to >/= 4+/5 with report of L shoulder pain to promote lifting and carrying required for work related activities.    Time  6    Period  Weeks  Status  New    Target Date  02/16/19      PT LONG TERM GOAL #4   Title  pt to be I with all HEP given as of last visit to maintain and progress current level of function    Time  6    Period  Weeks    Status  New    Target Date  02/16/19            Plan - 02/01/19 [id number]    Clinical Impression Statement  pt arrived to PT today reporting increaesd soreness int he l shoulder which he notes could be from  his sleeping position. focused on manual trigger point release techniques and shoulder stretching/ strengthening which he responded well to noting relief of tension. plan to continue with current treatment plan working on finalizing HEP and addressing any remaining deficits.    PT Treatment/Interventions  ADLs/Self Care Home Management;Cryotherapy;Electrical Stimulation;Iontophoresis 4mg /ml Dexamethasone;Moist Heat;Traction;Ultrasound;Therapeutic activities;Therapeutic exercise;Neuromuscular re-education;Patient/family education;Manual techniques;Passive range of motion;Dry needling;Taping    PT Next Visit Plan  reassess next session STW along left upper trap and rhomboid region, thoracic mobility, scapular stability/cervical isometrics, posture education.    PT Home Exercise Plan  Seated chin tuck (with protraction), prone I, double ER with yellow, sidelying thoracic rotation open book stretch, self massage with ball against wall       Patient will benefit from skilled therapeutic intervention in order to improve the following deficits and impairments:  Improper body mechanics, Increased muscle spasms, Decreased strength, Pain, Decreased endurance, Decreased mobility, Decreased activity tolerance, Decreased range of motion, Postural dysfunction, Impaired sensation  Visit Diagnosis: Cervicalgia  Abnormal posture  Other disturbances of skin sensation     Problem List Patient Active Problem List   Diagnosis Date Noted  . Hallucinations, visual 01/18/2019  . HLD (hyperlipidemia) 12/21/2018  . Long-term current use of benzodiazepine 12/21/2018  . Ambien use disorder, mild ([place]) 12/21/2018  . Hemiplegia and hemiparesis following cerebral infarction affecting right dominant side ([place]) 12/21/2018  . Myelopathy ([place]) 04/08/2018  . Closed low lateral malleolus fracture 02/01/2018  . Paresthesia of upper and lower extremities of both sides 01/03/2018  . Elevated CK 01/03/2018  . Headache  11/11/2017  . Right sided weakness   . Vertigo   . Stroke ([place]) 10/05/2017  . Leukocytosis 10/05/2017  . Vitamin D deficiency 08/25/2017  . Hyperkalemia 08/25/2017  . Fatigue 11/23/2016  . Gastroesophageal reflux disease   . Recurrent hiatal hernia s/p redo robotic PEH/Nissen repair 01/22/2017   . Elevated blood pressure reading 12/12/2015  . Difficulty concentrating 06/20/2015  . Insomnia 05/30/2014  . Primary localized osteoarthritis of left hip 04/24/2014  . Elevated hemoglobin A1c 11/17/2012  . Erectile dysfunction 08/08/2012  . History of bleeding gastric ulcer status post repair 2012 (NSAIDs) 05/26/2011  . Anxiety 05/26/2011  . DJD (degenerative joint disease) 10/14/2010   Starr Lake PT, DPT, LAT, ATC  02/01/19  10:10 AM      [facility] [address] [place], [place], [address] Phone: [phone]   Fax:  [phone]  Name: DENNIS SIGNS MRN: [id number] Date of Birth: Sep 26, 1970

## 2019-02-06 ENCOUNTER — Other Ambulatory Visit: Payer: Self-pay | Admitting: Family Medicine

## 2019-02-07 NOTE — Telephone Encounter (Signed)
Last OV 12/22/18 Last fill 12/12/18  #20/1

## 2019-02-09 ENCOUNTER — Telehealth: Payer: No Typology Code available for payment source | Admitting: Emergency Medicine

## 2019-02-09 DIAGNOSIS — R070 Pain in throat: Secondary | ICD-10-CM | POA: Diagnosis not present

## 2019-02-09 NOTE — Progress Notes (Signed)
Time spent: 5 min  We are sorry that you are not feeling well.  Here is how we plan to help!  Your symptoms indicate a likely viral infection (Pharyngitis).   Pharyngitis is inflammation in the back of the throat which can cause a sore throat, scratchiness and sometimes difficulty swallowing.   Pharyngitis is typically caused by a respiratory virus and will just run its course.  Please keep in mind that your symptoms could last up to 10 days.  For throat pain, we recommend over the counter oral pain relief medications such as acetaminophen , or anti-inflammatory medications such as ibuprofen or naproxen sodium.  Do not take aspirin or ibuprofen or naproxen containing products if you have history of gastrointestinal bleeding, severe acid reflux or ulcers. Topical treatments such as oral throat lozenges or sprays may be used as needed.  Avoid close contact with loved ones, especially the very young and elderly.  Remember to wash your hands thoroughly throughout the day as this is the number one way to prevent the spread of infection and wipe down door knobs and counters with disinfectant.  After careful review of your answers, I would not recommend and antibiotic for your condition.  Antibiotics should not be used to treat conditions that we suspect are caused by viruses like the virus that causes the common cold or flu. However, some people can have Strep with atypical symptoms. You may need formal testing in clinic or office to confirm if your symptoms continue or worsen.    Keep in mind that given current pandemic, COVID-19 infection needs to be considered. If you develop any other symptoms of COVID-19 like fever, new congestion or cough, chest pain, shortness of breath, vomiting, diarrhea, abdominal pain, body aches, loss of taste or smell you should consider getting formal COVID-19 testing.   Providers prescribe antibiotics to treat infections caused by bacteria. Antibiotics are very powerful in  treating bacterial infections when they are used properly.  To maintain their effectiveness, they should be used only when necessary.  Overuse of antibiotics has resulted in the development of super bugs that are resistant to treatment!    Home Care:  Only take medications as instructed by your medical team.  Do not drink alcohol while taking these medications.  A steam or ultrasonic humidifier can help congestion.  You can place a towel over your head and breathe in the steam from hot water coming from a faucet.  Avoid close contacts especially the very young and the elderly.  Cover your mouth when you cough or sneeze.  Always remember to wash your hands.  Get Help Right Away If:  You develop worsening fever or throat pain.  You have neck stiffness, inability to move neck or open jaw  You develop lesions in the back of your throat or lips  You develop a severe head ache or visual changes.  Your symptoms persist after you have completed your treatment plan.  Make sure you  Understand these instructions.  Will watch your condition.  Will get help right away if you are not doing well or get worse.  Your e-visit answers were reviewed by a board certified advanced clinical practitioner to complete your personal care plan.  Depending on the condition, your plan could have included both over the counter or prescription medications.  If there is a problem please reply  once you have received a response from your provider.  Your safety is important to [place].  If you have drug  allergies check your prescription carefully.    You can use MyChart to ask questions about todays visit, request a non-urgent call back, or ask for a work or school excuse for 24 hours related to this e-Visit. If it has been greater than 24 hours you will need to follow up with your provider, or enter a new e-Visit to address those concerns.  You will get an e-mail in the next two days asking about your  experience.  I hope that your e-visit has been valuable and will speed your recovery. Thank you for using e-visits.   I hope you feel better,   Carmon Sails, PA-C [place]

## 2019-02-15 ENCOUNTER — Ambulatory Visit: Payer: No Typology Code available for payment source | Attending: Anesthesiology | Admitting: Physical Therapy

## 2019-02-15 ENCOUNTER — Other Ambulatory Visit: Payer: Self-pay

## 2019-02-15 DIAGNOSIS — R293 Abnormal posture: Secondary | ICD-10-CM | POA: Diagnosis present

## 2019-02-15 DIAGNOSIS — R208 Other disturbances of skin sensation: Secondary | ICD-10-CM

## 2019-02-15 DIAGNOSIS — M542 Cervicalgia: Secondary | ICD-10-CM | POA: Insufficient documentation

## 2019-02-15 DIAGNOSIS — R209 Unspecified disturbances of skin sensation: Secondary | ICD-10-CM | POA: Diagnosis present

## 2019-02-15 NOTE — Therapy (Signed)
[place], [place], [address] Phone: [phone]   Fax:  [phone]  Physical Therapy Treatment / Re-certification  Patient Details  Name: [name] MRN: [id number] Date of Birth: 24-May-1970 Referring Provider (PT): Clydell Hakim, MD    Encounter Date: 02/15/2019  PT End of Session - 02/15/19 0849    Visit Number  9    Number of Visits  13    Date for PT Re-Evaluation  03/01/19    Authorization Type  [facility] [employer]    PT Start Time  0849    PT Stop Time  0929    PT Time Calculation (min)  40 min    Activity Tolerance  Patient tolerated treatment well    Behavior During Therapy  [facility] for tasks assessed/performed       Past Medical History:  Diagnosis Date  . Anxiety    takes Xanax daily prn, panic attacks  . Chronic gastric ulcer with bleeding s/p suture repair 2012 09/29/2010   Head 3 EGD which failed to stop the bleeding. Had exploratory laparotomy on 10/04/2010. Possible cause of the gastric ulcer was NSAID use.   . Constipation    related to pain meds  . Depression   . Dizziness    occasionally  . Dyspnea    walking a bit  . Erosive esophagitis   . External hemorrhoid, bleeding   . Gastric ulcer    history of  . GERD (gastroesophageal reflux disease)    takes Protonix bid  . Hiatal hernia   . History of blood clots 2012   in abdomen  . History of blood transfusion 2012  . History of blood transfusion   . Incisional hernia s/p open repair w mesh Aug 2013 05/26/2011  . Joint pain    knees  . Nocturia    depends on amount of fluid he drinks  . Pneumonia 2018  . Pre-diabetes   . Primary localized osteoarthritis of left hip 04/24/2014  . Serrated polyp of colon   . Sleep apnea    no cpap  mild no cpap needed  . Stroke ([place])   . Upper GI bleed   . Ventral hernia     Past Surgical History:  Procedure Laterality Date  . ANTERIOR CERVICAL DECOMPRESSION/DISCECTOMY FUSION 4 LEVELS N/A  04/08/2018   Procedure: ACDF - C3-C4 - C4-C5 - C5-C6 - C6-C7;  Surgeon: Kary Kos, MD;  Location: [place];  Service: Neurosurgery;  Laterality: N/A;  . COLONOSCOPY    . ESOPHAGEAL MANOMETRY N/A 10/19/2016   Procedure: ESOPHAGEAL MANOMETRY (EM);  Surgeon: Mauri Pole, MD;  [facility];  Service: Endoscopy;  Laterality: N/A;  . ESOPHAGOGASTRODUODENOSCOPY    . ESOPHAGOGASTRODUODENOSCOPY N/A 01/22/2017   Procedure: ESOPHAGOGASTRODUODENOSCOPY (EGD);  Surgeon: [name], MD;  [facility];  Service: General;  Laterality: N/A;  . INCISIONAL HERNIA REPAIR  09/04/2011   Procedure: HERNIA REPAIR INCISIONAL;  Surgeon: Madilyn Hook, DO;  Location: [place];  Service: General;  Laterality: N/A;  debridment calcified mass  . INSERTION OF MESH  09/04/2011   retrorectus ultrapro "30x30cm"  . INSERTION OF MESH N/A 01/22/2017   Procedure: INSERTION OF MESH;  Surgeon: [name], MD;  [facility];  Service: General;  Laterality: N/A;  . NISSEN FUNDOPLICATION     Open 1517O  . POLYPECTOMY    . STOMACH SURGERY  10/05/2010   Oversewing of gastric ulcer.  Dr Brantley Stage  . TOTAL HIP ARTHROPLASTY Left 04/24/2014  Procedure: LEFT TOTAL HIP ARTHROPLASTY;  Surgeon: Marchia Bond, MD;  Location: [place];  Service: Orthopedics;  Laterality: Left;  . UPPER GASTROINTESTINAL ENDOSCOPY      There were no vitals filed for this visit.  Subjective Assessment - 02/15/19 0849    Subjective  "I am alittle sore and tight this AM. I still calms down throughout the day but can come back in that same spot mostly at the end of the day"    Diagnostic tests  11/09/2018 CT scan, 1. No acute abnormality of the cervical spine.2. Status post C3 to C7 ACDF without evidence of hardwarecomplication.3. Moderate left C6-7 neural foraminal stenosis due to uncovertebralhypertrophy.    Currently in Pain?  Yes    Pain Score  3     Pain Orientation  Left    Pain Descriptors / Indicators  Tightness    Pain Type  Chronic  pain    Pain Onset  More than a month ago    Pain Frequency  Intermittent    Aggravating Factors   getting up inthe AM,    Pain Relieving Factors  heating pad, exercise         [facility] PT Assessment - 02/15/19 0001      Assessment   Medical Diagnosis   Postlaminectomy syndrome, not elsewhere classified     Referring Provider (PT)  Clydell Hakim, MD       AROM   Cervical Flexion  50    Cervical Extension  30    Cervical - Right Side Bend  18    Cervical - Left Side Bend  18    Cervical - Right Rotation  44    Cervical - Left Rotation  57      Strength   Left Shoulder Flexion  4+/5    Left Shoulder Extension  5/5    Left Shoulder ABduction  4/5    Left Shoulder Internal Rotation  4+/5    Left Shoulder External Rotation  4+/5                   [facility] Adult PT Treatment/Exercise - 02/15/19 0001      Neck Exercises: Machines for Strengthening   UBE (Upper Arm Bike)  L5 x 5 min backward only      Neck Exercises: Standing   Other Standing Exercises  lower trap strengtheing with yellow band 2 x 10      Neck Exercises: Prone   Other Prone Exercise  I's, T's y's 1 x 20 each direction      Manual Therapy   Manual therapy comments  skilled palpation and monitoring of pt throughout TPDN    Joint Mobilization  Thoracic CPA and L unilateral grade III-IV throughout upper thoracic region     Soft tissue mobilization  IASTM along L Rhomboids, upper trap/ levator scapule      Neck Exercises: Stretches   Other Neck Stretches  Rhomboid stretch 2 x 30 sec with hands crossed holding onto Doorknob       Trigger Point Dry Needling - 02/15/19 0001    Consent Given?  Yes    Education Handout Provided  Previously provided    Upper Trapezius Response  Twitch reponse elicited    Levator Scapulae Response  Twitch response elicited;Palpable increased muscle length    Rhomboids Response  Twitch response elicited;Palpable increased muscle length           PT Education -  02/15/19 0919    Education Details  reviewed prevoius HEP discussed importance of increasing reps to promote endurance. POC moving forward with the last 2 visits.    Person(s) Educated  Patient    Methods  Explanation;Verbal cues    Comprehension  Verbalized understanding;Verbal cues required       PT Short Term Goals - 02/15/19 0853      PT SHORT TERM GOAL #1   Title  pt to be I with inital HEP    Period  Weeks    Status  Achieved      PT SHORT TERM GOAL #2   Title  pt to verbalize/ demo good posture and lifting mechanics to reduce and prevent neck/ shoulder pain and UE N/T    Period  Weeks    Status  Achieved        PT Long Term Goals - 02/15/19 0854      PT LONG TERM GOAL #1   Title  pt to increase gross cervical ROM by >/= 8 degrees in all planes to maximize overal function with no report of pain    Time  6    Status  Achieved      PT LONG TERM GOAL #2   Title  pt to report N/T in bil hands to intermittent to promote improvement in condition and QOL    Baseline  starting to improve    Period  Weeks    Status  On-going      PT LONG TERM GOAL #3   Title  increase gross shoulder strength to >/= 4+/5 with report of L shoulder pain to promote lifting and carrying required for work related activities.    Baseline  abduction 4/5    Period  Weeks    Status  Partially Met      PT LONG TERM GOAL #4   Title  pt to be I with all HEP given as of last visit to maintain and progress current level of function    Baseline  progress as able.    Period  Weeks    Status  On-going      PT LONG TERM GOAL #5   Title  -            Plan - 02/15/19 0915    Clinical Impression Statement  [name] continues to make progress with physical therapy increasing cervical ROM and additionaly notes decreased pain inthe L posterior shoulder. he is making appropriate progress toward his goals. Continued Working on [employer] focusing on posterior shoulder musculature, He was able to do all exercise  with no report of pain and noted reduced tightness. plan to continue with current POC finalizing HEP and working toward discharge.    PT Frequency  1x / week    PT Duration  2 weeks    PT Treatment/Interventions  ADLs/Self Care Home Management;Cryotherapy;Electrical Stimulation;Iontophoresis '4mg'$ /ml Dexamethasone;Moist Heat;Traction;Ultrasound;Therapeutic activities;Therapeutic exercise;Neuromuscular re-education;Patient/family education;Manual techniques;Passive range of motion;Dry needling;Taping    PT Next Visit Plan  STW along left upper trap and rhomboid region, thoracic mobility, scapular stability/cervical isometrics, posture education. progressive HEP, DN PRN    PT Home Exercise Plan  Seated chin tuck (with protraction), prone I, double ER with yellow, sidelying thoracic rotation open book stretch, self massage with ball against wall    Consulted and Agree with Plan of Care  Patient       Patient will benefit from skilled therapeutic intervention in order to improve the following deficits and impairments:  Improper body mechanics, Increased muscle spasms, Decreased strength,  Pain, Decreased endurance, Decreased mobility, Decreased activity tolerance, Decreased range of motion, Postural dysfunction, Impaired sensation  Visit Diagnosis: Cervicalgia - Plan: PT plan of care cert/re-cert  Abnormal posture - Plan: PT plan of care cert/re-cert  Other disturbances of skin sensation - Plan: PT plan of care cert/re-cert     Problem List Patient Active Problem List   Diagnosis Date Noted  . Hallucinations, visual 01/18/2019  . HLD (hyperlipidemia) 12/21/2018  . Long-term current use of benzodiazepine 12/21/2018  . Ambien use disorder, mild ([place]) 12/21/2018  . Hemiplegia and hemiparesis following cerebral infarction affecting right dominant side ([place]) 12/21/2018  . Myelopathy ([place]) 04/08/2018  . Closed low lateral malleolus fracture 02/01/2018  . Paresthesia of upper and lower extremities  of both sides 01/03/2018  . Elevated CK 01/03/2018  . Headache 11/11/2017  . Right sided weakness   . Vertigo   . Stroke ([place]) 10/05/2017  . Leukocytosis 10/05/2017  . Vitamin D deficiency 08/25/2017  . Hyperkalemia 08/25/2017  . Fatigue 11/23/2016  . Gastroesophageal reflux disease   . Recurrent hiatal hernia s/p redo robotic PEH/Nissen repair 01/22/2017   . Elevated blood pressure reading 12/12/2015  . Difficulty concentrating 06/20/2015  . Insomnia 05/30/2014  . Primary localized osteoarthritis of left hip 04/24/2014  . Elevated hemoglobin A1c 11/17/2012  . Erectile dysfunction 08/08/2012  . History of bleeding gastric ulcer status post repair 2012 (NSAIDs) 05/26/2011  . Anxiety 05/26/2011  . DJD (degenerative joint disease) 10/14/2010    Starr Lake PT, DPT, LAT, ATC  02/15/19  9:28 AM      [facility] [address] [place], [place], [address] Phone: [phone]   Fax:  [phone]  Name: [name] MRN: [id number] Date of Birth: 01/18/1971

## 2019-02-22 ENCOUNTER — Ambulatory Visit: Payer: No Typology Code available for payment source | Admitting: Physical Therapy

## 2019-03-01 ENCOUNTER — Ambulatory Visit: Payer: No Typology Code available for payment source | Admitting: Physical Therapy

## 2019-03-01 ENCOUNTER — Encounter: Payer: Self-pay | Admitting: Physical Therapy

## 2019-03-01 ENCOUNTER — Other Ambulatory Visit: Payer: Self-pay

## 2019-03-01 DIAGNOSIS — R208 Other disturbances of skin sensation: Secondary | ICD-10-CM

## 2019-03-01 DIAGNOSIS — R293 Abnormal posture: Secondary | ICD-10-CM

## 2019-03-01 DIAGNOSIS — M542 Cervicalgia: Secondary | ICD-10-CM

## 2019-03-01 NOTE — Therapy (Signed)
[place], [place], [address] Phone: [phone]   Fax:  [phone]  Physical Therapy Treatment / Discharge  Patient Details  Name: [name] MRN: [id number] Date of Birth: [birth year] Referring Provider (PT): Clydell Hakim, MD    Encounter Date: 03/01/2019  PT End of Session - 03/01/19 0848    Visit Number  10    Number of Visits  13    Date for PT Re-Evaluation  03/01/19    Authorization Type  [facility] [employer]    PT Start Time  0847    PT Stop Time  0920    PT Time Calculation (min)  33 min       Past Medical History:  Diagnosis Date  . Anxiety    takes Xanax daily prn, panic attacks  . Chronic gastric ulcer with bleeding s/p suture repair 2012 09/29/2010   Head 3 EGD which failed to stop the bleeding. Had exploratory laparotomy on 10/04/2010. Possible cause of the gastric ulcer was NSAID use.   . Constipation    related to pain meds  . Depression   . Dizziness    occasionally  . Dyspnea    walking a bit  . Erosive esophagitis   . External hemorrhoid, bleeding   . Gastric ulcer    history of  . GERD (gastroesophageal reflux disease)    takes Protonix bid  . Hiatal hernia   . History of blood clots 2012   in abdomen  . History of blood transfusion 2012  . History of blood transfusion   . Incisional hernia s/p open repair w mesh Aug 2013 05/26/2011  . Joint pain    knees  . Nocturia    depends on amount of fluid he drinks  . Pneumonia 2018  . Pre-diabetes   . Primary localized osteoarthritis of left hip 04/24/2014  . Serrated polyp of colon   . Sleep apnea    no cpap  mild no cpap needed  . Stroke ([place])   . Upper GI bleed   . Ventral hernia     Past Surgical History:  Procedure Laterality Date  . ANTERIOR CERVICAL DECOMPRESSION/DISCECTOMY FUSION 4 LEVELS N/A 04/08/2018   Procedure: ACDF - C3-C4 - C4-C5 - C5-C6 - C6-C7;  Surgeon: Kary Kos, MD;  Location: [place];  Service: Neurosurgery;   Laterality: N/A;  . COLONOSCOPY    . ESOPHAGEAL MANOMETRY N/A 10/19/2016   Procedure: ESOPHAGEAL MANOMETRY (EM);  Surgeon: Mauri Pole, MD;  [facility];  Service: Endoscopy;  Laterality: N/A;  . ESOPHAGOGASTRODUODENOSCOPY    . ESOPHAGOGASTRODUODENOSCOPY N/A 01/22/2017   Procedure: ESOPHAGOGASTRODUODENOSCOPY (EGD);  Surgeon: [name], MD;  [facility];  Service: General;  Laterality: N/A;  . INCISIONAL HERNIA REPAIR  09/04/2011   Procedure: HERNIA REPAIR INCISIONAL;  Surgeon: Madilyn Hook, DO;  Location: [place];  Service: General;  Laterality: N/A;  debridment calcified mass  . INSERTION OF MESH  09/04/2011   retrorectus ultrapro "30x30cm"  . INSERTION OF MESH N/A 01/22/2017   Procedure: INSERTION OF MESH;  Surgeon: [name], MD;  [facility];  Service: General;  Laterality: N/A;  . NISSEN FUNDOPLICATION     Open 0350K  . POLYPECTOMY    . STOMACH SURGERY  10/05/2010   Oversewing of gastric ulcer.  Dr Brantley Stage  . TOTAL HIP ARTHROPLASTY Left 04/24/2014   Procedure: LEFT TOTAL HIP ARTHROPLASTY;  Surgeon: Marchia Bond, MD;  Location: [place];  Service: Orthopedics;  Laterality:  Left;  . UPPER GASTROINTESTINAL ENDOSCOPY      There were no vitals filed for this visit.  Subjective Assessment - 03/01/19 0848    Subjective  "I am doing pretty good, I still get the N/T in the hands"    Currently in Pain?  Yes    Pain Score  1     Pain Orientation  Left    Pain Descriptors / Indicators  Aching    Pain Type  Chronic pain    Pain Onset  More than a month ago    Pain Frequency  Intermittent    Aggravating Factors   prolonged sitting/ standing.    Pain Relieving Factors  heating pad, exercise         [facility] PT Assessment - 03/01/19 0001      Assessment   Medical Diagnosis   Postlaminectomy syndrome, not elsewhere classified     Referring Provider (PT)  Clydell Hakim, MD       Observation/Other Assessments   Focus on Therapeutic Outcomes (FOTO)   not set  up in [place] previously      Strength   Left Shoulder Flexion  4+/5    Left Shoulder Extension  5/5    Left Shoulder ABduction  4/5    Left Shoulder Internal Rotation  4+/5    Left Shoulder External Rotation  4+/5                   [facility] Adult PT Treatment/Exercise - 03/01/19 0854      Manual Therapy   Manual therapy comments  skilled palpation and monitoring of pt throughout TPDN    Joint Mobilization  Thoracic CPA and L unilateral grade III-IV throughout upper thoracic region     Soft tissue mobilization  IASTM along L Rhomboids, upper trap/ levator scapule      Neck Exercises: Stretches   Other Neck Stretches  Rhomboid stretch 2 x 30 sec with hands crossed holding onto Doorknob       Trigger Point Dry Needling - 03/01/19 0001    Upper Trapezius Response  Twitch reponse elicited    Levator Scapulae Response  Twitch response elicited;Palpable increased muscle length    Rhomboids Response  Twitch response elicited;Palpable increased muscle length           PT Education - 03/01/19 0907    Education Details  reviewed previous HEP and importance of consistency with strengtehing with increased reps/ sets to maximize benefits/ endurnace.    Person(s) Educated  Patient    Methods  Explanation;Verbal cues    Comprehension  Verbalized understanding;Verbal cues required       PT Short Term Goals - 02/15/19 0853      PT SHORT TERM GOAL #1   Title  pt to be I with inital HEP    Period  Weeks    Status  Achieved      PT SHORT TERM GOAL #2   Title  pt to verbalize/ demo good posture and lifting mechanics to reduce and prevent neck/ shoulder pain and UE N/T    Period  Weeks    Status  Achieved        PT Long Term Goals - 03/01/19 0908      PT LONG TERM GOAL #1   Title  pt to increase gross cervical ROM by >/= 8 degrees in all planes to maximize overal function with no report of pain    Period  Weeks    Status  Achieved      PT LONG TERM GOAL #2   Title  pt  to report N/T in bil hands to intermittent to promote improvement in condition and QOL    Baseline  improving in the morningbut returns while at work    Period  Weeks    Status  Partially [place] #3   Title  increase gross shoulder strength to >/= 4+/5 with report of L shoulder pain to promote lifting and carrying required for work related activities.    Period  Weeks    Status  Partially Met      PT LONG TERM GOAL #4   Title  pt to be I with all HEP given as of last visit to maintain and progress current level of function    Period  Weeks    Status  Achieved            Plan - 03/01/19 8527    Clinical Impression Statement  [name] has made great progress with physical therapy increasing cervical ROM and shoulder strengthening. continued TPDN along posterior shoulder structures followed with IASTM. He was able to do all exercise well. He met or partially met all goals today and is able to maintain and progress current level of function and will be discharged from PT today.    PT Next Visit Plan  D/C    PT Home Exercise Plan  Seated chin tuck (with protraction), prone I, double ER with yellow, sidelying thoracic rotation open book stretch, self massage with ball against wall       Patient will benefit from skilled therapeutic intervention in order to improve the following deficits and impairments:  Improper body mechanics, Increased muscle spasms, Decreased strength, Pain, Decreased endurance, Decreased mobility, Decreased activity tolerance, Decreased range of motion, Postural dysfunction, Impaired sensation  Visit Diagnosis: Cervicalgia  Abnormal posture  Other disturbances of skin sensation     Problem List Patient Active Problem List   Diagnosis Date Noted  . Hallucinations, visual 01/18/2019  . HLD (hyperlipidemia) 12/21/2018  . Long-term current use of benzodiazepine 12/21/2018  . Ambien use disorder, mild ([place]) 12/21/2018  . Hemiplegia and  hemiparesis following cerebral infarction affecting right dominant side ([place]) 12/21/2018  . Myelopathy ([place]) 04/08/2018  . Closed low lateral malleolus fracture 02/01/2018  . Paresthesia of upper and lower extremities of both sides 01/03/2018  . Elevated CK 01/03/2018  . Headache 11/11/2017  . Right sided weakness   . Vertigo   . Stroke ([place]) 10/05/2017  . Leukocytosis 10/05/2017  . Vitamin D deficiency 08/25/2017  . Hyperkalemia 08/25/2017  . Fatigue 11/23/2016  . Gastroesophageal reflux disease   . Recurrent hiatal hernia s/p redo robotic PEH/Nissen repair 01/22/2017   . Elevated blood pressure reading 12/12/2015  . Difficulty concentrating 06/20/2015  . Insomnia 05/30/2014  . Primary localized osteoarthritis of left hip 04/24/2014  . Elevated hemoglobin A1c 11/17/2012  . Erectile dysfunction 08/08/2012  . History of bleeding gastric ulcer status post repair 2012 (NSAIDs) 05/26/2011  . Anxiety 05/26/2011  . DJD (degenerative joint disease) 10/14/2010    Starr Lake 03/01/2019, 9:28 AM  [facility] [address] [place], [place], [address] Phone: [phone]   Fax:  [phone]  Name: [name] MRN: [id number] Date of Birth: [birth year]      PHYSICAL THERAPY DISCHARGE SUMMARY  Visits from Start of Care: 10  Current functional level related to goals / functional outcomes:  See goals   Remaining deficits: Continued N/T into bil hands. Intermittent stiffness in L posterior shoulder musculature that is relieved with stretching and exercise   Education / Equipment: HEP, theraband, posture / lifting mechanics,   Plan: Patient agrees to discharge.  Patient goals were partially met. Patient is being discharged due to being pleased with the current functional level.  ?????          Tecla Mailloux PT, DPT, LAT, ATC  03/01/19  9:29 AM

## 2019-03-09 ENCOUNTER — Telehealth: Payer: No Typology Code available for payment source | Admitting: Physician Assistant

## 2019-03-09 DIAGNOSIS — H60332 Swimmer's ear, left ear: Secondary | ICD-10-CM

## 2019-03-09 MED ORDER — NEOMYCIN-POLYMYXIN-HC 1 % OT SOLN: 3.0000 [drp] | Freq: Three times a day (TID) | OTIC | 0 refills | Status: DC

## 2019-03-09 MED ORDER — SULFAMETHOXAZOLE-TRIMETHOPRIM 800-160 MG PO TABS: 1.0000 | ORAL_TABLET | Freq: Two times a day (BID) | ORAL | 0 refills | Status: DC

## 2019-03-09 NOTE — Progress Notes (Signed)
E Visit for Swimmer's Ear  We are sorry that you are not feeling well. Here is how we plan to help!  I have prescribed: Neomycin 0.35%, polymyxin B 10,000 units/mL, and hydrocortisone 0,5% otic solution 4 drops in affected ears four times a day for 7 days  Bactrim 800mg /160mg  one tablet by mouth twice a day for 10 days  In certain cases swimmer's ear may progress to a more serious bacterial infection of the middle or inner ear.  If you have a fever 102 and up and significantly worsening symptoms, this could indicate a more serious infection moving to the middle/inner and needs face to face evaluation in an office by a provider.  Your symptoms should improve over the next 3 days and should resolve in about 7 days.  HOME CARE:   Wash your hands frequently.  Do not place the tip of the bottle on your ear or touch it with your fingers.  You can take Acetominophen 650 mg every 4-6 hours as needed for pain.  If pain is severe or moderate, you can apply a heating pad (set on low) or hot water bottle (wrapped in a towel) to outer ear for 20 minutes.  This will also increase drainage.  Avoid ear plugs  Do not use Q-tips  After showers, help the water run out by tilting your head to one side.  GET HELP RIGHT AWAY IF:   Fever is over 102.2 degrees.  You develop progressive ear pain or hearing loss.  Ear symptoms persist longer than 3 days after treatment.  MAKE SURE YOU:   Understand these instructions.  Will watch your condition.  Will get help right away if you are not doing well or get worse.  TO PREVENT SWIMMER'S EAR:  Use a bathing cap or custom fitted swim molds to keep your ears dry.  Towel off after swimming to dry your ears.  Tilt your head or pull your earlobes to allow the water to escape your ear canal.  If there is still water in your ears, consider using a hairdryer on the lowest setting.  Thank you for choosing an e-visit. Your e-visit answers were reviewed  by a board certified advanced clinical practitioner to complete your personal care plan. Depending upon the condition, your plan could have included both over the counter or prescription medications. Please review your pharmacy choice. Be sure that the pharmacy you have chosen is open so that you can pick up your prescription now.  If there is a problem you may message your provider in [place] to have the prescription routed to another pharmacy. Your safety is important to [place]. If you have drug allergies check your prescription carefully.  For the next 24 hours, you can use MyChart to ask questions about today's visit, request a non-urgent call back, or ask for a work or school excuse from your e-visit provider. You will get an email in the next two days asking about your experience. I hope that your e-visit has been valuable and will speed your recovery.   Particia Nearing PA-C  Approximately 5 minutes was spent documenting and reviewing patient's chart.

## 2019-03-15 ENCOUNTER — Other Ambulatory Visit: Payer: Self-pay | Admitting: Family Medicine

## 2019-03-15 DIAGNOSIS — K219 Gastro-esophageal reflux disease without esophagitis: Secondary | ICD-10-CM

## 2019-04-03 ENCOUNTER — Other Ambulatory Visit: Payer: Self-pay | Admitting: Neurology

## 2019-04-10 ENCOUNTER — Telehealth: Payer: Self-pay | Admitting: General Practice

## 2019-04-10 NOTE — Telephone Encounter (Signed)
Patient is calling and requesting a refill for Ambien sent to [facility]. Pt has a TOC appointment with Baldo Ash on 3/23. CB is [phone]

## 2019-04-10 NOTE — Telephone Encounter (Signed)
[name] please advise 

## 2019-04-11 NOTE — Telephone Encounter (Signed)
With current use of klonopin and hydrocodone, I will not be refilling ambien. I am glad to enter referral to sleep clinic for additional evaluation or discuss other medications during his upcoming appt.

## 2019-04-11 NOTE — Telephone Encounter (Signed)
Pt was called and notified of message below. Pt stated he went to a sleep clinic a couple years ago and didn't find it to be reasonable and pt said he was cancelling the appointment an hung up.

## 2019-04-12 ENCOUNTER — Encounter: Payer: Self-pay | Admitting: Internal Medicine

## 2019-04-13 ENCOUNTER — Ambulatory Visit (INDEPENDENT_AMBULATORY_CARE_PROVIDER_SITE_OTHER): Payer: No Typology Code available for payment source | Admitting: Internal Medicine

## 2019-04-13 ENCOUNTER — Encounter: Payer: Self-pay | Admitting: Internal Medicine

## 2019-04-13 DIAGNOSIS — G5603 Carpal tunnel syndrome, bilateral upper limbs: Secondary | ICD-10-CM | POA: Diagnosis not present

## 2019-04-13 DIAGNOSIS — M8949 Other hypertrophic osteoarthropathy, multiple sites: Secondary | ICD-10-CM

## 2019-04-13 DIAGNOSIS — F5101 Primary insomnia: Secondary | ICD-10-CM

## 2019-04-13 DIAGNOSIS — M159 Polyosteoarthritis, unspecified: Secondary | ICD-10-CM

## 2019-04-13 MED ORDER — ZOLPIDEM TARTRATE ER 12.5 MG PO TBCR: 12.5000 mg | EXTENDED_RELEASE_TABLET | Freq: Every evening | ORAL | 0 refills | Status: DC | PRN

## 2019-04-13 NOTE — Progress Notes (Signed)
Virtual Visit via Video Note  I connected with [name] on 04/13/19 at  9:15 AM EST by a video enabled telemedicine application and verified that I am speaking with the correct person using two identifiers.  Location: Patient: Work Provider: Office   I discussed the limitations of evaluation and management by telemedicine and the availability of in person appointments. The patient expressed understanding and agreed to proceed.  History of Present Illness:  Pt requesting a refill of Ambien. He has difficulty falling asleep, staying asleep secondary to neck pain, numbness and tingling in his hands. H He takes this nightly with good results. Sleep study from 08/2014 reviewed. He had cervical spine surgery last year, needs referral to Dr. Saintclair Halsted, neurosurgery for bilateral carpal tunnel syndrome.  He reports he has already been evaluated for this but needs referral for insurance purposes.   Past Medical History:  Diagnosis Date  . Anxiety    takes Xanax daily prn, panic attacks  . Chronic gastric ulcer with bleeding s/p suture repair 2012 09/29/2010   Head 3 EGD which failed to stop the bleeding. Had exploratory laparotomy on 10/04/2010. Possible cause of the gastric ulcer was NSAID use.   . Constipation    related to pain meds  . Depression   . Dizziness    occasionally  . Dyspnea    walking a bit  . Erosive esophagitis   . External hemorrhoid, bleeding   . Gastric ulcer    history of  . GERD (gastroesophageal reflux disease)    takes Protonix bid  . Hiatal hernia   . History of blood clots 2012   in abdomen  . History of blood transfusion 2012  . History of blood transfusion   . Incisional hernia s/p open repair w mesh Aug 2013 05/26/2011  . Joint pain    knees  . Nocturia    depends on amount of fluid he drinks  . Pneumonia 2018  . Pre-diabetes   . Primary localized osteoarthritis of left hip 04/24/2014  . Serrated polyp of colon   . Sleep apnea    no cpap  mild no cpap  needed  . Stroke ([place])   . Upper GI bleed   . Ventral hernia     Current Outpatient Medications  Medication Sig Dispense Refill  . acetaminophen (TYLENOL) 500 MG tablet Take 1,000 mg by mouth every 6 (six) hours as needed for mild pain or headache.    Marland Kitchen aspirin EC 81 MG EC tablet Take 1 tablet (81 mg total) by mouth daily.    . clonazePAM (KLONOPIN) 0.5 MG tablet Take 1 tablet (0.5 mg total) by mouth at bedtime. 30 tablet 5  . cyclobenzaprine (FLEXERIL) 10 MG tablet Take 1 tablet (10 mg total) by mouth 3 (three) times daily as needed for muscle spasms. 30 tablet 0  . fluticasone (FLONASE) 50 MCG/ACT nasal spray Place 2 sprays into both nostrils daily as needed for allergies. 16 g 11  . gabapentin (NEURONTIN) 300 MG capsule Take 1 capsule (300 mg total) by mouth 3 (three) times daily. 90 capsule 3  . HYDROcodone-acetaminophen (NORCO) 10-325 MG tablet Take 1 tablet by mouth every 6 (six) hours as needed. 30 tablet 0  . NEOMYCIN-POLYMYXIN-HYDROCORTISONE (CORTISPORIN) 1 % SOLN OTIC solution Place 3 drops into the left ear every 8 (eight) hours. 10 mL 0  . nortriptyline (PAMELOR) 25 MG capsule TAKE 1 CAPSULE BY MOUTH AT BEDTIME 30 capsule 3  . ondansetron (ZOFRAN) 4 MG tablet TAKE  1 TABLET BY MOUTH EVERY 8 HOURS AS NEEDED FOR NAUSEA OR VOMITING. 20 tablet 1  . pantoprazole (PROTONIX) 40 MG tablet TAKE 1 TABLET (40 MG TOTAL) BY MOUTH 2 (TWO) TIMES DAILY. 180 tablet 1  . simvastatin (ZOCOR) 10 MG tablet Take 1 tablet (10 mg total) by mouth daily. 90 tablet 3  . sulfamethoxazole-trimethoprim (BACTRIM DS) 800-160 MG tablet Take 1 tablet by mouth 2 (two) times daily. 20 tablet 0  . Vitamin D, Ergocalciferol, (DRISDOL) 1.25 MG (50000 UT) CAPS capsule Take 1 capsule (50,000 Units total) by mouth every 7 (seven) days. On Monday 12 capsule 0  . zolpidem (AMBIEN CR) 12.5 MG CR tablet Take 1 tablet (12.5 mg total) by mouth at bedtime as needed. for sleep 30 tablet 1   No current facility-administered  medications for this visit.    Allergies  Allergen Reactions  . Dilaudid [Hydromorphone Hcl] Rash and Other (See Comments)    Shakey  . Metoclopramide Hcl Hives, Swelling, Rash and Other (See Comments)    Swelling in lips  . Nsaids     H/o bleeding gastric ulcers  . Benadryl [Diphenhydramine]     Causes patient to become hyper and pace    Family History  Problem Relation Age of Onset  . Diabetes Mother   . Liver disease Mother   . Diabetes Father   . Heart disease Father   . Healthy Sister   . Healthy Brother   . Healthy Brother   . Colon cancer Neg Hx   . Stomach cancer Neg Hx   . Rectal cancer Neg Hx   . Esophageal cancer Neg Hx   . Colon polyps Neg Hx     Social History   Socioeconomic History  . Marital status: Single    Spouse name: Not on file  . Number of children: 1  . Years of education: 9  . Highest education level: High school graduate  Occupational History  . Occupation: Maintenance - [employer]  Tobacco Use  . Smoking status: Never Smoker  . Smokeless tobacco: Current User    Types: Chew  . Tobacco comment: occ-Chew  Substance and Sexual Activity  . Alcohol use: Yes    Alcohol/week: 0.0 standard drinks    Comment: 09/04/11 "very seldom"  . Drug use: No  . Sexual activity: Yes  Other Topics Concern  . Not on file  Social History Narrative   Lives with brother in a one story home.  Has one daughter.  Works in Theatre manager for [employer].  Education: high school.       Patient is left-handed.   Social Determinants of Health   Financial Resource Strain:   . Difficulty of Paying Living Expenses:   Food Insecurity:   . Worried About Charity fundraiser in the Last Year:   . Arboriculturist in the Last Year:   Transportation Needs:   . Film/video editor (Medical):   Marland Kitchen Lack of Transportation (Non-Medical):   Physical Activity:   . Days of Exercise per Week:   . Minutes of Exercise per Session:   Stress:   . Feeling of Stress :    Social Connections:   . Frequency of Communication with Friends and Family:   . Frequency of Social Gatherings with Friends and Family:   . Attends Religious Services:   . Active Member of Clubs or Organizations:   . Attends Archivist Meetings:   Marland Kitchen Marital Status:   Intimate Production manager  Violence:   . Fear of Current or Ex-Partner:   . Emotionally Abused:   Marland Kitchen Physically Abused:   . Sexually Abused:      Constitutional: Denies fever, malaise, fatigue, headache or abrupt weight changes.  Respiratory: Denies difficulty breathing, shortness of breath, cough or sputum production.   Cardiovascular: Denies chest pain, chest tightness, palpitations or swelling in the hands or feet.  Musculoskeletal: Pt reports chronic neck pain. Denies decrease in range of motion, difficulty with gait, muscle pain or joint swelling.  Neurological: Pt reports insomnia, paresthesia of bilateral hands. Denies dizziness, difficulty with memory, difficulty with speech or problems with balance and coordination.    No other specific complaints in a complete review of systems (except as listed in HPI above).    Observations/Objective:   Wt Readings from Last 3 Encounters:  01/18/19 150 lb (68 kg)  08/15/18 210 lb (95.3 kg)  04/08/18 207 lb 3.7 oz (94 kg)    General: Appears his stated age, well developed, well nourished in NAD. Pulmonary/Chest: Normal effort. No respiratory distress.  Neurological: Alert and oriented.   BMET    Component Value Date/Time   NA 136 01/18/2019 0134   K 4.3 01/18/2019 0134   CL 103 01/18/2019 0134   CO2 23 01/18/2019 0134   GLUCOSE 110 (H) 01/18/2019 0134   BUN 17 01/18/2019 0134   CREATININE 1.03 01/18/2019 0134   CALCIUM 8.9 01/18/2019 0134   GFRNONAA >60 01/18/2019 0134   GFRAA >60 01/18/2019 0134    Lipid Panel     Component Value Date/Time   CHOL 170 12/22/2018 1149   TRIG 115.0 12/22/2018 1149   HDL 59.50 12/22/2018 1149   CHOLHDL 3 12/22/2018 1149    VLDL 23.0 12/22/2018 1149   LDLCALC 88 12/22/2018 1149    CBC    Component Value Date/Time   WBC 11.9 (H) 01/18/2019 0045   RBC 4.42 01/18/2019 0045   HGB 13.5 01/18/2019 0045   HCT 40.4 01/18/2019 0045   PLT 297 01/18/2019 0045   MCV 91.4 01/18/2019 0045   MCH 30.5 01/18/2019 0045   MCHC 33.4 01/18/2019 0045   RDW 13.0 01/18/2019 0045   LYMPHSABS 2.6 01/18/2019 0045   MONOABS 1.0 01/18/2019 0045   EOSABS 0.2 01/18/2019 0045   BASOSABS 0.1 01/18/2019 0045    Hgb A1C Lab Results  Component Value Date   HGBA1C 5.9 (H) 10/06/2017        Assessment and Plan:  Insomnia:  Ambien refilled today  Bilateral Carpal Tunnel:  Referral to Dr. Saintclair Halsted placed per pt request  Return precautions discussed  Follow Up Instructions:    I discussed the assessment and treatment plan with the patient. The patient was provided an opportunity to ask questions and all were answered. The patient agreed with the plan and demonstrated an understanding of the instructions.   The patient was advised to call back or seek an in-person evaluation if the symptoms worsen or if the condition fails to improve as anticipated.    Webb Silversmith, NP

## 2019-04-13 NOTE — Patient Instructions (Signed)

## 2019-04-17 ENCOUNTER — Other Ambulatory Visit: Payer: Self-pay | Admitting: Internal Medicine

## 2019-04-18 ENCOUNTER — Encounter: Payer: Self-pay | Admitting: Internal Medicine

## 2019-04-18 NOTE — Telephone Encounter (Signed)
Never filled by you...Marland Kitchen please advise if refill is appropriate

## 2019-04-25 ENCOUNTER — Ambulatory Visit: Payer: No Typology Code available for payment source | Admitting: Nurse Practitioner

## 2019-05-11 ENCOUNTER — Other Ambulatory Visit: Payer: Self-pay | Admitting: Internal Medicine

## 2019-05-11 ENCOUNTER — Other Ambulatory Visit: Payer: Self-pay

## 2019-05-11 NOTE — Telephone Encounter (Signed)
Last filled 04/13/2019, note to pharmacy [facility] on or after 05/13/2019... please advise

## 2019-05-15 ENCOUNTER — Ambulatory Visit (INDEPENDENT_AMBULATORY_CARE_PROVIDER_SITE_OTHER): Payer: No Typology Code available for payment source | Admitting: Internal Medicine

## 2019-05-15 ENCOUNTER — Other Ambulatory Visit: Payer: Self-pay

## 2019-05-15 ENCOUNTER — Encounter: Payer: Self-pay | Admitting: Internal Medicine

## 2019-05-15 VITALS — BP 130/92 | HR 79 | Temp 97.5°F | Ht 68.5 in | Wt 230.0 lb

## 2019-05-15 DIAGNOSIS — R030 Elevated blood-pressure reading, without diagnosis of hypertension: Secondary | ICD-10-CM | POA: Diagnosis not present

## 2019-05-15 DIAGNOSIS — F5101 Primary insomnia: Secondary | ICD-10-CM

## 2019-05-15 DIAGNOSIS — I6300 Cerebral infarction due to thrombosis of unspecified precerebral artery: Secondary | ICD-10-CM | POA: Diagnosis not present

## 2019-05-15 DIAGNOSIS — F419 Anxiety disorder, unspecified: Secondary | ICD-10-CM | POA: Diagnosis not present

## 2019-05-15 DIAGNOSIS — E7800 Pure hypercholesterolemia, unspecified: Secondary | ICD-10-CM

## 2019-05-15 DIAGNOSIS — R519 Headache, unspecified: Secondary | ICD-10-CM | POA: Insufficient documentation

## 2019-05-15 DIAGNOSIS — K2101 Gastro-esophageal reflux disease with esophagitis, with bleeding: Secondary | ICD-10-CM | POA: Diagnosis not present

## 2019-05-15 MED ORDER — GABAPENTIN 300 MG PO CAPS: 300.0000 mg | ORAL_CAPSULE | Freq: Three times a day (TID) | ORAL | 2 refills | Status: DC

## 2019-05-15 MED ORDER — ONDANSETRON HCL 4 MG PO TABS: ORAL_TABLET | ORAL | 1 refills | Status: DC

## 2019-05-15 NOTE — Patient Instructions (Signed)

## 2019-05-15 NOTE — Assessment & Plan Note (Signed)
Continue Pantoprazole.

## 2019-05-15 NOTE — Assessment & Plan Note (Signed)
Continue Clonazepam as needed Will monitor

## 2019-05-15 NOTE — Progress Notes (Signed)
HPI  Patient presents to the clinic today to establish care and for management of the conditions listed below.  He is transferring care from Dr. Deborra Medina,  Anxiety: He takes Clonazepam as needed with good relief. He no longer sees a therapist. He does not follow with psych. He denies depression, SI/HI.  GERD: History of bleeding ulcers. He denies breakthrough on Pantoprazole.  UGI from 09/2016 reviewed.  Hx of Stroke with Right Hemiplegia:  His last LDL was 88, 12/2018. He denies myalgias on Simvastatin. He is taking ASA as well.   Insomnia: He has trouble falling asleep and staying asleep. He takes Ambien nightly with good relief.  Sleep study from 08/2014 reviewed.  Frequent Headaches: These occur a few times per week. Managed on Nortriptyline. He takes Tylenol for breakthrough  Elevated Blood Pressure: His BP today is 130/92. He is not taking any antihypertensive therapy at this time. ECG from 01/2019 reviewed.  Bilateral Carpal Tunnel: s/p repair on the left 04/2018 and scheduled 05/29/19 for the right. He takes Flexeril, Gabapentin and Hydrocodone, prescribed by the hand specialist.  Flu: 11/2018 Tetanus: 10/2018 Colon Screening: 02/2016 Vision Screening: annually Dentist: biannually  Past Medical History:  Diagnosis Date  . Anxiety    takes Xanax daily prn, panic attacks  . Chronic gastric ulcer with bleeding s/p suture repair 2012 09/29/2010   Head 3 EGD which failed to stop the bleeding. Had exploratory laparotomy on 10/04/2010. Possible cause of the gastric ulcer was NSAID use.   . Constipation    related to pain meds  . Depression   . Dizziness    occasionally  . Dyspnea    walking a bit  . Erosive esophagitis   . External hemorrhoid, bleeding   . Gastric ulcer    history of  . GERD (gastroesophageal reflux disease)    takes Protonix bid  . Hiatal hernia   . History of blood clots 2012   in abdomen  . History of blood transfusion 2012  . History of blood transfusion   .  Incisional hernia s/p open repair w mesh Aug 2013 05/26/2011  . Joint pain    knees  . Nocturia    depends on amount of fluid he drinks  . Pneumonia 2018  . Pre-diabetes   . Primary localized osteoarthritis of left hip 04/24/2014  . Serrated polyp of colon   . Sleep apnea    no cpap  mild no cpap needed  . Stroke ([place])   . Upper GI bleed   . Ventral hernia     Current Outpatient Medications  Medication Sig Dispense Refill  . acetaminophen (TYLENOL) 500 MG tablet Take 1,000 mg by mouth every 6 (six) hours as needed for mild pain or headache.    Marland Kitchen aspirin EC 81 MG EC tablet Take 1 tablet (81 mg total) by mouth daily.    . clonazePAM (KLONOPIN) 0.5 MG tablet Take 1 tablet (0.5 mg total) by mouth at bedtime. 30 tablet 5  . cyclobenzaprine (FLEXERIL) 10 MG tablet Take 1 tablet (10 mg total) by mouth 3 (three) times daily as needed for muscle spasms. 30 tablet 0  . fluticasone (FLONASE) 50 MCG/ACT nasal spray Place 2 sprays into both nostrils daily as needed for allergies. 16 g 11  . gabapentin (NEURONTIN) 300 MG capsule TAKE 1 CAPSULE (300 MG TOTAL) BY MOUTH 3 (THREE) TIMES DAILY. 90 capsule 0  . HYDROcodone-acetaminophen (NORCO) 10-325 MG tablet Take 1 tablet by mouth every 6 (six) hours as needed.  30 tablet 0  . NEOMYCIN-POLYMYXIN-HYDROCORTISONE (CORTISPORIN) 1 % SOLN OTIC solution Place 3 drops into the left ear every 8 (eight) hours. 10 mL 0  . nortriptyline (PAMELOR) 25 MG capsule TAKE 1 CAPSULE BY MOUTH AT BEDTIME 30 capsule 3  . ondansetron (ZOFRAN) 4 MG tablet TAKE 1 TABLET BY MOUTH EVERY 8 HOURS AS NEEDED FOR NAUSEA OR VOMITING. 20 tablet 1  . pantoprazole (PROTONIX) 40 MG tablet TAKE 1 TABLET (40 MG TOTAL) BY MOUTH 2 (TWO) TIMES DAILY. 180 tablet 1  . simvastatin (ZOCOR) 10 MG tablet Take 1 tablet (10 mg total) by mouth daily. 90 tablet 3  . zolpidem (AMBIEN CR) 12.5 MG CR tablet TAKE 1 TABLET (12.5 MG TOTAL) BY MOUTH AT BEDTIME AS NEEDED FOR SLEEP 30 tablet 0   No current  facility-administered medications for this visit.    Allergies  Allergen Reactions  . Dilaudid [Hydromorphone Hcl] Rash and Other (See Comments)    Shakey  . Metoclopramide Hcl Hives, Swelling, Rash and Other (See Comments)    Swelling in lips  . Nsaids     H/o bleeding gastric ulcers  . Benadryl [Diphenhydramine]     Causes patient to become hyper and pace    Family History  Problem Relation Age of Onset  . Diabetes Mother   . Liver disease Mother   . Diabetes Father   . Heart disease Father   . Healthy Sister   . Healthy Brother   . Healthy Brother   . Colon cancer Neg Hx   . Stomach cancer Neg Hx   . Rectal cancer Neg Hx   . Esophageal cancer Neg Hx   . Colon polyps Neg Hx     Social History   Socioeconomic History  . Marital status: Single    Spouse name: Not on file  . Number of children: 1  . Years of education: 35  . Highest education level: High school graduate  Occupational History  . Occupation: Maintenance - [employer]  Tobacco Use  . Smoking status: Never Smoker  . Smokeless tobacco: Current User    Types: Chew  . Tobacco comment: occ-Chew  Substance and Sexual Activity  . Alcohol use: Yes    Alcohol/week: 0.0 standard drinks    Comment: 09/04/11 "very seldom"  . Drug use: No  . Sexual activity: Yes  Other Topics Concern  . Not on file  Social History Narrative   Lives with brother in a one story home.  Has one daughter.  Works in Theatre manager for [employer].  Education: high school.       Patient is left-handed.   Social Determinants of Health   Financial Resource Strain:   . Difficulty of Paying Living Expenses:   Food Insecurity:   . Worried About Charity fundraiser in the Last Year:   . Arboriculturist in the Last Year:   Transportation Needs:   . Film/video editor (Medical):   Marland Kitchen Lack of Transportation (Non-Medical):   Physical Activity:   . Days of Exercise per Week:   . Minutes of Exercise per Session:   Stress:   .  Feeling of Stress :   Social Connections:   . Frequency of Communication with Friends and Family:   . Frequency of Social Gatherings with Friends and Family:   . Attends Religious Services:   . Active Member of Clubs or Organizations:   . Attends Archivist Meetings:   Marland Kitchen Marital Status:  Intimate Partner Violence:   . Fear of Current or Ex-Partner:   . Emotionally Abused:   Marland Kitchen Physically Abused:   . Sexually Abused:     ROS:  Constitutional: Pt reports headaches. Denies fever, malaise, fatigue, or abrupt weight changes.  HEENT: Denies eye pain, eye redness, ear pain, ringing in the ears, wax buildup, runny nose, nasal congestion, bloody nose, or sore throat. Respiratory: Denies difficulty breathing, shortness of breath, cough or sputum production.   Cardiovascular: Denies chest pain, chest tightness, palpitations or swelling in the hands or feet.  Gastrointestinal: Denies abdominal pain, bloating, constipation, diarrhea or blood in the stool.  GU: Denies frequency, urgency, pain with urination, blood in urine, odor or discharge. Musculoskeletal: Denies decrease in range of motion, difficulty with gait, muscle pain or joint pain and swelling.  Skin: Denies redness, rashes, lesions or ulcercations.  Neurological: Pt reports insomnia, paresthesia in hands. Denies dizziness, difficulty with memory, difficulty with speech or problems with balance and coordination.  Psych: Pt has a history of anxiety and depression. Denies SI/HI.  No other specific complaints in a complete review of systems (except as listed in HPI above).  PE:  BP (!) 130/92   Pulse 79   Temp (!) 97.5 F (36.4 C) (Temporal)   Ht 5' 8.5" (1.74 m)   Wt 230 lb (104.3 kg)   SpO2 98%   BMI 34.46 kg/m   Wt Readings from Last 3 Encounters:  01/18/19 150 lb (68 kg)  08/15/18 210 lb (95.3 kg)  04/08/18 207 lb 3.7 oz (94 kg)    General: Appears his stated age, obese, in NAD. HEENT: Head: normal shape and  size; Eyes: sclera white, EOMs intact;  Neck: Neck supple, trachea midline. No masses, lumps or thyromegaly present.  Cardiovascular: Normal rate and rhythm. S1,S2 noted.  No murmur, rubs or gallops noted. No JVD or BLE edema. No carotid bruits noted. Pulmonary/Chest: Normal effort and positive vesicular breath sounds. No respiratory distress. No wheezes, rales or ronchi noted.  Musculoskeletal:  Strength 5/5 BUE/BLE. No signs of joint swelling. No difficulty with gait.  Neurological: Alert and oriented. Psychiatric: Mood and affect normal. Behavior is normal. Judgment and thought content normal.     BMET    Component Value Date/Time   NA 136 01/18/2019 0134   K 4.3 01/18/2019 0134   CL 103 01/18/2019 0134   CO2 23 01/18/2019 0134   GLUCOSE 110 (H) 01/18/2019 0134   BUN 17 01/18/2019 0134   CREATININE 1.03 01/18/2019 0134   CALCIUM 8.9 01/18/2019 0134   GFRNONAA >60 01/18/2019 0134   GFRAA >60 01/18/2019 0134    Lipid Panel     Component Value Date/Time   CHOL 170 12/22/2018 1149   TRIG 115.0 12/22/2018 1149   HDL 59.50 12/22/2018 1149   CHOLHDL 3 12/22/2018 1149   VLDL 23.0 12/22/2018 1149   LDLCALC 88 12/22/2018 1149    CBC    Component Value Date/Time   WBC 11.9 (H) 01/18/2019 0045   RBC 4.42 01/18/2019 0045   HGB 13.5 01/18/2019 0045   HCT 40.4 01/18/2019 0045   PLT 297 01/18/2019 0045   MCV 91.4 01/18/2019 0045   MCH 30.5 01/18/2019 0045   MCHC 33.4 01/18/2019 0045   RDW 13.0 01/18/2019 0045   LYMPHSABS 2.6 01/18/2019 0045   MONOABS 1.0 01/18/2019 0045   EOSABS 0.2 01/18/2019 0045   BASOSABS 0.1 01/18/2019 0045    Hgb A1C Lab Results  Component Value Date   HGBA1C  5.9 (H) 10/06/2017     Assessment and Plan:  Elevated Blood Pressure:  Encouraged DASH die and exercise for weight loss  Make an appt for your annual exam Webb Silversmith, NP This visit occurred during the SARS-CoV-2 public health emergency.  Safety protocols were in place, including  screening questions prior to the visit, additional usage of staff PPE, and extensive cleaning of exam room while observing appropriate contact time as indicated for disinfecting solutions.

## 2019-05-15 NOTE — Assessment & Plan Note (Signed)
Continue Nortriptyline and Tylenol Will monitor

## 2019-05-15 NOTE — Assessment & Plan Note (Signed)
Continue Simvastatin and ASA Encouraged him to consume a low fat diet

## 2019-05-15 NOTE — Assessment & Plan Note (Signed)
Discussed the importance of BP control- low salt diet, exercise for weight loss Will continue to monitor BP at this time Continue Simvastatin and ASA He will continue to follow with neurology

## 2019-05-15 NOTE — Assessment & Plan Note (Signed)
Continue Ambien. 

## 2019-06-12 ENCOUNTER — Other Ambulatory Visit: Payer: Self-pay | Admitting: Internal Medicine

## 2019-06-12 MED ORDER — ZOLPIDEM TARTRATE ER 12.5 MG PO TBCR: 12.5000 mg | EXTENDED_RELEASE_TABLET | Freq: Every evening | ORAL | 0 refills | Status: DC | PRN

## 2019-06-12 NOTE — Telephone Encounter (Signed)
Last visit: 05/15/2019 No upcoming appointment scheduled.  Last UDS: 01/18/2019  Last filled: 05/11/2019 for #30

## 2019-06-20 ENCOUNTER — Encounter: Payer: Self-pay | Admitting: Internal Medicine

## 2019-06-20 ENCOUNTER — Other Ambulatory Visit: Payer: Self-pay

## 2019-06-20 ENCOUNTER — Other Ambulatory Visit: Payer: Self-pay | Admitting: Internal Medicine

## 2019-06-20 ENCOUNTER — Other Ambulatory Visit (INDEPENDENT_AMBULATORY_CARE_PROVIDER_SITE_OTHER): Payer: No Typology Code available for payment source

## 2019-06-20 DIAGNOSIS — E559 Vitamin D deficiency, unspecified: Secondary | ICD-10-CM

## 2019-06-21 LAB — VITAMIN D 25 HYDROXY (VIT D DEFICIENCY, FRACTURES): VITD: 28.63 ng/mL — ABNORMAL LOW (ref 30.00–100.00)

## 2019-06-21 MED ORDER — VITAMIN D (ERGOCALCIFEROL) 1.25 MG (50000 UNIT) PO CAPS: 50000.0000 [IU] | ORAL_CAPSULE | ORAL | 0 refills | Status: DC

## 2019-06-21 NOTE — Addendum Note (Signed)
Addended by: [name] on: 06/21/2019 12:49 PM   Modules accepted: Orders

## 2019-07-10 ENCOUNTER — Other Ambulatory Visit: Payer: Self-pay | Admitting: Internal Medicine

## 2019-07-10 NOTE — Telephone Encounter (Signed)
Last filled 06/12/2019, note to pharmacy [facility] on or after 07/12/2019... please advise

## 2019-07-21 ENCOUNTER — Encounter: Payer: Self-pay | Admitting: Internal Medicine

## 2019-07-21 MED ORDER — CLONAZEPAM 0.5 MG PO TABS: 0.5000 mg | ORAL_TABLET | Freq: Every day | ORAL | 0 refills | Status: DC

## 2019-07-21 NOTE — Telephone Encounter (Signed)
Last filled 12/2018 with 5 refills by Dr Deborra Medina... please advise

## 2019-07-21 NOTE — Telephone Encounter (Signed)
Refilled #30, I don't put refills on controlled substances

## 2019-07-25 ENCOUNTER — Other Ambulatory Visit (HOSPITAL_COMMUNITY): Payer: Self-pay | Admitting: Student

## 2019-08-01 ENCOUNTER — Encounter: Payer: No Typology Code available for payment source | Admitting: Internal Medicine

## 2019-08-02 ENCOUNTER — Other Ambulatory Visit: Payer: Self-pay

## 2019-08-02 ENCOUNTER — Encounter: Payer: Self-pay | Admitting: Internal Medicine

## 2019-08-02 ENCOUNTER — Ambulatory Visit (INDEPENDENT_AMBULATORY_CARE_PROVIDER_SITE_OTHER): Payer: No Typology Code available for payment source | Admitting: Internal Medicine

## 2019-08-02 VITALS — BP 148/96 | HR 108 | Temp 97.7°F | Ht 68.5 in | Wt 230.0 lb

## 2019-08-02 DIAGNOSIS — Z0000 Encounter for general adult medical examination without abnormal findings: Secondary | ICD-10-CM

## 2019-08-02 DIAGNOSIS — H938X2 Other specified disorders of left ear: Secondary | ICD-10-CM

## 2019-08-02 MED ORDER — LISINOPRIL 10 MG PO TABS
10.0000 mg | ORAL_TABLET | Freq: Every day | ORAL | 0 refills | Status: DC
Start: 2019-08-02 — End: 2019-08-17

## 2019-08-02 NOTE — Progress Notes (Signed)
Subjective:    Patient ID: [name], male    DOB: Jun 02, 1970, 49 y.o.   MRN: [id number]  HPI  Pt presents to the clinic today for his annual exam.  Flu: 11/2018 Tetanus: 10/2018 Covid: 03/2019, 04/2019 Colon Screening: 02/2016 Vision Screening: as needed Dentist: annually  Diet: He does eat meat. He consumes fruit and veggies daily. He does eat some fried foods. He drinks mostly water, [employer]. Exercise: Walking, stairmaster  Review of Systems      Past Medical History:  Diagnosis Date  . Anxiety    takes Xanax daily prn, panic attacks  . Chronic gastric ulcer with bleeding s/p suture repair 2012 09/29/2010   Head 3 EGD which failed to stop the bleeding. Had exploratory laparotomy on 10/04/2010. Possible cause of the gastric ulcer was NSAID use.   . Constipation    related to pain meds  . Depression   . Dizziness    occasionally  . Dyspnea    walking a bit  . Erosive esophagitis   . External hemorrhoid, bleeding   . Gastric ulcer    history of  . GERD (gastroesophageal reflux disease)    takes Protonix bid  . Hiatal hernia   . History of blood clots 2012   in abdomen  . History of blood transfusion 2012  . History of blood transfusion   . Incisional hernia s/p open repair w mesh Aug 2013 05/26/2011  . Joint pain    knees  . Nocturia    depends on amount of fluid he drinks  . Pneumonia 2018  . Pre-diabetes   . Primary localized osteoarthritis of left hip 04/24/2014  . Serrated polyp of colon   . Sleep apnea    no cpap  mild no cpap needed  . Stroke ([place])   . Upper GI bleed   . Ventral hernia     Current Outpatient Medications  Medication Sig Dispense Refill  . acetaminophen (TYLENOL) 500 MG tablet Take 1,000 mg by mouth every 6 (six) hours as needed for mild pain or headache.    Marland Kitchen aspirin EC 81 MG EC tablet Take 1 tablet (81 mg total) by mouth daily.    . clonazePAM (KLONOPIN) 0.5 MG tablet Take 1 tablet (0.5 mg total) by mouth at bedtime. 30 tablet 0   . cyclobenzaprine (FLEXERIL) 10 MG tablet Take 1 tablet (10 mg total) by mouth 3 (three) times daily as needed for muscle spasms. 30 tablet 0  . fluticasone (FLONASE) 50 MCG/ACT nasal spray Place 2 sprays into both nostrils daily as needed for allergies. 16 g 11  . gabapentin (NEURONTIN) 300 MG capsule Take 1 capsule (300 mg total) by mouth 3 (three) times daily. 90 capsule 2  . HYDROcodone-acetaminophen (NORCO) 10-325 MG tablet Take 1 tablet by mouth every 6 (six) hours as needed. 30 tablet 0  . nortriptyline (PAMELOR) 25 MG capsule TAKE 1 CAPSULE BY MOUTH AT BEDTIME 30 capsule 3  . ondansetron (ZOFRAN) 4 MG tablet TAKE 1 TABLET BY MOUTH EVERY 8 HOURS AS NEEDED FOR NAUSEA OR VOMITING. 20 tablet 1  . pantoprazole (PROTONIX) 40 MG tablet TAKE 1 TABLET (40 MG TOTAL) BY MOUTH 2 (TWO) TIMES DAILY. 180 tablet 1  . simvastatin (ZOCOR) 10 MG tablet Take 1 tablet (10 mg total) by mouth daily. 90 tablet 3  . Vitamin D, Ergocalciferol, (DRISDOL) 1.25 MG (50000 UNIT) CAPS capsule Take 1 capsule (50,000 Units total) by mouth every 7 (seven) days. 12 capsule 0  .  zolpidem (AMBIEN CR) 12.5 MG CR tablet TAKE 1 TABLET (12.5 MG TOTAL) BY MOUTH AT BEDTIME AS NEEDED. FOR SLEEP 30 tablet 0   No current facility-administered medications for this visit.    Allergies  Allergen Reactions  . Dilaudid [Hydromorphone Hcl] Rash and Other (See Comments)    Shakey  . Metoclopramide Hcl Hives, Swelling, Rash and Other (See Comments)    Swelling in lips  . Nsaids     H/o bleeding gastric ulcers  . Benadryl [Diphenhydramine]     Causes patient to become hyper and pace    Family History  Problem Relation Age of Onset  . Diabetes Mother   . Liver disease Mother   . Diabetes Father   . Heart disease Father   . Healthy Sister   . Healthy Brother   . Healthy Brother   . Colon cancer Neg Hx   . Stomach cancer Neg Hx   . Rectal cancer Neg Hx   . Esophageal cancer Neg Hx   . Colon polyps Neg Hx     Social History    Socioeconomic History  . Marital status: Single    Spouse name: Not on file  . Number of children: 1  . Years of education: 44  . Highest education level: High school graduate  Occupational History  . 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 Smoking status: Never Smoker  . Smokeless tobacco: Current User    Types: Chew  . Tobacco comment: occ-Chew  Vaping Use  . Vaping Use: Never used  Substance and Sexual Activity  . Alcohol use: Yes    Alcohol/week: 0.0 standard drinks    Comment: 09/04/11 "very seldom"  . Drug use: No  . Sexual activity: Yes  Other Topics Concern  . Not on file  Social History Narrative   Lives with brother in a one story home.  Has one daughter.  Works in Theatre manager for [employer].  Education: high school.       Patient is left-handed.   Social Determinants of Health   Financial Resource Strain:   . Difficulty of Paying Living Expenses:   Food Insecurity:   . Worried About Charity fundraiser in the Last Year:   . Arboriculturist in the Last Year:   Transportation Needs:   . Film/video editor (Medical):   Marland Kitchen Lack of Transportation (Non-Medical):   Physical Activity:   . Days of Exercise per Week:   . Minutes of Exercise per Session:   Stress:   . Feeling of Stress :   Social Connections:   . Frequency of Communication with Friends and Family:   . Frequency of Social Gatherings with Friends and Family:   . Attends Religious Services:   . Active Member of Clubs or Organizations:   . Attends Archivist Meetings:   Marland Kitchen Marital Status:   Intimate Partner Violence:   . Fear of Current or Ex-Partner:   . Emotionally Abused:   Marland Kitchen Physically Abused:   . Sexually Abused:      Constitutional: Denies fever, malaise, fatigue, headache or abrupt weight changes.  HEENT: Denies eye pain, eye redness, ear pain, ringing in the ears, wax buildup, runny nose, nasal congestion, bloody nose, or sore throat. Respiratory: Denies  difficulty breathing, shortness of breath, cough or sputum production.   Cardiovascular: Denies chest pain, chest tightness, palpitations or swelling in the hands or feet.  Gastrointestinal: Denies abdominal pain, bloating, constipation, diarrhea or  blood in the stool.  GU: Denies urgency, frequency, pain with urination, burning sensation, blood in urine, odor or discharge. Musculoskeletal: Denies decrease in range of motion, difficulty with gait, muscle pain or joint pain and swelling.  Skin: Denies redness, rashes, lesions or ulcercations.  Neurological: Pt reports insomnia, intermittent numbness in hands.. Denies dizziness, difficulty with memory, difficulty with speech or problems with balance and coordination.  Psych: Pt has a history of anxiety and depression. Denies SI/HI.  No other specific complaints in a complete review of systems (except as listed in HPI above).  Objective:   Physical Exam  BP (!) 148/96   Pulse (!) 108   Temp 97.7 F (36.5 C) (Temporal)   Ht 5' 8.5" (1.74 m)   Wt 230 lb (104.3 kg)   SpO2 97%   BMI 34.46 kg/m   Wt Readings from Last 3 Encounters:  05/15/19 230 lb (104.3 kg)  01/18/19 150 lb (68 kg)  08/15/18 210 lb (95.3 kg)    General: Appears his stated age, obese, in NAD. Skin: Warm, dry and intact. No rashesnoted. HEENT: Head: normal shape and size; Eyes: sclera white, no icterus, conjunctiva pink, PERRLA and EOMs intact; Ears: 1 cm mass noted of left external ear canal;  Neck:  Neck supple, trachea midline. No masses, lumps or thyromegaly present.  Cardiovascular: Normal rate and rhythm. S1,S2 noted.  No murmur, rubs or gallops noted. No JVD or BLE edema. No carotid bruits noted. Pulmonary/Chest: Normal effort and positive vesicular breath sounds. No respiratory distress. No wheezes, rales or ronchi noted.  Abdomen: Soft and nontender. Normal bowel sounds. No distention or masses noted. Liver, spleen and kidneys non palpable. Musculoskeletal:  Strength 5/5 BUE/BLE. No difficulty with gait.  Neurological: Alert and oriented. Cranial nerves II-XII grossly intact. Coordination normal.  Psychiatric: Mood and affect normal. Behavior is normal. Judgment and thought content normal.    BMET    Component Value Date/Time   NA 136 01/18/2019 0134   K 4.3 01/18/2019 0134   CL 103 01/18/2019 0134   CO2 23 01/18/2019 0134   GLUCOSE 110 (H) 01/18/2019 0134   BUN 17 01/18/2019 0134   CREATININE 1.03 01/18/2019 0134   CALCIUM 8.9 01/18/2019 0134   GFRNONAA >60 01/18/2019 0134   GFRAA >60 01/18/2019 0134    Lipid Panel     Component Value Date/Time   CHOL 170 12/22/2018 1149   TRIG 115.0 12/22/2018 1149   HDL 59.50 12/22/2018 1149   CHOLHDL 3 12/22/2018 1149   VLDL 23.0 12/22/2018 1149   LDLCALC 88 12/22/2018 1149    CBC    Component Value Date/Time   WBC 11.9 (H) 01/18/2019 0045   RBC 4.42 01/18/2019 0045   HGB 13.5 01/18/2019 0045   HCT 40.4 01/18/2019 0045   PLT 297 01/18/2019 0045   MCV 91.4 01/18/2019 0045   MCH 30.5 01/18/2019 0045   MCHC 33.4 01/18/2019 0045   RDW 13.0 01/18/2019 0045   LYMPHSABS 2.6 01/18/2019 0045   MONOABS 1.0 01/18/2019 0045   EOSABS 0.2 01/18/2019 0045   BASOSABS 0.1 01/18/2019 0045    Hgb A1C Lab Results  Component Value Date   HGBA1C 5.9 (H) 10/06/2017           Assessment & Plan:   Preventative Health Maintenance:  Encouraged him to get a flu shot in the fall Tetanus UTD Covid UTD Colon screening UTD Encouraged him to consume a balanced diet and exercise regime Advised him to see an eye doctor  and dentist annually Will check CBC, CMET, Lipid, A1C today  Mass of Left Ear Canal:  Referral to ENT for further evaluation  RTC in 1 year, sooner if needed Webb Silversmith, NP This visit occurred during the SARS-CoV-2 public health emergency.  Safety protocols were in place, including screening questions prior to the visit, additional usage of staff PPE, and extensive cleaning  of exam room while observing appropriate contact time as indicated for disinfecting solutions.

## 2019-08-03 ENCOUNTER — Encounter: Payer: Self-pay | Admitting: Internal Medicine

## 2019-08-03 LAB — CBC
HCT: 42.9 % (ref 39.0–52.0)
Hemoglobin: 14.4 g/dL (ref 13.0–17.0)
MCHC: 33.6 g/dL (ref 30.0–36.0)
MCV: 91.4 fl (ref 78.0–100.0)
Platelets: 323 10*3/uL (ref 150.0–400.0)
RBC: 4.7 Mil/uL (ref 4.22–5.81)
RDW: 13.9 % (ref 11.5–15.5)
WBC: 11.5 10*3/uL — ABNORMAL HIGH (ref 4.0–10.5)

## 2019-08-03 LAB — COMPREHENSIVE METABOLIC PANEL
ALT: 38 U/L (ref 0–53)
AST: 29 U/L (ref 0–37)
Albumin: 4.9 g/dL (ref 3.5–5.2)
Alkaline Phosphatase: 58 U/L (ref 39–117)
BUN: 15 mg/dL (ref 6–23)
CO2: 27 mEq/L (ref 19–32)
Calcium: 10 mg/dL (ref 8.4–10.5)
Chloride: 105 mEq/L (ref 96–112)
Creatinine, Ser: 1.33 mg/dL (ref 0.40–1.50)
GFR: 57.1 mL/min — ABNORMAL LOW (ref >60.00)
Glucose, Bld: 100 mg/dL — ABNORMAL HIGH (ref 70–99)
Potassium: 4.2 mEq/L (ref 3.5–5.1)
Sodium: 142 mEq/L (ref 135–145)
Total Bilirubin: 0.6 mg/dL (ref 0.2–1.2)
Total Protein: 7.5 g/dL (ref 6.0–8.3)

## 2019-08-03 LAB — HEMOGLOBIN A1C: Hgb A1c MFr Bld: 6.2 % (ref 4.6–6.5)

## 2019-08-03 LAB — LIPID PANEL
Cholesterol: 177 mg/dL (ref 0–200)
HDL: 65.3 mg/dL (ref >39.00)
LDL Cholesterol: 86 mg/dL (ref 0–99)
NonHDL: 111.75
Total CHOL/HDL Ratio: 3
Triglycerides: 128 mg/dL (ref 0.0–149.0)
VLDL: 25.6 mg/dL (ref 0.0–40.0)

## 2019-08-03 NOTE — Patient Instructions (Signed)
Health Maintenance After Age 49 After age 49, you are at a higher risk for certain long-term diseases and infections as well as injuries from falls. Falls are a major cause of broken bones and head injuries in people who are older than age 49. Getting regular preventive care can help to keep you healthy and well. Preventive care includes getting regular testing and making lifestyle changes as recommended by your health care provider. Talk with your health care provider about:  Which screenings and tests you should have. A screening is a test that checks for a disease when you have no symptoms.  A diet and exercise plan that is right for you. What should I know about screenings and tests to prevent falls? Screening and testing are the best ways to find a health problem early. Early diagnosis and treatment give you the best chance of managing medical conditions that are common after age 49. Certain conditions and lifestyle choices may make you more likely to have a fall. Your health care provider may recommend:  Regular vision checks. Poor vision and conditions such as cataracts can make you more likely to have a fall. If you wear glasses, make sure to get your prescription updated if your vision changes.  Medicine review. Work with your health care provider to regularly review all of the medicines you are taking, including over-the-counter medicines. Ask your health care provider about any side effects that may make you more likely to have a fall. Tell your health care provider if any medicines that you take make you feel dizzy or sleepy.  Osteoporosis screening. Osteoporosis is a condition that causes the bones to get weaker. This can make the bones weak and cause them to break more easily.  Blood pressure screening. Blood pressure changes and medicines to control blood pressure can make you feel dizzy.  Strength and balance checks. Your health care provider may recommend certain tests to check your  strength and balance while standing, walking, or changing positions.  Foot health exam. Foot pain and numbness, as well as not wearing proper footwear, can make you more likely to have a fall.  Depression screening. You may be more likely to have a fall if you have a fear of falling, feel emotionally low, or feel unable to do activities that you used to do.  Alcohol use screening. Using too much alcohol can affect your balance and may make you more likely to have a fall. What actions can I take to lower my risk of falls? General instructions  Talk with your health care provider about your risks for falling. Tell your health care provider if: ? You fall. Be sure to tell your health care provider about all falls, even ones that seem minor. ? You feel dizzy, sleepy, or off-balance.  Take over-the-counter and prescription medicines only as told by your health care provider. These include any supplements.  Eat a healthy diet and maintain a healthy weight. A healthy diet includes low-fat dairy products, low-fat (lean) meats, and fiber from whole grains, beans, and lots of fruits and vegetables. Home safety  Remove any tripping hazards, such as rugs, cords, and clutter.  Install safety equipment such as grab bars in bathrooms and safety rails on stairs.  Keep rooms and walkways well-lit. Activity   Follow a regular exercise program to stay fit. This will help you maintain your balance. Ask your health care provider what types of exercise are appropriate for you.  If you need a cane or   walker, use it as recommended by your health care provider.  Wear supportive shoes that have nonskid soles. Lifestyle  Do not drink alcohol if your health care provider tells you not to drink.  If you drink alcohol, limit how much you have: ? 0-1 drink a day for women. ? 0-2 drinks a day for men.  Be aware of how much alcohol is in your drink. In the [place] one drink equals one typical bottle of beer (12  oz), one-half glass of wine (5 oz), or one shot of hard liquor (1 oz).  Do not use any products that contain nicotine or tobacco, such as cigarettes and e-cigarettes. If you need help quitting, ask your health care provider. Summary  Having a healthy lifestyle and getting preventive care can help to protect your health and wellness after age 49.  Screening and testing are the best way to find a health problem early and help you avoid having a fall. Early diagnosis and treatment give you the best chance for managing medical conditions that are more common for people who are older than age 49.  Falls are a major cause of broken bones and head injuries in people who are older than age 49. Take precautions to prevent a fall at home.  Work with your health care provider to learn what changes you can make to improve your health and wellness and to prevent falls. This information is not intended to replace advice given to you by your health care provider. Make sure you discuss any questions you have with your health care provider. Document Revised: 05/12/2018 Document Reviewed: 12/02/2016 Elsevier Patient Education  2020 Elsevier Inc.  

## 2019-08-04 ENCOUNTER — Encounter: Payer: Self-pay | Admitting: Internal Medicine

## 2019-08-08 ENCOUNTER — Other Ambulatory Visit: Payer: Self-pay | Admitting: Internal Medicine

## 2019-08-08 ENCOUNTER — Encounter: Payer: Self-pay | Admitting: Internal Medicine

## 2019-08-08 MED ORDER — ZOLPIDEM TARTRATE ER 12.5 MG PO TBCR: 12.5000 mg | EXTENDED_RELEASE_TABLET | Freq: Every evening | ORAL | 0 refills | Status: DC | PRN

## 2019-08-08 NOTE — Telephone Encounter (Signed)
Last filled 07/12/2019, note to pharmacy [facility] 08/10/2019

## 2019-08-17 ENCOUNTER — Other Ambulatory Visit: Payer: Self-pay

## 2019-08-17 ENCOUNTER — Encounter: Payer: Self-pay | Admitting: Internal Medicine

## 2019-08-17 ENCOUNTER — Ambulatory Visit (INDEPENDENT_AMBULATORY_CARE_PROVIDER_SITE_OTHER): Payer: No Typology Code available for payment source | Admitting: Internal Medicine

## 2019-08-17 DIAGNOSIS — I10 Essential (primary) hypertension: Secondary | ICD-10-CM | POA: Insufficient documentation

## 2019-08-17 DIAGNOSIS — E1159 Type 2 diabetes mellitus with other circulatory complications: Secondary | ICD-10-CM | POA: Insufficient documentation

## 2019-08-17 MED ORDER — LABETALOL HCL 100 MG PO TABS
50.0000 mg | ORAL_TABLET | Freq: Two times a day (BID) | ORAL | 0 refills | Status: DC
Start: 2019-08-17 — End: 2019-09-01

## 2019-08-17 NOTE — Progress Notes (Signed)
Subjective:    Patient ID: [name], male    DOB: 01-Jul-1970, 49 y.o.   MRN: [id number]  HPI  Patient presents the clinic today for 2-week follow-up of hypertension.  At his last visit he was started on Lisinopril.  He has been taking the medication as prescribed.  His blood pressure today is 140/96.  Review of Systems      Past Medical History:  Diagnosis Date  . Anxiety    takes Xanax daily prn, panic attacks  . Chronic gastric ulcer with bleeding s/p suture repair 2012 09/29/2010   Head 3 EGD which failed to stop the bleeding. Had exploratory laparotomy on 10/04/2010. Possible cause of the gastric ulcer was NSAID use.   . Constipation    related to pain meds  . Depression   . Dizziness    occasionally  . Dyspnea    walking a bit  . Erosive esophagitis   . External hemorrhoid, bleeding   . Gastric ulcer    history of  . GERD (gastroesophageal reflux disease)    takes Protonix bid  . Hiatal hernia   . History of blood clots 2012   in abdomen  . History of blood transfusion 2012  . History of blood transfusion   . Incisional hernia s/p open repair w mesh Aug 2013 05/26/2011  . Joint pain    knees  . Nocturia    depends on amount of fluid he drinks  . Pneumonia 2018  . Pre-diabetes   . Primary localized osteoarthritis of left hip 04/24/2014  . Serrated polyp of colon   . Sleep apnea    no cpap  mild no cpap needed  . Stroke ([place])   . Upper GI bleed   . Ventral hernia     Current Outpatient Medications  Medication Sig Dispense Refill  . acetaminophen (TYLENOL) 500 MG tablet Take 1,000 mg by mouth every 6 (six) hours as needed for mild pain or headache.    Marland Kitchen aspirin EC 81 MG EC tablet Take 1 tablet (81 mg total) by mouth daily.    . clonazePAM (KLONOPIN) 0.5 MG tablet Take 1 tablet (0.5 mg total) by mouth at bedtime. 30 tablet 0  . cyclobenzaprine (FLEXERIL) 10 MG tablet Take 1 tablet (10 mg total) by mouth 3 (three) times daily as needed for muscle spasms.  30 tablet 0  . fluticasone (FLONASE) 50 MCG/ACT nasal spray Place 2 sprays into both nostrils daily as needed for allergies. 16 g 11  . gabapentin (NEURONTIN) 300 MG capsule TAKE 1 CAPSULE (300 MG TOTAL) BY MOUTH 3 (THREE) TIMES DAILY. 90 capsule 2  . HYDROcodone-acetaminophen (NORCO) 10-325 MG tablet Take 1 tablet by mouth every 6 (six) hours as needed. 30 tablet 0  . lisinopril (ZESTRIL) 10 MG tablet Take 1 tablet (10 mg total) by mouth daily. 30 tablet 0  . nortriptyline (PAMELOR) 25 MG capsule TAKE 1 CAPSULE BY MOUTH AT BEDTIME 30 capsule 3  . ondansetron (ZOFRAN) 4 MG tablet TAKE 1 TABLET BY MOUTH EVERY 8 HOURS AS NEEDED FOR NAUSEA OR VOMITING. 20 tablet 1  . pantoprazole (PROTONIX) 40 MG tablet TAKE 1 TABLET (40 MG TOTAL) BY MOUTH 2 (TWO) TIMES DAILY. 180 tablet 1  . simvastatin (ZOCOR) 10 MG tablet Take 1 tablet (10 mg total) by mouth daily. 90 tablet 3  . Vitamin D, Ergocalciferol, (DRISDOL) 1.25 MG (50000 UNIT) CAPS capsule Take 1 capsule (50,000 Units total) by mouth every 7 (seven) days. 12 capsule  0  . zolpidem (AMBIEN CR) 12.5 MG CR tablet Take 1 tablet (12.5 mg total) by mouth at bedtime as needed. for sleep 30 tablet 0   No current facility-administered medications for this visit.    Allergies  Allergen Reactions  . Dilaudid [Hydromorphone Hcl] Rash and Other (See Comments)    Shakey  . Metoclopramide Hcl Hives, Swelling, Rash and Other (See Comments)    Swelling in lips  . Nsaids     H/o bleeding gastric ulcers  . Benadryl [Diphenhydramine]     Causes patient to become hyper and pace    Family History  Problem Relation Age of Onset  . Diabetes Mother   . Liver disease Mother   . Diabetes Father   . Heart disease Father   . Healthy Sister   . Healthy Brother   . Healthy Brother   . Colon cancer Neg Hx   . Stomach cancer Neg Hx   . Rectal cancer Neg Hx   . Esophageal cancer Neg Hx   . Colon polyps Neg Hx     Social History   Socioeconomic History  . Marital  status: Single    Spouse name: Not on file  . Number of children: 1  . Years of education: 56  . Highest education level: High school graduate  Occupational History  . Occupation: Maintenance - [employer]  Tobacco Use  . Smoking status: Never Smoker  . Smokeless tobacco: Current User    Types: Chew  . Tobacco comment: occ-Chew  Vaping Use  . Vaping Use: Never used  Substance and Sexual Activity  . Alcohol use: Yes    Alcohol/week: 0.0 standard drinks    Comment: 09/04/11 "very seldom"  . Drug use: No  . Sexual activity: Yes  Other Topics Concern  . Not on file  Social History Narrative   Lives with brother in a one story home.  Has one daughter.  Works in Theatre manager for [employer].  Education: high school.       Patient is left-handed.   Social Determinants of Health   Financial Resource Strain:   . Difficulty of Paying Living Expenses:   Food Insecurity:   . Worried About Charity fundraiser in the Last Year:   . Arboriculturist in the Last Year:   Transportation Needs:   . Film/video editor (Medical):   Marland Kitchen Lack of Transportation (Non-Medical):   Physical Activity:   . Days of Exercise per Week:   . Minutes of Exercise per Session:   Stress:   . Feeling of Stress :   Social Connections:   . Frequency of Communication with Friends and Family:   . Frequency of Social Gatherings with Friends and Family:   . Attends Religious Services:   . Active Member of Clubs or Organizations:   . Attends Archivist Meetings:   Marland Kitchen Marital Status:   Intimate Partner Violence:   . Fear of Current or Ex-Partner:   . Emotionally Abused:   Marland Kitchen Physically Abused:   . Sexually Abused:      Constitutional: Denies fever, malaise, fatigue, headache or abrupt weight changes.  Respiratory: Denies difficulty breathing, shortness of breath, cough or sputum production.   Cardiovascular: Denies chest pain, chest tightness, palpitations or swelling in the hands or feet.    Gastrointestinal: Denies abdominal pain, bloating, constipation, diarrhea or blood in the stool.  Neurological: Pt reports intermittent dizziness. Denies difficulty with memory, difficulty with speech or problems  with balance and coordination.    No other specific complaints in a complete review of systems (except as listed in HPI above).   Objective:   Physical Exam   BP (!) 140/96   Pulse (!) 102   Temp 97.8 F (36.6 C) (Temporal)   Wt 238 lb (108 kg)   SpO2 98%   BMI 35.66 kg/m   Wt Readings from Last 3 Encounters:  08/02/19 230 lb (104.3 kg)  05/15/19 230 lb (104.3 kg)  01/18/19 150 lb (68 kg)    General: Appears his stated age, obese, in NAD. Cardiovascular: Tachycardic with normal rhythm. S1,S2 noted.  No murmur, rubs or gallops noted. No JVD or BLE edema. No carotid bruits noted. Pulmonary/Chest: Normal effort and positive vesicular breath sounds. No respiratory distress. No wheezes, rales or ronchi noted.  Neurological: Alert and oriented.    BMET    Component Value Date/Time   NA 142 08/02/2019 1637   K 4.2 08/02/2019 1637   CL 105 08/02/2019 1637   CO2 27 08/02/2019 1637   GLUCOSE 100 (H) 08/02/2019 1637   BUN 15 08/02/2019 1637   CREATININE 1.33 08/02/2019 1637   CALCIUM 10.0 08/02/2019 1637   GFRNONAA >60 01/18/2019 0134   GFRAA >60 01/18/2019 0134    Lipid Panel     Component Value Date/Time   CHOL 177 08/02/2019 1637   TRIG 128.0 08/02/2019 1637   HDL 65.30 08/02/2019 1637   CHOLHDL 3 08/02/2019 1637   VLDL 25.6 08/02/2019 1637   LDLCALC 86 08/02/2019 1637    CBC    Component Value Date/Time   WBC 11.5 (H) 08/02/2019 1637   RBC 4.70 08/02/2019 1637   HGB 14.4 08/02/2019 1637   HCT 42.9 08/02/2019 1637   PLT 323.0 08/02/2019 1637   MCV 91.4 08/02/2019 1637   MCH 30.5 01/18/2019 0045   MCHC 33.6 08/02/2019 1637   RDW 13.9 08/02/2019 1637   LYMPHSABS 2.6 01/18/2019 0045   MONOABS 1.0 01/18/2019 0045   EOSABS 0.2 01/18/2019 0045    BASOSABS 0.1 01/18/2019 0045    Hgb A1C Lab Results  Component Value Date   HGBA1C 6.2 08/02/2019           Assessment & Plan:     Webb Silversmith, NP This visit occurred during the SARS-CoV-2 public health emergency.  Safety protocols were in place, including screening questions prior to the visit, additional usage of staff PPE, and extensive cleaning of exam room while observing appropriate contact time as indicated for disinfecting solutions.

## 2019-08-17 NOTE — Assessment & Plan Note (Signed)
Uncontrolled on Lisinopril, will D/C Rx for labetalol 50 mg 1 tab p.o. twice daily  He will have someone monitor his blood pressure at work daily for the next week and update me in 1 week.

## 2019-08-17 NOTE — Patient Instructions (Signed)

## 2019-08-23 ENCOUNTER — Other Ambulatory Visit: Payer: Self-pay | Admitting: Internal Medicine

## 2019-08-23 MED ORDER — CLONAZEPAM 0.5 MG PO TABS: 0.5000 mg | ORAL_TABLET | Freq: Every day | ORAL | 0 refills | Status: DC

## 2019-08-23 NOTE — Telephone Encounter (Signed)
Last filled 07/21/2019.... please advise  °

## 2019-08-24 ENCOUNTER — Other Ambulatory Visit: Payer: Self-pay | Admitting: Internal Medicine

## 2019-08-24 ENCOUNTER — Other Ambulatory Visit: Payer: Self-pay | Admitting: Neurology

## 2019-08-24 ENCOUNTER — Encounter: Payer: Self-pay | Admitting: Internal Medicine

## 2019-08-24 MED ORDER — SIMVASTATIN 10 MG PO TABS: 10.0000 mg | ORAL_TABLET | Freq: Every day | ORAL | 2 refills | Status: DC

## 2019-08-24 NOTE — Telephone Encounter (Signed)
Going forward, his PCP should be refilling his statin.

## 2019-08-25 ENCOUNTER — Other Ambulatory Visit (HOSPITAL_COMMUNITY): Payer: Self-pay | Admitting: Neurosurgery

## 2019-09-01 ENCOUNTER — Encounter: Payer: Self-pay | Admitting: Internal Medicine

## 2019-09-01 ENCOUNTER — Other Ambulatory Visit: Payer: Self-pay | Admitting: Internal Medicine

## 2019-09-01 MED ORDER — LABETALOL HCL 100 MG PO TABS: 100.0000 mg | ORAL_TABLET | Freq: Three times a day (TID) | ORAL | 0 refills | Status: DC

## 2019-09-04 ENCOUNTER — Other Ambulatory Visit: Payer: Self-pay | Admitting: Internal Medicine

## 2019-09-04 ENCOUNTER — Other Ambulatory Visit: Payer: Self-pay | Admitting: Neurology

## 2019-09-04 NOTE — Telephone Encounter (Signed)
Last office visit 08/17/2019 for HTN.  Last refilled Zolpidem: 08/08/2019 for #30 with no refills.  Zofran: 05/15/2019 for #20 with 1 refill.  No future appointments.

## 2019-09-08 ENCOUNTER — Telehealth: Payer: Self-pay

## 2019-09-08 ENCOUNTER — Other Ambulatory Visit: Payer: Self-pay | Admitting: Internal Medicine

## 2019-09-08 ENCOUNTER — Encounter: Payer: Self-pay | Admitting: Internal Medicine

## 2019-09-08 DIAGNOSIS — I10 Essential (primary) hypertension: Secondary | ICD-10-CM

## 2019-09-08 MED ORDER — ZOLPIDEM TARTRATE ER 12.5 MG PO TBCR: 12.5000 mg | EXTENDED_RELEASE_TABLET | Freq: Every day | ORAL | 0 refills | Status: DC

## 2019-09-08 NOTE — Addendum Note (Signed)
Addended by: [name] on: 09/08/2019 02:03 PM   Modules accepted: Orders

## 2019-09-08 NOTE — Telephone Encounter (Signed)
That's fine

## 2019-09-08 NOTE — Telephone Encounter (Signed)
I received a call from [facility] from [facility].  He states that patient's Ambien had a hold on it not to fill until 8/7. This was last refilled on 08/10/19  Doren Custard states that there pharmacy is closed on the weekends, and patient would not be able to get this medication until Monday - he is wondering if they can have authorization to fill this today? He can be reached back at [phone]

## 2019-09-08 NOTE — Addendum Note (Signed)
Addended by: Lurlean Nanny on: 09/08/2019 01:22 PM   Modules accepted: Orders

## 2019-09-15 ENCOUNTER — Encounter: Payer: Self-pay | Admitting: Internal Medicine

## 2019-09-21 ENCOUNTER — Other Ambulatory Visit: Payer: Self-pay | Admitting: Internal Medicine

## 2019-09-21 MED ORDER — CLONAZEPAM 0.5 MG PO TABS: 0.5000 mg | ORAL_TABLET | Freq: Every day | ORAL | 0 refills | Status: DC

## 2019-09-21 NOTE — Telephone Encounter (Signed)
Last filled 08/23/2019... please advise due to be filled tomorrow

## 2019-09-29 ENCOUNTER — Encounter: Payer: Self-pay | Admitting: Internal Medicine

## 2019-10-02 ENCOUNTER — Other Ambulatory Visit: Payer: Self-pay | Admitting: Internal Medicine

## 2019-10-02 MED ORDER — LISINOPRIL 20 MG PO TABS
20.0000 mg | ORAL_TABLET | Freq: Every day | ORAL | 2 refills | Status: DC
Start: 2019-10-02 — End: 2019-12-27

## 2019-10-04 MED ORDER — LABETALOL HCL 100 MG PO TABS: 100.0000 mg | ORAL_TABLET | Freq: Three times a day (TID) | ORAL | 0 refills | Status: DC

## 2019-10-06 ENCOUNTER — Ambulatory Visit (HOSPITAL_COMMUNITY)
Admission: RE | Admit: 2019-10-06 | Discharge: 2019-10-06 | Disposition: A | Discharge status: Home / Self Care | Payer: No Typology Code available for payment source | Source: Ambulatory Visit | Attending: Cardiology | Admitting: Cardiology

## 2019-10-06 ENCOUNTER — Other Ambulatory Visit: Payer: Self-pay

## 2019-10-06 ENCOUNTER — Other Ambulatory Visit: Payer: Self-pay | Admitting: Internal Medicine

## 2019-10-06 DIAGNOSIS — I10 Essential (primary) hypertension: Secondary | ICD-10-CM | POA: Insufficient documentation

## 2019-10-06 MED ORDER — ZOLPIDEM TARTRATE ER 12.5 MG PO TBCR: 12.5000 mg | EXTENDED_RELEASE_TABLET | Freq: Every day | ORAL | 0 refills | Status: DC

## 2019-10-06 NOTE — Telephone Encounter (Signed)
Last filled 09/08/2019... note to be filled on or after 10/09/2019

## 2019-10-13 ENCOUNTER — Other Ambulatory Visit: Payer: Self-pay | Admitting: Internal Medicine

## 2019-10-13 ENCOUNTER — Encounter: Payer: Self-pay | Admitting: Internal Medicine

## 2019-10-13 DIAGNOSIS — I10 Essential (primary) hypertension: Secondary | ICD-10-CM

## 2019-10-13 DIAGNOSIS — E7800 Pure hypercholesterolemia, unspecified: Secondary | ICD-10-CM

## 2019-10-13 DIAGNOSIS — K219 Gastro-esophageal reflux disease without esophagitis: Secondary | ICD-10-CM

## 2019-10-23 ENCOUNTER — Other Ambulatory Visit: Payer: Self-pay | Admitting: Internal Medicine

## 2019-10-23 MED ORDER — CLONAZEPAM 0.5 MG PO TABS: 0.5000 mg | ORAL_TABLET | Freq: Every day | ORAL | 0 refills | Status: DC

## 2019-10-23 NOTE — Telephone Encounter (Signed)
LAST APPOINTMENT DATE: 05/15/2019   NEXT APPOINTMENT DATE: Visit date not found    LAST REFILL: 09/21/2019  QTY:  #30 no rf

## 2019-10-27 ENCOUNTER — Other Ambulatory Visit (HOSPITAL_COMMUNITY): Payer: Self-pay | Admitting: Neurological Surgery

## 2019-10-30 NOTE — Progress Notes (Signed)
Cardiology Office Note:    Date:  11/01/2019   ID:  [name], DOB 1970-04-10, MRN [id number]  PCP:  [name], NP  [facility] Cardiologist:  No primary care provider on file.  [facility] Electrophysiologist:  None   Referring MD: [name], NP    History of Present Illness:    [name] is a 49 y.o. male with a hx of hypertension, prior CVA, and GERD who was referred by Webb Silversmith, NP for evaluation of hypertension.  The patient states that he has been having elevated blood pressure for the past several months. Has been keeping a log at home and ranging 130s-180s/80-100. HR has been in 90-100s. Patient feels tired and has some dyspnea on exertion. Has some chest pressure when he is exerting himself that is relieved with sitting down and resting. Has been occurring more frequently. Has some lightheadedness when going from seated to standing position. No lightheadedness with exertion.   Prior CVA was in 2019. Had facial numbness and right sided weakness. Stayed 2 nights in the hospital. Was placed on ASA and simvastatin $RemoveBeforeD'10mg'ElRyDbxmUpygvM$  daily.   Family history notable for coronary artery disease and had possible MI. Mother with cirrhosis.   TTE 2019 with normal EF (55-60%), Grade II diastolic dysfunction. Carotid ultrasound 2019: bilateral mild plaquing 1-39%.  Past Medical History:  Diagnosis Date  . Anxiety    takes Xanax daily prn, panic attacks  . Chronic gastric ulcer with bleeding s/p suture repair 2012 09/29/2010   Head 3 EGD which failed to stop the bleeding. Had exploratory laparotomy on 10/04/2010. Possible cause of the gastric ulcer was NSAID use.   . Constipation    related to pain meds  . Depression   . Dizziness    occasionally  . Dyspnea    walking a bit  . Erosive esophagitis   . External hemorrhoid, bleeding   . Gastric ulcer    history of  . GERD (gastroesophageal reflux disease)    takes Protonix bid  . Hiatal hernia   . History of blood  clots 2012   in abdomen  . History of blood transfusion 2012  . History of blood transfusion   . Incisional hernia s/p open repair w mesh Aug 2013 05/26/2011  . Joint pain    knees  . Nocturia    depends on amount of fluid he drinks  . Pneumonia 2018  . Pre-diabetes   . Primary localized osteoarthritis of left hip 04/24/2014  . Serrated polyp of colon   . Sleep apnea    no cpap  mild no cpap needed  . Stroke ([place])   . Upper GI bleed   . Ventral hernia     Past Surgical History:  Procedure Laterality Date  . ANTERIOR CERVICAL DECOMPRESSION/DISCECTOMY FUSION 4 LEVELS N/A 04/08/2018   Procedure: ACDF - C3-C4 - C4-C5 - C5-C6 - C6-C7;  Surgeon: Kary Kos, MD;  Location: [place];  Service: Neurosurgery;  Laterality: N/A;  . CARPAL TUNNEL RELEASE Left 04/03/2018  . COLONOSCOPY    . ESOPHAGEAL MANOMETRY N/A 10/19/2016   Procedure: ESOPHAGEAL MANOMETRY (EM);  Surgeon: Mauri Pole, MD;  Location: [facility];  Service: Endoscopy;  Laterality: N/A;  . ESOPHAGOGASTRODUODENOSCOPY    . ESOPHAGOGASTRODUODENOSCOPY N/A 01/22/2017   Procedure: ESOPHAGOGASTRODUODENOSCOPY (EGD);  Surgeon: [name], MD;  Location: [facility];  Service: General;  Laterality: N/A;  . INCISIONAL HERNIA REPAIR  09/04/2011   Procedure: HERNIA REPAIR INCISIONAL;  Surgeon: Aaron Edelman  Lilyan Punt, DO;  Location: [facility];  Service: General;  Laterality: N/A;  debridment calcified mass  . INSERTION OF MESH  09/04/2011   retrorectus ultrapro "30x30cm"  . INSERTION OF MESH N/A 01/22/2017   Procedure: INSERTION OF MESH;  Surgeon: [name], MD;  Location: [facility];  Service: General;  Laterality: N/A;  . NISSEN FUNDOPLICATION     Open 3790W  . POLYPECTOMY    . STOMACH SURGERY  10/05/2010   Oversewing of gastric ulcer.  Dr Brantley Stage  . TOTAL HIP ARTHROPLASTY Left 04/24/2014   Procedure: LEFT TOTAL HIP ARTHROPLASTY;  Surgeon: Marchia Bond, MD;  Location: [place];  Service: Orthopedics;  Laterality: Left;  . UPPER GASTROINTESTINAL  ENDOSCOPY      Current Medications: Current Meds  Medication Sig  . acetaminophen (TYLENOL) 500 MG tablet Take 1,000 mg by mouth every 6 (six) hours as needed for mild pain or headache.  Marland Kitchen aspirin EC 81 MG EC tablet Take 1 tablet (81 mg total) by mouth daily.  . clonazePAM (KLONOPIN) 0.5 MG tablet Take 1 tablet (0.5 mg total) by mouth at bedtime.  . cyclobenzaprine (FLEXERIL) 10 MG tablet Take 1 tablet (10 mg total) by mouth 3 (three) times daily as needed for muscle spasms.  . fluticasone (FLONASE) 50 MCG/ACT nasal spray Place 2 sprays into both nostrils daily as needed for allergies.  Marland Kitchen gabapentin (NEURONTIN) 300 MG capsule TAKE 1 CAPSULE (300 MG TOTAL) BY MOUTH 3 (THREE) TIMES DAILY.  Marland Kitchen HYDROcodone-acetaminophen (NORCO) 10-325 MG tablet Take 1 tablet by mouth every 6 (six) hours as needed.  . labetalol (NORMODYNE) 100 MG tablet Take 1 tablet (100 mg total) by mouth 3 (three) times daily.  Marland Kitchen lisinopril (ZESTRIL) 20 MG tablet Take 1 tablet (20 mg total) by mouth daily.  . meloxicam (MOBIC) 7.5 MG tablet Take 7.5 mg by mouth 2 (two) times daily.  . nortriptyline (PAMELOR) 25 MG capsule TAKE 1 CAPSULE BY MOUTH AT BEDTIME  . ondansetron (ZOFRAN) 4 MG tablet TAKE 1 TABLET BY MOUTH EVERY 8 HOURS AS NEEDED FOR NAUSEA OR VOMITING.  . pantoprazole (PROTONIX) 40 MG tablet TAKE 1 TABLET (40 MG TOTAL) BY MOUTH 2 (TWO) TIMES DAILY.  Marland Kitchen Vitamin D, Ergocalciferol, (DRISDOL) 1.25 MG (50000 UNIT) CAPS capsule Take 1 capsule (50,000 Units total) by mouth every 7 (seven) days.  Marland Kitchen zolpidem (AMBIEN CR) 12.5 MG CR tablet Take 1 tablet (12.5 mg total) by mouth at bedtime.  . [DISCONTINUED] simvastatin (ZOCOR) 10 MG tablet Take 1 tablet (10 mg total) by mouth daily.     Allergies:   Dilaudid [hydromorphone hcl], Hydromorphone hcl, Metoclopramide, Metoclopramide hcl, Nsaids, and Benadryl [diphenhydramine]   Social History   Socioeconomic History  . Marital status: Single    Spouse name: Not on file  . Number  of children: 1  . Years of education: 31  . Highest education level: High school graduate  Occupational History  . Occupation: Maintenance - [employer]  Tobacco Use  . Smoking status: Never Smoker  . Smokeless tobacco: Current User    Types: Chew  . Tobacco comment: occ-Chew  Vaping Use  . Vaping Use: Never used  Substance and Sexual Activity  . Alcohol use: Yes    Alcohol/week: 0.0 standard drinks    Comment: 09/04/11 "very seldom"  . Drug use: No  . Sexual activity: Yes  Other Topics Concern  . Not on file  Social History Narrative   Lives with brother in a one story home.  Has one  daughter.  Works in Theatre manager for [employer].  Education: high school.       Patient is left-handed.   Social Determinants of Health   Financial Resource Strain:   . Difficulty of Paying Living Expenses: Not on file  Food Insecurity:   . Worried About Charity fundraiser in the Last Year: Not on file  . Ran Out of Food in the Last Year: Not on file  Transportation Needs:   . Lack of Transportation (Medical): Not on file  . Lack of Transportation (Non-Medical): Not on file  Physical Activity:   . Days of Exercise per Week: Not on file  . Minutes of Exercise per Session: Not on file  Stress:   . Feeling of Stress : Not on file  Social Connections:   . Frequency of Communication with Friends and Family: Not on file  . Frequency of Social Gatherings with Friends and Family: Not on file  . Attends Religious Services: Not on file  . Active Member of Clubs or Organizations: Not on file  . Attends Archivist Meetings: Not on file  . Marital Status: Not on file     Family History: The patient's family history includes Diabetes in his father and mother; Healthy in his brother, brother, and sister; Heart disease in his father; Liver disease in his mother. There is no history of Colon cancer, Stomach cancer, Rectal cancer, Esophageal cancer, or Colon polyps.  ROS:   Please see the  history of present illness.    Notable for chest pain and dyspnea on exertion and worsening fatigue. The patient denies palpitations, PND, orthopnea, or leg swelling. Denies cough, fever, chills. Denies nausea, vomiting. Denies syncope or presyncope. Denies snoring.  EKGs/Labs/Other Studies Reviewed:    The following studies were reviewed today: TTE 30-Apr-2017: Left ventricle: The cavity size was normal. Wall thickness was  normal. Systolic function was normal. The estimated ejection  fraction was in the range of 55% to 60%. Wall motion was normal;  there were no regional wall motion abnormalities. Features are  consistent with a pseudonormal left ventricular filling pattern,  with concomitant abnormal relaxation and increased filling pressure  (grade 2 diastolic dysfunction).   -------------------------------------------------------------------  Aortic valve:  Structurally normal valve.  Cusp separation was  normal. Doppler: Transvalvular velocity was within the normal  range. There was no stenosis. There was no regurgitation.   -------------------------------------------------------------------  Aorta: Aortic root: The aortic root was normal in size.  Ascending aorta: The ascending aorta was normal in size.   -------------------------------------------------------------------  Mitral valve:  Structurally normal valve.  Leaflet separation was  normal. Doppler: Transvalvular velocity was within the normal  range. There was no evidence for stenosis. There was trivial  regurgitation.  Valve area by pressure half-time: 4.89 cm^2.  Indexed valve area by pressure half-time: 2.29 cm^2/m^2.  Peak  gradient (D): 2 mm Hg.   -------------------------------------------------------------------  Left atrium: The atrium was normal in size.   -------------------------------------------------------------------  Right ventricle: The cavity size was normal. Systolic function was    normal.   -------------------------------------------------------------------  Pulmonic valve:  The valve appears to be grossly normal.  Doppler: There was trivial regurgitation.   -------------------------------------------------------------------  Tricuspid valve:  Structurally normal valve.  Leaflet separation  was normal. Doppler: Transvalvular velocity was within the normal  range. There was no significant regurgitation.   -------------------------------------------------------------------  Right atrium: The atrium was normal in size.   -------------------------------------------------------------------  Pericardium: There was no pericardial effusion.   Carotid  ultrasound 10/07/19: Final Interpretation:  Right Carotid: Velocities in the right ICA are consistent with a 1-39%  stenosis.  Left Carotid: Velocities in the left ICA are consistent with a 1-39%  stenosis.   EKG:  EKG is ordered today.  The ekg ordered today demonstrates sinus tachycardia, early r-wave progression, no ischemia  Recent Labs: 08/02/2019: ALT 38; BUN 15; Creatinine, Ser 1.33; Hemoglobin 14.4; Platelets 323.0; Potassium 4.2; Sodium 142  Recent Lipid Panel    Component Value Date/Time   CHOL 177 08/02/2019 1637   TRIG 128.0 08/02/2019 1637   HDL 65.30 08/02/2019 1637   CHOLHDL 3 08/02/2019 1637   VLDL 25.6 08/02/2019 1637   LDLCALC 86 08/02/2019 1637    Physical Exam:    VS:  BP 136/70   Pulse (!) 109   Ht 5\' 9"  (1.753 m)   Wt 244 lb (110.7 kg)   SpO2 97%   BMI 36.03 kg/m     Wt Readings from Last 3 Encounters:  11/01/19 244 lb (110.7 kg)  08/17/19 238 lb (108 kg)  08/02/19 230 lb (104.3 kg)     GEN:  Well nourished, well developed in no acute distress HEENT: Normal NECK: No JVD; No carotid bruits LYMPHATICS: No lymphadenopathy CARDIAC: Tachycardic, regular, no murmurs, rubs, gallops RESPIRATORY:  Clear to auscultation without rales, wheezing or rhonchi  ABDOMEN: Soft,  non-tender, non-distended MUSCULOSKELETAL:  No edema; No deformity  SKIN: Warm and dry NEUROLOGIC:  Alert and oriented x 3 PSYCHIATRIC:  Normal affect   ASSESSMENT:    1. Stable angina pectoris ([place])   2. Essential hypertension   3. Pure hypercholesterolemia   4. Precordial pain   5. Cerebrovascular accident (CVA), unspecified mechanism ([place])    PLAN:    In order of problems listed above:  #Chest Pain #Stable Angina: Patient with chest pressure with exertion that is relieved with rest concerning for stable angina. Symptoms have been getting progressively worse. Also notes worsening DOE that again is relieved with resting.  -Check coronary CTA -Last TTE 2019 with normal EF, grade II diastolic dysfunction -Continue ASA 81mg  daily -Stop simvastatin and start crestor 20mg  daily -Blood pressure control as below  #Hypertension: Poorly controlled with SBPs 140-180s/80-100s at home.  -Start amlodipine 5mg  daily -If blood pressure remains >130, will increase amlodipine to 10mg  daily -Patient has been and will continue to keep BP log for [place] to review and adjust medication as needed -Continue lisinopril 20mg  daily -Continue labetalol 100mg  TID -Will see back in 6 weeks to adjust medications as needed  #History of CVA: Had CVA with right sided weakness and facial numbness in 2019. No residual deficits. -Bilateral carotid ultrasound with minimal plaque -Continue ASA 81mg  daily -Stop simvastatin and start crestor 20mg  daily  #Hyperlipdiemia: LDL 86, HDL 63. Managed by PCP. -Stop simvastatin 10mg   -Start Crestor 20mg  daily  Medication Adjustments/Labs and Tests Ordered: Current medicines are reviewed at length with the patient today.  Concerns regarding medicines are outlined above.  Orders Placed This Encounter  Procedures  . CT CORONARY MORPH W/CTA COR W/SCORE W/CA W/CM &/OR WO/CM  . CT CORONARY FRACTIONAL FLOW RESERVE DATA PREP  . CT CORONARY FRACTIONAL FLOW RESERVE FLUID  ANALYSIS  . EKG 12-Lead   Meds ordered this encounter  Medications  . amLODipine (NORVASC) 5 MG tablet    Sig: Take 1 tablet (5 mg total) by mouth daily.    Dispense:  180 tablet    Refill:  3  . rosuvastatin (CRESTOR) 20 MG tablet  Sig: Take 1 tablet (20 mg total) by mouth daily.    Dispense:  90 tablet    Refill:  3  . metoprolol tartrate (LOPRESSOR) 100 MG tablet    Sig: Take 1 tablet by mouth 2 hours prior to Cardiac CT    Dispense:  1 tablet    Refill:  0    Patient Instructions  Medication Instructions:  Your physician has recommended you make the following change in your medication:   1. STOP: simvastatin  2. START: rosuvastatin (crestor) 20 mg tablet: Take 1 tablet by mouth once a day  3. START: amlodipine (norvasc) 5 mg tablet: Take 1 tablet by mouth once a day  *If you need a refill on your cardiac medications before your next appointment, please call your pharmacy*   Lab Work: None  If you have labs (blood work) drawn today and your tests are completely normal, you will receive your results only by: Marland Kitchen MyChart Message (if you have MyChart) OR . A paper copy in the mail If you have any lab test that is abnormal or we need to change your treatment, we will call you to review the results.   Testing/Procedures: Your physician has requested that you have cardiac CT.   Follow-Up: Follow up with Dr. Johney Frame on 12/12/19 at 10:40 AM   Other Instructions Your cardiac CT will be scheduled at one of the below locations:   [facility] [address] [place], [place] [address] [phone]  [facility] [address] [place], [place] [address] [phone]  If scheduled at [facility], please arrive at the [facility] main entrance of [facility] 30 minutes prior to test start time. Proceed to the [facility] Radiology Department (first floor) to check-in and test prep.  If  scheduled at [facility], please arrive 15 mins early for check-in and test prep.  Please follow these instructions carefully (unless otherwise directed):  Hold all erectile dysfunction medications at least 3 days (72 hrs) prior to test.  On the Night Before the Test: . Be sure to Drink plenty of water. . Do not consume any caffeinated/decaffeinated beverages or chocolate 12 hours prior to your test. . Do not take any antihistamines 12 hours prior to your test.   On the Day of the Test: . Drink plenty of water. Do not drink any water within one hour of the test. . Do not eat any food 4 hours prior to the test. . You may take your regular medications prior to the test.  . Take your labetalol and metoprolol (Lopressor) 100 MG two hours prior to test. . FEMALES- please wear underwire-free bra if available     After the Test: . Drink plenty of water. . After receiving IV contrast, you may experience a mild flushed feeling. This is normal. . On occasion, you may experience a mild rash up to 24 hours after the test. This is not dangerous. If this occurs, you can take Benadryl 25 mg and increase your fluid intake. . If you experience trouble breathing, this can be serious. If it is severe call 911 IMMEDIATELY. If it is mild, please call our office.   Once we have confirmed authorization from your insurance company, we will call you to set up a date and time for your test. Based on how quickly your insurance processes prior authorizations requests, please allow up to 4 weeks to be contacted for  scheduling your Cardiac CT appointment. Be advised that routine Cardiac CT appointments could be scheduled as many as 8 weeks after your provider has ordered it.  For non-scheduling related questions, please contact the cardiac imaging nurse navigator should you have any questions/concerns: Marchia Bond, Cardiac Imaging Nurse Navigator Burley Saver, Interim Cardiac Imaging Nurse  [place] [facility] Direct Office Dial: [phone]   For scheduling needs, including cancellations and rescheduling, please call Vivien Rota at [phone], option 3.        Signed, Freada Bergeron, MD  11/01/2019 12:07 PM    [place]

## 2019-11-01 ENCOUNTER — Ambulatory Visit (INDEPENDENT_AMBULATORY_CARE_PROVIDER_SITE_OTHER): Payer: No Typology Code available for payment source | Admitting: Cardiology

## 2019-11-01 ENCOUNTER — Encounter: Payer: Self-pay | Admitting: Cardiology

## 2019-11-01 ENCOUNTER — Other Ambulatory Visit: Payer: Self-pay | Admitting: Cardiology

## 2019-11-01 ENCOUNTER — Other Ambulatory Visit: Payer: Self-pay

## 2019-11-01 VITALS — BP 136/70 | HR 109 | Ht 69.0 in | Wt 244.0 lb

## 2019-11-01 DIAGNOSIS — I10 Essential (primary) hypertension: Secondary | ICD-10-CM | POA: Diagnosis not present

## 2019-11-01 DIAGNOSIS — I208 Other forms of angina pectoris: Secondary | ICD-10-CM

## 2019-11-01 DIAGNOSIS — E7800 Pure hypercholesterolemia, unspecified: Secondary | ICD-10-CM

## 2019-11-01 DIAGNOSIS — R072 Precordial pain: Secondary | ICD-10-CM

## 2019-11-01 DIAGNOSIS — I639 Cerebral infarction, unspecified: Secondary | ICD-10-CM

## 2019-11-01 MED ORDER — METOPROLOL TARTRATE 100 MG PO TABS: ORAL_TABLET | ORAL | 0 refills | Status: DC

## 2019-11-01 MED ORDER — ROSUVASTATIN CALCIUM 20 MG PO TABS: 20.0000 mg | ORAL_TABLET | Freq: Every day | ORAL | 3 refills | Status: DC

## 2019-11-01 MED ORDER — AMLODIPINE BESYLATE 5 MG PO TABS: 5.0000 mg | ORAL_TABLET | Freq: Every day | ORAL | 3 refills | Status: DC

## 2019-11-01 NOTE — Patient Instructions (Signed)
Medication Instructions:  Your physician has recommended you make the following change in your medication:   1. STOP: simvastatin  2. START: rosuvastatin (crestor) 20 mg tablet: Take 1 tablet by mouth once a day  3. START: amlodipine (norvasc) 5 mg tablet: Take 1 tablet by mouth once a day  *If you need a refill on your cardiac medications before your next appointment, please call your pharmacy*   Lab Work: None  If you have labs (blood work) drawn today and your tests are completely normal, you will receive your results only by: Marland Kitchen MyChart Message (if you have MyChart) OR . A paper copy in the mail If you have any lab test that is abnormal or we need to change your treatment, we will call you to review the results.   Testing/Procedures: Your physician has requested that you have cardiac CT.   Follow-Up: Follow up with Dr. Johney Frame on 12/12/19 at 10:40 AM   Other Instructions Your cardiac CT will be scheduled at one of the below locations:   [facility] [address] [place], [place] [address] [phone]  [place] [address] [place], [place] [address] [phone]  If scheduled at [facility], please arrive at the [facility] main entrance of [facility] 30 minutes prior to test start time. Proceed to the [facility] Radiology Department (first floor) to check-in and test prep.  If scheduled at [facility], please arrive 15 mins early for check-in and test prep.  Please follow these instructions carefully (unless otherwise directed):  Hold all erectile dysfunction medications at least 3 days (72 hrs) prior to test.  On the Night Before the Test: . Be sure to Drink plenty of water. . Do not consume any caffeinated/decaffeinated beverages or chocolate 12 hours prior to your test. . Do not take any antihistamines 12 hours prior to your test.   On the  Day of the Test: . Drink plenty of water. Do not drink any water within one hour of the test. . Do not eat any food 4 hours prior to the test. . You may take your regular medications prior to the test.  . Take your labetalol and metoprolol (Lopressor) 100 MG two hours prior to test. . FEMALES- please wear underwire-free bra if available     After the Test: . Drink plenty of water. . After receiving IV contrast, you may experience a mild flushed feeling. This is normal. . On occasion, you may experience a mild rash up to 24 hours after the test. This is not dangerous. If this occurs, you can take Benadryl 25 mg and increase your fluid intake. . If you experience trouble breathing, this can be serious. If it is severe call 911 IMMEDIATELY. If it is mild, please call our office.   Once we have confirmed authorization from your insurance company, we will call you to set up a date and time for your test. Based on how quickly your insurance processes prior authorizations requests, please allow up to 4 weeks to be contacted for scheduling your Cardiac CT appointment. Be advised that routine Cardiac CT appointments could be scheduled as many as 8 weeks after your provider has ordered it.  For non-scheduling related questions, please contact the cardiac imaging nurse navigator should you have any questions/concerns: Marchia Bond, Cardiac Imaging Nurse Navigator Burley Saver, Interim Cardiac Imaging Nurse Navigator [facility] Direct Office Dial:  [phone]   For scheduling needs, including cancellations and rescheduling, please call Vivien Rota at [phone], option 3.

## 2019-11-06 ENCOUNTER — Other Ambulatory Visit: Payer: Self-pay | Admitting: Internal Medicine

## 2019-11-07 ENCOUNTER — Other Ambulatory Visit: Payer: Self-pay | Admitting: Internal Medicine

## 2019-11-07 NOTE — Telephone Encounter (Signed)
Last filled 10/09/2019... please advise

## 2019-11-08 ENCOUNTER — Other Ambulatory Visit: Payer: Self-pay | Admitting: Internal Medicine

## 2019-11-08 MED ORDER — ZOLPIDEM TARTRATE ER 12.5 MG PO TBCR: 12.5000 mg | EXTENDED_RELEASE_TABLET | Freq: Every day | ORAL | 0 refills | Status: DC

## 2019-11-08 NOTE — Telephone Encounter (Signed)
This is a duplicate request

## 2019-11-10 ENCOUNTER — Other Ambulatory Visit: Payer: Self-pay | Admitting: Internal Medicine

## 2019-11-17 ENCOUNTER — Telehealth (HOSPITAL_COMMUNITY): Payer: Self-pay | Admitting: *Deleted

## 2019-11-17 NOTE — Telephone Encounter (Signed)
Attempted to call patient regarding upcoming cardiac CT appointment. °Left message on voicemail with name and callback number ° °Faiga Stones Tai RN Navigator Cardiac Imaging °[facility] °[phone] Office °[phone] Cell ° °

## 2019-11-17 NOTE — Telephone Encounter (Signed)
Pt returning call regarding upcoming cardiac imaging study; pt verbalizes understanding of appt date/time, parking situation and where to check in, pre-test NPO status and medications ordered, and verified current allergies; name and call back number provided for further questions should they arise  [place] [facility] [phone] office [phone] cell  Pt verbalized understanding of needing a driver for the appointment if pt takes his Klonopin prescription for the CT scan.

## 2019-11-20 ENCOUNTER — Ambulatory Visit (HOSPITAL_COMMUNITY)
Admission: RE | Admit: 2019-11-20 | Discharge: 2019-11-20 | Disposition: A | Discharge status: Home / Self Care | Payer: No Typology Code available for payment source | Source: Ambulatory Visit | Attending: Cardiology | Admitting: Cardiology

## 2019-11-20 ENCOUNTER — Other Ambulatory Visit: Payer: Self-pay

## 2019-11-20 DIAGNOSIS — R072 Precordial pain: Secondary | ICD-10-CM | POA: Insufficient documentation

## 2019-11-20 MED ORDER — NITROGLYCERIN 0.4 MG SL SUBL
0.8000 mg | SUBLINGUAL_TABLET | Freq: Once | SUBLINGUAL | Status: AC
  Administered 2019-11-20: 0.8 mg via SUBLINGUAL

## 2019-11-20 MED ORDER — NITROGLYCERIN 0.4 MG SL SUBL
SUBLINGUAL_TABLET | SUBLINGUAL | Status: AC
  Filled 2019-11-20: qty 2

## 2019-11-20 MED ORDER — METOPROLOL TARTRATE 5 MG/5ML IV SOLN
INTRAVENOUS | Status: AC
  Filled 2019-11-20: qty 15

## 2019-11-20 MED ORDER — DILTIAZEM HCL 25 MG/5ML IV SOLN
5.0000 mg | Freq: Once | INTRAVENOUS | Status: AC
  Administered 2019-11-20: 5 mg via INTRAVENOUS
  Filled 2019-11-20: qty 5

## 2019-11-20 MED ORDER — DILTIAZEM HCL 25 MG/5ML IV SOLN
INTRAVENOUS | Status: AC
  Filled 2019-11-20: qty 5

## 2019-11-20 MED ORDER — IOHEXOL 350 MG/ML SOLN
80.0000 mL | Freq: Once | INTRAVENOUS | Status: AC | PRN
  Administered 2019-11-20: 80 mL via INTRAVENOUS

## 2019-11-20 MED ORDER — SODIUM CHLORIDE 0.9 % IV BOLUS: 500.0000 mL | Freq: Once | INTRAVENOUS | Status: DC

## 2019-11-20 MED ORDER — METOPROLOL TARTRATE 5 MG/5ML IV SOLN
5.0000 mg | INTRAVENOUS | Status: DC | PRN
  Administered 2019-11-20 (×3): 5 mg via INTRAVENOUS

## 2019-11-20 NOTE — Progress Notes (Signed)
Pt states he feels better and is ready to go home. BP better and color back to normal. D/C home ambulatory. Awake and alert. In no distress

## 2019-11-20 NOTE — Progress Notes (Signed)
Pt states he is feeling better. Color back to normal. Eating and drinking well.

## 2019-11-20 NOTE — Progress Notes (Signed)
CT scan completed. Pt states he does not feel good. BP low, color pale. 562ml NS started.

## 2019-11-22 ENCOUNTER — Telehealth: Payer: Self-pay | Admitting: Cardiology

## 2019-11-22 NOTE — Telephone Encounter (Signed)
Reviewed results with patient who verbalized understanding.   The patient understands to continue current medications. Lifestyle modification strategies with diet and exercise reviewed. He was grateful for call and agrees with treatment plan.

## 2019-11-22 NOTE — Telephone Encounter (Signed)
    Pt is returning call to get CT result 

## 2019-11-22 NOTE — Telephone Encounter (Signed)
-----   Message from Freada Bergeron, MD sent at 11/21/2019  8:25 PM EDT ----- CTA reveals that he has some minor plaque in the arteries but nothing that blocks or would cause chest pain. We will continue his current medications including ASA and statin as these will help prevent the plaque from getting worse. I look forward to seeing him soon.

## 2019-12-04 ENCOUNTER — Other Ambulatory Visit: Payer: Self-pay | Admitting: Internal Medicine

## 2019-12-04 NOTE — Telephone Encounter (Signed)
Last filled 10/23/2019... please advise

## 2019-12-05 ENCOUNTER — Other Ambulatory Visit: Payer: Self-pay | Admitting: Internal Medicine

## 2019-12-06 ENCOUNTER — Other Ambulatory Visit (HOSPITAL_COMMUNITY): Payer: Self-pay | Admitting: Student

## 2019-12-10 NOTE — Progress Notes (Signed)
Cardiology Office Note:    Date:  12/12/2019   ID:  [name], DOB 1970-12-16, MRN [id number]  PCP:  [name], NP  [facility] Cardiologist:  Freada Bergeron, MD  [facility] Electrophysiologist:  None   Referring MD: [name], NP     History of Present Illness:    [name] is a 49 y.o. male with a hx of HTN, prior CVA and GERD who returns to clinic for further management of HTN and shortness of breath on exertion.   Since his last visit with me in 11/01/19, the patient has undergone CTA for concern for exertional chest pain. Imaging showed nonobstructive disease and mildly dilated aorta (4.1cm). He states that he continues to feel SOB with exertion. Has some palpitations and notes his HR has been intermittently very elevated mainly with exertion. BP mainly 140-160s at home. Has occasional lightheadedness especially after eating as well as overall fatigue. He feels like his SOB has not gotten better and he can occasionally feel SOB at rest. No LE edema, orthopnea, PND.  Family history notable for coronary artery disease and had possible MI. Mother with cirrhosis.   TTE 2019 with normal EF (55-60%), Grade II diastolic dysfunction. Carotid ultrasound 2019: bilateral mild plaquing 1-39%.  Past Medical History:  Diagnosis Date  . Anxiety    takes Xanax daily prn, panic attacks  . Chronic gastric ulcer with bleeding s/p suture repair 2012 09/29/2010   Head 3 EGD which failed to stop the bleeding. Had exploratory laparotomy on 10/04/2010. Possible cause of the gastric ulcer was NSAID use.   . Constipation    related to pain meds  . Depression   . Dizziness    occasionally  . Dyspnea    walking a bit  . Erosive esophagitis   . External hemorrhoid, bleeding   . Gastric ulcer    history of  . GERD (gastroesophageal reflux disease)    takes Protonix bid  . Hiatal hernia   . History of blood clots 2012   in abdomen  . History of blood transfusion  2012  . History of blood transfusion   . Incisional hernia s/p open repair w mesh Aug 2013 05/26/2011  . Joint pain    knees  . Nocturia    depends on amount of fluid he drinks  . Pneumonia 2018  . Pre-diabetes   . Primary localized osteoarthritis of left hip 04/24/2014  . Serrated polyp of colon   . Sleep apnea    no cpap  mild no cpap needed  . Stroke ([place])   . Upper GI bleed   . Ventral hernia     Past Surgical History:  Procedure Laterality Date  . ANTERIOR CERVICAL DECOMPRESSION/DISCECTOMY FUSION 4 LEVELS N/A 04/08/2018   Procedure: ACDF - C3-C4 - C4-C5 - C5-C6 - C6-C7;  Surgeon: Kary Kos, MD;  Location: [place] [facility];  Service: Neurosurgery;  Laterality: N/A;  . CARPAL TUNNEL RELEASE Left 04/03/2018  . COLONOSCOPY    . ESOPHAGEAL MANOMETRY N/A 10/19/2016   Procedure: ESOPHAGEAL MANOMETRY (EM);  Surgeon: Mauri Pole, MD;  Location: [facility];  Service: Endoscopy;  Laterality: N/A;  . ESOPHAGOGASTRODUODENOSCOPY    . ESOPHAGOGASTRODUODENOSCOPY N/A 01/22/2017   Procedure: ESOPHAGOGASTRODUODENOSCOPY (EGD);  Surgeon: [name], MD;  Location: [facility];  Service: General;  Laterality: N/A;  . INCISIONAL HERNIA REPAIR  09/04/2011   Procedure: HERNIA REPAIR INCISIONAL;  Surgeon: Madilyn Hook, DO;  Location: [place];  Service: General;  Laterality: N/A;  debridment calcified mass  . INSERTION OF MESH  09/04/2011   retrorectus ultrapro "30x30cm"  . INSERTION OF MESH N/A 01/22/2017   Procedure: INSERTION OF MESH;  Surgeon: [name], MD;  Location: [facility];  Service: General;  Laterality: N/A;  . NISSEN FUNDOPLICATION     Open [id number]  . POLYPECTOMY    . STOMACH SURGERY  10/05/2010   Oversewing of gastric ulcer.  Dr Brantley Stage  . TOTAL HIP ARTHROPLASTY Left 04/24/2014   Procedure: LEFT TOTAL HIP ARTHROPLASTY;  Surgeon: Marchia Bond, MD;  Location: [facility] [place];  Service: Orthopedics;  Laterality: Left;  . UPPER GASTROINTESTINAL ENDOSCOPY      Current Medications: Current Meds    Medication Sig  . acetaminophen (TYLENOL) 500 MG tablet Take 1,000 mg by mouth every 6 (six) hours as needed for mild pain or headache.  Marland Kitchen amLODipine (NORVASC) 10 MG tablet Take 1 tablet (10 mg total) by mouth daily.  Marland Kitchen aspirin EC 81 MG EC tablet Take 1 tablet (81 mg total) by mouth daily.  . clonazePAM (KLONOPIN) 0.5 MG tablet TAKE 1 TABLET (0.5 MG TOTAL) BY MOUTH AT BEDTIME.  . cyclobenzaprine (FLEXERIL) 10 MG tablet Take 1 tablet (10 mg total) by mouth 3 (three) times daily as needed for muscle spasms.  . fluticasone (FLONASE) 50 MCG/ACT nasal spray Place 2 sprays into both nostrils daily as needed for allergies.  Marland Kitchen gabapentin (NEURONTIN) 300 MG capsule TAKE 1 CAPSULE (300 MG TOTAL) BY MOUTH 3 (THREE) TIMES DAILY.  Marland Kitchen HYDROcodone-acetaminophen (NORCO) 10-325 MG tablet Take 1 tablet by mouth every 6 (six) hours as needed.  Marland Kitchen lisinopril (ZESTRIL) 20 MG tablet Take 1 tablet (20 mg total) by mouth daily.  . meloxicam (MOBIC) 7.5 MG tablet Take 7.5 mg by mouth 2 (two) times daily.  . nortriptyline (PAMELOR) 25 MG capsule TAKE 1 CAPSULE BY MOUTH AT BEDTIME  . ondansetron (ZOFRAN) 4 MG tablet TAKE 1 TABLET BY MOUTH EVERY 8 HOURS AS NEEDED FOR NAUSEA OR VOMITING.  . pantoprazole (PROTONIX) 40 MG tablet TAKE 1 TABLET (40 MG TOTAL) BY MOUTH 2 (TWO) TIMES DAILY.  . rosuvastatin (CRESTOR) 20 MG tablet Take 1 tablet (20 mg total) by mouth daily.  . Vitamin D, Ergocalciferol, (DRISDOL) 1.25 MG (50000 UNIT) CAPS capsule Take 1 capsule (50,000 Units total) by mouth every 7 (seven) days.  Marland Kitchen zolpidem (AMBIEN CR) 12.5 MG CR tablet Take 1 tablet (12.5 mg total) by mouth at bedtime.  . [DISCONTINUED] amLODipine (NORVASC) 5 MG tablet Take 1 tablet (5 mg total) by mouth daily.  . [DISCONTINUED] labetalol (NORMODYNE) 100 MG tablet Take 1 tablet (100 mg total) by mouth 3 (three) times daily.     Allergies:   Dilaudid [hydromorphone hcl], Hydromorphone hcl, Metoclopramide, Metoclopramide hcl, Nsaids, and Benadryl  [diphenhydramine]   Social History   Socioeconomic History  . Marital status: Single    Spouse name: Not on file  . Number of children: 1  . Years of education: 42  . Highest education level: High school graduate  Occupational History  . Occupation: Maintenance - [employer]  Tobacco Use  . Smoking status: Never Smoker  . Smokeless tobacco: Current User    Types: Chew  . Tobacco comment: occ-Chew  Vaping Use  . Vaping Use: Never used  Substance and Sexual Activity  . Alcohol use: Yes    Alcohol/week: 0.0 standard drinks    Comment: 09/04/11 "very seldom"  . Drug use: No  . Sexual activity: Yes  Other  Topics Concern  . Not on file  Social History Narrative   Lives with brother in a one story home.  Has one daughter.  Works in Theatre manager for [employer].  Education: high school.       Patient is left-handed.   Social Determinants of Health   Financial Resource Strain:   . Difficulty of Paying Living Expenses: Not on file  Food Insecurity:   . Worried About Charity fundraiser in the Last Year: Not on file  . Ran Out of Food in the Last Year: Not on file  Transportation Needs:   . Lack of Transportation (Medical): Not on file  . Lack of Transportation (Non-Medical): Not on file  Physical Activity:   . Days of Exercise per Week: Not on file  . Minutes of Exercise per Session: Not on file  Stress:   . Feeling of Stress : Not on file  Social Connections:   . Frequency of Communication with Friends and Family: Not on file  . Frequency of Social Gatherings with Friends and Family: Not on file  . Attends Religious Services: Not on file  . Active Member of Clubs or Organizations: Not on file  . Attends Archivist Meetings: Not on file  . Marital Status: Not on file     Family History: The patient's family history includes Diabetes in his father and mother; Healthy in his brother, brother, and sister; Heart disease in his father; Liver disease in his mother.  There is no history of Colon cancer, Stomach cancer, Rectal cancer, Esophageal cancer, or Colon polyps.  ROS:   Please see the history of present illness.    Review of Systems  Constitutional: Positive for malaise/fatigue. Negative for chills and fever.  HENT: Negative for hearing loss.   Eyes: Negative for blurred vision.  Respiratory: Positive for shortness of breath. Negative for cough.   Cardiovascular: Positive for palpitations. Negative for chest pain, orthopnea, claudication, leg swelling and PND.  Gastrointestinal: Negative for heartburn, melena, nausea and vomiting.  Genitourinary: Negative for hematuria.  Musculoskeletal: Positive for myalgias.  Neurological: Positive for dizziness. Negative for loss of consciousness.  Endo/Heme/Allergies: Negative for polydipsia.  Psychiatric/Behavioral: Negative for substance abuse.     EKGs/Labs/Other Studies Reviewed:    The following studies were reviewed today: TTE May 17, 2017: Left ventricle: The cavity size was normal. Wall thickness was  normal. Systolic function was normal. The estimated ejection  fraction was in the range of 55% to 60%. Wall motion was normal;  there were no regional wall motion abnormalities. Features are  consistent with a pseudonormal left ventricular filling pattern,  with concomitant abnormal relaxation and increased filling pressure  (grade 2 diastolic dysfunction).   -------------------------------------------------------------------  Aortic valve:  Structurally normal valve.  Cusp separation was  normal. Doppler: Transvalvular velocity was within the normal  range. There was no stenosis. There was no regurgitation.   -------------------------------------------------------------------  Aorta: Aortic root: The aortic root was normal in size.  Ascending aorta: The ascending aorta was normal in size.   -------------------------------------------------------------------  Mitral valve:  Structurally  normal valve.  Leaflet separation was  normal. Doppler: Transvalvular velocity was within the normal  range. There was no evidence for stenosis. There was trivial  regurgitation.  Valve area by pressure half-time: 4.89 cm^2.  Indexed valve area by pressure half-time: 2.29 cm^2/m^2.  Peak  gradient (D): 2 mm Hg.   -------------------------------------------------------------------  Left atrium: The atrium was normal in size.   -------------------------------------------------------------------  Right ventricle:  The cavity size was normal. Systolic function was  normal.   -------------------------------------------------------------------  Pulmonic valve:  The valve appears to be grossly normal.  Doppler: There was trivial regurgitation.   -------------------------------------------------------------------  Tricuspid valve:  Structurally normal valve.  Leaflet separation  was normal. Doppler: Transvalvular velocity was within the normal  range. There was no significant regurgitation.   -------------------------------------------------------------------  Right atrium: The atrium was normal in size.   -------------------------------------------------------------------  Pericardium: There was no pericardial effusion.   Carotid ultrasound 10/07/19: Final Interpretation:  Right Carotid: Velocities in the right ICA are consistent with a 1-39%  stenosis.  Left Carotid: Velocities in the left ICA are consistent with a 1-39%  stenosis.    CTA coronary 11/20/19: Coronary calcium score: The patient's coronary artery calcium score is 41, which places the patient in the 83rd percentile.  Coronary arteries: Normal coronary origins.  Right dominance.  Right Coronary Artery: Dominant.  Normal.  Left Main Coronary Artery: Normal. Bifurcates into the LAD and LCx arteries.  Left Anterior Descending Coronary Artery: Extends around the apex. Minimal mixed ostial 1-24%  stenosis (CADRADS1). Mild eccentric non-calcified mid-LAD stenosis 24-49% (CADRADS2). Moderate size D1 vessel without disease. Smaller D2 vessel without disease.  Left Circumflex Artery: Large AV groove vessel that coarses down the lateral wall. Normal. Several small OM branches without disease.  Aorta: Dilated aortic root measuring 4.1 cm at the sinus of valsalva. No calcifications. No dissection.  Aortic Valve: Trileaflet.  No calcifications.  Other findings:  Normal pulmonary vein drainage into the left atrium.  Normal left atrial appendage without a thrombus.  Normal size of the pulmonary artery.  IMPRESSION: 1. Mild mixed non-obstructive CAD, CADRADS = 2.  2. Coronary calcium score of 41. This was 83rd percentile for age and sex matched control. Calcium noted in the LAD.  3. Normal coronary origin with right dominance.  4. Dilated aortic root measuring 4.1 cm at the sinus of Valsalva.  5. Aggressive cardiovascular risk factor modification is recommended.   Recent Labs: 08/02/2019: ALT 38; BUN 15; Creatinine, Ser 1.33; Hemoglobin 14.4; Platelets 323.0; Potassium 4.2; Sodium 142  Recent Lipid Panel    Component Value Date/Time   CHOL 177 08/02/2019 1637   TRIG 128.0 08/02/2019 1637   HDL 65.30 08/02/2019 1637   CHOLHDL 3 08/02/2019 1637   VLDL 25.6 08/02/2019 1637   LDLCALC 86 08/02/2019 1637      Physical Exam:    VS:  BP 128/82   Pulse (!) 106   Ht 5\' 7"  (1.702 m)   Wt 244 lb (110.7 kg)   SpO2 99%   BMI 38.22 kg/m     Wt Readings from Last 3 Encounters:  12/12/19 244 lb (110.7 kg)  11/01/19 244 lb (110.7 kg)  08/17/19 238 lb (108 kg)     GEN:  Well nourished, well developed in no acute distress HEENT: Normal NECK: No JVD; No carotid bruits CARDIAC: Tachycardic, regular, no murmurs, rubs, gallops RESPIRATORY:  Clear to auscultation without rales, wheezing or rhonchi  ABDOMEN: Soft, non-tender, non-distended MUSCULOSKELETAL:  No  edema; No deformity  SKIN: Warm and dry NEUROLOGIC:  Alert and oriented x 3 PSYCHIATRIC:  Normal affect   ASSESSMENT:    1. Dyspnea on exertion   2. Essential hypertension   3. Stable angina pectoris ([place])   4. Pure hypercholesterolemia   5. Cerebrovascular accident (CVA), unspecified mechanism ([place])   6. Palpitations    PLAN:    In order of problems listed above:  #Dyspnea  on Exertion: Patient with continued dyspnea on exertion with associated palpitations. CTA coronaries with mild, non-obstructive disease. TTE in 2019 with normal EF, grade 2 DD, and no significant valvular abnormalities. May be related to poorly controlled HTN at home in the setting of diastolic dysfunction and deconditioning and 40lbs weight gain over past year. Notably tachycardic (appears relatively new) so would want to ensure symptoms are not related to PE although suspicion is low as no recent periods of immobility, known hypercoaguable state, or surgery.  -CTA coronaries without obstructive disease -Check d-dimer given SOB +persistent sinus tachcyardia -Aggressive blood pressure control   #Palpitations: Patient with the sensation that his heart is racing with occasional associated lightheadedness. Given episodes of SOB and palpitations, want to rule-out underlying arrhythmias as cause of symptoms as well as work-up dyspnea as above. -Check 7d zio -Check TSH -Work-up dsypnea  #Non-obstructive CAD: Calcium score 41 with nonobstructive, mild CAD on CTA.  -Continue rosuvastatin 20mg  daily, ASA 81mg  daily -Continue diet and exercise modifications  #Hypertension: Elevated at home. -Increase amlodipine to 10mg  daily -Stop labetolol as hard to take afternoon dose -Start coreg 3.125mg  BID; up-titrate as needed -Continue lisinopril 20mg  daily -Repeat BMET today as last Cr elevated at 1.3  #History of CVA: Had CVA with right sided weakness and facial numbness in 2019. No residual deficits. 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 Managed by PCP. -Continue Crestor 20mg  daily    Shared Decision Making/Informed Consent     Medication Adjustments/Labs and Tests Ordered: Current medicines are reviewed at length with the patient today.  Concerns regarding medicines are outlined above.  Orders Placed This Encounter  Procedures  . Basic metabolic panel  . TSH  . LONG TERM MONITOR (3-14 DAYS)   Meds ordered this encounter  Medications  . amLODipine (NORVASC) 10 MG tablet    Sig: Take 1 tablet (10 mg total) by mouth daily.    Dispense:  90 tablet    Refill:  3  . carvedilol (COREG) 3.125 MG tablet    Sig: Take 1 tablet (3.125 mg total) by mouth 2 (two) times daily.    Dispense:  180 tablet    Refill:  3    Patient Instructions  Medication Instructions:  Your physician has recommended you make the following change in your medication:   INCREASE: Amlodipine 10mg  daily STOP: Labetalol START: Carvedilol 3.125mg  twice daily  *If you need a refill on your cardiac medications before your next appointment, please call your pharmacy*   Lab Work: TODAY: BMET, TSH, D-DIMER  If you have labs (blood work) drawn today and your tests are completely normal, you will receive your results only by: Marland Kitchen MyChart Message (if you have MyChart) OR . A paper copy in the mail If you have any lab test that is abnormal or we need to change your treatment, we will call you to review the results.   Testing/Procedures: Your physician has recommended that you wear a zio monitor. These monitors are medical devices that record the heart's electrical activity. Doctors most often use these monitors to diagnose arrhythmias. Arrhythmias are problems with the speed or rhythm of the heartbeat. The monitor is a small, portable device. You can wear one  while you do your normal daily activities. This is usually used to diagnose what is causing palpitations/syncope (passing out).   Follow-Up: At [facility], you  and your health needs are our priority.  As part of our continuing mission to provide you with exceptional heart care, we have created designated Provider Care Teams.  These Care Teams include your primary Cardiologist (physician) and Advanced Practice Providers (APPs -  Physician Assistants and Nurse Practitioners) who all work together to provide you with the care you need, when you need it.  We recommend signing up for the patient portal called "MyChart".  Sign up information is provided on this After Visit Summary.  MyChart is used to connect with patients for Virtual Visits (Telemedicine).  Patients are able to view lab/test results, encounter notes, upcoming appointments, etc.  Non-urgent messages can be sent to your provider as well.   To learn more about what you can do with MyChart, go to [email or web address].    Your next appointment:   03/13/2020  The format for your next appointment:   In Person  Provider:   Gwyndolyn [name], MD   Other Instructions [facility]- Long Term Monitor Instructions   Your physician has requested you wear your ZIO patch monitor___7____days.   This is a single patch monitor.  [employer] supplies one patch monitor per enrollment.  Additional stickers are not available.   Please do not apply patch if you will be having a Nuclear Stress Test, Echocardiogram, Cardiac CT, MRI, or Chest Xray during the time frame you would be wearing the monitor. The patch cannot be worn during these tests.  You cannot remove and re-apply the ZIO XT patch monitor.   Your ZIO patch monitor will be sent USPS Priority mail from [facility] directly to your home address. The monitor may also be mailed to a PO BOX if home delivery is not available.   It may take 3-5 days to receive your monitor after you have been  enrolled.   Once you have received you monitor, please review enclosed instructions.  Your monitor has already been registered assigning a specific monitor serial # to you.   Applying the monitor   Shave hair from upper left chest.   Hold abrader disc by orange tab.  Rub abrader in 40 strokes over left upper chest as indicated in your monitor instructions.   Clean area with 4 enclosed alcohol pads .  Use all pads to assure are is cleaned thoroughly.  Let dry.   Apply patch as indicated in monitor instructions.  Patch will be place under collarbone on left side of chest with arrow pointing upward.   Rub patch adhesive wings for 2 minutes.Remove white label marked "1".  Remove white label marked "2".  Rub patch adhesive wings for 2 additional minutes.   While looking in a mirror, press and release button in center of patch.  A small Bruce light will flash 3-4 times .  This will be your only indicator the monitor has been turned on.     Do not shower for the first 24 hours.  You may shower after the first 24 hours.   Press button if you feel a symptom. You will hear a small click.  Record Date, Time and Symptom in the Patient Log Book.   When you are ready to remove patch, follow instructions on last 2 pages of Patient Log Book.  Stick patch monitor onto last page of Patient Log Book.   Place Patient Log Book in [place] box.  Use locking tab on box and tape box closed securely.  The [employer] has [employer] on it.  Please place in mailbox as soon as possible.  Your physician should have your test results approximately 7 days after the monitor has been mailed back to [facility].   Call [facility] at [phone] if you have questions regarding your ZIO XT patch monitor.  Call them immediately if you see an orange light blinking on your monitor.   If your monitor falls off in less than 4 days contact our Monitor department at [phone].  If your monitor  becomes loose or falls off after 4 days call [employer] at [phone] for suggestions on securing your monitor.       Signed, Freada Bergeron, MD  12/12/2019 11:27 AM    [place]

## 2019-12-12 ENCOUNTER — Encounter: Payer: Self-pay | Admitting: Cardiology

## 2019-12-12 ENCOUNTER — Other Ambulatory Visit: Payer: Self-pay

## 2019-12-12 ENCOUNTER — Other Ambulatory Visit: Payer: Self-pay | Admitting: Cardiology

## 2019-12-12 ENCOUNTER — Ambulatory Visit: Payer: No Typology Code available for payment source | Admitting: Cardiology

## 2019-12-12 VITALS — BP 128/82 | HR 106 | Ht 67.0 in | Wt 244.0 lb

## 2019-12-12 DIAGNOSIS — E7800 Pure hypercholesterolemia, unspecified: Secondary | ICD-10-CM | POA: Diagnosis not present

## 2019-12-12 DIAGNOSIS — I208 Other forms of angina pectoris: Secondary | ICD-10-CM | POA: Diagnosis not present

## 2019-12-12 DIAGNOSIS — I10 Essential (primary) hypertension: Secondary | ICD-10-CM

## 2019-12-12 DIAGNOSIS — I639 Cerebral infarction, unspecified: Secondary | ICD-10-CM

## 2019-12-12 DIAGNOSIS — R0609 Other forms of dyspnea: Secondary | ICD-10-CM

## 2019-12-12 DIAGNOSIS — R0600 Dyspnea, unspecified: Secondary | ICD-10-CM | POA: Diagnosis not present

## 2019-12-12 DIAGNOSIS — R002 Palpitations: Secondary | ICD-10-CM

## 2019-12-12 LAB — BASIC METABOLIC PANEL
BUN/Creatinine Ratio: 10 (ref 9–20)
BUN: 11 mg/dL (ref 6–24)
CO2: 25 mmol/L (ref 20–29)
Calcium: 10.5 mg/dL — ABNORMAL HIGH (ref 8.7–10.2)
Chloride: 101 mmol/L (ref 96–106)
Creatinine, Ser: 1.05 mg/dL (ref 0.76–1.27)
GFR calc Af Amer: 96 mL/min/{1.73_m2} (ref >59)
GFR calc non Af Amer: 83 mL/min/{1.73_m2} (ref >59)
Glucose: 128 mg/dL — ABNORMAL HIGH (ref 65–99)
Potassium: 4.8 mmol/L (ref 3.5–5.2)
Sodium: 141 mmol/L (ref 134–144)

## 2019-12-12 LAB — D-DIMER, QUANTITATIVE: D-DIMER: 0.47 mg/L FEU (ref 0.00–0.49)

## 2019-12-12 LAB — TSH: TSH: 1.98 u[IU]/mL (ref 0.450–4.500)

## 2019-12-12 MED ORDER — AMLODIPINE BESYLATE 10 MG PO TABS: 10.0000 mg | ORAL_TABLET | Freq: Every day | ORAL | 3 refills | Status: DC

## 2019-12-12 MED ORDER — CARVEDILOL 3.125 MG PO TABS: 3.1250 mg | ORAL_TABLET | Freq: Two times a day (BID) | ORAL | 3 refills | Status: DC

## 2019-12-12 NOTE — Patient Instructions (Signed)
Medication Instructions:  Your physician has recommended you make the following change in your medication:   INCREASE: Amlodipine 10mg  daily STOP: Labetalol START: Carvedilol 3.125mg  twice daily  *If you need a refill on your cardiac medications before your next appointment, please call your pharmacy*   Lab Work: TODAY: BMET, TSH, D-DIMER  If you have labs (blood work) drawn today and your tests are completely normal, you will receive your results only by: Marland Kitchen MyChart Message (if you have MyChart) OR . A paper copy in the mail If you have any lab test that is abnormal or we need to change your treatment, we will call you to review the results.   Testing/Procedures: Your physician has recommended that you wear a zio monitor. These monitors are medical devices that record the heart's electrical activity. Doctors most often use these monitors to diagnose arrhythmias. Arrhythmias are problems with the speed or rhythm of the heartbeat. The monitor is a small, portable device. You can wear one while you do your normal daily activities. This is usually used to diagnose what is causing palpitations/syncope (passing out).   Follow-Up: At [facility], you and your health needs are our priority.  As part of our continuing mission to provide you with exceptional heart care, we have created designated Provider Care Teams.  These Care Teams include your primary Cardiologist (physician) and Advanced Practice Providers (APPs -  Physician Assistants and Nurse Practitioners) who all work together to provide you with the care you need, when you need it.  We recommend signing up for the patient portal called "MyChart".  Sign up information is provided on this After Visit Summary.  MyChart is used to connect with patients for Virtual Visits (Telemedicine).  Patients are able to view lab/test results, encounter notes, upcoming appointments, etc.  Non-urgent messages can be sent to your provider as well.   To  learn more about what you can do with MyChart, go to [email or web address].    Your next appointment:   03/13/2020  The format for your next appointment:   In Person  Provider:   Gwyndolyn [name], MD   Other Instructions Bryn Gulling- Long Term Monitor Instructions   Your physician has requested you wear your ZIO patch monitor___7____days.   This is a single patch monitor.  [employer] supplies one patch monitor per enrollment.  Additional stickers are not available.   Please do not apply patch if you will be having a Nuclear Stress Test, Echocardiogram, Cardiac CT, MRI, or Chest Xray during the time frame you would be wearing the monitor. The patch cannot be worn during these tests.  You cannot remove and re-apply the ZIO XT patch monitor.   Your ZIO patch monitor will be sent USPS Priority mail from [facility] directly to your home address. The monitor may also be mailed to a PO BOX if home delivery is not available.   It may take 3-5 days to receive your monitor after you have been enrolled.   Once you have received you monitor, please review enclosed instructions.  Your monitor has already been registered assigning a specific monitor serial # to you.   Applying the monitor   Shave hair from upper left chest.   Hold abrader disc by orange tab.  Rub abrader in 40 strokes over left upper chest as indicated in your monitor instructions.   Clean area with 4 enclosed alcohol pads .  Use all pads to assure are is cleaned thoroughly.  Let dry.   Apply  patch as indicated in monitor instructions.  Patch will be place under collarbone on left side of chest with arrow pointing upward.   Rub patch adhesive wings for 2 minutes.Remove white label marked "1".  Remove white label marked "2".  Rub patch adhesive wings for 2 additional minutes.   While looking in a mirror, press and release button in center of patch.  A small green light will flash 3-4 times .  This will be your only indicator  the monitor has been turned on.     Do not shower for the first 24 hours.  You may shower after the first 24 hours.   Press button if you feel a symptom. You will hear a small click.  Record Date, Time and Symptom in the Patient Log Book.   When you are ready to remove patch, follow instructions on last 2 pages of Patient Log Book.  Stick patch monitor onto last page of Patient Log Book.   Place Patient Log Book in [place] box.  Use locking tab on box and tape box closed securely.  The [employer] has [employer] on it.  Please place in mailbox as soon as possible.  Your physician should have your test results approximately 7 days after the monitor has been mailed back to [facility].   Call [place] at [phone] if you have questions regarding your ZIO XT patch monitor.  Call them immediately if you see an orange light blinking on your monitor.   If your monitor falls off in less than 4 days contact our Monitor department at [phone].  If your monitor becomes loose or falls off after 4 days call [employer] at [phone] for suggestions on securing your monitor.

## 2019-12-12 NOTE — Addendum Note (Signed)
Addended by: Carylon Perches on: 12/12/2019 11:48 AM   Modules accepted: Orders

## 2019-12-27 ENCOUNTER — Other Ambulatory Visit: Payer: Self-pay | Admitting: Internal Medicine

## 2019-12-27 ENCOUNTER — Other Ambulatory Visit: Payer: Self-pay | Admitting: Neurology

## 2020-01-01 ENCOUNTER — Other Ambulatory Visit: Payer: Self-pay | Admitting: Neurology

## 2020-01-01 NOTE — Telephone Encounter (Signed)
Pt scheduled for 01/03/20 at 11:30 am

## 2020-01-01 NOTE — Telephone Encounter (Signed)
Can we work him in?  I really do not feel comfortable continuing refilling this medication if he hasn't been seen this long (1 1/2 years almost).  If he is doing well, he no longer needs to follow up and his PCP can continue refills going forward.

## 2020-01-02 ENCOUNTER — Other Ambulatory Visit: Payer: Self-pay | Admitting: Internal Medicine

## 2020-01-02 MED ORDER — CLONAZEPAM 0.5 MG PO TABS: 0.5000 mg | ORAL_TABLET | Freq: Every day | ORAL | 0 refills | Status: DC

## 2020-01-02 MED ORDER — ZOLPIDEM TARTRATE ER 12.5 MG PO TBCR: 12.5000 mg | EXTENDED_RELEASE_TABLET | Freq: Every day | ORAL | 0 refills | Status: DC

## 2020-01-02 NOTE — Telephone Encounter (Signed)
Please Advise

## 2020-01-02 NOTE — Progress Notes (Signed)
NEUROLOGY FOLLOW UP OFFICE NOTE  [name] [id number]   Subjective:  [name] is a 49 year old man who follows up for stroke, myelopathy and headache.  UPDATE: Current medications:  ASA 81mg , amlodipine, carvedilol, lisinopril, gabapentin 300mg  TID, nortriptyline 25mg , ondansetron 4mg , zolpidem 12.5mg   Headaches are improved.  They usually occur a couple of weekends a month.  He treats with Tylenol and headaches last 3 to 4 hours.    08/02/2019 Labs:  LDL 86, Hgb A1c 6.2  Has some numbness in the arms and hands which is residual from carpal tunnel syndrome.  He underwent surgery earlier this year.    HISTORY: 1.  Stroke: He was admitted to [facility] from 10/05/2017 to 10/07/2017 after presenting with vertigo. 2 days prior, he began experiencing sensation of room spinning with right arm numbness, paresthesias and weakness and right leg paresthesiasassociated with headache and photophobia. MRI of the brain revealed a probable punctate acute/early subacute ischemic infarct within the left thalamo-mesencephalic junction. MRA of the head showed no aneurysm or large vessel occlusion or stenosis. Carotid Doppler showed no hemodynamically significant extracranial stenosis. 2D echo demonstrated normal ejection fraction with no cardiac source of emboli. LDL was 76. Hemoglobin A1c was 5.9. He was started on Lipitor 40mg  daily.He was discharged on aspirin 81 mg and Plavix 75 mg daily for 3 weeks, followed by aspirin alone.  2.  Headache: Followingdischarge, he continued to have a constantsevereright-sided pressure-like headache involving the right temple and right side of face associated with right sided facial numbness and tingling. He also noted worsening slurred speech, right upper extremity numbness and weakness. He developed new blurred peripheral vision in his right eye. He also endorsed postural lightheadedness when standing but not vertigo. He presented to the  ED on 11/06/17 for further evaluation. Labs were unremarkable. CT of head demonstrated no acute findings. MRI of brain was normal with resolution of prior punctate focus. He was treated with a migraine cocktail and discharged. He was started on nortriptyline which has helped.  3.  Myelopathy: Following the stroke, he developednumbness and tingling in both arms and legsfor about a monthHe reports tightness and numbness and tingling in glove distribution up to elbows. He reports tightness between his shoulder blades but no significant neck pain or radicular pain down the arms. He also reports tightness and soreness in his hamstrings and his legs feel wobbly. He denies low back pain. It is a constant feeling. He reports aches and soreness in the arms and legs.Labs from 12/28/17 included TSH 2.30, B12 1,113, and elevated CK of 551. He was advised to stop atorvastatin but symptoms have persisted. Labs from 01/04/18 showed reduced CK of 306, CRP 0.1, vitamin D 29.69, negative Lyme IgG and IgM antibodies, and negative RMSF antibody. 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 Findings on testing thought to be incidental and not contributing to his symptoms. Repeat CK from February 2020 was 149.  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  He was referred to neurosurgery and underwent C3-C7 ACDF on 04/08/18.  PAST MEDICAL HISTORY: Past Medical History:  Diagnosis Date  . Anxiety    takes Xanax daily prn, panic attacks  . Chronic gastric ulcer with bleeding s/p suture repair 2012 09/29/2010   Head 3 EGD which failed to stop the bleeding. Had exploratory laparotomy on 10/04/2010. Possible cause of the gastric ulcer was NSAID use.   . Constipation    related to pain meds  . Depression   . Dizziness    occasionally  . Dyspnea    walking a bit  . Erosive esophagitis   . External hemorrhoid, bleeding   . Gastric ulcer    history of  . GERD (gastroesophageal reflux disease)    takes Protonix bid  . Hiatal hernia   . History of blood clots 2012   in abdomen  . History of blood transfusion 2012  . History of blood transfusion   . Incisional hernia s/p open repair w mesh Aug 2013 05/26/2011  . Joint pain    knees  . Nocturia    depends on amount of fluid he drinks  . Pneumonia 2018  . Pre-diabetes   . Primary localized osteoarthritis of left hip 04/24/2014  . Serrated polyp of colon   . Sleep apnea    no cpap  mild no cpap needed  . Stroke ([place])   . Upper GI bleed   . Ventral hernia     MEDICATIONS: Current Outpatient Medications on File Prior to Visit  Medication Sig Dispense Refill  . acetaminophen (TYLENOL) 500 MG tablet Take 1,000 mg by mouth every 6 (six) hours as needed for mild pain or headache.    Marland Kitchen amLODipine (NORVASC) 10 MG tablet Take 1 tablet (10 mg total) by mouth daily. 90 tablet 3  . aspirin EC 81 MG EC tablet Take 1 tablet (81 mg total) by mouth daily.    . carvedilol (COREG) 3.125 MG tablet Take 1 tablet (3.125 mg total) by mouth 2 (two) times daily. 180 tablet 3  . clonazePAM (KLONOPIN) 0.5 MG tablet TAKE 1 TABLET (0.5 MG TOTAL) BY MOUTH AT BEDTIME. 30 tablet 0  . cyclobenzaprine (FLEXERIL) 10 MG tablet Take 1 tablet (10 mg total) by mouth 3 (three) times daily  as needed for muscle spasms. 30 tablet 0  . fluticasone (FLONASE) 50 MCG/ACT nasal spray Place 2 sprays into both nostrils daily as needed for allergies. 16 g 11  . gabapentin (NEURONTIN) 300 MG capsule TAKE 1 CAPSULE (300 MG TOTAL) BY MOUTH 3 (THREE) TIMES DAILY. 90 capsule 2  . HYDROcodone-acetaminophen (NORCO) 10-325 MG tablet Take 1 tablet by mouth every 6 (six) hours as needed. 30 tablet 0  . lisinopril (ZESTRIL) 20 MG tablet TAKE 1 TABLET (20 MG TOTAL) BY MOUTH DAILY. 30 tablet 2  . meloxicam (MOBIC) 7.5 MG tablet Take 7.5 mg by mouth 2 (two) times daily.    . nortriptyline (PAMELOR) 25 MG capsule TAKE 1 CAPSULE BY MOUTH AT BEDTIME 30 capsule 0  . ondansetron (ZOFRAN) 4 MG tablet TAKE 1 TABLET BY MOUTH EVERY 8 HOURS AS NEEDED FOR NAUSEA OR VOMITING. 20 tablet 1  . pantoprazole (PROTONIX) 40 MG tablet TAKE 1 TABLET (40 MG TOTAL) BY MOUTH 2 (TWO) TIMES DAILY. 180 tablet 2  . rosuvastatin (CRESTOR) 20 MG tablet Take 1 tablet (20 mg total) by mouth daily. 90 tablet  3  . Vitamin D, Ergocalciferol, (DRISDOL) 1.25 MG (50000 UNIT) CAPS capsule Take 1 capsule (50,000 Units total) by mouth every 7 (seven) days. 12 capsule 0  . zolpidem (AMBIEN CR) 12.5 MG CR tablet Take 1 tablet (12.5 mg total) by mouth at bedtime. 30 tablet 0   No current facility-administered medications on file prior to visit.    ALLERGIES: Allergies  Allergen Reactions  . Dilaudid [Hydromorphone Hcl] Rash and Other (See Comments)    Shakey  . Hydromorphone Hcl Other (See Comments) and Rash    Shakey  . Metoclopramide Other (See Comments), Rash, Swelling and Hives    Swelling in lips  . Metoclopramide Hcl Hives, Swelling, Rash and Other (See Comments)    Swelling in lips  . Nsaids     H/o bleeding gastric ulcers  . Benadryl [Diphenhydramine]     Causes patient to become hyper and pace    FAMILY HISTORY: Family History  Problem Relation Age of Onset  . Diabetes Mother   . Liver disease Mother   . Diabetes Father     . Heart disease Father   . Healthy Sister   . Healthy Brother   . Healthy Brother   . Colon cancer Neg Hx   . Stomach cancer Neg Hx   . Rectal cancer Neg Hx   . Esophageal cancer Neg Hx   . Colon polyps Neg Hx     SOCIAL HISTORY: Social History   Socioeconomic History  . Marital status: Single    Spouse name: Not on file  . Number of children: 1  . Years of education: 49  . Highest education level: High school graduate  Occupational History  . Occupation: Maintenance - [employer]  Tobacco Use  . Smoking status: Never Smoker  . Smokeless tobacco: Current User    Types: Chew  . Tobacco comment: occ-Chew  Vaping Use  . Vaping Use: Never used  Substance and Sexual Activity  . Alcohol use: Yes    Alcohol/week: 0.0 standard drinks    Comment: 09/04/11 "very seldom"  . Drug use: No  . Sexual activity: Yes  Other Topics Concern  . Not on file  Social History Narrative   Lives with brother in a one story home.  Has one daughter.  Works in Theatre manager for [employer].  Education: high school.       Patient is left-handed.   Social Determinants of Health   Financial Resource Strain:   . Difficulty of Paying Living Expenses: Not on file  Food Insecurity:   . Worried About Charity fundraiser in the Last Year: Not on file  . Ran Out of Food in the Last Year: Not on file  Transportation Needs:   . Lack of Transportation (Medical): Not on file  . Lack of Transportation (Non-Medical): Not on file  Physical Activity:   . Days of Exercise per Week: Not on file  . Minutes of Exercise per Session: Not on file  Stress:   . Feeling of Stress : Not on file  Social Connections:   . Frequency of Communication with Friends and Family: Not on file  . Frequency of Social Gatherings with Friends and Family: Not on file  . Attends Religious Services: Not on file  . Active Member of Clubs or Organizations: Not on file  . Attends Archivist Meetings: Not on file  . Marital  Status: Not on file  Intimate Partner Violence:   . Fear of Current or  Ex-Partner: Not on file  . Emotionally Abused: Not on file  . Physically Abused: Not on file  . Sexually Abused: Not on file     Objective:  Blood pressure 129/85, pulse 98, height 5\' 9"  (1.753 m), weight 244 lb 9.6 oz (110.9 kg), SpO2 98 %. General: No acute distress.  Patient appears well-groomed.   Head:  Normocephalic/atraumatic Eyes:  Fundi examined but not visualized Neck: supple, no paraspinal tenderness, full range of motion Heart:  Regular rate and rhythm Lungs:  Clear to auscultation bilaterally Back: No paraspinal tenderness Neurological Exam: alert and oriented to person, place, and time. Attention span and concentration intact, recent and remote memory intact, fund of knowledge intact.  Speech fluent and not dysarthric, language intact.  CN II-XII intact. Bulk and tone normal, muscle strength 5/5 throughout.  Sensation to light touch, temperature and vibration intact.  Deep tendon reflexes 2+ throughout, toes downgoing.  Finger to nose and heel to shin testing intact.  Gait normal, Romberg negative.   Assessment/Plan:   1.  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 2.  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  Headache  1.  Headache prophylaxis:  He reports some trouble with elevated blood pressure.  As nortriptyline may contribute to elevated blood pressure, will discontinue and start Trokendi XR 50mg  at bedtime. 2.  Secondary stroke prevention as managed by PCP: -  ASA 81mg  daily -  Crestor.  LDL goal less than 70 -  Glycemic control.  Hgb A1c goal less than 7 -  Blood pressure control 3.  He is taking gabapentin per PCP for pain. 4.  Follow up 6 months.  Bruce Clines, DO  CC: Webb Silversmith, NP

## 2020-01-02 NOTE — Telephone Encounter (Signed)
To pcp

## 2020-01-03 ENCOUNTER — Other Ambulatory Visit: Payer: Self-pay

## 2020-01-03 ENCOUNTER — Ambulatory Visit (INDEPENDENT_AMBULATORY_CARE_PROVIDER_SITE_OTHER): Payer: No Typology Code available for payment source | Admitting: Neurology

## 2020-01-03 ENCOUNTER — Ambulatory Visit: Payer: No Typology Code available for payment source

## 2020-01-03 ENCOUNTER — Encounter: Payer: Self-pay | Admitting: Neurology

## 2020-01-03 ENCOUNTER — Other Ambulatory Visit (HOSPITAL_COMMUNITY): Payer: Self-pay | Admitting: Internal Medicine

## 2020-01-03 ENCOUNTER — Ambulatory Visit: Payer: No Typology Code available for payment source | Attending: Internal Medicine

## 2020-01-03 VITALS — BP 129/85 | HR 98 | Ht 69.0 in | Wt 244.6 lb

## 2020-01-03 DIAGNOSIS — R519 Headache, unspecified: Secondary | ICD-10-CM | POA: Diagnosis not present

## 2020-01-03 DIAGNOSIS — M4802 Spinal stenosis, cervical region: Secondary | ICD-10-CM

## 2020-01-03 DIAGNOSIS — I639 Cerebral infarction, unspecified: Secondary | ICD-10-CM | POA: Diagnosis not present

## 2020-01-03 DIAGNOSIS — Z23 Encounter for immunization: Secondary | ICD-10-CM

## 2020-01-03 DIAGNOSIS — G992 Myelopathy in diseases classified elsewhere: Secondary | ICD-10-CM | POA: Diagnosis not present

## 2020-01-03 MED ORDER — TROKENDI XR 50 MG PO CP24: 50.0000 mg | ORAL_CAPSULE | Freq: Every day | ORAL | 5 refills | Status: DC

## 2020-01-03 NOTE — Progress Notes (Signed)
   Covid-19 Vaccination Clinic  Name:  GRADIE OHM    MRN: [id number] DOB: 25-Sep-1970  01/03/2020  Mr. Scurlock was observed post Covid-19 immunization for 15 minutes without incident. He was provided with Vaccine Information Sheet and instruction to access the V-Safe system.   Mr. Berrios was instructed to call 911 with any severe reactions post vaccine: Marland Kitchen Difficulty breathing  . Swelling of face and throat  . A fast heartbeat  . A bad rash all over body  . Dizziness and weakness   Immunizations Administered    Name Date Dose VIS Date Route   Pfizer COVID-19 Vaccine 01/03/2020 10:19 AM 0.3 mL 11/22/2019 Intramuscular   Manufacturer: [place]   Lot: [id number]   [place]: [id number]

## 2020-01-03 NOTE — Patient Instructions (Addendum)
1.  Stop nortriptyline.  Instead, start Trokendi XR 50mg  at bedtime to help reduce headaches 2.  Continue stroke prevention:  Aspirin 81mg  daily, rosuvastatin, blood pressure control 3.  Follow up in 6 months.

## 2020-01-09 ENCOUNTER — Ambulatory Visit (INDEPENDENT_AMBULATORY_CARE_PROVIDER_SITE_OTHER): Payer: No Typology Code available for payment source

## 2020-01-09 DIAGNOSIS — R002 Palpitations: Secondary | ICD-10-CM

## 2020-01-29 ENCOUNTER — Other Ambulatory Visit: Payer: Self-pay | Admitting: Internal Medicine

## 2020-01-29 ENCOUNTER — Other Ambulatory Visit: Payer: Self-pay | Admitting: Neurology

## 2020-01-29 MED ORDER — CLONAZEPAM 0.5 MG PO TABS: 0.5000 mg | ORAL_TABLET | Freq: Every day | ORAL | 0 refills | Status: DC

## 2020-01-29 MED ORDER — ZOLPIDEM TARTRATE ER 12.5 MG PO TBCR: 12.5000 mg | EXTENDED_RELEASE_TABLET | Freq: Every day | ORAL | 0 refills | Status: DC

## 2020-01-29 NOTE — Telephone Encounter (Signed)
Last filled 01/02/2020... note to pharmacy [facility]F on or after 02/01/20... please advise

## 2020-02-08 ENCOUNTER — Other Ambulatory Visit: Payer: Self-pay | Admitting: Internal Medicine

## 2020-02-09 ENCOUNTER — Other Ambulatory Visit: Payer: Self-pay | Admitting: Internal Medicine

## 2020-02-29 ENCOUNTER — Other Ambulatory Visit: Payer: Self-pay | Admitting: Internal Medicine

## 2020-02-29 NOTE — Telephone Encounter (Signed)
Ambien and Klonopin last filled 02/01/2020... note To be filled on or after 03/02/2020  Please advise

## 2020-03-01 ENCOUNTER — Other Ambulatory Visit: Payer: Self-pay | Admitting: Internal Medicine

## 2020-03-10 NOTE — Progress Notes (Signed)
Cardiology Office Note:    Date:  03/13/2020   ID:  [name], DOB 1970/11/20, MRN ID:[id number]  PCP:  Bruce Fenton, NP  [facility] Cardiologist:  Bruce Bergeron, MD  [facility] Electrophysiologist:  None   Referring MD: Bruce Fenton, NP    History of Present Illness:    [name] is a 50 y.o. male with a hx of HTN, prior CVA and GERD who returns to clinic for further management of HTN and shortness of breath on exertion.   During our last visit on 12/12/19, the patient continued to feel SOB with exertion. Coronary CTA showed nonobstructive disease and mildly dilated aorta (4.1cm). We checked d-dimer which was normal. Cardiac monitor was placed for palpitation which showed average HR 103bpm and 3 episodes of SVT but no Afib, VT or significant pauses.   Patient states that he is continuing to feel short of breath. Symptoms are worse with exertion but can occur at rest. Overall unchanged from prior visits. No recent or prior episodes of COVID that he knows of. No chest pain, LE edema, orthopnea, or PND. No smoking or drinking alcohol. Blood pressure is well controlled. Has cut back on salt intake. He understands that his weight gain over the past couple years (40lbs) may be contributing to symptoms.  Family history notable for coronary artery disease and had possible MI. Mother with cirrhosis.  TTE 2019 with normal EF (55-60%), Grade II diastolic dysfunction. Carotid ultrasound 2019: bilateral mild plaquing 1-39%.  Past Medical History:  Diagnosis Date  . Anxiety    takes Xanax daily prn, panic attacks  . Chronic gastric ulcer with bleeding s/p suture repair 2012 09/29/2010   Head 3 EGD which failed to stop the bleeding. Had exploratory laparotomy on 10/04/2010. Possible cause of the gastric ulcer was NSAID use.   . Constipation    related to pain meds  . Depression   . Dizziness    occasionally  . Dyspnea    walking a bit  . Erosive esophagitis   .  External hemorrhoid, bleeding   . Gastric ulcer    history of  . GERD (gastroesophageal reflux disease)    takes Protonix bid  . Hiatal hernia   . History of blood clots 2012   in abdomen  . History of blood transfusion 2012  . History of blood transfusion   . Incisional hernia s/p open repair w mesh Aug 2013 05/26/2011  . Joint pain    knees  . Nocturia    depends on amount of fluid he drinks  . Pneumonia 2018  . Pre-diabetes   . Primary localized osteoarthritis of left hip 04/24/2014  . Serrated polyp of colon   . Sleep apnea    no cpap  mild no cpap needed  . Stroke ([place])   . Upper GI bleed   . Ventral hernia     Past Surgical History:  Procedure Laterality Date  . ANTERIOR CERVICAL DECOMPRESSION/DISCECTOMY FUSION 4 LEVELS N/A 04/08/2018   Procedure: ACDF - C3-C4 - C4-C5 - C5-C6 - C6-C7;  Surgeon: Bruce Kos, MD;  Location: [facility];  Service: Neurosurgery;  Laterality: N/A;  . CARPAL TUNNEL RELEASE Left 04/03/2018  . COLONOSCOPY    . ESOPHAGEAL MANOMETRY N/A 10/19/2016   Procedure: ESOPHAGEAL MANOMETRY (EM);  Surgeon: Bruce Pole, MD;  Location: [facility];  Service: Endoscopy;  Laterality: N/A;  . ESOPHAGOGASTRODUODENOSCOPY    . ESOPHAGOGASTRODUODENOSCOPY N/A 01/22/2017   Procedure: ESOPHAGOGASTRODUODENOSCOPY (EGD);  Surgeon: Bruce Boston, MD;  Location: [facility];  Service: General;  Laterality: N/A;  . INCISIONAL HERNIA REPAIR  09/04/2011   Procedure: HERNIA REPAIR INCISIONAL;  Surgeon: Bruce Hook, DO;  Location: [facility];  Service: General;  Laterality: N/A;  debridment calcified mass  . INSERTION OF MESH  09/04/2011   retrorectus ultrapro "30x30cm"  . INSERTION OF MESH N/A 01/22/2017   Procedure: INSERTION OF MESH;  Surgeon: Bruce Boston, MD;  Location: [facility];  Service: General;  Laterality: N/A;  . NISSEN FUNDOPLICATION     Open [id number]  . POLYPECTOMY    . STOMACH SURGERY  10/05/2010   Oversewing of gastric ulcer.  Dr [name]  . TOTAL HIP ARTHROPLASTY Left  04/24/2014   Procedure: LEFT TOTAL HIP ARTHROPLASTY;  Surgeon: Bruce Bond, MD;  Location: [facility];  Service: Orthopedics;  Laterality: Left;  . UPPER GASTROINTESTINAL ENDOSCOPY      Current Medications: Current Meds  Medication Sig  . acetaminophen (TYLENOL) 500 MG tablet Take 1,000 mg by mouth every 6 (six) hours as needed for mild pain or headache.  Marland Kitchen amLODipine (NORVASC) 10 MG tablet Take 1 tablet (10 mg total) by mouth daily.  Marland Kitchen aspirin EC 81 MG EC tablet Take 1 tablet (81 mg total) by mouth daily.  . carvedilol (COREG) 3.125 MG tablet Take 1 tablet (3.125 mg total) by mouth 2 (two) times daily.  . clonazePAM (KLONOPIN) 0.5 MG tablet Take 1 tablet (0.5 mg total) by mouth at bedtime.  . cyclobenzaprine (FLEXERIL) 10 MG tablet TAKE 1 TABLET BY MOUTH 3 TIMES EVERY DAY AS NEEDED FOR MUSCLE SPASMS  . fluticasone (FLONASE) 50 MCG/ACT nasal spray Place 2 sprays into both nostrils daily as needed for allergies.  Marland Kitchen gabapentin (NEURONTIN) 300 MG capsule TAKE 1 CAPSULE (300 MG TOTAL) BY MOUTH 3 (THREE) TIMES DAILY.  Marland Kitchen HYDROcodone-acetaminophen (NORCO) 10-325 MG tablet Take 1 tablet by mouth every 6 (six) hours as needed.  Marland Kitchen lisinopril (ZESTRIL) 20 MG tablet TAKE 1 TABLET (20 MG TOTAL) BY MOUTH DAILY.  . meloxicam (MOBIC) 7.5 MG tablet Take 7.5 mg by mouth 2 (two) times daily.  . ondansetron (ZOFRAN) 4 MG tablet TAKE 1 TABLET BY MOUTH EVERY 8 HOURS AS NEEDED FOR NAUSEA OR VOMITING.  . pantoprazole (PROTONIX) 40 MG tablet TAKE 1 TABLET (40 MG TOTAL) BY MOUTH 2 (TWO) TIMES DAILY.  . rosuvastatin (CRESTOR) 20 MG tablet Take 1 tablet (20 mg total) by mouth daily.  . Topiramate ER (TROKENDI XR) 50 MG CP24 Take 50 mg by mouth at bedtime.  . Vitamin D, Ergocalciferol, (DRISDOL) 1.25 MG (50000 UNIT) CAPS capsule Take 1 capsule (50,000 Units total) by mouth every 7 (seven) days.  Marland Kitchen zolpidem (AMBIEN CR) 12.5 MG CR tablet Take 1 tablet (12.5 mg total) by mouth at bedtime.     Allergies:   Dilaudid  [hydromorphone hcl], Hydromorphone hcl, Metoclopramide, Metoclopramide hcl, Nsaids, and Benadryl [diphenhydramine]   Social History   Socioeconomic History  . Marital status: Single    Spouse name: Not on file  . Number of children: 1  . Years of education: 4  . Highest education level: High school graduate  Occupational History  . Occupation: Maintenance - [employer]  Tobacco Use  . Smoking status: Never Smoker  . Smokeless tobacco: Current User    Types: Chew  . Tobacco comment: occ-Chew  Vaping Use  . Vaping Use: Never used  Substance and Sexual Activity  . Alcohol use: Yes    Alcohol/week: 0.0 standard  drinks    Comment: 09/04/11 "very seldom"  . Drug use: No  . Sexual activity: Yes  Other Topics Concern  . Not on file  Social History Narrative   Lives with brother in a one story home.  Has one daughter.  Works in Theatre manager for [employer].  Education: high school.       Patient is left-handed.   Social Determinants of Health   Financial Resource Strain: Not on file  Food Insecurity: Not on file  Transportation Needs: Not on file  Physical Activity: Not on file  Stress: Not on file  Social Connections: Not on file     Family History: The patient's family history includes Diabetes in his father and mother; Healthy in his brother, brother, and sister; Heart disease in his father; Liver disease in his mother. There is no history of Colon cancer, Stomach cancer, Rectal cancer, Esophageal cancer, or Colon polyps.  ROS:   Please see the history of present illness.    Review of Systems  Constitutional: Negative for chills and fever.  HENT: Negative for nosebleeds.   Eyes: Negative for blurred vision and redness.  Respiratory: Positive for shortness of breath.   Cardiovascular: Negative for chest pain, palpitations, orthopnea, claudication, leg swelling and PND.  Gastrointestinal: Negative for nausea and vomiting.  Genitourinary: Negative for hematuria and urgency.   Musculoskeletal: Positive for joint pain.  Neurological: Positive for tingling. Negative for dizziness and loss of consciousness.  Endo/Heme/Allergies: Negative for polydipsia.  Psychiatric/Behavioral: Negative for substance abuse.    EKGs/Labs/Other Studies Reviewed:    The following studies were reviewed today: TTE 2017/05/07: Left ventricle: The cavity size was normal. Wall thickness was  normal. Systolic function was normal. The estimated ejection  fraction was in the range of 55% to 60%. Wall motion was normal;  there were no regional wall motion abnormalities. Features are  consistent with a pseudonormal left ventricular filling pattern,  with concomitant abnormal relaxation and increased filling pressure  (grade 2 diastolic dysfunction).   -------------------------------------------------------------------  Aortic valve:  Structurally normal valve.  Cusp separation was  normal. Doppler: Transvalvular velocity was within the normal  range. There was no stenosis. There was no regurgitation.   -------------------------------------------------------------------  Aorta: Aortic root: The aortic root was normal in size.  Ascending aorta: The ascending aorta was normal in size.   -------------------------------------------------------------------  Mitral valve:  Structurally normal valve.  Leaflet separation was  normal. Doppler: Transvalvular velocity was within the normal  range. There was no evidence for stenosis. There was trivial  regurgitation.  Valve area by pressure half-time: 4.89 cm^2.  Indexed valve area by pressure half-time: 2.29 cm^2/m^2.  Peak  gradient (D): 2 mm Hg.   -------------------------------------------------------------------  Left atrium: The atrium was normal in size.   -------------------------------------------------------------------  Right ventricle: The cavity size was normal. Systolic function was  normal.    -------------------------------------------------------------------  Pulmonic valve:  The valve appears to be grossly normal.  Doppler: There was trivial regurgitation.   -------------------------------------------------------------------  Tricuspid valve:  Structurally normal valve.  Leaflet separation  was normal. Doppler: Transvalvular velocity was within the normal  range. There was no significant regurgitation.   -------------------------------------------------------------------  Right atrium: The atrium was normal in size.   -------------------------------------------------------------------  Pericardium: There was no pericardial effusion.  Carotid ultrasound 10/07/19: Final Interpretation:  Right Carotid: Velocities in the right ICA are consistent with a 1-39%  stenosis.  Left Carotid: Velocities in the left ICA are consistent with a 1-39%  stenosis.  CTA coronary 11/20/19: Coronary calcium score: The patient's coronary artery calcium score is 41, which places the patient in the 83rd percentile.  Coronary arteries: Normal coronary origins. Right dominance.  Right Coronary Artery: Dominant. Normal.  Left Main Coronary Artery: Normal. Bifurcates into the LAD and LCx arteries.  Left Anterior Descending Coronary Artery: Extends around the apex. Minimal mixed ostial 1-24% stenosis (CADRADS1). Mild eccentric non-calcified mid-LAD stenosis 24-49% (CADRADS2). Moderate size D1 vessel without disease. Smaller D2 vessel without disease.  Left Circumflex Artery: Large AV groove vessel that coarses down the lateral wall. Normal. Several small OM branches without disease.  Aorta: Dilated aortic root measuring 4.1 cm at the sinus of valsalva. No calcifications. No dissection.  Aortic Valve: Trileaflet. No calcifications.  Other findings:  Normal pulmonary vein drainage into the left atrium.  Normal left atrial appendage without a  thrombus.  Normal size of the pulmonary artery.  IMPRESSION: 1. Mild mixed non-obstructive CAD, CADRADS = 2.  2. Coronary calcium score of 41. This was 83rd percentile for age and sex matched control. Calcium noted in the LAD.  3. Normal coronary origin with right dominance.  4. Dilated aortic root measuring 4.1 cm at the sinus of Valsalva.  5. Aggressive cardiovascular risk factor modification is Recommended.  Cardiac monitor 01/24/20:  The patient's cardiac monitor enrollement period was for 6 days and 23 hours  The predominant rhythm was sinus rhythm with average HR 103bpm. HR ranged from 78bpm to 193bpm  There were 3 episodes of SVT with longest lasting 9 beats at HR 142bpm  There were rare PVCs and PACs  Most patient triggered events were associated with sinus tachycardia and occasionally with PACs or PVCs.  No Afib, VT or significant pauses   Recent Labs: 08/02/2019: ALT 38; Hemoglobin 14.4; Platelets 323.0 12/12/2019: BUN 11; Creatinine, Ser 1.05; Potassium 4.8; Sodium 141; TSH 1.980  Recent Lipid Panel    Component Value Date/Time   CHOL 177 08/02/2019 1637   TRIG 128.0 08/02/2019 1637   HDL 65.30 08/02/2019 1637   CHOLHDL 3 08/02/2019 1637   VLDL 25.6 08/02/2019 1637   LDLCALC 86 08/02/2019 1637     Physical Exam:    VS:  BP 110/74   Pulse (!) 105   Ht 5\' 9"  (1.753 m)   Wt 242 lb (109.8 kg)   SpO2 98%   BMI 35.74 kg/m     Wt Readings from Last 3 Encounters:  03/13/20 242 lb (109.8 kg)  01/03/20 244 lb 9.6 oz (110.9 kg)  12/12/19 244 lb (110.7 kg)     GEN:  Well nourished, well developed in no acute distress HEENT: Normal NECK: No JVD; No carotid bruits CARDIAC: RRR, no murmurs, rubs, gallops RESPIRATORY:  Clear to auscultation without rales, wheezing or rhonchi  ABDOMEN: Soft, non-tender, non-distended MUSCULOSKELETAL:  No edema; No deformity  SKIN: Warm and dry NEUROLOGIC:  Alert and oriented x 3 PSYCHIATRIC:  Normal affect    ASSESSMENT:    1. Dyspnea on exertion   2. Palpitations   3. Pure hypercholesterolemia   4. Obesity (BMI 35.0-39.9 without comorbidity)   5. Ascending aorta dilatation (HCC)   6. Cerebrovascular accident (CVA), unspecified mechanism ([place])    PLAN:    In order of problems listed above:  #Dyspnea on Exertion: Patient with continued dyspnea on exertion with associated palpitations. CTA coronaries with mild, non-obstructive disease. TTE in 2019 with normal EF, grade 2 DD, and no significant valvular abnormalities. May be related to poorly controlled HTN at  home in the setting of diastolic dysfunction and deconditioning and 40lbs weight gain over past year. D-dimer normal on last visit with low suspicion for PE. -Check limited TTE to reassess LV function given persistence of symptoms -CTA coronaries without obstructive disease -D-dimer normal -Aggressive blood pressure control  -If TTE without acute findings, may consider referral to pulm -Discussed diet and weight loss at length with him today as weight gain may be the primary driver of his symptoms--patient is motivated to eat healthy and increase his exercise  #Palpitations: Zio with brief episodes of SVT but otherwise normal. Triggered events mainly correlated with PVCs. TSH normal. -No significant arrhythmias on the monitor  #Non-obstructive CAD: Calcium score 41 with nonobstructive, mild CAD on CTA.  -Continue rosuvastatin 20mg  daily, ASA 81mg  daily -Continue diet and exercise modifications  #Hypertension: Elevated at home. -Continueamlodipine to 10mg  daily -Continue coreg 3.125mg  BID; up-titrate as needed -Continue lisinopril 20mg  daily  #History of CVA: Had CVA with right sided weakness and facial numbness in 2019. No residual deficits. -Bilateral carotid ultrasound with minimal plaque -Continue ASA 81mg  daily -Continue crestor 20mg  daily  #Ascending aorta dilation 49mm: -Routine surveillance  yearly  #Hyperlipdiemia: LDL 86, HDL 63. Managed by PCP. -Continue Crestor 20mg  daily  #Obesity with BMI 35: -Discussed diet and weight loss at length today as detailed below  Exercise recommendations: Goal of exercising for at least 30 minutes a day, at least 5 times per week.  Please exercise to a moderate exertion.  This means that while exercising it is difficult to speak in full sentences, however you are not so short of breath that you feel you must stop, and not so comfortable that you can carry on a full conversation.  Exertion level should be approximately a 5/10, if 10 is the most exertion you can perform.  Diet recommendations: Recommend a heart healthy diet such as the Mediterranean diet.  This diet consists of plant based foods, healthy fats, lean meats, olive oil.  It suggests limiting the intake of simple carbohydrates such as white breads, pastries, and pastas.  It also limits the amount of red meat, wine, and dairy products such as cheese that one should consume on a daily basis.   Medication Adjustments/Labs and Tests Ordered: Current medicines are reviewed at length with the patient today.  Concerns regarding medicines are outlined above.  Orders Placed This Encounter  Procedures  . ECHOCARDIOGRAM LIMITED   No orders of the defined types were placed in this encounter.   Patient Instructions  Medication Instructions:  The current medical regimen is effective;  continue present plan and medications.  *If you need a refill on your cardiac medications before your next appointment, please call your pharmacy*  Testing/Procedures: Your physician has requested that you have an echocardiogram. Echocardiography is a painless test that uses sound waves to create images of your heart. It provides your doctor with information about the size and shape of your heart and how well your heart's chambers and valves are working. This procedure takes approximately one hour. There are  no restrictions for this procedure.  Follow-Up: At [facility], you and your health needs are our priority.  As part of our continuing mission to provide you with exceptional heart care, we have created designated Provider Care Teams.  These Care Teams include your primary Cardiologist (physician) and Advanced Practice Providers (APPs -  Physician Assistants and Nurse Practitioners) who all work together to provide you with the care you need, when you need it.  We recommend signing up for the patient portal called "MyChart".  Sign up information is provided on this After Visit Summary.  MyChart is used to connect with patients for Virtual Visits (Telemedicine).  Patients are able to view lab/test results, encounter notes, upcoming appointments, etc.  Non-urgent messages can be sent to your provider as well.   To learn more about what you can do with MyChart, go to [email or web address].    Your next appointment:   6 month(s)  The format for your next appointment:   In Person  Provider:   You may see Bruce Bergeron, MD or one of the following Advanced Practice Providers on your designated Care Team:    Richardson Dopp, PA-C  Robbie Lis, [place]  Thank you for choosing [facility]!!         Signed, Bruce Bergeron, MD  03/13/2020 1:30 PM    [place]

## 2020-03-13 ENCOUNTER — Other Ambulatory Visit: Payer: Self-pay

## 2020-03-13 ENCOUNTER — Encounter: Payer: Self-pay | Admitting: Cardiology

## 2020-03-13 ENCOUNTER — Ambulatory Visit: Payer: No Typology Code available for payment source | Admitting: Cardiology

## 2020-03-13 VITALS — BP 110/74 | HR 105 | Ht 69.0 in | Wt 242.0 lb

## 2020-03-13 DIAGNOSIS — I639 Cerebral infarction, unspecified: Secondary | ICD-10-CM

## 2020-03-13 DIAGNOSIS — R002 Palpitations: Secondary | ICD-10-CM

## 2020-03-13 DIAGNOSIS — R0600 Dyspnea, unspecified: Secondary | ICD-10-CM

## 2020-03-13 DIAGNOSIS — R0609 Other forms of dyspnea: Secondary | ICD-10-CM

## 2020-03-13 DIAGNOSIS — E7800 Pure hypercholesterolemia, unspecified: Secondary | ICD-10-CM | POA: Diagnosis not present

## 2020-03-13 DIAGNOSIS — I77810 Thoracic aortic ectasia: Secondary | ICD-10-CM

## 2020-03-13 DIAGNOSIS — E669 Obesity, unspecified: Secondary | ICD-10-CM

## 2020-03-13 NOTE — Patient Instructions (Signed)
Medication Instructions:  The current medical regimen is effective;  continue present plan and medications.  *If you need a refill on your cardiac medications before your next appointment, please call your pharmacy*  Testing/Procedures: Your physician has requested that you have an echocardiogram. Echocardiography is a painless test that uses sound waves to create images of your heart. It provides your doctor with information about the size and shape of your heart and how well your heart's chambers and valves are working. This procedure takes approximately one hour. There are no restrictions for this procedure.  Follow-Up: At [facility], you and your health needs are our priority.  As part of our continuing mission to provide you with exceptional heart care, we have created designated Provider Care Teams.  These Care Teams include your primary Cardiologist (physician) and Advanced Practice Providers (APPs -  Physician Assistants and Nurse Practitioners) who all work together to provide you with the care you need, when you need it.  We recommend signing up for the patient portal called "MyChart".  Sign up information is provided on this After Visit Summary.  MyChart is used to connect with patients for Virtual Visits (Telemedicine).  Patients are able to view lab/test results, encounter notes, upcoming appointments, etc.  Non-urgent messages can be sent to your provider as well.   To learn more about what you can do with MyChart, go to [email or web address].    Your next appointment:   6 month(s)  The format for your next appointment:   In Person  Provider:   You may see Freada Bergeron, MD or one of the following Advanced Practice Providers on your designated Care Team:    Richardson Dopp, PA-C  Robbie Lis, [place]  Thank you for choosing [facility]!!

## 2020-04-01 ENCOUNTER — Other Ambulatory Visit: Payer: Self-pay | Admitting: Internal Medicine

## 2020-04-01 NOTE — Telephone Encounter (Signed)
All medications last filled 03/01/2020... please advise

## 2020-04-02 ENCOUNTER — Other Ambulatory Visit: Payer: Self-pay | Admitting: Cardiology

## 2020-04-02 ENCOUNTER — Other Ambulatory Visit: Payer: Self-pay

## 2020-04-02 ENCOUNTER — Other Ambulatory Visit: Payer: Self-pay | Admitting: Internal Medicine

## 2020-04-02 ENCOUNTER — Ambulatory Visit (HOSPITAL_COMMUNITY): Payer: No Typology Code available for payment source | Attending: Cardiovascular Disease

## 2020-04-02 ENCOUNTER — Telehealth: Payer: Self-pay | Admitting: Cardiology

## 2020-04-02 DIAGNOSIS — R0600 Dyspnea, unspecified: Secondary | ICD-10-CM | POA: Insufficient documentation

## 2020-04-02 DIAGNOSIS — R0609 Other forms of dyspnea: Secondary | ICD-10-CM

## 2020-04-02 LAB — ECHOCARDIOGRAM COMPLETE
Area-P 1/2: 4.8 cm2
S' Lateral: 2.9 cm

## 2020-04-02 NOTE — Telephone Encounter (Signed)
° ° °  Pt is returning Pam's call for his echo result

## 2020-04-02 NOTE — Telephone Encounter (Signed)
Informed patient of results and verbal understanding expressed.  

## 2020-04-09 ENCOUNTER — Other Ambulatory Visit: Payer: Self-pay

## 2020-04-09 ENCOUNTER — Observation Stay (HOSPITAL_COMMUNITY)
Admission: EM | Admit: 2020-04-09 | Discharge: 2020-04-11 | Disposition: A | Discharge status: Home / Self Care | Payer: No Typology Code available for payment source | Attending: Cardiovascular Disease | Admitting: Cardiovascular Disease

## 2020-04-09 ENCOUNTER — Encounter (HOSPITAL_COMMUNITY): Payer: Self-pay

## 2020-04-09 ENCOUNTER — Emergency Department (HOSPITAL_COMMUNITY): Payer: No Typology Code available for payment source

## 2020-04-09 ENCOUNTER — Encounter: Payer: Self-pay | Admitting: Internal Medicine

## 2020-04-09 ENCOUNTER — Encounter (HOSPITAL_COMMUNITY): Payer: Self-pay | Admitting: Physician Assistant

## 2020-04-09 ENCOUNTER — Ambulatory Visit (HOSPITAL_COMMUNITY)
Admission: EM | Admit: 2020-04-09 | Discharge: 2020-04-09 | Payer: No Typology Code available for payment source | Attending: Family Medicine | Admitting: Family Medicine

## 2020-04-09 DIAGNOSIS — Z79899 Other long term (current) drug therapy: Secondary | ICD-10-CM | POA: Diagnosis not present

## 2020-04-09 DIAGNOSIS — F17220 Nicotine dependence, chewing tobacco, uncomplicated: Secondary | ICD-10-CM | POA: Diagnosis not present

## 2020-04-09 DIAGNOSIS — I10 Essential (primary) hypertension: Secondary | ICD-10-CM | POA: Diagnosis not present

## 2020-04-09 DIAGNOSIS — Z96642 Presence of left artificial hip joint: Secondary | ICD-10-CM | POA: Diagnosis not present

## 2020-04-09 DIAGNOSIS — R0789 Other chest pain: Secondary | ICD-10-CM

## 2020-04-09 DIAGNOSIS — E1169 Type 2 diabetes mellitus with other specified complication: Secondary | ICD-10-CM | POA: Diagnosis present

## 2020-04-09 DIAGNOSIS — R0602 Shortness of breath: Secondary | ICD-10-CM

## 2020-04-09 DIAGNOSIS — Z7982 Long term (current) use of aspirin: Secondary | ICD-10-CM | POA: Diagnosis not present

## 2020-04-09 DIAGNOSIS — E785 Hyperlipidemia, unspecified: Secondary | ICD-10-CM | POA: Diagnosis present

## 2020-04-09 DIAGNOSIS — I77810 Thoracic aortic ectasia: Secondary | ICD-10-CM

## 2020-04-09 DIAGNOSIS — Z20822 Contact with and (suspected) exposure to covid-19: Secondary | ICD-10-CM | POA: Diagnosis not present

## 2020-04-09 DIAGNOSIS — E1159 Type 2 diabetes mellitus with other circulatory complications: Secondary | ICD-10-CM | POA: Diagnosis present

## 2020-04-09 DIAGNOSIS — R079 Chest pain, unspecified: Secondary | ICD-10-CM | POA: Diagnosis present

## 2020-04-09 DIAGNOSIS — I200 Unstable angina: Secondary | ICD-10-CM | POA: Diagnosis not present

## 2020-04-09 DIAGNOSIS — I25110 Atherosclerotic heart disease of native coronary artery with unstable angina pectoris: Principal | ICD-10-CM | POA: Insufficient documentation

## 2020-04-09 DIAGNOSIS — Z8673 Personal history of transient ischemic attack (TIA), and cerebral infarction without residual deficits: Secondary | ICD-10-CM | POA: Diagnosis not present

## 2020-04-09 HISTORY — DX: Unstable angina: I20.0

## 2020-04-09 LAB — COMPREHENSIVE METABOLIC PANEL
ALT: 26 U/L (ref 0–44)
AST: 29 U/L (ref 15–41)
Albumin: 4.1 g/dL (ref 3.5–5.0)
Alkaline Phosphatase: 54 U/L (ref 38–126)
Anion gap: 7 (ref 5–15)
BUN: 13 mg/dL (ref 6–20)
CO2: 24 mmol/L (ref 22–32)
Calcium: 9 mg/dL (ref 8.9–10.3)
Chloride: 105 mmol/L (ref 98–111)
Creatinine, Ser: 1.21 mg/dL (ref 0.61–1.24)
GFR, Estimated: >60 mL/min (ref >60)
Glucose, Bld: 99 mg/dL (ref 70–99)
Potassium: 4 mmol/L (ref 3.5–5.1)
Sodium: 136 mmol/L (ref 135–145)
Total Bilirubin: 0.5 mg/dL (ref 0.3–1.2)
Total Protein: 6.9 g/dL (ref 6.5–8.1)

## 2020-04-09 LAB — TROPONIN I (HIGH SENSITIVITY)
Troponin I (High Sensitivity): <2 ng/L (ref <18)
Troponin I (High Sensitivity): 2 ng/L (ref <18)

## 2020-04-09 LAB — CBC
HCT: 42.3 % (ref 39.0–52.0)
Hemoglobin: 14.3 g/dL (ref 13.0–17.0)
MCH: 30.8 pg (ref 26.0–34.0)
MCHC: 33.8 g/dL (ref 30.0–36.0)
MCV: 91.2 fL (ref 80.0–100.0)
Platelets: 314 10*3/uL (ref 150–400)
RBC: 4.64 MIL/uL (ref 4.22–5.81)
RDW: 13.9 % (ref 11.5–15.5)
WBC: 8.8 10*3/uL (ref 4.0–10.5)
nRBC: 0 % (ref 0.0–0.2)

## 2020-04-09 LAB — POC SARS CORONAVIRUS 2 AG -  ED: SARS Coronavirus 2 Ag: NEGATIVE

## 2020-04-09 LAB — CBC WITH DIFFERENTIAL/PLATELET
Abs Immature Granulocytes: 0.07 10*3/uL (ref 0.00–0.07)
Basophils Absolute: 0.1 10*3/uL (ref 0.0–0.1)
Basophils Relative: 1 %
Eosinophils Absolute: 0.2 10*3/uL (ref 0.0–0.5)
Eosinophils Relative: 2 %
HCT: 45.8 % (ref 39.0–52.0)
Hemoglobin: 14.9 g/dL (ref 13.0–17.0)
Immature Granulocytes: 1 %
Lymphocytes Relative: 23 %
Lymphs Abs: 2.4 10*3/uL (ref 0.7–4.0)
MCH: 30.4 pg (ref 26.0–34.0)
MCHC: 32.5 g/dL (ref 30.0–36.0)
MCV: 93.5 fL (ref 80.0–100.0)
Monocytes Absolute: 1 10*3/uL (ref 0.1–1.0)
Monocytes Relative: 10 %
Neutro Abs: 6.7 10*3/uL (ref 1.7–7.7)
Neutrophils Relative %: 63 %
Platelets: 310 10*3/uL (ref 150–400)
RBC: 4.9 MIL/uL (ref 4.22–5.81)
RDW: 13.2 % (ref 11.5–15.5)
WBC: 10.4 10*3/uL (ref 4.0–10.5)
nRBC: 0 % (ref 0.0–0.2)

## 2020-04-09 LAB — CREATININE, SERUM
Creatinine, Ser: 1.05 mg/dL (ref 0.61–1.24)
GFR, Estimated: >60 mL/min (ref >60)

## 2020-04-09 LAB — D-DIMER, QUANTITATIVE: D-Dimer, Quant: 0.35 ug/mL-FEU (ref 0.00–0.50)

## 2020-04-09 MED ORDER — ONDANSETRON HCL 4 MG/2ML IJ SOLN: 4.0000 mg | Freq: Four times a day (QID) | INTRAMUSCULAR | Status: DC | PRN

## 2020-04-09 MED ORDER — FLUTICASONE PROPIONATE 50 MCG/ACT NA SUSP
2.0000 | Freq: Every day | NASAL | Status: DC | PRN
  Filled 2020-04-09: qty 16

## 2020-04-09 MED ORDER — ZOLPIDEM TARTRATE 5 MG PO TABS: 5.0000 mg | ORAL_TABLET | Freq: Every evening | ORAL | Status: DC | PRN

## 2020-04-09 MED ORDER — CYCLOBENZAPRINE HCL 10 MG PO TABS: 10.0000 mg | ORAL_TABLET | Freq: Three times a day (TID) | ORAL | Status: DC | PRN

## 2020-04-09 MED ORDER — HYDROCODONE-ACETAMINOPHEN 10-325 MG PO TABS: 1.0000 | ORAL_TABLET | Freq: Four times a day (QID) | ORAL | Status: DC | PRN

## 2020-04-09 MED ORDER — SODIUM CHLORIDE 0.9% FLUSH
3.0000 mL | Freq: Two times a day (BID) | INTRAVENOUS | Status: DC
  Administered 2020-04-10 (×2): 3 mL via INTRAVENOUS

## 2020-04-09 MED ORDER — CARVEDILOL 3.125 MG PO TABS
3.1250 mg | ORAL_TABLET | Freq: Two times a day (BID) | ORAL | Status: DC
  Administered 2020-04-10 (×2): 3.125 mg via ORAL
  Filled 2020-04-09 (×5): qty 1

## 2020-04-09 MED ORDER — ZOLPIDEM TARTRATE 5 MG PO TABS
5.0000 mg | ORAL_TABLET | Freq: Every evening | ORAL | Status: DC | PRN
  Administered 2020-04-10: 5 mg via ORAL
  Filled 2020-04-09: qty 1

## 2020-04-09 MED ORDER — AMLODIPINE BESYLATE 10 MG PO TABS
10.0000 mg | ORAL_TABLET | Freq: Every day | ORAL | Status: DC
  Administered 2020-04-09 – 2020-04-10 (×2): 10 mg via ORAL
  Filled 2020-04-09 (×2): qty 2

## 2020-04-09 MED ORDER — ASPIRIN EC 81 MG PO TBEC
81.0000 mg | DELAYED_RELEASE_TABLET | Freq: Every day | ORAL | Status: DC
  Administered 2020-04-10: 81 mg via ORAL
  Filled 2020-04-09: qty 1

## 2020-04-09 MED ORDER — LISINOPRIL 20 MG PO TABS
20.0000 mg | ORAL_TABLET | Freq: Every day | ORAL | Status: DC
  Administered 2020-04-09: 20 mg via ORAL
  Filled 2020-04-09: qty 1

## 2020-04-09 MED ORDER — GABAPENTIN 300 MG PO CAPS
300.0000 mg | ORAL_CAPSULE | Freq: Three times a day (TID) | ORAL | Status: DC
  Administered 2020-04-09 – 2020-04-10 (×4): 300 mg via ORAL
  Filled 2020-04-09 (×4): qty 1

## 2020-04-09 MED ORDER — SODIUM CHLORIDE 0.9 % IV SOLN: 250.0000 mL | INTRAVENOUS | Status: DC | PRN

## 2020-04-09 MED ORDER — CLONAZEPAM 0.5 MG PO TABS
0.5000 mg | ORAL_TABLET | Freq: Every day | ORAL | Status: DC
  Administered 2020-04-09 – 2020-04-10 (×2): 0.5 mg via ORAL
  Filled 2020-04-09 (×3): qty 1

## 2020-04-09 MED ORDER — SODIUM CHLORIDE 0.9% FLUSH
3.0000 mL | INTRAVENOUS | Status: DC | PRN
  Administered 2020-04-10: 3 mL via INTRAVENOUS

## 2020-04-09 MED ORDER — ENOXAPARIN SODIUM 40 MG/0.4ML ~~LOC~~ SOLN
40.0000 mg | SUBCUTANEOUS | Status: DC
Start: 2020-04-09 — End: 2020-04-11
  Administered 2020-04-09 – 2020-04-10 (×2): 40 mg via SUBCUTANEOUS
  Filled 2020-04-09 (×2): qty 0.4

## 2020-04-09 MED ORDER — ROSUVASTATIN CALCIUM 20 MG PO TABS
20.0000 mg | ORAL_TABLET | Freq: Every day | ORAL | Status: DC
  Administered 2020-04-10: 20 mg via ORAL
  Filled 2020-04-09: qty 1

## 2020-04-09 MED ORDER — PANTOPRAZOLE SODIUM 40 MG PO TBEC
40.0000 mg | DELAYED_RELEASE_TABLET | Freq: Two times a day (BID) | ORAL | Status: DC
  Administered 2020-04-09 – 2020-04-10 (×3): 40 mg via ORAL
  Filled 2020-04-09 (×3): qty 1

## 2020-04-09 MED ORDER — TOPIRAMATE ER 50 MG PO CAP24: 50.0000 mg | ORAL_CAPSULE | Freq: Every day | ORAL | Status: DC

## 2020-04-09 MED ORDER — ACETAMINOPHEN 325 MG PO TABS: 650.0000 mg | ORAL_TABLET | ORAL | Status: DC | PRN

## 2020-04-09 MED ORDER — ALPRAZOLAM 0.25 MG PO TABS
0.2500 mg | ORAL_TABLET | Freq: Two times a day (BID) | ORAL | Status: DC | PRN
  Administered 2020-04-10: 0.25 mg via ORAL
  Filled 2020-04-09: qty 1

## 2020-04-09 MED ORDER — NITROGLYCERIN 0.4 MG SL SUBL: 0.4000 mg | SUBLINGUAL_TABLET | SUBLINGUAL | Status: DC | PRN

## 2020-04-09 MED ORDER — VITAMIN D (ERGOCALCIFEROL) 1.25 MG (50000 UNIT) PO CAPS: 50000.0000 [IU] | ORAL_CAPSULE | ORAL | Status: DC

## 2020-04-09 NOTE — H&P (Addendum)
Cardiology Admission History and Physical:   Patient ID: DENVER BENTSON; MRN: [id number]; DOB: 11-10-1970   Admission date: 04/09/2020  Primary Care Provider: [name], NP Primary Cardiologist: Freada Bergeron, MD 03/13/2020 Primary Electrophysiologist: None    Chief Complaint:  Unstable angina  Patient Profile:   [name] is a 50 y.o. male with a history of non-obs CAD by CT 2021, UGI bleed 2012, GERD, anxiety, depression, brief SVT by monitor, FH CAD.   History of Present Illness:   [name] has been noticing exertional chest pain for several weeks. The symptoms are a pressure/tightness and are relieved by rest.   The symptoms have progressed to the point that he cannot walk 100 feet without getting chest pain.  He does not have nitroglycerin has not taken it.  The chest pain is associated with shortness of breath, but no nausea, vomiting, or diaphoresis.  He has had panic attacks in the past, but states those symptoms are different from what he is having now.  When he saw Dr. Johney Frame 03/13/2020, he was complaining of dyspnea on exertion, but no chest pain.  She ordered an echo which showed an normal EF.  He has known dilatation of the aortic root, 41 mm.    Past Medical History:  Diagnosis Date   Anxiety    takes Xanax daily prn, panic attacks   Chronic gastric ulcer with bleeding s/p suture repair 2012 09/29/2010   Head 3 EGD which failed to stop the bleeding. Had exploratory laparotomy on 10/04/2010. Possible cause of the gastric ulcer was NSAID use.    Constipation    related to pain meds   Depression    Dizziness    occasionally   Dyspnea    walking a bit   Erosive esophagitis    External hemorrhoid, bleeding    Gastric ulcer    history of   GERD (gastroesophageal reflux disease)    takes Protonix bid   Hiatal hernia    History of blood clots 2012   in abdomen   History of blood transfusion 2012   Incisional hernia s/p open repair w mesh Aug  2013 05/26/2011   Joint pain    knees   Nocturia    depends on amount of fluid he drinks   Pneumonia 2018   Pre-diabetes    Primary localized osteoarthritis of left hip 04/24/2014   Serrated polyp of colon    Sleep apnea    no cpap  mild no cpap needed   Stroke [facility])    Unstable angina ([place]) 04/09/2020   Upper GI bleed    Ventral hernia     Past Surgical History:  Procedure Laterality Date   ANTERIOR CERVICAL DECOMPRESSION/DISCECTOMY FUSION 4 LEVELS N/A 04/08/2018   Procedure: ACDF - C3-C4 - C4-C5 - C5-C6 - C6-C7;  Surgeon: Kary Kos, MD;  Location: [place];  Service: Neurosurgery;  Laterality: N/A;   CARPAL TUNNEL RELEASE Left 04/03/2018   COLONOSCOPY     ESOPHAGEAL MANOMETRY N/A 10/19/2016   Procedure: ESOPHAGEAL MANOMETRY (EM);  Surgeon: Mauri Pole, MD;  Location: [facility];  Service: Endoscopy;  Laterality: N/A;   ESOPHAGOGASTRODUODENOSCOPY     ESOPHAGOGASTRODUODENOSCOPY N/A 01/22/2017   Procedure: ESOPHAGOGASTRODUODENOSCOPY (EGD);  Surgeon: [name], MD;  Location: [facility];  Service: General;  Laterality: N/A;   INCISIONAL HERNIA REPAIR  09/04/2011   Procedure: HERNIA REPAIR INCISIONAL;  Surgeon: Madilyn Hook, DO;  Location: [place];  Service: General;  Laterality: N/A;  debridment  calcified mass   INSERTION OF MESH  09/04/2011   retrorectus ultrapro "30x30cm"   INSERTION OF MESH N/A 01/22/2017   Procedure: INSERTION OF MESH;  Surgeon: [name], MD;  Location: [facility];  Service: General;  Laterality: N/A;   NISSEN FUNDOPLICATION     Open 4097D   POLYPECTOMY     STOMACH SURGERY  10/05/2010   Oversewing of gastric ulcer.  Dr Brantley Stage   TOTAL HIP ARTHROPLASTY Left 04/24/2014   Procedure: LEFT TOTAL HIP ARTHROPLASTY;  Surgeon: Marchia Bond, MD;  Location: [place];  Service: Orthopedics;  Laterality: Left;   UPPER GASTROINTESTINAL ENDOSCOPY       Medications Prior to Admission: Prior to Admission medications   Medication Sig Start Date End Date Taking? Authorizing  Provider  acetaminophen (TYLENOL) 500 MG tablet Take 1,000 mg by mouth every 6 (six) hours as needed for mild pain or headache.    [provider]  amLODipine (NORVASC) 10 MG tablet Take 1 tablet (10 mg total) by mouth daily. 12/12/19   Freada Bergeron, MD  aspirin EC 81 MG EC tablet Take 1 tablet (81 mg total) by mouth daily. 10/08/17   Domenic Polite, MD  carvedilol (COREG) 3.125 MG tablet Take 1 tablet (3.125 mg total) by mouth 2 (two) times daily. 12/12/19 03/11/20  Freada Bergeron, MD  clonazePAM (KLONOPIN) 0.5 MG tablet TAKE 1 TABLET (0.5 MG TOTAL) BY MOUTH AT BEDTIME. 04/02/20   Baity, Coralie Keens, NP  cyclobenzaprine (FLEXERIL) 10 MG tablet TAKE 1 TABLET BY MOUTH 3 TIMES EVERY DAY AS NEEDED FOR MUSCLE SPASMS 04/02/20   [name], NP  fluticasone (FLONASE) 50 MCG/ACT nasal spray Place 2 sprays into both nostrils daily as needed for allergies. 06/14/18   [name], MD  gabapentin (NEURONTIN) 300 MG capsule TAKE 1 CAPSULE (300 MG TOTAL) BY MOUTH 3 (THREE) TIMES DAILY. 02/09/20   [name], NP  HYDROcodone-acetaminophen (NORCO) 10-325 MG tablet Take 1 tablet by mouth every 6 (six) hours as needed. 04/09/18   Earnie Larsson, MD  lisinopril (ZESTRIL) 20 MG tablet TAKE 1 TABLET (20 MG TOTAL) BY MOUTH DAILY. 12/27/19   [name], NP  meloxicam (MOBIC) 7.5 MG tablet Take 7.5 mg by mouth 2 (two) times daily. 10/27/19   [provider]  ondansetron (ZOFRAN) 4 MG tablet TAKE 1 TABLET BY MOUTH EVERY 8 HOURS AS NEEDED FOR NAUSEA OR VOMITING. 03/01/20   [name], NP  pantoprazole (PROTONIX) 40 MG tablet TAKE 1 TABLET (40 MG TOTAL) BY MOUTH 2 (TWO) TIMES DAILY. 10/13/19   [name], NP  rosuvastatin (CRESTOR) 20 MG tablet Take 1 tablet (20 mg total) by mouth daily. 11/01/19   Freada Bergeron, MD  Topiramate ER (TROKENDI XR) 50 MG CP24 Take 50 mg by mouth at bedtime. 01/03/20   Pieter Partridge, DO  Vitamin D, Ergocalciferol, (DRISDOL) 1.25 MG (50000 UNIT) CAPS capsule  Take 1 capsule (50,000 Units total) by mouth every 7 (seven) days. 06/21/19   [name], NP  zolpidem (AMBIEN CR) 12.5 MG CR tablet TAKE 1 TABLET (12.5 MG TOTAL) BY MOUTH AT BEDTIME. 04/02/20   [name], NP     Allergies:    Allergies  Allergen Reactions   Dilaudid [Hydromorphone Hcl] Rash and Other (See Comments)    Shakey   Hydromorphone Hcl Other (See Comments) and Rash    Shakey   Metoclopramide Other (See Comments), Rash, Swelling and Hives    Swelling  in lips   Metoclopramide Hcl Hives, Swelling, Rash and Other (See Comments)    Swelling in lips   Nsaids     H/o bleeding gastric ulcers   Benadryl [Diphenhydramine]     Causes patient to become hyper and pace    Social History:   Social History   Socioeconomic History   Marital status: Single    Spouse name: Not on file   Number of children: 1   Years of education: 12   Highest education level: High school graduate  Occupational History   Occupation: Maintenance - [employer]  Tobacco Use   Smoking status: Never Smoker   Smokeless tobacco: Current User    Types: Chew   Tobacco comment: occ-Chew  Vaping Use   Vaping Use: Never used  Substance and Sexual Activity   Alcohol use: Yes    Alcohol/week: 0.0 standard drinks    Comment: 09/04/11 "very seldom"   Drug use: No   Sexual activity: Yes  Other Topics Concern   Not on file  Social History Narrative   Lives with brother in a one story home.  Has one daughter.  Works in Theatre manager for [employer].  Education: high school.       Patient is left-handed.   Social Determinants of Health   Financial Resource Strain: Not on file  Food Insecurity: Not on file  Transportation Needs: Not on file  Physical Activity: Not on file  Stress: Not on file  Social Connections: Not on file  Intimate Partner Violence: Not on file    Family History:  The patient's family history includes Diabetes in his father and mother; Healthy in his brother, brother, and  sister; Heart disease in his father; Liver disease in his mother. There is no history of Colon cancer, Stomach cancer, Rectal cancer, Esophageal cancer, or Colon polyps.   The patient He indicated that his mother is deceased. He indicated that his father is deceased. He indicated that his sister is alive. He indicated that both of his brothers are alive. He indicated that the status of his neg hx is unknown.    ROS:  Please see the history of present illness.  All other ROS reviewed and negative.     Physical Exam/Data:   Vitals:   04/09/20 1645 04/09/20 1700 04/09/20 1804 04/09/20 1934  BP: 131/86 131/90 (!) 140/96 (!) 132/97  Pulse: 99 100 97 95  Resp: 19 20 17 17   Temp:      SpO2: 98% 96% 95% 98%   No intake or output data in the 24 hours ending 04/09/20 2036 There were no vitals filed for this visit. There is no height or weight on file to calculate BMI.  General:  Well nourished, well developed, male in no acute distress HEENT: normal Lymph: no adenopathy Neck:  JVD not elevated Endocrine:  No thryomegaly Vascular: No carotid bruits; 4/4 extremity pulses 2+ bilaterally  Cardiac:  normal S1, S2; RRR; no murmur, no rub or gallop  Lungs:  clear to auscultation bilaterally, no wheezing, rhonchi or rales  Abd: soft, nontender, no hepatomegaly  Ext: no edema Musculoskeletal:  No deformities, BUE and BLE strength normal and equal Skin: warm and dry  Neuro:  CNs 2-12 intact, no focal abnormalities noted Psych:  Normal affect    EKG:  The ECG was personally reviewed: SR, LVH by voltage, HR 92 Telemetry: SR  Relevant CV Studies:  ECHO: 04/02/2020  1. Left ventricular ejection fraction, by estimation, is 55 to  60%. The  left ventricle has normal function. The left ventricle has no regional  wall motion abnormalities. Left ventricular diastolic parameters were  normal. The average left ventricular  global longitudinal strain is -25.2 %. The global longitudinal strain is   normal.   2. Right ventricular systolic function is normal. The right ventricular  size is normal.   3. The mitral valve is normal in structure. No evidence of mitral valve  regurgitation. No evidence of mitral stenosis.   4. The aortic valve is tricuspid. Aortic valve regurgitation is not  visualized. No aortic stenosis is present.   5. Aortic dilatation noted. There is mild dilatation of the aortic root,  measuring 41 mm.   6. The inferior vena cava is normal in size with greater than 50%  respiratory variability, suggesting right atrial pressure of 3 mmHg.   Comparison(s): 10/06/17 EF 55-60%.   CARDIAC CT: 11/20/2019 Coronary calcium score: The patient's coronary artery calcium score is 41, which places the patient in the 83rd percentile.   Coronary arteries: Normal coronary origins.  Right dominance.   Right Coronary Artery: Dominant.  Normal.   Left Main Coronary Artery: Normal. Bifurcates into the LAD and LCx arteries.   Left Anterior Descending Coronary Artery: Extends around the apex. Minimal mixed ostial 1-24% stenosis (CADRADS1). Mild eccentric non-calcified mid-LAD stenosis 24-49% (CADRADS2). Moderate size D1 vessel without disease. Smaller D2 vessel without disease.   Left Circumflex Artery: Large AV groove vessel that coarses down the lateral wall. Normal. Several small OM branches without disease.   Aorta: Dilated aortic root measuring 4.1 cm at the sinus of valsalva. No calcifications. No dissection.   Aortic Valve: Trileaflet.  No calcifications.   Other findings:   Normal pulmonary vein drainage into the left atrium.   Normal left atrial appendage without a thrombus.   Normal size of the pulmonary artery.   IMPRESSION: 1. Mild mixed non-obstructive CAD, CADRADS = 2.   2. Coronary calcium score of 41. This was 83rd percentile for age and sex matched control. Calcium noted in the LAD.   3. Normal coronary origin with right dominance.   4. Dilated  aortic root measuring 4.1 cm at the sinus of Valsalva.   5. Aggressive cardiovascular risk factor modification is recommended.  Laboratory Data:  Chemistry Recent Labs  Lab 04/09/20 1553  NA 136  K 4.0  CL 105  CO2 24  GLUCOSE 99  BUN 13  CREATININE 1.21  CALCIUM 9.0  GFRNONAA >60  ANIONGAP 7    Recent Labs  Lab 04/09/20 1553  PROT 6.9  ALBUMIN 4.1  AST 29  ALT 26  ALKPHOS 54  BILITOT 0.5   Hematology Recent Labs  Lab 04/09/20 1553  WBC 10.4  RBC 4.90  HGB 14.9  HCT 45.8  MCV 93.5  MCH 30.4  MCHC 32.5  RDW 13.2  PLT 310   Cardiac Enzymes  High Sensitivity Troponin:   Recent Labs  Lab 04/09/20 1553 04/09/20 1751  TROPONINIHS <2 2     BNPNo results for input(s): BNP, PROBNP in the last 168 hours.  DDimer  Recent Labs  Lab 04/09/20 1646  DDIMER 0.35   Lipids:  Lab Results  Component Value Date   CHOL 177 08/02/2019   HDL 65.30 08/02/2019   LDLCALC 86 08/02/2019   TRIG 128.0 08/02/2019   CHOLHDL 3 08/02/2019   INR:  Lab Results  Component Value Date   INR 0.98 11/06/2017   INR 0.92 10/05/2017   INR  0.96 04/12/2014   A1c:  Lab Results  Component Value Date   HGBA1C 6.2 08/02/2019   Thyroid:  Lab Results  Component Value Date   TSH 1.980 12/12/2019    Radiology/Studies:  DG Chest 2 View  Result Date: 04/09/2020 CLINICAL DATA:  Shortness of breath and chest heaviness EXAM: CHEST - 2 VIEW COMPARISON:  January 25, 2017 FINDINGS: There is mild lingular atelectasis. Lungs elsewhere clear. Heart size and pulmonary vascularity are normal. No adenopathy. Postoperative change noted in the lower cervical spine. No adenopathy. No bone lesions. No pneumothorax. IMPRESSION: Mild lingular atelectasis. No edema or airspace opacity. Cardiac silhouette within normal limits. Electronically Signed   By: Lowella Grip III M.D.   On: 04/09/2020 16:24    Assessment and Plan:   1. Unstable angina -Symptoms have been progressive, and are now  significantly limiting his activity. -No significant change in his EKG from the ECG dated 11/01/2019 -Initial troponin negative -He describes progressive angina, despite a CT last year showing nonobstructive CAD -Feel definitive testing is warranted, Cardiac catheterization is indicated  -Cardiac catheterization was discussed with the patient fully. The patient understands that risks include but are not limited to stroke (1 in 1000), death (1 in 40), kidney failure [usually temporary] (1 in 500), bleeding (1 in 200), allergic reaction [possibly serious] (1 in 200).  The patient understands and is willing to proceed.   -Otherwise, continue home medications    Principal Problem:   Unstable angina ([place])     For questions or updates, please contact [place] Please consult [email or web address] for contact info under Cardiology/STEMI.    Signed, Rosaria Ferries, PA-C  04/09/2020 8:36 PM   I have seen and examined the patient along with Rosaria Ferries, PA-C .  I have reviewed the chart, notes and new data.  I agree with PA/NP's note.  Key new complaints: He gives a fairly convincing history of exertional angina pectoris that has been steadily worsening, occurring with lesser and lesser activity, now happens even if he walks at a faster than usual pace in the home.  Symptoms are promptly relieved by Key examination changes: Normal cardiovascular exam, overweight Key new findings / data: ECG at rest is normal.  Cardiac enzymes are normal.  Cardiac CT shows an elevated calcium score (83rd percentile) but without obstructive lesions last October.  PLAN: Has fairly convincing symptoms of angina pectoris with activity, despite taking 2 antianginal medications (amlodipine and carvedilol).  Angina is now occurring microfracturing with activity.  Have suggested proceeding directly to cardiac catheterization and he agrees.  This procedure has been fully reviewed with the patient and informed consent has  been obtained.   Sanda Klein, MD, [place] [phone] 04/09/2020, 8:50 PM

## 2020-04-09 NOTE — ED Notes (Signed)
Patient is being discharged from the Urgent Care and sent to the Emergency Department via personal vehicle . Per Dr Mannie Stabile, patient is in need of higher level of care due to needing a cardiac workup. Patient is aware and verbalizes understanding of plan of care.   Vitals:   04/09/20 1515  BP: (!) 142/85  Pulse: (!) 109  Resp: 18  Temp: 98.9 F (37.2 C)  SpO2: 100%

## 2020-04-09 NOTE — ED Triage Notes (Signed)
Arrived from [facility]; stated been having high blood pressure and shortness of breathe x [redacted] week along w/ chest tightness.

## 2020-04-09 NOTE — Telephone Encounter (Signed)
I spoke with pt;pt said starting 04/06/20 when stands up or moves quickly has dizziness where rooms pins around for couple of minutes and lightheadedness that last about 30'. No dizziness or lightheadedness now; last dizziness and lightheadedness was after lunch today. Advised pt if laying to get in sitting position for a few minutes and if sitting to stand very slowly. Pt voiced understanding. Pt has H/A and feels like some one sitting on chest and SOB now and for last 2 weeks continuously. On 04/08/20 BP 150 something/90 something and P 112. Pt said he is at work at [employer] and he is going to [employer] now; pt does not feel in distress that needs to go to ED. Pt wanted to keep 30' appt already scheduled on 04/16/20 at 9 AM for UC FU appt. Sending note to Avie Echevaria NP.

## 2020-04-09 NOTE — Telephone Encounter (Signed)
Noted, will review UC note

## 2020-04-09 NOTE — ED Triage Notes (Signed)
Pt presents with HBP and SOB x 4 days. Pt denies coughing and states he has had tightness in chest.

## 2020-04-09 NOTE — ED Provider Notes (Signed)
[facility] Provider Note   CSN: [id number] Arrival date & time: 04/09/20  1542     History Chief Complaint  Patient presents with  . Shortness of Breath    [name] is a 50 y.o. male with PMH/o anxiety, dyspnea, GERD, blood clot in his abdomen who presents for evaluation of chest pain, shortness of breath, lightheadedness.  He states that about a week ago, he started noticing that he was lightheaded and would feel like he was going to pass out.  He states he then started having some episodes of chest pressure and shortness of breath.  He states he mainly notices at work when he would start doing a project.  He states that he would stop and it would resolve.  He states that as a week is gone by, the episodes of gotten more frequent, more severe.  He states that he has noticed that they are tied to exertional activities.  He states when he gets up and walks a flight of stairs, he will have some chest pressure and shortness of breath and get diaphoretic.  He states that this is new for him.  He reports occasionally, when he would get stressed or worked up, he would get short of breath and have tightness across his chest but states that this is happened when he started doing activities which made him concerned.  He states that if he sits down and rest, he feels better.  He does not take any medications for the symptoms.  He denies a room spinning sensation.  He denies any vision changes, abdominal pain, nausea/vomiting, cough. He has been vaccinated for COVID. HE denies any COVID exposure that he knows of.  He does have high blood pressure.  He does not smoke.  No personal cardiac history.  No family history of MI before the age of 77.  He reports he had blood clots in his stomach previously. He denies any exogenous hormone [place], recent immobilization, prior history of DVT/PE, recent surgery, leg swelling, or long travel.  The history is provided by the patient.        Past Medical History:  Diagnosis Date  . Anxiety    takes Xanax daily prn, panic attacks  . Chronic gastric ulcer with bleeding s/p suture repair 2012 09/29/2010   Head 3 EGD which failed to stop the bleeding. Had exploratory laparotomy on 10/04/2010. Possible cause of the gastric ulcer was NSAID use.   . Constipation    related to pain meds  . Depression   . Dizziness    occasionally  . Dyspnea    walking a bit  . Erosive esophagitis   . External hemorrhoid, bleeding   . Gastric ulcer    history of  . GERD (gastroesophageal reflux disease)    takes Protonix bid  . Hiatal hernia   . History of blood clots 2012   in abdomen  . History of blood transfusion 2012  . Incisional hernia s/p open repair w mesh Aug 2013 05/26/2011  . Joint pain    knees  . Nocturia    depends on amount of fluid he drinks  . Pneumonia 2018  . Pre-diabetes   . Primary localized osteoarthritis of left hip 04/24/2014  . Serrated polyp of colon   . Sleep apnea    no cpap  mild no cpap needed  . Stroke ([place])   . Unstable angina ([place]) 04/09/2020  . Upper GI bleed   . Ventral  hernia     Patient Active Problem List   Diagnosis Date Noted  . Unstable angina ([place]) 04/09/2020  . HTN (hypertension) 08/17/2019  . Frequent headaches 05/15/2019  . HLD (hyperlipidemia) 12/21/2018  . Stroke ([place]) 10/05/2017  . Gastroesophageal reflux disease   . Insomnia 05/30/2014  . Anxiety 05/26/2011    Past Surgical History:  Procedure Laterality Date  . ANTERIOR CERVICAL DECOMPRESSION/DISCECTOMY FUSION 4 LEVELS N/A 04/08/2018   Procedure: ACDF - C3-C4 - C4-C5 - C5-C6 - C6-C7;  Surgeon: Kary Kos, MD;  Location: [facility];  Service: Neurosurgery;  Laterality: N/A;  . CARPAL TUNNEL RELEASE Left 04/03/2018  . COLONOSCOPY    . ESOPHAGEAL MANOMETRY N/A 10/19/2016   Procedure: ESOPHAGEAL MANOMETRY (EM);  Surgeon: Mauri Pole, MD;  Location: [facility];  Service: Endoscopy;  Laterality: N/A;  .  ESOPHAGOGASTRODUODENOSCOPY    . ESOPHAGOGASTRODUODENOSCOPY N/A 01/22/2017   Procedure: ESOPHAGOGASTRODUODENOSCOPY (EGD);  Surgeon: [name], MD;  Location: [facility];  Service: General;  Laterality: N/A;  . INCISIONAL HERNIA REPAIR  09/04/2011   Procedure: HERNIA REPAIR INCISIONAL;  Surgeon: Madilyn Hook, DO;  Location: [facility];  Service: General;  Laterality: N/A;  debridment calcified mass  . INSERTION OF MESH  09/04/2011   retrorectus ultrapro "30x30cm"  . INSERTION OF MESH N/A 01/22/2017   Procedure: INSERTION OF MESH;  Surgeon: [name], MD;  Location: [facility];  Service: General;  Laterality: N/A;  . NISSEN FUNDOPLICATION     Open [id number]  . POLYPECTOMY    . STOMACH SURGERY  10/05/2010   Oversewing of gastric ulcer.  Dr Brantley Stage  . TOTAL HIP ARTHROPLASTY Left 04/24/2014   Procedure: LEFT TOTAL HIP ARTHROPLASTY;  Surgeon: Marchia Bond, MD;  Location: [facility];  Service: Orthopedics;  Laterality: Left;  . UPPER GASTROINTESTINAL ENDOSCOPY         Family History  Problem Relation Age of Onset  . Diabetes Mother   . Liver disease Mother   . Diabetes Father   . Heart disease Father   . Healthy Sister   . Healthy Brother   . Healthy Brother   . Colon cancer Neg Hx   . Stomach cancer Neg Hx   . Rectal cancer Neg Hx   . Esophageal cancer Neg Hx   . Colon polyps Neg Hx     Social History   Tobacco Use  . Smoking status: Never Smoker  . Smokeless tobacco: Current User    Types: Chew  . Tobacco comment: occ-Chew  Vaping Use  . Vaping Use: Never used  Substance Use Topics  . Alcohol use: Yes    Alcohol/week: 0.0 standard drinks    Comment: 09/04/11 "very seldom"  . Drug use: No    Home Medications Prior to Admission medications   Medication Sig Start Date End Date Taking? Authorizing Provider  acetaminophen (TYLENOL) 500 MG tablet Take 1,000 mg by mouth every 6 (six) hours as needed for mild pain or headache.   Yes [provider]  amLODipine (NORVASC) 10 MG tablet  Take 1 tablet (10 mg total) by mouth daily. 12/12/19  Yes Freada Bergeron, MD  aspirin EC 81 MG EC tablet Take 1 tablet (81 mg total) by mouth daily. 10/08/17  Yes Domenic Polite, MD  carvedilol (COREG) 3.125 MG tablet Take 3.125 mg by mouth 2 (two) times daily with a meal.   Yes [provider]  clonazePAM (KLONOPIN) 0.5 MG tablet TAKE 1 TABLET (0.5 MG TOTAL) BY MOUTH AT BEDTIME. 04/02/20  Yes  [name], NP  cyclobenzaprine (FLEXERIL) 10 MG tablet TAKE 1 TABLET BY MOUTH 3 TIMES EVERY DAY AS NEEDED FOR MUSCLE SPASMS 04/02/20  Yes Baity, Coralie Keens, NP  gabapentin (NEURONTIN) 300 MG capsule TAKE 1 CAPSULE (300 MG TOTAL) BY MOUTH 3 (THREE) TIMES DAILY. 02/09/20  Yes Baity, Coralie Keens, NP  HYDROcodone-acetaminophen (NORCO) 10-325 MG tablet Take 1 tablet by mouth every 6 (six) hours as needed. Patient taking differently: Take 1 tablet by mouth every 6 (six) hours as needed for moderate pain or severe pain. 04/09/18  Yes Pool, Mallie Mussel, MD  lisinopril (ZESTRIL) 20 MG tablet TAKE 1 TABLET (20 MG TOTAL) BY MOUTH DAILY. 12/27/19  Yes Baity, Coralie Keens, NP  meloxicam (MOBIC) 7.5 MG tablet Take 7.5 mg by mouth 2 (two) times daily. 10/27/19  Yes [provider]  ondansetron (ZOFRAN) 4 MG tablet TAKE 1 TABLET BY MOUTH EVERY 8 HOURS AS NEEDED FOR NAUSEA OR VOMITING. 03/01/20  Yes Baity, Coralie Keens, NP  pantoprazole (PROTONIX) 40 MG tablet TAKE 1 TABLET (40 MG TOTAL) BY MOUTH 2 (TWO) TIMES DAILY. 10/13/19  Yes [name], NP  rosuvastatin (CRESTOR) 20 MG tablet Take 1 tablet (20 mg total) by mouth daily. 11/01/19  Yes Freada Bergeron, MD  Topiramate ER (TROKENDI XR) 50 MG CP24 Take 50 mg by mouth at bedtime. 01/03/20  Yes Jaffe, Adam R, DO  zolpidem (AMBIEN CR) 12.5 MG CR tablet TAKE 1 TABLET (12.5 MG TOTAL) BY MOUTH AT BEDTIME. 04/02/20  Yes Baity, Coralie Keens, NP  fluticasone (FLONASE) 50 MCG/ACT nasal spray Place 2 sprays into both nostrils daily as needed for allergies. 06/14/18   [name], MD     Allergies    Dilaudid [hydromorphone hcl], Hydromorphone hcl, Metoclopramide, Metoclopramide hcl, Nsaids, and Benadryl [diphenhydramine]  Review of Systems   Review of Systems  Constitutional: Positive for diaphoresis. Negative for fever.  Respiratory: Positive for shortness of breath. Negative for cough.   Cardiovascular: Positive for chest pain.  Gastrointestinal: Negative for abdominal pain, nausea and vomiting.  Genitourinary: Negative for dysuria and hematuria.  Neurological: Negative for headaches.  All other systems reviewed and are negative.   Physical Exam Updated Vital Signs BP (!) 147/101   Pulse 92   Temp 98.7 F (37.1 C)   Resp 20   SpO2 98%   Physical Exam Vitals and nursing note reviewed.  Constitutional:      Appearance: Normal appearance. He is well-developed and well-nourished.  HENT:     Head: Normocephalic and atraumatic.     Mouth/Throat:     Mouth: Oropharynx is clear and moist and mucous membranes are normal.  Eyes:     General: Lids are normal.     Extraocular Movements: EOM normal.     Conjunctiva/sclera: Conjunctivae normal.     Pupils: Pupils are equal, round, and reactive to light.  Cardiovascular:     Rate and Rhythm: Normal rate and regular rhythm.     Pulses: Normal pulses.          Radial pulses are 2+ on the right side and 2+ on the left side.       Dorsalis pedis pulses are 2+ on the right side and 2+ on the left side.     Heart sounds: Normal heart sounds. No murmur heard. No friction rub. No gallop.   Pulmonary:     Effort: Pulmonary effort is normal.     Breath sounds: Normal breath sounds.     Comments:  Lungs clear to auscultation bilaterally.  Symmetric chest rise.  No wheezing, rales, rhonchi. Abdominal:     Palpations: Abdomen is soft. Abdomen is not rigid.     Tenderness: There is no abdominal tenderness. There is no guarding.     Comments: Abdomen is soft, non-distended, non-tender. No rigidity, No guarding. No  peritoneal signs.  Musculoskeletal:        General: Normal range of motion.     Cervical back: Full passive range of motion without pain.     Comments: Left lower ankle is slightly larger than right lower ankle which patient states is baseline.   Skin:    General: Skin is warm and dry.     Capillary Refill: Capillary refill takes less than 2 seconds.  Neurological:     Mental Status: He is alert and oriented to person, place, and time.  Psychiatric:        Mood and Affect: Mood and affect normal.        Speech: Speech normal.     ED Results / Procedures / Treatments   Labs (all labs ordered are listed, but only abnormal results are displayed) Labs Reviewed  CBC WITH DIFFERENTIAL/PLATELET  COMPREHENSIVE METABOLIC PANEL  D-DIMER, QUANTITATIVE  HIV ANTIBODY (ROUTINE TESTING W REFLEX)  CBC  CREATININE, SERUM  LIPID PANEL  BASIC METABOLIC PANEL  POC SARS CORONAVIRUS 2 AG -  ED  TROPONIN I (HIGH SENSITIVITY)  TROPONIN I (HIGH SENSITIVITY)    EKG None  Radiology DG Chest 2 View  Result Date: 04/09/2020 CLINICAL DATA:  Shortness of breath and chest heaviness EXAM: CHEST - 2 VIEW COMPARISON:  January 25, 2017 FINDINGS: There is mild lingular atelectasis. Lungs elsewhere clear. Heart size and pulmonary vascularity are normal. No adenopathy. Postoperative change noted in the lower cervical spine. No adenopathy. No bone lesions. No pneumothorax. IMPRESSION: Mild lingular atelectasis. No edema or airspace opacity. Cardiac silhouette within normal limits. Electronically Signed   By: Lowella Grip III M.D.   On: 04/09/2020 16:24    Procedures Procedures   Medications Ordered in ED Medications  nitroGLYCERIN (NITROSTAT) SL tablet 0.4 mg (has no administration in time range)  acetaminophen (TYLENOL) tablet 650 mg (has no administration in time range)  ondansetron (ZOFRAN) injection 4 mg (has no administration in time range)  ALPRAZolam (XANAX) tablet 0.25 mg (has no  administration in time range)  enoxaparin (LOVENOX) injection 40 mg (40 mg Subcutaneous Given 04/09/20 2220)  sodium chloride flush (NS) 0.9 % injection 3 mL (has no administration in time range)  sodium chloride flush (NS) 0.9 % injection 3 mL (has no administration in time range)  0.9 %  sodium chloride infusion (has no administration in time range)  zolpidem (AMBIEN) tablet 5 mg (has no administration in time range)  amLODipine (NORVASC) tablet 10 mg (10 mg Oral Given 04/09/20 2219)  aspirin EC tablet 81 mg (has no administration in time range)  carvedilol (COREG) tablet 3.125 mg (has no administration in time range)  clonazePAM (KLONOPIN) tablet 0.5 mg (0.5 mg Oral Given 04/09/20 2219)  cyclobenzaprine (FLEXERIL) tablet 10 mg (has no administration in time range)  fluticasone (FLONASE) 50 MCG/ACT nasal spray 2 spray (has no administration in time range)  gabapentin (NEURONTIN) capsule 300 mg (300 mg Oral Given 04/09/20 2219)  HYDROcodone-acetaminophen (NORCO) 10-325 MG per tablet 1 tablet (has no administration in time range)  lisinopril (ZESTRIL) tablet 20 mg (20 mg Oral Given 04/09/20 2223)  pantoprazole (PROTONIX) EC tablet 40 mg (40 mg  Oral Given 04/09/20 2220)  rosuvastatin (CRESTOR) tablet 20 mg (has no administration in time range)  Topiramate ER (TROKENDI XR) CP24 50 mg (has no administration in time range)    ED Course  I have reviewed the triage vital signs and the nursing notes.  Pertinent labs & imaging results that were available during my care of the patient were reviewed by me and considered in my medical decision making (see chart for details).    MDM Rules/Calculators/A&P                          50 year old male with past x-ray of hypertension, SVT who presents for evaluation of intermittent chest pain, shortness of breath.  He states he does have some anxiety and had noticed that when he gets super stressed, he would have some chest tightness.  He states that this felt  different and started becoming more frequent, more severe.  He states he has noticed it with exertional activities.  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  If he sits still, it will get better.  On initial arrival, he is afebrile, slightly tachycardic.  He is slightly hypertensive.  Vitals otherwise stable.  He has equal pulses in all 4 extremities.  Concern for ACS etiology such as stable versus unstable angina.  Low suspicion for PE but he is tachycardic and tells me his history of blood clots in his abdomen.  Will obtain D-dimer.  History/physical exam not concerning for dissection.  Review of records.  He had seen cardiology for evaluation some dyspnea on exertion.  He had a coronary CT scan which showed a score of 41 in October.  He is also had some several runs of SVT.  His scan also showed he had aortic root dilatation of 4.1 cm.  CXR shows mild lingular atelectasis.  His initial troponin is negative.  CMP is unremarkable.  CBC shows no leukocytosis or anemia.  D-dimer is negative.  Delta troponin negative.  Given that his symptoms happen with exertion been getting worse, we will consult cardiology.  Discussed with cardiology.  They will plan to evaluate patient in the ED.  Discussed with cardiology after ED evaluation.  They will plan to admit.  Portions of this note were generated with [employer]. Dictation errors may occur despite best attempts at proofreading.   Final Clinical Impression(s) / ED Diagnoses Final diagnoses:  Other chest pain    Rx / DC Orders ED Discharge Orders    None       Desma Mcgregor 04/09/20 2235    Quintella Reichert, MD 04/10/20 1438

## 2020-04-10 DIAGNOSIS — I200 Unstable angina: Secondary | ICD-10-CM | POA: Diagnosis not present

## 2020-04-10 LAB — LIPID PANEL
Cholesterol: 184 mg/dL (ref 0–200)
HDL: 54 mg/dL (ref >40)
LDL Cholesterol: 98 mg/dL (ref 0–99)
Total CHOL/HDL Ratio: 3.4 RATIO
Triglycerides: 159 mg/dL — ABNORMAL HIGH (ref <150)
VLDL: 32 mg/dL (ref 0–40)

## 2020-04-10 LAB — BASIC METABOLIC PANEL
Anion gap: 9 (ref 5–15)
BUN: 11 mg/dL (ref 6–20)
CO2: 21 mmol/L — ABNORMAL LOW (ref 22–32)
Calcium: 9.2 mg/dL (ref 8.9–10.3)
Chloride: 106 mmol/L (ref 98–111)
Creatinine, Ser: 1.04 mg/dL (ref 0.61–1.24)
GFR, Estimated: >60 mL/min (ref >60)
Glucose, Bld: 106 mg/dL — ABNORMAL HIGH (ref 70–99)
Potassium: 3.7 mmol/L (ref 3.5–5.1)
Sodium: 136 mmol/L (ref 135–145)

## 2020-04-10 LAB — HIV ANTIBODY (ROUTINE TESTING W REFLEX): HIV Screen 4th Generation wRfx: NONREACTIVE

## 2020-04-10 LAB — SARS CORONAVIRUS 2 BY RT PCR (HOSPITAL ORDER, PERFORMED IN ~~LOC~~ HOSPITAL LAB): SARS Coronavirus 2: NEGATIVE

## 2020-04-10 MED ORDER — SODIUM CHLORIDE 0.9 % WEIGHT BASED INFUSION: 1.0000 mL/kg/h | INTRAVENOUS | Status: DC

## 2020-04-10 MED ORDER — SODIUM CHLORIDE 0.9% FLUSH
3.0000 mL | Freq: Two times a day (BID) | INTRAVENOUS | Status: DC
  Administered 2020-04-10: 3 mL via INTRAVENOUS

## 2020-04-10 MED ORDER — ASPIRIN 81 MG PO CHEW: 81.0000 mg | CHEWABLE_TABLET | ORAL | Status: DC

## 2020-04-10 MED ORDER — ASPIRIN 81 MG PO CHEW
81.0000 mg | CHEWABLE_TABLET | ORAL | Status: AC
Start: 2020-04-11 — End: 2020-04-11
  Administered 2020-04-11: 81 mg via ORAL
  Filled 2020-04-10: qty 1

## 2020-04-10 MED ORDER — SODIUM CHLORIDE 0.9% FLUSH: 3.0000 mL | Freq: Two times a day (BID) | INTRAVENOUS | Status: DC

## 2020-04-10 MED ORDER — TOPIRAMATE 25 MG PO TABS
25.0000 mg | ORAL_TABLET | Freq: Two times a day (BID) | ORAL | Status: DC
  Administered 2020-04-10 (×2): 25 mg via ORAL
  Filled 2020-04-10 (×2): qty 1

## 2020-04-10 MED ORDER — ROSUVASTATIN CALCIUM 20 MG PO TABS
40.0000 mg | ORAL_TABLET | Freq: Every day | ORAL | Status: DC
  Administered 2020-04-10: 40 mg via ORAL
  Filled 2020-04-10: qty 2

## 2020-04-10 MED ORDER — SODIUM CHLORIDE 0.9 % WEIGHT BASED INFUSION
3.0000 mL/kg/h | INTRAVENOUS | Status: AC
Start: 2020-04-11 — End: 2020-04-11
  Administered 2020-04-11: 3 mL/kg/h via INTRAVENOUS

## 2020-04-10 MED ORDER — SODIUM CHLORIDE 0.9% FLUSH: 3.0000 mL | INTRAVENOUS | Status: DC | PRN

## 2020-04-10 MED ORDER — SODIUM CHLORIDE 0.9 % IV SOLN: 250.0000 mL | INTRAVENOUS | Status: DC | PRN

## 2020-04-10 MED ORDER — SODIUM CHLORIDE 0.9 % WEIGHT BASED INFUSION
3.0000 mL/kg/h | INTRAVENOUS | Status: DC
  Administered 2020-04-10: 3 mL/kg/h via INTRAVENOUS

## 2020-04-10 MED ORDER — SODIUM CHLORIDE 0.9 % WEIGHT BASED INFUSION: 3.0000 mL/kg/h | INTRAVENOUS | Status: DC

## 2020-04-10 MED ORDER — ASPIRIN 81 MG PO CHEW
81.0000 mg | CHEWABLE_TABLET | ORAL | Status: AC
  Administered 2020-04-10: 81 mg via ORAL
  Filled 2020-04-10: qty 1

## 2020-04-10 MED ORDER — SODIUM CHLORIDE 0.9 % WEIGHT BASED INFUSION
1.0000 mL/kg/h | INTRAVENOUS | Status: DC
  Administered 2020-04-10 (×2): 1 mL/kg/h via INTRAVENOUS

## 2020-04-10 NOTE — H&P (View-Only) (Signed)
Progress Note  Patient Name: [name] Date of Encounter: 04/10/2020  Primary Cardiologist: Freada Bergeron, MD  Subjective   No CP or SOB at rest. Back is sore from laying in ER bed (has h/o cervical disc disease).  Inpatient Medications    Scheduled Meds: . amLODipine  10 mg Oral Daily  . aspirin EC  81 mg Oral Daily  . carvedilol  3.125 mg Oral BID  . clonazePAM  0.5 mg Oral QHS  . enoxaparin (LOVENOX) injection  40 mg Subcutaneous Q24H  . gabapentin  300 mg Oral TID  . lisinopril  20 mg Oral Daily  . pantoprazole  40 mg Oral BID  . rosuvastatin  20 mg Oral Daily  . sodium chloride flush  3 mL Intravenous Q12H  . topiramate  25 mg Oral BID   Continuous Infusions: . sodium chloride    . sodium chloride 1 mL/kg/hr (04/10/20 0636)   PRN Meds: sodium chloride, acetaminophen, ALPRAZolam, cyclobenzaprine, fluticasone, HYDROcodone-acetaminophen, nitroGLYCERIN, ondansetron (ZOFRAN) IV, sodium chloride flush, zolpidem   Vital Signs    Vitals:   04/10/20 0800 04/10/20 0815 04/10/20 0834 04/10/20 0845  BP: 107/84 112/71  98/81  Pulse: 79 84  85  Resp: 13 15  16   Temp:   97.7 F (36.5 C)   TempSrc:   Oral   SpO2: 96% 97%  97%  Weight:      Height:        Intake/Output Summary (Last 24 hours) at 04/10/2020 0947 Last data filed at 04/10/2020 0635 Gross per 24 hour  Intake 384.3 ml  Output --  Net 384.3 ml   Last 3 Weights 04/10/2020 03/13/2020 01/03/2020  Weight (lbs) 242 lb 242 lb 244 lb 9.6 oz  Weight (kg) 109.77 kg 109.77 kg 110.95 kg     Telemetry    NSR -  Personally Reviewed  Physical Exam   GEN: No acute distress.  HEENT: Normocephalic, atraumatic, sclera non-icteric. Neck: No JVD or bruits. Cardiac: RRR no murmurs, rubs, or gallops.  Respiratory: Clear to auscultation bilaterally. Breathing is unlabored. GI: Soft, nontender, non-distended, BS +x 4. MS: no deformity. Extremities: No clubbing or cyanosis. No edema. Distal pedal pulses are 2+ and  equal bilaterally. Neuro:  AAOx3. Follows commands. Psych:  Responds to questions appropriately with a normal affect.  Labs    High Sensitivity Troponin:   Recent Labs  Lab 04/09/20 1553 04/09/20 1751  TROPONINIHS <2 2      Cardiac EnzymesNo results for input(s): TROPONINI in the last 168 hours. No results for input(s): TROPIPOC in the last 168 hours.   Chemistry Recent Labs  Lab 04/09/20 1553 04/09/20 2153 04/10/20 0402  NA 136  --  136  K 4.0  --  3.7  CL 105  --  106  CO2 24  --  21*  GLUCOSE 99  --  106*  BUN 13  --  11  CREATININE 1.21 1.05 1.04  CALCIUM 9.0  --  9.2  PROT 6.9  --   --   ALBUMIN 4.1  --   --   AST 29  --   --   ALT 26  --   --   ALKPHOS 54  --   --   BILITOT 0.5  --   --   GFRNONAA >60 >60 >60  ANIONGAP 7  --  9     Hematology Recent Labs  Lab 04/09/20 1553 04/09/20 2153  WBC 10.4 8.8  RBC 4.90 4.64  HGB 14.9 14.3  HCT 45.8 42.3  MCV 93.5 91.2  MCH 30.4 30.8  MCHC 32.5 33.8  RDW 13.2 13.9  PLT 310 314    BNPNo results for input(s): BNP, PROBNP in the last 168 hours.   DDimer  Recent Labs  Lab 04/09/20 1646  DDIMER 0.35     Radiology    DG Chest 2 View  Result Date: 04/09/2020 CLINICAL DATA:  Shortness of breath and chest heaviness EXAM: CHEST - 2 VIEW COMPARISON:  January 25, 2017 FINDINGS: There is mild lingular atelectasis. Lungs elsewhere clear. Heart size and pulmonary vascularity are normal. No adenopathy. Postoperative change noted in the lower cervical spine. No adenopathy. No bone lesions. No pneumothorax. IMPRESSION: Mild lingular atelectasis. No edema or airspace opacity. Cardiac silhouette within normal limits. Electronically Signed   By: Lowella Grip III M.D.   On: 04/09/2020 16:24    Cardiac Studies   2D echo 04/02/20  1. Left ventricular ejection fraction, by estimation, is 55 to 60%. The  left ventricle has normal function. The left ventricle has no regional  wall motion abnormalities. Left  ventricular diastolic parameters were  normal. The average left ventricular  global longitudinal strain is -25.2 %. The global longitudinal strain is  normal.  2. Right ventricular systolic function is normal. The right ventricular  size is normal.  3. The mitral valve is normal in structure. No evidence of mitral valve  regurgitation. No evidence of mitral stenosis.  4. The aortic valve is tricuspid. Aortic valve regurgitation is not  visualized. No aortic stenosis is present.  5. Aortic dilatation noted. There is mild dilatation of the aortic root,  measuring 41 mm.  6. The inferior vena cava is normal in size with greater than 50%  respiratory variability, suggesting right atrial pressure of 3 mmHg.   Comparison(s): 10/06/17 EF 55-60%.   Patient Profile     50 y.o. 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 Presented to [facility] with recent DOE with worsening exertional CP as well.  Assessment & Plan    1. Exertional chest pain concerning for unstable angina - hsTroponins negative - given quality of pain, cardiac cath recommended and scheduled today for definitive evaluation - risks/benefits discussed yesterday, also answered additional questions today (time TBD today per d/w cath lab) - continue ASA, BB (with hold parameters for BP), and titrate statin for goal LDL <70 (currently 98)  2. Remote GIB - CBC normal, no signs of recurrence  3. 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 HTN - soft BP noted at times overnight, but somewhat labile (range 91/67 to 146/117) - will hold lisinopril this AM and add hold parameters to amlodipine and carvedilol to hold for SBP<110 - likely be able to resume soon as last BP was 253 systolic but await cath result  5. Mildly dilated ascending aorta by echo  04/2020 - anticipate yearly follow-up closer to 04/2021   For questions or updates, please contact [facility] Please consult [email or web address] for contact info under Cardiology/STEMI.  Signed, Charlie Pitter, PA-C 04/10/2020, 9:47 AM    Personally seen and examined. Agree with above.   Unstable angina-cath today.  Possible progression of disease since coronary CT.  Overall feeling comfortable currently.  Candee Furbish, MD

## 2020-04-10 NOTE — Progress Notes (Signed)
Notified by cath lab due to unforeseen cath volume, unable to accomodate case today. Given clinical stability and negative troponins, patient's case was moved to first case tomorrow (7:30am) to avoid late cancellation this evening. We will allow him to eat. I called into patient's room to discuss and he was agreeable. Apologized for the delay and he was very understanding. Discussed with nurse to saline lock his fluids and offer diet. Cath orders re-entered (personally discussed cath with patient earlier today in ED).  Dayna Dunn PA-C

## 2020-04-10 NOTE — ED Provider Notes (Signed)
[facility]   [id number] 04/09/20 Arrival Time: 2725  ASSESSMENT & PLAN:  1. Chest tightness   2. Shortness of breath      ECG: Performed today and interpreted by me: normal EKG, normal sinus rhythm; no STEMI.  With current symptoms, recommend ED evaluation. Cannot r/o PE here. To ED via private vehicle; stable upon discharge.   Follow-up Information    Go to  [facility].   Specialty: Emergency Medicine Contact information: [address] [id number] [place] [phone]              Chest pain precautions given. Reviewed expectations re: course of current medical issues. Questions answered. Outlined signs and symptoms indicating need for more acute intervention. Patient verbalized understanding. After Visit Summary given.   SUBJECTIVE:  History from: patient. [name] is a 50 y.o. male who presents with complaint of intermittent chest pain described as chest tightness with assoc SOB at times. Gradual onset; approx 1 w ago. Some fatigue. Symptoms worsen with exertion; improve with rest. No assoc n/v. Questions some diaphoresis with symptoms. No symptoms that wake him from sleep. No LE edema. Denies: irregular heart beat, near-syncope, orthopnea, palpitations, paroxysmal nocturnal dyspnea and syncope. Recent illnesses: none. Fever: absent. Ambulatory without assistance. Self/OTC treatment: none. Illicit drug use: denies.  Social History   Tobacco Use  Smoking Status Never Smoker  Smokeless Tobacco Current User  . Types: Chew  Tobacco Comment   occ-Chew   Social History   Substance and Sexual Activity  Alcohol Use Yes  . Alcohol/week: 0.0 standard drinks   Comment: 09/04/11 "very seldom"    OBJECTIVE:  Vitals:   04/09/20 1515  BP: (!) 142/85  Pulse: (!) 109  Resp: 18  Temp: 98.9 F (37.2 C)  TempSrc: Oral  SpO2: 100%    General appearance: alert, oriented,  no acute distress but appears anxious Eyes: PERRLA; EOMI; conjunctivae normal HENT: normocephalic; atraumatic Neck: supple with FROM Lungs: without labored respirations; speaks full sentences without difficulty; CTAB Heart: slight tachycardia noted; regular Chest Wall: without tenderness to palpation Abdomen: soft, non-tender; no guarding or rebound tenderness Extremities: without edema; without calf swelling or tenderness; symmetrical without gross deformities Skin: warm and dry; without rash or lesions Neuro: normal gait Psychological: alert and cooperative; normal mood and affect   Allergies  Allergen Reactions  . Dilaudid [Hydromorphone Hcl] Rash and Other (See Comments)    Shakey  . Hydromorphone Hcl Other (See Comments) and Rash    Shakey  . Metoclopramide Other (See Comments), Rash, Swelling and Hives    Swelling in lips  . Metoclopramide Hcl Hives, Swelling, Rash and Other (See Comments)    Swelling in lips  . Nsaids     H/o bleeding gastric ulcers  . Benadryl [Diphenhydramine]     Causes patient to become hyper and pace    Past Medical History:  Diagnosis Date  . Anxiety    takes Xanax daily prn, panic attacks  . Chronic gastric ulcer with bleeding s/p suture repair 2012 09/29/2010   Head 3 EGD which failed to stop the bleeding. Had exploratory laparotomy on 10/04/2010. Possible cause of the gastric ulcer was NSAID use.   . Constipation    related to pain meds  . Depression   . Dizziness    occasionally  . Dyspnea    walking a bit  . Erosive esophagitis   . External hemorrhoid, bleeding   . Gastric ulcer  history of  . GERD (gastroesophageal reflux disease)    takes Protonix bid  . Hiatal hernia   . History of blood clots 2012   in abdomen  . History of blood transfusion 2012  . Incisional hernia s/p open repair w mesh Aug 2013 05/26/2011  . Joint pain    knees  . Nocturia    depends on amount of fluid he drinks  . Pneumonia 2018  . Pre-diabetes    . Primary localized osteoarthritis of left hip 04/24/2014  . Serrated polyp of colon   . Sleep apnea    no cpap  mild no cpap needed  . Stroke ([facility])   . Unstable angina ([facility]) 04/09/2020  . Upper GI bleed   . Ventral hernia    Social History   Socioeconomic History  . Marital status: Single    Spouse name: Not on file  . Number of children: 1  . Years of education: 42  . Highest education level: High school graduate  Occupational History  . Occupation: Maintenance - [employer]  Tobacco Use  . Smoking status: Never Smoker  . Smokeless tobacco: Current User    Types: Chew  . Tobacco comment: occ-Chew  Vaping Use  . Vaping Use: Never used  Substance and Sexual Activity  . Alcohol use: Yes    Alcohol/week: 0.0 standard drinks    Comment: 09/04/11 "very seldom"  . Drug use: No  . Sexual activity: Yes  Other Topics Concern  . Not on file  Social History Narrative   Lives with brother in a one story home.  Has one daughter.  Works in Theatre manager for [employer].  Education: high school.       Patient is left-handed.   Social Determinants of Health   Financial Resource Strain: Not on file  Food Insecurity: Not on file  Transportation Needs: Not on file  Physical Activity: Not on file  Stress: Not on file  Social Connections: Not on file  Intimate Partner Violence: Not on file   Family History  Problem Relation Age of Onset  . Diabetes Mother   . Liver disease Mother   . Diabetes Father   . Heart disease Father   . Healthy Sister   . Healthy Brother   . Healthy Brother   . Colon cancer Neg Hx   . Stomach cancer Neg Hx   . Rectal cancer Neg Hx   . Esophageal cancer Neg Hx   . Colon polyps Neg Hx    Past Surgical History:  Procedure Laterality Date  . ANTERIOR CERVICAL DECOMPRESSION/DISCECTOMY FUSION 4 LEVELS N/A 04/08/2018   Procedure: ACDF - C3-C4 - C4-C5 - C5-C6 - C6-C7;  Surgeon: Kary Kos, MD;  Location: [facility];  Service: Neurosurgery;  Laterality: N/A;  .  CARPAL TUNNEL RELEASE Left 04/03/2018  . COLONOSCOPY    . ESOPHAGEAL MANOMETRY N/A 10/19/2016   Procedure: ESOPHAGEAL MANOMETRY (EM);  Surgeon: Mauri Pole, MD;  Location: [facility];  Service: Endoscopy;  Laterality: N/A;  . ESOPHAGOGASTRODUODENOSCOPY    . ESOPHAGOGASTRODUODENOSCOPY N/A 01/22/2017   Procedure: ESOPHAGOGASTRODUODENOSCOPY (EGD);  Surgeon: [name], MD;  Location: [facility];  Service: General;  Laterality: N/A;  . INCISIONAL HERNIA REPAIR  09/04/2011   Procedure: HERNIA REPAIR INCISIONAL;  Surgeon: Madilyn Hook, DO;  Location: [facility];  Service: General;  Laterality: N/A;  debridment calcified mass  . INSERTION OF MESH  09/04/2011   retrorectus ultrapro "30x30cm"  . INSERTION OF MESH N/A 01/22/2017   Procedure:  INSERTION OF MESH;  Surgeon: [name], MD;  Location: [facility];  Service: General;  Laterality: N/A;  . NISSEN FUNDOPLICATION     Open 0981X  . POLYPECTOMY    . STOMACH SURGERY  10/05/2010   Oversewing of gastric ulcer.  Dr Brantley Stage  . TOTAL HIP ARTHROPLASTY Left 04/24/2014   Procedure: LEFT TOTAL HIP ARTHROPLASTY;  Surgeon: Marchia Bond, MD;  Location: [facility];  Service: Orthopedics;  Laterality: Left;  . UPPER GASTROINTESTINAL ENDOSCOPY       Vanessa Kick, MD 04/10/20 [phone]

## 2020-04-10 NOTE — Progress Notes (Addendum)
Progress Note  Patient Name: [name] Date of Encounter: 04/10/2020  Primary Cardiologist: Freada Bergeron, MD  Subjective   No CP or SOB at rest. Back is sore from laying in ER bed (has h/o cervical disc disease).  Inpatient Medications    Scheduled Meds: . amLODipine  10 mg Oral Daily  . aspirin EC  81 mg Oral Daily  . carvedilol  3.125 mg Oral BID  . clonazePAM  0.5 mg Oral QHS  . enoxaparin (LOVENOX) injection  40 mg Subcutaneous Q24H  . gabapentin  300 mg Oral TID  . lisinopril  20 mg Oral Daily  . pantoprazole  40 mg Oral BID  . rosuvastatin  20 mg Oral Daily  . sodium chloride flush  3 mL Intravenous Q12H  . topiramate  25 mg Oral BID   Continuous Infusions: . sodium chloride    . sodium chloride 1 mL/kg/hr (04/10/20 0636)   PRN Meds: sodium chloride, acetaminophen, ALPRAZolam, cyclobenzaprine, fluticasone, HYDROcodone-acetaminophen, nitroGLYCERIN, ondansetron (ZOFRAN) IV, sodium chloride flush, zolpidem   Vital Signs    Vitals:   04/10/20 0800 04/10/20 0815 04/10/20 0834 04/10/20 0845  BP: 107/84 112/71  98/81  Pulse: 79 84  85  Resp: 13 15  16   Temp:   97.7 F (36.5 C)   TempSrc:   Oral   SpO2: 96% 97%  97%  Weight:      Height:        Intake/Output Summary (Last 24 hours) at 04/10/2020 0947 Last data filed at 04/10/2020 0635 Gross per 24 hour  Intake 384.3 ml  Output --  Net 384.3 ml   Last 3 Weights 04/10/2020 03/13/2020 01/03/2020  Weight (lbs) 242 lb 242 lb 244 lb 9.6 oz  Weight (kg) 109.77 kg 109.77 kg 110.95 kg     Telemetry    NSR -  Personally Reviewed  Physical Exam   GEN: No acute distress.  HEENT: Normocephalic, atraumatic, sclera non-icteric. Neck: No JVD or bruits. Cardiac: RRR no murmurs, rubs, or gallops.  Respiratory: Clear to auscultation bilaterally. Breathing is unlabored. GI: Soft, nontender, non-distended, BS +x 4. MS: no deformity. Extremities: No clubbing or cyanosis. No edema. Distal pedal pulses are 2+ and  equal bilaterally. Neuro:  AAOx3. Follows commands. Psych:  Responds to questions appropriately with a normal affect.  Labs    High Sensitivity Troponin:   Recent Labs  Lab 04/09/20 1553 04/09/20 1751  TROPONINIHS <2 2      Cardiac EnzymesNo results for input(s): TROPONINI in the last 168 hours. No results for input(s): TROPIPOC in the last 168 hours.   Chemistry Recent Labs  Lab 04/09/20 1553 04/09/20 2153 04/10/20 0402  NA 136  --  136  K 4.0  --  3.7  CL 105  --  106  CO2 24  --  21*  GLUCOSE 99  --  106*  BUN 13  --  11  CREATININE 1.21 1.05 1.04  CALCIUM 9.0  --  9.2  PROT 6.9  --   --   ALBUMIN 4.1  --   --   AST 29  --   --   ALT 26  --   --   ALKPHOS 54  --   --   BILITOT 0.5  --   --   GFRNONAA >60 >60 >60  ANIONGAP 7  --  9     Hematology Recent Labs  Lab 04/09/20 1553 04/09/20 2153  WBC 10.4 8.8  RBC 4.90 4.64  HGB 14.9 14.3  HCT 45.8 42.3  MCV 93.5 91.2  MCH 30.4 30.8  MCHC 32.5 33.8  RDW 13.2 13.9  PLT 310 314    BNPNo results for input(s): BNP, PROBNP in the last 168 hours.   DDimer  Recent Labs  Lab 04/09/20 1646  DDIMER 0.35     Radiology    DG Chest 2 View  Result Date: 04/09/2020 CLINICAL DATA:  Shortness of breath and chest heaviness EXAM: CHEST - 2 VIEW COMPARISON:  January 25, 2017 FINDINGS: There is mild lingular atelectasis. Lungs elsewhere clear. Heart size and pulmonary vascularity are normal. No adenopathy. Postoperative change noted in the lower cervical spine. No adenopathy. No bone lesions. No pneumothorax. IMPRESSION: Mild lingular atelectasis. No edema or airspace opacity. Cardiac silhouette within normal limits. Electronically Signed   By: Lowella Grip III M.D.   On: 04/09/2020 16:24    Cardiac Studies   2D echo 04/02/20  1. Left ventricular ejection fraction, by estimation, is 55 to 60%. The  left ventricle has normal function. The left ventricle has no regional  wall motion abnormalities. Left  ventricular diastolic parameters were  normal. The average left ventricular  global longitudinal strain is -25.2 %. The global longitudinal strain is  normal.  2. Right ventricular systolic function is normal. The right ventricular  size is normal.  3. The mitral valve is normal in structure. No evidence of mitral valve  regurgitation. No evidence of mitral stenosis.  4. The aortic valve is tricuspid. Aortic valve regurgitation is not  visualized. No aortic stenosis is present.  5. Aortic dilatation noted. There is mild dilatation of the aortic root,  measuring 41 mm.  6. The inferior vena cava is normal in size with greater than 50%  respiratory variability, suggesting right atrial pressure of 3 mmHg.   Comparison(s): 10/06/17 EF 55-60%.   Patient Profile     50 y.o. 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 Presented to [facility] with recent DOE with worsening exertional CP as well.  Assessment & Plan    1. Exertional chest pain concerning for unstable angina - hsTroponins negative - given quality of pain, cardiac cath recommended and scheduled today for definitive evaluation - risks/benefits discussed yesterday, also answered additional questions today (time TBD today per d/w cath lab) - continue ASA, BB (with hold parameters for BP), and titrate statin for goal LDL <70 (currently 98)  2. Remote GIB - CBC normal, no signs of recurrence  3. 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 HTN - soft BP noted at times overnight, but somewhat labile (range 91/67 to 146/117) - will hold lisinopril this AM and add hold parameters to amlodipine and carvedilol to hold for SBP<110 - likely be able to resume soon as last BP was 502 systolic but await cath result  5. Mildly dilated ascending aorta by echo  04/2020 - anticipate yearly follow-up closer to 04/2021   For questions or updates, please contact [facility] Please consult [email or web address] for contact info under Cardiology/STEMI.  Signed, Charlie Pitter, PA-C 04/10/2020, 9:47 AM    Personally seen and examined. Agree with above.   Unstable angina-cath today.  Possible progression of disease since coronary CT.  Overall feeling comfortable currently.  Candee Furbish, MD

## 2020-04-10 NOTE — ED Notes (Signed)
Cards at bedside

## 2020-04-11 ENCOUNTER — Encounter (HOSPITAL_COMMUNITY): Payer: Self-pay | Admitting: Cardiovascular Disease

## 2020-04-11 ENCOUNTER — Encounter (HOSPITAL_COMMUNITY)
Admission: EM | Disposition: A | Discharge status: Home / Self Care | Payer: Self-pay | Source: Home / Self Care | Attending: Emergency Medicine

## 2020-04-11 DIAGNOSIS — I77810 Thoracic aortic ectasia: Secondary | ICD-10-CM

## 2020-04-11 DIAGNOSIS — I200 Unstable angina: Secondary | ICD-10-CM | POA: Diagnosis not present

## 2020-04-11 HISTORY — PX: LEFT HEART CATH AND CORONARY ANGIOGRAPHY: CATH118249

## 2020-04-11 LAB — CBC
HCT: 40.5 % (ref 39.0–52.0)
Hemoglobin: 13.8 g/dL (ref 13.0–17.0)
MCH: 31.2 pg (ref 26.0–34.0)
MCHC: 34.1 g/dL (ref 30.0–36.0)
MCV: 91.4 fL (ref 80.0–100.0)
Platelets: 256 10*3/uL (ref 150–400)
RBC: 4.43 MIL/uL (ref 4.22–5.81)
RDW: 13.2 % (ref 11.5–15.5)
WBC: 8.2 10*3/uL (ref 4.0–10.5)
nRBC: 0 % (ref 0.0–0.2)

## 2020-04-11 LAB — BASIC METABOLIC PANEL
Anion gap: 6 (ref 5–15)
BUN: 16 mg/dL (ref 6–20)
CO2: 25 mmol/L (ref 22–32)
Calcium: 8.6 mg/dL — ABNORMAL LOW (ref 8.9–10.3)
Chloride: 106 mmol/L (ref 98–111)
Creatinine, Ser: 1.28 mg/dL — ABNORMAL HIGH (ref 0.61–1.24)
GFR, Estimated: >60 mL/min (ref >60)
Glucose, Bld: 110 mg/dL — ABNORMAL HIGH (ref 70–99)
Potassium: 3.7 mmol/L (ref 3.5–5.1)
Sodium: 137 mmol/L (ref 135–145)

## 2020-04-11 SURGERY — LEFT HEART CATH AND CORONARY ANGIOGRAPHY
Anesthesia: LOCAL

## 2020-04-11 MED ORDER — MIDAZOLAM HCL 2 MG/2ML IJ SOLN
INTRAMUSCULAR | Status: DC | PRN
  Administered 2020-04-11: 1 mg via INTRAVENOUS

## 2020-04-11 MED ORDER — SODIUM CHLORIDE 0.9% FLUSH: 3.0000 mL | INTRAVENOUS | Status: DC | PRN

## 2020-04-11 MED ORDER — SODIUM CHLORIDE 0.9 % IV SOLN: 250.0000 mL | INTRAVENOUS | Status: DC | PRN

## 2020-04-11 MED ORDER — VERAPAMIL HCL 2.5 MG/ML IV SOLN
INTRAVENOUS | Status: AC
  Filled 2020-04-11: qty 2

## 2020-04-11 MED ORDER — HEPARIN (PORCINE) IN NACL 1000-0.9 UT/500ML-% IV SOLN
INTRAVENOUS | Status: AC
  Filled 2020-04-11: qty 1500

## 2020-04-11 MED ORDER — IOHEXOL 350 MG/ML SOLN
INTRAVENOUS | Status: DC | PRN
  Administered 2020-04-11: 40 mL

## 2020-04-11 MED ORDER — ONDANSETRON HCL 4 MG/2ML IJ SOLN: 4.0000 mg | Freq: Four times a day (QID) | INTRAMUSCULAR | Status: DC | PRN

## 2020-04-11 MED ORDER — HEPARIN (PORCINE) IN NACL 1000-0.9 UT/500ML-% IV SOLN
INTRAVENOUS | Status: DC | PRN
  Administered 2020-04-11 (×2): 500 mL

## 2020-04-11 MED ORDER — HEPARIN SODIUM (PORCINE) 1000 UNIT/ML IJ SOLN
INTRAMUSCULAR | Status: DC | PRN
  Administered 2020-04-11: 6000 [IU] via INTRAVENOUS

## 2020-04-11 MED ORDER — SODIUM CHLORIDE 0.9% FLUSH: 3.0000 mL | Freq: Two times a day (BID) | INTRAVENOUS | Status: DC

## 2020-04-11 MED ORDER — LIDOCAINE HCL (PF) 1 % IJ SOLN
INTRAMUSCULAR | Status: DC | PRN
  Administered 2020-04-11: 2 mL

## 2020-04-11 MED ORDER — HEPARIN SODIUM (PORCINE) 1000 UNIT/ML IJ SOLN
INTRAMUSCULAR | Status: AC
  Filled 2020-04-11: qty 1

## 2020-04-11 MED ORDER — FENTANYL CITRATE (PF) 100 MCG/2ML IJ SOLN
INTRAMUSCULAR | Status: DC | PRN
  Administered 2020-04-11: 25 ug via INTRAVENOUS

## 2020-04-11 MED ORDER — LABETALOL HCL 5 MG/ML IV SOLN: 10.0000 mg | INTRAVENOUS | Status: DC | PRN

## 2020-04-11 MED ORDER — LIDOCAINE HCL (PF) 1 % IJ SOLN
INTRAMUSCULAR | Status: AC
  Filled 2020-04-11: qty 30

## 2020-04-11 MED ORDER — MIDAZOLAM HCL 2 MG/2ML IJ SOLN
INTRAMUSCULAR | Status: AC
  Filled 2020-04-11: qty 2

## 2020-04-11 MED ORDER — VERAPAMIL HCL 2.5 MG/ML IV SOLN
INTRA_ARTERIAL | Status: DC | PRN
  Administered 2020-04-11: 7.5 mL via INTRA_ARTERIAL

## 2020-04-11 MED ORDER — ACETAMINOPHEN 325 MG PO TABS: 650.0000 mg | ORAL_TABLET | ORAL | Status: DC | PRN

## 2020-04-11 MED ORDER — HYDRALAZINE HCL 20 MG/ML IJ SOLN: 10.0000 mg | INTRAMUSCULAR | Status: DC | PRN

## 2020-04-11 MED ORDER — SODIUM CHLORIDE 0.9 % IV SOLN: INTRAVENOUS | Status: DC

## 2020-04-11 MED ORDER — FENTANYL CITRATE (PF) 100 MCG/2ML IJ SOLN
INTRAMUSCULAR | Status: AC
  Filled 2020-04-11: qty 2

## 2020-04-11 MED ORDER — NITROGLYCERIN 1 MG/10 ML FOR IR/CATH LAB
INTRA_ARTERIAL | Status: AC
  Filled 2020-04-11: qty 10

## 2020-04-11 SURGICAL SUPPLY — 11 items
CATH INFINITI 5 FR JL3.5 (CATHETERS) ×1 IMPLANT
CATH OPTITORQUE TIG 4.0 5F (CATHETERS) ×1 IMPLANT
DEVICE RAD COMP TR BAND LRG (VASCULAR PRODUCTS) ×1 IMPLANT
GLIDESHEATH SLEND A-KIT 6F 22G (SHEATH) ×1 IMPLANT
GUIDEWIRE INQWIRE 1.5J.035X260 (WIRE) IMPLANT
INQWIRE 1.5J .035X260CM (WIRE) ×2
KIT HEART LEFT (KITS) ×2 IMPLANT
PACK CARDIAC CATHETERIZATION (CUSTOM PROCEDURE TRAY) ×2 IMPLANT
TRANSDUCER W/STOPCOCK (MISCELLANEOUS) ×2 IMPLANT
TUBING CIL FLEX 10 FLL-RA (TUBING) ×2 IMPLANT
WIRE HI TORQ VERSACORE-J 145CM (WIRE) ×1 IMPLANT

## 2020-04-11 NOTE — Discharge Instructions (Signed)
Radial Site Care ELEVATE RIGHT WRIST 24 HOURS   This sheet gives you information about how to care for yourself after your procedure. Your health care provider may also give you more specific instructions. If you have problems or questions, contact your health care provider. What can I expect after the procedure? After the procedure, it is common to have:  Bruising and tenderness at the catheter insertion area. Follow these instructions at home: Medicines  Take over-the-counter and prescription medicines only as told by your health care provider. Insertion site care  Check your insertion site every day for signs of infection. Check for: ? Redness, swelling, or pain. ? Fluid or blood. ? Pus or a bad smell. ? Warmth.  Do not take baths, swim, or use a hot tub until your health care provider approves.  You may shower 24-48 hours after the procedure, or as directed by your health care provider. ? Remove the dressing and gently wash the site with plain soap and water. ? Pat the area dry with a clean towel. ? Do not rub the site. That could cause bleeding.  Do not apply powder or lotion to the site. Activity  For 24 hours after the procedure, or as directed by your health care provider: ? Do not flex or bend the affected arm. ? Do not push or pull heavy objects with the affected arm. ? Do not drive yourself home from the hospital or clinic. You may drive 24 hours after the procedure unless your health care provider tells you not to. ? Do not operate machinery or power tools.  Do not lift anything that is heavier than 10 lb (4.5 kg), or the limit that you are told, until your health care provider says that it is safe.  Ask your health care provider when it is okay to: ? Return to work or school. ? Resume usual physical activities or sports. ? Resume sexual activity.   General instructions  If the catheter site starts to bleed, raise your arm and put firm pressure on the site. If  the bleeding does not stop, get help right away. This is a medical emergency.  If you went home on the same day as your procedure, a responsible adult should be with you for the first 24 hours after you arrive home.  Keep all follow-up visits as told by your health care provider. This is important. Contact a health care provider if:  You have a fever.  You have redness, swelling, or yellow drainage around your insertion site. Get help right away if:  You have unusual pain at the radial site.  The catheter insertion area swells very fast.  The insertion area is bleeding, and the bleeding does not stop when you hold steady pressure on the area.  Your arm or hand becomes pale, cool, tingly, or numb. These symptoms may represent a serious problem that is an emergency. Do not wait to see if the symptoms will go away. Get medical help right away. Call your local emergency services (911 in the [place] Do not drive yourself to the hospital. Summary  After the procedure, it is common to have bruising and tenderness at the site.  Follow instructions from your health care provider about how to take care of your radial site wound. Check the wound every day for signs of infection.  Do not lift anything that is heavier than 10 lb (4.5 kg), or the limit that you are told, until your health care provider says  that it is safe. This information is not intended to replace advice given to you by your health care provider. Make sure you discuss any questions you have with your health care provider. Document Revised: 02/24/2017 Document Reviewed: 02/24/2017 Elsevier Patient Education  2021 [employer].

## 2020-04-11 NOTE — Interval H&P Note (Signed)
Cath Lab Visit (complete for each Cath Lab visit)  Clinical Evaluation Leading to the Procedure:   ACS: No.  Non-ACS:    Anginal Classification: CCS II  Anti-ischemic medical therapy: Minimal Therapy (1 class of medications)  Non-Invasive Test Results: No non-invasive testing performed  Prior CABG: No previous CABG      History and Physical Interval Note:  04/11/2020 7:48 AM  [name]  has presented today for surgery, with the diagnosis of unstable angina.  The various methods of treatment have been discussed with the patient and family. After consideration of risks, benefits and other options for treatment, the patient has consented to  Procedure(s): LEFT HEART CATH AND CORONARY ANGIOGRAPHY (N/A) as a surgical intervention.  The patient's history has been reviewed, patient examined, no change in status, stable for surgery.  I have reviewed the patient's chart and labs.  Questions were answered to the patient's satisfaction.     Quay Burow

## 2020-04-11 NOTE — Discharge Summary (Addendum)
Discharge Summary    Patient ID: [name] MRN: [id number]; DOB: November 03, 1970  Admit date: 04/09/2020 Discharge date: 04/11/2020  PCP:  [name], NP   [place]  Cardiologist:  Freada Bergeron, MD    Discharge Diagnoses    Principal Problem:   Unstable angina [facility]) Active Problems:   HLD (hyperlipidemia)   HTN (hypertension)   Dilated aortic root ([place])    PSVT  Diagnostic Studies/Procedures    LEFT HEART CATH AND CORONARY ANGIOGRAPHY  IMPRESSION: [name] has normal coronary arteries and normal filling pressures.  I believe his chest pain is noncardiac.  His 2D echo revealed normal LV systolic function.  The sheath was removed and a TR band was placed on the right wrist to achieve patent hemostasis.  The patient left lab in stable condition.  He will most likely be discharged home later today and will follow up as an outpatient.    History of Present Illness     [name] is a 50 y.o. male with with a history of non-obs CAD by CT 2021, UGI bleed 2012, GERD, anxiety, depression, brief SVT by monitor, FH CAD presented for CP evaluation.   Monitor 01/2020 with 3 episode of SVT and rate PVCs & PACs.  He saw Dr. Johney Frame 03/13/2020, he was complaining of dyspnea on exertion, but no chest pain.  Follow up echo showed an normal EF.  Presented with exertional chest pain for several weeks. The symptoms are a pressure/tightness and are relieved by rest.  The symptoms have progressed to the point that he cannot walk 100 feet without getting chest pain.  He does not have nitroglycerin has not taken it. The chest pain is associated with shortness of breath, but no nausea, vomiting, or diaphoresis. He has had panic attacks in the past, but states those symptoms are different from what he is having now. Initial troponin negative.  Hospital Course     Consultants: None  Admitted for symptoms concerning for [place].  Troponin remained negative.  Home  lisinopril held secondary to soft blood pressure.  Continued home amlodipine and carvedilol.  Unable to accommodate cardiac cath on March 9 due to cardiac cath.  He underwent catheterization by Dr. Gwenlyn Found this morning showing no evidence of CAD.  Felt noncardiac chest pain.  Patient was held in short stay for discharge.  No recurrent chest pain while admission.  No arrhythmia on telemetry.  Blood pressure improving but will continue to hold home lisinopril.  Restart in outpatient setting if needed.  Continue amlodipine and carvedilol.  Has follow-up with PCP next week for noncardiac evaluation of chest pain.  The patient been seen by Dr. Marlou Porch today and deemed ready for discharge home. All follow-up appointments have been scheduled. Discharge medications are listed below.    Did the patient have an acute coronary syndrome (MI, NSTEMI, STEMI, etc) this admission?:  No                               Did the patient have a percutaneous coronary intervention (stent / angioplasty)?:  No.     Discharge Vitals Blood pressure 118/73, pulse 86, temperature 98.1 F (36.7 C), temperature source Oral, resp. rate 16, height 5\' 9"  (1.753 m), weight 105.1 kg, SpO2 99 %.  Filed Weights   04/10/20 0400 04/10/20 1229 04/11/20 0416  Weight: 109.8 kg 105.6 kg 105.1 kg   Physical Exam  Vitals reviewed.  Constitutional:      Appearance: He is well-developed.  HENT:     Head: Normocephalic and atraumatic.  Eyes:     Pupils: Pupils are equal, round, and reactive to light.  Cardiovascular:     Rate and Rhythm: Normal rate and regular rhythm.     Comments: R radial with TR band Pulmonary:     Effort: Pulmonary effort is normal.     Breath sounds: Normal breath sounds.  Abdominal:     General: Bowel sounds are normal.     Palpations: Abdomen is soft.  Musculoskeletal:        General: Normal range of motion.     Cervical back: Normal range of motion and neck supple.  Skin:    General: Skin is warm and dry.   Neurological:     General: No focal deficit present.     Mental Status: He is alert and oriented to person, place, and time.  Psychiatric:        Mood and Affect: Mood normal.        Behavior: Behavior normal.    Labs & Radiologic Studies    CBC Recent Labs    04/09/20 1553 04/09/20 2153 04/11/20 0320  WBC 10.4 8.8 8.2  NEUTROABS 6.7  --   --   HGB 14.9 14.3 13.8  HCT 45.8 42.3 40.5  MCV 93.5 91.2 91.4  PLT 310 314 932   Basic Metabolic Panel Recent Labs    04/10/20 0402 04/11/20 0320  NA 136 137  K 3.7 3.7  CL 106 106  CO2 21* 25  GLUCOSE 106* 110*  BUN 11 16  CREATININE 1.04 1.28*  CALCIUM 9.2 8.6*   Liver Function Tests Recent Labs    04/09/20 1553  AST 29  ALT 26  ALKPHOS 54  BILITOT 0.5  PROT 6.9  ALBUMIN 4.1   High Sensitivity Troponin:   Recent Labs  Lab 04/09/20 1553 04/09/20 1751  TROPONINIHS <2 2  D-Dimer Recent Labs    04/09/20 1646  DDIMER 0.35   Fasting Lipid Panel Recent Labs    04/10/20 0402  CHOL 184  HDL 54  LDLCALC 98  TRIG 159*  CHOLHDL 3.4   _____________  DG Chest 2 View  Result Date: 04/09/2020 CLINICAL DATA:  Shortness of breath and chest heaviness EXAM: CHEST - 2 VIEW COMPARISON:  January 25, 2017 FINDINGS: There is mild lingular atelectasis. Lungs elsewhere clear. Heart size and pulmonary vascularity are normal. No adenopathy. Postoperative change noted in the lower cervical spine. No adenopathy. No bone lesions. No pneumothorax. IMPRESSION: Mild lingular atelectasis. No edema or airspace opacity. Cardiac silhouette within normal limits. Electronically Signed   By: Lowella Grip III M.D.   On: 04/09/2020 16:24   CARDIAC CATHETERIZATION  Result Date: 04/11/2020 [name] is a 50 y.o. male  [id number] LOCATION:  FACILITY: [facility] PHYSICIAN: Quay Burow, M.D. 21-Oct-1970 DATE OF PROCEDURE:  04/11/2020 DATE OF DISCHARGE: CARDIAC CATHETERIZATION History obtained from chart review.50 y.o. male with nonobstructive  CAD by CT 2021, UGIB 2012, prior CVA 2019, mild carotid disease 1-39% 2019, GERD, HTN, anxiety, depression, brief SVT by monitor, dilation of aortic root, and family history of CAD. Presented to [facility] with recent DOE with worsening exertional CP as well.  His enzymes were negative.  Because the nature of his symptoms and risk factors decision was made to refer to cardiac catheterization to define his anatomy and physiology.   Mr. Gayton has normal coronary arteries  and normal filling pressures.  I believe his chest pain is noncardiac.  His 2D echo revealed normal LV systolic function.  The sheath was removed and a TR band was placed on the right wrist to achieve patent hemostasis.  The patient left lab in stable condition.  He will most likely be discharged home later today and will follow up as an outpatient. Quay Burow. MD, [facility] 04/11/2020 8:19 AM   ECHOCARDIOGRAM COMPLETE  Result Date: 04/02/2020    ECHOCARDIOGRAM REPORT   Patient Name:   [name] Date of Exam: 04/02/2020 Medical Rec #:  [id number]     Height:       69.0 in Accession #:    [id number]    Weight:       242.0 lb Date of Birth:  25-May-1970     BSA:          2.240 m Patient Age:    1 years      BP:           110/74 mmHg Patient Gender: M             HR:           76 bpm. Exam Location:  [place] Procedure: 2D Echo, 3D Echo, Cardiac Doppler, Color Doppler and Strain Analysis Indications:    R06.00 Dyspnea on exertion  History:        Patient has prior history of Echocardiogram examinations, most                 recent 10/06/2017. Stroke; Risk Factors:Hypertension and                 Dyslipidemia.  Sonographer:    Jessee Avers, RDCS Referring Phys: [id number] [place]  1. Left ventricular ejection fraction, by estimation, is 55 to 60%. The left ventricle has normal function. The left ventricle has no regional wall motion abnormalities. Left ventricular diastolic parameters were normal. The average left ventricular global  longitudinal strain is -25.2 %. The global longitudinal strain is normal.  2. Right ventricular systolic function is normal. The right ventricular size is normal.  3. The mitral valve is normal in structure. No evidence of mitral valve regurgitation. No evidence of mitral stenosis.  4. The aortic valve is tricuspid. Aortic valve regurgitation is not visualized. No aortic stenosis is present.  5. Aortic dilatation noted. There is mild dilatation of the aortic root, measuring 41 mm.  6. The inferior vena cava is normal in size with greater than 50% respiratory variability, suggesting right atrial pressure of 3 mmHg. Comparison(s): 10/06/17 EF 55-60%. FINDINGS  Left Ventricle: Left ventricular ejection fraction, by estimation, is 55 to 60%. The left ventricle has normal function. The left ventricle has no regional wall motion abnormalities. The average left ventricular global longitudinal strain is -25.2 %. The global longitudinal strain is normal. The left ventricular internal cavity size was normal in size. There is no left ventricular hypertrophy. Left ventricular diastolic parameters were normal. Right Ventricle: The right ventricular size is normal. No increase in right ventricular wall thickness. Right ventricular systolic function is normal. Left Atrium: Left atrial size was normal in size. Right Atrium: Right atrial size was normal in size. Pericardium: There is no evidence of pericardial effusion. Mitral Valve: The mitral valve is normal in structure. No evidence of mitral valve stenosis. Tricuspid Valve: The tricuspid valve is normal in structure. Tricuspid valve regurgitation is not demonstrated. No evidence of tricuspid stenosis. Aortic  Valve: The aortic valve is tricuspid. Aortic valve regurgitation is not visualized. No aortic stenosis is present. Pulmonic Valve: The pulmonic valve was normal in structure. Pulmonic valve regurgitation is not visualized. No evidence of pulmonic stenosis. Aorta: Aortic  dilatation noted. There is mild dilatation of the aortic root, measuring 41 mm. Venous: The inferior vena cava is normal in size with greater than 50% respiratory variability, suggesting right atrial pressure of 3 mmHg. IAS/Shunts: No atrial level shunt detected by color flow Doppler. LEFT VENTRICLE PLAX 2D LVIDd:         4.10 cm  Diastology LVIDs:         2.90 cm  LV e' medial:    8.92 cm/s LV PW:         0.80 cm  LV E/e' medial:  8.8 LV IVS:        0.70 cm  LV e' lateral:   11.90 cm/s LVOT diam:     2.60 cm  LV E/e' lateral: 6.6 LV SV:         105 LV SV Index:   47       2D Longitudinal Strain LVOT Area:     5.31 cm 2D Strain GLS (A2C):   -26.1 %                         2D Strain GLS (A3C):   -25.0 %                         2D Strain GLS (A4C):   -24.6 %                         2D Strain GLS Avg:     -25.2 %                          3D Volume EF:                         3D EF:        61 %                         LV EDV:       166 ml                         LV ESV:       65 ml                         LV SV:        101 ml RIGHT VENTRICLE RV Basal diam:  3.60 cm RV S prime:     11.40 cm/s TAPSE (M-mode): 2.1 cm LEFT ATRIUM             Index       RIGHT ATRIUM           Index LA diam:        3.30 cm 1.47 cm/m  RA Pressure: 3.00 mmHg LA Vol (A2C):   34.0 ml 15.18 ml/m RA Area:     13.60 cm LA Vol (A4C):   24.7 ml 11.03 ml/m RA Volume:   30.80 ml  13.75 ml/m LA Biplane Vol: 29.7 ml 13.26 ml/m  AORTIC VALVE LVOT Vmax:  92.00 cm/s LVOT Vmean:  60.700 cm/s LVOT VTI:    0.198 m  AORTA Ao Root diam: 4.30 cm Ao Asc diam:  3.60 cm MITRAL VALVE               TRICUSPID VALVE                            Estimated RAP:  3.00 mmHg  MV E velocity: 78.40 cm/s  SHUNTS MV A velocity: 68.60 cm/s  Systemic VTI:  0.20 m MV E/A ratio:  1.14        Systemic Diam: 2.60 cm Skeet Latch MD Electronically signed by Skeet Latch MD Signature Date/Time: 04/02/2020/12:40:11 PM    Final    Disposition   Pt is being discharged home  today in good condition.  Follow-up Plans & Appointments     Follow-up Information    Freada Bergeron, MD. Go on 04/26/2020.   Specialties: Cardiology, Radiology Why: @8 :20am for hospital follow up  Contact information: [address] [address] [phone]              Discharge Instructions    Diet - low sodium heart healthy   Complete by: As directed    Discharge instructions   Complete by: As directed    No driving for 48 hours.  No lifting over 5 lbs for 1 week. No sexual activity for 1 week. You may return to work on 04/15/2020.   Increase activity slowly   Complete by: As directed       Discharge Medications   Allergies as of 04/11/2020      Reactions   Dilaudid [hydromorphone Hcl] Rash, Other (See Comments)   Shakey   Hydromorphone Hcl Other (See Comments), Rash   Shakey   Metoclopramide Other (See Comments), Rash, Swelling, Hives   Swelling in lips   Metoclopramide Hcl Hives, Swelling, Rash, Other (See Comments)   Swelling in lips   Nsaids    H/o bleeding gastric ulcers   Benadryl [diphenhydramine]    Causes patient to become hyper and pace      Medication List    STOP taking these medications   acetaminophen 500 MG tablet Commonly known as: TYLENOL   lisinopril 20 MG tablet Commonly known as: ZESTRIL     TAKE these medications   amLODipine 10 MG tablet Commonly known as: NORVASC Take 1 tablet (10 mg total) by mouth daily.   aspirin 81 MG EC tablet Take 1 tablet (81 mg total) by mouth daily.   carvedilol 3.125 MG tablet Commonly known as: COREG Take 3.125 mg by mouth 2 (two) times daily with a meal.   clonazePAM 0.5 MG tablet Commonly known as: KLONOPIN TAKE 1 TABLET (0.5 MG TOTAL) BY MOUTH AT BEDTIME.   cyclobenzaprine 10 MG tablet Commonly known as: FLEXERIL TAKE 1 TABLET BY MOUTH 3 TIMES EVERY DAY AS NEEDED FOR MUSCLE SPASMS   fluticasone 50 MCG/ACT nasal spray Commonly known as: FLONASE Place  2 sprays into both nostrils daily as needed for allergies.   gabapentin 300 MG capsule Commonly known as: NEURONTIN TAKE 1 CAPSULE (300 MG TOTAL) BY MOUTH 3 (THREE) TIMES DAILY.   HYDROcodone-acetaminophen 10-325 MG tablet Commonly known as: Norco Take 1 tablet by mouth every 6 (six) hours as needed. What changed: reasons to take this   meloxicam 7.5 MG tablet Commonly known as: MOBIC Take 7.5 mg by mouth 2 (two) times  daily.   ondansetron 4 MG tablet Commonly known as: ZOFRAN TAKE 1 TABLET BY MOUTH EVERY 8 HOURS AS NEEDED FOR NAUSEA OR VOMITING.   pantoprazole 40 MG tablet Commonly known as: PROTONIX TAKE 1 TABLET (40 MG TOTAL) BY MOUTH 2 (TWO) TIMES DAILY.   rosuvastatin 20 MG tablet Commonly known as: CRESTOR Take 1 tablet (20 mg total) by mouth daily.   Trokendi XR 50 MG Cp24 Generic drug: Topiramate ER Take 50 mg by mouth at bedtime.   zolpidem 12.5 MG CR tablet Commonly known as: AMBIEN CR TAKE 1 TABLET (12.5 MG TOTAL) BY MOUTH AT BEDTIME.          Outstanding Labs/Studies   n/A  Duration of Discharge Encounter   Greater than 30 minutes including physician time.  SignedLeanor Kail, PA 04/11/2020, 10:30 AM  Personally seen and examined. Agree with above.   Excellent cardiac catheterization, reassurance.  Holding lisinopril given slightly increased creatinine.  We will likely be able to reinitiate as outpatient.  Candee Furbish, MD

## 2020-04-12 ENCOUNTER — Ambulatory Visit: Payer: No Typology Code available for payment source | Admitting: Internal Medicine

## 2020-04-16 ENCOUNTER — Other Ambulatory Visit: Payer: Self-pay | Admitting: Internal Medicine

## 2020-04-16 ENCOUNTER — Encounter: Payer: Self-pay | Admitting: Internal Medicine

## 2020-04-16 ENCOUNTER — Ambulatory Visit (INDEPENDENT_AMBULATORY_CARE_PROVIDER_SITE_OTHER): Payer: No Typology Code available for payment source | Admitting: Internal Medicine

## 2020-04-16 ENCOUNTER — Other Ambulatory Visit: Payer: Self-pay

## 2020-04-16 VITALS — BP 120/82 | HR 120 | Temp 96.9°F | Wt 238.0 lb

## 2020-04-16 DIAGNOSIS — E782 Mixed hyperlipidemia: Secondary | ICD-10-CM

## 2020-04-16 DIAGNOSIS — I471 Supraventricular tachycardia: Secondary | ICD-10-CM | POA: Diagnosis not present

## 2020-04-16 DIAGNOSIS — R0602 Shortness of breath: Secondary | ICD-10-CM

## 2020-04-16 DIAGNOSIS — I200 Unstable angina: Secondary | ICD-10-CM

## 2020-04-16 DIAGNOSIS — I10 Essential (primary) hypertension: Secondary | ICD-10-CM | POA: Diagnosis not present

## 2020-04-16 MED ORDER — ALBUTEROL SULFATE HFA 108 (90 BASE) MCG/ACT IN AERS: 1.0000 | INHALATION_SPRAY | RESPIRATORY_TRACT | 0 refills | Status: DC | PRN

## 2020-04-16 NOTE — Progress Notes (Signed)
Subjective:    Patient ID: [name], male    DOB: 25-May-1970, 50 y.o.   MRN: [id number]  HPI  Patient presents to the clinic today for hospital follow-up.  He initially presented to urgent care on 3/8 with 2-week history of chest tightness and shortness of breath.  He was sent to the ER for further evaluation at that time.  Chest x-ray was normal.  ECG was unchanged.  Labs were unremarkable.  He was admitted for a echo and cardiac cath which were both noncontributory.  He was diagnosed with unstable angina.  His Lisinopril was DC'd.  He was continued on his Amlodipine, Aspirin, Carvedilol and Rosuvastatin.  He was discharged on 3/10 and advised to follow-up with PCP and cardiology.  Since discharge, he reports he has some persistent shortness of breath, worse with exertion. He denies any chest pain. He does feel like his anxiety could be contributing to his anxiety. He has a follow up scheduled with cardiology.  Review of Systems      Past Medical History:  Diagnosis Date  . Anxiety    takes Xanax daily prn, panic attacks  . Chronic gastric ulcer with bleeding s/p suture repair 2012 09/29/2010   Head 3 EGD which failed to stop the bleeding. Had exploratory laparotomy on 10/04/2010. Possible cause of the gastric ulcer was NSAID use.   . Constipation    related to pain meds  . Depression   . Dizziness    occasionally  . Dyspnea    walking a bit  . Erosive esophagitis   . External hemorrhoid, bleeding   . Gastric ulcer    history of  . GERD (gastroesophageal reflux disease)    takes Protonix bid  . Hiatal hernia   . History of blood clots 2012   in abdomen  . History of blood transfusion 2012  . Incisional hernia s/p open repair w mesh Aug 2013 05/26/2011  . Joint pain    knees  . Nocturia    depends on amount of fluid he drinks  . Pneumonia 2018  . Pre-diabetes   . Primary localized osteoarthritis of left hip 04/24/2014  . Serrated polyp of colon   . Sleep apnea    no  cpap  mild no cpap needed  . Stroke ([place])   . Unstable angina ([place]) 04/09/2020  . Upper GI bleed   . Ventral hernia     Current Outpatient Medications  Medication Sig Dispense Refill  . amLODipine (NORVASC) 10 MG tablet Take 1 tablet (10 mg total) by mouth daily. 90 tablet 3  . aspirin EC 81 MG EC tablet Take 1 tablet (81 mg total) by mouth daily.    . carvedilol (COREG) 3.125 MG tablet Take 3.125 mg by mouth 2 (two) times daily with a meal.    . clonazePAM (KLONOPIN) 0.5 MG tablet TAKE 1 TABLET (0.5 MG TOTAL) BY MOUTH AT BEDTIME. 30 tablet 0  . cyclobenzaprine (FLEXERIL) 10 MG tablet TAKE 1 TABLET BY MOUTH 3 TIMES EVERY DAY AS NEEDED FOR MUSCLE SPASMS 60 tablet 0  . fluticasone (FLONASE) 50 MCG/ACT nasal spray Place 2 sprays into both nostrils daily as needed for allergies. 16 g 11  . gabapentin (NEURONTIN) 300 MG capsule TAKE 1 CAPSULE (300 MG TOTAL) BY MOUTH 3 (THREE) TIMES DAILY. 90 capsule 2  . HYDROcodone-acetaminophen (NORCO) 10-325 MG tablet Take 1 tablet by mouth every 6 (six) hours as needed. (Patient taking differently: Take 1 tablet by mouth every 6 (  six) hours as needed for moderate pain or severe pain.) 30 tablet 0  . meloxicam (MOBIC) 7.5 MG tablet Take 7.5 mg by mouth 2 (two) times daily.    . ondansetron (ZOFRAN) 4 MG tablet TAKE 1 TABLET BY MOUTH EVERY 8 HOURS AS NEEDED FOR NAUSEA OR VOMITING. 20 tablet 1  . pantoprazole (PROTONIX) 40 MG tablet TAKE 1 TABLET (40 MG TOTAL) BY MOUTH 2 (TWO) TIMES DAILY. 180 tablet 2  . rosuvastatin (CRESTOR) 20 MG tablet Take 1 tablet (20 mg total) by mouth daily. 90 tablet 3  . Topiramate ER (TROKENDI XR) 50 MG CP24 Take 50 mg by mouth at bedtime. 30 capsule 5  . zolpidem (AMBIEN CR) 12.5 MG CR tablet TAKE 1 TABLET (12.5 MG TOTAL) BY MOUTH AT BEDTIME. 30 tablet 0   No current facility-administered medications for this visit.    Allergies  Allergen Reactions  . Dilaudid [Hydromorphone Hcl] Rash and Other (See Comments)    Shakey  .  Hydromorphone Hcl Other (See Comments) and Rash    Shakey  . Metoclopramide Other (See Comments), Rash, Swelling and Hives    Swelling in lips  . Metoclopramide Hcl Hives, Swelling, Rash and Other (See Comments)    Swelling in lips  . Nsaids     H/o bleeding gastric ulcers  . Benadryl [Diphenhydramine]     Causes patient to become hyper and pace    Family History  Problem Relation Age of Onset  . Diabetes Mother   . Liver disease Mother   . Diabetes Father   . Heart disease Father   . Healthy Sister   . Healthy Brother   . Healthy Brother   . Colon cancer Neg Hx   . Stomach cancer Neg Hx   . Rectal cancer Neg Hx   . Esophageal cancer Neg Hx   . Colon polyps Neg Hx     Social History   Socioeconomic History  . Marital status: Single    Spouse name: Not on file  . Number of children: 1  . Years of education: 33  . Highest education level: High school graduate  Occupational History  . Occupation: Maintenance - [employer]  Tobacco Use  . Smoking status: Never Smoker  . Smokeless tobacco: Current User    Types: Chew  . Tobacco comment: occ-Chew  Vaping Use  . Vaping Use: Never used  Substance and Sexual Activity  . Alcohol use: Yes    Alcohol/week: 0.0 standard drinks    Comment: 09/04/11 "very seldom"  . Drug use: No  . Sexual activity: Yes  Other Topics Concern  . Not on file  Social History Narrative   Lives with brother in a one story home.  Has one daughter.  Works in Theatre manager for [employer].  Education: high school.       Patient is left-handed.   Social Determinants of Health   Financial Resource Strain: Not on file  Food Insecurity: Not on file  Transportation Needs: Not on file  Physical Activity: Not on file  Stress: Not on file  Social Connections: Not on file  Intimate Partner Violence: Not on file     Constitutional: Denies fever, malaise, fatigue, headache or abrupt weight changes.  HEENT: Denies eye pain, eye redness, ear pain,  ringing in the ears, wax buildup, runny nose, nasal congestion, bloody nose, or sore throat. Respiratory: Pt reports shortness of breath. Denies difficulty breathing, cough or sputum prooduction.   Cardiovascular: Denies chest pain, chest tightness,  palpitations or swelling in the hands or feet.  Neurological: Denies dizziness, difficulty with memory, difficulty with speech or problems with balance and coordination.  Psych: Pt has a history of anxiety. Denies depression, SI/HI.  No other specific complaints in a complete review of systems (except as listed in HPI above).  Objective:   Physical Exam  BP 120/82   Pulse (!) 120   Temp (!) 96.9 F (36.1 C) (Temporal)   Wt 238 lb (108 kg)   SpO2 98%   BMI 35.15 kg/m   Wt Readings from Last 3 Encounters:  04/11/20 231 lb 12.8 oz (105.1 kg)  03/13/20 242 lb (109.8 kg)  01/03/20 244 lb 9.6 oz (110.9 kg)    General: Appears his stated age, obese, in NAD. HEENT: Head: normal shape and size; Eyes: sclera white, no icterus, conjunctiva pink, PERRLA and EOMs intact; Cardiovascular: Tachycardic with normal rhythm. S1,S2 noted.  No murmur, rubs or gallops noted.  Pulmonary/Chest: Normal effort and positive vesicular breath sounds. No respiratory distress. No wheezes, rales or ronchi noted.  Neurological: Alert and oriented.  Psychiatric: Mildly anxious appearing. Behavior is normal. Judgment and thought content normal.     BMET    Component Value Date/Time   NA 137 04/11/2020 0320   NA 141 12/12/2019 1134   K 3.7 04/11/2020 0320   CL 106 04/11/2020 0320   CO2 25 04/11/2020 0320   GLUCOSE 110 (H) 04/11/2020 0320   BUN 16 04/11/2020 0320   BUN 11 12/12/2019 1134   CREATININE 1.28 (H) 04/11/2020 0320   CALCIUM 8.6 (L) 04/11/2020 0320   GFRNONAA >60 04/11/2020 0320   GFRAA 96 12/12/2019 1134    Lipid Panel     Component Value Date/Time   CHOL 184 04/10/2020 0402   TRIG 159 (H) 04/10/2020 0402   HDL 54 04/10/2020 0402   CHOLHDL  3.4 04/10/2020 0402   VLDL 32 04/10/2020 0402   LDLCALC 98 04/10/2020 0402    CBC    Component Value Date/Time   WBC 8.2 04/11/2020 0320   RBC 4.43 04/11/2020 0320   HGB 13.8 04/11/2020 0320   HCT 40.5 04/11/2020 0320   PLT 256 04/11/2020 0320   MCV 91.4 04/11/2020 0320   MCH 31.2 04/11/2020 0320   MCHC 34.1 04/11/2020 0320   RDW 13.2 04/11/2020 0320   LYMPHSABS 2.4 04/09/2020 1553   MONOABS 1.0 04/09/2020 1553   EOSABS 0.2 04/09/2020 1553   BASOSABS 0.1 04/09/2020 1553    Hgb A1C Lab Results  Component Value Date   HGBA1C 6.2 08/02/2019           Assessment & Plan:   Hospital Follow Up for Unstable Angina, HTN, HLD, PSVT:  Hospital notes, labs and imaging reviewed Meds reviewed- no changes at this time His HR remains elevated, could increase Carvedilol to 6.25 mg BID but will defer this to cardiology He will follow up with cardiology as an outpatient Rx for Albuterol 1-2 puffs Q4H prn for SOB  Return precautions discussed Webb Silversmith, NP This visit occurred during the SARS-CoV-2 public health emergency.  Safety protocols were in place, including screening questions prior to the visit, additional usage of staff PPE, and extensive cleaning of exam room while observing appropriate contact time as indicated for disinfecting solutions.

## 2020-04-16 NOTE — Patient Instructions (Signed)
Angina  Angina is discomfort or pain in the chest, neck, arm, jaw, or back. The discomfort is caused by a lack of blood in the middle layer of the heart wall. The middle layer of the heart wall is called the myocardium. What are the causes? This condition is caused by a buildup of fat and cholesterol, or plaque, in your arteries. This buildup narrows the arteries and makes it hard for blood to flow. What increases the risk? Main risks  High levels of cholesterol in your blood.  High blood pressure.  Diabetes.  Family history of heart disease.  Not exercising or moving enough.  Depression.  Having had radiation treatment on the left side of your chest. Other risks  Tobacco use.  Too much body weight (obesity).  A diet that is high in unhealthy fats (saturated fats).  Stress.  Using drugs, such as cocaine. Risks for women  Being older than 55 years.  Being in menopause. This is the time when a woman no longer has a menstrual period. What are the signs or symptoms? Symptoms in all people  Chest pain, which may: ? Feel like a crushing or squeezing in the chest. ? Feel like a tightness, pressure, fullness, or heaviness in the chest. ? Last for more than a few minutes at a time. ? Stop and come back (recur) after a few minutes.  Pain in the neck, arm, jaw, or back.  Heartburn or upset stomach (indigestion) for no reason.  Being short of breath.  Feeling like you may vomit (nauseous).  Sudden cold sweats. Symptoms in women and people with diabetes  Tiredness.  Worry and anxiety.  Weakness.  Dizziness or fainting. How is this treated? This condition may be treated with:  Medicines. These can be given to prevent blood clots, stop a heart attack, lower blood pressure, or treat other risk factors.  A procedure to widen a narrowed or blocked artery in the heart.  Surgery to allow blood to go around a blocked artery. Follow these instructions at  home: Medicines  Take over-the-counter and prescription medicines only as told by your doctor.  Do not take these medicines unless your doctor says that you can: ? NSAIDs. These include:  Ibuprofen.  Naproxen. ? Vitamin supplements that have vitamin A, vitamin E, or both. ? Hormone therapy that contains estrogen with or without progestin. Eating and drinking  Eat a heart-healthy diet that includes: ? Lots of fresh fruits and vegetables. ? Whole grains. ? Low-fat (lean) protein. ? Low-fat dairy products.  Follow instructions from your doctor about what you cannot eat or drink.   Activity  Follow an exercise program that your doctor tells you.  Talk with your doctor about joining a program to make your heart strong again (cardiac rehab).  When you feel tired, take a break. Plan breaks if you know you are going to feel tired. Lifestyle  Do not smoke or use any products that contain nicotine or tobacco. If you need help quitting, ask your doctor.  If your doctor says you can drink alcohol: ? Limit how much you have to:  0-1 drink a day for women who are not pregnant.  0-2 drinks a day for men. ? Know how much alcohol is in your drink. In the [place] one drink equals one 12 oz bottle of beer (355 mL), one 5 oz glass of wine (148 mL), or one 1 oz glass of hard liquor (44 mL).   General instructions  Stay at  a healthy weight. If told to lose weight, work with your doctor to do lose weight safely.  Learn to manage stress. If you need help, ask your doctor.  Keep your vaccines up to date. Get a flu shot every year.  Talk with your doctor if you feel sad. Take a screening test to see if you are at risk for depression.  Work with your doctor to manage any other health problems that you have. These may include diabetes or high blood pressure.  Keep all follow-up visits. Get help right away if:  You have pain in your chest, neck, arm, jaw, or back, and the pain: ? Lasts more  than a few minutes. ? Comes back. ? Does not get better after you take medicine under your tongue (sublingual nitroglycerin). ? Keeps getting worse. ? Comes more often.  You have any of these problems: ? Sweating a lot. ? Heartburn or upset stomach. ? Shortness of breath. ? Trouble breathing. ? Feeling like you may vomit. ? Vomiting. ? Feeling more tired than normal. ? Feeling nervous or worrying more than normal. ? Weakness.  You are dizzy or light-headed all of a sudden.  You faint. These symptoms may be an emergency. Get help right away. Call your local emergency services (911 in the [place]  Do not wait to see if the symptoms will go away.  Do not drive yourself to the hospital. Summary  Angina is discomfort or pain in the chest, neck, arm, neck, or back.  Symptoms include chest pain, heartburn or upset stomach, and shortness of breath.  Women or people with diabetes may have other symptoms, such as feeling nervous, being worried, or being weak or tired.  Take all medicines only as told by your doctor.  You should eat a heart-healthy diet and follow an exercise program. This information is not intended to replace advice given to you by your health care provider. Make sure you discuss any questions you have with your health care provider. Document Revised: 07/14/2019 Document Reviewed: 07/14/2019 Elsevier Patient Education  2021 [employer].

## 2020-04-24 NOTE — Progress Notes (Unsigned)
Cardiology Office Note:    Date:  04/26/2020   ID:  [name], DOB [birth year], MRN [id number]  PCP:  [name], NP   [place]  Cardiologist:  Freada Bergeron, MD  Advanced Practice Provider:  No care team member to display Electrophysiologist:  None    Referring MD: [name], NP    History of Present Illness:    [name] is a 50 y.o. male with a hx of non-obstructive CAD by CT 2021, UGI bleed 2012, GERD, anxiety, depression, brief SVT by monitor, and FHx CAD who was recently admitted for chest pain with no significant coronary artery disease now presenting to clinic for follow-up.  Patient initially saw me in clinic on 12/12/19 where he was having DOE. Coronary CTA showed nonobstructive disease and mildly dilated aorta (4.1cm).We checked d-dimer which was normal. Cardiac monitor was placed for palpitation which showed average HR 103bpm and 3 episodes of SVT but no Afib, VT or significant pauses.  He followed up on 03/13/20 with persistent symptoms. TTE was obtained on 04/02/20 with LVEF 55-60%, normal diastology, no significant valve disease.   He then presented to [facility] on 04/09/20 with chest pain. Trop was negative. Given concern for unstable angina, he underwent coronary angiography which was negative for obstructive disease and his chest pain was deemed non-cardiac in nature. He was discharged home on 04/11/20.  Today, the patient states that he continues to feel short of breath and occasional chest discomfort but he is relieved to know that he does not have heart disease. Has chronic RLE edema after breaking the leg but not changes. Has been exercising and eating better to lose weight. Lost 8lbs so far. No orthopnea, PND, dizziness or syncope.   Past Medical History:  Diagnosis Date  . Anxiety    takes Xanax daily prn, panic attacks  . Chronic gastric ulcer with bleeding s/p suture repair 2012 09/29/2010   Head 3 EGD which  failed to stop the bleeding. Had exploratory laparotomy on 10/04/2010. Possible cause of the gastric ulcer was NSAID use.   . Constipation    related to pain meds  . Depression   . Dizziness    occasionally  . Dyspnea    walking a bit  . Erosive esophagitis   . External hemorrhoid, bleeding   . Gastric ulcer    history of  . GERD (gastroesophageal reflux disease)    takes Protonix bid  . Hiatal hernia   . History of blood clots 2012   in abdomen  . History of blood transfusion 2012  . Incisional hernia s/p open repair w mesh Aug 2013 05/26/2011  . Joint pain    knees  . Nocturia    depends on amount of fluid he drinks  . Pneumonia 2018  . Pre-diabetes   . Primary localized osteoarthritis of left hip 04/24/2014  . Serrated polyp of colon   . Sleep apnea    no cpap  mild no cpap needed  . Stroke ([place])   . Unstable angina ([place]) 04/09/2020  . Upper GI bleed   . Ventral hernia     Past Surgical History:  Procedure Laterality Date  . ANTERIOR CERVICAL DECOMPRESSION/DISCECTOMY FUSION 4 LEVELS N/A 04/08/2018   Procedure: ACDF - C3-C4 - C4-C5 - C5-C6 - C6-C7;  Surgeon: Kary Kos, MD;  Location: [place];  Service: Neurosurgery;  Laterality: N/A;  . CARPAL TUNNEL RELEASE Left 04/03/2018  . COLONOSCOPY    .  ESOPHAGEAL MANOMETRY N/A 10/19/2016   Procedure: ESOPHAGEAL MANOMETRY (EM);  Surgeon: Mauri Pole, MD;  Location: [facility];  Service: Endoscopy;  Laterality: N/A;  . ESOPHAGOGASTRODUODENOSCOPY    . ESOPHAGOGASTRODUODENOSCOPY N/A 01/22/2017   Procedure: ESOPHAGOGASTRODUODENOSCOPY (EGD);  Surgeon: [name], MD;  Location: [facility];  Service: General;  Laterality: N/A;  . INCISIONAL HERNIA REPAIR  09/04/2011   Procedure: HERNIA REPAIR INCISIONAL;  Surgeon: Madilyn Hook, DO;  Location: [place];  Service: General;  Laterality: N/A;  debridment calcified mass  . INSERTION OF MESH  09/04/2011   retrorectus ultrapro "30x30cm"  . INSERTION OF MESH N/A 01/22/2017   Procedure:  INSERTION OF MESH;  Surgeon: [name], MD;  Location: [facility];  Service: General;  Laterality: N/A;  . LEFT HEART CATH AND CORONARY ANGIOGRAPHY N/A 04/11/2020   Procedure: LEFT HEART CATH AND CORONARY ANGIOGRAPHY;  Surgeon: Lorretta Harp, MD;  Location: [facility];  Service: Cardiovascular;  Laterality: N/A;  . NISSEN FUNDOPLICATION     Open [id number]  . POLYPECTOMY    . STOMACH SURGERY  10/05/2010   Oversewing of gastric ulcer.  Dr Brantley Stage  . TOTAL HIP ARTHROPLASTY Left 04/24/2014   Procedure: LEFT TOTAL HIP ARTHROPLASTY;  Surgeon: Marchia Bond, MD;  Location: [employer] [facility];  Service: Orthopedics;  Laterality: Left;  . UPPER GASTROINTESTINAL ENDOSCOPY      Current Medications: Current Meds  Medication Sig  . albuterol (VENTOLIN HFA) 108 (90 Base) MCG/ACT inhaler Inhale 1-2 puffs into the lungs every 4 (four) hours as needed for wheezing or shortness of breath.  Marland Kitchen amLODipine (NORVASC) 10 MG tablet Take 1 tablet (10 mg total) by mouth daily.  Marland Kitchen aspirin EC 81 MG EC tablet Take 1 tablet (81 mg total) by mouth daily.  . carvedilol (COREG) 3.125 MG tablet Take 3.125 mg by mouth 2 (two) times daily with a meal.  . clonazePAM (KLONOPIN) 0.5 MG tablet TAKE 1 TABLET (0.5 MG TOTAL) BY MOUTH AT BEDTIME.  . cyclobenzaprine (FLEXERIL) 10 MG tablet TAKE 1 TABLET BY MOUTH 3 TIMES EVERY DAY AS NEEDED FOR MUSCLE SPASMS  . fluticasone (FLONASE) 50 MCG/ACT nasal spray Place 2 sprays into both nostrils daily as needed for allergies.  Marland Kitchen gabapentin (NEURONTIN) 300 MG capsule TAKE 1 CAPSULE (300 MG TOTAL) BY MOUTH 3 (THREE) TIMES DAILY.  Marland Kitchen HYDROcodone-acetaminophen (NORCO) 10-325 MG tablet Take 1 tablet by mouth every 6 (six) hours as needed.  . meloxicam (MOBIC) 7.5 MG tablet Take 7.5 mg by mouth 2 (two) times daily.  . ondansetron (ZOFRAN) 4 MG tablet TAKE 1 TABLET BY MOUTH EVERY 8 HOURS AS NEEDED FOR NAUSEA OR VOMITING.  . pantoprazole (PROTONIX) 40 MG tablet TAKE 1 TABLET (40 MG TOTAL) BY MOUTH 2 (TWO)  TIMES DAILY.  . rosuvastatin (CRESTOR) 20 MG tablet Take 1 tablet (20 mg total) by mouth daily.  . Topiramate ER (TROKENDI XR) 50 MG CP24 Take 50 mg by mouth at bedtime.  Marland Kitchen zolpidem (AMBIEN CR) 12.5 MG CR tablet TAKE 1 TABLET (12.5 MG TOTAL) BY MOUTH AT BEDTIME.     Allergies:   Dilaudid [hydromorphone hcl], Hydromorphone hcl, Metoclopramide, Metoclopramide hcl, Nsaids, and Benadryl [diphenhydramine]   Social History   Socioeconomic History  . Marital status: Single    Spouse name: Not on file  . Number of children: 1  . Years of education: 40  . Highest education level: High school graduate  Occupational History  . Occupation: Maintenance - [employer]  Tobacco Use  . Smoking  status: Never Smoker  . Smokeless tobacco: Current User    Types: Chew  . Tobacco comment: occ-Chew  Vaping Use  . Vaping Use: Never used  Substance and Sexual Activity  . Alcohol use: Yes    Alcohol/week: 0.0 standard drinks    Comment: 09/04/11 "very seldom"  . Drug use: No  . Sexual activity: Yes  Other Topics Concern  . Not on file  Social History Narrative   Lives with brother in a one story home.  Has one daughter.  Works in Theatre manager for [employer].  Education: high school.       Patient is left-handed.   Social Determinants of Health   Financial Resource Strain: Not on file  Food Insecurity: Not on file  Transportation Needs: Not on file  Physical Activity: Not on file  Stress: Not on file  Social Connections: Not on file     Family History: The patient's family history includes Diabetes in his father and mother; Healthy in his brother, brother, and sister; Heart disease in his father; Liver disease in his mother. There is no history of Colon cancer, Stomach cancer, Rectal cancer, Esophageal cancer, or Colon polyps.  ROS:   Please see the history of present illness.    Review of Systems  Constitutional: Positive for weight loss. Negative for chills and fever.  HENT: Negative for  hearing loss and sore throat.   Eyes: Negative for blurred vision and redness.  Respiratory: Positive for shortness of breath.   Cardiovascular: Positive for chest pain. Negative for palpitations, orthopnea, claudication, leg swelling and PND.  Gastrointestinal: Negative for melena, nausea and vomiting.  Genitourinary: Negative for dysuria and flank pain.  Musculoskeletal: Negative for falls and myalgias.  Neurological: Negative for dizziness and loss of consciousness.  Endo/Heme/Allergies: Negative for polydipsia.  Psychiatric/Behavioral: Negative for substance abuse.    EKGs/Labs/Other Studies Reviewed:    The following studies were reviewed today: Cath 04/11/20: History obtained from chart review.50 y.o.malewith nonobstructive CAD by CT 2021, UGIB 2012, prior CVA 2019, mild carotid disease 1-39% 2019, GERD, HTN, anxiety, depression, brief SVT by monitor, dilation of aortic root, and family history of CAD. Presented to [facility] with recent DOE with worsening exertional CP as well.  His enzymes were negative.  Because the nature of his symptoms and risk factors decision was made to refer to cardiac catheterization to define his anatomy and physiology.  IMPRESSION: Mr. Bohman has normal coronary arteries and normal filling pressures.  I believe his chest pain is noncardiac.  His 2D echo revealed normal LV systolic function.  The sheath was removed and a TR band was placed on the right wrist to achieve patent hemostasis.  The patient left lab in stable condition.  He will most likely be discharged home later today and will follow up as an outpatient.  TTE 04/02/20: 1. Left ventricular ejection fraction, by estimation, is 55 to 60%. The  left ventricle has normal function. The left ventricle has no regional  wall motion abnormalities. Left ventricular diastolic parameters were  normal. The average left ventricular  global longitudinal strain is -25.2 %. The global longitudinal strain is  normal.   2. Right ventricular systolic function is normal. The right ventricular  size is normal.  3. The mitral valve is normal in structure. No evidence of mitral valve  regurgitation. No evidence of mitral stenosis.  4. The aortic valve is tricuspid. Aortic valve regurgitation is not  visualized. No aortic stenosis is present.  5. Aortic dilatation  noted. There is mild dilatation of the aortic root,  measuring 41 mm.  6. The inferior vena cava is normal in size with greater than 50%  respiratory variability, suggesting right atrial pressure of 3 mmHg.   Comparison(s): 10/06/17 EF 55-60%.   Coronary CTA 11/20/19: FINDINGS: Quality: Good, HR 73  Coronary calcium score: The patient's coronary artery calcium score is 41, which places the patient in the 83rd percentile.  Coronary arteries: Normal coronary origins.  Right dominance.  Right Coronary Artery: Dominant.  Normal.  Left Main Coronary Artery: Normal. Bifurcates into the LAD and LCx arteries.  Left Anterior Descending Coronary Artery: Extends around the apex. Minimal mixed ostial 1-24% stenosis (CADRADS1). Mild eccentric non-calcified mid-LAD stenosis 24-49% (CADRADS2). Moderate size D1 vessel without disease. Smaller D2 vessel without disease.  Left Circumflex Artery: Large AV groove vessel that coarses down the lateral wall. Normal. Several small OM branches without disease.  Aorta: Dilated aortic root measuring 4.1 cm at the sinus of valsalva. No calcifications. No dissection.  Aortic Valve: Trileaflet.  No calcifications.  Other findings:  Normal pulmonary vein drainage into the left atrium.  Normal left atrial appendage without a thrombus.  Normal size of the pulmonary artery.  IMPRESSION: 1. Mild mixed non-obstructive CAD, CADRADS = 2.  2. Coronary calcium score of 41. This was 83rd percentile for age and sex matched control. Calcium noted in the LAD.  3. Normal coronary origin with right  dominance.  4. Dilated aortic root measuring 4.1 cm at the sinus of Valsalva.  5. Aggressive cardiovascular risk factor modification is Recommended.  Cardiac 01/24/20:  The patient's cardiac monitor enrollement period was for 6 days and 23 hours  The predominant rhythm was sinus rhythm with average HR 103bpm. HR ranged from 78bpm to 193bpm  There were 3 episodes of SVT with longest lasting 9 beats at HR 142bpm  There were rare PVCs and PACs  Most patient triggered events were associated with sinus tachycardia and occasionally with PACs or PVCs.  No Afib, VT or significant pauses    EKG:  EKG is  ordered today.  The ekg ordered today demonstrates NSR with HR 82  Recent Labs: 12/12/2019: TSH 1.980 04/09/2020: ALT 26 04/11/2020: BUN 16; Creatinine, Ser 1.28; Hemoglobin 13.8; Platelets 256; Potassium 3.7; Sodium 137  Recent Lipid Panel    Component Value Date/Time   CHOL 184 04/10/2020 0402   TRIG 159 (H) 04/10/2020 0402   HDL 54 04/10/2020 0402   CHOLHDL 3.4 04/10/2020 0402   VLDL 32 04/10/2020 0402   LDLCALC 98 04/10/2020 0402     Physical Exam:    VS:  BP 120/80 (BP Location: Left Arm, Patient Position: Sitting, Cuff Size: Normal)   Pulse 82   Ht 5\' 9"  (1.753 m)   Wt 236 lb (107 kg)   BMI 34.85 kg/m     Wt Readings from Last 3 Encounters:  04/26/20 236 lb (107 kg)  04/16/20 238 lb (108 kg)  04/11/20 231 lb 12.8 oz (105.1 kg)     GEN:  Well nourished, well developed in no acute distress HEENT: Normal NECK: No JVD; No carotid bruits CARDIAC: RRR, no murmurs, rubs, gallops RESPIRATORY:  Clear to auscultation without rales, wheezing or rhonchi  ABDOMEN: Soft, non-tender, non-distended MUSCULOSKELETAL:  No edema; No deformity  SKIN: Warm and dry NEUROLOGIC:  Alert and oriented x 3 PSYCHIATRIC:  Normal affect   ASSESSMENT:    1. Dyspnea on exertion   2. Cerebrovascular accident (CVA), unspecified mechanism ([place])  3. Essential hypertension   4. Ascending  aorta dilatation (HCC)   5. Palpitations   6. Precordial pain   7. Coronary artery disease involving native coronary artery of native heart without angina pectoris   8. Obesity (BMI 35.0-39.9 without comorbidity)    PLAN:    In order of problems listed above:  #Chest Pain: #Dyspnea on Exertion: Recent admission to [facility] hospital from 04/09/20-04/11/20 for chest pain and continued SOB on exertion. Underwent cath which was negative for obstructive disease. TTE with normal BiV function, no significant valve disease. Suspect symptoms are non-cardiac in nature. -Cath negative for obstructive disease -TTE with normal biventricular function and no valvular disease -D-dimer negative -Aggressive blood pressure control  -Refer to pulm -Discussed diet and weight loss at length with him today as weight gain may be the primary driver of his symptoms--patient is motivated to eat healthy and increase his exercise  #Palpitations: Zio with brief episodes of SVT but otherwise normal. Triggered events mainly correlated with PVCs. TSH normal. -No significant arrhythmias on the monitor -Continue coreg 3.125mg  BID  #Non-obstructive CAD: Calcium score 41 with nonobstructive, mild CAD on CTA. Cath on 04/11/20 with no obstructive disease.  -Continue rosuvastatin 20mg  daily, ASA 81mg  daily -Continue diet and exercise modifications  #Hypertension:. Well controlled -Continueamlodipine10mg  daily -Continue coreg 3.125mg  BID -Continue lisinopril 20mg  daily  #History of CVA: Had CVA with right sided weakness and facial numbness in 2019. No residual deficits. -Bilateral carotid ultrasound with minimal plaque -Continue ASA 81mg  daily -Continuecrestor 20mg  daily  #Ascending aorta dilation 81mm: -Routine surveillance yearly  #Hyperlipdiemia: LDL 98, HDL 54.  -ContinueCrestor 20mg  daily -Repeat lipids at next visit after lifestyle modifications and if LDL>70, will need to add zetia  #Obesity with  BMI 35: -Discussed diet and weight loss at length today as detailed below -Will check into ozempic coverage  Exercise recommendations: Goal of exercising for at least 30 minutes a day, at least 5 times per week.  Please exercise to a moderate exertion.  This means that while exercising it is difficult to speak in full sentences, however you are not so short of breath that you feel you must stop, and not so comfortable that you can carry on a full conversation.  Exertion level should be approximately a 5/10, if 10 is the most exertion you can perform.  Diet recommendations: Recommend a heart healthy diet such as the Mediterranean diet.  This diet consists of plant based foods, healthy fats, lean meats, olive oil.  It suggests limiting the intake of simple carbohydrates such as white breads, pastries, and pastas.  It also limits the amount of red meat, wine, and dairy products such as cheese that one should consume on a daily basis.   Medication Adjustments/Labs and Tests Ordered: Current medicines are reviewed at length with the patient today.  Concerns regarding medicines are outlined above.  Orders Placed This Encounter  Procedures  . Basic metabolic panel  . Ambulatory referral to Pulmonology  . EKG 12-Lead   No orders of the defined types were placed in this encounter.   Patient Instructions  Medication Instructions:   *If you need a refill on your cardiac medications before your next appointment, please call your pharmacy*   Lab Work: [facility]  If you have labs (blood work) drawn today and your tests are completely normal, you will receive your results only by: Marland Kitchen MyChart Message (if you have MyChart) OR . A paper copy in the mail If you have any lab test  that is abnormal or we need to change your treatment, we will call you to review the results.   Testing/Procedures: none   Follow-Up: At [facility], you and your health needs are our priority.  As part of our  continuing mission to provide you with exceptional heart care, we have created designated Provider Care Teams.  These Care Teams include your primary Cardiologist (physician) and Advanced Practice Providers (APPs -  Physician Assistants and Nurse Practitioners) who all work together to provide you with the care you need, when you need it.  We recommend signing up for the patient portal called "MyChart".  Sign up information is provided on this After Visit Summary.  MyChart is used to connect with patients for Virtual Visits (Telemedicine).  Patients are able to view lab/test results, encounter notes, upcoming appointments, etc.  Non-urgent messages can be sent to your provider as well.   To learn more about what you can do with MyChart, go to [email or web address].    Your next appointment:   6 month(s)  The format for your next appointment:   In Person  Provider:   You will see one of the following Advanced Practice Providers on your designated Care Team:    Richardson Dopp, PA-C  Robbie Lis, [place]     Other Instructions Referral for Pulmonary for Dyspnea     Signed, Freada Bergeron, MD  04/26/2020 8:48 AM    [place]

## 2020-04-26 ENCOUNTER — Encounter: Payer: Self-pay | Admitting: Cardiology

## 2020-04-26 ENCOUNTER — Telehealth: Payer: Self-pay | Admitting: Pharmacist

## 2020-04-26 ENCOUNTER — Other Ambulatory Visit: Payer: Self-pay

## 2020-04-26 ENCOUNTER — Ambulatory Visit: Payer: No Typology Code available for payment source | Admitting: Cardiology

## 2020-04-26 VITALS — BP 120/80 | HR 82 | Ht 69.0 in | Wt 236.0 lb

## 2020-04-26 DIAGNOSIS — I10 Essential (primary) hypertension: Secondary | ICD-10-CM | POA: Diagnosis not present

## 2020-04-26 DIAGNOSIS — R002 Palpitations: Secondary | ICD-10-CM

## 2020-04-26 DIAGNOSIS — I2510 Atherosclerotic heart disease of native coronary artery without angina pectoris: Secondary | ICD-10-CM

## 2020-04-26 DIAGNOSIS — R0600 Dyspnea, unspecified: Secondary | ICD-10-CM | POA: Diagnosis not present

## 2020-04-26 DIAGNOSIS — E669 Obesity, unspecified: Secondary | ICD-10-CM

## 2020-04-26 DIAGNOSIS — R072 Precordial pain: Secondary | ICD-10-CM

## 2020-04-26 DIAGNOSIS — I77810 Thoracic aortic ectasia: Secondary | ICD-10-CM | POA: Diagnosis not present

## 2020-04-26 DIAGNOSIS — R0609 Other forms of dyspnea: Secondary | ICD-10-CM

## 2020-04-26 DIAGNOSIS — I639 Cerebral infarction, unspecified: Secondary | ICD-10-CM | POA: Diagnosis not present

## 2020-04-26 LAB — BASIC METABOLIC PANEL
BUN/Creatinine Ratio: 16 (ref 9–20)
BUN: 18 mg/dL (ref 6–24)
CO2: 20 mmol/L (ref 20–29)
Calcium: 9.7 mg/dL (ref 8.7–10.2)
Chloride: 106 mmol/L (ref 96–106)
Creatinine, Ser: 1.16 mg/dL (ref 0.76–1.27)
Glucose: 129 mg/dL — ABNORMAL HIGH (ref 65–99)
Potassium: 4.6 mmol/L (ref 3.5–5.2)
Sodium: 142 mmol/L (ref 134–144)
eGFR: 77 mL/min/{1.73_m2} (ref >59)

## 2020-04-26 NOTE — Patient Instructions (Signed)
Medication Instructions:   *If you need a refill on your cardiac medications before your next appointment, please call your pharmacy*   Lab Work: Bmet  If you have labs (blood work) drawn today and your tests are completely normal, you will receive your results only by: Marland Kitchen MyChart Message (if you have MyChart) OR . A paper copy in the mail If you have any lab test that is abnormal or we need to change your treatment, we will call you to review the results.   Testing/Procedures: none   Follow-Up: At [facility], you and your health needs are our priority.  As part of our continuing mission to provide you with exceptional heart care, we have created designated Provider Care Teams.  These Care Teams include your primary Cardiologist (physician) and Advanced Practice Providers (APPs -  Physician Assistants and Nurse Practitioners) who all work together to provide you with the care you need, when you need it.  We recommend signing up for the patient portal called "MyChart".  Sign up information is provided on this After Visit Summary.  MyChart is used to connect with patients for Virtual Visits (Telemedicine).  Patients are able to view lab/test results, encounter notes, upcoming appointments, etc.  Non-urgent messages can be sent to your provider as well.   To learn more about what you can do with MyChart, go to [email or web address].    Your next appointment:   6 month(s)  The format for your next appointment:   In Person  Provider:   You will see one of the following Advanced Practice Providers on your designated Care Team:    Richardson Dopp, PA-C  Robbie Lis, [place]     Other Instructions Referral for Pulmonary for Dyspnea

## 2020-04-26 NOTE — Telephone Encounter (Signed)
Consulted by Dr Johney Frame regarding GLP1-RA therapy for weight loss. Prior authorization for [facility] has been submitted.

## 2020-04-30 ENCOUNTER — Other Ambulatory Visit: Payer: Self-pay | Admitting: Internal Medicine

## 2020-04-30 ENCOUNTER — Other Ambulatory Visit: Payer: Self-pay | Admitting: Cardiology

## 2020-04-30 MED ORDER — SEMAGLUTIDE-WEIGHT MANAGEMENT 1.7 MG/0.75ML ~~LOC~~ SOAJ: 1.7000 mg | SUBCUTANEOUS | 0 refills | Status: DC

## 2020-04-30 MED ORDER — SEMAGLUTIDE-WEIGHT MANAGEMENT 2.4 MG/0.75ML ~~LOC~~ SOAJ: 2.4000 mg | SUBCUTANEOUS | 5 refills | Status: DC

## 2020-04-30 MED ORDER — SEMAGLUTIDE-WEIGHT MANAGEMENT 1 MG/0.5ML ~~LOC~~ SOAJ: 1.0000 mg | SUBCUTANEOUS | 0 refills | Status: DC

## 2020-04-30 MED ORDER — SEMAGLUTIDE-WEIGHT MANAGEMENT 0.25 MG/0.5ML ~~LOC~~ SOAJ: 0.2500 mg | SUBCUTANEOUS | 0 refills | Status: DC

## 2020-04-30 MED ORDER — SEMAGLUTIDE-WEIGHT MANAGEMENT 0.5 MG/0.5ML ~~LOC~~ SOAJ: 0.5000 mg | SUBCUTANEOUS | 0 refills | Status: DC

## 2020-04-30 NOTE — Telephone Encounter (Signed)
[facility] prior authorization approved through 10/30/20. Spoke with pt who is interested in starting therapy. No family history of thyroid cancer, no personal history of pancreatitis. Rx sent to pharmacy to determine copay- came back at $25. Scheduled pt in office tomorrow for [facility] initiation and education.

## 2020-04-30 NOTE — Telephone Encounter (Signed)
Too soon

## 2020-04-30 NOTE — Progress Notes (Signed)
Patient ID: [name]                 DOB: Aug 06, 1970                    MRN: [id number]     HPI: [name] is a 50 y.o. male patient referred to pharmacy clinic by Dr. Johney Frame to initiate weight loss therapy with GLP1-RA. PMH is significant for obesity complicated by chronic medical conditions including non-obstructive CAD by CT 2021, stroke, pre-diabetes, GERD, anxiety, depression, brief SVT by monitor, and FHx CAD.   At last visit with Dr. Johney Frame, reported working on exercising and eating better to lose weight. Lost 8lbs so far.   Today, patient presents for first Wegovy injection. Prior authorization has been approved through 10/30/20, copay is $25. Reports he is working on increasing his exercise. He is staying away from fast food and cooking at home more. He says his biggest problem is he wakes up in the middle of the night to use the restroom and will go eat whatever is in the fridge/pantry. Says this is more of a habit than him being hungry.   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, 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  Family History: Diabetes in his father and mother; Healthy in his brother, brother, and sister; Heart disease in his father; Liver disease in his mother  Social History: Never smoker  Labs: Lab Results  Component Value Date   HGBA1C 6.2 08/02/2019    Wt Readings from Last 1 Encounters:  04/26/20 236 lb (107 kg)     BP Readings from Last 1 Encounters:  04/26/20 120/80   Pulse Readings from Last 1 Encounters:  04/26/20 82       Component Value Date/Time   CHOL 184 04/10/2020 0402   TRIG 159 (H) 04/10/2020 0402   HDL 54 04/10/2020 0402   CHOLHDL 3.4 04/10/2020 0402   VLDL 32 04/10/2020 0402   LDLCALC 98 04/10/2020 0402    Past Medical History:  Diagnosis Date  . Anxiety    takes Xanax daily prn, panic attacks  . Chronic gastric ulcer with bleeding s/p suture repair 2012 09/29/2010   Head 3 EGD which failed to stop the bleeding. Had exploratory laparotomy on 10/04/2010. Possible cause of the gastric ulcer was NSAID use.   . Constipation    related to pain meds  . Depression   . Dizziness    occasionally  . Dyspnea    walking a bit  . Erosive esophagitis   . External hemorrhoid, bleeding   . Gastric ulcer    history of  . GERD (gastroesophageal reflux disease)    takes Protonix bid  . Hiatal hernia   . History of blood clots 2012   in abdomen  . History of blood transfusion 2012  . Incisional hernia s/p open repair w mesh Aug 2013 05/26/2011  . Joint pain    knees  .  Nocturia    depends on amount of fluid he drinks  . Pneumonia 2018  . Pre-diabetes   . Primary localized osteoarthritis of left hip 04/24/2014  . Serrated polyp of colon   . Sleep apnea    no cpap  mild no cpap needed  . Stroke ([place])   . Unstable angina ([place]) 04/09/2020  . Upper GI bleed   . Ventral hernia     Current Outpatient Medications on File Prior to Visit  Medication Sig Dispense Refill  . albuterol (VENTOLIN HFA) 108 (90 Base) MCG/ACT inhaler Inhale 1-2 puffs into the lungs every 4 (four) hours as needed for wheezing or shortness of breath. 8 g 0  . amLODipine (NORVASC) 10 MG tablet Take 1 tablet (10 mg total) by mouth daily. 90 tablet 3  . aspirin EC 81 MG EC tablet Take 1 tablet (81 mg total) by mouth daily.    . carvedilol (COREG) 3.125 MG tablet Take 3.125 mg by mouth 2 (two) times daily with a  meal.    . clonazePAM (KLONOPIN) 0.5 MG tablet TAKE 1 TABLET (0.5 MG TOTAL) BY MOUTH AT BEDTIME. 30 tablet 0  . cyclobenzaprine (FLEXERIL) 10 MG tablet TAKE 1 TABLET BY MOUTH 3 TIMES EVERY DAY AS NEEDED FOR MUSCLE SPASMS 60 tablet 0  . fluticasone (FLONASE) 50 MCG/ACT nasal spray Place 2 sprays into both nostrils daily as needed for allergies. 16 g 11  . gabapentin (NEURONTIN) 300 MG capsule TAKE 1 CAPSULE (300 MG TOTAL) BY MOUTH 3 (THREE) TIMES DAILY. 90 capsule 2  . HYDROcodone-acetaminophen (NORCO) 10-325 MG tablet Take 1 tablet by mouth every 6 (six) hours as needed. 30 tablet 0  . meloxicam (MOBIC) 7.5 MG tablet Take 7.5 mg by mouth 2 (two) times daily.    . ondansetron (ZOFRAN) 4 MG tablet TAKE 1 TABLET BY MOUTH EVERY 8 HOURS AS NEEDED FOR NAUSEA OR VOMITING. 20 tablet 1  . pantoprazole (PROTONIX) 40 MG tablet TAKE 1 TABLET (40 MG TOTAL) BY MOUTH 2 (TWO) TIMES DAILY. 180 tablet 2  . rosuvastatin (CRESTOR) 20 MG tablet Take 1 tablet (20 mg total) by mouth daily. 90 tablet 3  . Semaglutide-Weight Management 0.25 MG/0.5ML SOAJ Inject 0.25 mg into the skin once a week for 28 days. 2 mL 0  . [START ON 05/29/2020] Semaglutide-Weight Management 0.5 MG/0.5ML SOAJ Inject 0.5 mg into the skin once a week for 28 days. 2 mL 0  . [START ON 06/27/2020] Semaglutide-Weight Management 1 MG/0.5ML SOAJ Inject 1 mg into the skin once a week for 28 days. 2 mL 0  . [START ON 07/26/2020] Semaglutide-Weight Management 1.7 MG/0.75ML SOAJ Inject 1.7 mg into the skin once a week for 28 days. 3 mL 0  . [START ON 08/24/2020] Semaglutide-Weight Management 2.4 MG/0.75ML SOAJ Inject 2.4 mg into the skin once a week. 3 mL 5  . Topiramate ER (TROKENDI XR) 50 MG CP24 Take 50 mg by mouth at bedtime. 30 capsule 5  . zolpidem (AMBIEN CR) 12.5 MG CR tablet TAKE 1 TABLET (12.5 MG TOTAL) BY MOUTH AT BEDTIME. 30 tablet 0   No current facility-administered medications on file prior to visit.    Allergies  Allergen Reactions  .  Dilaudid [Hydromorphone Hcl] Rash and Other (See Comments)    Shakey  . Hydromorphone Hcl Other (See Comments) and Rash    Shakey  . Metoclopramide Other (See Comments), Rash, Swelling and Hives    Swelling in lips  . Metoclopramide Hcl Hives, Swelling, Rash and  Other (See Comments)    Swelling in lips  . Nsaids     H/o bleeding gastric ulcers  . Benadryl [Diphenhydramine]     Causes patient to become hyper and pace     Assessment/Plan:  1. Weight loss - Patient has not met goal of at least 5% of body weight loss with comprehensive lifestyle modifications alone in the past 3-6 months. Pharmacotherapy is appropriate to pursue as augmentation. Will start Wegovy 0.25 mg SQ once weekly x4 weeks. At that time will increase to 0.5 mg weekly x4 weeks, then 1 mg weekly x4 weeks, then 1.7 mg weekly x4 weeks, then 2.4 mg weekly (maintenance dose). Confirmed patient has no personal or family history of medullary thyroid carcinoma (MTC) or Multiple Endocrine Neoplasia syndrome type 2 (MEN 2).   Advised patient on common side effects including nausea, diarrhea, dyspepsia, decreased appetite, and fatigue. Counseled patient on reducing meal size and how to titrate medication to minimize side effects. Counseled him to try to break the habit of getting up in the middle of the night to eat. Counseled patient to call if intolerable side effects or if experiencing dehydration, abdominal pain, or dizziness. Patient will adhere to dietary modifications and will target at least 150 minutes of moderate intensity exercise weekly.   Given that his A1c has been borderline for diabetes in the past and last A1c from 07/2019 was 6.2%, will recheck an A1c today to get a new baseline. He also mentions that many family members have diabetes. Will recheck a follow up A1c and lipid panel at 6 months.   Follow up phone call in 1 month prior to time of next dose titration. Follow up visit scheduled for 3 months.   Rebbeca Paul,  PharmD PGY1 Pharmacy Resident 05/01/2020 1:38 PM

## 2020-05-01 ENCOUNTER — Ambulatory Visit (INDEPENDENT_AMBULATORY_CARE_PROVIDER_SITE_OTHER): Payer: No Typology Code available for payment source | Admitting: Student-PharmD

## 2020-05-01 ENCOUNTER — Other Ambulatory Visit: Payer: Self-pay | Admitting: Internal Medicine

## 2020-05-01 ENCOUNTER — Other Ambulatory Visit: Payer: Self-pay

## 2020-05-01 VITALS — Wt 239.2 lb

## 2020-05-01 DIAGNOSIS — E669 Obesity, unspecified: Secondary | ICD-10-CM | POA: Diagnosis not present

## 2020-05-01 DIAGNOSIS — E66811 Obesity, class 1: Secondary | ICD-10-CM | POA: Insufficient documentation

## 2020-05-01 DIAGNOSIS — E6609 Other obesity due to excess calories: Secondary | ICD-10-CM | POA: Insufficient documentation

## 2020-05-01 DIAGNOSIS — E663 Overweight: Secondary | ICD-10-CM | POA: Insufficient documentation

## 2020-05-01 NOTE — Patient Instructions (Addendum)
OEUMPN Counseling Points Dosing Schedule: -Inject 0.25 mg once weekly x 4 weeks -Then, inject 0.5 mg once weekly x 4 weeks -Then, inject 1 mg once weekly x 4 weeks -Then, inject 1.7 mg once weekly x 4 weeks -Then, inject 2.4 mg once weekly thereafter  1. This medication reduces your appetite and may make you feel fuller longer.  2. Stop eating when your body tells you that you are full. This will likely happen sooner than you are used to. 3. Store your medication in the fridge until you are ready to use it. 4. Inject your medication in the fatty tissue of your lower abdominal area (2 inches away from belly button) or upper outer thigh. 5. Common side effects include: nausea, diarrhea/constipation, and heartburn, and are more likely to occur if you overeat.  We will call you in 4 weeks around the time of your dose increase to see how you are doing. Call us if you have any questions or concerns: [phone].  Follow up office visit in 3 months on June 30th at 8:30  Tips for living a healthier life  SUGAR  Sugar is a huge problem in the modern day diet. Sugar is a big contributor to heart disease, diabetes, high triglyceride levels, fatty liver disease and obesity. Sugar is hidden in almost all packaged foods/beverages. Added sugar is extra sugar that is added beyond what is naturally found and has no nutritional benefit for your body. The American Heart Association recommends limiting added sugars to no more than 25g for women and 36 grams for men per day. There are many names for sugar including maltose, sucrose (names ending in "ose"), high fructose corn syrup, molasses, cane sugar, corn sweetener, raw sugar, syrup, honey or fruit juice concentrate.   One of the best ways to limit your added sugars is to stop drinking sweetened beverages such as soda, sweet tea, and fruit juice. There is 65g of added sugars in one 20oz bottle of Coke! That is equal to 6 donuts.   Pay attention and read  all nutrition facts labels. Below is an examples of a nutrition facts label. The #1 is showing you the total sugars where the # 2 is showing you the added sugars. This one serving has almost the max amount of added sugars per day!     EXERCISE  Exercise is good. We've all heard that. In an ideal world, we would all have time and resources to get plenty of it. When you are active, your heart pumps more efficiently and you will feel better.  Multiple studies show that even walking regularly has benefits that include living a longer life. The American Heart Association recommends 150 minutes per week of exercise (30 minutes per day most days of the week). You can do this in any increment you wish. Nine or more 10-minute walks count. So does an hour-long exercise class. Break the time apart into what will work in your life. Some of the best things you can do include walking briskly, jogging, cycling or swimming laps. Not everyone is ready to "exercise." Sometimes we need to start with just getting active. Here are some easy ways to be more active throughout the day: Marland Kitchen Take the stairs instead of the elevator . Go for a 10-15 minute walk during your lunch break (find a friend to make it more enjoyable) . When shopping, park at the back of the parking lot . If you take public transportation, get off one stop early and  walk the extra distance . Pace around while making phone calls  Check with your doctor if you aren't sure what your limitations may be. Always remember to drink plenty of water when doing any type of exercise. Don't feel like a failure if you're not getting the 90-150 minutes per week. If you started by being a couch potato, then just a 10-minute walk each day is a huge improvement. Start with little victories and work your way up.   HEALTHY EATING TIPS  When looking to improve your eating habits, whether to lose weight, lower blood pressure or just be healthier, it helps to know what a  serving size is.   Grains 1 slice of bread,  bagel,  cup pasta or rice  Vegetables 1 cup fresh or raw vegetables,  cup cooked or canned Fruits 1 piece of medium sized fruit,  cup canned,   Meats/Proteins  cup dried       1 oz meat, 1 egg,  cup cooked beans, nuts or seeds  Dairy        Fats Individual yogurt container, 1 cup (8oz)    1 teaspoon margarine/butter or vegetable  milk or milk alternative, 1 slice of cheese          oil; 1 tablespoon mayonnaise or salad dressing                  Plan ahead: make a menu of the meals for a week then create a grocery list to go with that menu. Consider meals that easily stretch into a night of leftovers, such as stews or casseroles. Or consider making two of your favorite meal and put one in the freezer for another night. Try a night or two each week that is "meatless" or "no cook" such as salads. When you get home from the grocery store wash and prepare your vegetables and fruits. Then when you need them they are ready to go.   Tips for going to the grocery store: . Buy store or generic brands . Check the weekly ad from your store on-line or in their in-store flyer . Look at the unit price on the shelf tag to compare/contrast the costs of different items . Buy fruits/vegetables in season . Carrots, bananas and apples are low-cost, naturally healthy items . If meats or frozen vegetables are on sale, buy some extras and put in your freezer . Limit buying prepared or "ready to eat" items, even if they are pre-made salads or fruit snacks . Do not shop when you're hungry . Foods at eye level tend to be more expensive. Look on the high and low shelves for deals. . Consider shopping at the farmer's market for fresh foods in season. Marland Kitchen Avoid the cookie and chip aisles (these are expensive, high in calories and low in nutritional value). Shop on the outside of the grocery store.  Healthy food preparations: . If you can't get lean hamburger, be sure  to drain the fat when cooking . Steam, saut (in olive oil), grill or bake foods . Experiment with different seasonings to avoid adding salt to your foods. Kosher salt, sea salt and Himalayan salt are all still salt and should be avoided. Try seasoning food with onion, garlic, thyme, rosemary, basil ect. Onion powder or garlic powder is ok. Avoid if it says salt (ie garlic salt).        Other resources: American Heart Association - InstantFinish.fi         Go  to the Healthy Living tab to get more information American Diabetes Association - www.diabetes.org         You don't have to be diabetic - check out the Food and Fitness tab

## 2020-05-01 NOTE — Telephone Encounter (Signed)
Klonopin and Ambien last filled 04/02/2020... please advise

## 2020-05-02 ENCOUNTER — Other Ambulatory Visit: Payer: Self-pay | Admitting: Internal Medicine

## 2020-05-02 ENCOUNTER — Telehealth: Payer: Self-pay | Admitting: Student-PharmD

## 2020-05-02 DIAGNOSIS — E119 Type 2 diabetes mellitus without complications: Secondary | ICD-10-CM | POA: Insufficient documentation

## 2020-05-02 LAB — HEMOGLOBIN A1C
Est. average glucose Bld gHb Est-mCnc: 143 mg/dL
Hgb A1c MFr Bld: 6.6 % — ABNORMAL HIGH (ref 4.8–5.6)

## 2020-05-02 NOTE — Telephone Encounter (Signed)
Called patient regarding A1c results from yesterday. With A1c of 6.6%, patient now has diabetes. He mentioned yesterday that he was expecting his A1c to be higher than previous since it had been borderline for years and he has gained weight since last year's A1c. Yesterday he was started on higher dose GLP-1 therapy with [facility] for weight loss. Since semaglutide is also approved for diabetes, this will also lower his A1c so he does not need additional diabetes therapy. Reports some nausea this morning after first Wegovy injection yesterday. Counseled him that this should improve with time but if it is intolerable to call and let us know. Otherwise we will plan to call him in 4 weeks prior to timing of next dose titration.

## 2020-05-03 NOTE — Telephone Encounter (Signed)
I do not see where you have ever filled this medication...Marland Kitchen please advise

## 2020-05-04 NOTE — Telephone Encounter (Signed)
Why has he been getting this RX from so many providers? Is he taking BID as prescribed?

## 2020-05-05 ENCOUNTER — Other Ambulatory Visit: Payer: Self-pay | Admitting: Internal Medicine

## 2020-05-05 ENCOUNTER — Other Ambulatory Visit (HOSPITAL_COMMUNITY): Payer: Self-pay

## 2020-05-06 ENCOUNTER — Other Ambulatory Visit (HOSPITAL_COMMUNITY): Payer: Self-pay

## 2020-05-06 NOTE — Telephone Encounter (Signed)
There should be another request with questions I had wanted to ask the patient. Awaiting response.

## 2020-05-07 NOTE — Telephone Encounter (Signed)
Does he have a history of bleeding gastric ulcers? Also has history allergy to NSAID's

## 2020-05-08 ENCOUNTER — Other Ambulatory Visit (HOSPITAL_COMMUNITY): Payer: Self-pay

## 2020-05-09 NOTE — Telephone Encounter (Signed)
Meloxicam refused. He should not be taking this

## 2020-05-16 ENCOUNTER — Other Ambulatory Visit (HOSPITAL_COMMUNITY): Payer: Self-pay

## 2020-05-16 MED FILL — Semaglutide (Weight Mngmt) Soln Auto-Injector 0.5 MG/0.5ML: SUBCUTANEOUS | 28 days supply | Qty: 2 | Fill #0 | Status: CN

## 2020-05-21 ENCOUNTER — Other Ambulatory Visit: Payer: Self-pay

## 2020-05-21 ENCOUNTER — Other Ambulatory Visit (HOSPITAL_COMMUNITY): Payer: Self-pay

## 2020-05-21 ENCOUNTER — Other Ambulatory Visit: Payer: Self-pay | Admitting: Internal Medicine

## 2020-05-21 MED FILL — Semaglutide (Weight Mngmt) Soln Auto-Injector 0.5 MG/0.5ML: SUBCUTANEOUS | 28 days supply | Qty: 2 | Fill #0 | Status: AC

## 2020-05-21 NOTE — Telephone Encounter (Signed)
Refill request 02/09/20 #90/2 Last office visit 04/16/20

## 2020-05-22 ENCOUNTER — Other Ambulatory Visit (HOSPITAL_COMMUNITY): Payer: Self-pay

## 2020-05-22 MED ORDER — GABAPENTIN 300 MG PO CAPS
ORAL_CAPSULE | Freq: Three times a day (TID) | ORAL | 2 refills | Status: DC
  Filled 2020-05-22: qty 90, 30d supply, fill #0
  Filled 2020-06-21: qty 90, 30d supply, fill #1
  Filled 2020-07-26: qty 90, 30d supply, fill #2

## 2020-05-24 ENCOUNTER — Other Ambulatory Visit (HOSPITAL_COMMUNITY): Payer: Self-pay

## 2020-05-27 ENCOUNTER — Telehealth: Payer: Self-pay | Admitting: Pharmacist

## 2020-05-27 NOTE — Telephone Encounter (Signed)
Called pt to follow up with [facility] tolerability over the past month. Reports doing excellent on injections, tolerating well aside from having some gas. Portions have been down, states home weight is down to 224 lbs (15 lb weight loss from our baseline weight in office). He has picked up his next dose of 0.5mg  that he'll use for the next month and is aware his dose will keep increasing as tolerated. Will keep 3 month follow up in office as scheduled and pt advised to call clinic with any issues before then.

## 2020-05-30 ENCOUNTER — Other Ambulatory Visit (HOSPITAL_COMMUNITY): Payer: Self-pay

## 2020-05-30 ENCOUNTER — Other Ambulatory Visit: Payer: Self-pay | Admitting: Internal Medicine

## 2020-05-30 MED FILL — Ondansetron HCl Tab 4 MG: ORAL | 7 days supply | Qty: 20 | Fill #0 | Status: AC

## 2020-05-30 MED FILL — Topiramate Cap ER 24HR 50 MG: ORAL | 30 days supply | Qty: 30 | Fill #0 | Status: AC

## 2020-05-31 ENCOUNTER — Other Ambulatory Visit: Payer: Self-pay | Admitting: Internal Medicine

## 2020-05-31 ENCOUNTER — Other Ambulatory Visit (HOSPITAL_COMMUNITY): Payer: Self-pay

## 2020-05-31 NOTE — Telephone Encounter (Signed)
Last filled 05/01/2020--- pt has upcoming appt with you in May... please advise

## 2020-06-04 ENCOUNTER — Other Ambulatory Visit: Payer: Self-pay | Admitting: Internal Medicine

## 2020-06-04 ENCOUNTER — Other Ambulatory Visit (HOSPITAL_COMMUNITY): Payer: Self-pay

## 2020-06-04 MED ORDER — CYCLOBENZAPRINE HCL 10 MG PO TABS
ORAL_TABLET | ORAL | 0 refills | Status: DC
  Filled 2020-06-04: qty 60, 20d supply, fill #0

## 2020-06-04 MED ORDER — CLONAZEPAM 0.5 MG PO TABS
ORAL_TABLET | Freq: Every day | ORAL | 0 refills | Status: DC
  Filled 2020-06-04: qty 30, 30d supply, fill #0

## 2020-06-04 MED ORDER — ZOLPIDEM TARTRATE ER 12.5 MG PO TBCR
EXTENDED_RELEASE_TABLET | Freq: Every day | ORAL | 0 refills | Status: DC
  Filled 2020-06-04: qty 30, 30d supply, fill #0

## 2020-06-04 MED FILL — Semaglutide (Weight Mngmt) Soln Auto-Injector 1 MG/0.5ML: SUBCUTANEOUS | 28 days supply | Qty: 2 | Fill #0 | Status: AC

## 2020-06-04 NOTE — Telephone Encounter (Signed)
   Notes to clinic: Patient has appointment on 06/06/2020 Review for refills   Requested Prescriptions  Pending Prescriptions Disp Refills   zolpidem (AMBIEN CR) 12.5 MG CR tablet 30 tablet 0    Sig: TAKE 1 TABLET BY MOUTH AT BEDTIME.      Not Delegated - Psychiatry:  Anxiolytics/Hypnotics Failed - 06/04/2020  1:03 PM      Failed - This refill cannot be delegated      Failed - Urine Drug Screen completed in last 360 days      Failed - Valid encounter within last 6 months    Recent Outpatient Visits   None     Future Appointments             In 2 days Baity, Coralie Keens, NP [facility], [place]   In 1 month  [place], [facility]               cyclobenzaprine (FLEXERIL) 10 MG tablet 60 tablet 0    Sig: TAKE 1 TABLET BY MOUTH 3 TIMES EVERY DAY AS NEEDED FOR MUSCLE SPASMS      Not Delegated - Analgesics:  Muscle Relaxants Failed - 06/04/2020  1:03 PM      Failed - This refill cannot be delegated      Failed - Valid encounter within last 6 months    Recent Outpatient Visits   None     Future Appointments             In 2 days Baity, Coralie Keens, NP [facility], [place]   In 1 month  [place], [facility]

## 2020-06-06 ENCOUNTER — Other Ambulatory Visit: Payer: Self-pay

## 2020-06-06 ENCOUNTER — Telehealth: Payer: No Typology Code available for payment source | Admitting: Internal Medicine

## 2020-06-06 ENCOUNTER — Encounter: Payer: Self-pay | Admitting: Internal Medicine

## 2020-06-06 VITALS — Temp 98.0°F

## 2020-06-06 DIAGNOSIS — Z91199 Patient's noncompliance with other medical treatment and regimen due to unspecified reason: Secondary | ICD-10-CM

## 2020-06-06 DIAGNOSIS — Z5329 Procedure and treatment not carried out because of patient's decision for other reasons: Secondary | ICD-10-CM

## 2020-06-06 NOTE — Progress Notes (Signed)
No show for appt. 

## 2020-06-07 ENCOUNTER — Other Ambulatory Visit (HOSPITAL_COMMUNITY): Payer: Self-pay

## 2020-06-10 ENCOUNTER — Institutional Professional Consult (permissible substitution): Payer: No Typology Code available for payment source | Admitting: Internal Medicine

## 2020-06-21 ENCOUNTER — Other Ambulatory Visit (HOSPITAL_COMMUNITY): Payer: Self-pay

## 2020-06-27 ENCOUNTER — Other Ambulatory Visit (HOSPITAL_COMMUNITY): Payer: Self-pay

## 2020-06-27 MED FILL — Semaglutide (Weight Mngmt) Soln Auto-Injector 1.7 MG/0.75ML: SUBCUTANEOUS | 28 days supply | Qty: 3 | Fill #0 | Status: AC

## 2020-06-28 ENCOUNTER — Other Ambulatory Visit (HOSPITAL_COMMUNITY): Payer: Self-pay

## 2020-07-02 ENCOUNTER — Other Ambulatory Visit: Payer: Self-pay | Admitting: Internal Medicine

## 2020-07-02 DIAGNOSIS — K219 Gastro-esophageal reflux disease without esophagitis: Secondary | ICD-10-CM

## 2020-07-02 NOTE — Progress Notes (Signed)
NEUROLOGY FOLLOW UP OFFICE NOTE  TEDD COTTRILL [id number]  Assessment/Plan:   1.  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  Headache 4.  Hypertension 5.  Hyperlipidemia 6.  Type 2 diabetes mellitus  1.  Headache prophylaxis:  Trokendi XR 50mg  QHS 2.  Headache rescue:  Tylenol 3.  Limit use of pain relievers to no more than 2 days out of week to prevent risk of rebound or medication-overuse headache. 4.  Secondary stroke prevention as managed by PCP: - ASA 81mg  daily - statin.  LDL goal less than 70 - Normotensive blood pressure - follow up with PCP regarding elevated blood pressure - Glycemic control.  Hgb A1c goal less than 7 5.  Follow up one year  Subjective:  Randell Teare is a 50year old man with unstable anginawho follows up for stroke, myelopathy and headache.  UPDATE: Current medications:  Trokendi XR 50mg  QHS, ASA 81mg , amlodipine, carvedilol, lisinopril, gabapentin 300mg  TID,, ondansetron 4mg , zolpidem 12.5mg   Due to elevated blood pressure, nortriptyline was discontinued and he was switched to Trokendi  They are moderate and usually occur a couple of weekends a month.  He treats with Tylenol and headaches last 3 hours.     Has some numbness in the arms and hands which is residual from carpal tunnel syndrome.  He underwent surgery earlier this year.    l  HISTORY: 1. Stroke: He was admitted to [facility] from 10/05/2017 to 10/07/2017 after presenting with vertigo. 2 days prior, he began experiencing sensation of room spinning with right arm numbness, paresthesias and weakness and right leg paresthesiasassociated with headache and photophobia. MRI of the brain revealed a probable punctate acute/early subacute ischemic infarct within the left thalamo-mesencephalic junction. MRA of the head showed no aneurysm or large vessel occlusion or stenosis. Carotid  Doppler showed no hemodynamically significant extracranial stenosis. 2D echo demonstrated normal ejection fraction with no cardiac source of emboli. LDL was 76. Hemoglobin A1c was 5.9. He was started on Lipitor 40mg  daily.He was discharged on aspirin 81 mg and Plavix 75 mg daily for 3 weeks, followed by aspirin alone.  2. Headache: Followingdischarge, he continued to have a constantsevereright-sided pressure-like headache involving the right temple and right side of face associated with right sided facial numbness and tingling. He also noted worsening slurred speech, right upper extremity numbness and weakness. He developed new blurred peripheral vision in his right eye. He also endorsed postural lightheadedness when standing but not vertigo. He presented to the ED on 11/06/17 for further evaluation. Labs were unremarkable. CT of head demonstrated no acute findings. MRI of brain was normal with resolution of prior punctate focus. He was treated with a migraine cocktail and discharged. Past medication:  Nortriptyline (helpful but discontinued due to possibly contributing to elevated blood pressure)  3. Myelopathy: Following the stroke, he developednumbness and tingling in both arms and legsfor about a monthHe reports tightness and numbness and tingling in glove distribution up to elbows. He reports tightness between his shoulder blades but no significant neck pain or radicular pain down the arms. He also reports tightness and soreness in his hamstrings and his legs feel wobbly. He denies low back pain. It is a constant feeling. He reports aches and soreness in the arms and legs.Labs from 12/28/17 included TSH 2.30, B12 1,113, and elevated CK of 551. He was advised to stop atorvastatin but symptoms have persisted. Labs from 01/04/18  showed reduced CK of 306, CRP 0.1, vitamin D 29.69, negative Lyme IgG and IgM antibodies, and negative RMSF antibody. 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  He was referred to neurosurgery and underwent C3-C7 ACDF on 04/08/18.  PAST MEDICAL HISTORY: Past Medical History:  Diagnosis Date  . Anxiety    takes Xanax daily prn, panic attacks  . Chronic gastric ulcer with bleeding s/p suture repair 2012 09/29/2010   Head 3 EGD which failed to stop the bleeding. Had exploratory laparotomy on 10/04/2010. Possible cause of the gastric ulcer was NSAID use.   . Constipation    related to pain meds  . Depression   . Dizziness    occasionally  . Dyspnea    walking a bit  . Erosive esophagitis   . External hemorrhoid, bleeding   . Gastric ulcer    history of  . GERD (gastroesophageal reflux disease)    takes Protonix bid  . Hiatal hernia   . History of blood clots 2012   in abdomen  . History of blood transfusion 2012  . Incisional hernia s/p open repair w mesh Aug 2013 05/26/2011  . Joint pain    knees  . Nocturia    depends on amount of fluid he drinks  . Pneumonia 2018  . Pre-diabetes   . Primary localized osteoarthritis of left hip 04/24/2014  . Serrated polyp of colon   . Sleep  apnea    no cpap  mild no cpap needed  . Stroke ([place])   . Unstable angina ([place]) 04/09/2020  . Upper GI bleed   . Ventral hernia     MEDICATIONS: Current Outpatient Medications on File Prior to Visit  Medication Sig Dispense Refill  . albuterol (VENTOLIN HFA) 108 (90 Base) MCG/ACT inhaler INHALE 1-2 PUFFS INTO THE LUNGS EVERY 4 (FOUR) HOURS AS NEEDED FOR WHEEZING OR SHORTNESS OF BREATH. 18 g 0  . amLODipine (NORVASC) 10 MG tablet TAKE 1 TABLET (10 MG TOTAL) BY MOUTH DAILY. 90 tablet 3  . aspirin EC 81 MG EC tablet Take 1 tablet (81 mg total) by mouth daily.    . carvedilol (COREG) 3.125 MG tablet Take 3.125 mg by mouth 2 (two) times daily with a meal.    . clonazePAM (KLONOPIN) 0.5 MG tablet TAKE 1 TABLET BY MOUTH AT BEDTIME. 30 tablet 0  . COVID-19 mRNA vaccine, Pfizer, 30 MCG/0.3ML injection USE AS DIRECTED .3 mL 0  . cyclobenzaprine (FLEXERIL) 10 MG tablet TAKE 1 TABLET BY MOUTH 3 TIMES EVERY DAY AS NEEDED FOR MUSCLE SPASMS 60 tablet 0  . cyclobenzaprine (FLEXERIL) 10 MG tablet TAKE 1 TABLET BY MOUTH 3 TIMES EVERY DAY AS NEEDED FOR MUSCLE SPASMS (Patient not taking: Reported on 06/06/2020) 60 tablet 0  . fluticasone (FLONASE)  50 MCG/ACT nasal spray Place 2 sprays into both nostrils daily as needed for allergies. 16 g 11  . gabapentin (NEURONTIN) 300 MG capsule TAKE 1 CAPSULE BY MOUTH 3 TIMES DAILY. 90 capsule 2  . HYDROcodone-acetaminophen (NORCO) 10-325 MG tablet Take 1 tablet by mouth every 6 (six) hours as needed. 30 tablet 0  . meloxicam (MOBIC) 7.5 MG tablet Take 7.5 mg by mouth 2 (two) times daily. (Patient not taking: Reported on 06/06/2020)    . ondansetron (ZOFRAN) 4 MG tablet TAKE 1 TABLET BY MOUTH EVERY 8 HOURS AS NEEDED FOR NAUSEA OR VOMITING. 20 tablet 1  . pantoprazole (PROTONIX) 40 MG tablet TAKE 1 TABLET (40 MG TOTAL) BY MOUTH 2 (TWO) TIMES DAILY. 180 tablet 2  . rosuvastatin (CRESTOR) 20 MG tablet TAKE 1 TABLET (20 MG TOTAL) BY MOUTH DAILY. 90 tablet 3  . Semaglutide-Weight  Management 0.5 MG/0.5ML SOAJ INJECT 0.5 MG INTO THE SKIN ONCE A WEEK 2 mL 0  . Semaglutide-Weight Management 1 MG/0.5ML SOAJ INJECT 1 MG INTO THE SKIN ONCE A WEEK FOR 28 DAYS (Patient not taking: Reported on 06/06/2020) 2 mL 0  . Semaglutide-Weight Management 1.7 MG/0.75ML SOAJ INJECT 1.7 MG INTO THE SKIN ONCE A WEEK (Patient not taking: Reported on 06/06/2020) 3 mL 0  . Semaglutide-Weight Management 2.4 MG/0.75ML SOAJ INJECT 2.4 MG INTO THE SKIN ONCE A WEEK. (Patient not taking: Reported on 06/06/2020) 3 mL 5  . Topiramate ER (TROKENDI XR) 50 MG CP24 TAKE 1 CAPSULE (50 MG) BY MOUTH AT BEDTIME. 30 capsule 5  . zolpidem (AMBIEN CR) 12.5 MG CR tablet TAKE 1 TABLET BY MOUTH AT BEDTIME. 30 tablet 0  . [DISCONTINUED] lisinopril (ZESTRIL) 20 MG tablet TAKE 1 TABLET (20 MG TOTAL) BY MOUTH DAILY. 30 tablet 2  . [DISCONTINUED] nortriptyline (PAMELOR) 25 MG capsule TAKE 1 CAPSULE BY MOUTH AT BEDTIME 30 capsule 0  . [DISCONTINUED] simvastatin (ZOCOR) 10 MG tablet Take 1 tablet (10 mg total) by mouth daily. 90 tablet 2   No current facility-administered medications on file prior to visit.    ALLERGIES: Allergies  Allergen Reactions  . Dilaudid [Hydromorphone Hcl] Rash and Other (See Comments)    Shakey  . Hydromorphone Hcl Other (See Comments) and Rash    Shakey  . Metoclopramide Other (See Comments), Rash, Swelling and Hives    Swelling in lips  . Metoclopramide Hcl Hives, Swelling, Rash and Other (See Comments)    Swelling in lips  . Nsaids     H/o bleeding gastric ulcers  . Benadryl [Diphenhydramine]     Causes patient to become hyper and pace    FAMILY HISTORY: Family History  Problem Relation Age of Onset  . Diabetes Mother   . Liver disease Mother   . Diabetes Father   . Heart disease Father   . Healthy Sister   . Healthy Brother   . Healthy Brother   . Colon cancer Neg Hx   . Stomach cancer Neg Hx   . Rectal cancer Neg Hx   . Esophageal cancer Neg Hx   . Colon polyps Neg Hx        Objective:  Blood pressure (!) 159/90, pulse (!) 103, height 5\' 9"  (1.753 m), weight 216 lb (98 kg), SpO2 96 %. General: No acute distress.  Patient appears well-groomed.   Head:  Normocephalic/atraumatic Eyes:  Fundi examined but not visualized Neck: supple, no paraspinal tenderness, full range of motion Heart:  Regular rate and rhythm Lungs:  Clear to auscultation  bilaterally Back: No paraspinal tenderness Neurological Exam: alert and oriented to person, place, and time. Speech fluent and not dysarthric, language intact.  CN II-XII intact. Bulk and tone normal, muscle strength 5/5 throughout.  Sensation to pinprick and vibration intact.  Deep tendon reflexes 2+ throughout, toes downgoing.  Finger to nose and heel to shin testing intact.  Gait normal, Romberg negative.     Metta Clines, DO  CC: Webb Silversmith, NP

## 2020-07-03 ENCOUNTER — Encounter: Payer: Self-pay | Admitting: Neurology

## 2020-07-03 ENCOUNTER — Ambulatory Visit (INDEPENDENT_AMBULATORY_CARE_PROVIDER_SITE_OTHER): Payer: No Typology Code available for payment source | Admitting: Neurology

## 2020-07-03 ENCOUNTER — Other Ambulatory Visit: Payer: Self-pay

## 2020-07-03 VITALS — BP 159/90 | HR 103 | Ht 69.0 in | Wt 216.0 lb

## 2020-07-03 DIAGNOSIS — E7800 Pure hypercholesterolemia, unspecified: Secondary | ICD-10-CM

## 2020-07-03 DIAGNOSIS — G992 Myelopathy in diseases classified elsewhere: Secondary | ICD-10-CM

## 2020-07-03 DIAGNOSIS — I639 Cerebral infarction, unspecified: Secondary | ICD-10-CM | POA: Diagnosis not present

## 2020-07-03 DIAGNOSIS — M4802 Spinal stenosis, cervical region: Secondary | ICD-10-CM | POA: Diagnosis not present

## 2020-07-03 DIAGNOSIS — I10 Essential (primary) hypertension: Secondary | ICD-10-CM

## 2020-07-03 DIAGNOSIS — R519 Headache, unspecified: Secondary | ICD-10-CM | POA: Diagnosis not present

## 2020-07-03 DIAGNOSIS — E119 Type 2 diabetes mellitus without complications: Secondary | ICD-10-CM

## 2020-07-03 NOTE — Telephone Encounter (Signed)
Refill request Pantoprazole Last refill 10/13/19 #180/2 Last office visit 06/06/20 video No upcoming appointment scheduled

## 2020-07-03 NOTE — Patient Instructions (Signed)
1.  Continue Trokendi XR 50mg  QHS 2.  TakeTylenol for headache attack 3.  Limit use of pain relievers to no more than 2 days out of week to prevent risk of rebound or medication-overuse headache. 4.  Continue aspirin 81mg  daily 5.  Continue statin therapy and blood sugar control 6.  Follow up with PCP regarding blood pressure 7.  Follow up with me in one year

## 2020-07-04 ENCOUNTER — Other Ambulatory Visit: Payer: Self-pay | Admitting: Neurology

## 2020-07-04 ENCOUNTER — Other Ambulatory Visit: Payer: Self-pay | Admitting: Internal Medicine

## 2020-07-04 ENCOUNTER — Other Ambulatory Visit (HOSPITAL_COMMUNITY): Payer: Self-pay

## 2020-07-04 MED ORDER — ONDANSETRON HCL 4 MG PO TABS
ORAL_TABLET | Freq: Three times a day (TID) | ORAL | 1 refills | Status: DC | PRN
  Filled 2020-07-04: qty 20, 7d supply, fill #0
  Filled 2020-08-01: qty 20, 7d supply, fill #1

## 2020-07-04 MED ORDER — CLONAZEPAM 0.5 MG PO TABS
ORAL_TABLET | Freq: Every day | ORAL | 0 refills | Status: DC
  Filled 2020-07-04: qty 30, 30d supply, fill #0

## 2020-07-04 MED ORDER — PANTOPRAZOLE SODIUM 40 MG PO TBEC
DELAYED_RELEASE_TABLET | Freq: Two times a day (BID) | ORAL | 2 refills | Status: DC
  Filled 2020-07-04: qty 180, 90d supply, fill #0
  Filled 2020-10-02: qty 180, 90d supply, fill #1
  Filled 2020-12-25: qty 180, 90d supply, fill #2

## 2020-07-04 MED ORDER — ZOLPIDEM TARTRATE ER 12.5 MG PO TBCR
EXTENDED_RELEASE_TABLET | Freq: Every day | ORAL | 0 refills | Status: DC
  Filled 2020-07-04: qty 30, 30d supply, fill #0

## 2020-07-04 MED ORDER — CYCLOBENZAPRINE HCL 10 MG PO TABS
ORAL_TABLET | ORAL | 0 refills | Status: DC
  Filled 2020-07-04: qty 60, 20d supply, fill #0

## 2020-07-04 MED ORDER — TROKENDI XR 50 MG PO CP24
ORAL_CAPSULE | ORAL | 5 refills | Status: DC
  Filled 2020-07-04: qty 30, 30d supply, fill #0
  Filled 2020-08-01: qty 30, 30d supply, fill #1

## 2020-07-04 NOTE — Telephone Encounter (Signed)
Requested medication (s) are due for refill today: yes   Requested medication (s) are on the active medication list: yes   Last refill:  05/31/2020  Future visit scheduled: no   Notes to clinic:  this refill cannot be delegated    Requested Prescriptions  Pending Prescriptions Disp Refills   ondansetron (ZOFRAN) 4 MG tablet 20 tablet 1    Sig: TAKE 1 TABLET BY MOUTH EVERY 8 HOURS AS NEEDED FOR NAUSEA OR VOMITING.      There is no refill protocol information for this order      clonazePAM (KLONOPIN) 0.5 MG tablet 30 tablet 0    Sig: TAKE 1 TABLET BY MOUTH AT BEDTIME.      There is no refill protocol information for this order

## 2020-07-04 NOTE — Telephone Encounter (Signed)
Requested medication (s) are due for refill today: yes   Requested medication (s) are on the active medication list: yes   Last refill:  06/04/2020  Future visit scheduled: no   Notes to clinic:  this refill cannot be delegated   Requested Prescriptions  Pending Prescriptions Disp Refills   zolpidem (AMBIEN CR) 12.5 MG CR tablet 30 tablet 0    Sig: TAKE 1 TABLET BY MOUTH AT BEDTIME.      Not Delegated - Psychiatry:  Anxiolytics/Hypnotics Failed - 07/04/2020 11:07 AM      Failed - This refill cannot be delegated      Failed - Urine Drug Screen completed in last 360 days      Passed - Valid encounter within last 6 months    Recent Outpatient Visits           4 weeks ago No-show for appointment   [facility] [place], Coralie Keens, NP       Future Appointments             In 4 weeks [place], [facility]                cyclobenzaprine (FLEXERIL) 10 MG tablet 60 tablet 0    Sig: TAKE 1 TABLET BY MOUTH 3 TIMES EVERY DAY AS NEEDED FOR MUSCLE SPASMS      Not Delegated - Analgesics:  Muscle Relaxants Failed - 07/04/2020 11:07 AM      Failed - This refill cannot be delegated      Passed - Valid encounter within last 6 months    Recent Outpatient Visits           4 weeks ago No-show for appointment   [facility] [place], Coralie Keens, NP       Future Appointments             In 4 weeks [place], [facility]

## 2020-07-16 ENCOUNTER — Other Ambulatory Visit (HOSPITAL_BASED_OUTPATIENT_CLINIC_OR_DEPARTMENT_OTHER): Payer: Self-pay

## 2020-07-22 ENCOUNTER — Other Ambulatory Visit (HOSPITAL_COMMUNITY): Payer: Self-pay

## 2020-07-26 ENCOUNTER — Other Ambulatory Visit (HOSPITAL_COMMUNITY): Payer: Self-pay

## 2020-08-01 ENCOUNTER — Other Ambulatory Visit: Payer: Self-pay | Admitting: Internal Medicine

## 2020-08-01 ENCOUNTER — Other Ambulatory Visit (HOSPITAL_COMMUNITY): Payer: Self-pay

## 2020-08-01 ENCOUNTER — Other Ambulatory Visit: Payer: Self-pay

## 2020-08-01 ENCOUNTER — Ambulatory Visit (INDEPENDENT_AMBULATORY_CARE_PROVIDER_SITE_OTHER): Payer: No Typology Code available for payment source | Admitting: Pharmacist

## 2020-08-01 VITALS — BP 138/100 | HR 98 | Wt 213.0 lb

## 2020-08-01 DIAGNOSIS — E669 Obesity, unspecified: Secondary | ICD-10-CM | POA: Diagnosis not present

## 2020-08-01 DIAGNOSIS — E119 Type 2 diabetes mellitus without complications: Secondary | ICD-10-CM

## 2020-08-01 DIAGNOSIS — I10 Essential (primary) hypertension: Secondary | ICD-10-CM

## 2020-08-01 MED ORDER — LISINOPRIL 10 MG PO TABS
10.0000 mg | ORAL_TABLET | Freq: Every day | ORAL | 11 refills | Status: DC
  Filled 2020-08-01: qty 90, 90d supply, fill #0
  Filled 2020-10-31: qty 90, 90d supply, fill #1
  Filled 2021-02-06: qty 90, 90d supply, fill #0
  Filled 2021-05-01: qty 90, 90d supply, fill #1

## 2020-08-01 MED ORDER — CLONAZEPAM 0.5 MG PO TABS
ORAL_TABLET | Freq: Every day | ORAL | 0 refills | Status: DC
  Filled 2020-08-01: qty 30, 30d supply, fill #0

## 2020-08-01 MED ORDER — ZOLPIDEM TARTRATE ER 12.5 MG PO TBCR
EXTENDED_RELEASE_TABLET | Freq: Every day | ORAL | 0 refills | Status: DC
  Filled 2020-08-01: qty 30, 30d supply, fill #0

## 2020-08-01 MED ORDER — CYCLOBENZAPRINE HCL 10 MG PO TABS
ORAL_TABLET | ORAL | 0 refills | Status: DC
  Filled 2020-08-01: qty 60, 20d supply, fill #0

## 2020-08-01 NOTE — Telephone Encounter (Signed)
Requested medication (s) are due for refill today: Yes  Requested medication (s) are on the active medication list: Yes  Last refill:  07/04/20  Future visit scheduled: No   Notes to clinic:  Unable to refill per protocol, cannot delegate.      Requested Prescriptions  Pending Prescriptions Disp Refills   zolpidem (AMBIEN CR) 12.5 MG CR tablet 30 tablet 0    Sig: TAKE 1 TABLET BY MOUTH AT BEDTIME.      Not Delegated - Psychiatry:  Anxiolytics/Hypnotics Failed - 08/01/2020 12:57 PM      Failed - This refill cannot be delegated      Failed - Urine Drug Screen completed in last 360 days      Passed - Valid encounter within last 6 months    Recent Outpatient Visits           1 month ago No-show for appointment   [facility] [place], Coralie Keens, NP       Future Appointments             In 2 months [place], [facility]                cyclobenzaprine (FLEXERIL) 10 MG tablet 60 tablet 0    Sig: TAKE 1 TABLET BY MOUTH 3 TIMES EVERY DAY AS NEEDED FOR MUSCLE SPASMS      Not Delegated - Analgesics:  Muscle Relaxants Failed - 08/01/2020 12:57 PM      Failed - This refill cannot be delegated      Passed - Valid encounter within last 6 months    Recent Outpatient Visits           1 month ago No-show for appointment   [facility] [place], Coralie Keens, NP       Future Appointments             In 2 months [place], [facility]

## 2020-08-01 NOTE — Patient Instructions (Addendum)
It was nice to you see you today, your baseline weight before starting Wegovy was 239 lbs and you are down to 213 lbs today!  Your blood pressure goal is < 130/30mmHg  Restart lisinopril at a lower dose of 10mg  once daily in the morning. Continue taking your other blood pressure medications including amlodipine 10mg  in the evening and carvedilol 3.125mg  twice daily  Recheck labs in 7/28, you do not need to fast for this  Follow up in clinic in 3 months for a weight check on 9/27. We will recheck your A1c and cholesterol at this visit too, come fasting  PharmD # is [phone]

## 2020-08-01 NOTE — Telephone Encounter (Signed)
   Notes to clinic:  this refill cannot be delegated Patient not showed appt on 06/06/2020   Requested Prescriptions  Pending Prescriptions Disp Refills   clonazePAM (KLONOPIN) 0.5 MG tablet 30 tablet 0    Sig: TAKE 1 TABLET BY MOUTH AT BEDTIME.      There is no refill protocol information for this order

## 2020-08-01 NOTE — Progress Notes (Signed)
Patient ID: [name]                 DOB: [birth year]                    MRN: [id number]     HPI: [name] is a 50 y.o. male patient referred to pharmacy clinic by Dr. Johney Frame to initiate weight loss therapy with GLP1-RA. 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 Pt seen by Dr Johney Frame 04/26/20 and reported working on exercising and eating better to lose weight with subsequent 8 lb weight loss.  He was seen in PharmD clinic on 3/30 and started on Wegovy 0.25mg  once weekly injections. Copay $25 with his insurance, prior auth approved through 10/30/20, no hx of thyroid cancer or pancreatitis. Baseline A1c was checked and pt had moved from prediabetic into diabetic range with A1c of 6.6%.   Pt presents today in good spirits. Denies missed doses. Reports tolerating Wegovy well with some side effects - mainly notices bloating and constipation. Used to take Miralax and thinks he will resume. Zofran has helped with some stomach pain. He has titrated up to the 1.7mg  dose so far and has given his 2nd injection on this dose. He has been staying active with walking and plans to start doing sit ups and using his stairmaster at home. Reports his appetite has cut in half since starting [facility]. Portion sizes are much smaller now. Stopped snacking in the middle of the night as well. Drinking protein shakes in the AM. Has lost almost 30 lbs so far! Recently ordered a new uniform at work because his old one was too large.  Does report BP has increased, was 157/105 recently. Checked in office today and was 138/100. Pt used to take lisinopril 20mg  daily, this was held during 04/2020 cath secondary to soft BP. Still taking amlodipine and carvedilol as prescribed.  Current weight management medications: Wegovy 1.7mg  once weekly (2 more injections left of this dose)    Previously tried meds: None   Current meds that may affect weight: Topiramate (may decrease weight)   Baseline weight/BMI: 239.2 lb (BMI 35.32) 3 month weight/BMI: 213 lb (BMI 31.5)   Insurance payor: [employer]   Diet: -Breakfast: Skips sometimes, protein shakes otherwise -Lunch: varies -Dinner: ground [place], chicken, stays away from fast food, cooking more at home now,  -Snacks: stopped snacking in the middle of the night -Drinks: water, sugar free Propel, Koolaid sugar free   Exercise: Walks a lot at work, sit ups, Metallurgist, 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   Family History: Diabetes in his father and mother; Healthy in his brother, brother, and sister; Heart disease in his father; Liver disease in his mother   Social History: Never smoker  Labs: Lab Results  Component Value Date   HGBA1C 6.6 (H) 05/01/2020    Wt Readings from Last 1 Encounters:  07/03/20 216 lb (98 kg)    BP Readings from Last 1 Encounters:  07/03/20 (!) 159/90   Pulse Readings from Last 1 Encounters:  07/03/20 (!) 103       Component Value Date/Time   CHOL 184 04/10/2020 0402   TRIG 159 (H) 04/10/2020 0402   HDL 54 04/10/2020 0402   CHOLHDL 3.4 04/10/2020 0402   VLDL 32 04/10/2020 0402   LDLCALC 98 04/10/2020 0402    Past Medical  History:  Diagnosis Date   Anxiety    takes Xanax daily prn, panic attacks   Chronic gastric ulcer with bleeding s/p suture repair 2012 09/29/2010   Head 3 EGD which failed to stop the bleeding. Had exploratory laparotomy on 10/04/2010. Possible cause of the gastric ulcer was NSAID use.    Constipation    related to pain meds   Depression    Dizziness    occasionally   Dyspnea    walking a bit   Erosive esophagitis    External hemorrhoid, bleeding    Gastric ulcer    history of   GERD (gastroesophageal reflux disease)    takes Protonix bid   Hiatal hernia    History of blood clots 2012   in abdomen   History of blood transfusion 2012    Incisional hernia s/p open repair w mesh Aug 2013 05/26/2011   Joint pain    knees   Nocturia    depends on amount of fluid he drinks   Pneumonia 2018   Pre-diabetes    Primary localized osteoarthritis of left hip 04/24/2014   Serrated polyp of colon    Sleep apnea    no cpap  mild no cpap needed   Stroke [facility])    Unstable angina ([place]) 04/09/2020   Upper GI bleed    Ventral hernia     Current Outpatient Medications on File Prior to Visit  Medication Sig Dispense Refill   albuterol (VENTOLIN HFA) 108 (90 Base) MCG/ACT inhaler INHALE 1-2 PUFFS INTO THE LUNGS EVERY 4 (FOUR) HOURS AS NEEDED FOR WHEEZING OR SHORTNESS OF BREATH. 18 g 0   amLODipine (NORVASC) 10 MG tablet TAKE 1 TABLET (10 MG TOTAL) BY MOUTH DAILY. 90 tablet 3   aspirin EC 81 MG EC tablet Take 1 tablet (81 mg total) by mouth daily.     carvedilol (COREG) 3.125 MG tablet Take 3.125 mg by mouth 2 (two) times daily with a meal.     clonazePAM (KLONOPIN) 0.5 MG tablet TAKE 1 TABLET BY MOUTH AT BEDTIME. 30 tablet 0   COVID-19 mRNA vaccine, Pfizer, 30 MCG/0.3ML injection USE AS DIRECTED .3 mL 0   cyclobenzaprine (FLEXERIL) 10 MG tablet TAKE 1 TABLET BY MOUTH 3 TIMES EVERY DAY AS NEEDED FOR MUSCLE SPASMS 60 tablet 0   cyclobenzaprine (FLEXERIL) 10 MG tablet TAKE 1 TABLET BY MOUTH 3 TIMES EVERY DAY AS NEEDED FOR MUSCLE SPASMS 60 tablet 0   fluticasone (FLONASE) 50 MCG/ACT nasal spray Place 2 sprays into both nostrils daily as needed for allergies. 16 g 11   gabapentin (NEURONTIN) 300 MG capsule TAKE 1 CAPSULE BY MOUTH 3 TIMES DAILY. 90 capsule 2   HYDROcodone-acetaminophen (NORCO) 10-325 MG tablet Take 1 tablet by mouth every 6 (six) hours as needed. 30 tablet 0   meloxicam (MOBIC) 7.5 MG tablet Take 7.5 mg by mouth 2 (two) times daily. (Patient not taking: No sig reported)     ondansetron (ZOFRAN) 4 MG tablet TAKE 1 TABLET BY MOUTH EVERY 8 HOURS AS NEEDED FOR NAUSEA OR VOMITING. 20 tablet 1   pantoprazole (PROTONIX) 40 MG tablet TAKE 1  TABLET (40 MG TOTAL) BY MOUTH 2 (TWO) TIMES DAILY. 180 tablet 2   rosuvastatin (CRESTOR) 20 MG tablet TAKE 1 TABLET (20 MG TOTAL) BY MOUTH DAILY. 90 tablet 3   Semaglutide-Weight Management 0.5 MG/0.5ML SOAJ INJECT 0.5 MG INTO THE SKIN ONCE A WEEK (Patient taking differently: Inject 1 mg into the skin once a week.) 2 mL  0   Semaglutide-Weight Management 1 MG/0.5ML SOAJ INJECT 1 MG INTO THE SKIN ONCE A WEEK FOR 28 DAYS (Patient not taking: Reported on 07/03/2020) 2 mL 0   Semaglutide-Weight Management 1.7 MG/0.75ML SOAJ INJECT 1.7 MG INTO THE SKIN ONCE A WEEK (Patient not taking: Reported on 07/03/2020) 3 mL 0   Semaglutide-Weight Management 2.4 MG/0.75ML SOAJ INJECT 2.4 MG INTO THE SKIN ONCE A WEEK. (Patient not taking: No sig reported) 3 mL 5   Topiramate ER (TROKENDI XR) 50 MG CP24 TAKE 1 CAPSULE (50 MG) BY MOUTH AT BEDTIME. 30 capsule 5   zolpidem (AMBIEN CR) 12.5 MG CR tablet TAKE 1 TABLET BY MOUTH AT BEDTIME. 30 tablet 0   [DISCONTINUED] lisinopril (ZESTRIL) 20 MG tablet TAKE 1 TABLET (20 MG TOTAL) BY MOUTH DAILY. 30 tablet 2   [DISCONTINUED] nortriptyline (PAMELOR) 25 MG capsule TAKE 1 CAPSULE BY MOUTH AT BEDTIME 30 capsule 0   [DISCONTINUED] simvastatin (ZOCOR) 10 MG tablet Take 1 tablet (10 mg total) by mouth daily. 90 tablet 2   No current facility-administered medications on file prior to visit.    Allergies  Allergen Reactions   Dilaudid [Hydromorphone Hcl] Rash and Other (See Comments)    Shakey   Hydromorphone Hcl Other (See Comments) and Rash    Shakey   Metoclopramide Other (See Comments), Rash, Swelling and Hives    Swelling in lips   Metoclopramide Hcl Hives, Swelling, Rash and Other (See Comments)    Swelling in lips   Nsaids     H/o bleeding gastric ulcers   Benadryl [Diphenhydramine]     Causes patient to become hyper and pace     Assessment/Plan:  1. Weight loss - Pt presented today for 3 month follow up after starting [facility]. He has lost almost 30 lbs so far! Down  11% of his baseline body weight, BMI has decreased from 35 to 31. Will complete last 2 injections of 1.7mg , then will increase to maintenance dose of Wegovy 2.4mg  once weekly. Overall tolerating well with some bloating and constipation, plans to resume Miralax as needed. Appetite has cut in half since starting [facility], portions much smaller now. Encouraged pt to continue with lifestyle improvements (he stopped midnight snacking and has been active with walking). Discussed recent A1c in diabetic range at last visit, anticipate this will normalize with Wegovy injections.  2. Hypertension - BP elevated above goal <130/60mmHg. Will resume lisinopril at 10mg  daily in the AM (previously took 20mg  daily, stopped 04/2020 secondary to soft BP after cath). He will continue amlodipine 10mg  in the PM and carvedilol 3.125mg  BID. Will recheck BMET in 1 month. Advised pt to call if BP remain elevated and can titrate lisinopril back up.  F/u in office in 3 months for 6 month follow up after starting [facility]. Will recheck A1c, BP, and lipids at that visit as well.  Adeyemi Hamad E. Keiston Manley, PharmD, BCACP, [place] [address] [place], [place] [address] Phone: [phone]; Fax: [phone] 08/01/2020 9:10 AM

## 2020-08-12 ENCOUNTER — Other Ambulatory Visit (HOSPITAL_COMMUNITY): Payer: Self-pay

## 2020-08-12 MED FILL — Semaglutide (Weight Mngmt) Soln Auto-Injector 2.4 MG/0.75ML: SUBCUTANEOUS | 28 days supply | Qty: 3 | Fill #0 | Status: AC

## 2020-08-23 ENCOUNTER — Other Ambulatory Visit: Payer: Self-pay | Admitting: Internal Medicine

## 2020-08-23 ENCOUNTER — Other Ambulatory Visit (HOSPITAL_COMMUNITY): Payer: Self-pay

## 2020-08-23 NOTE — Telephone Encounter (Signed)
Requested medication (s) are due for refill today: yes   Requested medication (s) are on the active medication list: yes   Last refill:  08/01/2020  Future visit scheduled: no  Notes to clinic:  this refill cannot be delegated    Requested Prescriptions  Pending Prescriptions Disp Refills   cyclobenzaprine (FLEXERIL) 10 MG tablet 60 tablet 0    Sig: TAKE 1 TABLET BY MOUTH 3 TIMES EVERY DAY AS NEEDED FOR MUSCLE SPASMS      Not Delegated - Analgesics:  Muscle Relaxants Failed - 08/23/2020  9:09 AM      Failed - This refill cannot be delegated      Passed - Valid encounter within last 6 months    Recent Outpatient Visits           2 months ago No-show for appointment   [facility], Coralie Keens, NP       Future Appointments             In 2 months [place], [facility]

## 2020-08-26 ENCOUNTER — Other Ambulatory Visit: Payer: Self-pay | Admitting: Internal Medicine

## 2020-08-26 ENCOUNTER — Other Ambulatory Visit (HOSPITAL_COMMUNITY): Payer: Self-pay

## 2020-08-26 NOTE — Telephone Encounter (Signed)
Requested medication (s) are due for refill today: yes  Requested medication (s) are on the active medication list: yes  Last refill:  08/01/2020  Future visit scheduled: no  Notes to clinic:  this refill cannot be delegated    Requested Prescriptions  Pending Prescriptions Disp Refills   cyclobenzaprine (FLEXERIL) 10 MG tablet 60 tablet 0    Sig: TAKE 1 TABLET BY MOUTH 3 TIMES EVERY DAY AS NEEDED FOR MUSCLE SPASMS      Not Delegated - Analgesics:  Muscle Relaxants Failed - 08/26/2020  8:18 AM      Failed - This refill cannot be delegated      Passed - Valid encounter within last 6 months    Recent Outpatient Visits           2 months ago No-show for appointment   [facility], Coralie Keens, NP       Future Appointments             In 2 months [place], [facility]

## 2020-08-26 NOTE — Telephone Encounter (Signed)
  Notes to clinic:  patient no showed appt on 06/06/2020 Review for refill    Requested Prescriptions  Pending Prescriptions Disp Refills   gabapentin (NEURONTIN) 300 MG capsule 90 capsule 2    Sig: TAKE 1 CAPSULE BY MOUTH 3 TIMES DAILY.      There is no refill protocol information for this order

## 2020-08-27 ENCOUNTER — Ambulatory Visit (INDEPENDENT_AMBULATORY_CARE_PROVIDER_SITE_OTHER): Payer: No Typology Code available for payment source | Admitting: Internal Medicine

## 2020-08-27 ENCOUNTER — Other Ambulatory Visit (HOSPITAL_COMMUNITY): Payer: Self-pay

## 2020-08-27 ENCOUNTER — Encounter: Payer: Self-pay | Admitting: Internal Medicine

## 2020-08-27 ENCOUNTER — Other Ambulatory Visit: Payer: Self-pay

## 2020-08-27 VITALS — BP 117/74 | HR 91 | Temp 97.7°F | Resp 17 | Ht 69.0 in | Wt 209.6 lb

## 2020-08-27 DIAGNOSIS — E7800 Pure hypercholesterolemia, unspecified: Secondary | ICD-10-CM | POA: Diagnosis not present

## 2020-08-27 DIAGNOSIS — F419 Anxiety disorder, unspecified: Secondary | ICD-10-CM

## 2020-08-27 DIAGNOSIS — I10 Essential (primary) hypertension: Secondary | ICD-10-CM

## 2020-08-27 DIAGNOSIS — Z6830 Body mass index (BMI) 30.0-30.9, adult: Secondary | ICD-10-CM

## 2020-08-27 DIAGNOSIS — I200 Unstable angina: Secondary | ICD-10-CM | POA: Diagnosis not present

## 2020-08-27 DIAGNOSIS — I6300 Cerebral infarction due to thrombosis of unspecified precerebral artery: Secondary | ICD-10-CM

## 2020-08-27 DIAGNOSIS — E6609 Other obesity due to excess calories: Secondary | ICD-10-CM

## 2020-08-27 DIAGNOSIS — F5101 Primary insomnia: Secondary | ICD-10-CM

## 2020-08-27 DIAGNOSIS — G5600 Carpal tunnel syndrome, unspecified upper limb: Secondary | ICD-10-CM | POA: Insufficient documentation

## 2020-08-27 DIAGNOSIS — E119 Type 2 diabetes mellitus without complications: Secondary | ICD-10-CM | POA: Diagnosis not present

## 2020-08-27 DIAGNOSIS — K2100 Gastro-esophageal reflux disease with esophagitis, without bleeding: Secondary | ICD-10-CM

## 2020-08-27 DIAGNOSIS — G5603 Carpal tunnel syndrome, bilateral upper limbs: Secondary | ICD-10-CM

## 2020-08-27 DIAGNOSIS — R519 Headache, unspecified: Secondary | ICD-10-CM

## 2020-08-27 MED ORDER — CYCLOBENZAPRINE HCL 10 MG PO TABS
10.0000 mg | ORAL_TABLET | Freq: Three times a day (TID) | ORAL | 0 refills | Status: DC | PRN
  Filled 2020-08-27: qty 60, 20d supply, fill #0

## 2020-08-27 MED ORDER — GABAPENTIN 300 MG PO CAPS
300.0000 mg | ORAL_CAPSULE | Freq: Two times a day (BID) | ORAL | 0 refills | Status: DC
  Filled 2020-08-27: qty 180, 90d supply, fill #0

## 2020-08-27 NOTE — Assessment & Plan Note (Signed)
Managed on carvedilol and rosuvastatin He follows with cardiology

## 2020-08-27 NOTE — Progress Notes (Signed)
Subjective:    Patient ID: [name], male    DOB: Oct 06, 1970, 50 y.o.   MRN: [id number]  HPI  Patient presents the clinic today for follow-up of chronic conditions.  HTN: His BP today is 117/74.  He is taking Amlodipine, Lisinopril and Carvedilol as prescribed.  ECG from 04/2020 reviewed.  HLD with Unstable Angina, History of Stroke: No residual effect.  His last LDL was 98, triglycerides 159.  He is taking Amlodipine, Lisinopril, Carvedilol and Rosuvastatin as prescribed.  He tries to consume a low-fat diet.  GERD: Triggered by tomato based and spicy foods.  He takes Pantoprazole as needed.  Upper GI from 09/2016 reviewed.  DM 2: His last A1c was 6.6%, 04/2020.  He is taking Wegovy as prescribed.  He does not check his sugars.  He checks his feet routinely.  His last eye exam was > 1 year ago.  Flu 9/20.  Pneumovax 04/2014.  [place] x3.  Anxiety: Chronic, managed on Clonazepam as needed.  He is not currently seeing a therapist.  He denies depression, SI/HI.  Insomnia: He has difficulty difficulty falling and staying asleep.  He is taking Ambien as prescribed.  Sleep study from 08/2014 reviewed.  Frequent Headaches: These occur multiple times per week, managed on Topamax.  He is currently seeing a neurologist.  Bilateral Carpal Tunnel: s/p surgery. He takes Flexeril, Gabapentin as needed with some relief of symptoms.  Review of Systems     Past Medical History:  Diagnosis Date   Anxiety    takes Xanax daily prn, panic attacks   Chronic gastric ulcer with bleeding s/p suture repair 2012 09/29/2010   Head 3 EGD which failed to stop the bleeding. Had exploratory laparotomy on 10/04/2010. Possible cause of the gastric ulcer was NSAID use.    Constipation    related to pain meds   Depression    Dizziness    occasionally   Dyspnea    walking a bit   Erosive esophagitis    External hemorrhoid, bleeding    Gastric ulcer    history of   GERD (gastroesophageal reflux disease)     takes Protonix bid   Hiatal hernia    History of blood clots 2012   in abdomen   History of blood transfusion 2012   Incisional hernia s/p open repair w mesh Aug 2013 05/26/2011   Joint pain    knees   Nocturia    depends on amount of fluid he drinks   Pneumonia 2018   Pre-diabetes    Primary localized osteoarthritis of left hip 04/24/2014   Serrated polyp of colon    Sleep apnea    no cpap  mild no cpap needed   Stroke [facility])    Unstable angina ([place]) 04/09/2020   Upper GI bleed    Ventral hernia     Current Outpatient Medications  Medication Sig Dispense Refill   albuterol (VENTOLIN HFA) 108 (90 Base) MCG/ACT inhaler INHALE 1-2 PUFFS INTO THE LUNGS EVERY 4 (FOUR) HOURS AS NEEDED FOR WHEEZING OR SHORTNESS OF BREATH. 18 g 0   amLODipine (NORVASC) 10 MG tablet TAKE 1 TABLET (10 MG TOTAL) BY MOUTH DAILY. 90 tablet 3   aspirin EC 81 MG EC tablet Take 1 tablet (81 mg total) by mouth daily.     carvedilol (COREG) 3.125 MG tablet Take 3.125 mg by mouth 2 (two) times daily with a meal.     clonazePAM (KLONOPIN) 0.5 MG tablet TAKE 1 TABLET BY  MOUTH AT BEDTIME. 30 tablet 0   COVID-19 mRNA vaccine, Pfizer, 30 MCG/0.3ML injection USE AS DIRECTED .3 mL 0   cyclobenzaprine (FLEXERIL) 10 MG tablet TAKE 1 TABLET BY MOUTH 3 TIMES EVERY DAY AS NEEDED FOR MUSCLE SPASMS 60 tablet 0   fluticasone (FLONASE) 50 MCG/ACT nasal spray Place 2 sprays into both nostrils daily as needed for allergies. 16 g 11   gabapentin (NEURONTIN) 300 MG capsule TAKE 1 CAPSULE BY MOUTH 3 TIMES DAILY. 90 capsule 2   HYDROcodone-acetaminophen (NORCO) 10-325 MG tablet Take 1 tablet by mouth every 6 (six) hours as needed. 30 tablet 0   lisinopril (ZESTRIL) 10 MG tablet Take 1 tablet (10 mg total) by mouth daily. 30 tablet 11   meloxicam (MOBIC) 7.5 MG tablet Take 7.5 mg by mouth 2 (two) times daily. (Patient not taking: No sig reported)     ondansetron (ZOFRAN) 4 MG tablet TAKE 1 TABLET BY MOUTH EVERY 8 HOURS AS NEEDED FOR NAUSEA  OR VOMITING. 20 tablet 1   pantoprazole (PROTONIX) 40 MG tablet TAKE 1 TABLET (40 MG TOTAL) BY MOUTH 2 (TWO) TIMES DAILY. 180 tablet 2   rosuvastatin (CRESTOR) 20 MG tablet TAKE 1 TABLET (20 MG TOTAL) BY MOUTH DAILY. 90 tablet 3   Semaglutide-Weight Management 1.7 MG/0.75ML SOAJ INJECT 1.7 MG INTO THE SKIN ONCE A WEEK (Patient not taking: Reported on 07/03/2020) 3 mL 0   Semaglutide-Weight Management 2.4 MG/0.75ML SOAJ INJECT 2.4 MG INTO THE SKIN ONCE A WEEK. (Patient not taking: No sig reported) 3 mL 5   Topiramate ER (TROKENDI XR) 50 MG CP24 TAKE 1 CAPSULE (50 MG) BY MOUTH AT BEDTIME. 30 capsule 5   zolpidem (AMBIEN CR) 12.5 MG CR tablet TAKE 1 TABLET BY MOUTH AT BEDTIME. 30 tablet 0   No current facility-administered medications for this visit.    Allergies  Allergen Reactions   Dilaudid [Hydromorphone Hcl] Rash and Other (See Comments)    Shakey   Hydromorphone Hcl Other (See Comments) and Rash    Shakey   Metoclopramide Other (See Comments), Rash, Swelling and Hives    Swelling in lips   Metoclopramide Hcl Hives, Swelling, Rash and Other (See Comments)    Swelling in lips   Nsaids     H/o bleeding gastric ulcers   Benadryl [Diphenhydramine]     Causes patient to become hyper and pace    Family History  Problem Relation Age of Onset   Diabetes Mother    Liver disease Mother    Diabetes Father    Heart disease Father    Healthy Sister    Healthy Brother    Healthy Brother    Colon cancer Neg Hx    Stomach cancer Neg Hx    Rectal cancer Neg Hx    Esophageal cancer Neg Hx    Colon polyps Neg Hx     Social History   Socioeconomic History   Marital status: Single    Spouse name: Not on file   Number of children: 1   Years of education: 12   Highest education level: High school graduate  Occupational History   Occupation: Maintenance - [employer]  Tobacco Use   Smoking status: Never   Smokeless tobacco: Current    Types: Chew   Tobacco comments:    occ-Chew   Vaping Use   Vaping Use: Never used  Substance and Sexual Activity   Alcohol use: Yes    Alcohol/week: 0.0 standard drinks    Comment: 09/04/11 "  very seldom"   Drug use: No   Sexual activity: Yes  Other Topics Concern   Not on file  Social History Narrative   Lives with brother in a one story home.  Has one daughter.  Works in Theatre manager for [employer].  Education: high school.       Patient is left-handed.   Social Determinants of Health   Financial Resource Strain: Not on file  Food Insecurity: Not on file  Transportation Needs: Not on file  Physical Activity: Not on file  Stress: Not on file  Social Connections: Not on file  Intimate Partner Violence: Not on file     Constitutional: Patient reports headaches.  Denies fever, malaise, fatigue, or abrupt weight changes.  HEENT: Denies eye pain, eye redness, ear pain, ringing in the ears, wax buildup, runny nose, nasal congestion, bloody nose, or sore throat. Respiratory: Denies difficulty breathing, shortness of breath, cough or sputum production.   Cardiovascular:  Denies chest pain, chest tightness, palpitations or swelling in the hands or feet.  Gastrointestinal: Pt reports intermittent reflux. Denies abdominal pain, bloating, constipation, diarrhea or blood in the stool.  GU: Denies urgency, frequency, pain with urination, burning sensation, blood in urine, odor or discharge. Musculoskeletal: Pt reports bilateral hand pain. Denies decrease in range of motion, difficulty with gait, muscle pain or joint swelling.  Skin: Denies redness, rashes, lesions or ulcercations.  Neurological: Patient reports insomnia.  Denies dizziness, difficulty with memory, difficulty with speech or problems with balance and coordination.  Psych: Patient has a history of anxiety.  Denies depression, SI/HI.  No other specific complaints in a complete review of systems (except as listed in HPI above).  Objective:   Physical Exam   BP 117/74 (BP  Location: Right Arm, Patient Position: Sitting, Cuff Size: Normal)   Pulse 91   Temp 97.7 F (36.5 C) (Temporal)   Resp 17   Ht '5\' 9"'$  (1.753 m)   Wt 209 lb 9.6 oz (95.1 kg)   SpO2 100%   BMI 30.95 kg/m   Wt Readings from Last 3 Encounters:  08/01/20 213 lb (96.6 kg)  07/03/20 216 lb (98 kg)  05/01/20 239 lb 3.2 oz (108.5 kg)    General: Appears their stated age, obese, in NAD. Skin: Warm, dry and intact. No ulcerations noted. HEENT: Head: normal shape and size; Eyes: sclera white and EOMs intact;  Cardiovascular: Normal rate and rhythm. S1,S2 noted.  No murmur, rubs or gallops noted. No JVD or BLE edema. No carotid bruits noted. Pulmonary/Chest: Normal effort and positive vesicular breath sounds. No respiratory distress. No wheezes, rales or ronchi noted.  Musculoskeletal: No difficulty with gait.  Neurological: Alert and oriented.  Psychiatric: Mood and affect normal. Behavior is normal. Judgment and thought content normal.   BMET    Component Value Date/Time   NA 142 04/26/2020 0849   K 4.6 04/26/2020 0849   CL 106 04/26/2020 0849   CO2 20 04/26/2020 0849   GLUCOSE 129 (H) 04/26/2020 0849   GLUCOSE 110 (H) 04/11/2020 0320   BUN 18 04/26/2020 0849   CREATININE 1.16 04/26/2020 0849   CALCIUM 9.7 04/26/2020 0849   GFRNONAA >60 04/11/2020 0320   GFRAA 96 12/12/2019 1134    Lipid Panel     Component Value Date/Time   CHOL 184 04/10/2020 0402   TRIG 159 (H) 04/10/2020 0402   HDL 54 04/10/2020 0402   CHOLHDL 3.4 04/10/2020 0402   VLDL 32 04/10/2020 0402   LDLCALC 98 04/10/2020  0402    CBC    Component Value Date/Time   WBC 8.2 04/11/2020 0320   RBC 4.43 04/11/2020 0320   HGB 13.8 04/11/2020 0320   HCT 40.5 04/11/2020 0320   PLT 256 04/11/2020 0320   MCV 91.4 04/11/2020 0320   MCH 31.2 04/11/2020 0320   MCHC 34.1 04/11/2020 0320   RDW 13.2 04/11/2020 0320   LYMPHSABS 2.4 04/09/2020 1553   MONOABS 1.0 04/09/2020 1553   EOSABS 0.2 04/09/2020 1553   BASOSABS  0.1 04/09/2020 1553    Hgb A1C Lab Results  Component Value Date   HGBA1C 6.6 (H) 05/01/2020           Assessment & Plan:     Webb Silversmith, NP This visit occurred during the SARS-CoV-2 public health emergency.  Safety protocols were in place, including screening questions prior to the visit, additional usage of staff PPE, and extensive cleaning of exam room while observing appropriate contact time as indicated for disinfecting solutions.

## 2020-08-27 NOTE — Assessment & Plan Note (Signed)
Continue Ambien as needed 

## 2020-08-27 NOTE — Patient Instructions (Signed)
https://www.diabeteseducator.org/docs/default-source/living-with-diabetes/conquering-the-grocery-store-v1.pdf?sfvrsn=4">  Carbohydrate Counting for Diabetes Mellitus, Adult Carbohydrate counting is a method of keeping track of how many carbohydrates you eat. Eating carbohydrates naturally increases the amount of sugar (glucose) in the blood. Counting how many carbohydrates you eat improves your bloodglucose control, which helps you manage your diabetes. It is important to know how many carbohydrates you can safely have in each meal. This is different for every person. A dietitian can help you make a meal plan and calculate how many carbohydrates you should have at each meal andsnack. What foods contain carbohydrates? Carbohydrates are found in the following foods: Grains, such as breads and cereals. Dried beans and soy products. Starchy vegetables, such as potatoes, peas, and corn. Fruit and fruit juices. Milk and yogurt. Sweets and snack foods, such as cake, cookies, candy, chips, and soft drinks. How do I count carbohydrates in foods? There are two ways to count carbohydrates in food. You can read food labels or learn standard serving sizes of foods. You can use either of the methods or acombination of both. Using the Nutrition Facts label The Nutrition Facts list is included on the labels of almost all packaged foods and beverages in the [place] It includes: The serving size. Information about nutrients in each serving, including the grams (g) of carbohydrate per serving. To use the Nutrition Facts: Decide how many servings you will have. Multiply the number of servings by the number of carbohydrates per serving. The resulting number is the total amount of carbohydrates that you will be having. Learning the standard serving sizes of foods When you eat carbohydrate foods that are not packaged or do not include Nutrition Facts on the label, you need to measure the servings in order to count the  amount of carbohydrates. Measure the foods that you will eat with a food scale or measuring cup, if needed. Decide how many standard-size servings you will eat. Multiply the number of servings by 15. For foods that contain carbohydrates, one serving equals 15 g of carbohydrates. For example, if you eat 2 cups or 10 oz (300 g) of strawberries, you will have eaten 2 servings and 30 g of carbohydrates (2 servings x 15 g = 30 g). For foods that have more than one food mixed, such as soups and casseroles, you must count the carbohydrates in each food that is included. The following list contains standard serving sizes of common carbohydrate-rich foods. Each of these servings has about 15 g of carbohydrates: 1 slice of bread. 1 six-inch (15 cm) tortilla. ? cup or 2 oz (53 g) cooked rice or pasta.  cup or 3 oz (85 g) cooked or canned, drained and rinsed beans or lentils.  cup or 3 oz (85 g) starchy vegetable, such as peas, corn, or squash.  cup or 4 oz (120 g) hot cereal.  cup or 3 oz (85 g) boiled or mashed potatoes, or  or 3 oz (85 g) of a large baked potato.  cup or 4 fl oz (118 mL) fruit juice. 1 cup or 8 fl oz (237 mL) milk. 1 small or 4 oz (106 g) apple.  or 2 oz (63 g) of a medium banana. 1 cup or 5 oz (150 g) strawberries. 3 cups or 1 oz (24 g) popped popcorn. What is an example of carbohydrate counting? To calculate the number of carbohydrates in this sample meal, follow the stepsshown below. Sample meal 3 oz (85 g) chicken breast. ? cup or 4 oz (106 g) brown rice.    cup or 3 oz (85 g) corn. 1 cup or 8 fl oz (237 mL) milk. 1 cup or 5 oz (150 g) strawberries with sugar-free whipped topping. Carbohydrate calculation Identify the foods that contain carbohydrates: Rice. Corn. Milk. Strawberries. Calculate how many servings you have of each food: 2 servings rice. 1 serving corn. 1 serving milk. 1 serving strawberries. Multiply each number of servings by 15 g: 2 servings  rice x 15 g = 30 g. 1 serving corn x 15 g = 15 g. 1 serving milk x 15 g = 15 g. 1 serving strawberries x 15 g = 15 g. Add together all of the amounts to find the total grams of carbohydrates eaten: 30 g + 15 g + 15 g + 15 g = 75 g of carbohydrates total. What are tips for following this plan? Shopping Develop a meal plan and then make a shopping list. Buy fresh and frozen vegetables, fresh and frozen fruit, dairy, eggs, beans, lentils, and whole grains. Look at food labels. Choose foods that have more fiber and less sugar. Avoid processed foods and foods with added sugars. Meal planning Aim to have the same amount of carbohydrates at each meal and for each snack time. Plan to have regular, balanced meals and snacks. Where to find more information American Diabetes Association: www.diabetes.org Centers for Disease Control and Prevention: www.cdc.gov Summary Carbohydrate counting is a method of keeping track of how many carbohydrates you eat. Eating carbohydrates naturally increases the amount of sugar (glucose) in the blood. Counting how many carbohydrates you eat improves your blood glucose control, which helps you manage your diabetes. A dietitian can help you make a meal plan and calculate how many carbohydrates you should have at each meal and snack. This information is not intended to replace advice given to you by your health care provider. Make sure you discuss any questions you have with your healthcare provider. Document Revised: 01/19/2019 Document Reviewed: 01/20/2019 Elsevier Patient Education  2021 Elsevier Inc.  

## 2020-08-27 NOTE — Assessment & Plan Note (Signed)
C-Met and lipid profile today Encouraged him to consume a low-fat diet Continue rosuvastatin 

## 2020-08-27 NOTE — Assessment & Plan Note (Signed)
Encouraged him to avoid foods that trigger his reflux Encouraged weight loss as this can help reduce reflux symptoms Continue pantoprazole as needed

## 2020-08-27 NOTE — Assessment & Plan Note (Signed)
Controlled on amlodipine, lisinopril and carvedilol Reinforced DASH diet and exercise for weight loss C-Met today

## 2020-08-27 NOTE — Assessment & Plan Note (Signed)
A1c today No urine microalbumin secondary to ACEI therapy Encouraged him to consume a low-carb diet and exercise weight loss Continue [id number] Encourage routine eye exams Encouraged routine foot exams

## 2020-08-27 NOTE — Assessment & Plan Note (Signed)
Managed on Topamax He will continue to see neurology, will follow

## 2020-08-27 NOTE — Assessment & Plan Note (Signed)
Continue clonazepam as needed Support offered 

## 2020-08-27 NOTE — Assessment & Plan Note (Signed)
Encourage diet and exercise for weight loss 

## 2020-08-27 NOTE — Assessment & Plan Note (Signed)
Continue Flexeril Will wean gabapentin

## 2020-08-27 NOTE — Assessment & Plan Note (Signed)
No residual effect Continue amlodipine, lisinopril, carvedilol and rosuvastatin

## 2020-08-28 LAB — COMPLETE METABOLIC PANEL WITH GFR
AG Ratio: 1.9 (calc) (ref 1.0–2.5)
ALT: 28 U/L (ref 9–46)
AST: 25 U/L (ref 10–35)
Albumin: 4.6 g/dL (ref 3.6–5.1)
Alkaline phosphatase (APISO): 58 U/L (ref 35–144)
BUN: 17 mg/dL (ref 7–25)
CO2: 24 mmol/L (ref 20–32)
Calcium: 10.2 mg/dL (ref 8.6–10.3)
Chloride: 105 mmol/L (ref 98–110)
Creat: 1.16 mg/dL (ref 0.70–1.30)
Globulin: 2.4 g/dL (calc) (ref 1.9–3.7)
Glucose, Bld: 83 mg/dL (ref 65–139)
Potassium: 4.7 mmol/L (ref 3.5–5.3)
Sodium: 139 mmol/L (ref 135–146)
Total Bilirubin: 0.5 mg/dL (ref 0.2–1.2)
Total Protein: 7 g/dL (ref 6.1–8.1)
eGFR: 77 mL/min/{1.73_m2} (ref >=60)

## 2020-08-28 LAB — LIPID PANEL
Cholesterol: 99 mg/dL (ref <200)
HDL: 58 mg/dL (ref >=40)
LDL Cholesterol (Calc): 25 mg/dL (calc)
Non-HDL Cholesterol (Calc): 41 mg/dL (calc) (ref <130)
Total CHOL/HDL Ratio: 1.7 (calc) (ref <5.0)
Triglycerides: 76 mg/dL (ref <150)

## 2020-08-28 LAB — HEMOGLOBIN A1C
Hgb A1c MFr Bld: 5.8 % of total Hgb — ABNORMAL HIGH (ref <5.7)
Mean Plasma Glucose: 120 mg/dL
eAG (mmol/L): 6.6 mmol/L

## 2020-08-29 ENCOUNTER — Other Ambulatory Visit: Payer: Self-pay

## 2020-08-29 ENCOUNTER — Other Ambulatory Visit: Payer: No Typology Code available for payment source

## 2020-08-30 ENCOUNTER — Other Ambulatory Visit (HOSPITAL_COMMUNITY): Payer: Self-pay

## 2020-08-30 ENCOUNTER — Other Ambulatory Visit: Payer: Self-pay | Admitting: Internal Medicine

## 2020-08-30 MED ORDER — ZOLPIDEM TARTRATE ER 12.5 MG PO TBCR
12.5000 mg | EXTENDED_RELEASE_TABLET | Freq: Every day | ORAL | 0 refills | Status: DC
  Filled 2020-08-30: qty 30, 30d supply, fill #0

## 2020-08-30 MED ORDER — ONDANSETRON HCL 4 MG PO TABS
4.0000 mg | ORAL_TABLET | Freq: Three times a day (TID) | ORAL | 1 refills | Status: DC | PRN
  Filled 2020-08-30: qty 20, 7d supply, fill #0
  Filled 2020-10-28: qty 20, 7d supply, fill #1

## 2020-08-30 MED ORDER — CLONAZEPAM 0.5 MG PO TABS
0.5000 mg | ORAL_TABLET | Freq: Every day | ORAL | 0 refills | Status: DC
  Filled 2020-08-30: qty 30, 30d supply, fill #0

## 2020-08-30 NOTE — Telephone Encounter (Signed)
Requested medication (s) are due for refill today: yes   Requested medication (s) are on the active medication list: yes   Last refill:  08/01/2020  Future visit scheduled: no  Notes to clinic:  this refill cannot be delegated    Requested Prescriptions  Pending Prescriptions Disp Refills   zolpidem (AMBIEN CR) 12.5 MG CR tablet 30 tablet 0    Sig: TAKE 1 TABLET BY MOUTH AT BEDTIME.      Not Delegated - Psychiatry:  Anxiolytics/Hypnotics Failed - 08/30/2020  8:45 AM      Failed - This refill cannot be delegated      Failed - Urine Drug Screen completed in last 360 days      Passed - Valid encounter within last 6 months    Recent Outpatient Visits           3 days ago Type 2 diabetes mellitus without complication, without long-term current use of insulin ([place])   [facility] [place], Coralie Keens, NP   2 months ago No-show for appointment   [facility] [place], Coralie Keens, NP       Future Appointments             In 2 months [place], [facility]

## 2020-08-30 NOTE — Telephone Encounter (Signed)
Requested medication (s) are due for refill today: yes   Requested medication (s) are on the active medication list: yes   Last refill:  08/01/2020  Notes to clinic:  this refill cannot be delegated    Requested Prescriptions  Pending Prescriptions Disp Refills   ondansetron (ZOFRAN) 4 MG tablet 20 tablet 1    Sig: TAKE 1 TABLET BY MOUTH EVERY 8 HOURS AS NEEDED FOR NAUSEA OR VOMITING.      There is no refill protocol information for this order      clonazePAM (KLONOPIN) 0.5 MG tablet 30 tablet 0    Sig: TAKE 1 TABLET BY MOUTH AT BEDTIME.      There is no refill protocol information for this order

## 2020-09-03 ENCOUNTER — Institutional Professional Consult (permissible substitution): Payer: No Typology Code available for payment source | Admitting: Internal Medicine

## 2020-09-13 ENCOUNTER — Other Ambulatory Visit: Payer: Self-pay

## 2020-09-13 ENCOUNTER — Other Ambulatory Visit (HOSPITAL_COMMUNITY): Payer: Self-pay

## 2020-09-13 MED ORDER — TROKENDI XR 50 MG PO CP24
50.0000 mg | ORAL_CAPSULE | Freq: Every day | ORAL | 5 refills | Status: DC
  Filled 2020-09-13: qty 30, 30d supply, fill #0
  Filled 2020-10-11: qty 30, 30d supply, fill #1
  Filled 2020-11-13: qty 30, 30d supply, fill #2
  Filled 2021-01-07: qty 30, 30d supply, fill #3
  Filled 2021-02-06: qty 30, 30d supply, fill #4
  Filled 2021-03-25: qty 30, 30d supply, fill #5

## 2020-09-13 MED FILL — Semaglutide (Weight Mngmt) Soln Auto-Injector 2.4 MG/0.75ML: SUBCUTANEOUS | 28 days supply | Qty: 3 | Fill #1 | Status: CN

## 2020-09-13 NOTE — Telephone Encounter (Signed)
Pt to have his refills sent to [place], [facility] closed.

## 2020-09-19 ENCOUNTER — Other Ambulatory Visit (HOSPITAL_COMMUNITY): Payer: Self-pay

## 2020-09-19 ENCOUNTER — Other Ambulatory Visit: Payer: Self-pay | Admitting: Pharmacist

## 2020-09-19 MED ORDER — SEMAGLUTIDE-WEIGHT MANAGEMENT 2.4 MG/0.75ML ~~LOC~~ SOAJ
2.4000 mg | SUBCUTANEOUS | 11 refills | Status: DC
  Filled 2020-09-19: qty 3, 28d supply, fill #0
  Filled 2020-10-16: qty 3, 28d supply, fill #1
  Filled 2020-11-13: qty 3, 28d supply, fill #2
  Filled 2020-12-11: qty 3, 28d supply, fill #3
  Filled 2021-01-07: qty 3, 28d supply, fill #4
  Filled 2021-02-05: qty 3, 28d supply, fill #5
  Filled 2021-02-25 – 2021-02-27 (×2): qty 3, 28d supply, fill #6
  Filled 2021-03-25: qty 3, 28d supply, fill #7
  Filled 2021-04-22: qty 3, 28d supply, fill #8
  Filled 2021-05-20: qty 3, 28d supply, fill #9
  Filled 2021-06-12: qty 3, 28d supply, fill #10
  Filled 2021-07-11: qty 3, 28d supply, fill #11

## 2020-09-27 ENCOUNTER — Other Ambulatory Visit: Payer: Self-pay | Admitting: Internal Medicine

## 2020-09-27 ENCOUNTER — Other Ambulatory Visit (HOSPITAL_COMMUNITY): Payer: Self-pay

## 2020-09-27 MED ORDER — ZOLPIDEM TARTRATE ER 12.5 MG PO TBCR
12.5000 mg | EXTENDED_RELEASE_TABLET | Freq: Every day | ORAL | 0 refills | Status: DC
  Filled 2020-09-27: qty 30, 30d supply, fill #0

## 2020-09-27 MED ORDER — CLONAZEPAM 0.5 MG PO TABS
0.5000 mg | ORAL_TABLET | Freq: Every day | ORAL | 0 refills | Status: DC
  Filled 2020-09-27 – 2020-09-30 (×2): qty 30, 30d supply, fill #0

## 2020-09-27 MED ORDER — CYCLOBENZAPRINE HCL 10 MG PO TABS
10.0000 mg | ORAL_TABLET | Freq: Three times a day (TID) | ORAL | 0 refills | Status: DC | PRN
  Filled 2020-09-27: qty 60, 20d supply, fill #0

## 2020-09-27 NOTE — Telephone Encounter (Signed)
Requested medication (s) are due for refill today: yes  Requested medication (s) are on the active medication list: yes  Last refill:  08/30/20 #30 0 refills  Future visit scheduled: no  Notes to clinic:  not delegated per protocol     Requested Prescriptions  Pending Prescriptions Disp Refills   clonazePAM (KLONOPIN) 0.5 MG tablet 30 tablet 0    Sig: Take 1 tablet (0.5 mg total) by mouth at bedtime.     Not Delegated - Psychiatry:  Anxiolytics/Hypnotics Failed - 09/27/2020 10:34 AM      Failed - This refill cannot be delegated      Failed - Urine Drug Screen completed in last 360 days      Passed - Valid encounter within last 6 months    Recent Outpatient Visits           1 month ago Type 2 diabetes mellitus without complication, without long-term current use of insulin ([place])   [facility] [place], Coralie Keens, NP   3 months ago No-show for appointment   [facility] [place], Coralie Keens, NP       Future Appointments             In 1 month [place], [facility]

## 2020-09-27 NOTE — Telephone Encounter (Signed)
Requested medication (s) are due for refill today: yes  Requested medication (s) are on the active medication list: yes  Last refill:  flexeril- 08/27/20 #60 0 refills , ambien- 08/30/20 #30 0 refills  Future visit scheduled: no  Notes to clinic:  not delegated per protocol, [facility] - [facility]     Requested Prescriptions  Pending Prescriptions Disp Refills   cyclobenzaprine (FLEXERIL) 10 MG tablet 60 tablet 0    Sig: Take 1 tablet (10 mg total) by mouth 3 (three) times daily as needed for muscle spasms     Not Delegated - Analgesics:  Muscle Relaxants Failed - 09/27/2020  9:49 AM      Failed - This refill cannot be delegated      Passed - Valid encounter within last 6 months    Recent Outpatient Visits           1 month ago Type 2 diabetes mellitus without complication, without long-term current use of insulin ([place])   [facility] [place], Coralie Keens, NP   3 months ago No-show for appointment   [facility] [place], Coralie Keens, NP       Future Appointments             In 1 month [place] [facility], [facility]              zolpidem (AMBIEN CR) 12.5 MG CR tablet 30 tablet 0    Sig: Take 1 tablet (12.5 mg total) by mouth at bedtime.     Not Delegated - Psychiatry:  Anxiolytics/Hypnotics Failed - 09/27/2020  9:49 AM      Failed - This refill cannot be delegated      Failed - Urine Drug Screen completed in last 360 days      Passed - Valid encounter within last 6 months    Recent Outpatient Visits           1 month ago Type 2 diabetes mellitus without complication, without long-term current use of insulin ([place])   [facility] [place], Coralie Keens, NP   3 months ago No-show for appointment   [facility], Coralie Keens, NP       Future Appointments             In 1 month [place], [facility]

## 2020-09-30 ENCOUNTER — Other Ambulatory Visit (HOSPITAL_COMMUNITY): Payer: Self-pay

## 2020-10-02 ENCOUNTER — Other Ambulatory Visit (HOSPITAL_COMMUNITY): Payer: Self-pay

## 2020-10-11 ENCOUNTER — Other Ambulatory Visit (HOSPITAL_COMMUNITY): Payer: Self-pay

## 2020-10-16 ENCOUNTER — Other Ambulatory Visit (HOSPITAL_COMMUNITY): Payer: Self-pay

## 2020-10-28 ENCOUNTER — Other Ambulatory Visit (HOSPITAL_COMMUNITY): Payer: Self-pay

## 2020-10-28 ENCOUNTER — Other Ambulatory Visit: Payer: Self-pay | Admitting: Internal Medicine

## 2020-10-28 NOTE — Progress Notes (Signed)
Patient ID: ZARIN KNUPP                 DOB: 28-Jan-1971                    MRN: [id number]     HPI: [name] is a 50 y.o. male patient referred to pharmacy clinic by Dr. Johney Frame to initiate weight loss therapy with GLP1-RA. 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 Pt seen by Dr Johney Frame 04/26/20 and reported working on exercising and eating better to lose weight with subsequent 8 lb weight loss. He was seen in PharmD clinic on 3/30 and started on Wegovy 0.25mg  once weekly injections. Copay $25 with his insurance, prior auth approved through 10/30/20, no hx of thyroid cancer or pancreatitis. Baseline A1c was checked and pt had moved from prediabetic into diabetic range with A1c of 6.6%.  At last visit on 6/30, patient was doing well on Wegovy 1.7 mg weekly and had lost 30 lbs so far. He was experiencing some side effects - mainly notices bloating and constipation. BP was elevated 138/100 and lisinopril 10 mg was resumed. BMET one month later at PCP visit was stable with kidney/electrolytes wnl. BP at that time was at goal.    Today, patient reports doing well on Wegovy and continues to lose weight. He has been working hard on healthy lifestyle habits and knows that this has also helped him to lose weight. He feels that eating earlier in the day and not waking up at night to eat has been really significant for him. Still experiencing some constipation. Reports he needs to be better about taking Miralax before he gets too constipated. Notes that he feels like his skin is burning and feels almost like a sunburn on his legs and arms though there is no visible burn or rash. States this started in the last 1-2 months, which would correlate with dose increase to 2.4 mg. He hasn't started any new medications or products at home. 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 One of his coworkers who is also on Wegovy has also been experiencing this so unclear if it could be from the medication or from exposure to a new product at work. He had lab work at his last PCP visit in July (4 months after starting Wegovy) which showed LDL has improved from 98 to 25, triglycerides from 159 to 76, and A1c from 6.6 to 5.8, with no changes in medications that could have contributed to this. No missed doses of Wegovy. After he resumed lisinopril in June, reports he had some dizziness and lightheadedness that he attributed to low BP. This has resolved and not happened recently.   Current weight management medications: Wegovy 2.4 mg once weekly    Previously tried meds: None   Current meds that may affect weight: Topiramate (may decrease weight)   Baseline weight/BMI: 239.2 lb (BMI 35.32) 3 month weight/BMI: 213 lb (BMI 31.5) 6 month weight/BMI: 199.4 lb (BMI 29.45) =16.6% weight loss to date   Insurance payor: [employer]   Diet: -Breakfast: Skips sometimes, protein shakes otherwise -Lunch: varies -Dinner: ground [place], chicken, stays away from fast food, cooking more at home now, finishes eating earlier in the day -Snacks: stopped snacking in the middle of  the night -Drinks: water, sugar free Propel, Koolaid sugar free   Exercise: Walks a lot at work, sit ups, Metallurgist, lifting weights   Family History: Diabetes in his father and mother; Healthy in his brother, brother, and sister; Heart disease in his father; Liver disease in his mother   Social History: Never smoker  Labs: Lab Results  Component Value Date   HGBA1C 5.8 (H) 08/27/2020    Wt Readings from Last 1 Encounters:  10/29/20 199 lb 6.4 oz (90.4 kg)    BP Readings from Last 1 Encounters:  10/29/20 132/82   Pulse Readings from Last 1 Encounters:  10/29/20 93       Component Value Date/Time   CHOL 99  08/27/2020 1345   TRIG 76 08/27/2020 1345   HDL 58 08/27/2020 1345   CHOLHDL 1.7 08/27/2020 1345   VLDL 32 04/10/2020 0402   LDLCALC 25 08/27/2020 1345    Past Medical History:  Diagnosis Date   Anxiety    takes Xanax daily prn, panic attacks   Chronic gastric ulcer with bleeding s/p suture repair 2012 09/29/2010   Head 3 EGD which failed to stop the bleeding. Had exploratory laparotomy on 10/04/2010. Possible cause of the gastric ulcer was NSAID use.    Constipation    related to pain meds   Depression    Dizziness    occasionally   Dyspnea    walking a bit   Erosive esophagitis    External hemorrhoid, bleeding    Gastric ulcer    history of   GERD (gastroesophageal reflux disease)    takes Protonix bid   Hiatal hernia    History of blood clots 2012   in abdomen   History of blood transfusion 2012   Incisional hernia s/p open repair w mesh Aug 2013 05/26/2011   Joint pain    knees   Nocturia    depends on amount of fluid he drinks   Pneumonia 2018   Pre-diabetes    Primary localized osteoarthritis of left hip 04/24/2014   Serrated polyp of colon    Sleep apnea    no cpap  mild no cpap needed   Stroke [facility])    Unstable angina ([place]) 04/09/2020   Upper GI bleed    Ventral hernia     Current Outpatient Medications on File Prior to Visit  Medication Sig Dispense Refill   albuterol (VENTOLIN HFA) 108 (90 Base) MCG/ACT inhaler INHALE 1-2 PUFFS INTO THE LUNGS EVERY 4 (FOUR) HOURS AS NEEDED FOR WHEEZING OR SHORTNESS OF BREATH. 18 g 0   amLODipine (NORVASC) 10 MG tablet TAKE 1 TABLET (10 MG TOTAL) BY MOUTH DAILY. 90 tablet 3   aspirin EC 81 MG EC tablet Take 1 tablet (81 mg total) by mouth daily.     carvedilol (COREG) 3.125 MG tablet Take 3.125 mg by mouth 2 (two) times daily with a meal.     clonazePAM (KLONOPIN) 0.5 MG tablet Take 1 tablet (0.5 mg total) by mouth at bedtime. 30 tablet 0   COVID-19 mRNA vaccine, Pfizer, 30 MCG/0.3ML injection USE AS DIRECTED .3 mL 0    cyclobenzaprine (FLEXERIL) 10 MG tablet Take 1 tablet (10 mg total) by mouth 3 (three) times daily as needed for muscle spasms 60 tablet 0   fluticasone (FLONASE) 50 MCG/ACT nasal spray Place 2 sprays into both nostrils daily as needed for allergies. 16 g 11   gabapentin (NEURONTIN) 300 MG capsule Take 1 capsule (300 mg total) by mouth 2 (  two) times daily. 180 capsule 0   lisinopril (ZESTRIL) 10 MG tablet Take 1 tablet (10 mg total) by mouth daily. 30 tablet 11   ondansetron (ZOFRAN) 4 MG tablet TAKE 1 TABLET BY MOUTH EVERY 8 HOURS AS NEEDED FOR NAUSEA OR VOMITING. 20 tablet 1   pantoprazole (PROTONIX) 40 MG tablet TAKE 1 TABLET (40 MG TOTAL) BY MOUTH 2 (TWO) TIMES DAILY. 180 tablet 2   rosuvastatin (CRESTOR) 20 MG tablet TAKE 1 TABLET (20 MG TOTAL) BY MOUTH DAILY. 90 tablet 3   Semaglutide-Weight Management 2.4 MG/0.75ML SOAJ Inject 2.4 mg into the skin once a week. 3 mL 11   Topiramate ER (TROKENDI XR) 50 MG CP24 Take 1 capsule (50 mg) by mouth at bedtime. 30 capsule 5   zolpidem (AMBIEN CR) 12.5 MG CR tablet Take 1 tablet (12.5 mg total) by mouth at bedtime. 30 tablet 0   [DISCONTINUED] nortriptyline (PAMELOR) 25 MG capsule TAKE 1 CAPSULE BY MOUTH AT BEDTIME 30 capsule 0   [DISCONTINUED] simvastatin (ZOCOR) 10 MG tablet Take 1 tablet (10 mg total) by mouth daily. 90 tablet 2   No current facility-administered medications on file prior to visit.    Allergies  Allergen Reactions   Dilaudid [Hydromorphone Hcl] Rash and Other (See Comments)    Shakey   Hydromorphone Hcl Other (See Comments) and Rash    Shakey   Metoclopramide Other (See Comments), Rash, Swelling and Hives    Swelling in lips   Metoclopramide Hcl Hives, Swelling, Rash and Other (See Comments)    Swelling in lips   Nsaids     H/o bleeding gastric ulcers   Benadryl [Diphenhydramine]     Causes patient to become hyper and pace     Assessment/Plan:  1. Weight loss - Pt presented today for 6 month follow up after starting  [facility] in March. He has lost 40 lbs so far! Down 16.6% of his baseline body weight, BMI has decreased from 35 to 29 and is no longer in the category of obesity. A1c has improved to prediabetic range after only 4 months of treatment and LDL and TG have improved dramatically to 25 and 76, respectively. Will recheck A1c and lipid panel in 1 month (3 months from last check) to see the impact of >6 months of treatment with [facility]. Expect he will likely be in the normal A1c range now. Regarding the burning he is feeling on his skin, instructed him to check to see if his work is using a new detergent and if not to let us know if he would like to decrease down to the 1.7 mg weekly dose to see if it gets any better. He is hesitant to decrease the dose and would like to stay on 2.4 mg for now since he has been seeing such great results. Encouraged him to call if he ever changes his mind and would like [place] to send in 1.7 mg dose. Encouraged him to keep up the great work with his physical activity and healthy eating habits.   His PA was initially approved through 10/30/20. The renewal has been approved today through 10/28/21.   2. Hypertension - BP nearly at goal <130/80 mmHg after resuming lisinopril 10 mg daily at visit in June. BP was normal at PCP visit in July and BMET stable at that time. Due to reports of dizziness/lightheadedness after resuming lisinopril that has now resolved, will continue current dose of lisinopril rather than titrate dose up. Instructed him to call if he  experiences dizziness or lightheadedness again so we can decrease his dose. As he continues to lose weight he may not require as much BP lowering with medications as before.    Rebbeca Paul, PharmD PGY2 Ambulatory Care Pharmacy Resident 10/29/2020 9:21 AM

## 2020-10-28 NOTE — Telephone Encounter (Signed)
Requested medication (s) are due for refill today: yes  Requested medication (s) are on the active medication list: yes  Last refill:  flexeril- 09/27/20 #60 0 refills , ambien - 09/27/20 #30 0 refills   Future visit scheduled: no   Notes to clinic:  not delegated per protocol     Requested Prescriptions  Pending Prescriptions Disp Refills   cyclobenzaprine (FLEXERIL) 10 MG tablet 60 tablet 0    Sig: Take 1 tablet (10 mg total) by mouth 3 (three) times daily as needed for muscle spasms     Not Delegated - Analgesics:  Muscle Relaxants Failed - 10/28/2020 10:33 AM      Failed - This refill cannot be delegated      Passed - Valid encounter within last 6 months    Recent Outpatient Visits           2 months ago Type 2 diabetes mellitus without complication, without long-term current use of insulin ([place])   [facility] [place], Coralie Keens, NP   4 months ago No-show for appointment   [facility] [place], Coralie Keens, NP       Future Appointments             Tomorrow [facility], [facility]              zolpidem (AMBIEN CR) 12.5 MG CR tablet 30 tablet 0    Sig: Take 1 tablet (12.5 mg total) by mouth at bedtime.     Not Delegated - Psychiatry:  Anxiolytics/Hypnotics Failed - 10/28/2020 10:33 AM      Failed - This refill cannot be delegated      Failed - Urine Drug Screen completed in last 360 days      Passed - Valid encounter within last 6 months    Recent Outpatient Visits           2 months ago Type 2 diabetes mellitus without complication, without long-term current use of insulin ([place])   [facility] [place], Coralie Keens, NP   4 months ago No-show for appointment   [facility], Coralie Keens, NP       Future Appointments             Tomorrow [place] Office, [facility]

## 2020-10-29 ENCOUNTER — Other Ambulatory Visit (HOSPITAL_COMMUNITY): Payer: Self-pay

## 2020-10-29 ENCOUNTER — Other Ambulatory Visit: Payer: Self-pay

## 2020-10-29 ENCOUNTER — Ambulatory Visit: Payer: No Typology Code available for payment source | Admitting: Student-PharmD

## 2020-10-29 VITALS — BP 132/82 | HR 93 | Wt 199.4 lb

## 2020-10-29 DIAGNOSIS — E119 Type 2 diabetes mellitus without complications: Secondary | ICD-10-CM | POA: Diagnosis not present

## 2020-10-29 DIAGNOSIS — Z6830 Body mass index (BMI) 30.0-30.9, adult: Secondary | ICD-10-CM

## 2020-10-29 DIAGNOSIS — E7800 Pure hypercholesterolemia, unspecified: Secondary | ICD-10-CM

## 2020-10-29 DIAGNOSIS — E6609 Other obesity due to excess calories: Secondary | ICD-10-CM

## 2020-10-29 MED ORDER — ZOLPIDEM TARTRATE ER 12.5 MG PO TBCR
12.5000 mg | EXTENDED_RELEASE_TABLET | Freq: Every day | ORAL | 0 refills | Status: DC
  Filled 2020-10-29: qty 30, 30d supply, fill #0

## 2020-10-29 MED ORDER — CYCLOBENZAPRINE HCL 10 MG PO TABS
10.0000 mg | ORAL_TABLET | Freq: Three times a day (TID) | ORAL | 0 refills | Status: DC | PRN
  Filled 2020-10-29: qty 60, 20d supply, fill #0

## 2020-10-29 NOTE — Patient Instructions (Addendum)
Nice to see you today! Congratulations on your weight loss and lifestyle changes!  You are scheduled for a lab visit in the [facility] office on October 27th. Please come fasting to this visit.   Let us know if you want to try decreasing the dose to 1.7 mg to see if the burning improves.

## 2020-10-31 ENCOUNTER — Other Ambulatory Visit (HOSPITAL_COMMUNITY): Payer: Self-pay

## 2020-10-31 ENCOUNTER — Other Ambulatory Visit: Payer: Self-pay | Admitting: Internal Medicine

## 2020-10-31 MED ORDER — CLONAZEPAM 0.5 MG PO TABS
0.5000 mg | ORAL_TABLET | Freq: Every day | ORAL | 0 refills | Status: DC
  Filled 2020-10-31: qty 30, 30d supply, fill #0

## 2020-10-31 NOTE — Telephone Encounter (Signed)
Requested medication (s) are due for refill today - yes  Requested medication (s) are on the active medication list -yes  Future visit scheduled -no  Last refill: 09/29/20 #30  Notes to clinic: Request RF: non delegated Rx  Requested Prescriptions  Pending Prescriptions Disp Refills   clonazePAM (KLONOPIN) 0.5 MG tablet 30 tablet 0    Sig: Take 1 tablet (0.5 mg total) by mouth at bedtime.     Not Delegated - Psychiatry:  Anxiolytics/Hypnotics Failed - 10/31/2020  8:56 AM      Failed - This refill cannot be delegated      Failed - Urine Drug Screen completed in last 360 days      Passed - Valid encounter within last 6 months    Recent Outpatient Visits           2 months ago Type 2 diabetes mellitus without complication, without long-term current use of insulin ([place])   [facility], Coralie Keens, NP   4 months ago No-show for appointment   [facility] [place], Coralie Keens, NP                 Requested Prescriptions  Pending Prescriptions Disp Refills   clonazePAM (KLONOPIN) 0.5 MG tablet 30 tablet 0    Sig: Take 1 tablet (0.5 mg total) by mouth at bedtime.     Not Delegated - Psychiatry:  Anxiolytics/Hypnotics Failed - 10/31/2020  8:56 AM      Failed - This refill cannot be delegated      Failed - Urine Drug Screen completed in last 360 days      Passed - Valid encounter within last 6 months    Recent Outpatient Visits           2 months ago Type 2 diabetes mellitus without complication, without long-term current use of insulin ([place])   [facility] [place], Coralie Keens, NP   4 months ago No-show for appointment   [facility] [place], Coralie Keens, NP

## 2020-11-13 ENCOUNTER — Other Ambulatory Visit (HOSPITAL_COMMUNITY): Payer: Self-pay

## 2020-11-27 ENCOUNTER — Other Ambulatory Visit: Payer: Self-pay | Admitting: Internal Medicine

## 2020-11-27 ENCOUNTER — Other Ambulatory Visit (HOSPITAL_COMMUNITY): Payer: Self-pay

## 2020-11-27 MED ORDER — GABAPENTIN 300 MG PO CAPS
300.0000 mg | ORAL_CAPSULE | Freq: Two times a day (BID) | ORAL | 0 refills | Status: DC
  Filled 2020-11-27: qty 180, 90d supply, fill #0

## 2020-11-27 NOTE — Telephone Encounter (Signed)
Requested medication (s) are due for refill today: Yes  Requested medication (s) are on the active medication list: Yes  Last refill:  10/29/20  Future visit scheduled: No  Notes to clinic:  See requests.    Requested Prescriptions  Pending Prescriptions Disp Refills   cyclobenzaprine (FLEXERIL) 10 MG tablet 60 tablet 0    Sig: Take 1 tablet (10 mg total) by mouth 3 (three) times daily as needed for muscle spasms     Not Delegated - Analgesics:  Muscle Relaxants Failed - 11/27/2020 12:26 PM      Failed - This refill cannot be delegated      Passed - Valid encounter within last 6 months    Recent Outpatient Visits           3 months ago Type 2 diabetes mellitus without complication, without long-term current use of insulin ([place])   [place], NP   5 months ago No-show for appointment   [facility] [place], Coralie Keens, NP               zolpidem (AMBIEN CR) 12.5 MG CR tablet 30 tablet 0    Sig: Take 1 tablet (12.5 mg total) by mouth at bedtime.     Not Delegated - Psychiatry:  Anxiolytics/Hypnotics Failed - 11/27/2020 12:26 PM      Failed - This refill cannot be delegated      Failed - Urine Drug Screen completed in last 360 days      Passed - Valid encounter within last 6 months    Recent Outpatient Visits           3 months ago Type 2 diabetes mellitus without complication, without long-term current use of insulin ([place])   [place], NP   5 months ago No-show for appointment   [facility] [place], Coralie Keens, NP              Signed Prescriptions Disp Refills   gabapentin (NEURONTIN) 300 MG capsule 180 capsule 0    Sig: Take 1 capsule (300 mg total) by mouth 2 (two) times daily.     Neurology: Anticonvulsants - gabapentin Passed - 11/27/2020 12:26 PM      Passed - Valid encounter within last 12 months    Recent Outpatient Visits           3 months ago Type 2  diabetes mellitus without complication, without long-term current use of insulin ([place])   [facility] [place], Coralie Keens, NP   5 months ago No-show for appointment   [facility] [place], Coralie Keens, NP

## 2020-11-28 ENCOUNTER — Other Ambulatory Visit (HOSPITAL_COMMUNITY): Payer: Self-pay

## 2020-11-28 ENCOUNTER — Other Ambulatory Visit: Payer: No Typology Code available for payment source | Admitting: *Deleted

## 2020-11-28 ENCOUNTER — Other Ambulatory Visit: Payer: Self-pay

## 2020-11-28 DIAGNOSIS — E7800 Pure hypercholesterolemia, unspecified: Secondary | ICD-10-CM

## 2020-11-28 DIAGNOSIS — E119 Type 2 diabetes mellitus without complications: Secondary | ICD-10-CM

## 2020-11-28 LAB — LIPID PANEL
Chol/HDL Ratio: 1.9 ratio (ref 0.0–5.0)
Cholesterol, Total: 112 mg/dL (ref 100–199)
HDL: 60 mg/dL
LDL Chol Calc (NIH): 36 mg/dL (ref 0–99)
Triglycerides: 78 mg/dL (ref 0–149)
VLDL Cholesterol Cal: 16 mg/dL (ref 5–40)

## 2020-11-28 LAB — HEMOGLOBIN A1C
Est. average glucose Bld gHb Est-mCnc: 120 mg/dL
Hgb A1c MFr Bld: 5.8 % — ABNORMAL HIGH (ref 4.8–5.6)

## 2020-11-28 MED ORDER — ZOLPIDEM TARTRATE ER 12.5 MG PO TBCR
12.5000 mg | EXTENDED_RELEASE_TABLET | Freq: Every day | ORAL | 0 refills | Status: DC
  Filled 2020-11-28: qty 30, 30d supply, fill #0

## 2020-11-28 MED ORDER — CYCLOBENZAPRINE HCL 10 MG PO TABS
10.0000 mg | ORAL_TABLET | Freq: Three times a day (TID) | ORAL | 0 refills | Status: DC | PRN
  Filled 2020-11-28: qty 60, 20d supply, fill #0

## 2020-11-29 ENCOUNTER — Encounter: Payer: Self-pay | Admitting: Student-PharmD

## 2020-12-02 ENCOUNTER — Other Ambulatory Visit (HOSPITAL_COMMUNITY): Payer: Self-pay

## 2020-12-02 ENCOUNTER — Other Ambulatory Visit: Payer: Self-pay | Admitting: Internal Medicine

## 2020-12-02 NOTE — Telephone Encounter (Signed)
Requested medications are due for refill today.  yes  Requested medications are on the active medications list.  yes  Last refill. 10/31/2020  Future visit scheduled.   no  Notes to clinic.  Medication not delegated.

## 2020-12-03 ENCOUNTER — Other Ambulatory Visit (HOSPITAL_COMMUNITY): Payer: Self-pay

## 2020-12-03 MED ORDER — CLONAZEPAM 0.5 MG PO TABS
0.5000 mg | ORAL_TABLET | Freq: Every day | ORAL | 0 refills | Status: DC
  Filled 2020-12-03: qty 30, 30d supply, fill #0

## 2020-12-11 ENCOUNTER — Other Ambulatory Visit (HOSPITAL_COMMUNITY): Payer: Self-pay

## 2020-12-25 ENCOUNTER — Other Ambulatory Visit (HOSPITAL_COMMUNITY): Payer: Self-pay

## 2020-12-25 ENCOUNTER — Other Ambulatory Visit: Payer: Self-pay | Admitting: Internal Medicine

## 2020-12-25 NOTE — Telephone Encounter (Signed)
Requested medication (s) are due for refill today: Ambien due 12/29/20   Flexeril yes due  Requested medication (s) are on the active medication list: yes   Last refill: Ambien  11/28/20  #30  0 refills     Flexeril 11/28/20 #60  0 refills  Future visit scheduled no  Notes to clinic: not delegated  Requested Prescriptions  Pending Prescriptions Disp Refills   cyclobenzaprine (FLEXERIL) 10 MG tablet 60 tablet 0    Sig: Take 1 tablet (10 mg total) by mouth 3 (three) times daily as needed for muscle spasms     Not Delegated - Analgesics:  Muscle Relaxants Failed - 12/25/2020 10:49 AM      Failed - This refill cannot be delegated      Passed - Valid encounter within last 6 months    Recent Outpatient Visits           4 months ago Type 2 diabetes mellitus without complication, without long-term current use of insulin ([place])   [place], NP   6 months ago No-show for appointment   [facility] [place], Coralie Keens, NP               zolpidem (AMBIEN CR) 12.5 MG CR tablet 30 tablet 0    Sig: Take 1 tablet (12.5 mg total) by mouth at bedtime.     Not Delegated - Psychiatry:  Anxiolytics/Hypnotics Failed - 12/25/2020 10:49 AM      Failed - This refill cannot be delegated      Failed - Urine Drug Screen completed in last 360 days      Passed - Valid encounter within last 6 months    Recent Outpatient Visits           4 months ago Type 2 diabetes mellitus without complication, without long-term current use of insulin ([place])   [facility] [place], Coralie Keens, NP   6 months ago No-show for appointment   [facility] [place], Coralie Keens, NP

## 2020-12-27 ENCOUNTER — Other Ambulatory Visit (HOSPITAL_COMMUNITY): Payer: Self-pay

## 2020-12-27 ENCOUNTER — Other Ambulatory Visit: Payer: Self-pay | Admitting: Internal Medicine

## 2020-12-27 NOTE — Telephone Encounter (Signed)
Requested medication (s) are due for refill today: Yes  Requested medication (s) are on the active medication list: Yes  Last refill:  11/28/20  Future visit scheduled: No  Notes to clinic:  See request.    Requested Prescriptions  Pending Prescriptions Disp Refills   cyclobenzaprine (FLEXERIL) 10 MG tablet 60 tablet 0    Sig: Take 1 tablet (10 mg total) by mouth 3 (three) times daily as needed for muscle spasms     Not Delegated - Analgesics:  Muscle Relaxants Failed - 12/27/2020 10:05 AM      Failed - This refill cannot be delegated      Passed - Valid encounter within last 6 months    Recent Outpatient Visits           4 months ago Type 2 diabetes mellitus without complication, without long-term current use of insulin ([place])   [place], NP   6 months ago No-show for appointment   [facility] [employer], Coralie Keens, NP               zolpidem (AMBIEN CR) 12.5 MG CR tablet 30 tablet 0    Sig: Take 1 tablet (12.5 mg total) by mouth at bedtime.     Not Delegated - Psychiatry:  Anxiolytics/Hypnotics Failed - 12/27/2020 10:05 AM      Failed - This refill cannot be delegated      Failed - Urine Drug Screen completed in last 360 days      Passed - Valid encounter within last 6 months    Recent Outpatient Visits           4 months ago Type 2 diabetes mellitus without complication, without long-term current use of insulin ([place])   [facility] [place], Coralie Keens, NP   6 months ago No-show for appointment   [facility] [place], Coralie Keens, NP

## 2020-12-30 ENCOUNTER — Other Ambulatory Visit (HOSPITAL_COMMUNITY): Payer: Self-pay

## 2020-12-30 MED ORDER — ZOLPIDEM TARTRATE ER 12.5 MG PO TBCR
12.5000 mg | EXTENDED_RELEASE_TABLET | Freq: Every day | ORAL | 0 refills | Status: DC
  Filled 2020-12-30: qty 30, 30d supply, fill #0

## 2020-12-30 MED ORDER — CYCLOBENZAPRINE HCL 10 MG PO TABS
10.0000 mg | ORAL_TABLET | Freq: Three times a day (TID) | ORAL | 0 refills | Status: DC | PRN
  Filled 2020-12-30: qty 60, 20d supply, fill #0

## 2021-01-07 ENCOUNTER — Other Ambulatory Visit (HOSPITAL_COMMUNITY): Payer: Self-pay

## 2021-01-07 ENCOUNTER — Other Ambulatory Visit: Payer: Self-pay | Admitting: Cardiology

## 2021-01-07 ENCOUNTER — Other Ambulatory Visit: Payer: Self-pay | Admitting: Internal Medicine

## 2021-01-07 MED ORDER — CLONAZEPAM 0.5 MG PO TABS
0.5000 mg | ORAL_TABLET | Freq: Every day | ORAL | 0 refills | Status: DC
  Filled 2021-01-07: qty 30, 30d supply, fill #0

## 2021-01-07 NOTE — Telephone Encounter (Signed)
Requested medication (s) are due for refill today: yes  Requested medication (s) are on the active medication list: yes  Last refill:  12/03/20 #30  Future visit scheduled: no  Notes to clinic:  med not delegated to NT to RF   Requested Prescriptions  Pending Prescriptions Disp Refills   clonazePAM (KLONOPIN) 0.5 MG tablet 30 tablet 0    Sig: Take 1 tablet (0.5 mg total) by mouth at bedtime.     Not Delegated - Psychiatry:  Anxiolytics/Hypnotics Failed - 01/07/2021  7:21 AM      Failed - This refill cannot be delegated      Failed - Urine Drug Screen completed in last 360 days      Passed - Valid encounter within last 6 months    Recent Outpatient Visits           4 months ago Type 2 diabetes mellitus without complication, without long-term current use of insulin ([place])   [facility] [place], Coralie Keens, NP   7 months ago No-show for appointment   [facility] [place], Coralie Keens, NP

## 2021-01-08 ENCOUNTER — Other Ambulatory Visit (HOSPITAL_COMMUNITY): Payer: Self-pay

## 2021-01-08 MED ORDER — ROSUVASTATIN CALCIUM 20 MG PO TABS
20.0000 mg | ORAL_TABLET | Freq: Every day | ORAL | 0 refills | Status: DC
  Filled 2021-01-08: qty 90, 90d supply, fill #0

## 2021-01-08 MED ORDER — AMLODIPINE BESYLATE 10 MG PO TABS
10.0000 mg | ORAL_TABLET | Freq: Every day | ORAL | 0 refills | Status: DC
  Filled 2021-01-08: qty 90, 90d supply, fill #0

## 2021-01-28 ENCOUNTER — Other Ambulatory Visit (HOSPITAL_COMMUNITY): Payer: Self-pay

## 2021-01-28 ENCOUNTER — Other Ambulatory Visit: Payer: Self-pay | Admitting: Internal Medicine

## 2021-01-28 MED ORDER — CYCLOBENZAPRINE HCL 10 MG PO TABS
10.0000 mg | ORAL_TABLET | Freq: Three times a day (TID) | ORAL | 0 refills | Status: DC | PRN
  Filled 2021-01-28: qty 60, 20d supply, fill #0

## 2021-01-28 MED ORDER — ZOLPIDEM TARTRATE ER 12.5 MG PO TBCR
12.5000 mg | EXTENDED_RELEASE_TABLET | Freq: Every day | ORAL | 0 refills | Status: DC
  Filled 2021-01-28: qty 30, 30d supply, fill #0

## 2021-02-05 ENCOUNTER — Other Ambulatory Visit: Payer: Self-pay | Admitting: Internal Medicine

## 2021-02-05 ENCOUNTER — Other Ambulatory Visit (HOSPITAL_COMMUNITY): Payer: Self-pay

## 2021-02-06 ENCOUNTER — Other Ambulatory Visit (HOSPITAL_COMMUNITY): Payer: Self-pay

## 2021-02-06 MED ORDER — CLONAZEPAM 0.5 MG PO TABS
0.5000 mg | ORAL_TABLET | Freq: Every day | ORAL | 0 refills | Status: DC
  Filled 2021-02-06: qty 30, 30d supply, fill #0

## 2021-02-06 MED ORDER — ONDANSETRON HCL 4 MG PO TABS
4.0000 mg | ORAL_TABLET | Freq: Three times a day (TID) | ORAL | 1 refills | Status: DC | PRN
  Filled 2021-02-06: qty 20, 7d supply, fill #0
  Filled 2021-06-12 (×2): qty 20, 7d supply, fill #1

## 2021-02-06 NOTE — Telephone Encounter (Signed)
Requested medications are due for refill today.  yes  Requested medications are on the active medications list.  yes  Last refill. 01/07/2021  Future visit scheduled.   no  Notes to clinic.  Medication not delegated.    Requested Prescriptions  Pending Prescriptions Disp Refills   clonazePAM (KLONOPIN) 0.5 MG tablet 30 tablet 0    Sig: Take 1 tablet (0.5 mg total) by mouth at bedtime.     Not Delegated - Psychiatry:  Anxiolytics/Hypnotics Failed - 02/05/2021 11:56 AM      Failed - This refill cannot be delegated      Failed - Urine Drug Screen completed in last 360 days      Passed - Valid encounter within last 6 months    Recent Outpatient Visits           5 months ago Type 2 diabetes mellitus without complication, without long-term current use of insulin ([place])   [facility] [place], Coralie Keens, NP   8 months ago No-show for appointment   [facility] [place], Coralie Keens, NP

## 2021-02-06 NOTE — Telephone Encounter (Signed)
Requested medications are due for refill today.  yes  Requested medications are on the active medications list.  yes  Last refill. 08/30/2020  Future visit scheduled.   no  Notes to clinic.  Medication not delegated.    Requested Prescriptions  Pending Prescriptions Disp Refills   ondansetron (ZOFRAN) 4 MG tablet 20 tablet 1    Sig: TAKE 1 TABLET BY MOUTH EVERY 8 HOURS AS NEEDED FOR NAUSEA OR VOMITING.     Not Delegated - Gastroenterology: Antiemetics Failed - 02/06/2021  3:20 AM      Failed - This refill cannot be delegated      Passed - Valid encounter within last 6 months    Recent Outpatient Visits           5 months ago Type 2 diabetes mellitus without complication, without long-term current use of insulin [facility])   [facility] [place], Coralie Keens, NP   8 months ago No-show for appointment   [facility] [place], Coralie Keens, NP

## 2021-02-25 ENCOUNTER — Other Ambulatory Visit (HOSPITAL_COMMUNITY): Payer: Self-pay

## 2021-02-25 ENCOUNTER — Other Ambulatory Visit: Payer: Self-pay | Admitting: Internal Medicine

## 2021-02-25 MED ORDER — GABAPENTIN 300 MG PO CAPS
300.0000 mg | ORAL_CAPSULE | Freq: Two times a day (BID) | ORAL | 0 refills | Status: DC
  Filled 2021-02-25: qty 180, 90d supply, fill #0

## 2021-02-25 NOTE — Telephone Encounter (Signed)
Requested Prescriptions  Pending Prescriptions Disp Refills   gabapentin (NEURONTIN) 300 MG capsule 180 capsule 0    Sig: Take 1 capsule (300 mg total) by mouth 2 (two) times daily.     Neurology: Anticonvulsants - gabapentin Passed - 02/25/2021 11:44 AM      Passed - Valid encounter within last 12 months    Recent Outpatient Visits          6 months ago Type 2 diabetes mellitus without complication, without long-term current use of insulin [facility])   [facility] [place], Coralie Keens, NP   8 months ago No-show for appointment   [facility], Coralie Keens, NP

## 2021-02-27 ENCOUNTER — Other Ambulatory Visit: Payer: Self-pay | Admitting: Internal Medicine

## 2021-02-27 ENCOUNTER — Other Ambulatory Visit (HOSPITAL_COMMUNITY): Payer: Self-pay

## 2021-02-27 MED ORDER — CYCLOBENZAPRINE HCL 10 MG PO TABS
10.0000 mg | ORAL_TABLET | Freq: Three times a day (TID) | ORAL | 0 refills | Status: DC | PRN
  Filled 2021-02-27: qty 60, 20d supply, fill #0

## 2021-02-27 MED ORDER — ZOLPIDEM TARTRATE ER 12.5 MG PO TBCR
12.5000 mg | EXTENDED_RELEASE_TABLET | Freq: Every day | ORAL | 0 refills | Status: DC
Start: 2021-02-27 — End: 2021-03-31
  Filled 2021-02-27: qty 30, 30d supply, fill #0

## 2021-02-27 NOTE — Telephone Encounter (Signed)
Requested medication (s) are due for refill today:   Provider to review  Requested medication (s) are on the active medication list:   Yes for both  Future visit scheduled:   No   Last ordered: Flexeril 01/28/2021 #60, 0 refills;   Ambien 01/28/2021 #30, 0 refills  Returned because both non delegated refills   Requested Prescriptions  Pending Prescriptions Disp Refills   cyclobenzaprine (FLEXERIL) 10 MG tablet 60 tablet 0    Sig: Take 1 tablet (10 mg total) by mouth 3 (three) times daily as needed for muscle spasms.     Not Delegated - Analgesics:  Muscle Relaxants Failed - 02/27/2021 10:23 AM      Failed - This refill cannot be delegated      Failed - Valid encounter within last 6 months    Recent Outpatient Visits           6 months ago Type 2 diabetes mellitus without complication, without long-term current use of insulin [facility])   [facility], Coralie Keens, NP   8 months ago No-show for appointment   [facility] [place], Coralie Keens, NP               zolpidem (AMBIEN CR) 12.5 MG CR tablet 30 tablet 0    Sig: Take 1 tablet (12.5 mg total) by mouth at bedtime.     Not Delegated - Psychiatry:  Anxiolytics/Hypnotics Failed - 02/27/2021 10:23 AM      Failed - This refill cannot be delegated      Failed - Urine Drug Screen completed in last 360 days      Failed - Valid encounter within last 6 months    Recent Outpatient Visits           6 months ago Type 2 diabetes mellitus without complication, without long-term current use of insulin [facility])   [facility] [place], Coralie Keens, NP   8 months ago No-show for appointment   [facility] [place], Coralie Keens, NP

## 2021-03-10 ENCOUNTER — Other Ambulatory Visit: Payer: Self-pay | Admitting: Internal Medicine

## 2021-03-10 NOTE — Telephone Encounter (Signed)
Requested medication (s) are due for refill today: yes  Requested medication (s) are on the active medication list: yes  Last refill:  02/06/21 #30 with 0 RF  Future visit scheduled: no  Notes to clinic:  This medication can not be delegated, please assess.     Requested Prescriptions  Pending Prescriptions Disp Refills   clonazePAM (KLONOPIN) 0.5 MG tablet 30 tablet 0    Sig: Take 1 tablet (0.5 mg total) by mouth at bedtime.     Not Delegated - Psychiatry: Anxiolytics/Hypnotics 2 Failed - 03/10/2021  9:46 AM      Failed - This refill cannot be delegated      Failed - Urine Drug Screen completed in last 360 days      Failed - Valid encounter within last 6 months    Recent Outpatient Visits           6 months ago Type 2 diabetes mellitus without complication, without long-term current use of insulin [facility])   [facility], Coralie Keens, NP   9 months ago No-show for appointment   [place], NP              Passed - Patient is not pregnant

## 2021-03-11 ENCOUNTER — Other Ambulatory Visit: Payer: Self-pay | Admitting: Internal Medicine

## 2021-03-12 ENCOUNTER — Other Ambulatory Visit (HOSPITAL_COMMUNITY): Payer: Self-pay

## 2021-03-12 MED ORDER — CLONAZEPAM 0.5 MG PO TABS
0.5000 mg | ORAL_TABLET | Freq: Every day | ORAL | 0 refills | Status: DC
  Filled 2021-03-12: qty 30, 30d supply, fill #0

## 2021-03-12 NOTE — Telephone Encounter (Signed)
Requested medication (s) are due for refill today:   Provider to review  Requested medication (s) are on the active medication list:   Yes  Future visit scheduled:   No   Last ordered: 02/06/2021 #30, 0 refills  Returned because it's a non delegated refill per protocol.   Requested Prescriptions  Pending Prescriptions Disp Refills   clonazePAM (KLONOPIN) 0.5 MG tablet 30 tablet 0    Sig: Take 1 tablet (0.5 mg total) by mouth at bedtime.     Not Delegated - Psychiatry: Anxiolytics/Hypnotics 2 Failed - 03/11/2021  3:47 PM      Failed - This refill cannot be delegated      Failed - Urine Drug Screen completed in last 360 days      Failed - Valid encounter within last 6 months    Recent Outpatient Visits           6 months ago Type 2 diabetes mellitus without complication, without long-term current use of insulin [facility])   [facility], Coralie Keens, NP   9 months ago No-show for appointment   [place], NP              Passed - Patient is not pregnant

## 2021-03-18 ENCOUNTER — Telehealth: Payer: No Typology Code available for payment source | Admitting: Emergency Medicine

## 2021-03-18 DIAGNOSIS — R059 Cough, unspecified: Secondary | ICD-10-CM

## 2021-03-18 NOTE — Progress Notes (Signed)
Based on what you shared with me, I feel your condition warrants further evaluation and I recommend that you be seen in a face to face visit. You may need a chest x-ray, and this can only be arranged with an in-person visit.    NOTE: There will be NO CHARGE for this eVisit   If you are having a true medical emergency please call 911.      For an urgent face to face visit, [facility] has six urgent care centers for your convenience:     [place] [facility] [place] Get Driving Directions [phone] [place], [place] [address]    [place] [facility]) Get Driving Directions [phone] [place], [place] [address]  [place] [facility] [place]) Get Driving Directions [phone] [place],  [place]  [phone]  [place] [facility] Get Driving Directions [phone] [place], [place], [place] [address]   [place] [facility] [place] Get Driving Directions  [phone] [address] [place], [place] [address]   [place] [facility] [place] Get Driving Directions [phone] [address] [place], [place] [address]  Your MyChart E-visit questionnaire answers were reviewed by a board certified advanced clinical practitioner to complete your personal care plan based on your specific symptoms.  Thank you for using e-Visits.

## 2021-03-25 ENCOUNTER — Other Ambulatory Visit: Payer: Self-pay | Admitting: Family

## 2021-03-25 ENCOUNTER — Encounter: Payer: Self-pay | Admitting: Internal Medicine

## 2021-03-25 ENCOUNTER — Other Ambulatory Visit (HOSPITAL_COMMUNITY): Payer: Self-pay

## 2021-03-25 DIAGNOSIS — K219 Gastro-esophageal reflux disease without esophagitis: Secondary | ICD-10-CM

## 2021-03-31 ENCOUNTER — Other Ambulatory Visit: Payer: Self-pay | Admitting: Family

## 2021-03-31 ENCOUNTER — Other Ambulatory Visit (HOSPITAL_COMMUNITY): Payer: Self-pay

## 2021-03-31 ENCOUNTER — Encounter: Payer: Self-pay | Admitting: Internal Medicine

## 2021-03-31 ENCOUNTER — Other Ambulatory Visit: Payer: Self-pay | Admitting: Internal Medicine

## 2021-03-31 DIAGNOSIS — K219 Gastro-esophageal reflux disease without esophagitis: Secondary | ICD-10-CM

## 2021-03-31 MED ORDER — PANTOPRAZOLE SODIUM 40 MG PO TBEC
40.0000 mg | DELAYED_RELEASE_TABLET | Freq: Two times a day (BID) | ORAL | 0 refills | Status: DC
  Filled 2021-03-31: qty 60, 30d supply, fill #0

## 2021-04-01 ENCOUNTER — Other Ambulatory Visit (HOSPITAL_COMMUNITY): Payer: Self-pay

## 2021-04-01 MED ORDER — ZOLPIDEM TARTRATE ER 12.5 MG PO TBCR
12.5000 mg | EXTENDED_RELEASE_TABLET | Freq: Every day | ORAL | 0 refills | Status: DC
  Filled 2021-04-01 (×2): qty 15, 15d supply, fill #0

## 2021-04-01 MED ORDER — CYCLOBENZAPRINE HCL 10 MG PO TABS
10.0000 mg | ORAL_TABLET | Freq: Three times a day (TID) | ORAL | 0 refills | Status: DC | PRN
  Filled 2021-04-01: qty 60, 20d supply, fill #0

## 2021-04-01 NOTE — Telephone Encounter (Signed)
Requested medication (s) are due for refill today: yes  Requested medication (s) are on the active medication list: yes  Last refill:  02/27/21  Future visit scheduled: no  Notes to clinic:  Unable to refill per protocol, cannot delegate.      Requested Prescriptions  Pending Prescriptions Disp Refills   cyclobenzaprine (FLEXERIL) 10 MG tablet 60 tablet 0    Sig: Take 1 tablet (10 mg total) by mouth 3 (three) times daily as needed for muscle spasms.     Not Delegated - Analgesics:  Muscle Relaxants Failed - 03/31/2021 10:01 AM      Failed - This refill cannot be delegated      Failed - Valid encounter within last 6 months    Recent Outpatient Visits           7 months ago Type 2 diabetes mellitus without complication, without long-term current use of insulin [facility])   [facility] [place], Coralie Keens, NP   9 months ago No-show for appointment   [facility], Coralie Keens, NP               zolpidem (AMBIEN CR) 12.5 MG CR tablet 30 tablet 0    Sig: Take 1 tablet (12.5 mg total) by mouth at bedtime.     Not Delegated - Psychiatry:  Anxiolytics/Hypnotics Failed - 03/31/2021 10:01 AM      Failed - This refill cannot be delegated      Failed - Urine Drug Screen completed in last 360 days      Failed - Valid encounter within last 6 months    Recent Outpatient Visits           7 months ago Type 2 diabetes mellitus without complication, without long-term current use of insulin [facility])   [facility], Coralie Keens, NP   9 months ago No-show for appointment   [facility] [place], Coralie Keens, NP

## 2021-04-10 ENCOUNTER — Other Ambulatory Visit (HOSPITAL_COMMUNITY): Payer: Self-pay

## 2021-04-10 ENCOUNTER — Ambulatory Visit (INDEPENDENT_AMBULATORY_CARE_PROVIDER_SITE_OTHER): Payer: No Typology Code available for payment source | Admitting: Internal Medicine

## 2021-04-10 ENCOUNTER — Encounter: Payer: Self-pay | Admitting: Internal Medicine

## 2021-04-10 ENCOUNTER — Other Ambulatory Visit: Payer: Self-pay

## 2021-04-10 VITALS — BP 133/86 | HR 103 | Temp 97.1°F | Ht 69.0 in | Wt 196.0 lb

## 2021-04-10 DIAGNOSIS — Z6828 Body mass index (BMI) 28.0-28.9, adult: Secondary | ICD-10-CM

## 2021-04-10 DIAGNOSIS — E663 Overweight: Secondary | ICD-10-CM

## 2021-04-10 DIAGNOSIS — Z23 Encounter for immunization: Secondary | ICD-10-CM

## 2021-04-10 DIAGNOSIS — Z125 Encounter for screening for malignant neoplasm of prostate: Secondary | ICD-10-CM

## 2021-04-10 DIAGNOSIS — Z1211 Encounter for screening for malignant neoplasm of colon: Secondary | ICD-10-CM

## 2021-04-10 DIAGNOSIS — E119 Type 2 diabetes mellitus without complications: Secondary | ICD-10-CM

## 2021-04-10 DIAGNOSIS — Z1159 Encounter for screening for other viral diseases: Secondary | ICD-10-CM | POA: Diagnosis not present

## 2021-04-10 DIAGNOSIS — Z0001 Encounter for general adult medical examination with abnormal findings: Secondary | ICD-10-CM | POA: Diagnosis not present

## 2021-04-10 MED ORDER — CLONAZEPAM 0.5 MG PO TABS
0.5000 mg | ORAL_TABLET | Freq: Every day | ORAL | 0 refills | Status: DC
  Filled 2021-04-10: qty 30, 30d supply, fill #0

## 2021-04-10 NOTE — Progress Notes (Signed)
Subjective:    Patient ID: [name], male    DOB: February 22, 1970, 51 y.o.   MRN: [id number]  HPI  Patient presents the clinic today for his annual exam.  Flu: 10/2020 Tetanus: 10/2018 COVID: COVID x3 Pneumovax: 04/2014 Shingrix: Never PSA screening: Never Colon screening: 02/2016 Vision screening: as needed Dentist: biannually  Diet: He does eat meat. He consumes some fruits and veggies. He does eat some fried foods. He drinks mostly water, sugar free powerade Exercise: Walking  Review of Systems  Past Medical History:  Diagnosis Date   Anxiety    takes Xanax daily prn, panic attacks   Chronic gastric ulcer with bleeding s/p suture repair 2012 09/29/2010   Head 3 EGD which failed to stop the bleeding. Had exploratory laparotomy on 10/04/2010. Possible cause of the gastric ulcer was NSAID use.    Constipation    related to pain meds   Depression    Dizziness    occasionally   Dyspnea    walking a bit   Erosive esophagitis    External hemorrhoid, bleeding    Gastric ulcer    history of   GERD (gastroesophageal reflux disease)    takes Protonix bid   Hiatal hernia    History of blood clots 2012   in abdomen   History of blood transfusion 2012   Incisional hernia s/p open repair w mesh Aug 2013 05/26/2011   Joint pain    knees   Nocturia    depends on amount of fluid he drinks   Pneumonia 2018   Pre-diabetes    Primary localized osteoarthritis of left hip 04/24/2014   Serrated polyp of colon    Sleep apnea    no cpap  mild no cpap needed   Stroke [facility])    Unstable angina ([place]) 04/09/2020   Upper GI bleed    Ventral hernia     Current Outpatient Medications  Medication Sig Dispense Refill   albuterol (VENTOLIN HFA) 108 (90 Base) MCG/ACT inhaler INHALE 1-2 PUFFS INTO THE LUNGS EVERY 4 (FOUR) HOURS AS NEEDED FOR WHEEZING OR SHORTNESS OF BREATH. 18 g 0   amLODipine (NORVASC) 10 MG tablet Take 1 tablet (10 mg total) by mouth daily. 90 tablet 0   aspirin EC 81 MG EC  tablet Take 1 tablet (81 mg total) by mouth daily.     carvedilol (COREG) 3.125 MG tablet Take 3.125 mg by mouth 2 (two) times daily with a meal.     clonazePAM (KLONOPIN) 0.5 MG tablet Take 1 tablet (0.5 mg total) by mouth at bedtime. 30 tablet 0   cyclobenzaprine (FLEXERIL) 10 MG tablet Take 1 tablet (10 mg total) by mouth 3 (three) times daily as needed for muscle spasms. 60 tablet 0   fluticasone (FLONASE) 50 MCG/ACT nasal spray Place 2 sprays into both nostrils daily as needed for allergies. 16 g 11   gabapentin (NEURONTIN) 300 MG capsule Take 1 capsule (300 mg total) by mouth 2 (two) times daily. 180 capsule 0   lisinopril (ZESTRIL) 10 MG tablet Take 1 tablet (10 mg total) by mouth daily. 30 tablet 11   ondansetron (ZOFRAN) 4 MG tablet TAKE 1 TABLET BY MOUTH EVERY 8 HOURS AS NEEDED FOR NAUSEA OR VOMITING. 20 tablet 1   pantoprazole (PROTONIX) 40 MG tablet Take 1 tablet (40 mg total) by mouth 2 (two) times daily. 60 tablet 0   rosuvastatin (CRESTOR) 20 MG tablet Take 1 tablet (20 mg total) by mouth daily. 90 tablet  0   Semaglutide-Weight Management 2.4 MG/0.75ML SOAJ Inject 2.4 mg into the skin once a week. 3 mL 11   Topiramate ER (TROKENDI XR) 50 MG CP24 Take 1 capsule (50 mg) by mouth at bedtime. 30 capsule 5   zolpidem (AMBIEN CR) 12.5 MG CR tablet Take 1 tablet (12.5 mg total) by mouth at bedtime. 30 tablet 0   No current facility-administered medications for this visit.    Allergies  Allergen Reactions   Dilaudid [Hydromorphone Hcl] Rash and Other (See Comments)    Shakey   Hydromorphone Hcl Other (See Comments) and Rash    Shakey   Metoclopramide Other (See Comments), Rash, Swelling and Hives    Swelling in lips   Metoclopramide Hcl Hives, Swelling, Rash and Other (See Comments)    Swelling in lips   Nsaids     H/o bleeding gastric ulcers   Benadryl [Diphenhydramine]     Causes patient to become hyper and pace    Family History  Problem Relation Age of Onset   Diabetes  Mother    Liver disease Mother    Diabetes Father    Heart disease Father    Healthy Sister    Healthy Brother    Healthy Brother    Colon cancer Neg Hx    Stomach cancer Neg Hx    Rectal cancer Neg Hx    Esophageal cancer Neg Hx    Colon polyps Neg Hx     Social History   Socioeconomic History   Marital status: Single    Spouse name: Not on file   Number of children: 1   Years of education: 12   Highest education level: High school graduate  Occupational History   Occupation: Maintenance - [employer]  Tobacco Use   Smoking status: Never   Smokeless tobacco: Current    Types: Chew   Tobacco comments:    occ-Chew  Vaping Use   Vaping Use: Never used  Substance and Sexual Activity   Alcohol use: Yes    Alcohol/week: 0.0 standard drinks    Comment: 09/04/11 "very seldom"   Drug use: No   Sexual activity: Yes  Other Topics Concern   Not on file  Social History Narrative   Lives with brother in a one story home.  Has one daughter.  Works in Theatre manager for [employer].  Education: high school.       Patient is left-handed.   Social Determinants of Health   Financial Resource Strain: Not on file  Food Insecurity: Not on file  Transportation Needs: Not on file  Physical Activity: Not on file  Stress: Not on file  Social Connections: Not on file  Intimate Partner Violence: Not on file     Constitutional: Patient reports frequent headaches.  Denies fever, malaise, fatigue, or abrupt weight changes.  HEENT: Denies eye pain, eye redness, ear pain, ringing in the ears, wax buildup, runny nose, nasal congestion, bloody nose, or sore throat. Respiratory: Denies difficulty breathing, shortness of breath, cough or sputum production.   Cardiovascular: Denies chest pain, chest tightness, palpitations or swelling in the hands or feet.  Gastrointestinal: Patient reports intermittent bloating and constipation.  Denies abdominal pain, diarrhea or blood in the stool.  GU: Denies  urgency, frequency, pain with urination, burning sensation, blood in urine, odor or discharge. Musculoskeletal: Denies decrease in range of motion, difficulty with gait, muscle pain or joint pain and swelling.  Skin: Denies redness, rashes, lesions or ulcercations.  Neurological: Patient reports  numbness and tingling in his hands, insomnia.  Denies dizziness, difficulty with memory, difficulty with speech or problems with balance and coordination.  Psych: Patient has a history of anxiety.  Denies depression, SI/HI.  No other specific complaints in a complete review of systems (except as listed in HPI above).     Objective:   Physical Exam  BP 133/86 (BP Location: Left Arm, Patient Position: Sitting, Cuff Size: Large)    Pulse (!) 103    Temp (!) 97.1 F (36.2 C) (Temporal)    Ht 5\' 9"  (1.753 m)    Wt 196 lb (88.9 kg)    SpO2 99%    BMI 28.94 kg/m   Wt Readings from Last 3 Encounters:  10/29/20 199 lb 6.4 oz (90.4 kg)  08/27/20 209 lb 9.6 oz (95.1 kg)  08/01/20 213 lb (96.6 kg)    General: Appears his stated age, overweight, in NAD. Skin: Warm, dry and intact. No ulcerations noted. HEENT: Head: normal shape and size; Eyes: sclera white and EOMs intact; Neck:  Neck supple, trachea midline. No masses, lumps or thyromegaly present.  Cardiovascular: Normal rate and rhythm. S1,S2 noted.  No murmur, rubs or gallops noted. No JVD or BLE edema. No carotid bruits noted. Pulmonary/Chest: Normal effort and positive vesicular breath sounds. No respiratory distress. No wheezes, rales or ronchi noted.  Abdomen: Soft and nontender. Normal bowel sounds.  Musculoskeletal: Strength 5/5 BUE/BLE.  No difficulty with gait.  Neurological: Alert and oriented. Cranial nerves II-XII grossly intact. Coordination normal.  Psychiatric: Mood and affect normal. Behavior is normal. Judgment and thought content normal.   BMET    Component Value Date/Time   NA 139 08/27/2020 1345   NA 142 04/26/2020 0849   K 4.7  08/27/2020 1345   CL 105 08/27/2020 1345   CO2 24 08/27/2020 1345   GLUCOSE 83 08/27/2020 1345   BUN 17 08/27/2020 1345   BUN 18 04/26/2020 0849   CREATININE 1.16 08/27/2020 1345   CALCIUM 10.2 08/27/2020 1345   GFRNONAA >60 04/11/2020 0320   GFRAA 96 12/12/2019 1134    Lipid Panel     Component Value Date/Time   CHOL 112 11/28/2020 0746   TRIG 78 11/28/2020 0746   HDL 60 11/28/2020 0746   CHOLHDL 1.9 11/28/2020 0746   CHOLHDL 1.7 08/27/2020 1345   VLDL 32 04/10/2020 0402   LDLCALC 36 11/28/2020 0746   LDLCALC 25 08/27/2020 1345    CBC    Component Value Date/Time   WBC 8.2 04/11/2020 0320   RBC 4.43 04/11/2020 0320   HGB 13.8 04/11/2020 0320   HCT 40.5 04/11/2020 0320   PLT 256 04/11/2020 0320   MCV 91.4 04/11/2020 0320   MCH 31.2 04/11/2020 0320   MCHC 34.1 04/11/2020 0320   RDW 13.2 04/11/2020 0320   LYMPHSABS 2.4 04/09/2020 1553   MONOABS 1.0 04/09/2020 1553   EOSABS 0.2 04/09/2020 1553   BASOSABS 0.1 04/09/2020 1553    Hgb A1C Lab Results  Component Value Date   HGBA1C 5.8 (H) 11/28/2020            Assessment & Plan:   Preventative Health Maintenance:  Encouraged him to get a flu shot in the fall Tetanus UTD Encouraged him to get his COVID booster Pneumovax today Colon screening UTD Encouraged him to consume a balanced diet and exercise regimen Advised him to see an eye doctor and dentist annually We will check CBC, c-Met, lipid, A1c, urine microalbumin, PSA and hep C today  RTC in 6 months, follow-up chronic conditions  Webb Silversmith, NP This visit occurred during the SARS-CoV-2 public health emergency.  Safety protocols were in place, including screening questions prior to the visit, additional usage of staff PPE, and extensive cleaning of exam room while observing appropriate contact time as indicated for disinfecting solutions.

## 2021-04-10 NOTE — Assessment & Plan Note (Signed)
Encourage diet and exercise for weight loss 

## 2021-04-10 NOTE — Addendum Note (Signed)
Addended by: Ashley Royalty E on: 04/10/2021 03:56 PM ? ? Modules accepted: Orders ? ?

## 2021-04-10 NOTE — Patient Instructions (Signed)

## 2021-04-11 LAB — CBC
HCT: 40.1 % (ref 38.5–50.0)
Hemoglobin: 13.1 g/dL — ABNORMAL LOW (ref 13.2–17.1)
MCH: 30.4 pg (ref 27.0–33.0)
MCHC: 32.7 g/dL (ref 32.0–36.0)
MCV: 93 fL (ref 80.0–100.0)
MPV: 11 fL (ref 7.5–12.5)
Platelets: 331 10*3/uL (ref 140–400)
RBC: 4.31 10*6/uL (ref 4.20–5.80)
RDW: 12.9 % (ref 11.0–15.0)
WBC: 8 10*3/uL (ref 3.8–10.8)

## 2021-04-11 LAB — LIPID PANEL
Cholesterol: 174 mg/dL (ref <200)
HDL: 65 mg/dL (ref >=40)
LDL Cholesterol (Calc): 90 mg/dL (calc)
Non-HDL Cholesterol (Calc): 109 mg/dL (calc) (ref <130)
Total CHOL/HDL Ratio: 2.7 (calc) (ref <5.0)
Triglycerides: 101 mg/dL (ref <150)

## 2021-04-11 LAB — COMPLETE METABOLIC PANEL WITH GFR
AG Ratio: 1.8 (calc) (ref 1.0–2.5)
ALT: 25 U/L (ref 9–46)
AST: 21 U/L (ref 10–35)
Albumin: 4.5 g/dL (ref 3.6–5.1)
Alkaline phosphatase (APISO): 58 U/L (ref 35–144)
BUN: 19 mg/dL (ref 7–25)
CO2: 27 mmol/L (ref 20–32)
Calcium: 10 mg/dL (ref 8.6–10.3)
Chloride: 105 mmol/L (ref 98–110)
Creat: 1.14 mg/dL (ref 0.70–1.30)
Globulin: 2.5 g/dL (calc) (ref 1.9–3.7)
Glucose, Bld: 85 mg/dL (ref 65–139)
Potassium: 5 mmol/L (ref 3.5–5.3)
Sodium: 141 mmol/L (ref 135–146)
Total Bilirubin: 0.5 mg/dL (ref 0.2–1.2)
Total Protein: 7 g/dL (ref 6.1–8.1)
eGFR: 78 mL/min/{1.73_m2} (ref >=60)

## 2021-04-11 LAB — HEMOGLOBIN A1C
Hgb A1c MFr Bld: 5.7 % of total Hgb — ABNORMAL HIGH (ref <5.7)
Mean Plasma Glucose: 117 mg/dL
eAG (mmol/L): 6.5 mmol/L

## 2021-04-11 LAB — HEPATITIS C ANTIBODY
Hepatitis C Ab: NONREACTIVE
SIGNAL TO CUT-OFF: 0.06 (ref <1.00)

## 2021-04-11 LAB — PSA: PSA: 1.62 ng/mL (ref <=4.00)

## 2021-04-22 ENCOUNTER — Other Ambulatory Visit (HOSPITAL_COMMUNITY): Payer: Self-pay

## 2021-04-28 ENCOUNTER — Other Ambulatory Visit (HOSPITAL_COMMUNITY): Payer: Self-pay

## 2021-04-28 ENCOUNTER — Other Ambulatory Visit: Payer: Self-pay | Admitting: Internal Medicine

## 2021-04-28 DIAGNOSIS — K219 Gastro-esophageal reflux disease without esophagitis: Secondary | ICD-10-CM

## 2021-04-29 ENCOUNTER — Other Ambulatory Visit: Payer: Self-pay | Admitting: Internal Medicine

## 2021-04-29 ENCOUNTER — Other Ambulatory Visit (HOSPITAL_COMMUNITY): Payer: Self-pay

## 2021-04-29 DIAGNOSIS — K219 Gastro-esophageal reflux disease without esophagitis: Secondary | ICD-10-CM

## 2021-04-29 MED ORDER — PANTOPRAZOLE SODIUM 40 MG PO TBEC
40.0000 mg | DELAYED_RELEASE_TABLET | Freq: Two times a day (BID) | ORAL | 2 refills | Status: DC
  Filled 2021-04-29: qty 60, 30d supply, fill #0
  Filled 2021-06-12: qty 60, 30d supply, fill #1
  Filled 2021-07-11: qty 60, 30d supply, fill #2

## 2021-04-29 NOTE — Telephone Encounter (Signed)
Requested Prescriptions  ?Pending Prescriptions Disp Refills  ?? cyclobenzaprine (FLEXERIL) 10 MG tablet 60 tablet 0  ?  Sig: Take 1 tablet (10 mg total) by mouth 3 (three) times daily as needed for muscle spasms.  ?  ? Not Delegated - Analgesics:  Muscle Relaxants Failed - 04/29/2021 10:18 AM  ?  ?  Failed - This refill cannot be delegated  ?  ?  Passed - Valid encounter within last 6 months  ?  Recent Outpatient Visits   ?      ? 2 weeks ago Screening for colon cancer  ? [facility] [place], [place] W, NP  ? 8 months ago Type 2 diabetes mellitus without complication, without long-term current use of insulin ([place])  ? [facility] [place], Coralie Keens, NP  ? 10 months ago No-show for appointment  ? [facility] [place], [place] W, NP  ?  ?  ? ?  ?  ?  ?? pantoprazole (PROTONIX) 40 MG tablet 60 tablet 2  ?  Sig: Take 1 tablet (40 mg total) by mouth 2 (two) times daily.  ?  ? Gastroenterology: Proton Pump Inhibitors Passed - 04/29/2021 10:18 AM  ?  ?  Passed - Valid encounter within last 12 months  ?  Recent Outpatient Visits   ?      ? 2 weeks ago Screening for colon cancer  ? [facility] [place], [place] W, NP  ? 8 months ago Type 2 diabetes mellitus without complication, without long-term current use of insulin ([place])  ? [facility] [place], Coralie Keens, NP  ? 10 months ago No-show for appointment  ? [facility] [place], Coralie Keens, NP  ?  ?  ? ?  ?  ?  ? ?

## 2021-04-29 NOTE — Telephone Encounter (Signed)
Requested medication (s) are due for refill today - yes ? ?Requested medication (s) are on the active medication list -yes ? ?Future visit scheduled -no ? ?Last refill: 04/01/21 #60 ? ?Notes to clinic: Request RF: non delegated Rx ? ?Requested Prescriptions  ?Pending Prescriptions Disp Refills  ? cyclobenzaprine (FLEXERIL) 10 MG tablet 60 tablet 0  ?  Sig: Take 1 tablet (10 mg total) by mouth 3 (three) times daily as needed for muscle spasms.  ?  ? Not Delegated - Analgesics:  Muscle Relaxants Failed - 04/29/2021 10:18 AM  ?  ?  Failed - This refill cannot be delegated  ?  ?  Passed - Valid encounter within last 6 months  ?  Recent Outpatient Visits   ? ?      ? 2 weeks ago Screening for colon cancer  ? [facility] [place], [place] W, NP  ? 8 months ago Type 2 diabetes mellitus without complication, without long-term current use of insulin ([place])  ? [facility] [place], Coralie Keens, NP  ? 10 months ago No-show for appointment  ? [facility] [place], Coralie Keens, NP  ? ?  ?  ? ?  ?  ?  ?Signed Prescriptions Disp Refills  ? pantoprazole (PROTONIX) 40 MG tablet 60 tablet 2  ?  Sig: Take 1 tablet (40 mg total) by mouth 2 (two) times daily.  ?  ? Gastroenterology: Proton Pump Inhibitors Passed - 04/29/2021 10:18 AM  ?  ?  Passed - Valid encounter within last 12 months  ?  Recent Outpatient Visits   ? ?      ? 2 weeks ago Screening for colon cancer  ? [facility] [place], [place] W, NP  ? 8 months ago Type 2 diabetes mellitus without complication, without long-term current use of insulin ([place])  ? [facility] [place], Coralie Keens, NP  ? 10 months ago No-show for appointment  ? [facility] [place], Coralie Keens, NP  ? ?  ?  ? ?  ?  ?  ? ? ? ?Requested Prescriptions  ?Pending Prescriptions Disp Refills  ? cyclobenzaprine (FLEXERIL) 10 MG tablet 60 tablet 0  ?  Sig: Take 1 tablet (10 mg total) by mouth 3 (three) times daily as needed for muscle spasms.  ?  ? Not  Delegated - Analgesics:  Muscle Relaxants Failed - 04/29/2021 10:18 AM  ?  ?  Failed - This refill cannot be delegated  ?  ?  Passed - Valid encounter within last 6 months  ?  Recent Outpatient Visits   ? ?      ? 2 weeks ago Screening for colon cancer  ? [facility] [place], [place] W, NP  ? 8 months ago Type 2 diabetes mellitus without complication, without long-term current use of insulin ([place])  ? [facility] [place], Coralie Keens, NP  ? 10 months ago No-show for appointment  ? [facility] [place], Coralie Keens, NP  ? ?  ?  ? ?  ?  ?  ?Signed Prescriptions Disp Refills  ? pantoprazole (PROTONIX) 40 MG tablet 60 tablet 2  ?  Sig: Take 1 tablet (40 mg total) by mouth 2 (two) times daily.  ?  ? Gastroenterology: Proton Pump Inhibitors Passed - 04/29/2021 10:18 AM  ?  ?  Passed - Valid encounter within last 12 months  ?  Recent Outpatient Visits   ? ?      ?  2 weeks ago Screening for colon cancer  ? [facility] [place], [place] W, NP  ? 8 months ago Type 2 diabetes mellitus without complication, without long-term current use of insulin ([place])  ? [facility] [place], Coralie Keens, NP  ? 10 months ago No-show for appointment  ? [facility] [place], Coralie Keens, NP  ? ?  ?  ? ?  ?  ?  ? ? ? ?

## 2021-04-30 ENCOUNTER — Other Ambulatory Visit (HOSPITAL_COMMUNITY): Payer: Self-pay

## 2021-04-30 MED ORDER — ZOLPIDEM TARTRATE ER 12.5 MG PO TBCR
12.5000 mg | EXTENDED_RELEASE_TABLET | Freq: Every day | ORAL | 0 refills | Status: DC
  Filled 2021-04-30: qty 30, 30d supply, fill #0

## 2021-04-30 MED ORDER — CYCLOBENZAPRINE HCL 10 MG PO TABS
10.0000 mg | ORAL_TABLET | Freq: Two times a day (BID) | ORAL | 0 refills | Status: DC | PRN
  Filled 2021-04-30: qty 60, 30d supply, fill #0

## 2021-04-30 NOTE — Telephone Encounter (Signed)
Requested medication (s) are due for refill today: yes ? ?Requested medication (s) are on the active medication list: yes ? ?Last refill:  04/01/21 #30/0 ? ?Future visit scheduled: No ? ?Notes to clinic:  Unable to refill per protocol, cannot delegate. ? ? ? ?  ?Requested Prescriptions  ?Pending Prescriptions Disp Refills  ? zolpidem (AMBIEN CR) 12.5 MG CR tablet 30 tablet 0  ?  Sig: Take 1 tablet (12.5 mg total) by mouth at bedtime.  ?  ? Not Delegated - Psychiatry:  Anxiolytics/Hypnotics Failed - 04/29/2021  8:13 AM  ?  ?  Failed - This refill cannot be delegated  ?  ?  Failed - Urine Drug Screen completed in last 360 days  ?  ?  Passed - Valid encounter within last 6 months  ?  Recent Outpatient Visits   ? ?      ? 2 weeks ago Screening for colon cancer  ? [facility] [place], [place] W, NP  ? 8 months ago Type 2 diabetes mellitus without complication, without long-term current use of insulin ([place])  ? [facility] [place], Coralie Keens, NP  ? 10 months ago No-show for appointment  ? [facility] [place], Coralie Keens, NP  ? ?  ?  ? ?  ?  ?  ? ?

## 2021-05-01 ENCOUNTER — Other Ambulatory Visit: Payer: Self-pay

## 2021-05-01 ENCOUNTER — Other Ambulatory Visit: Payer: Self-pay | Admitting: Internal Medicine

## 2021-05-01 ENCOUNTER — Other Ambulatory Visit (HOSPITAL_COMMUNITY): Payer: Self-pay

## 2021-05-01 MED ORDER — ROSUVASTATIN CALCIUM 20 MG PO TABS
20.0000 mg | ORAL_TABLET | Freq: Every day | ORAL | 0 refills | Status: DC
  Filled 2021-05-01: qty 30, 30d supply, fill #0

## 2021-05-01 MED ORDER — AMLODIPINE BESYLATE 10 MG PO TABS
10.0000 mg | ORAL_TABLET | Freq: Every day | ORAL | 0 refills | Status: DC
  Filled 2021-05-01: qty 30, 30d supply, fill #0

## 2021-05-02 ENCOUNTER — Other Ambulatory Visit (HOSPITAL_COMMUNITY): Payer: Self-pay

## 2021-05-02 MED ORDER — CARVEDILOL 3.125 MG PO TABS
3.1250 mg | ORAL_TABLET | Freq: Two times a day (BID) | ORAL | 1 refills | Status: DC
  Filled 2021-05-02: qty 180, 90d supply, fill #0
  Filled 2021-07-29: qty 180, 90d supply, fill #1

## 2021-05-12 ENCOUNTER — Other Ambulatory Visit: Payer: Self-pay | Admitting: Internal Medicine

## 2021-05-12 ENCOUNTER — Other Ambulatory Visit (HOSPITAL_COMMUNITY): Payer: Self-pay

## 2021-05-12 NOTE — Telephone Encounter (Signed)
Requested medications are due for refill today.  yes ? ?Requested medications are on the active medications list.  yes ? ?Last refill. 04/10/2021 #30 0 refills ? ?Future visit scheduled.   no ? ?Notes to clinic.  Medication refill is not delegated. ? ? ? ?Requested Prescriptions  ?Pending Prescriptions Disp Refills  ? clonazePAM (KLONOPIN) 0.5 MG tablet 30 tablet 0  ?  Sig: Take 1 tablet (0.5 mg total) by mouth at bedtime.  ?  ? Not Delegated - Psychiatry: Anxiolytics/Hypnotics 2 Failed - 05/12/2021  8:28 AM  ?  ?  Failed - This refill cannot be delegated  ?  ?  Failed - Urine Drug Screen completed in last 360 days  ?  ?  Passed - Patient is not pregnant  ?  ?  Passed - Valid encounter within last 6 months  ?  Recent Outpatient Visits   ? ?      ? 1 month ago Screening for colon cancer  ? [facility] [place], [place] W, NP  ? 8 months ago Type 2 diabetes mellitus without complication, without long-term current use of insulin ([place])  ? [facility] [place], Coralie Keens, NP  ? 11 months ago No-show for appointment  ? [facility] [place], Coralie Keens, NP  ? ?  ?  ? ?  ?  ?  ?  ?

## 2021-05-13 ENCOUNTER — Other Ambulatory Visit (HOSPITAL_COMMUNITY): Payer: Self-pay

## 2021-05-13 MED ORDER — CLONAZEPAM 0.5 MG PO TABS
0.5000 mg | ORAL_TABLET | Freq: Every day | ORAL | 0 refills | Status: DC
  Filled 2021-05-13: qty 30, 30d supply, fill #0

## 2021-05-20 ENCOUNTER — Other Ambulatory Visit (HOSPITAL_COMMUNITY): Payer: Self-pay

## 2021-05-27 ENCOUNTER — Other Ambulatory Visit: Payer: Self-pay | Admitting: Internal Medicine

## 2021-05-29 ENCOUNTER — Other Ambulatory Visit: Payer: Self-pay | Admitting: Internal Medicine

## 2021-05-29 ENCOUNTER — Other Ambulatory Visit (HOSPITAL_COMMUNITY): Payer: Self-pay

## 2021-05-29 MED ORDER — GABAPENTIN 300 MG PO CAPS
300.0000 mg | ORAL_CAPSULE | Freq: Two times a day (BID) | ORAL | 0 refills | Status: DC
Start: 2021-05-29 — End: 2021-08-11
  Filled 2021-05-29: qty 180, 90d supply, fill #0

## 2021-05-29 NOTE — Telephone Encounter (Signed)
Requested Prescriptions  ?Pending Prescriptions Disp Refills  ?? gabapentin (NEURONTIN) 300 MG capsule 180 capsule 0  ?  Sig: Take 1 capsule (300 mg total) by mouth 2 (two) times daily.  ?  ? Neurology: Anticonvulsants - gabapentin Passed - 05/27/2021  3:36 PM  ?  ?  Passed - Cr in normal range and within 360 days  ?  Creat  ?Date Value Ref Range Status  ?04/10/2021 1.14 0.70 - 1.30 mg/dL Final  ?   ?  ?  Passed - Completed PHQ-2 or PHQ-9 in the last 360 days  ?  ?  Passed - Valid encounter within last 12 months  ?  Recent Outpatient Visits   ?      ? 1 month ago Screening for colon cancer  ? [facility] [place], [place] W, NP  ? 9 months ago Type 2 diabetes mellitus without complication, without long-term current use of insulin ([place])  ? [facility] [place], Coralie Keens, NP  ? 11 months ago No-show for appointment  ? [facility] [place], Coralie Keens, NP  ?  ?  ? ?  ?  ?  ? ?

## 2021-05-30 ENCOUNTER — Other Ambulatory Visit (HOSPITAL_COMMUNITY): Payer: Self-pay

## 2021-05-30 MED ORDER — CYCLOBENZAPRINE HCL 10 MG PO TABS
10.0000 mg | ORAL_TABLET | Freq: Two times a day (BID) | ORAL | 0 refills | Status: DC | PRN
  Filled 2021-05-30: qty 60, 30d supply, fill #0

## 2021-05-30 MED ORDER — ZOLPIDEM TARTRATE ER 12.5 MG PO TBCR
12.5000 mg | EXTENDED_RELEASE_TABLET | Freq: Every day | ORAL | 0 refills | Status: DC
  Filled 2021-05-30: qty 30, 30d supply, fill #0

## 2021-05-30 NOTE — Telephone Encounter (Signed)
Requested medication (s) are due for refill today: yes ? ?Requested medication (s) are on the active medication list: yes ? ?Last refill:  04/29/21 ? ?Future visit scheduled: no ? ?Notes to clinic:  Unable to refill per protocol, cannot delegate. ? ? ? ?  ?Requested Prescriptions  ?Pending Prescriptions Disp Refills  ? zolpidem (AMBIEN CR) 12.5 MG CR tablet 30 tablet 0  ?  Sig: Take 1 tablet (12.5 mg total) by mouth at bedtime.  ?  ? Not Delegated - Psychiatry:  Anxiolytics/Hypnotics Failed - 05/29/2021  9:31 AM  ?  ?  Failed - This refill cannot be delegated  ?  ?  Failed - Urine Drug Screen completed in last 360 days  ?  ?  Passed - Valid encounter within last 6 months  ?  Recent Outpatient Visits   ? ?      ? 1 month ago Screening for colon cancer  ? [facility] [place], [place], NP  ? 9 months ago Type 2 diabetes mellitus without complication, without long-term current use of insulin ([place])  ? [facility] [place], Coralie Keens, NP  ? 11 months ago No-show for appointment  ? [facility] [place], [place] W, NP  ? ?  ?  ? ? ?  ?  ?  ? cyclobenzaprine (FLEXERIL) 10 MG tablet 60 tablet 0  ?  Sig: Take 1 tablet (10 mg total) by mouth 2 (two) times daily as needed.  ?  ? Not Delegated - Analgesics:  Muscle Relaxants Failed - 05/29/2021  9:31 AM  ?  ?  Failed - This refill cannot be delegated  ?  ?  Passed - Valid encounter within last 6 months  ?  Recent Outpatient Visits   ? ?      ? 1 month ago Screening for colon cancer  ? [facility] [place], [place] W, NP  ? 9 months ago Type 2 diabetes mellitus without complication, without long-term current use of insulin ([place])  ? [facility] [place], Coralie Keens, NP  ? 11 months ago No-show for appointment  ? [facility] [place], Coralie Keens, NP  ? ?  ?  ? ? ?  ?  ?  ? ? ?

## 2021-06-10 ENCOUNTER — Encounter: Payer: Self-pay | Admitting: Internal Medicine

## 2021-06-12 ENCOUNTER — Other Ambulatory Visit (HOSPITAL_COMMUNITY): Payer: Self-pay

## 2021-06-12 ENCOUNTER — Other Ambulatory Visit: Payer: Self-pay | Admitting: Internal Medicine

## 2021-06-12 MED ORDER — CLONAZEPAM 0.5 MG PO TABS
0.5000 mg | ORAL_TABLET | Freq: Every day | ORAL | 0 refills | Status: DC
  Filled 2021-06-12: qty 30, 30d supply, fill #0

## 2021-06-12 NOTE — Telephone Encounter (Signed)
Requested medication (s) are due for refill today: yes ? ?Requested medication (s) are on the active medication list: yes ? ?Last refill:  05/13/21 #30 ? ?Future visit scheduled: no ? ?Notes to clinic:  med not delegated to NT to RF ? ? ?Requested Prescriptions  ?Pending Prescriptions Disp Refills  ? clonazePAM (KLONOPIN) 0.5 MG tablet 30 tablet 0  ?  Sig: Take 1 tablet (0.5 mg total) by mouth at bedtime.  ?  ? Not Delegated - Psychiatry: Anxiolytics/Hypnotics 2 Failed - 06/12/2021  9:45 AM  ?  ?  Failed - This refill cannot be delegated  ?  ?  Failed - Urine Drug Screen completed in last 360 days  ?  ?  Passed - Patient is not pregnant  ?  ?  Passed - Valid encounter within last 6 months  ?  Recent Outpatient Visits   ? ?      ? 2 months ago Screening for colon cancer  ? [facility] [place], [place] W, NP  ? 9 months ago Type 2 diabetes mellitus without complication, without long-term current use of insulin ([place])  ? [facility] [place], Coralie Keens, NP  ? 1 year ago No-show for appointment  ? [facility] [place], Coralie Keens, NP  ? ?  ?  ? ? ?  ?  ?  ? ? ? ? ?

## 2021-06-24 ENCOUNTER — Other Ambulatory Visit: Payer: Self-pay

## 2021-06-24 ENCOUNTER — Other Ambulatory Visit (HOSPITAL_COMMUNITY): Payer: Self-pay

## 2021-06-24 ENCOUNTER — Ambulatory Visit (AMBULATORY_SURGERY_CENTER): Payer: Self-pay

## 2021-06-24 VITALS — Ht 69.0 in | Wt 195.0 lb

## 2021-06-24 DIAGNOSIS — Z86010 Personal history of colonic polyps: Secondary | ICD-10-CM

## 2021-06-24 MED ORDER — NA SULFATE-K SULFATE-MG SULF 17.5-3.13-1.6 GM/177ML PO SOLN
1.0000 | Freq: Once | ORAL | 0 refills | Status: AC
  Filled 2021-06-24: qty 354, 1d supply, fill #0

## 2021-06-24 NOTE — Progress Notes (Signed)
Denies allergies to eggs or soy products. Denies complication of anesthesia or sedation. Denies use of weight loss medication. Denies use of O2.   Emmi instructions given for colonoscopy.  

## 2021-06-26 ENCOUNTER — Other Ambulatory Visit: Payer: Self-pay | Admitting: Internal Medicine

## 2021-06-30 NOTE — Telephone Encounter (Signed)
Requested medication (s) are due for refill today- yes  Requested medication (s) are on the active medication list -yes  Future visit scheduled -no  Last refill: zolpidem- 05/30/21 #30                 Cyclobenzaprine- 05/30/21 #60  Notes to clinic: non delegated Rx  Requested Prescriptions  Pending Prescriptions Disp Refills   zolpidem (AMBIEN CR) 12.5 MG CR tablet 30 tablet 0    Sig: Take 1 tablet (12.5 mg total) by mouth at bedtime.     Not Delegated - Psychiatry:  Anxiolytics/Hypnotics Failed - 06/26/2021  2:10 PM      Failed - This refill cannot be delegated      Failed - Urine Drug Screen completed in last 360 days      Passed - Valid encounter within last 6 months    Recent Outpatient Visits           2 months ago Screening for colon cancer   [facility] [place], Coralie Keens, NP   10 months ago Type 2 diabetes mellitus without complication, without long-term current use of insulin ([place])   [facility], Coralie Keens, NP   1 year ago No-show for appointment   [facility] [place], Coralie Keens, NP                cyclobenzaprine (FLEXERIL) 10 MG tablet 60 tablet 0    Sig: Take 1 tablet (10 mg total) by mouth 2 (two) times daily as needed.     Not Delegated - Analgesics:  Muscle Relaxants Failed - 06/26/2021  2:10 PM      Failed - This refill cannot be delegated      Passed - Valid encounter within last 6 months    Recent Outpatient Visits           2 months ago Screening for colon cancer   [facility] [place], Coralie Keens, NP   10 months ago Type 2 diabetes mellitus without complication, without long-term current use of insulin ([place])   [facility], Coralie Keens, NP   1 year ago No-show for appointment   [facility] [place], Coralie Keens, NP                  Requested Prescriptions  Pending Prescriptions Disp Refills   zolpidem (AMBIEN CR) 12.5 MG CR tablet 30 tablet 0     Sig: Take 1 tablet (12.5 mg total) by mouth at bedtime.     Not Delegated - Psychiatry:  Anxiolytics/Hypnotics Failed - 06/26/2021  2:10 PM      Failed - This refill cannot be delegated      Failed - Urine Drug Screen completed in last 360 days      Passed - Valid encounter within last 6 months    Recent Outpatient Visits           2 months ago Screening for colon cancer   [place], Coralie Keens, NP   10 months ago Type 2 diabetes mellitus without complication, without long-term current use of insulin [facility])   [facility] [place], Coralie Keens, NP   1 year ago No-show for appointment   [facility] [place], Coralie Keens, NP                cyclobenzaprine (FLEXERIL) 10 MG tablet 60 tablet 0  Sig: Take 1 tablet (10 mg total) by mouth 2 (two) times daily as needed.     Not Delegated - Analgesics:  Muscle Relaxants Failed - 06/26/2021  2:10 PM      Failed - This refill cannot be delegated      Passed - Valid encounter within last 6 months    Recent Outpatient Visits           2 months ago Screening for colon cancer   [place], Coralie Keens, NP   10 months ago Type 2 diabetes mellitus without complication, without long-term current use of insulin [facility])   [facility] [place], Coralie Keens, NP   1 year ago No-show for appointment   [facility] [place], Coralie Keens, NP

## 2021-07-01 ENCOUNTER — Other Ambulatory Visit (HOSPITAL_COMMUNITY): Payer: Self-pay

## 2021-07-01 MED ORDER — CYCLOBENZAPRINE HCL 10 MG PO TABS
10.0000 mg | ORAL_TABLET | Freq: Two times a day (BID) | ORAL | 0 refills | Status: DC | PRN
  Filled 2021-07-01: qty 60, 30d supply, fill #0

## 2021-07-01 MED ORDER — ZOLPIDEM TARTRATE ER 12.5 MG PO TBCR
12.5000 mg | EXTENDED_RELEASE_TABLET | Freq: Every day | ORAL | 0 refills | Status: DC
  Filled 2021-07-01: qty 30, 30d supply, fill #0

## 2021-07-02 NOTE — Progress Notes (Unsigned)
NEUROLOGY FOLLOW UP OFFICE NOTE  [name] [id number]  Assessment/Plan:     1.  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  Headache, stable 4.  Hypertension 5.  Hyperlipidemia 6.  Type 2 diabetes mellitus 7.  Bilateral carpal tunnel syndrome - worsening numbness despite surgery.   1.  Headache prophylaxis:  Trokendi XR 79m QHS 2.  Headache rescue:  Tylenol 3.  Limit use of pain relievers to no more than 2 days out of week to prevent risk of rebound or medication-overuse headache. 4.  Secondary stroke prevention as managed by PCP: - ASA 869mdaily - statin.  LDL goal less than 70 - Normotensive blood pressure - follow up with PCP regarding elevated blood pressure - Glycemic control.  Hgb A1c goal less than 7 5.  Recheck NCV-EMG of upper extremities 6.  Follow up in one year.   Subjective:  [name] a [age]ear old man with unstable anginawho follows up for stroke, myelopathy and headache.   UPDATE: Current medications:  Trokendi XR 5097mHS, ASA 81m46mmlodipine, carvedilol, lisinopril, gabapentin 300mg6m,, Flexeril, ondansetron 4mg, 46mpidem 12.5mg, r85mvastatin   Due to elevated blood pressure, nortriptyline was discontinued and he was switched to Trokendi  They are moderate and usually occur a couple of weekends a month.  He treats with Tylenol and headaches last 3 hours.       Has some numbness in the arms and hands which is residual from carpal tunnel syndrome.  He underwent surgery but no significant improvement.  Wearing wrist splints at night and on weekends.     HISTORY: 1.  Stroke: He was admitted to [name]/2017 to 10/07/2017 after presenting with vertigo.  2 days prior, he began experiencing sensation of room spinning with right arm numbness, paresthesias and weakness and right leg paresthesias associated with headache and photophobia.   MRI of the brain revealed a probable punctate acute/early subacute ischemic infarct within the left thalamo-mesencephalic junction.  MRA of the head showed no aneurysm or large vessel occlusion or stenosis.  Carotid Doppler showed no hemodynamically significant extracranial stenosis.  2D echo demonstrated normal ejection fraction with no cardiac source of emboli.  LDL was 76.  Hemoglobin A1c was 5.9.  He was started on Lipitor 40mg da63m  He was discharged on aspirin 81 mg and Plavix 75 mg daily for 3 weeks, followed by aspirin alone.   2.  Headache: Following discharge, he continued to have a constant severe right-sided pressure-like headache involving the right temple and right side of face associated with right sided facial numbness and tingling.  He also noted worsening slurred speech, right upper extremity numbness and weakness.  He developed new blurred peripheral vision in his right eye.  He also endorsed postural lightheadedness when standing but not vertigo.  He presented to the ED on 11/06/17 for further evaluation.  Labs were unremarkable.  CT of head demonstrated no acute findings.  MRI of brain was normal with resolution of prior punctate focus.  He was treated with a migraine cocktail and discharged.  Past medication:  Nortriptyline (helpful but discontinued due to possibly contributing to elevated blood pressure)   3.  Myelopathy: Following the stroke, he developed numbness and tingling in both arms and legs for about a month  He reports tightness and numbness and tingling in glove distribution up to elbows.  He reports tightness between  his shoulder blades but no significant neck pain or radicular pain down the arms.  He also reports tightness and soreness in his hamstrings and his legs feel wobbly.  He denies low back pain.  It is a constant feeling.  He reports aches and soreness in the arms and legs. Labs from 12/28/17 included TSH 2.30, B12 1,113, and elevated CK of 551.  He was advised to  stop atorvastatin but symptoms have persisted.  Labs from 01/04/18 showed reduced CK of 306, CRP 0.1, vitamin D 29.69, negative Lyme IgG and IgM antibodies, and negative RMSF antibody.  Repeat CK  From 01/14/18 was mildly increased to 334.  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  Findings on testing thought to be incidental and not contributing to his symptoms.  Repeat CK from February 2020 was 149.  He subsequently reported that the paresthesias and tightness sensation involves the left side of arm and torso and a little bit in the leg.   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  He was referred to neurosurgery and underwent C3-C7 ACDF on 04/08/18.   PAST MEDICAL HISTORY: Past Medical History:  Diagnosis Date   Anxiety    takes Xanax daily prn, panic attacks   Chronic gastric ulcer with bleeding s/p suture repair 2012 09/29/2010   Head 3 EGD which failed to stop the bleeding. Had exploratory laparotomy on 10/04/2010. Possible cause of the gastric ulcer was NSAID use.    Clotting disorder (HCC)    Constipation    related to pain meds   Depression    Diabetes mellitus without complication (HCC)    Dizziness    occasionally   Dyspnea    walking a bit   Erosive esophagitis    External hemorrhoid, bleeding    Gastric ulcer    history of   GERD (gastroesophageal reflux disease)    takes Protonix bid   Hiatal hernia    History of blood clots 2012   in abdomen   History of blood transfusion 2012    Hyperlipidemia    Hypertension    Incisional hernia s/p open repair w mesh Aug 2013 05/26/2011   Joint pain    knees   Nocturia    depends on amount of fluid he drinks   Pneumonia 2018   Pre-diabetes    Primary localized osteoarthritis of left hip 04/24/2014   Serrated polyp of colon    Sleep apnea    no cpap  mild no cpap needed   Stroke [name])    Unstable angina ([place]) 04/09/2020   Upper GI bleed    Ventral hernia     MEDICATIONS: Current Outpatient Medications on File Prior to Visit  Medication Sig Dispense Refill   albuterol (VENTOLIN HFA) 108 (90 Base) MCG/ACT inhaler INHALE 1-2 PUFFS INTO THE LUNGS EVERY 4 (FOUR) HOURS AS NEEDED FOR WHEEZING OR SHORTNESS OF BREATH. 18 g 0   amLODipine (NORVASC) 10 MG tablet Take 1 tablet (10 mg total) by mouth daily. 30 tablet 0   aspirin EC 81 MG EC tablet Take 1 tablet (81  mg total) by mouth daily.     carvedilol (COREG) 3.125 MG tablet Take 1 tablet (3.125 mg total) by mouth 2 (two) times daily with a meal. 180 tablet 1   clonazePAM (KLONOPIN) 0.5 MG tablet Take 1 tablet (0.5 mg total) by mouth at bedtime. 30 tablet 0   cyclobenzaprine (FLEXERIL) 10 MG tablet Take 1 tablet (10 mg total) by mouth 2 (two) times daily as needed. 60 tablet 0   fluticasone (FLONASE) 50 MCG/ACT nasal spray Place 2 sprays into both nostrils daily as needed for allergies. 16 g 11   gabapentin (NEURONTIN) 300 MG capsule Take 1 capsule (300 mg total) by mouth 2 (two) times daily. 180 capsule 0   lisinopril (ZESTRIL) 10 MG tablet Take 1 tablet (10 mg total) by mouth daily. 30 tablet 11   Na Sulfate-K Sulfate-Mg Sulf 17.5-3.13-1.6 GM/177ML SOLN Take 1 kit by mouth once for 1 dose. 354 mL 0   ondansetron (ZOFRAN) 4 MG tablet TAKE 1 TABLET BY MOUTH EVERY 8 HOURS AS NEEDED FOR NAUSEA OR VOMITING. 20 tablet 1   pantoprazole (PROTONIX) 40 MG tablet Take 1 tablet (40 mg total) by mouth 2 (two) times daily. 60 tablet 2   rosuvastatin (CRESTOR) 20 MG tablet Take 1 tablet (20  mg total) by mouth daily. 30 tablet 0   Semaglutide-Weight Management 2.4 MG/0.75ML SOAJ Inject 2.4 mg into the skin once a week. 3 mL 11   Topiramate ER (TROKENDI XR) 50 MG CP24 Take 1 capsule (50 mg) by mouth at bedtime. 30 capsule 5   zolpidem (AMBIEN CR) 12.5 MG CR tablet Take 1 tablet (12.5 mg total) by mouth at bedtime. 30 tablet 0   [DISCONTINUED] nortriptyline (PAMELOR) 25 MG capsule TAKE 1 CAPSULE BY MOUTH AT BEDTIME 30 capsule 0   [DISCONTINUED] simvastatin (ZOCOR) 10 MG tablet Take 1 tablet (10 mg total) by mouth daily. 90 tablet 2   No current facility-administered medications on file prior to visit.    ALLERGIES: Allergies  Allergen Reactions   Dilaudid [Hydromorphone Hcl] Rash and Other (See Comments)    Shakey   Hydromorphone Hcl Other (See Comments) and Rash    Shakey   Metoclopramide Other (See Comments), Rash, Swelling and Hives    Swelling in lips   Metoclopramide Hcl Hives, Swelling, Rash and Other (See Comments)    Swelling in lips   Nsaids     H/o bleeding gastric ulcers   Benadryl [Diphenhydramine]     Causes patient to become hyper and pace    FAMILY HISTORY: Family History  Problem Relation Age of Onset   Diabetes Mother    Liver disease Mother    Diabetes Father    Heart disease Father    Healthy Sister    Healthy Brother    Healthy Brother    Colon cancer Neg Hx    Stomach cancer Neg Hx    Rectal cancer Neg Hx    Esophageal cancer Neg Hx    Colon polyps Neg Hx    Prostate cancer Neg Hx       Objective:  Blood pressure 138/86, pulse (!) 110, height 5' 9" (1.753 m), weight 197 lb (89.4 kg), SpO2 100 %. General: No acute distress.  Patient appears well-groomed.   Head:  Normocephalic/atraumatic Eyes:  Fundi examined but not visualized Neck: supple, no paraspinal tenderness, full range of motion Heart:  Regular rate and rhythm Lungs:  Clear to auscultation bilaterally Back: No paraspinal tenderness Neurological Exam: alert and oriented to  person, place, and time.  Speech fluent and not dysarthric, language intact.  Difficulty with tracking my finger (which is not new).  Otherwise, CN II-XII intact. Bulk and tone normal, muscle strength 5/5 throughout.  Sensation to light touch intact.  Deep tendon reflexes 3+ throughout, toes downgoing.  Finger to nose testing intact.  Gait normal, Romberg negative.   Metta Clines, DO  CC: Webb Silversmith, NP

## 2021-07-03 ENCOUNTER — Ambulatory Visit (INDEPENDENT_AMBULATORY_CARE_PROVIDER_SITE_OTHER): Payer: No Typology Code available for payment source | Admitting: Neurology

## 2021-07-03 ENCOUNTER — Encounter: Payer: Self-pay | Admitting: Neurology

## 2021-07-03 ENCOUNTER — Other Ambulatory Visit (HOSPITAL_COMMUNITY): Payer: Self-pay

## 2021-07-03 VITALS — BP 138/86 | HR 110 | Ht 69.0 in | Wt 197.0 lb

## 2021-07-03 DIAGNOSIS — I639 Cerebral infarction, unspecified: Secondary | ICD-10-CM

## 2021-07-03 DIAGNOSIS — M4802 Spinal stenosis, cervical region: Secondary | ICD-10-CM | POA: Diagnosis not present

## 2021-07-03 DIAGNOSIS — G992 Myelopathy in diseases classified elsewhere: Secondary | ICD-10-CM

## 2021-07-03 DIAGNOSIS — E119 Type 2 diabetes mellitus without complications: Secondary | ICD-10-CM

## 2021-07-03 DIAGNOSIS — R519 Headache, unspecified: Secondary | ICD-10-CM | POA: Diagnosis not present

## 2021-07-03 DIAGNOSIS — I10 Essential (primary) hypertension: Secondary | ICD-10-CM

## 2021-07-03 DIAGNOSIS — G5603 Carpal tunnel syndrome, bilateral upper limbs: Secondary | ICD-10-CM

## 2021-07-03 MED ORDER — TOPIRAMATE ER 50 MG PO CAP24
50.0000 mg | ORAL_CAPSULE | Freq: Every day | ORAL | 5 refills | Status: DC
Start: 2021-07-03 — End: 2022-07-24
  Filled 2021-07-03: qty 30, 30d supply, fill #0
  Filled 2021-08-11: qty 30, 30d supply, fill #1
  Filled 2021-09-12: qty 30, 30d supply, fill #2
  Filled 2021-11-17: qty 30, 30d supply, fill #3
  Filled 2022-01-19: qty 30, 30d supply, fill #4
  Filled 2022-05-22: qty 30, 30d supply, fill #5

## 2021-07-10 ENCOUNTER — Encounter: Payer: Self-pay | Admitting: Internal Medicine

## 2021-07-11 ENCOUNTER — Other Ambulatory Visit: Payer: Self-pay | Admitting: Internal Medicine

## 2021-07-11 ENCOUNTER — Other Ambulatory Visit: Payer: Self-pay

## 2021-07-11 ENCOUNTER — Other Ambulatory Visit (HOSPITAL_COMMUNITY): Payer: Self-pay

## 2021-07-11 MED ORDER — ALBUTEROL SULFATE HFA 108 (90 BASE) MCG/ACT IN AERS
1.0000 | INHALATION_SPRAY | RESPIRATORY_TRACT | 0 refills | Status: DC | PRN
Start: 2021-07-11 — End: 2021-10-08
  Filled 2021-07-11: qty 18, 17d supply, fill #0

## 2021-07-11 MED ORDER — ROSUVASTATIN CALCIUM 20 MG PO TABS
20.0000 mg | ORAL_TABLET | Freq: Every day | ORAL | 0 refills | Status: DC
  Filled 2021-07-11: qty 15, 15d supply, fill #0

## 2021-07-11 MED ORDER — CLONAZEPAM 0.5 MG PO TABS
0.5000 mg | ORAL_TABLET | Freq: Every day | ORAL | 0 refills | Status: DC
  Filled 2021-07-11: qty 30, 30d supply, fill #0

## 2021-07-11 MED ORDER — AMLODIPINE BESYLATE 10 MG PO TABS
10.0000 mg | ORAL_TABLET | Freq: Every day | ORAL | 0 refills | Status: DC
  Filled 2021-07-11: qty 15, 15d supply, fill #0

## 2021-07-11 NOTE — Telephone Encounter (Signed)
Requested medication (s) are due for refill today - yes  Requested medication (s) are on the active medication list -yes  Future visit scheduled -no  Last refill: 06/12/21 #30  Notes to clinic: non delegated Rx  Requested Prescriptions  Pending Prescriptions Disp Refills   clonazePAM (KLONOPIN) 0.5 MG tablet 30 tablet 0    Sig: Take 1 tablet (0.5 mg total) by mouth at bedtime.     Not Delegated - Psychiatry: Anxiolytics/Hypnotics 2 Failed - 07/11/2021  7:04 AM      Failed - This refill cannot be delegated      Failed - Urine Drug Screen completed in last 360 days      Passed - Patient is not pregnant      Passed - Valid encounter within last 6 months    Recent Outpatient Visits           3 months ago Screening for colon cancer   [place], Coralie Keens, NP   10 months ago Type 2 diabetes mellitus without complication, without long-term current use of insulin ([place])   [facility], Coralie Keens, NP   1 year ago No-show for appointment   [facility] [place], Coralie Keens, NP                 Requested Prescriptions  Pending Prescriptions Disp Refills   clonazePAM (KLONOPIN) 0.5 MG tablet 30 tablet 0    Sig: Take 1 tablet (0.5 mg total) by mouth at bedtime.     Not Delegated - Psychiatry: Anxiolytics/Hypnotics 2 Failed - 07/11/2021  7:04 AM      Failed - This refill cannot be delegated      Failed - Urine Drug Screen completed in last 360 days      Passed - Patient is not pregnant      Passed - Valid encounter within last 6 months    Recent Outpatient Visits           3 months ago Screening for colon cancer   [place], Coralie Keens, NP   10 months ago Type 2 diabetes mellitus without complication, without long-term current use of insulin [facility])   [facility] [place], Coralie Keens, NP   1 year ago No-show for appointment   [facility] [place], Coralie Keens, NP

## 2021-07-11 NOTE — Telephone Encounter (Signed)
Requested medication (s) are due for refill today -expired Rx  Requested medication (s) are on the active medication list -yes  Future visit scheduled -no  Last refill: 04/16/20 18g   Notes to clinic: expired Rx  Requested Prescriptions  Pending Prescriptions Disp Refills   albuterol (VENTOLIN HFA) 108 (90 Base) MCG/ACT inhaler 18 g 0    Sig: INHALE 1-2 PUFFS INTO THE LUNGS EVERY 4 (FOUR) HOURS AS NEEDED FOR WHEEZING OR SHORTNESS OF BREATH.     Pulmonology:  Beta Agonists 2 Passed - 07/11/2021 11:39 AM      Passed - Last BP in normal range    BP Readings from Last 1 Encounters:  07/03/21 138/86         Passed - Last Heart Rate in normal range    Pulse Readings from Last 1 Encounters:  07/03/21 (!) 110         Passed - Valid encounter within last 12 months    Recent Outpatient Visits           3 months ago Screening for colon cancer   [facility] [place], Coralie Keens, NP   10 months ago Type 2 diabetes mellitus without complication, without long-term current use of insulin ([place])   [facility], Coralie Keens, NP   1 year ago No-show for appointment   [facility] [place], Coralie Keens, NP                 Requested Prescriptions  Pending Prescriptions Disp Refills   albuterol (VENTOLIN HFA) 108 (90 Base) MCG/ACT inhaler 18 g 0    Sig: INHALE 1-2 PUFFS INTO THE LUNGS EVERY 4 (FOUR) HOURS AS NEEDED FOR WHEEZING OR SHORTNESS OF BREATH.     Pulmonology:  Beta Agonists 2 Passed - 07/11/2021 11:39 AM      Passed - Last BP in normal range    BP Readings from Last 1 Encounters:  07/03/21 138/86         Passed - Last Heart Rate in normal range    Pulse Readings from Last 1 Encounters:  07/03/21 (!) 110         Passed - Valid encounter within last 12 months    Recent Outpatient Visits           3 months ago Screening for colon cancer   [facility] [place], Coralie Keens, NP   10 months ago Type 2 diabetes mellitus  without complication, without long-term current use of insulin [facility])   [facility] [place], Coralie Keens, NP   1 year ago No-show for appointment   [facility] [place], Coralie Keens, NP

## 2021-07-15 ENCOUNTER — Encounter: Payer: Self-pay | Admitting: Internal Medicine

## 2021-07-15 ENCOUNTER — Ambulatory Visit (AMBULATORY_SURGERY_CENTER): Payer: No Typology Code available for payment source | Admitting: Internal Medicine

## 2021-07-15 VITALS — BP 101/60 | HR 83 | Temp 98.4°F | Resp 18 | Ht 69.0 in | Wt 195.0 lb

## 2021-07-15 DIAGNOSIS — Z86010 Personal history of colonic polyps: Secondary | ICD-10-CM | POA: Diagnosis not present

## 2021-07-15 DIAGNOSIS — Z09 Encounter for follow-up examination after completed treatment for conditions other than malignant neoplasm: Secondary | ICD-10-CM

## 2021-07-15 HISTORY — PX: COLONOSCOPY: SHX174

## 2021-07-15 MED ORDER — SODIUM CHLORIDE 0.9 % IV SOLN: 500.0000 mL | Freq: Once | INTRAVENOUS | Status: DC

## 2021-07-15 NOTE — Progress Notes (Signed)
Pt non-responsive, VVS, Report to RN  °

## 2021-07-15 NOTE — Patient Instructions (Addendum)
Read all of the handouts given to you by your recovery room nurse.  You will need another colonosocopy in 1 year.  Your prep was fair.   You will need a 2 day prep at that time.  Office vivit first, and then an edoscopy and colonoscopy at that time  [place]:   Refer to the procedure report that was given to you for any specific questions about what was found during the examination.  If the procedure report does not answer your questions, please call your gastroenterologist to clarify.  If you requested that your care partner not be given the details of your procedure findings, then the procedure report has been included in a sealed envelope for you to review at your convenience later.  YOU SHOULD EXPECT: Some feelings of bloating in the abdomen. Passage of more gas than usual.  Walking can help get rid of the air that was put into your GI tract during the procedure and reduce the bloating. If you had a lower endoscopy (such as a colonoscopy or flexible sigmoidoscopy) you may notice spotting of blood in your stool or on the toilet paper. If you underwent a bowel prep for your procedure, you may not have a normal bowel movement for a few days.  Please Note:  You might notice some irritation and congestion in your nose or some drainage.  This is from the oxygen used during your procedure.  There is no need for concern and it should clear up in a day or so.  SYMPTOMS TO REPORT IMMEDIATELY:  Following lower endoscopy (colonoscopy or flexible sigmoidoscopy):  Excessive amounts of blood in the stool  Significant tenderness or worsening of abdominal pains  Swelling of the abdomen that is new, acute  Fever of 100F or higher   For urgent or emergent issues, a gastroenterologist can be reached at any hour by calling [phone]. Do not use MyChart messaging for urgent concerns.    DIET:  We do recommend a small meal at first, but then you  may proceed to your regular diet.  Drink plenty of fluids but you should avoid alcoholic beverages for 24 hours.  ACTIVITY:  You should plan to take it easy for the rest of today and you should NOT DRIVE or use heavy machinery until tomorrow (because of the sedation medicines used during the test).    FOLLOW UP: Our staff will call the number listed on your records 24-72 hours following your procedure to check on you and address any questions or concerns that you may have regarding the information given to you following your procedure. If we do not reach you, we will leave a message.  We will attempt to reach you two times.  During this call, we will ask if you have developed any symptoms of COVID 19. If you develop any symptoms (ie: fever, flu-like symptoms, shortness of breath, cough etc.) before then, please call [phone].  If you test positive for Covid 19 in the 2 weeks post procedure, please call and report this information to [place].     SIGNATURES/CONFIDENTIALITY: You and/or your care partner have signed paperwork which will be entered into your electronic medical record.  These signatures attest to the fact that that the information above on your After Visit Summary has been reviewed and is understood.  Full responsibility of the confidentiality of this discharge information lies with you and/or your care-partner.

## 2021-07-15 NOTE — Op Note (Addendum)
[facility] Patient Name: [name] Procedure Date: 07/15/2021 2:36 PM MRN: [id number] Endoscopist: Jerene Bears , MD Age: 51 Referring MD:  Date of Birth: 1970-08-20 Gender: Male Account #: [id number] Procedure:                Colonoscopy Indications:              High risk colon cancer surveillance: Personal                            history of sessile serrated colon polyp (less than                            10 mm in size) with no dysplasia, Last colonoscopy:                            January 2018 Medicines:                Monitored Anesthesia Care Procedure:                Pre-Anesthesia Assessment:                           - Prior to the procedure, a History and Physical                            was performed, and patient medications and                            allergies were reviewed. The patient's tolerance of                            previous anesthesia was also reviewed. The risks                            and benefits of the procedure and the sedation                            options and risks were discussed with the patient.                            All questions were answered, and informed consent                            was obtained. Prior Anticoagulants: The patient has                            taken no previous anticoagulant or antiplatelet                            agents. ASA Grade Assessment: III - A patient with                            severe systemic disease. After reviewing the risks  and benefits, the patient was deemed in                            satisfactory condition to undergo the procedure.                           After obtaining informed consent, the colonoscope                            was passed under direct vision. Throughout the                            procedure, the patient's blood pressure, pulse, and                            oxygen saturations were monitored continuously. The                             CF [id number] #[id number] was introduced through the anus                            and advanced to the cecum, identified by                            appendiceal orifice and ileocecal valve. The                            colonoscopy was technically difficult and complex                            due to poor endoscopic visualization. The patient                            tolerated the procedure well. The quality of the                            bowel preparation was fair. The ileocecal valve,                            appendiceal orifice, and rectum were photographed. Scope In: 2:44:35 PM Scope Out: 3:12:33 PM Scope Withdrawal Time: 0 hours 16 minutes 59 seconds  Total Procedure Duration: 0 hours 27 minutes 58 seconds  Findings:                 The digital rectal exam was normal.                           The colon (entire examined portion) appeared                            normal. Limitation of prep quality.                           Internal hemorrhoids were found during  retroflexion. The hemorrhoids were small. Complications:            No immediate complications. Estimated Blood Loss:     Estimated blood loss: none. Impression:               - Preparation of the colon was fair.                           - The entire examined colon is normal.                           - Internal hemorrhoids.                           - No specimens collected. Recommendation:           - Patient has a contact number available for                            emergencies. The signs and symptoms of potential                            delayed complications were discussed with the                            patient. Return to normal activities tomorrow.                            Written discharge instructions were provided to the                            patient.                           - Resume previous diet.                           -  Continue present medications.                           - Repeat colonoscopy in 1 year because the bowel                            preparation was fair with 2 day bowel prep. Repeat                            EGD recommended at that time for history of GERD                            with esophagitis. Jerene Bears, MD 07/15/2021 3:17:50 PM This report has been signed electronically.

## 2021-07-15 NOTE — Progress Notes (Signed)
GASTROENTEROLOGY PROCEDURE H&P NOTE   Primary Care Physician: [name], NP    Reason for Procedure:  History of SSP  Plan:    Surveillance colonoscopy  Patient is appropriate for endoscopic procedure(s) in the ambulatory ([place]) setting.  The nature of the procedure, as well as the risks, benefits, and alternatives were carefully and thoroughly reviewed with the patient. Ample time for discussion and questions allowed. The patient understood, was satisfied, and agreed to proceed.     HPI: [name] is a 51 y.o. male who presents for colonoscopy.  Medical history as below.  Tolerated the prep.  No recent chest pain or shortness of breath.  No abdominal pain today.  Past Medical History:  Diagnosis Date   Anxiety    takes Xanax daily prn, panic attacks   Chronic gastric ulcer with bleeding s/p suture repair 2012 09/29/2010   Head 3 EGD which failed to stop the bleeding. Had exploratory laparotomy on 10/04/2010. Possible cause of the gastric ulcer was NSAID use.    Clotting disorder (HCC)    Constipation    related to pain meds   Depression    Diabetes mellitus without complication (HCC)    Dizziness    occasionally   Dyspnea    walking a bit   Erosive esophagitis    External hemorrhoid, bleeding    Gastric ulcer    history of   GERD (gastroesophageal reflux disease)    takes Protonix bid   Hiatal hernia    History of blood clots 2012   in abdomen   History of blood transfusion 2012   Hyperlipidemia    Hypertension    Incisional hernia s/p open repair w mesh Aug 2013 05/26/2011   Joint pain    knees   Nocturia    depends on amount of fluid he drinks   Pneumonia 2018   Pre-diabetes    Primary localized osteoarthritis of left hip 04/24/2014   Serrated polyp of colon    Sleep apnea    no cpap  mild no cpap needed   Stroke [facility])    Unstable angina ([place]) 04/09/2020   Upper GI bleed    Ventral hernia     Past Surgical History:  Procedure  Laterality Date   ANTERIOR CERVICAL DECOMPRESSION/DISCECTOMY FUSION 4 LEVELS N/A 04/08/2018   Procedure: ACDF - C3-C4 - C4-C5 - C5-C6 - C6-C7;  Surgeon: Kary Kos, MD;  Location: [place];  Service: Neurosurgery;  Laterality: N/A;   CARPAL TUNNEL RELEASE Left 04/03/2018   COLONOSCOPY     COLONOSCOPY  07/15/2021   ESOPHAGEAL MANOMETRY N/A 10/19/2016   Procedure: ESOPHAGEAL MANOMETRY (EM);  Surgeon: Mauri Pole, MD;  [facility];  Service: Endoscopy;  Laterality: N/A;   ESOPHAGOGASTRODUODENOSCOPY     ESOPHAGOGASTRODUODENOSCOPY N/A 01/22/2017   Procedure: ESOPHAGOGASTRODUODENOSCOPY (EGD);  Surgeon: [name], MD;  Location: [facility];  Service: General;  Laterality: N/A;   [place]  09/04/2011   Procedure: HERNIA REPAIR INCISIONAL;  Surgeon: Madilyn Hook, DO;  Location: [place];  Service: General;  Laterality: N/A;  debridment calcified mass   INSERTION OF MESH  09/04/2011   retrorectus ultrapro "30x30cm"   INSERTION OF MESH N/A 01/22/2017   Procedure: INSERTION OF MESH;  Surgeon: [name], MD;  [facility];  Service: General;  Laterality: N/A;   LEFT HEART CATH AND CORONARY ANGIOGRAPHY N/A 04/11/2020   Procedure: LEFT HEART CATH AND CORONARY ANGIOGRAPHY;  Surgeon: Lorretta Harp, MD;  Location:  [place] [facility] CV [facility];  Service: Cardiovascular;  Laterality: N/A;   NISSEN FUNDOPLICATION     Open [id number]   POLYPECTOMY     STOMACH SURGERY  10/05/2010   Oversewing of gastric ulcer.  Dr Brantley Stage   TOTAL HIP ARTHROPLASTY Left 04/24/2014   Procedure: LEFT TOTAL HIP ARTHROPLASTY;  Surgeon: Marchia Bond, MD;  Location: [place];  Service: Orthopedics;  Laterality: Left;   UPPER GASTROINTESTINAL ENDOSCOPY      Prior to Admission medications   Medication Sig Start Date End Date Taking? Authorizing Provider  amLODipine (NORVASC) 10 MG tablet Take 1 tablet (10 mg total) by mouth daily. 07/11/21  Yes Freada Bergeron, MD  aspirin EC 81 MG EC tablet Take 1 tablet  (81 mg total) by mouth daily. 10/08/17  Yes Domenic Polite, MD  carvedilol (COREG) 3.125 MG tablet Take 1 tablet (3.125 mg total) by mouth 2 (two) times daily with a meal. 05/02/21  Yes Baity, Coralie Keens, NP  clonazePAM (KLONOPIN) 0.5 MG tablet Take 1 tablet (0.5 mg total) by mouth at bedtime. 07/11/21  Yes Baity, Coralie Keens, NP  cyclobenzaprine (FLEXERIL) 10 MG tablet Take 1 tablet (10 mg total) by mouth 2 (two) times daily as needed. 07/01/21  Yes Baity, Coralie Keens, NP  gabapentin (NEURONTIN) 300 MG capsule Take 1 capsule (300 mg total) by mouth 2 (two) times daily. 05/29/21  Yes Baity, Coralie Keens, NP  lisinopril (ZESTRIL) 10 MG tablet Take 1 tablet (10 mg total) by mouth daily. 08/01/20  Yes Freada Bergeron, MD  ondansetron (ZOFRAN) 4 MG tablet TAKE 1 TABLET BY MOUTH EVERY 8 HOURS AS NEEDED FOR NAUSEA OR VOMITING. 02/06/21  Yes Baity, Coralie Keens, NP  pantoprazole (PROTONIX) 40 MG tablet Take 1 tablet (40 mg total) by mouth 2 (two) times daily. 04/29/21  Yes Baity, Coralie Keens, NP  rosuvastatin (CRESTOR) 20 MG tablet Take 1 tablet (20 mg total) by mouth daily. 07/11/21  Yes Freada Bergeron, MD  Semaglutide-Weight Management 2.4 MG/0.75ML SOAJ Inject 2.4 mg into the skin once a week. 09/19/20  Yes Freada Bergeron, MD  Topiramate ER (TROKENDI XR) 50 MG CP24 Take 1 capsule (50 mg) by mouth at bedtime. 07/03/21  Yes Jaffe, Adam R, DO  zolpidem (AMBIEN CR) 12.5 MG CR tablet Take 1 tablet (12.5 mg total) by mouth at bedtime. 07/01/21  Yes [name], NP  albuterol (VENTOLIN HFA) 108 (90 Base) MCG/ACT inhaler Inhale 1-2 puffs into the lungs every 4 (four) hours as needed for wheezing or shortness of breath 07/11/21   [name], NP  fluticasone (FLONASE) 50 MCG/ACT nasal spray Place 2 sprays into both nostrils daily as needed for allergies. 06/14/18   [name], MD  nortriptyline (PAMELOR) 25 MG capsule TAKE 1 CAPSULE BY MOUTH AT BEDTIME 01/01/20 01/03/20  Pieter Partridge, DO  simvastatin (ZOCOR) 10 MG tablet  Take 1 tablet (10 mg total) by mouth daily. 08/24/19 11/01/19  [name], NP    Current Outpatient Medications  Medication Sig Dispense Refill   amLODipine (NORVASC) 10 MG tablet Take 1 tablet (10 mg total) by mouth daily. 15 tablet 0   aspirin EC 81 MG EC tablet Take 1 tablet (81 mg total) by mouth daily.     carvedilol (COREG) 3.125 MG tablet Take 1 tablet (3.125 mg total) by mouth 2 (two) times daily with a meal. 180 tablet 1   clonazePAM (KLONOPIN) 0.5 MG tablet Take 1 tablet (0.5 mg total) by mouth  at bedtime. 30 tablet 0   cyclobenzaprine (FLEXERIL) 10 MG tablet Take 1 tablet (10 mg total) by mouth 2 (two) times daily as needed. 60 tablet 0   gabapentin (NEURONTIN) 300 MG capsule Take 1 capsule (300 mg total) by mouth 2 (two) times daily. 180 capsule 0   lisinopril (ZESTRIL) 10 MG tablet Take 1 tablet (10 mg total) by mouth daily. 30 tablet 11   ondansetron (ZOFRAN) 4 MG tablet TAKE 1 TABLET BY MOUTH EVERY 8 HOURS AS NEEDED FOR NAUSEA OR VOMITING. 20 tablet 1   pantoprazole (PROTONIX) 40 MG tablet Take 1 tablet (40 mg total) by mouth 2 (two) times daily. 60 tablet 2   rosuvastatin (CRESTOR) 20 MG tablet Take 1 tablet (20 mg total) by mouth daily. 15 tablet 0   Semaglutide-Weight Management 2.4 MG/0.75ML SOAJ Inject 2.4 mg into the skin once a week. 3 mL 11   Topiramate ER (TROKENDI XR) 50 MG CP24 Take 1 capsule (50 mg) by mouth at bedtime. 30 capsule 5   zolpidem (AMBIEN CR) 12.5 MG CR tablet Take 1 tablet (12.5 mg total) by mouth at bedtime. 30 tablet 0   albuterol (VENTOLIN HFA) 108 (90 Base) MCG/ACT inhaler Inhale 1-2 puffs into the lungs every 4 (four) hours as needed for wheezing or shortness of breath 18 g 0   fluticasone (FLONASE) 50 MCG/ACT nasal spray Place 2 sprays into both nostrils daily as needed for allergies. 16 g 11   Current Facility-Administered Medications  Medication Dose Route Frequency Provider Last Rate Last Admin   0.9 %  sodium chloride infusion  500 mL  Intravenous Once [place] Berkey, [name], MD        Allergies as of 07/15/2021 - Review Complete 07/15/2021  Allergen Reaction Noted   Dilaudid [hydromorphone hcl] Rash and Other (See Comments) 05/26/2011   Hydromorphone hcl Other (See Comments) and Rash 05/26/2011   Metoclopramide Other (See Comments), Rash, Swelling, and Hives 10/14/2010   Metoclopramide hcl Hives, Swelling, Rash, and Other (See Comments) 10/14/2010   Nsaids  01/22/2017   Benadryl [diphenhydramine]  11/11/2017    Family History  Problem Relation Age of Onset   Diabetes Mother    Liver disease Mother    Diabetes Father    Heart disease Father    Healthy Sister    Healthy Brother    Healthy Brother    Colon cancer Neg Hx    Stomach cancer Neg Hx    Rectal cancer Neg Hx    Esophageal cancer Neg Hx    Colon polyps Neg Hx    Prostate cancer Neg Hx     Social History   Socioeconomic History   Marital status: Single    Spouse name: Not on file   Number of children: 1   Years of education: 12   Highest education level: High school graduate  Occupational History   Occupation: Maintenance - [employer]  Tobacco Use   Smoking status: Never   Smokeless tobacco: Current    Types: Chew   Tobacco comments:    occ-Chew    None on 07/15/21  Vaping Use   Vaping Use: Never used  Substance and Sexual Activity   Alcohol use: Yes    Alcohol/week: 0.0 standard drinks of alcohol    Comment: 09/04/11 "very seldom"   Drug use: Never   Sexual activity: Yes  Other Topics Concern   Not on file  Social History Narrative   Lives with brother in a one story home.  Has one daughter.  Works in Theatre manager for [employer].  Education: high school.       Patient is left-handed.   Social Determinants of Health   Financial Resource Strain: Not on file  Food Insecurity: Not on file  Transportation Needs: Not on file  Physical Activity: Not on file  Stress: Not on file  Social Connections: Not on file  Intimate Partner Violence:  Not on file    Physical Exam: Vital signs in last 24 hours: '@BP'$  136/88   Pulse (!) 104   Temp 98.4 F (36.9 C) (Temporal)   Ht '5\' 9"'$  (1.753 m)   Wt 195 lb (88.5 kg)   SpO2 99%   BMI 28.80 kg/m  GEN: NAD EYE: Sclerae anicteric ENT: MMM CV: Non-tachycardic Pulm: CTA b/l GI: Soft, NT/ND NEURO:  Alert & Oriented x 3   Zenovia Jarred, MD [facility]  07/15/2021 2:32 PM

## 2021-07-15 NOTE — Progress Notes (Signed)
Pt's states no medical or surgical changes since previsit or office visit. 

## 2021-07-16 ENCOUNTER — Telehealth: Payer: Self-pay

## 2021-07-16 NOTE — Telephone Encounter (Signed)
  Follow up Call-     07/15/2021    2:05 PM  Call back number  Post procedure Call Back phone  # [phone]  Permission to leave phone message Yes     Patient questions:  Do you have a fever, pain , or abdominal swelling? No. Pain Score  0 *  Have you tolerated food without any problems? Yes.    Have you been able to return to your normal activities? Yes.    Do you have any questions about your discharge instructions: Diet   No. Medications  No. Follow up visit  No.  Do you have questions or concerns about your Care? No.  Actions: * If pain score is 4 or above: No action needed, pain <4.

## 2021-07-29 ENCOUNTER — Other Ambulatory Visit: Payer: Self-pay

## 2021-07-29 ENCOUNTER — Other Ambulatory Visit: Payer: Self-pay | Admitting: Internal Medicine

## 2021-07-29 ENCOUNTER — Other Ambulatory Visit (HOSPITAL_COMMUNITY): Payer: Self-pay

## 2021-07-29 MED ORDER — ROSUVASTATIN CALCIUM 20 MG PO TABS
20.0000 mg | ORAL_TABLET | Freq: Every day | ORAL | 0 refills | Status: DC
  Filled 2021-07-29: qty 15, 15d supply, fill #0

## 2021-07-29 MED ORDER — ZOLPIDEM TARTRATE ER 12.5 MG PO TBCR
12.5000 mg | EXTENDED_RELEASE_TABLET | Freq: Every day | ORAL | 0 refills | Status: DC
  Filled 2021-07-29: qty 30, 30d supply, fill #0

## 2021-07-29 MED ORDER — LISINOPRIL 10 MG PO TABS
10.0000 mg | ORAL_TABLET | Freq: Every day | ORAL | 0 refills | Status: DC
  Filled 2021-07-29: qty 15, 15d supply, fill #0

## 2021-07-29 MED ORDER — AMLODIPINE BESYLATE 10 MG PO TABS
10.0000 mg | ORAL_TABLET | Freq: Every day | ORAL | 0 refills | Status: DC
  Filled 2021-07-29: qty 15, 15d supply, fill #0

## 2021-07-29 NOTE — Telephone Encounter (Signed)
Requested medications are due for refill today.  yes  Requested medications are on the active medications list.  yes  Last refill. 07/01/2021 #30 0 refills  Future visit scheduled.   no  Notes to clinic.  Refill not delegated.    Requested Prescriptions  Pending Prescriptions Disp Refills   zolpidem (AMBIEN CR) 12.5 MG CR tablet 30 tablet 0    Sig: Take 1 tablet (12.5 mg total) by mouth at bedtime.     Not Delegated - Psychiatry:  Anxiolytics/Hypnotics Failed - 07/29/2021  9:20 AM      Failed - This refill cannot be delegated      Failed - Urine Drug Screen completed in last 360 days      Passed - Valid encounter within last 6 months    Recent Outpatient Visits           3 months ago Screening for colon cancer   [facility] [place], Salvadore Oxford, NP   11 months ago Type 2 diabetes mellitus without complication, without long-term current use of insulin (HCC)   [facility] [place], Salvadore Oxford, NP   1 year ago No-show for appointment   [facility] [place], Salvadore Oxford, NP

## 2021-08-01 ENCOUNTER — Other Ambulatory Visit: Payer: Self-pay

## 2021-08-01 ENCOUNTER — Other Ambulatory Visit: Payer: Self-pay | Admitting: Internal Medicine

## 2021-08-01 ENCOUNTER — Other Ambulatory Visit (HOSPITAL_COMMUNITY): Payer: Self-pay

## 2021-08-01 MED ORDER — CYCLOBENZAPRINE HCL 10 MG PO TABS
10.0000 mg | ORAL_TABLET | Freq: Two times a day (BID) | ORAL | 0 refills | Status: DC | PRN
  Filled 2021-08-01: qty 60, 30d supply, fill #0

## 2021-08-01 NOTE — Telephone Encounter (Signed)
Requested medication (s) are due for refill today: yes  Requested medication (s) are on the active medication list: yes  Last refill:  07/01/21 #60 with 0 RF  Future visit scheduled: no, seen 04/10/21  Notes to clinic:  This medication can not be delegated, please assess.        Requested Prescriptions  Pending Prescriptions Disp Refills   cyclobenzaprine (FLEXERIL) 10 MG tablet 60 tablet 0    Sig: Take 1 tablet (10 mg total) by mouth 2 (two) times daily as needed.     Not Delegated - Analgesics:  Muscle Relaxants Failed - 08/01/2021  8:36 AM      Failed - This refill cannot be delegated      Passed - Valid encounter within last 6 months    Recent Outpatient Visits           3 months ago Screening for colon cancer   [facility] [place], Coralie Keens, NP   11 months ago Type 2 diabetes mellitus without complication, without long-term current use of insulin [facility])   [facility] [place], Coralie Keens, NP   1 year ago No-show for appointment   [facility] [place], Coralie Keens, NP

## 2021-08-08 ENCOUNTER — Other Ambulatory Visit: Payer: Self-pay

## 2021-08-08 ENCOUNTER — Other Ambulatory Visit: Payer: Self-pay | Admitting: Pharmacist

## 2021-08-08 ENCOUNTER — Other Ambulatory Visit (HOSPITAL_COMMUNITY): Payer: Self-pay

## 2021-08-08 MED ORDER — SEMAGLUTIDE-WEIGHT MANAGEMENT 2.4 MG/0.75ML ~~LOC~~ SOAJ
2.4000 mg | SUBCUTANEOUS | 11 refills | Status: DC
  Filled 2021-08-08: qty 3, 28d supply, fill #0
  Filled 2021-09-09: qty 3, 28d supply, fill #1
  Filled 2021-09-29 – 2021-10-09 (×5): qty 3, 28d supply, fill #2
  Filled 2021-11-04: qty 3, 28d supply, fill #3
  Filled 2021-11-26: qty 3, 28d supply, fill #4
  Filled 2021-12-29: qty 3, 28d supply, fill #5
  Filled 2022-01-27: qty 3, 28d supply, fill #6
  Filled 2022-02-24: qty 3, 28d supply, fill #7
  Filled 2022-03-24: qty 3, 28d supply, fill #8
  Filled 2022-04-22: qty 3, 28d supply, fill #9
  Filled 2022-05-22: qty 3, 28d supply, fill #10
  Filled 2022-06-23: qty 3, 28d supply, fill #11

## 2021-08-11 ENCOUNTER — Other Ambulatory Visit: Payer: Self-pay | Admitting: Internal Medicine

## 2021-08-11 ENCOUNTER — Other Ambulatory Visit (HOSPITAL_COMMUNITY): Payer: Self-pay

## 2021-08-11 DIAGNOSIS — K219 Gastro-esophageal reflux disease without esophagitis: Secondary | ICD-10-CM

## 2021-08-12 ENCOUNTER — Other Ambulatory Visit (HOSPITAL_COMMUNITY): Payer: Self-pay

## 2021-08-12 MED ORDER — PANTOPRAZOLE SODIUM 40 MG PO TBEC
40.0000 mg | DELAYED_RELEASE_TABLET | Freq: Two times a day (BID) | ORAL | 2 refills | Status: DC
  Filled 2021-08-12: qty 60, 30d supply, fill #0
  Filled 2021-09-12: qty 60, 30d supply, fill #1
  Filled 2021-10-08: qty 60, 30d supply, fill #2

## 2021-08-12 MED ORDER — GABAPENTIN 300 MG PO CAPS
300.0000 mg | ORAL_CAPSULE | Freq: Two times a day (BID) | ORAL | 0 refills | Status: DC
  Filled 2021-08-12 – 2021-08-28 (×3): qty 180, 90d supply, fill #0

## 2021-08-12 MED ORDER — CLONAZEPAM 0.5 MG PO TABS
0.5000 mg | ORAL_TABLET | Freq: Every day | ORAL | 0 refills | Status: DC
  Filled 2021-08-12 – 2021-08-14 (×2): qty 30, 30d supply, fill #0

## 2021-08-12 NOTE — Telephone Encounter (Signed)
Requested Prescriptions  Pending Prescriptions Disp Refills  . pantoprazole (PROTONIX) 40 MG tablet 60 tablet 2    Sig: Take 1 tablet (40 mg total) by mouth 2 (two) times daily.     Gastroenterology: Proton Pump Inhibitors Passed - 08/11/2021  9:39 AM      Passed - Valid encounter within last 12 months    Recent Outpatient Visits          4 months ago Screening for colon cancer   [facility] [place], Coralie Keens, NP   11 months ago Type 2 diabetes mellitus without complication, without long-term current use of insulin [facility])   [facility], Coralie Keens, NP   1 year ago No-show for appointment   [facility] [place], Coralie Keens, NP             . gabapentin (NEURONTIN) 300 MG capsule 180 capsule 0    Sig: Take 1 capsule (300 mg total) by mouth 2 (two) times daily.     Neurology: Anticonvulsants - gabapentin Passed - 08/11/2021  9:39 AM      Passed - Cr in normal range and within 360 days    Creat  Date Value Ref Range Status  04/10/2021 1.14 0.70 - 1.30 mg/dL Final         Passed - Completed PHQ-2 or PHQ-9 in the last 360 days      Passed - Valid encounter within last 12 months    Recent Outpatient Visits          4 months ago Screening for colon cancer   [facility] [place], Coralie Keens, NP   11 months ago Type 2 diabetes mellitus without complication, without long-term current use of insulin ([place])   [facility], Coralie Keens, NP   1 year ago No-show for appointment   [facility] [place], Coralie Keens, NP             . clonazePAM (KLONOPIN) 0.5 MG tablet 30 tablet     Sig: Take 1 tablet (0.5 mg total) by mouth at bedtime.     Not Delegated - Psychiatry: Anxiolytics/Hypnotics 2 Failed - 08/11/2021  9:39 AM      Failed - This refill cannot be delegated      Failed - Urine Drug Screen completed in last 360 days      Passed - Patient is not pregnant      Passed - Valid encounter within  last 6 months    Recent Outpatient Visits          4 months ago Screening for colon cancer   [facility] [employer], Coralie Keens, NP   11 months ago Type 2 diabetes mellitus without complication, without long-term current use of insulin ([place])   [facility] [place], Coralie Keens, NP   1 year ago No-show for appointment   [facility] [place], Coralie Keens, NP

## 2021-08-12 NOTE — Telephone Encounter (Signed)
Requested medication (s) are due for refill today: yes  Requested medication (s) are on the active medication list: yes  Last refill:  07/11/21 #30  Future visit scheduled: no  Notes to clinic:  med not delegated to NT to RF   Requested Prescriptions  Pending Prescriptions Disp Refills   clonazePAM (KLONOPIN) 0.5 MG tablet 30 tablet     Sig: Take 1 tablet (0.5 mg total) by mouth at bedtime.     Not Delegated - Psychiatry: Anxiolytics/Hypnotics 2 Failed - 08/11/2021  9:39 AM      Failed - This refill cannot be delegated      Failed - Urine Drug Screen completed in last 360 days      Passed - Patient is not pregnant      Passed - Valid encounter within last 6 months    Recent Outpatient Visits           4 months ago Screening for colon cancer   [facility] [place], Coralie Keens, NP   11 months ago Type 2 diabetes mellitus without complication, without long-term current use of insulin ([place])   [facility], Coralie Keens, NP   1 year ago No-show for appointment   [facility] [place], Coralie Keens, NP              Signed Prescriptions Disp Refills   pantoprazole (PROTONIX) 40 MG tablet 60 tablet 2    Sig: Take 1 tablet (40 mg total) by mouth 2 (two) times daily.     Gastroenterology: Proton Pump Inhibitors Passed - 08/11/2021  9:39 AM      Passed - Valid encounter within last 12 months    Recent Outpatient Visits           4 months ago Screening for colon cancer   [facility] [place], Coralie Keens, NP   11 months ago Type 2 diabetes mellitus without complication, without long-term current use of insulin [facility])   [facility], Coralie Keens, NP   1 year ago No-show for appointment   [facility] [place], Coralie Keens, NP               gabapentin (NEURONTIN) 300 MG capsule 180 capsule 0    Sig: Take 1 capsule (300 mg total) by mouth 2 (two) times daily.     Neurology: Anticonvulsants -  gabapentin Passed - 08/11/2021  9:39 AM      Passed - Cr in normal range and within 360 days    Creat  Date Value Ref Range Status  04/10/2021 1.14 0.70 - 1.30 mg/dL Final         Passed - Completed PHQ-2 or PHQ-9 in the last 360 days      Passed - Valid encounter within last 12 months    Recent Outpatient Visits           4 months ago Screening for colon cancer   [facility] [place], Coralie Keens, NP   11 months ago Type 2 diabetes mellitus without complication, without long-term current use of insulin ([place])   [facility] [place], Coralie Keens, NP   1 year ago No-show for appointment   [facility] [place], Coralie Keens, NP

## 2021-08-14 ENCOUNTER — Other Ambulatory Visit (HOSPITAL_COMMUNITY): Payer: Self-pay

## 2021-08-28 ENCOUNTER — Other Ambulatory Visit (HOSPITAL_COMMUNITY): Payer: Self-pay

## 2021-08-28 ENCOUNTER — Other Ambulatory Visit: Payer: Self-pay | Admitting: Internal Medicine

## 2021-08-29 ENCOUNTER — Other Ambulatory Visit (HOSPITAL_COMMUNITY): Payer: Self-pay

## 2021-08-29 MED ORDER — ZOLPIDEM TARTRATE ER 12.5 MG PO TBCR
12.5000 mg | EXTENDED_RELEASE_TABLET | Freq: Every day | ORAL | 0 refills | Status: DC
  Filled 2021-08-29: qty 30, 30d supply, fill #0

## 2021-08-29 MED ORDER — CYCLOBENZAPRINE HCL 10 MG PO TABS
10.0000 mg | ORAL_TABLET | Freq: Two times a day (BID) | ORAL | 0 refills | Status: DC | PRN
  Filled 2021-08-29: qty 60, 30d supply, fill #0

## 2021-08-29 NOTE — Telephone Encounter (Signed)
Requested medication (s) are due for refill today: yes  Requested medication (s) are on the active medication list: yes  Last refill:  07/29/21 and 08/01/21  Future visit scheduled: yes  Notes to clinic:  Unable to refill per protocol, cannot delegate.      Requested Prescriptions  Pending Prescriptions Disp Refills   zolpidem (AMBIEN CR) 12.5 MG CR tablet 30 tablet 0    Sig: Take 1 tablet (12.5 mg total) by mouth at bedtime.     Not Delegated - Psychiatry:  Anxiolytics/Hypnotics Failed - 08/28/2021  8:57 AM      Failed - This refill cannot be delegated      Failed - Urine Drug Screen completed in last 360 days      Passed - Valid encounter within last 6 months    Recent Outpatient Visits           4 months ago Screening for colon cancer   [place], Coralie Keens, NP   1 year ago Type 2 diabetes mellitus without complication, without long-term current use of insulin ([place])   [facility], Coralie Keens, NP   1 year ago No-show for appointment   [facility] [place], Coralie Keens, NP               cyclobenzaprine (FLEXERIL) 10 MG tablet 60 tablet 0    Sig: Take 1 tablet (10 mg total) by mouth 2 (two) times daily as needed.     Not Delegated - Analgesics:  Muscle Relaxants Failed - 08/28/2021  8:57 AM      Failed - This refill cannot be delegated      Passed - Valid encounter within last 6 months    Recent Outpatient Visits           4 months ago Screening for colon cancer   [facility] [place], Coralie Keens, NP   1 year ago Type 2 diabetes mellitus without complication, without long-term current use of insulin [facility])   [facility], Coralie Keens, NP   1 year ago No-show for appointment   [facility] [place], Coralie Keens, NP

## 2021-09-04 ENCOUNTER — Ambulatory Visit (INDEPENDENT_AMBULATORY_CARE_PROVIDER_SITE_OTHER): Payer: No Typology Code available for payment source | Admitting: Neurology

## 2021-09-04 DIAGNOSIS — G992 Myelopathy in diseases classified elsewhere: Secondary | ICD-10-CM

## 2021-09-04 DIAGNOSIS — I639 Cerebral infarction, unspecified: Secondary | ICD-10-CM

## 2021-09-04 DIAGNOSIS — R519 Headache, unspecified: Secondary | ICD-10-CM

## 2021-09-04 DIAGNOSIS — I10 Essential (primary) hypertension: Secondary | ICD-10-CM

## 2021-09-04 DIAGNOSIS — G5603 Carpal tunnel syndrome, bilateral upper limbs: Secondary | ICD-10-CM

## 2021-09-04 DIAGNOSIS — G5622 Lesion of ulnar nerve, left upper limb: Secondary | ICD-10-CM

## 2021-09-04 DIAGNOSIS — E119 Type 2 diabetes mellitus without complications: Secondary | ICD-10-CM

## 2021-09-04 NOTE — Procedures (Signed)
[facility]  [address], [place]  [place], [place] [address] Tel: [phone] Fax:  [phone] Test Date:  09/04/2021  Patient: [name] DOB: 1971/01/25 Physician: Narda Amber, DO  Sex: Male Height: '5\' 9"'$  Ref Phys: Metta Clines, D.O.  ID#: [id number]   Technician:    Patient Complaints: This is a 51 year old man with history of bilateral CTS release and cervical surgery referred for evaluation of paresthesias and pain.  NCV & EMG Findings: Extensive electrodiagnostic testing of the right upper extremity and additional studies of the left shows:  Left median sensory response shows reduced amplitude amplitude (13.5 V); latency is normal and improved when compared to prior study on 01/20/2018.  Left ulnar sensory response shows prolonged latency (3.2 ms), unchanged.  Right median, mixed palmar, and ulnar sensory responses are within normal limits; specifically, median latency and amplitude has improved.   Bilateral median and right ulnar motor responses are within normal limits.  Left ulnar motor response continues to show mild slowed conduction velocity across the elbow (A Elbow-B Elbow, 48 m/s).   Chronic motor axonal loss changes are seen affecting the right C5-6 myotome, left first dorsal interosseous muscle and left abductor pollicis brevis muscle.  There is no evidence of accompanying active denervation.    Impression: As compared to prior study on 01/20/2018, there is no longer evidence of carpal tunnel syndrome on the right and findings on the left have improved.   Left ulnar neuropathy with slowing across the elbow, purely demyelinating, mild and unchanged. Interval development of chronic C5-6 radiculopathy affecting the right upper extremity, mild.  ___________________________ Narda Amber, DO    Nerve Conduction Studies Anti Sensory Summary Table   Stim Site NR Peak (ms) Norm Peak (ms) P-T Amp (V) Norm P-T Amp  Left Median Anti Sensory (2nd Digit)   33C  Wrist    3.4 <3.6 13.5 >15  Right Median Anti Sensory (2nd Digit)  33C  Wrist    3.3 <3.6 17.7 >15  Left Ulnar Anti Sensory (5th Digit)  33C  Wrist    3.2 <3.1 11.9 >10  Right Ulnar Anti Sensory (5th Digit)  33C  Wrist    2.9 <3.1 20.0 >10   Motor Summary Table   Stim Site NR Onset (ms) Norm Onset (ms) O-P Amp (mV) Norm O-P Amp Site1 Site2 Delta-0 (ms) Dist (cm) Vel (m/s) Norm Vel (m/s)  Left Median Motor (Abd Poll Brev)  33C  Wrist    3.7 <4.0 9.7 >6 Elbow Wrist 5.1 29.0 57 >50  Elbow    8.8  9.5         Right Median Motor (Abd Poll Brev)  33C  Wrist    3.3 <4.0 10.1 >6 Elbow Wrist 5.5 29.0 53 >50  Elbow    8.8  9.7         Left Ulnar Motor (Abd Dig Minimi)  33C  Wrist    2.3 <3.1 10.1 >7 B Elbow Wrist 3.8 23.0 61 >50  B Elbow    6.1  10.0  A Elbow B Elbow 2.1 10.0 48 >50  A Elbow    8.2  10.0         Right Ulnar Motor (Abd Dig Minimi)  33C  Wrist    1.9 <3.1 9.8 >7 B Elbow Wrist 4.1 24.0 59 >50  B Elbow    6.0  8.8  A Elbow B Elbow 1.9 10.0 53 >50  A Elbow    7.9  8.5  Comparison Summary Table   Stim Site NR Peak (ms) Norm Peak (ms) P-T Amp (V) Site1 Site2 Delta-P (ms) Norm Delta (ms)  Right Median/Ulnar Palm Comparison (Wrist - 8cm)  33C  Median Palm    2.0 <2.2 59.4 Median Palm Ulnar Palm 0.2   Ulnar Palm    1.8 <2.2 34.0       EMG   Side Muscle Ins Act Fibs Psw Fasc Number Recrt Dur Dur. Amp Amp. Poly Poly. Comment  Left 1stDorInt Nml Nml Nml Nml 1- Rapid Some 1+ Some 1+ Some 1+ N/A  Left ABD Dig Min Nml Nml Nml Nml Nml Nml Nml Nml Nml Nml Nml Nml N/A  Left Abd Poll Brev Nml Nml Nml Nml 1- Rapid Some 1+ Some 1+ Some 1+ N/A  Left Biceps Nml Nml Nml Nml Nml Nml Nml Nml Nml Nml Nml Nml N/A  Left Deltoid Nml Nml Nml Nml Nml Nml Nml Nml Nml Nml Nml Nml N/A  Left FlexCarpiUln Nml Nml Nml Nml Nml Nml Nml Nml Nml Nml Nml Nml N/A  Left PronatorTeres Nml Nml Nml Nml Nml Nml Nml Nml Nml Nml Nml Nml N/A  Left Triceps Nml Nml Nml Nml Nml Nml Nml Nml Nml Nml  Nml Nml N/A  Right 1stDorInt Nml Nml Nml Nml Nml Nml Nml Nml Nml Nml Nml Nml N/A  Right Abd Poll Brev Nml Nml Nml Nml Nml Nml Nml Nml Nml Nml Nml Nml N/A  Right Biceps Nml Nml Nml Nml 1- Rapid Some 1+ Some 1+ Some 1+ N/A  Right Deltoid Nml Nml Nml Nml 1- Rapid Some 1+ Some 1+ Some 1+ N/A  Right PronatorTeres Nml Nml Nml Nml Nml Nml Nml Nml Nml Nml Nml Nml N/A  Right Triceps Nml Nml Nml Nml Nml Nml Nml Nml Nml Nml Nml Nml N/A      Waveforms:

## 2021-09-09 ENCOUNTER — Other Ambulatory Visit (HOSPITAL_COMMUNITY): Payer: Self-pay

## 2021-09-12 ENCOUNTER — Other Ambulatory Visit: Payer: Self-pay | Admitting: Internal Medicine

## 2021-09-12 ENCOUNTER — Other Ambulatory Visit (HOSPITAL_COMMUNITY): Payer: Self-pay

## 2021-09-12 MED ORDER — CLONAZEPAM 0.5 MG PO TABS
0.5000 mg | ORAL_TABLET | Freq: Every day | ORAL | 0 refills | Status: DC
  Filled 2021-09-12: qty 30, 30d supply, fill #0

## 2021-09-12 MED ORDER — ONDANSETRON HCL 4 MG PO TABS
4.0000 mg | ORAL_TABLET | Freq: Three times a day (TID) | ORAL | 1 refills | Status: DC | PRN
  Filled 2021-09-12: qty 20, 7d supply, fill #0
  Filled 2021-10-15: qty 20, 7d supply, fill #1

## 2021-09-12 NOTE — Telephone Encounter (Signed)
Requested medication (s) are due for refill today: yes  Requested medication (s) are on the active medication list: yes  Last refill:  zofran- 02/06/21 #20 1 refills, klonopin - 08/12/21 #30 0 refills  Future visit scheduled: no  Notes to clinic:  not delegated per protocol. Do you want to refill?     Requested Prescriptions  Pending Prescriptions Disp Refills   ondansetron (ZOFRAN) 4 MG tablet 20 tablet 1    Sig: TAKE 1 TABLET BY MOUTH EVERY 8 HOURS AS NEEDED FOR NAUSEA OR VOMITING.     Not Delegated - Gastroenterology: Antiemetics - ondansetron Failed - 09/12/2021 11:19 AM      Failed - This refill cannot be delegated      Passed - AST in normal range and within 360 days    AST  Date Value Ref Range Status  04/10/2021 21 10 - 35 U/L Final         Passed - ALT in normal range and within 360 days    ALT  Date Value Ref Range Status  04/10/2021 25 9 - 46 U/L Final         Passed - Valid encounter within last 6 months    Recent Outpatient Visits           5 months ago Screening for colon cancer   [place], Coralie Keens, NP   1 year ago Type 2 diabetes mellitus without complication, without long-term current use of insulin ([place])   [facility], Coralie Keens, NP   1 year ago No-show for appointment   [facility] [place], Coralie Keens, NP               clonazePAM (KLONOPIN) 0.5 MG tablet 30 tablet 0    Sig: Take 1 tablet (0.5 mg total) by mouth at bedtime.     Not Delegated - Psychiatry: Anxiolytics/Hypnotics 2 Failed - 09/12/2021 11:19 AM      Failed - This refill cannot be delegated      Failed - Urine Drug Screen completed in last 360 days      Passed - Patient is not pregnant      Passed - Valid encounter within last 6 months    Recent Outpatient Visits           5 months ago Screening for colon cancer   [place], Coralie Keens, NP   1 year ago Type 2 diabetes mellitus without  complication, without long-term current use of insulin ([place])   [facility], Coralie Keens, NP   1 year ago No-show for appointment   [facility] [place], Coralie Keens, NP

## 2021-09-29 ENCOUNTER — Other Ambulatory Visit (HOSPITAL_COMMUNITY): Payer: Self-pay

## 2021-09-29 ENCOUNTER — Other Ambulatory Visit: Payer: Self-pay | Admitting: Internal Medicine

## 2021-09-30 ENCOUNTER — Other Ambulatory Visit (HOSPITAL_COMMUNITY): Payer: Self-pay

## 2021-09-30 MED ORDER — CYCLOBENZAPRINE HCL 10 MG PO TABS
10.0000 mg | ORAL_TABLET | Freq: Two times a day (BID) | ORAL | 0 refills | Status: DC | PRN
  Filled 2021-09-30: qty 60, 30d supply, fill #0

## 2021-09-30 MED ORDER — ZOLPIDEM TARTRATE ER 12.5 MG PO TBCR
12.5000 mg | EXTENDED_RELEASE_TABLET | Freq: Every day | ORAL | 0 refills | Status: DC
  Filled 2021-09-30: qty 30, 30d supply, fill #0

## 2021-09-30 NOTE — Telephone Encounter (Signed)
Requested medication (s) are due for refill today: yes  Requested medication (s) are on the active medication list: yes  Last refill:  Flexeril 08/29/21 #60 with 0 RF, Ambien 08/29/21 #30 with 0 RF  Future visit scheduled: no, seen 04/10/21  Notes to clinic:  This medication can not be delegated, please assess.        Requested Prescriptions  Pending Prescriptions Disp Refills   zolpidem (AMBIEN CR) 12.5 MG CR tablet 30 tablet 0    Sig: Take 1 tablet (12.5 mg total) by mouth at bedtime.     Not Delegated - Psychiatry:  Anxiolytics/Hypnotics Failed - 09/29/2021  9:04 AM      Failed - This refill cannot be delegated      Failed - Urine Drug Screen completed in last 360 days      Passed - Valid encounter within last 6 months    Recent Outpatient Visits           5 months ago Screening for colon cancer   [place], Coralie Keens, NP   1 year ago Type 2 diabetes mellitus without complication, without long-term current use of insulin ([place])   [facility], Coralie Keens, NP   1 year ago No-show for appointment   [facility] [place], Coralie Keens, NP               cyclobenzaprine (FLEXERIL) 10 MG tablet 60 tablet 0    Sig: Take 1 tablet (10 mg total) by mouth 2 (two) times daily as needed.     Not Delegated - Analgesics:  Muscle Relaxants Failed - 09/29/2021  9:04 AM      Failed - This refill cannot be delegated      Passed - Valid encounter within last 6 months    Recent Outpatient Visits           5 months ago Screening for colon cancer   [facility] [place], Coralie Keens, NP   1 year ago Type 2 diabetes mellitus without complication, without long-term current use of insulin [facility])   [facility], Coralie Keens, NP   1 year ago No-show for appointment   [facility] [place], Coralie Keens, NP

## 2021-10-01 ENCOUNTER — Other Ambulatory Visit (HOSPITAL_COMMUNITY): Payer: Self-pay

## 2021-10-02 ENCOUNTER — Other Ambulatory Visit (HOSPITAL_COMMUNITY): Payer: Self-pay

## 2021-10-03 ENCOUNTER — Other Ambulatory Visit (HOSPITAL_COMMUNITY): Payer: Self-pay

## 2021-10-03 ENCOUNTER — Telehealth: Payer: Self-pay | Admitting: Pharmacist

## 2021-10-03 NOTE — Telephone Encounter (Signed)
PA renewal for [facility] submitted Key: [id number]

## 2021-10-08 ENCOUNTER — Other Ambulatory Visit (HOSPITAL_COMMUNITY): Payer: Self-pay

## 2021-10-08 ENCOUNTER — Other Ambulatory Visit: Payer: Self-pay | Admitting: Internal Medicine

## 2021-10-09 ENCOUNTER — Other Ambulatory Visit (HOSPITAL_COMMUNITY): Payer: Self-pay

## 2021-10-09 MED ORDER — ALBUTEROL SULFATE HFA 108 (90 BASE) MCG/ACT IN AERS
1.0000 | INHALATION_SPRAY | RESPIRATORY_TRACT | 0 refills | Status: DC | PRN
  Filled 2021-10-09: qty 6.7, 17d supply, fill #0

## 2021-10-09 NOTE — Telephone Encounter (Signed)
HY[employer]OO[employer] PA approved through 10/08/22

## 2021-10-09 NOTE — Telephone Encounter (Signed)
Requested Prescriptions  Pending Prescriptions Disp Refills  . albuterol (VENTOLIN HFA) 108 (90 Base) MCG/ACT inhaler 18 g 0    Sig: Inhale 1-2 puffs into the lungs every 4 (four) hours as needed for wheezing or shortness of breath     Pulmonology:  Beta Agonists 2 Passed - 10/08/2021 11:34 AM      Passed - Last BP in normal range    BP Readings from Last 1 Encounters:  07/15/21 101/60         Passed - Last Heart Rate in normal range    Pulse Readings from Last 1 Encounters:  07/15/21 83         Passed - Valid encounter within last 12 months    Recent Outpatient Visits          6 months ago Screening for colon cancer   [place], Coralie Keens, NP   1 year ago Type 2 diabetes mellitus without complication, without long-term current use of insulin ([place])   [facility] [place], Coralie Keens, NP   1 year ago No-show for appointment   [facility] [place], Coralie Keens, NP

## 2021-10-15 ENCOUNTER — Other Ambulatory Visit: Payer: Self-pay | Admitting: *Deleted

## 2021-10-15 ENCOUNTER — Other Ambulatory Visit: Payer: Self-pay

## 2021-10-15 ENCOUNTER — Other Ambulatory Visit: Payer: Self-pay | Admitting: Internal Medicine

## 2021-10-15 ENCOUNTER — Other Ambulatory Visit (HOSPITAL_COMMUNITY): Payer: Self-pay

## 2021-10-16 ENCOUNTER — Other Ambulatory Visit (HOSPITAL_COMMUNITY): Payer: Self-pay

## 2021-10-16 MED ORDER — CLONAZEPAM 0.5 MG PO TABS
0.5000 mg | ORAL_TABLET | Freq: Every day | ORAL | 0 refills | Status: DC
  Filled 2021-10-16 – 2021-10-20 (×2): qty 30, 30d supply, fill #0

## 2021-10-16 MED ORDER — CARVEDILOL 3.125 MG PO TABS
3.1250 mg | ORAL_TABLET | Freq: Two times a day (BID) | ORAL | 1 refills | Status: DC
  Filled 2021-10-16: qty 180, 90d supply, fill #0
  Filled 2022-03-24: qty 180, 90d supply, fill #1

## 2021-10-16 NOTE — Telephone Encounter (Signed)
Scheduled him for 6 month check with Webb Silversmith, NP for 10/21/2021 at 3:00.    Refills sent to practice due to being a controlled medication that is non delegated.

## 2021-10-17 ENCOUNTER — Other Ambulatory Visit (HOSPITAL_COMMUNITY): Payer: Self-pay

## 2021-10-20 ENCOUNTER — Other Ambulatory Visit (HOSPITAL_COMMUNITY): Payer: Self-pay

## 2021-10-21 ENCOUNTER — Ambulatory Visit (INDEPENDENT_AMBULATORY_CARE_PROVIDER_SITE_OTHER): Payer: No Typology Code available for payment source | Admitting: Internal Medicine

## 2021-10-21 ENCOUNTER — Encounter: Payer: Self-pay | Admitting: Internal Medicine

## 2021-10-21 VITALS — BP 126/78 | HR 105 | Temp 96.8°F | Wt 199.0 lb

## 2021-10-21 DIAGNOSIS — E119 Type 2 diabetes mellitus without complications: Secondary | ICD-10-CM | POA: Diagnosis not present

## 2021-10-21 DIAGNOSIS — I6300 Cerebral infarction due to thrombosis of unspecified precerebral artery: Secondary | ICD-10-CM | POA: Diagnosis not present

## 2021-10-21 DIAGNOSIS — Z6829 Body mass index (BMI) 29.0-29.9, adult: Secondary | ICD-10-CM

## 2021-10-21 DIAGNOSIS — G5603 Carpal tunnel syndrome, bilateral upper limbs: Secondary | ICD-10-CM

## 2021-10-21 DIAGNOSIS — R519 Headache, unspecified: Secondary | ICD-10-CM

## 2021-10-21 DIAGNOSIS — I200 Unstable angina: Secondary | ICD-10-CM

## 2021-10-21 DIAGNOSIS — E663 Overweight: Secondary | ICD-10-CM

## 2021-10-21 DIAGNOSIS — F419 Anxiety disorder, unspecified: Secondary | ICD-10-CM

## 2021-10-21 DIAGNOSIS — F5101 Primary insomnia: Secondary | ICD-10-CM

## 2021-10-21 DIAGNOSIS — I10 Essential (primary) hypertension: Secondary | ICD-10-CM | POA: Diagnosis not present

## 2021-10-21 DIAGNOSIS — K2100 Gastro-esophageal reflux disease with esophagitis, without bleeding: Secondary | ICD-10-CM

## 2021-10-21 DIAGNOSIS — E7800 Pure hypercholesterolemia, unspecified: Secondary | ICD-10-CM

## 2021-10-21 LAB — POCT GLYCOSYLATED HEMOGLOBIN (HGB A1C): Hemoglobin A1C: 5.6 % (ref 4.0–5.6)

## 2021-10-21 NOTE — Assessment & Plan Note (Signed)
POCT A1c is 5.7% Urine microalbumin today Continue Wegovy Encouraged low-carb diet and exercise for weight loss Advised him to set up an appointment for his eye exam Routine foot exam Flu shot today Pneumovax UTD Encouraged him to get his COVID booster

## 2021-10-21 NOTE — Assessment & Plan Note (Signed)
Recent EMG reviewed He is unsure of cyclobenzaprine or gabapentin are helping, he is considering weaning off these medications

## 2021-10-21 NOTE — Assessment & Plan Note (Signed)
Encourage diet and exercise for weight loss 

## 2021-10-21 NOTE — Assessment & Plan Note (Signed)
Continue Topamax He will continue to follow with neurology

## 2021-10-21 NOTE — Assessment & Plan Note (Signed)
Continue Ambien as needed 

## 2021-10-21 NOTE — Assessment & Plan Note (Signed)
Controlled off meds He is going to talk to cardiology about if he needs to restart this Reinforced DASH diet and exercise for weight loss C-Met today

## 2021-10-21 NOTE — Assessment & Plan Note (Signed)
He is currently not taking antihypertensive or cholesterol-lowering medication Encouraged him to consume a low fat diet and exercise for weight loss C-Met and lipid profile today He continues to take aspirin 

## 2021-10-21 NOTE — Assessment & Plan Note (Signed)
Continue clonazepam as needed. ?

## 2021-10-21 NOTE — Assessment & Plan Note (Signed)
C-Met and lipid profile today Not taking rosuvastatin Encouraged him to consume a low-fat diet 

## 2021-10-21 NOTE — Progress Notes (Signed)
Subjective:    Patient ID: [name], male    DOB: 1970/09/26, 51 y.o.   MRN: [id number]  HPI  Patient presents to clinic today for 56-monthfollow-up of chronic conditions.  HTN: His BP today is 126/78.  He is  not taking Amlodipine, Lisinopril and Carvedilol as prescribed.  ECG from 04/2020 reviewed.  HLD with Unstable Angina, History of Stroke: No residual effect.  His last LDL was 90, triglycerides 101.  He is not taking Amlodipine, Lisinopril, Carvedilol and Rosuvastatin as prescribed.  He is taking Aspirin daily.  He tries to consume low-fat diet.  GERD: Triggered by tomato-based food and spicy foods.  He takes Pantoprazole as needed and denies breakthrough.  Upper GI from 09/2016 reviewed.  DM2: His last A1c was 5.7%, 04/2021.  He is taking Wegovy as prescribed.  He does not check his sugars.  He checks his feet routinely.  His last eye exam was >1-year ago.  Flu 10/2019.  Pneumovax 04/2021.  [facility].  Anxiety: Chronic, managed on, Clonazepam as needed.  He is not currently seeing a therapist.  He denies depression, SI/HI.  Insomnia: He has difficulty falling and staying asleep.  He is taking Ambien as prescribed.  Sleep study from 08/2014 reviewed.  Frequent Headaches: These occur a few times per month.  He is taking Topamax as prescribed.  He follows with neurology.  Bilateral Carpal Tunnel: Status post surgery.  He takes Cyclobenzaprine, Gabapentin as needed with some relief of symptoms.  Review of Systems     Past Medical History:  Diagnosis Date   Anxiety    takes Xanax daily prn, panic attacks   Chronic gastric ulcer with bleeding s/p suture repair 2012 09/29/2010   Head 3 EGD which failed to stop the bleeding. Had exploratory laparotomy on 10/04/2010. Possible cause of the gastric ulcer was NSAID use.    Clotting disorder (HCC)    Constipation    related to pain meds   Depression    Diabetes mellitus without complication (HCC)    Dizziness    occasionally    Dyspnea    walking a bit   Erosive esophagitis    External hemorrhoid, bleeding    Gastric ulcer    history of   GERD (gastroesophageal reflux disease)    takes Protonix bid   Hiatal hernia    History of blood clots 2012   in abdomen   History of blood transfusion 2012   Hyperlipidemia    Hypertension    Incisional hernia s/p open repair w mesh Aug 2013 05/26/2011   Joint pain    knees   Nocturia    depends on amount of fluid he drinks   Pneumonia 2018   Pre-diabetes    Primary localized osteoarthritis of left hip 04/24/2014   Serrated polyp of colon    Sleep apnea    no cpap  mild no cpap needed   [facility]    [place] 04[place]   Upper GI bleed    Ventral hernia     Current Outpatient Medications  Medication Sig Dispense Refill   albuterol (VENTOLIN HFA) 108 (90 Base) MCG/ACT inhaler Inhale 1-2 puffs into the lungs every 4 (four) hours as needed for wheezing or shortness of breath 6.7 g 0   amLODipine (NORVASC) 10 MG tablet Take 1 tablet (10 mg total) by mouth daily. Please call our to schedule an overdue appointment with Dr. PJohney Framebefore anymore refills. [phone] Thank you. 3rd attempt.  15 tablet 0   aspirin EC 81 MG EC tablet Take 1 tablet (81 mg total) by mouth daily.     carvedilol (COREG) 3.125 MG tablet Take 1 tablet (3.125 mg total) by mouth 2 (two) times daily with a meal. 180 tablet 1   clonazePAM (KLONOPIN) 0.5 MG tablet Take 1 tablet (0.5 mg total) by mouth at bedtime. 30 tablet 0   cyclobenzaprine (FLEXERIL) 10 MG tablet Take 1 tablet (10 mg total) by mouth 2 (two) times daily as needed. 60 tablet 0   fluticasone (FLONASE) 50 MCG/ACT nasal spray Place 2 sprays into both nostrils daily as needed for allergies. 16 g 11   gabapentin (NEURONTIN) 300 MG capsule Take 1 capsule (300 mg total) by mouth 2 (two) times daily. 180 capsule 0   lisinopril (ZESTRIL) 10 MG tablet Take 1 tablet (10 mg total) by mouth daily. Please call our to  schedule an overdue appointment with Dr. Johney Frame before anymore refills. [phone]. Thank you. 3rd and final attempt. 15 tablet 0   ondansetron (ZOFRAN) 4 MG tablet Take 1 tablet (4 mg total) by mouth every 8 (eight) hours as needed for nausea or vomiting. 20 tablet 1   pantoprazole (PROTONIX) 40 MG tablet Take 1 tablet (40 mg total) by mouth 2 (two) times daily. 60 tablet 2   rosuvastatin (CRESTOR) 20 MG tablet Take 1 tablet (20 mg total) by mouth daily. Please call our to schedule an overdue appointment with Dr. Johney Frame before anymore refills. [phone]. Thank you. 3rd and final attempt. 15 tablet 0   Semaglutide-Weight Management 2.4 MG/0.75ML SOAJ Inject 2.4 mg into the skin once a week. 3 mL 11   Topiramate ER (TROKENDI XR) 50 MG CP24 Take 1 capsule (50 mg) by mouth at bedtime. 30 capsule 5   zolpidem (AMBIEN CR) 12.5 MG CR tablet Take 1 tablet (12.5 mg total) by mouth at bedtime. 30 tablet 0   No current facility-administered medications for this visit.    Allergies  Allergen Reactions   Dilaudid [Hydromorphone Hcl] Rash and Other (See Comments)    Shakey   Hydromorphone Hcl Other (See Comments) and Rash    Shakey   Metoclopramide Other (See Comments), Rash, Swelling and Hives    Swelling in lips   Metoclopramide Hcl Hives, Swelling, Rash and Other (See Comments)    Swelling in lips   Nsaids     H/o bleeding gastric ulcers   Benadryl [Diphenhydramine]     Causes patient to become hyper and pace    Family History  Problem Relation Age of Onset   Diabetes Mother    Liver disease Mother    Diabetes Father    Heart disease Father    Healthy Sister    Healthy Brother    Healthy Brother    Colon cancer Neg Hx    Stomach cancer Neg Hx    Rectal cancer Neg Hx    Esophageal cancer Neg Hx    Colon polyps Neg Hx    Prostate cancer Neg Hx     Social History   Socioeconomic History   Marital status: Single    Spouse name: Not on file   Number of children: 1    Years of education: 12   Highest education level: High school graduate  Occupational History   Occupation: Maintenance - [employer]  Tobacco Use   Smoking status: Never   Smokeless tobacco: Current    Types: Chew   Tobacco comments:    occ-Chew  None on 07/15/21  Vaping Use   Vaping Use: Never used  Substance and Sexual Activity   Alcohol use: Yes    Alcohol/week: 0.0 standard drinks of alcohol    Comment: 09/04/11 "very seldom"   Drug use: Never   Sexual activity: Yes  Other Topics Concern   Not on file  Social History Narrative   Lives with brother in a one story home.  Has one daughter.  Works in Theatre manager for [employer].  Education: high school.       Patient is left-handed.   Social Determinants of Health   Financial Resource Strain: Not on file  Food Insecurity: Not on file  Transportation Needs: Not on file  Physical Activity: Not on file  Stress: Not on file  Social Connections: Not on file  Intimate Partner Violence: Not on file     Constitutional: Patient reports intermittent headaches.  Denies fever, malaise, fatigue, or abrupt weight changes.  HEENT: Denies eye pain, eye redness, ear pain, ringing in the ears, wax buildup, runny nose, nasal congestion, bloody nose, or sore throat. Respiratory: Denies difficulty breathing, shortness of breath, cough or sputum production.   Cardiovascular: Denies chest pain, chest tightness, palpitations or swelling in the hands or feet.  Gastrointestinal: Denies abdominal pain, bloating, constipation, diarrhea or blood in the stool.  GU: Denies urgency, frequency, pain with urination, burning sensation, blood in urine, odor or discharge. Musculoskeletal: Denies decrease in range of motion, difficulty with gait, muscle pain or joint pain and swelling.  Skin: Denies redness, rashes, lesions or ulcercations.  Neurological: Patient reports insomnia, paresthesia of hands.  Denies dizziness, difficulty with memory, difficulty with  speech or problems with balance and coordination.  Psych: Patient has a history of anxiety.  Denies depression, SI/HI.  No other specific complaints in a complete review of systems (except as listed in HPI above).  Objective:   Physical Exam  BP 126/78 (BP Location: Left Arm, Patient Position: Sitting, Cuff Size: Normal)   Pulse (!) 105   Temp (!) 96.8 F (36 C) (Temporal)   Wt 199 lb (90.3 kg)   SpO2 97%   BMI 29.39 kg/m   Wt Readings from Last 3 Encounters:  07/15/21 195 lb (88.5 kg)  07/03/21 197 lb (89.4 kg)  06/24/21 195 lb (88.5 kg)    General: Appears his stated age,overweight, in NAD. Skin: Warm, clammy and intact. No ulcerations noted. HEENT: Head: normal shape and size; Eyes: sclera white, no icterus, conjunctiva pink, PERRLA and EOMs intact;  Cardiovascular: Tachycardic with normal rhythm. S1,S2 noted.  No murmur, rubs or gallops noted. No JVD or BLE edema. No carotid bruits noted. Pulmonary/Chest: Normal effort and positive vesicular breath sounds. No respiratory distress. No wheezes, rales or ronchi noted.  Abdomen:  Normal bowel sounds.  Musculoskeletal: No difficulty with gait.  Neurological: Alert and oriented.  Psychiatric: Mood and affect normal. Behavior is normal. Judgment and thought content normal.    BMET    Component Value Date/Time   NA 141 04/10/2021 1541   NA 142 04/26/2020 0849   K 5.0 04/10/2021 1541   CL 105 04/10/2021 1541   CO2 27 04/10/2021 1541   GLUCOSE 85 04/10/2021 1541   BUN 19 04/10/2021 1541   BUN 18 04/26/2020 0849   CREATININE 1.14 04/10/2021 1541   CALCIUM 10.0 04/10/2021 1541   GFRNONAA >60 04/11/2020 0320   GFRAA 96 12/12/2019 1134    Lipid Panel     Component Value Date/Time   CHOL  174 04/10/2021 1541   CHOL 112 11/28/2020 0746   TRIG 101 04/10/2021 1541   HDL 65 04/10/2021 1541   HDL 60 11/28/2020 0746   CHOLHDL 2.7 04/10/2021 1541   VLDL 32 04/10/2020 0402   LDLCALC 90 04/10/2021 1541    CBC    Component  Value Date/Time   WBC 8.0 04/10/2021 1541   RBC 4.31 04/10/2021 1541   HGB 13.1 (L) 04/10/2021 1541   HCT 40.1 04/10/2021 1541   PLT 331 04/10/2021 1541   MCV 93.0 04/10/2021 1541   MCH 30.4 04/10/2021 1541   MCHC 32.7 04/10/2021 1541   RDW 12.9 04/10/2021 1541   LYMPHSABS 2.4 04/09/2020 1553   MONOABS 1.0 04/09/2020 1553   EOSABS 0.2 04/09/2020 1553   BASOSABS 0.1 04/09/2020 1553    Hgb A1C Lab Results  Component Value Date   HGBA1C 5.7 (H) 04/10/2021           Assessment & Plan:    RTC in 6 months for annual exam Webb Silversmith, NP

## 2021-10-21 NOTE — Patient Instructions (Signed)

## 2021-10-21 NOTE — Assessment & Plan Note (Addendum)
Encouraged him to avoid foods that trigger his reflux Weight loss as this can help reduce reflux symptoms Continue pantoprazole

## 2021-10-22 LAB — LIPID PANEL
Cholesterol: 156 mg/dL (ref <200)
HDL: 64 mg/dL (ref >=40)
LDL Cholesterol (Calc): 66 mg/dL (calc)
Non-HDL Cholesterol (Calc): 92 mg/dL (calc) (ref <130)
Total CHOL/HDL Ratio: 2.4 (calc) (ref <5.0)
Triglycerides: 188 mg/dL — ABNORMAL HIGH (ref <150)

## 2021-10-22 LAB — COMPLETE METABOLIC PANEL WITH GFR
AG Ratio: 2 (calc) (ref 1.0–2.5)
ALT: 23 U/L (ref 9–46)
AST: 24 U/L (ref 10–35)
Albumin: 4.6 g/dL (ref 3.6–5.1)
Alkaline phosphatase (APISO): 62 U/L (ref 35–144)
BUN: 15 mg/dL (ref 7–25)
CO2: 25 mmol/L (ref 20–32)
Calcium: 9.6 mg/dL (ref 8.6–10.3)
Chloride: 104 mmol/L (ref 98–110)
Creat: 1.08 mg/dL (ref 0.70–1.30)
Globulin: 2.3 g/dL (calc) (ref 1.9–3.7)
Glucose, Bld: 101 mg/dL — ABNORMAL HIGH (ref 65–99)
Potassium: 4 mmol/L (ref 3.5–5.3)
Sodium: 141 mmol/L (ref 135–146)
Total Bilirubin: 0.5 mg/dL (ref 0.2–1.2)
Total Protein: 6.9 g/dL (ref 6.1–8.1)
eGFR: 83 mL/min/{1.73_m2} (ref >=60)

## 2021-10-22 LAB — CBC
HCT: 41.5 % (ref 38.5–50.0)
Hemoglobin: 14.1 g/dL (ref 13.2–17.1)
MCH: 31.3 pg (ref 27.0–33.0)
MCHC: 34 g/dL (ref 32.0–36.0)
MCV: 92.2 fL (ref 80.0–100.0)
MPV: 10.6 fL (ref 7.5–12.5)
Platelets: 326 10*3/uL (ref 140–400)
RBC: 4.5 10*6/uL (ref 4.20–5.80)
RDW: 12.6 % (ref 11.0–15.0)
WBC: 9.3 10*3/uL (ref 3.8–10.8)

## 2021-10-22 LAB — MICROALBUMIN / CREATININE URINE RATIO
Creatinine, Urine: 182 mg/dL (ref 20–320)
Microalb Creat Ratio: 3 mcg/mg creat (ref <30)
Microalb, Ur: 0.5 mg/dL

## 2021-10-23 ENCOUNTER — Encounter: Payer: Self-pay | Admitting: Internal Medicine

## 2021-10-23 ENCOUNTER — Other Ambulatory Visit (HOSPITAL_COMMUNITY): Payer: Self-pay

## 2021-10-23 MED ORDER — ROSUVASTATIN CALCIUM 5 MG PO TABS
5.0000 mg | ORAL_TABLET | Freq: Every day | ORAL | 1 refills | Status: DC
  Filled 2021-10-23: qty 90, 90d supply, fill #0
  Filled 2022-03-24: qty 90, 90d supply, fill #1

## 2021-10-28 ENCOUNTER — Other Ambulatory Visit: Payer: Self-pay | Admitting: Internal Medicine

## 2021-10-28 ENCOUNTER — Other Ambulatory Visit (HOSPITAL_COMMUNITY): Payer: Self-pay

## 2021-10-28 MED ORDER — ZOLPIDEM TARTRATE ER 12.5 MG PO TBCR
12.5000 mg | EXTENDED_RELEASE_TABLET | Freq: Every day | ORAL | 0 refills | Status: DC
  Filled 2021-10-28: qty 30, 30d supply, fill #0

## 2021-10-28 MED ORDER — CYCLOBENZAPRINE HCL 10 MG PO TABS
10.0000 mg | ORAL_TABLET | Freq: Two times a day (BID) | ORAL | 0 refills | Status: DC | PRN
  Filled 2021-10-28: qty 60, 30d supply, fill #0

## 2021-10-28 NOTE — Telephone Encounter (Signed)
Requested medication (s) are due for refill today: Both due 10/31/21  Requested medication (s) are on the active medication list: yes    Last refill:  Both meds 30 day supply  09/30/21  Future visit scheduled no  Notes to clinic:Not delegated, please review. Thank you  Requested Prescriptions  Pending Prescriptions Disp Refills   zolpidem (AMBIEN CR) 12.5 MG CR tablet 30 tablet 0    Sig: Take 1 tablet (12.5 mg total) by mouth at bedtime.     Not Delegated - Psychiatry:  Anxiolytics/Hypnotics Failed - 10/28/2021 11:48 AM      Failed - This refill cannot be delegated      Failed - Urine Drug Screen completed in last 360 days      Passed - Valid encounter within last 6 months    Recent Outpatient Visits           1 week ago Type 2 diabetes mellitus without complication, without long-term current use of insulin ([place])   [facility], Coralie Keens, NP   6 months ago Screening for colon cancer   [place], Coralie Keens, NP   1 year ago Type 2 diabetes mellitus without complication, without long-term current use of insulin ([place])   [place], NP   1 year ago No-show for appointment   [facility] [place], Coralie Keens, NP               cyclobenzaprine (FLEXERIL) 10 MG tablet 60 tablet 0    Sig: Take 1 tablet (10 mg total) by mouth 2 (two) times daily as needed.     Not Delegated - Analgesics:  Muscle Relaxants Failed - 10/28/2021 11:48 AM      Failed - This refill cannot be delegated      Passed - Valid encounter within last 6 months    Recent Outpatient Visits           1 week ago Type 2 diabetes mellitus without complication, without long-term current use of insulin [facility])   [facility], Coralie Keens, NP   6 months ago Screening for colon cancer   [facility] [place], Coralie Keens, NP   1 year ago Type 2 diabetes mellitus without complication, without long-term  current use of insulin [facility])   [facility] [place], Coralie Keens, NP   1 year ago No-show for appointment   [facility] [place], Coralie Keens, NP

## 2021-11-04 ENCOUNTER — Other Ambulatory Visit (HOSPITAL_COMMUNITY): Payer: Self-pay

## 2021-11-17 ENCOUNTER — Other Ambulatory Visit: Payer: Self-pay | Admitting: Internal Medicine

## 2021-11-17 ENCOUNTER — Other Ambulatory Visit (HOSPITAL_COMMUNITY): Payer: Self-pay

## 2021-11-17 DIAGNOSIS — K219 Gastro-esophageal reflux disease without esophagitis: Secondary | ICD-10-CM

## 2021-11-18 ENCOUNTER — Other Ambulatory Visit: Payer: Self-pay | Admitting: Internal Medicine

## 2021-11-18 ENCOUNTER — Other Ambulatory Visit (HOSPITAL_COMMUNITY): Payer: Self-pay

## 2021-11-18 MED ORDER — CLONAZEPAM 0.5 MG PO TABS
0.5000 mg | ORAL_TABLET | Freq: Every day | ORAL | 0 refills | Status: DC
  Filled 2021-11-18: qty 30, 30d supply, fill #0

## 2021-11-18 MED ORDER — PANTOPRAZOLE SODIUM 40 MG PO TBEC
40.0000 mg | DELAYED_RELEASE_TABLET | Freq: Two times a day (BID) | ORAL | 2 refills | Status: DC
  Filled 2021-11-18: qty 60, 30d supply, fill #0
  Filled 2021-12-29: qty 60, 30d supply, fill #1
  Filled 2022-01-27: qty 60, 30d supply, fill #2

## 2021-11-18 MED ORDER — ONDANSETRON HCL 4 MG PO TABS
4.0000 mg | ORAL_TABLET | Freq: Three times a day (TID) | ORAL | 1 refills | Status: DC | PRN
  Filled 2021-11-18: qty 20, 7d supply, fill #0
  Filled 2021-12-29: qty 20, 7d supply, fill #1

## 2021-11-18 NOTE — Telephone Encounter (Signed)
Requested medication (s) are due for refill today: yes  Requested medication (s) are on the active medication list: yes  Last refill:  09/12/21 #20/1  Future visit scheduled: no  Notes to clinic:  Unable to refill per protocol, cannot delegate.    Requested Prescriptions  Pending Prescriptions Disp Refills   ondansetron (ZOFRAN) 4 MG tablet 20 tablet 1    Sig: Take 1 tablet (4 mg total) by mouth every 8 (eight) hours as needed for nausea or vomiting.     Not Delegated - Gastroenterology: Antiemetics - ondansetron Failed - 11/18/2021  7:39 AM      Failed - This refill cannot be delegated      Passed - AST in normal range and within 360 days    AST  Date Value Ref Range Status  10/21/2021 24 10 - 35 U/L Final         Passed - ALT in normal range and within 360 days    ALT  Date Value Ref Range Status  10/21/2021 23 9 - 46 U/L Final         Passed - Valid encounter within last 6 months    Recent Outpatient Visits           4 weeks ago Type 2 diabetes mellitus without complication, without long-term current use of insulin ([place])   [facility], Coralie Keens, NP   7 months ago Screening for colon cancer   [place], Coralie Keens, NP   1 year ago Type 2 diabetes mellitus without complication, without long-term current use of insulin [facility])   [facility] [place], Coralie Keens, NP   1 year ago No-show for appointment   [facility] [place], Coralie Keens, NP

## 2021-11-18 NOTE — Telephone Encounter (Signed)
Requested medication (s) are due for refill today: yes  Requested medication (s) are on the active medication list: yes    Last refill: 10/16/21   #30  0 refills  Future visit scheduled no  Notes to clinic:Not delegated, please review.   Requested Prescriptions  Pending Prescriptions Disp Refills   clonazePAM (KLONOPIN) 0.5 MG tablet 30 tablet 0    Sig: Take 1 tablet (0.5 mg total) by mouth at bedtime.     Not Delegated - Psychiatry: Anxiolytics/Hypnotics 2 Failed - 11/17/2021  9:45 AM      Failed - This refill cannot be delegated      Failed - Urine Drug Screen completed in last 360 days      Passed - Patient is not pregnant      Passed - Valid encounter within last 6 months    Recent Outpatient Visits           4 weeks ago Type 2 diabetes mellitus without complication, without long-term current use of insulin ([place])   [facility], Coralie Keens, NP   7 months ago Screening for colon cancer   [place], Coralie Keens, NP   1 year ago Type 2 diabetes mellitus without complication, without long-term current use of insulin ([place])   [place], NP   1 year ago No-show for appointment   [facility] [place], Coralie Keens, NP              Signed Prescriptions Disp Refills   pantoprazole (PROTONIX) 40 MG tablet 60 tablet 2    Sig: Take 1 tablet (40 mg total) by mouth 2 (two) times daily.     Gastroenterology: Proton Pump Inhibitors Passed - 11/17/2021  9:45 AM      Passed - Valid encounter within last 12 months    Recent Outpatient Visits           4 weeks ago Type 2 diabetes mellitus without complication, without long-term current use of insulin [facility])   [facility] [place], Coralie Keens, NP   7 months ago Screening for colon cancer   [facility] [place], Coralie Keens, NP   1 year ago Type 2 diabetes mellitus without complication, without long-term current use of insulin  [facility])   [facility], Coralie Keens, NP   1 year ago No-show for appointment   [facility] [place], Coralie Keens, NP

## 2021-11-18 NOTE — Telephone Encounter (Signed)
Requested Prescriptions  Pending Prescriptions Disp Refills  . pantoprazole (PROTONIX) 40 MG tablet 60 tablet 2    Sig: Take 1 tablet (40 mg total) by mouth 2 (two) times daily.     Gastroenterology: Proton Pump Inhibitors Passed - 11/17/2021  9:45 AM      Passed - Valid encounter within last 12 months    Recent Outpatient Visits          4 weeks ago Type 2 diabetes mellitus without complication, without long-term current use of insulin ([place])   [facility], Coralie Keens, NP   7 months ago Screening for colon cancer   [place], Coralie Keens, NP   1 year ago Type 2 diabetes mellitus without complication, without long-term current use of insulin [facility])   [facility], Coralie Keens, NP   1 year ago No-show for appointment   [facility] [place], Coralie Keens, NP             . clonazePAM (KLONOPIN) 0.5 MG tablet 30 tablet 0    Sig: Take 1 tablet (0.5 mg total) by mouth at bedtime.     Not Delegated - Psychiatry: Anxiolytics/Hypnotics 2 Failed - 11/17/2021  9:45 AM      Failed - This refill cannot be delegated      Failed - Urine Drug Screen completed in last 360 days      Passed - Patient is not pregnant      Passed - Valid encounter within last 6 months    Recent Outpatient Visits          4 weeks ago Type 2 diabetes mellitus without complication, without long-term current use of insulin ([place])   [facility], Coralie Keens, NP   7 months ago Screening for colon cancer   [facility] [place], Coralie Keens, NP   1 year ago Type 2 diabetes mellitus without complication, without long-term current use of insulin [facility])   [facility] [place], Coralie Keens, NP   1 year ago No-show for appointment   [facility] [place], Coralie Keens, NP

## 2021-11-26 ENCOUNTER — Other Ambulatory Visit (HOSPITAL_COMMUNITY): Payer: Self-pay

## 2021-11-26 ENCOUNTER — Other Ambulatory Visit: Payer: Self-pay | Admitting: Internal Medicine

## 2021-11-27 ENCOUNTER — Other Ambulatory Visit (HOSPITAL_COMMUNITY): Payer: Self-pay

## 2021-11-27 MED ORDER — ZOLPIDEM TARTRATE ER 12.5 MG PO TBCR
12.5000 mg | EXTENDED_RELEASE_TABLET | Freq: Every day | ORAL | 0 refills | Status: DC
  Filled 2021-11-27: qty 16, 16d supply, fill #0
  Filled 2021-12-15: qty 14, 14d supply, fill #1

## 2021-11-27 MED ORDER — GABAPENTIN 300 MG PO CAPS
300.0000 mg | ORAL_CAPSULE | Freq: Two times a day (BID) | ORAL | 0 refills | Status: DC
  Filled 2021-11-27: qty 180, 90d supply, fill #0

## 2021-11-27 MED ORDER — CYCLOBENZAPRINE HCL 10 MG PO TABS
10.0000 mg | ORAL_TABLET | Freq: Two times a day (BID) | ORAL | 0 refills | Status: DC | PRN
  Filled 2021-11-27: qty 60, 30d supply, fill #0

## 2021-11-27 NOTE — Telephone Encounter (Signed)
Requested Prescriptions  Pending Prescriptions Disp Refills  . gabapentin (NEURONTIN) 300 MG capsule 180 capsule 0    Sig: Take 1 capsule (300 mg total) by mouth 2 (two) times daily.     Neurology: Anticonvulsants - gabapentin Passed - 11/26/2021 10:27 AM      Passed - Cr in normal range and within 360 days    Creat  Date Value Ref Range Status  10/21/2021 1.08 0.70 - 1.30 mg/dL Final   Creatinine, Urine  Date Value Ref Range Status  10/21/2021 182 20 - 320 mg/dL Final         Passed - Completed PHQ-2 or PHQ-9 in the last 360 days      Passed - Valid encounter within last 12 months    Recent Outpatient Visits          1 month ago Type 2 diabetes mellitus without complication, without long-term current use of insulin ([place])   [facility], Coralie Keens, NP   7 months ago Screening for colon cancer   [place], Coralie Keens, NP   1 year ago Type 2 diabetes mellitus without complication, without long-term current use of insulin ([place])   [place], NP   1 year ago No-show for appointment   [facility] [place], Coralie Keens, NP             . zolpidem (AMBIEN CR) 12.5 MG CR tablet 30 tablet 0    Sig: Take 1 tablet (12.5 mg total) by mouth at bedtime.     Not Delegated - Psychiatry:  Anxiolytics/Hypnotics Failed - 11/26/2021 10:27 AM      Failed - This refill cannot be delegated      Failed - Urine Drug Screen completed in last 360 days      Passed - Valid encounter within last 6 months    Recent Outpatient Visits          1 month ago Type 2 diabetes mellitus without complication, without long-term current use of insulin ([place])   [facility], Coralie Keens, NP   7 months ago Screening for colon cancer   [place], Coralie Keens, NP   1 year ago Type 2 diabetes mellitus without complication, without long-term current use of insulin ([place])   [place], NP   1 year ago No-show for appointment   [facility] [place], Coralie Keens, NP             . cyclobenzaprine (FLEXERIL) 10 MG tablet 60 tablet 0    Sig: Take 1 tablet (10 mg total) by mouth 2 (two) times daily as needed.     Not Delegated - Analgesics:  Muscle Relaxants Failed - 11/26/2021 10:27 AM      Failed - This refill cannot be delegated      Passed - Valid encounter within last 6 months    Recent Outpatient Visits          1 month ago Type 2 diabetes mellitus without complication, without long-term current use of insulin [facility])   [facility], Coralie Keens, NP   7 months ago Screening for colon cancer   [place], Coralie Keens, NP   1 year ago Type 2 diabetes mellitus without complication, without long-term current use of insulin ([place])   [facility] [place], [place]  W, NP   1 year ago No-show for appointment   [facility] [place], Coralie Keens, NP

## 2021-11-27 NOTE — Telephone Encounter (Signed)
Requested medication (s) are due for refill today: yes  Requested medication (s) are on the active medication list:yes  Last refill:  10/28/21  Future visit scheduled:yes  Notes to clinic:  Unable to refill per protocol, cannot delegate.      Requested Prescriptions  Pending Prescriptions Disp Refills   zolpidem (AMBIEN CR) 12.5 MG CR tablet 30 tablet 0    Sig: Take 1 tablet (12.5 mg total) by mouth at bedtime.     Not Delegated - Psychiatry:  Anxiolytics/Hypnotics Failed - 11/26/2021 10:27 AM      Failed - This refill cannot be delegated      Failed - Urine Drug Screen completed in last 360 days      Passed - Valid encounter within last 6 months    Recent Outpatient Visits           1 month ago Type 2 diabetes mellitus without complication, without long-term current use of insulin ([place])   [facility], Coralie Keens, NP   7 months ago Screening for colon cancer   [place], Coralie Keens, NP   1 year ago Type 2 diabetes mellitus without complication, without long-term current use of insulin ([place])   [place], NP   1 year ago No-show for appointment   [facility] [place], Coralie Keens, NP               cyclobenzaprine (FLEXERIL) 10 MG tablet 60 tablet 0    Sig: Take 1 tablet (10 mg total) by mouth 2 (two) times daily as needed.     Not Delegated - Analgesics:  Muscle Relaxants Failed - 11/26/2021 10:27 AM      Failed - This refill cannot be delegated      Passed - Valid encounter within last 6 months    Recent Outpatient Visits           1 month ago Type 2 diabetes mellitus without complication, without long-term current use of insulin ([place])   [facility], Coralie Keens, NP   7 months ago Screening for colon cancer   [place], Coralie Keens, NP   1 year ago Type 2 diabetes mellitus without complication, without long-term current use of insulin  [facility])   [place], NP   1 year ago No-show for appointment   [facility] [place], Coralie Keens, NP              Signed Prescriptions Disp Refills   gabapentin (NEURONTIN) 300 MG capsule 180 capsule 0    Sig: Take 1 capsule (300 mg total) by mouth 2 (two) times daily.     Neurology: Anticonvulsants - gabapentin Passed - 11/26/2021 10:27 AM      Passed - Cr in normal range and within 360 days    Creat  Date Value Ref Range Status  10/21/2021 1.08 0.70 - 1.30 mg/dL Final   Creatinine, Urine  Date Value Ref Range Status  10/21/2021 182 20 - 320 mg/dL Final         Passed - Completed PHQ-2 or PHQ-9 in the last 360 days      Passed - Valid encounter within last 12 months    Recent Outpatient Visits           1 month ago Type 2 diabetes mellitus without complication, without long-term current use of insulin ([place])  [facility] [place], Coralie Keens, NP   7 months ago Screening for colon cancer   [facility] [place], Coralie Keens, NP   1 year ago Type 2 diabetes mellitus without complication, without long-term current use of insulin [facility])   [facility] [place], Coralie Keens, NP   1 year ago No-show for appointment   [facility] [place], Coralie Keens, NP

## 2021-11-28 ENCOUNTER — Other Ambulatory Visit (HOSPITAL_COMMUNITY): Payer: Self-pay

## 2021-12-15 ENCOUNTER — Other Ambulatory Visit (HOSPITAL_COMMUNITY): Payer: Self-pay

## 2021-12-29 ENCOUNTER — Other Ambulatory Visit: Payer: Self-pay | Admitting: Internal Medicine

## 2021-12-29 ENCOUNTER — Other Ambulatory Visit (HOSPITAL_COMMUNITY): Payer: Self-pay

## 2021-12-30 ENCOUNTER — Other Ambulatory Visit (HOSPITAL_COMMUNITY): Payer: Self-pay

## 2021-12-30 MED ORDER — CYCLOBENZAPRINE HCL 10 MG PO TABS
10.0000 mg | ORAL_TABLET | Freq: Two times a day (BID) | ORAL | 0 refills | Status: DC | PRN
  Filled 2021-12-30: qty 60, 30d supply, fill #0

## 2021-12-30 MED ORDER — ZOLPIDEM TARTRATE ER 12.5 MG PO TBCR
12.5000 mg | EXTENDED_RELEASE_TABLET | Freq: Every day | ORAL | 0 refills | Status: DC
  Filled 2021-12-30: qty 30, 30d supply, fill #0

## 2021-12-30 MED ORDER — CLONAZEPAM 0.5 MG PO TABS
0.5000 mg | ORAL_TABLET | Freq: Every day | ORAL | 0 refills | Status: DC
  Filled 2021-12-30: qty 30, 30d supply, fill #0

## 2021-12-30 NOTE — Telephone Encounter (Signed)
Requested medications are due for refill today.  Provider to determine  Requested medications are on the active medications list.  yes  Last refill. 11/2021  Future visit scheduled.   no  Notes to clinic.  Refills not delegated.    Requested Prescriptions  Pending Prescriptions Disp Refills   clonazePAM (KLONOPIN) 0.5 MG tablet 30 tablet 0    Sig: Take 1 tablet (0.5 mg total) by mouth at bedtime.     Not Delegated - Psychiatry: Anxiolytics/Hypnotics 2 Failed - 12/29/2021  8:40 AM      Failed - This refill cannot be delegated      Failed - Urine Drug Screen completed in last 360 days      Passed - Patient is not pregnant      Passed - Valid encounter within last 6 months    Recent Outpatient Visits           2 months ago Type 2 diabetes mellitus without complication, without long-term current use of insulin ([place])   [facility], Coralie Keens, NP   8 months ago Screening for colon cancer   [place], Coralie Keens, NP   1 year ago Type 2 diabetes mellitus without complication, without long-term current use of insulin [facility])   [place], NP   1 year ago No-show for appointment   [facility] [place], Coralie Keens, NP               zolpidem (AMBIEN CR) 12.5 MG CR tablet 30 tablet 0    Sig: Take 1 tablet (12.5 mg total) by mouth at bedtime.     Not Delegated - Psychiatry:  Anxiolytics/Hypnotics Failed - 12/29/2021  8:40 AM      Failed - This refill cannot be delegated      Failed - Urine Drug Screen completed in last 360 days      Passed - Valid encounter within last 6 months    Recent Outpatient Visits           2 months ago Type 2 diabetes mellitus without complication, without long-term current use of insulin ([place])   [facility], Coralie Keens, NP   8 months ago Screening for colon cancer   [place], Coralie Keens, NP   1 year ago Type 2  diabetes mellitus without complication, without long-term current use of insulin ([place])   [employer], NP   1 year ago No-show for appointment   [facility] [place], Coralie Keens, NP               cyclobenzaprine (FLEXERIL) 10 MG tablet 60 tablet 0    Sig: Take 1 tablet (10 mg total) by mouth 2 (two) times daily as needed.     Not Delegated - Analgesics:  Muscle Relaxants Failed - 12/29/2021  8:40 AM      Failed - This refill cannot be delegated      Passed - Valid encounter within last 6 months    Recent Outpatient Visits           2 months ago Type 2 diabetes mellitus without complication, without long-term current use of insulin [facility])   [facility] [place], Coralie Keens, NP   8 months ago Screening for colon cancer   [facility] [place], Coralie Keens, NP   1 year ago Type  2 diabetes mellitus without complication, without long-term current use of insulin [facility])   [facility] [place], Coralie Keens, NP   1 year ago No-show for appointment   [facility] [place], Coralie Keens, NP

## 2022-01-05 ENCOUNTER — Telehealth: Payer: No Typology Code available for payment source | Admitting: Physician Assistant

## 2022-01-05 ENCOUNTER — Other Ambulatory Visit (HOSPITAL_COMMUNITY): Payer: Self-pay

## 2022-01-05 DIAGNOSIS — J069 Acute upper respiratory infection, unspecified: Secondary | ICD-10-CM

## 2022-01-05 MED ORDER — IPRATROPIUM BROMIDE 0.03 % NA SOLN
2.0000 | Freq: Two times a day (BID) | NASAL | 0 refills | Status: DC
  Filled 2022-01-05: qty 30, 44d supply, fill #0

## 2022-01-05 MED ORDER — BENZONATATE 100 MG PO CAPS
100.0000 mg | ORAL_CAPSULE | Freq: Three times a day (TID) | ORAL | 0 refills | Status: DC | PRN
  Filled 2022-01-05: qty 30, 10d supply, fill #0

## 2022-01-05 NOTE — Progress Notes (Signed)
E-Visit for Upper Respiratory Infection   We are sorry you are not feeling well.  Here is how we plan to help!  Based on what you have shared with me, it looks like you may have a viral upper respiratory infection.  Upper respiratory infections are caused by a large number of viruses; however, rhinovirus is the most common cause.   Symptoms vary from person to person, with common symptoms including sore throat, cough, fatigue or lack of energy and feeling of general discomfort.  A low-grade fever of up to 100.4 may present, but is often uncommon.  Symptoms vary however, and are closely related to a person's age or underlying illnesses.  The most common symptoms associated with an upper respiratory infection are nasal discharge or congestion, cough, sneezing, headache and pressure in the ears and face.  These symptoms usually persist for about 3 to 10 days, but can last up to 2 weeks.  It is important to know that upper respiratory infections do not cause serious illness or complications in most cases.    Upper respiratory infections can be transmitted from person to person, with the most common method of transmission being a person's hands.  The virus is able to live on the skin and can infect other persons for up to 2 hours after direct contact.  Also, these can be transmitted when someone coughs or sneezes; thus, it is important to cover the mouth to reduce this risk.  To keep the spread of the illness at bay, good hand hygiene is very important.  This is an infection that is most likely caused by a virus. There are no specific treatments other than to help you with the symptoms until the infection runs its course.  We are sorry you are not feeling well.  Here is how we plan to help!   For nasal congestion, you may use an oral decongestants such as Mucinex D or if you have glaucoma or high blood pressure use plain Mucinex.  Saline nasal spray or nasal drops can help and can safely be used as often as  needed for congestion.  For your congestion, I have prescribed Ipratropium Bromide nasal spray 0.03% two sprays in each nostril 2-3 times a day  If you do not have a history of heart disease, hypertension, diabetes or thyroid disease, prostate/bladder issues or glaucoma, you may also use Sudafed to treat nasal congestion.  It is highly recommended that you consult with a pharmacist or your primary care physician to ensure this medication is safe for you to take.     If you have a cough, you may use cough suppressants such as Delsym and Robitussin.  If you have glaucoma or high blood pressure, you can also use Coricidin HBP.   For cough I have prescribed for you A prescription cough medication called Tessalon Perles 100 mg. You may take 1-2 capsules every 8 hours as needed for cough  If you have a sore or scratchy throat, use a saltwater gargle-  to  teaspoon of salt dissolved in a 4-ounce to 8-ounce glass of warm water.  Gargle the solution for approximately 15-30 seconds and then spit.  It is important not to swallow the solution.  You can also use throat lozenges/cough drops and Chloraseptic spray to help with throat pain or discomfort.  Warm or cold liquids can also be helpful in relieving throat pain.  For headache, pain or general discomfort, you can use Ibuprofen or Tylenol as directed.     Some authorities believe that zinc sprays or the use of Echinacea may shorten the course of your symptoms.   HOME CARE Only take medications as instructed by your medical team. Be sure to drink plenty of fluids. Water is fine as well as fruit juices, sodas and electrolyte beverages. You may want to stay away from caffeine or alcohol. If you are nauseated, try taking small sips of liquids. How do you know if you are getting enough fluid? Your urine should be a pale yellow or almost colorless. Get rest. Taking a steamy shower or using a humidifier may help nasal congestion and ease sore throat pain. You can  place a towel over your head and breathe in the steam from hot water coming from a faucet. Using a saline nasal spray works much the same way. Cough drops, hard candies and sore throat lozenges may ease your cough. Avoid close contacts especially the very young and the elderly Cover your mouth if you cough or sneeze Always remember to wash your hands.   GET HELP RIGHT AWAY IF: You develop worsening fever. If your symptoms do not improve within 10 days You develop yellow or green discharge from your nose over 3 days. You have coughing fits You develop a severe head ache or visual changes. You develop shortness of breath, difficulty breathing or start having chest pain Your symptoms persist after you have completed your treatment plan  MAKE SURE YOU  Understand these instructions. Will watch your condition. Will get help right away if you are not doing well or get worse.  Thank you for choosing an e-visit.  Your e-visit answers were reviewed by a board certified advanced clinical practitioner to complete your personal care plan. Depending upon the condition, your plan could have included both over the counter or prescription medications.  Please review your pharmacy choice. Make sure the pharmacy is open so you can pick up prescription now. If there is a problem, you may contact your provider through MyChart messaging and have the prescription routed to another pharmacy.  Your safety is important to us. If you have drug allergies check your prescription carefully.   For the next 24 hours you can use MyChart to ask questions about today's visit, request a non-urgent call back, or ask for a work or school excuse. You will get an email in the next two days asking about your experience. I hope that your e-visit has been valuable and will speed your recovery.  I have spent 5 minutes in review of e-visit questionnaire, review and updating patient chart, medical decision making and response to  patient.   Teofilo Lupinacci M Jodilyn Giese, PA-C  

## 2022-01-27 ENCOUNTER — Other Ambulatory Visit (HOSPITAL_COMMUNITY): Payer: Self-pay

## 2022-01-27 ENCOUNTER — Other Ambulatory Visit: Payer: Self-pay

## 2022-01-27 ENCOUNTER — Other Ambulatory Visit: Payer: Self-pay | Admitting: Internal Medicine

## 2022-01-29 ENCOUNTER — Other Ambulatory Visit (HOSPITAL_COMMUNITY): Payer: Self-pay

## 2022-01-29 MED ORDER — CYCLOBENZAPRINE HCL 10 MG PO TABS
10.0000 mg | ORAL_TABLET | Freq: Two times a day (BID) | ORAL | 0 refills | Status: DC | PRN
  Filled 2022-01-29: qty 60, 30d supply, fill #0

## 2022-01-29 MED ORDER — ZOLPIDEM TARTRATE ER 12.5 MG PO TBCR
12.5000 mg | EXTENDED_RELEASE_TABLET | Freq: Every day | ORAL | 0 refills | Status: DC
  Filled 2022-01-29: qty 30, 30d supply, fill #0

## 2022-01-29 MED ORDER — CLONAZEPAM 0.5 MG PO TABS
0.5000 mg | ORAL_TABLET | Freq: Every day | ORAL | 0 refills | Status: DC
  Filled 2022-01-29: qty 30, 30d supply, fill #0

## 2022-01-29 NOTE — Telephone Encounter (Signed)
Requested medication (s) are due for refill today: Yes  Requested medication (s) are on the active medication list: Yes  Last refill:  12/30/21  Future visit scheduled: No  Notes to clinic:  See request.    Requested Prescriptions  Pending Prescriptions Disp Refills   clonazePAM (KLONOPIN) 0.5 MG tablet 30 tablet 0    Sig: Take 1 tablet (0.5 mg total) by mouth at bedtime.     Not Delegated - Psychiatry: Anxiolytics/Hypnotics 2 Failed - 01/27/2022 12:09 PM      Failed - This refill cannot be delegated      Failed - Urine Drug Screen completed in last 360 days      Passed - Patient is not pregnant      Passed - Valid encounter within last 6 months    Recent Outpatient Visits           3 months ago Type 2 diabetes mellitus without complication, without long-term current use of insulin ([place])   [facility], Coralie Keens, NP   9 months ago Screening for colon cancer   [place], Coralie Keens, NP   1 year ago Type 2 diabetes mellitus without complication, without long-term current use of insulin [facility])   [place], NP   1 year ago No-show for appointment   [facility] [place], Coralie Keens, NP               zolpidem (AMBIEN CR) 12.5 MG CR tablet 30 tablet 0    Sig: Take 1 tablet (12.5 mg total) by mouth at bedtime.     Not Delegated - Psychiatry:  Anxiolytics/Hypnotics Failed - 01/27/2022 12:09 PM      Failed - This refill cannot be delegated      Failed - Urine Drug Screen completed in last 360 days      Passed - Valid encounter within last 6 months    Recent Outpatient Visits           3 months ago Type 2 diabetes mellitus without complication, without long-term current use of insulin ([place])   [facility], Coralie Ke[facility], NP   9 months ago Screening for colon cancer   [place], Coralie Keens, NP   1 year ago Type 2 diabetes mellitus without  complication, without long-term current use of insulin ([place])   [place], NP   1 year ago No-show for appointment   [facility] [place], Coralie Keens, NP               cyclobenzaprine (FLEXERIL) 10 MG tablet 60 tablet 0    Sig: Take 1 tablet (10 mg total) by mouth 2 (two) times daily as needed.     Not Delegated - Analgesics:  Muscle Relaxants Failed - 01/27/2022 12:09 PM      Failed - This refill cannot be delegated      Passed - Valid encounter within last 6 months    Recent Outpatient Visits           3 months ago Type 2 diabetes mellitus without complication, without long-term current use of insulin [facility])   [facility], [facility]alie [facility], NP   9 months ago Screening for colon cancer   [facility] [place], Coralie Keens, NP   1 year ago Type 2 diabetes mellitus without complication, without long-term  current use of insulin [facility])   [facility] [place], Coralie Keens, NP   1 year ago No-show for appointment   [facility] [place], Coralie Keens, NP

## 2022-02-24 ENCOUNTER — Other Ambulatory Visit: Payer: Self-pay

## 2022-02-24 ENCOUNTER — Other Ambulatory Visit: Payer: Self-pay | Admitting: Internal Medicine

## 2022-02-24 ENCOUNTER — Other Ambulatory Visit (HOSPITAL_COMMUNITY): Payer: Self-pay

## 2022-02-24 DIAGNOSIS — K219 Gastro-esophageal reflux disease without esophagitis: Secondary | ICD-10-CM

## 2022-02-24 MED ORDER — PANTOPRAZOLE SODIUM 40 MG PO TBEC
40.0000 mg | DELAYED_RELEASE_TABLET | Freq: Two times a day (BID) | ORAL | 2 refills | Status: DC
  Filled 2022-02-24: qty 60, 30d supply, fill #0
  Filled 2022-03-27: qty 60, 30d supply, fill #1
  Filled 2022-04-29: qty 60, 30d supply, fill #2

## 2022-02-24 NOTE — Telephone Encounter (Signed)
Requested Prescriptions  Pending Prescriptions Disp Refills   pantoprazole (PROTONIX) 40 MG tablet 60 tablet 2    Sig: Take 1 tablet (40 mg total) by mouth 2 (two) times daily.     Gastroenterology: Proton Pump Inhibitors Passed - 02/24/2022 11:38 AM      Passed - Valid encounter within last 12 months    Recent Outpatient Visits           4 months ago Type 2 diabetes mellitus without complication, without long-term current use of insulin [facility])   [facility] [place], Coralie Keens, NP   10 months ago Screening for colon cancer   [facility] [place], [place], NP   1 year ago Type 2 diabetes mellitus without complication, without long-term current use of insulin [facility])   [facility] [place], Coralie Keens, NP   1 year ago No-show for appointment   [facility] [place], Coralie Keens, NP

## 2022-02-26 ENCOUNTER — Other Ambulatory Visit (HOSPITAL_COMMUNITY): Payer: Self-pay

## 2022-02-26 ENCOUNTER — Other Ambulatory Visit: Payer: Self-pay | Admitting: Internal Medicine

## 2022-02-26 MED ORDER — GABAPENTIN 300 MG PO CAPS
300.0000 mg | ORAL_CAPSULE | Freq: Two times a day (BID) | ORAL | 0 refills | Status: DC
  Filled 2022-02-26: qty 180, 90d supply, fill #0

## 2022-02-26 NOTE — Telephone Encounter (Signed)
Requested medication (s) are due for refill today: yes  Requested medication (s) are on the active medication list: yes  Last refill:  01/29/22   Future visit scheduled: no  Notes to clinic:  Unable to refill per protocol, cannot delegate.    Requested Prescriptions  Pending Prescriptions Disp Refills   clonazePAM (KLONOPIN) 0.5 MG tablet 30 tablet 0    Sig: Take 1 tablet (0.5 mg total) by mouth at bedtime.     Not Delegated - Psychiatry: Anxiolytics/Hypnotics 2 Failed - 02/26/2022  9:35 AM      Failed - This refill cannot be delegated      Failed - Urine Drug Screen completed in last 360 days      Passed - Patient is not pregnant      Passed - Valid encounter within last 6 months    Recent Outpatient Visits           4 months ago Type 2 diabetes mellitus without complication, without long-term current use of insulin [facility])   [facility] [place], Coralie Keens, NP   10 months ago Screening for colon cancer   [facility] [place], [place], NP   1 year ago Type 2 diabetes mellitus without complication, without long-term current use of insulin [facility])   [facility] [place], Coralie Keens, NP   1 year ago No-show for appointment   [facility] [place], Coralie Keens, NP               zolpidem (AMBIEN CR) 12.5 MG CR tablet 30 tablet 0    Sig: Take 1 tablet (12.5 mg total) by mouth at bedtime.     Not Delegated - Psychiatry:  Anxiolytics/Hypnotics Failed - 02/26/2022  9:35 AM      Failed - This refill cannot be delegated      Failed - Urine Drug Screen completed in last 360 days      Passed - Valid encounter within last 6 months    Recent Outpatient Visits           4 months ago Type 2 diabetes mellitus without complication, without long-term current use of insulin [facility])   [facility] [place], Coralie Keens, NP   10 months ago Screening for colon cancer   [facility] [place], [place], NP   1 year ago Type 2 diabetes mellitus without complication, without long-term current use of insulin [facility])   [facility] [place], Coralie Keens, NP   1 year ago No-show for appointment   [facility] [place], Coralie Keens, NP               cyclobenzaprine (FLEXERIL) 10 MG tablet 60 tablet 0    Sig: Take 1 tablet (10 mg total) by mouth 2 (two) times daily as needed.     Not Delegated - Analgesics:  Muscle Relaxants Failed - 02/26/2022  9:35 AM      Failed - This refill cannot be delegated      Passed - Valid encounter within last 6 months    Recent Outpatient Visits           4 months ago Type 2 diabetes mellitus without complication, without long-term current use of insulin [facility])   [facility] [place], Coralie Keens, NP   10 months ago  Screening for colon cancer   [facility] [place], [place], NP   1 year ago Type 2 diabetes mellitus without complication, without long-term current use of insulin [facility])   [facility] [place], Coralie Keens, NP   1 year ago No-show for appointment   [facility] [place], Coralie Keens, NP              Signed Prescriptions Disp Refills   gabapentin (NEURONTIN) 300 MG capsule 180 capsule 0    Sig: Take 1 capsule (300 mg total) by mouth 2 (two) times daily.     Neurology: Anticonvulsants - gabapentin Passed - 02/26/2022  9:35 AM      Passed - Cr in normal range and within 360 days    Creat  Date Value Ref Range Status  10/21/2021 1.08 0.70 - 1.30 mg/dL Final   Creatinine, Urine  Date Value Ref Range Status  10/21/2021 182 20 - 320 mg/dL Final         Passed - Completed PHQ-2 or PHQ-9 in the last 360 days      Passed - Valid encounter within last 12 months    Recent Outpatient Visits           4 months ago Type 2 diabetes mellitus without  complication, without long-term current use of insulin [facility])   [facility] [place], Coralie Keens, NP   10 months ago Screening for colon cancer   [facility] [place], [place], NP   1 year ago Type 2 diabetes mellitus without complication, without long-term current use of insulin [facility])   [facility] [place], Coralie Keens, NP   1 year ago No-show for appointment   [facility] [place], Coralie Keens, NP

## 2022-02-26 NOTE — Telephone Encounter (Signed)
Requested Prescriptions  Pending Prescriptions Disp Refills   gabapentin (NEURONTIN) 300 MG capsule 180 capsule 0    Sig: Take 1 capsule (300 mg total) by mouth 2 (two) times daily.     Neurology: Anticonvulsants - gabapentin Passed - 02/26/2022  9:35 AM      Passed - Cr in normal range and within 360 days    Creat  Date Value Ref Range Status  10/21/2021 1.08 0.70 - 1.30 mg/dL Final   Creatinine, Urine  Date Value Ref Range Status  10/21/2021 182 20 - 320 mg/dL Final         Passed - Completed PHQ-2 or PHQ-9 in the last 360 days      Passed - Valid encounter within last 12 months    Recent Outpatient Visits           4 months ago Type 2 diabetes mellitus without complication, without long-term current use of insulin [facility])   [facility] [place], Coralie Keens, NP   10 months ago Screening for colon cancer   [facility] [place], [place], NP   1 year ago Type 2 diabetes mellitus without complication, without long-term current use of insulin [facility])   [facility] [place], Coralie Keens, NP   1 year ago No-show for appointment   [facility] [place], Coralie Keens, NP               clonazePAM (KLONOPIN) 0.5 MG tablet 30 tablet 0    Sig: Take 1 tablet (0.5 mg total) by mouth at bedtime.     Not Delegated - Psychiatry: Anxiolytics/Hypnotics 2 Failed - 02/26/2022  9:35 AM      Failed - This refill cannot be delegated      Failed - Urine Drug Screen completed in last 360 days      Passed - Patient is not pregnant      Passed - Valid encounter within last 6 months    Recent Outpatient Visits           4 months ago Type 2 diabetes mellitus without complication, without long-term current use of insulin [facility])   [facility] [place], Coralie Keens, NP   10 months ago Screening for colon cancer   [facility] [place], [place], NP   1 year ago  Type 2 diabetes mellitus without complication, without long-term current use of insulin [facility])   [facility] [place], Coralie Keens, NP   1 year ago No-show for appointment   [facility] [place], Coralie Keens, NP               zolpidem (AMBIEN CR) 12.5 MG CR tablet 30 tablet 0    Sig: Take 1 tablet (12.5 mg total) by mouth at bedtime.     Not Delegated - Psychiatry:  Anxiolytics/Hypnotics Failed - 02/26/2022  9:35 AM      Failed - This refill cannot be delegated      Failed - Urine Drug Screen completed in last 360 days      Passed - Valid encounter within last 6 months    Recent Outpatient Visits           4 months ago Type 2 diabetes mellitus without complication, without long-term current use of insulin [facility])   [facility] [place], [place]  W, NP   10 months ago Screening for colon cancer   [facility] [place], [place], NP   1 year ago Type 2 diabetes mellitus without complication, without long-term current use of insulin [facility])   [facility] [place], Coralie Keens, NP   1 year ago No-show for appointment   [facility] [place], Coralie Keens, NP               cyclobenzaprine (FLEXERIL) 10 MG tablet 60 tablet 0    Sig: Take 1 tablet (10 mg total) by mouth 2 (two) times daily as needed.     Not Delegated - Analgesics:  Muscle Relaxants Failed - 02/26/2022  9:35 AM      Failed - This refill cannot be delegated      Passed - Valid encounter within last 6 months    Recent Outpatient Visits           4 months ago Type 2 diabetes mellitus without complication, without long-term current use of insulin [facility])   [facility] [place], Coralie Keens, NP   10 months ago Screening for colon cancer   [facility] [place], [place], NP   1 year ago Type 2 diabetes mellitus without complication, without  long-term current use of insulin [facility])   [facility] [place], Coralie Keens, NP   1 year ago No-show for appointment   [facility] [place], Coralie Keens, NP

## 2022-02-27 ENCOUNTER — Other Ambulatory Visit (HOSPITAL_COMMUNITY): Payer: Self-pay

## 2022-02-27 MED ORDER — CYCLOBENZAPRINE HCL 10 MG PO TABS
10.0000 mg | ORAL_TABLET | Freq: Two times a day (BID) | ORAL | 0 refills | Status: DC | PRN
  Filled 2022-02-27: qty 60, 30d supply, fill #0

## 2022-02-27 MED ORDER — ZOLPIDEM TARTRATE ER 12.5 MG PO TBCR
12.5000 mg | EXTENDED_RELEASE_TABLET | Freq: Every day | ORAL | 0 refills | Status: DC
  Filled 2022-02-27: qty 30, 30d supply, fill #0

## 2022-02-27 MED ORDER — CLONAZEPAM 0.5 MG PO TABS
0.5000 mg | ORAL_TABLET | Freq: Every day | ORAL | 0 refills | Status: DC
  Filled 2022-02-27: qty 30, 30d supply, fill #0

## 2022-03-11 NOTE — Progress Notes (Unsigned)
NEUROLOGY FOLLOW UP OFFICE NOTE  [name] [id number]  Assessment/Plan:     1.  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  Headache, stable 4.  Hypertension 5.  Hyperlipidemia 6.  Type 2 diabetes mellitus 7.  Bilateral carpal tunnel syndrome - worsening numbness despite surgery.   1.  Headache prophylaxis:  Trokendi XR '50mg'$  QHS 2.  Headache rescue:  Tylenol 3.  Limit use of pain relievers to no more than 2 days out of week to prevent risk of rebound or medication-overuse headache. 4.  Secondary stroke prevention as managed by PCP: - ASA '81mg'$  daily - statin.  LDL goal less than 70 - Normotensive blood pressure - follow up with PCP regarding elevated blood pressure - Glycemic control.  Hgb A1c goal less than 7 5.  Recheck NCV-EMG of upper extremities 6.  Follow up in one year.   Subjective:  [name] is a 52 year old man with unstable angina and history of stroke who follows up regarding worsening upper extremity numbness and tingling.     UPDATE: In 2020, he was found to have severe cervical spinal stenosis, presenting with numbness, tingling and aching/soreness of arms and legs as well as paresthesias and tightness sensation involves the left side of arm and torso and a little bit in the leg.  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 He was referred to neurosurgery and underwent C3-C7 ACDF on 04/08/18. Residual pain has been maintained on gabapentin.  He continued to have numbness and tingling in the hands.  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  He  underwent bilateral carpal tunnel release ***   PAST MEDICAL HISTORY: Past Medical History:  Diagnosis Date   Anxiety    takes Xanax daily prn, panic attacks   Chronic gastric ulcer with bleeding s/p suture repair 2012 09/29/2010   Head 3 EGD which failed to stop the bleeding. Had exploratory laparotomy on 10/04/2010. Possible cause of the gastric ulcer was NSAID use.    Clotting disorder (HCC)    Constipation    related to pain meds   Depression    Diabetes mellitus without complication (HCC)    Dizziness    occasionally   Dyspnea    walking a bit   Erosive esophagitis    External hemorrhoid, bleeding    Gastric ulcer    history of   GERD (gastroesophageal reflux disease)    takes Protonix bid   Hiatal hernia    History of blood clots 2012   in abdomen   History of blood transfusion 2012   Hyperlipidemia    Hypertension    Incisional hernia s/p open repair w mesh Aug 2013 05/26/2011   Joint pain    knees   Nocturia    depends on amount of fluid he drinks   Pneumonia 2018   Pre-diabetes    Primary localized osteoarthritis of left hip 04/24/2014   Serrated polyp of colon    Sleep apnea    no cpap  mild no cpap needed   Stroke [facility])    Unstable angina ([place]) 04/09/2020   Upper GI bleed    Ventral hernia  MEDICATIONS: Current Outpatient Medications on File Prior to Visit  Medication Sig Dispense Refill   albuterol (VENTOLIN HFA) 108 (90 Base) MCG/ACT inhaler Inhale 1-2 puffs into the lungs every 4 (four) hours as needed for wheezing or shortness of breath 6.7 g 0   amLODipine (NORVASC) 10 MG tablet Take 1 tablet (10 mg total) by mouth daily. Please call our to schedule an overdue appointment with Dr. Johney Frame before anymore refills. [phone]. Thank you. 3rd attempt. 15 tablet 0   aspirin EC 81 MG EC tablet Take 1 tablet (81 mg total) by mouth daily.     benzonatate (TESSALON) 100 MG capsule Take 1 capsule (100 mg total) by mouth 3 (three) times daily as  needed. 30 capsule 0   carvedilol (COREG) 3.125 MG tablet Take 1 tablet (3.125 mg total) by mouth 2 (two) times daily with a meal. 180 tablet 1   clonazePAM (KLONOPIN) 0.5 MG tablet Take 1 tablet (0.5 mg total) by mouth at bedtime. 30 tablet 0   cyclobenzaprine (FLEXERIL) 10 MG tablet Take 1 tablet (10 mg total) by mouth 2 (two) times daily as needed. 60 tablet 0   fluticasone (FLONASE) 50 MCG/ACT nasal spray Place 2 sprays into both nostrils daily as needed for allergies. 16 g 11   gabapentin (NEURONTIN) 300 MG capsule Take 1 capsule (300 mg total) by mouth 2 (two) times daily. 180 capsule 0   ipratropium (ATROVENT) 0.03 % nasal spray Place 2 sprays into both nostrils every 12 (twelve) hours. 30 mL 0   lisinopril (ZESTRIL) 10 MG tablet Take 1 tablet (10 mg total) by mouth daily. Please call our to schedule an overdue appointment with Dr. Johney Frame before anymore refills. [phone]. Thank you. 3rd and final attempt. 15 tablet 0   ondansetron (ZOFRAN) 4 MG tablet Take 1 tablet (4 mg total) by mouth every 8 (eight) hours as needed for nausea or vomiting. 20 tablet 1   pantoprazole (PROTONIX) 40 MG tablet Take 1 tablet (40 mg total) by mouth 2 (two) times daily. 60 tablet 2   rosuvastatin (CRESTOR) 5 MG tablet Take 1 tablet (5 mg total) by mouth daily. 90 tablet 1   Semaglutide-Weight Management 2.4 MG/0.75ML SOAJ Inject 2.4 mg into the skin once a week. 3 mL 11   Topiramate ER (TROKENDI XR) 50 MG CP24 Take 1 capsule (50 mg) by mouth at bedtime. 30 capsule 5   zolpidem (AMBIEN CR) 12.5 MG CR tablet Take 1 tablet (12.5 mg total) by mouth at bedtime. 30 tablet 0   [DISCONTINUED] nortriptyline (PAMELOR) 25 MG capsule TAKE 1 CAPSULE BY MOUTH AT BEDTIME 30 capsule 0   [DISCONTINUED] simvastatin (ZOCOR) 10 MG tablet Take 1 tablet (10 mg total) by mouth daily. 90 tablet 2   No current facility-administered medications on file prior to visit.    ALLERGIES: Allergies  Allergen Reactions   Dilaudid  [Hydromorphone Hcl] Rash and Other (See Comments)    Shakey   Hydromorphone Hcl Other (See Comments) and Rash    Shakey   Metoclopramide Other (See Comments), Rash, Swelling and Hives    Swelling in lips   Metoclopramide Hcl Hives, Swelling, Rash and Other (See Comments)    Swelling in lips   Nsaids     H/o bleeding gastric ulcers   Benadryl [Diphenhydramine]     Causes patient to become hyper and pace    FAMILY HISTORY: Family History  Problem Relation Age of Onset   Diabetes Mother    Liver disease Mother  Diabetes Father    Heart disease Father    Healthy Sister    Healthy Brother    Healthy Brother    Colon cancer Neg Hx    Stomach cancer Neg Hx    Rectal cancer Neg Hx    Esophageal cancer Neg Hx    Colon polyps Neg Hx    Prostate cancer Neg Hx       Objective:  *** General: No acute distress.  Patient appears well-groomed.   Head:  Normocephalic/atraumatic Eyes:  Fundi examined but not visualized Neck: supple, no paraspinal tenderness, full range of motion Heart:  Regular rate and rhythm Neurological Exam: alert and oriented to person, place, and time.  Speech fluent and not dysarthric, language intact.  Difficulty tracking with my finger.  Otherwise, CN II-XII intact. Bulk and tone normal, muscle strength 5/5 throughout.  Sensation to light touch intact.  Deep tendon reflexes 3+ throughout.  Finger to nose testing intact.  Gait normal, Romberg negative.   Metta Clines, DO  CC: Webb Silversmith, NP

## 2022-03-12 ENCOUNTER — Ambulatory Visit: Payer: 59 | Admitting: Neurology

## 2022-03-12 ENCOUNTER — Encounter: Payer: Self-pay | Admitting: Neurology

## 2022-03-12 VITALS — BP 123/80 | HR 105 | Ht 69.0 in | Wt 199.8 lb

## 2022-03-12 DIAGNOSIS — G5603 Carpal tunnel syndrome, bilateral upper limbs: Secondary | ICD-10-CM

## 2022-03-12 DIAGNOSIS — M5412 Radiculopathy, cervical region: Secondary | ICD-10-CM

## 2022-03-12 NOTE — Patient Instructions (Signed)
As the numbness and pain has not improved after carpal tunnel surgery, will order MRI of cervical spine to evaluate for pinched nerve

## 2022-03-19 ENCOUNTER — Other Ambulatory Visit: Payer: Self-pay

## 2022-03-19 DIAGNOSIS — M5412 Radiculopathy, cervical region: Secondary | ICD-10-CM

## 2022-03-24 ENCOUNTER — Other Ambulatory Visit: Payer: Self-pay

## 2022-03-24 ENCOUNTER — Other Ambulatory Visit (HOSPITAL_COMMUNITY): Payer: Self-pay

## 2022-03-27 ENCOUNTER — Other Ambulatory Visit (HOSPITAL_COMMUNITY): Payer: Self-pay

## 2022-03-27 ENCOUNTER — Other Ambulatory Visit: Payer: Self-pay | Admitting: Internal Medicine

## 2022-03-27 ENCOUNTER — Other Ambulatory Visit: Payer: Self-pay

## 2022-03-27 MED ORDER — CYCLOBENZAPRINE HCL 10 MG PO TABS
10.0000 mg | ORAL_TABLET | Freq: Two times a day (BID) | ORAL | 0 refills | Status: DC | PRN
  Filled 2022-03-27 – 2022-03-30 (×2): qty 60, 30d supply, fill #0

## 2022-03-27 MED ORDER — ZOLPIDEM TARTRATE ER 12.5 MG PO TBCR
12.5000 mg | EXTENDED_RELEASE_TABLET | Freq: Every day | ORAL | 0 refills | Status: DC
  Filled 2022-03-27 – 2022-03-30 (×2): qty 30, 30d supply, fill #0

## 2022-03-27 MED ORDER — CLONAZEPAM 0.5 MG PO TABS
0.5000 mg | ORAL_TABLET | Freq: Every day | ORAL | 0 refills | Status: DC
  Filled 2022-03-27 – 2022-03-30 (×2): qty 30, 30d supply, fill #0

## 2022-03-27 NOTE — Telephone Encounter (Signed)
Requested medication (s) are due for refill today - yes  Requested medication (s) are on the active medication list -yes  Future visit scheduled -no  Last refill: all 02/27/22- clonazepam- #30, zolpidem #30, cyclobenzaprine #60  Notes to clinic: non delegated Rx  Requested Prescriptions  Pending Prescriptions Disp Refills   clonazePAM (KLONOPIN) 0.5 MG tablet 30 tablet 0    Sig: Take 1 tablet (0.5 mg total) by mouth at bedtime.     Not Delegated - Psychiatry: Anxiolytics/Hypnotics 2 Failed - 03/27/2022 11:41 AM      Failed - This refill cannot be delegated      Failed - Urine Drug Screen completed in last 360 days      Passed - Patient is not pregnant      Passed - Valid encounter within last 6 months    Recent Outpatient Visits           5 months ago Type 2 diabetes mellitus without complication, without long-term current use of insulin [facility])   [facility] [place], Coralie Keens, NP   11 months ago Screening for colon cancer   [facility] [place], [place] W, NP   1 year ago Type 2 diabetes mellitus without complication, without long-term current use of insulin [facility])   [facility] [place], Coralie Keens, NP   1 year ago No-show for appointment   [facility] [place], Coralie Keens, NP               zolpidem (AMBIEN CR) 12.5 MG CR tablet 30 tablet 0    Sig: Take 1 tablet (12.5 mg total) by mouth at bedtime.     Not Delegated - Psychiatry:  Anxiolytics/Hypnotics Failed - 03/27/2022 11:41 AM      Failed - This refill cannot be delegated      Failed - Urine Drug Screen completed in last 360 days      Passed - Valid encounter within last 6 months    Recent Outpatient Visits           5 months ago Type 2 diabetes mellitus without complication, without long-term current use of insulin [facility])   [facility] [place], Coralie Keens, NP   11 months ago Screening for  colon cancer   [facility] [place], [place], NP   1 year ago Type 2 diabetes mellitus without complication, without long-term current use of insulin [facility])   [facility] [place], Coralie Keens, NP   1 year ago No-show for appointment   [facility] [place], Coralie Keens, NP               cyclobenzaprine (FLEXERIL) 10 MG tablet 60 tablet 0    Sig: Take 1 tablet (10 mg total) by mouth 2 (two) times daily as needed.     Not Delegated - Analgesics:  Muscle Relaxants Failed - 03/27/2022 11:41 AM      Failed - This refill cannot be delegated      Passed - Valid encounter within last 6 months    Recent Outpatient Visits           5 months ago Type 2 diabetes mellitus without complication, without long-term current use of insulin [facility])   [facility] [place], [place], NP   11 months ago Screening for colon cancer  [facility] [place], [place], NP   1 year ago Type 2 diabetes mellitus without complication, without long-term current use of insulin [facility])   [facility] [place], Coralie Keens, NP   1 year ago No-show for appointment   [facility] [place], Coralie Keens, NP                 Requested Prescriptions  Pending Prescriptions Disp Refills   clonazePAM (KLONOPIN) 0.5 MG tablet 30 tablet 0    Sig: Take 1 tablet (0.5 mg total) by mouth at bedtime.     Not Delegated - Psychiatry: Anxiolytics/Hypnotics 2 Failed - 03/27/2022 11:41 AM      Failed - This refill cannot be delegated      Failed - Urine Drug Screen completed in last 360 days      Passed - Patient is not pregnant      Passed - Valid encounter within last 6 months    Recent Outpatient Visits           5 months ago Type 2 diabetes mellitus without complication, without long-term current use of insulin [facility])   [facility]  [place], Coralie Keens, NP   11 months ago Screening for colon cancer   [facility] [place], [place], NP   1 year ago Type 2 diabetes mellitus without complication, without long-term current use of insulin [facility])   [facility] [place], Coralie Keens, NP   1 year ago No-show for appointment   [facility] [place], Coralie Keens, NP               zolpidem (AMBIEN CR) 12.5 MG CR tablet 30 tablet 0    Sig: Take 1 tablet (12.5 mg total) by mouth at bedtime.     Not Delegated - Psychiatry:  Anxiolytics/Hypnotics Failed - 03/27/2022 11:41 AM      Failed - This refill cannot be delegated      Failed - Urine Drug Screen completed in last 360 days      Passed - Valid encounter within last 6 months    Recent Outpatient Visits           5 months ago Type 2 diabetes mellitus without complication, without long-term current use of insulin [facility])   [facility] [place], Coralie Keens, NP   11 months ago Screening for colon cancer   [facility] [place], [place], NP   1 year ago Type 2 diabetes mellitus without complication, without long-term current use of insulin [facility])   [facility] [place], Coralie Keens, NP   1 year ago No-show for appointment   [facility] [place], Coralie Keens, NP               cyclobenzaprine (FLEXERIL) 10 MG tablet 60 tablet 0    Sig: Take 1 tablet (10 mg total) by mouth 2 (two) times daily as needed.     Not Delegated - Analgesics:  Muscle Relaxants Failed - 03/27/2022 11:41 AM      Failed - This refill cannot be delegated      Passed - Valid encounter within last 6 months    Recent Outpatient Visits           5 months ago Type 2 diabetes  mellitus without complication, without long-term current use of insulin [facility])   [facility] [place], Coralie Keens, NP   11 months ago Screening for  colon cancer   [facility] [place], [place], NP   1 year ago Type 2 diabetes mellitus without complication, without long-term current use of insulin [facility])   [facility] [place], Coralie Keens, NP   1 year ago No-show for appointment   [facility] [place], Coralie Keens, NP

## 2022-03-30 ENCOUNTER — Other Ambulatory Visit (HOSPITAL_COMMUNITY): Payer: Self-pay

## 2022-03-30 ENCOUNTER — Other Ambulatory Visit: Payer: Self-pay

## 2022-04-15 ENCOUNTER — Other Ambulatory Visit: Payer: Self-pay | Admitting: Neurology

## 2022-04-15 ENCOUNTER — Other Ambulatory Visit: Payer: 59

## 2022-04-15 ENCOUNTER — Other Ambulatory Visit (HOSPITAL_COMMUNITY): Payer: Self-pay

## 2022-04-15 ENCOUNTER — Telehealth: Payer: Self-pay

## 2022-04-15 MED ORDER — DIAZEPAM 5 MG PO TABS
5.0000 mg | ORAL_TABLET | Freq: Once | ORAL | 0 refills | Status: AC
  Filled 2022-04-15: qty 1, 1d supply, fill #0

## 2022-04-15 NOTE — Telephone Encounter (Signed)
Per Patient, his MRI is scheduled for 04/16/22, Please send something help relax him.

## 2022-04-16 ENCOUNTER — Ambulatory Visit
Admission: RE | Admit: 2022-04-16 | Discharge: 2022-04-16 | Disposition: A | Discharge status: Home / Self Care | Payer: 59 | Source: Ambulatory Visit | Attending: Neurology | Admitting: Neurology

## 2022-04-16 DIAGNOSIS — M5412 Radiculopathy, cervical region: Secondary | ICD-10-CM

## 2022-04-16 DIAGNOSIS — M5020 Other cervical disc displacement, unspecified cervical region: Secondary | ICD-10-CM | POA: Diagnosis not present

## 2022-04-16 MED ORDER — GADOPICLENOL 0.5 MMOL/ML IV SOLN
9.0000 mL | Freq: Once | INTRAVENOUS | Status: AC | PRN
  Administered 2022-04-16: 9 mL via INTRAVENOUS

## 2022-04-17 NOTE — Telephone Encounter (Signed)
Per Dr.Jaffe, Diazepam sent to [facility].  Please remind patient that he cannot drive due to drowsiness - will need a driver to and from the MRI facility

## 2022-04-23 ENCOUNTER — Other Ambulatory Visit (HOSPITAL_COMMUNITY): Payer: Self-pay

## 2022-04-23 ENCOUNTER — Other Ambulatory Visit: Payer: Self-pay | Admitting: Neurology

## 2022-04-23 MED ORDER — GABAPENTIN 300 MG PO CAPS
300.0000 mg | ORAL_CAPSULE | Freq: Three times a day (TID) | ORAL | 5 refills | Status: DC
  Filled 2022-04-23 (×3): qty 90, 30d supply, fill #0
  Filled 2022-05-22: qty 90, 30d supply, fill #1
  Filled 2022-06-30: qty 90, 30d supply, fill #2
  Filled 2022-07-24: qty 90, 30d supply, fill #3
  Filled 2022-08-17: qty 90, 30d supply, fill #4
  Filled 2022-09-17: qty 90, 30d supply, fill #5

## 2022-04-23 NOTE — Progress Notes (Signed)
Patient advised.

## 2022-04-23 NOTE — Progress Notes (Signed)
LMOVM for patient to call the office.

## 2022-04-28 ENCOUNTER — Other Ambulatory Visit (HOSPITAL_COMMUNITY): Payer: Self-pay

## 2022-04-28 ENCOUNTER — Other Ambulatory Visit: Payer: Self-pay | Admitting: Internal Medicine

## 2022-04-29 ENCOUNTER — Other Ambulatory Visit: Payer: Self-pay | Admitting: Internal Medicine

## 2022-04-29 ENCOUNTER — Other Ambulatory Visit: Payer: Self-pay

## 2022-04-29 NOTE — Telephone Encounter (Signed)
Requested medication (s) are due for refill today:   Provider to review  Requested medication (s) are on the active medication list:   Yes for all 3  Future visit scheduled:   No   Last ordered: Klonopin 03/27/2022 #30, 0 refills;   Ambien 03/27/2022 #30, 0 refills;   Flexeril 03/27/2022 #60, 0 refills  Returned because all of these are non delegated refills    Requested Prescriptions  Pending Prescriptions Disp Refills   clonazePAM (KLONOPIN) 0.5 MG tablet 30 tablet 0    Sig: Take 1 tablet (0.5 mg total) by mouth at bedtime.     Not Delegated - Psychiatry: Anxiolytics/Hypnotics 2 Failed - 04/28/2022 11:37 AM      Failed - This refill cannot be delegated      Failed - Urine Drug Screen completed in last 360 days      Failed - Valid encounter within last 6 months    Recent Outpatient Visits           6 months ago Type 2 diabetes mellitus without complication, without long-term current use of insulin [facility])   [facility] [place], Coralie Keens, NP   1 year ago Screening for colon cancer   [facility] [place], [place], NP   1 year ago Type 2 diabetes mellitus without complication, without long-term current use of insulin [facility])   [facility] [place], Coralie Keens, NP   1 year ago No-show for appointment   [facility] [place], Coralie Keens, [place]              Passed - Patient is not pregnant       zolpidem (AMBIEN CR) 12.5 MG CR tablet 30 tablet 0    Sig: Take 1 tablet (12.5 mg total) by mouth at bedtime.     Not Delegated - Psychiatry:  Anxiolytics/Hypnotics Failed - 04/28/2022 11:37 AM      Failed - This refill cannot be delegated      Failed - Urine Drug Screen completed in last 360 days      Failed - Valid encounter within last 6 months    Recent Outpatient Visits           6 months ago Type 2 diabetes mellitus without complication, without long-term current use of insulin [facility])    [facility] [place], [facility]alie Keens, NP   1 year ago Screening for colon cancer   [place], [place] W, NP   1 year ago Type 2 diabetes mellitus without complication, without long-term current use of insulin [facility])   [facility] [place], Coralie Keens, NP   1 year ago No-show for appointment   [facility] [place], Coralie Keens, NP               cyclobenzaprine (FLEXERIL) 10 MG tablet 60 tablet 0    Sig: Take 1 tablet (10 mg total) by mouth 2 (two) times daily as needed.     Not Delegated - Analgesics:  Muscle Relaxants Failed - 04/28/2022 11:37 AM      Failed - This refill cannot be delegated      Failed - Valid encounter within last 6 months    Recent Outpatient Visits           6 months ago Type 2 diabetes mellitus without complication, without  long-term current use of insulin [facility])   [facility] [place], Coralie Keens, NP   1 year ago Screening for colon cancer   [facility] [place], [place], NP   1 year ago Type 2 diabetes mellitus without complication, without long-term current use of insulin [facility])   [facility] [place], Coralie Keens, NP   1 year ago No-show for appointment   [facility] [place], Coralie Keens, NP              s

## 2022-04-30 ENCOUNTER — Other Ambulatory Visit (HOSPITAL_COMMUNITY): Payer: Self-pay

## 2022-04-30 ENCOUNTER — Other Ambulatory Visit: Payer: Self-pay | Admitting: Internal Medicine

## 2022-04-30 NOTE — Telephone Encounter (Signed)
Unable to refill per protocol, appointment needed. Duplicate request.  Requested Prescriptions  Pending Prescriptions Disp Refills   clonazePAM (KLONOPIN) 0.5 MG tablet 30 tablet 0    Sig: Take 1 tablet (0.5 mg total) by mouth at bedtime.     Not Delegated - Psychiatry: Anxiolytics/Hypnotics 2 Failed - 04/29/2022 11:55 AM      Failed - This refill cannot be delegated      Failed - Urine Drug Screen completed in last 360 days      Failed - Valid encounter within last 6 months    Recent Outpatient Visits           6 months ago Type 2 diabetes mellitus without complication, without long-term current use of insulin [facility])   [facility] [place], Coralie Keens, NP   1 year ago Screening for colon cancer   [facility] [place], [place], NP   1 year ago Type 2 diabetes mellitus without complication, without long-term current use of insulin [facility])   [facility] [place], Coralie Keens, NP   1 year ago No-show for appointment   [facility] [place], Coralie Keens, [place]              Passed - Patient is not pregnant       zolpidem (AMBIEN CR) 12.5 MG CR tablet 30 tablet 0    Sig: Take 1 tablet (12.5 mg total) by mouth at bedtime.     Not Delegated - Psychiatry:  Anxiolytics/Hypnotics Failed - 04/29/2022 11:55 AM      Failed - This refill cannot be delegated      Failed - Urine Drug Screen completed in last 360 days      Failed - Valid encounter within last 6 months    Recent Outpatient Visits           6 months ago Type 2 diabetes mellitus without complication, without long-term current use of insulin [facility])   [facility] [place], Coralie Keens, NP   1 year ago Screening for colon cancer   [place], [place], NP   1 year ago Type 2 diabetes mellitus without complication, without long-term current use of insulin [facility])   [facility] [place], Coralie Keens, NP   1 year ago No-show for appointment   [facility] [place], Coralie Keens, NP               cyclobenzaprine (FLEXERIL) 10 MG tablet 60 tablet 0    Sig: Take 1 tablet (10 mg total) by mouth 2 (two) times daily as needed.     Not Delegated - Analgesics:  Muscle Relaxants Failed - 04/29/2022 11:55 AM      Failed - This refill cannot be delegated      Failed - Valid encounter within last 6 months    Recent Outpatient Visits           6 months ago Type 2 diabetes mellitus without complication, without long-term current use of insulin [facility])   [facility] [place], Coralie Keens, NP   1 year ago Screening for colon cancer   [facility] [place], [place], NP   1 year ago Type 2 diabetes mellitus without complication, without long-term current use of insulin [facility])   [place]  [facility] [place], Coralie Keens, NP   1 year ago No-show for appointment   [facility] [place], Coralie Keens, [place]

## 2022-04-30 NOTE — Telephone Encounter (Signed)
Requested medications are due for refill today.  yes  Requested medications are on the active medications list.  Yes - all 3   Last refill. 03/27/2022  Future visit scheduled.   no  Notes to clinic.  Refill not delegated. Called pt  - lmom to call back to schedule OV.    Requested Prescriptions  Pending Prescriptions Disp Refills   clonazePAM (KLONOPIN) 0.5 MG tablet 30 tablet 0    Sig: Take 1 tablet (0.5 mg total) by mouth at bedtime.     Not Delegated - Psychiatry: Anxiolytics/Hypnotics 2 Failed - 04/30/2022 11:50 AM      Failed - This refill cannot be delegated      Failed - Urine Drug Screen completed in last 360 days      Failed - Valid encounter within last 6 months    Recent Outpatient Visits           6 months ago Type 2 diabetes mellitus without complication, without long-term current use of insulin [facility])   [facility] [place], Coralie Keens, NP   1 year ago Screening for colon cancer   [facility] [place], [place], NP   1 year ago Type 2 diabetes mellitus without complication, without long-term current use of insulin [facility])   [facility] [place], Coralie Keens, NP   1 year ago No-show for appointment   [facility] [place], Coralie Keens, [place]              Passed - Patient is not pregnant       zolpidem (AMBIEN CR) 12.5 MG CR tablet 30 tablet 0    Sig: Take 1 tablet (12.5 mg total) by mouth at bedtime.     Not Delegated - Psychiatry:  Anxiolytics/Hypnotics Failed - 04/30/2022 11:50 AM      Failed - This refill cannot be delegated      Failed - Urine Drug Screen completed in last 360 days      Failed - Valid encounter within last 6 months    Recent Outpatient Visits           6 months ago Type 2 diabetes mellitus without complication, without long-term current use of insulin [facility])   [facility] [place], Coralie Keens, NP   1 year ago Screening for  colon cancer   [place], [place], NP   1 year ago Type 2 diabetes mellitus without complication, without long-term current use of insulin [facility])   [facility] [place], Coralie Keens, NP   1 year ago No-show for appointment   [facility] [place], Coralie Keens, NP               cyclobenzaprine (FLEXERIL) 10 MG tablet 60 tablet 0    Sig: Take 1 tablet (10 mg total) by mouth 2 (two) times daily as needed.     Not Delegated - Analgesics:  Muscle Relaxants Failed - 04/30/2022 11:50 AM      Failed - This refill cannot be delegated      Failed - Valid encounter within last 6 months    Recent Outpatient Visits           6 months ago Type 2 diabetes mellitus without complication, without long-term current use of insulin [facility])   [place],  Coralie Keens, NP   1 year ago Screening for colon cancer   [facility] [place], [place], NP   1 year ago Type 2 diabetes mellitus without complication, without long-term current use of insulin [facility])   [facility] [place], Coralie Keens, NP   1 year ago No-show for appointment   [facility] [place], Coralie Keens, NP

## 2022-04-30 NOTE — Telephone Encounter (Signed)
Called pt. Left message on machine to call back for office visit.

## 2022-05-01 ENCOUNTER — Other Ambulatory Visit (HOSPITAL_COMMUNITY): Payer: Self-pay

## 2022-05-01 ENCOUNTER — Ambulatory Visit (INDEPENDENT_AMBULATORY_CARE_PROVIDER_SITE_OTHER): Payer: 59 | Admitting: Internal Medicine

## 2022-05-01 VITALS — BP 142/94 | HR 113 | Temp 97.5°F | Ht 69.0 in | Wt 198.0 lb

## 2022-05-01 DIAGNOSIS — Z6829 Body mass index (BMI) 29.0-29.9, adult: Secondary | ICD-10-CM

## 2022-05-01 DIAGNOSIS — E119 Type 2 diabetes mellitus without complications: Secondary | ICD-10-CM

## 2022-05-01 DIAGNOSIS — Z0001 Encounter for general adult medical examination with abnormal findings: Secondary | ICD-10-CM | POA: Diagnosis not present

## 2022-05-01 DIAGNOSIS — E663 Overweight: Secondary | ICD-10-CM

## 2022-05-01 DIAGNOSIS — Z125 Encounter for screening for malignant neoplasm of prostate: Secondary | ICD-10-CM

## 2022-05-01 DIAGNOSIS — Z79899 Other long term (current) drug therapy: Secondary | ICD-10-CM

## 2022-05-01 DIAGNOSIS — I10 Essential (primary) hypertension: Secondary | ICD-10-CM | POA: Diagnosis not present

## 2022-05-01 MED ORDER — ZOLPIDEM TARTRATE ER 12.5 MG PO TBCR
12.5000 mg | EXTENDED_RELEASE_TABLET | Freq: Every day | ORAL | 0 refills | Status: DC
  Filled 2022-05-01: qty 30, 30d supply, fill #0

## 2022-05-01 MED ORDER — AMLODIPINE BESYLATE 10 MG PO TABS
10.0000 mg | ORAL_TABLET | Freq: Every day | ORAL | 0 refills | Status: DC
  Filled 2022-05-01: qty 90, 90d supply, fill #0

## 2022-05-01 MED ORDER — CYCLOBENZAPRINE HCL 10 MG PO TABS
10.0000 mg | ORAL_TABLET | Freq: Two times a day (BID) | ORAL | 0 refills | Status: DC | PRN
  Filled 2022-05-01: qty 60, 30d supply, fill #0

## 2022-05-01 MED ORDER — CLONAZEPAM 0.5 MG PO TABS
0.5000 mg | ORAL_TABLET | Freq: Every day | ORAL | 0 refills | Status: DC
  Filled 2022-05-01: qty 30, 30d supply, fill #0

## 2022-05-01 MED ORDER — LISINOPRIL 10 MG PO TABS
10.0000 mg | ORAL_TABLET | Freq: Every day | ORAL | 0 refills | Status: DC
  Filled 2022-05-01: qty 90, 90d supply, fill #0

## 2022-05-01 MED ORDER — CARVEDILOL 3.125 MG PO TABS
3.1250 mg | ORAL_TABLET | Freq: Two times a day (BID) | ORAL | 0 refills | Status: DC
  Filled 2022-05-01 – 2022-06-23 (×2): qty 180, 90d supply, fill #0

## 2022-05-01 NOTE — Assessment & Plan Note (Signed)
Encourage diet and exercise for weight loss 

## 2022-05-01 NOTE — Patient Instructions (Signed)
Health Maintenance, Male Adopting a healthy lifestyle and getting preventive care are important in promoting health and wellness. Ask your health care provider about: The right schedule for you to have regular tests and exams. Things you can do on your own to prevent diseases and keep yourself healthy. What should I know about diet, weight, and exercise? Eat a healthy diet  Eat a diet that includes plenty of vegetables, fruits, low-fat dairy products, and lean protein. Do not eat a lot of foods that are high in solid fats, added sugars, or sodium. Maintain a healthy weight Body mass index (BMI) is a measurement that can be used to identify possible weight problems. It estimates body fat based on height and weight. Your health care provider can help determine your BMI and help you achieve or maintain a healthy weight. Get regular exercise Get regular exercise. This is one of the most important things you can do for your health. Most adults should: Exercise for at least 150 minutes each week. The exercise should increase your heart rate and make you sweat (moderate-intensity exercise). Do strengthening exercises at least twice a week. This is in addition to the moderate-intensity exercise. Spend less time sitting. Even light physical activity can be beneficial. Watch cholesterol and blood lipids Have your blood tested for lipids and cholesterol at 52 years of age, then have this test every 5 years. You may need to have your cholesterol levels checked more often if: Your lipid or cholesterol levels are high. You are older than 52 years of age. You are at high risk for heart disease. What should I know about cancer screening? Many types of cancers can be detected early and may often be prevented. Depending on your health history and family history, you may need to have cancer screening at various ages. This may include screening for: Colorectal cancer. Prostate cancer. Skin cancer. Lung  cancer. What should I know about heart disease, diabetes, and high blood pressure? Blood pressure and heart disease High blood pressure causes heart disease and increases the risk of stroke. This is more likely to develop in people who have high blood pressure readings or are overweight. Talk with your health care provider about your target blood pressure readings. Have your blood pressure checked: Every 3-5 years if you are 18-39 years of age. Every year if you are 40 years old or older. If you are between the ages of 65 and 75 and are a current or former smoker, ask your health care provider if you should have a one-time screening for abdominal aortic aneurysm (AAA). Diabetes Have regular diabetes screenings. This checks your fasting blood sugar level. Have the screening done: Once every three years after age 45 if you are at a normal weight and have a low risk for diabetes. More often and at a younger age if you are overweight or have a high risk for diabetes. What should I know about preventing infection? Hepatitis B If you have a higher risk for hepatitis B, you should be screened for this virus. Talk with your health care provider to find out if you are at risk for hepatitis B infection. Hepatitis C Blood testing is recommended for: Everyone born from 1945 through 1965. Anyone with known risk factors for hepatitis C. Sexually transmitted infections (STIs) You should be screened each year for STIs, including gonorrhea and chlamydia, if: You are sexually active and are younger than 52 years of age. You are older than 52 years of age and your   health care provider tells you that you are at risk for this type of infection. Your sexual activity has changed since you were last screened, and you are at increased risk for chlamydia or gonorrhea. Ask your health care provider if you are at risk. Ask your health care provider about whether you are at high risk for HIV. Your health care provider  may recommend a prescription medicine to help prevent HIV infection. If you choose to take medicine to prevent HIV, you should first get tested for HIV. You should then be tested every 3 months for as long as you are taking the medicine. Follow these instructions at home: Alcohol use Do not drink alcohol if your health care provider tells you not to drink. If you drink alcohol: Limit how much you have to 0-2 drinks a day. Know how much alcohol is in your drink. In the [place] one drink equals one 12 oz bottle of beer (355 mL), one 5 oz glass of wine (148 mL), or one 1 oz glass of hard liquor (44 mL). Lifestyle Do not use any products that contain nicotine or tobacco. These products include cigarettes, chewing tobacco, and vaping devices, such as e-cigarettes. If you need help quitting, ask your health care provider. Do not use street drugs. Do not share needles. Ask your health care provider for help if you need support or information about quitting drugs. General instructions Schedule regular health, dental, and eye exams. Stay current with your vaccines. Tell your health care provider if: You often feel depressed. You have ever been abused or do not feel safe at home. Summary Adopting a healthy lifestyle and getting preventive care are important in promoting health and wellness. Follow your health care provider's instructions about healthy diet, exercising, and getting tested or screened for diseases. Follow your health care provider's instructions on monitoring your cholesterol and blood pressure. This information is not intended to replace advice given to you by your health care provider. Make sure you discuss any questions you have with your health care provider. Document Revised: 06/10/2020 Document Reviewed: 06/10/2020 Elsevier Patient Education  2023 [employer]  

## 2022-05-01 NOTE — Assessment & Plan Note (Signed)
Uncontrolled off meds, refilled today Discussed the importance of medication compliance  Follow-up in 2 weeks for BP check

## 2022-05-01 NOTE — Progress Notes (Signed)
Subjective:    Patient ID: [name], male    DOB: September 19, 1970, 52 y.o.   MRN: [id number]  HPI  Patient presents to clinic today for his annual exam.  Of note, his BP today is 154/96.  He admits that he is not taking his Amlodipine, Carvedilol or Lisinopril.  Flu: 11/2021 Tetanus: 10/2018 COVID: Pfizer x 3 Pneumovax: 04/2021 Shingrix: Never PSA screening: 04/2021 Colon screening: 07/2021 Vision screening: annually Dentist: as needed  Diet: He does eat meat. He consumes fruits and veggies. He tries to avoid fried foods. He drinks mostly water, sugar free Koolaid Exercise: Walking, situps  Review of Systems     Past Medical History:  Diagnosis Date   Anxiety    takes Xanax daily prn, panic attacks   Chronic gastric ulcer with bleeding s/p suture repair 2012 09/29/2010   Head 3 EGD which failed to stop the bleeding. Had exploratory laparotomy on 10/04/2010. Possible cause of the gastric ulcer was NSAID use.    Clotting disorder (HCC)    Constipation    related to pain meds   Depression    Diabetes mellitus without complication (HCC)    Dizziness    occasionally   Dyspnea    walking a bit   Erosive esophagitis    External hemorrhoid, bleeding    Gastric ulcer    history of   GERD (gastroesophageal reflux disease)    takes Protonix bid   Hiatal hernia    History of blood clots 2012   in abdomen   History of blood transfusion 2012   Hyperlipidemia    Hypertension    Incisional hernia s/p open repair w mesh Aug 2013 05/26/2011   Joint pain    knees   Nocturia    depends on amount of fluid he drinks   Pneumonia 2018   Pre-diabetes    Primary localized osteoarthritis of left hip 04/24/2014   Serrated polyp of colon    Sleep apnea    no cpap  mild no cpap needed   Stroke [facility])    Unstable angina ([place]) 04/09/2020   Upper GI bleed    Ventral hernia     Current Outpatient Medications  Medication Sig Dispense Refill   albuterol (VENTOLIN HFA) 108 (90 Base)  MCG/ACT inhaler Inhale 1-2 puffs into the lungs every 4 (four) hours as needed for wheezing or shortness of breath 6.7 g 0   amLODipine (NORVASC) 10 MG tablet Take 1 tablet (10 mg total) by mouth daily. Please call our to schedule an overdue appointment with Dr. Johney Frame before anymore refills. [phone]. Thank you. 3rd attempt. 15 tablet 0   aspirin EC 81 MG EC tablet Take 1 tablet (81 mg total) by mouth daily.     benzonatate (TESSALON) 100 MG capsule Take 1 capsule (100 mg total) by mouth 3 (three) times daily as needed. 30 capsule 0   carvedilol (COREG) 3.125 MG tablet Take 1 tablet (3.125 mg total) by mouth 2 (two) times daily with a meal. 180 tablet 1   clonazePAM (KLONOPIN) 0.5 MG tablet Take 1 tablet (0.5 mg total) by mouth at bedtime. 30 tablet 0   cyclobenzaprine (FLEXERIL) 10 MG tablet Take 1 tablet (10 mg total) by mouth 2 (two) times daily as needed. 60 tablet 0   fluticasone (FLONASE) 50 MCG/ACT nasal spray Place 2 sprays into both nostrils daily as needed for allergies. 16 g 11   gabapentin (NEURONTIN) 300 MG capsule Take 1 capsule (300 mg total) by  mouth 3 (three) times daily. 90 capsule 5   ipratropium (ATROVENT) 0.03 % nasal spray Place 2 sprays into both nostrils every 12 (twelve) hours. 30 mL 0   lisinopril (ZESTRIL) 10 MG tablet Take 1 tablet (10 mg total) by mouth daily. Please call our to schedule an overdue appointment with Dr. Johney Frame before anymore refills. [phone]. Thank you. 3rd and final attempt. 15 tablet 0   ondansetron (ZOFRAN) 4 MG tablet Take 1 tablet (4 mg total) by mouth every 8 (eight) hours as needed for nausea or vomiting. 20 tablet 1   pantoprazole (PROTONIX) 40 MG tablet Take 1 tablet (40 mg total) by mouth 2 (two) times daily. 60 tablet 2   rosuvastatin (CRESTOR) 5 MG tablet Take 1 tablet (5 mg total) by mouth daily. 90 tablet 1   Semaglutide-Weight Management 2.4 MG/0.75ML SOAJ Inject 2.4 mg into the skin once a week. 3 mL 11   Topiramate ER  (TROKENDI XR) 50 MG CP24 Take 1 capsule (50 mg) by mouth at bedtime. 30 capsule 5   zolpidem (AMBIEN CR) 12.5 MG CR tablet Take 1 tablet (12.5 mg total) by mouth at bedtime. 30 tablet 0   No current facility-administered medications for this visit.    Allergies  Allergen Reactions   Dilaudid [Hydromorphone Hcl] Rash and Other (See Comments)    Shakey   Hydromorphone Hcl Other (See Comments) and Rash    Shakey   Metoclopramide Other (See Comments), Rash, Swelling and Hives    Swelling in lips   Metoclopramide Hcl Hives, Swelling, Rash and Other (See Comments)    Swelling in lips   Nsaids     H/o bleeding gastric ulcers   Benadryl [Diphenhydramine]     Causes patient to become hyper and pace    Family History  Problem Relation Age of Onset   Diabetes Mother    Liver disease Mother    Diabetes Father    Heart disease Father    Healthy Sister    Healthy Brother    Healthy Brother    Colon cancer Neg Hx    Stomach cancer Neg Hx    Rectal cancer Neg Hx    Esophageal cancer Neg Hx    Colon polyps Neg Hx    Prostate cancer Neg Hx     Social History   Socioeconomic History   Marital status: Single    Spouse name: Not on file   Number of children: 1   Years of education: 12   Highest education level: High school graduate  Occupational History   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  Has one daughter.  Works in Theatre manager for [employer].  Education: high school.       Patient is left-handed.   Social Determinants of  Health   Financial Resource Strain: Not on file  Food Insecurity: Not on file  Transportation Needs: Not on file  Physical Activity: Not  on file  Stress: Not on file  Social Connections: Not on file  Intimate Partner Violence: Not on file     Constitutional: Patient reports headaches.  Denies fever, malaise, fatigue, or abrupt weight changes.  HEENT: Denies eye pain, eye redness, ear pain, ringing in the ears, wax buildup, runny nose, nasal congestion, bloody nose, or sore throat. Respiratory: Denies difficulty breathing, shortness of breath, cough or sputum production.   Cardiovascular: Denies chest pain, chest tightness, palpitations or swelling in the hands or feet.  Gastrointestinal: Denies abdominal pain, bloating, constipation, diarrhea or blood in the stool.  GU: Denies urgency, frequency, pain with urination, burning sensation, blood in urine, odor or discharge. Musculoskeletal: Denies decrease in range of motion, difficulty with gait, muscle pain or joint pain and swelling.  Skin: Denies redness, rashes, lesions or ulcercations.  Neurological: Patient reports insomnia, paresthesias of hands.  Denies dizziness, difficulty with memory, difficulty with speech or problems with balance and coordination.  Psych: Patient has a history of anxiety.  Denies depression, SI/HI.  No other specific complaints in a complete review of systems (except as listed in HPI above).  Objective:   Physical Exam  BP (!) 142/94 (BP Location: Left Arm, Patient Position: Sitting, Cuff Size: Normal)   Pulse (!) 113   Temp (!) 97.5 F (36.4 C) (Temporal)   Ht 5\' 9"  (1.753 m)   Wt 198 lb (89.8 kg)   SpO2 99%   BMI 29.24 kg/m   Wt Readings from Last 3 Encounters:  03/12/22 199 lb 12.8 oz (90.6 kg)  10/21/21 199 lb (90.3 kg)  07/15/21 195 lb (88.5 kg)    General: Appears his stated age, overweight, in NAD. Skin: Warm, dry and intact. No ulcerations noted. HEENT: Head: normal shape and size;  Eyes: sclera white, no icterus, conjunctiva pink, PERRLA and EOMs intact;  Neck:  Neck supple, trachea midline. No masses, lumps or thyromegaly present.  Cardiovascular: Tachycardic with normal rhythm. S1,S2 noted.  No murmur, rubs or gallops noted. No JVD or BLE edema. No carotid bruits noted. Pulmonary/Chest: Normal effort and positive vesicular breath sounds. No respiratory distress. No wheezes, rales or ronchi noted.  Abdomen: Soft and nontender. Normal bowel sounds.  Musculoskeletal: Strength 5/5 BUE/BLE.  No difficulty with gait.  Neurological: Alert and oriented. Cranial nerves II-XII grossly intact. Coordination normal.  Psychiatric: Mood and affect normal. Behavior is normal. Judgment and thought content normal.     BMET    Component Value Date/Time   NA 141 10/21/2021 1509   NA 142 04/26/2020 0849   K 4.0 10/21/2021 1509   CL 104 10/21/2021 1509   CO2 25 10/21/2021 1509   GLUCOSE 101 (H) 10/21/2021 1509   BUN 15 10/21/2021 1509   BUN 18 04/26/2020 0849   CREATININE 1.08 10/21/2021 1509   CALCIUM 9.6 10/21/2021 1509   GFRNONAA >60 04/11/2020 0320   GFRAA 96 12/12/2019 1134    Lipid Panel     Component Value Date/Time   CHOL 156 10/21/2021 1509   CHOL 112 11/28/2020 0746   TRIG 188 (H) 10/21/2021 1509   HDL 64 10/21/2021 1509   HDL 60 11/28/2020 0746   CHOLHDL 2.4 10/21/2021 1509   VLDL 32 04/10/2020 0402   LDLCALC 66 10/21/2021 1509    CBC    Component Value Date/Time   WBC 9.3 10/21/2021 1509   RBC 4.50 10/21/2021 1509   HGB 14.1 10/21/2021 1509   HCT 41.5 10/21/2021 1509   PLT 326 10/21/2021 1509  MCV 92.2 10/21/2021 1509   MCH 31.3 10/21/2021 1509   MCHC 34.0 10/21/2021 1509   RDW 12.6 10/21/2021 1509   LYMPHSABS 2.4 04/09/2020 1553   MONOABS 1.0 04/09/2020 1553   EOSABS 0.2 04/09/2020 1553   BASOSABS 0.1 04/09/2020 1553    Hgb A1C Lab Results  Component Value Date   HGBA1C 5.6 10/21/2021           Assessment & Plan:   Preventative  Health Maintenance:  Encouraged him to get a flu shot in the fall Tetanus UTD COVID-vaccine UTD Pneumovax UTD Discussed Shingrix vaccine, he will check coverage with his insurance company and schedule a visit if he would like to have this done Colon screening UTD Encouraged him to consume a balanced diet and exercise regimen Advised him to see an eye doctor and dentist annually We will check CBC, c-Met, lipid, A1c and PSA today  RTC in 2 weeks for BP check 6 months for follow-up of chronic conditions Webb Silversmith, NP

## 2022-05-02 LAB — COMPLETE METABOLIC PANEL WITH GFR
AG Ratio: 2 (calc) (ref 1.0–2.5)
ALT: 40 U/L (ref 9–46)
AST: 30 U/L (ref 10–35)
Albumin: 4.9 g/dL (ref 3.6–5.1)
Alkaline phosphatase (APISO): 62 U/L (ref 35–144)
BUN: 24 mg/dL (ref 7–25)
CO2: 25 mmol/L (ref 20–32)
Calcium: 10.5 mg/dL — ABNORMAL HIGH (ref 8.6–10.3)
Chloride: 103 mmol/L (ref 98–110)
Creat: 1 mg/dL (ref 0.70–1.30)
Globulin: 2.5 g/dL (calc) (ref 1.9–3.7)
Glucose, Bld: 92 mg/dL (ref 65–99)
Potassium: 5 mmol/L (ref 3.5–5.3)
Sodium: 139 mmol/L (ref 135–146)
Total Bilirubin: 0.6 mg/dL (ref 0.2–1.2)
Total Protein: 7.4 g/dL (ref 6.1–8.1)
eGFR: 91 mL/min/{1.73_m2} (ref >=60)

## 2022-05-02 LAB — DRUG MONITORING, PANEL 8 WITH CONFIRMATION, URINE
6 Acetylmorphine: NEGATIVE ng/mL (ref <10)
Alcohol Metabolites: NEGATIVE ng/mL (ref <500)
Amphetamines: NEGATIVE ng/mL (ref <500)
Benzodiazepines: NEGATIVE ng/mL (ref <100)
Buprenorphine, Urine: NEGATIVE ng/mL (ref <5)
Cocaine Metabolite: NEGATIVE ng/mL (ref <150)
Creatinine: 81.5 mg/dL (ref >=20.0)
MDMA: NEGATIVE ng/mL (ref <500)
Marijuana Metabolite: NEGATIVE ng/mL (ref <20)
Opiates: NEGATIVE ng/mL (ref <100)
Oxidant: NEGATIVE ug/mL (ref <200)
Oxycodone: NEGATIVE ng/mL (ref <100)
pH: 5.1 (ref 4.5–9.0)

## 2022-05-02 LAB — LIPID PANEL
Cholesterol: 153 mg/dL (ref <200)
HDL: 77 mg/dL (ref >=40)
LDL Cholesterol (Calc): 52 mg/dL (calc)
Non-HDL Cholesterol (Calc): 76 mg/dL (calc) (ref <130)
Total CHOL/HDL Ratio: 2 (calc) (ref <5.0)
Triglycerides: 163 mg/dL — ABNORMAL HIGH (ref <150)

## 2022-05-02 LAB — CBC
HCT: 46.9 % (ref 38.5–50.0)
Hemoglobin: 15.5 g/dL (ref 13.2–17.1)
MCH: 30.5 pg (ref 27.0–33.0)
MCHC: 33 g/dL (ref 32.0–36.0)
MCV: 92.3 fL (ref 80.0–100.0)
MPV: 10.8 fL (ref 7.5–12.5)
Platelets: 391 10*3/uL (ref 140–400)
RBC: 5.08 10*6/uL (ref 4.20–5.80)
RDW: 12.5 % (ref 11.0–15.0)
WBC: 20.8 10*3/uL — ABNORMAL HIGH (ref 3.8–10.8)

## 2022-05-02 LAB — DM TEMPLATE

## 2022-05-02 LAB — HEMOGLOBIN A1C
Hgb A1c MFr Bld: 5.9 % of total Hgb — ABNORMAL HIGH (ref <5.7)
Mean Plasma Glucose: 123 mg/dL
eAG (mmol/L): 6.8 mmol/L

## 2022-05-02 LAB — PSA: PSA: 1.93 ng/mL (ref <=4.00)

## 2022-05-06 ENCOUNTER — Ambulatory Visit
Admission: RE | Admit: 2022-05-06 | Discharge: 2022-05-06 | Disposition: A | Discharge status: Home / Self Care | Payer: 59 | Source: Home / Self Care | Attending: Family Medicine | Admitting: Family Medicine

## 2022-05-06 ENCOUNTER — Ambulatory Visit (INDEPENDENT_AMBULATORY_CARE_PROVIDER_SITE_OTHER): Payer: 59 | Admitting: Family Medicine

## 2022-05-06 ENCOUNTER — Encounter: Payer: Self-pay | Admitting: Family Medicine

## 2022-05-06 ENCOUNTER — Ambulatory Visit
Admission: RE | Admit: 2022-05-06 | Discharge: 2022-05-06 | Disposition: A | Discharge status: Home / Self Care | Payer: 59 | Source: Ambulatory Visit | Attending: Family Medicine | Admitting: Family Medicine

## 2022-05-06 VITALS — BP 114/66 | HR 98 | Temp 97.1°F | Wt 201.0 lb

## 2022-05-06 DIAGNOSIS — R0600 Dyspnea, unspecified: Secondary | ICD-10-CM | POA: Diagnosis not present

## 2022-05-06 DIAGNOSIS — D72829 Elevated white blood cell count, unspecified: Secondary | ICD-10-CM | POA: Diagnosis not present

## 2022-05-06 DIAGNOSIS — R0609 Other forms of dyspnea: Secondary | ICD-10-CM | POA: Insufficient documentation

## 2022-05-06 DIAGNOSIS — R918 Other nonspecific abnormal finding of lung field: Secondary | ICD-10-CM

## 2022-05-06 LAB — CBC WITH DIFFERENTIAL/PLATELET
Absolute Monocytes: 837 cells/uL (ref 200–950)
Basophils Absolute: 37 cells/uL (ref 0–200)
Basophils Relative: 0.4 %
Eosinophils Absolute: 147 cells/uL (ref 15–500)
Eosinophils Relative: 1.6 %
HCT: 42.1 % (ref 38.5–50.0)
Hemoglobin: 14.2 g/dL (ref 13.2–17.1)
Lymphs Abs: 2842.8 cells/uL (ref 850–3900)
MCH: 31 pg (ref 27.0–33.0)
MCHC: 33.7 g/dL (ref 32.0–36.0)
MCV: 91.9 fL (ref 80.0–100.0)
MPV: 10.6 fL (ref 7.5–12.5)
Monocytes Relative: 9.1 %
Neutro Abs: 5336 cells/uL (ref 1500–7800)
Neutrophils Relative %: 58 %
Platelets: 339 10*3/uL (ref 140–400)
RBC: 4.58 10*6/uL (ref 4.20–5.80)
RDW: 12.5 % (ref 11.0–15.0)
Total Lymphocyte: 30.9 %
WBC: 9.2 10*3/uL (ref 3.8–10.8)

## 2022-05-06 LAB — BRAIN NATRIURETIC PEPTIDE: Brain Natriuretic Peptide: 6 pg/mL (ref <100)

## 2022-05-06 LAB — TROPONIN I: Troponin I: 3 ng/L (ref <=47)

## 2022-05-06 LAB — D-DIMER, QUANTITATIVE: D-Dimer, Quant: 0.25 mcg/mL FEU (ref <0.50)

## 2022-05-06 MED ORDER — AZITHROMYCIN 250 MG PO TABS
ORAL_TABLET | ORAL | 0 refills | Status: AC
Start: 2022-05-06 — End: 2022-05-13
  Filled 2022-05-06: qty 6, 5d supply, fill #0

## 2022-05-06 NOTE — Progress Notes (Signed)
Subjective:    Patient ID: [name], male    DOB: 25-Dec-1970, 52 y.o.   MRN: [id number]  [name] is a 52 y.o. male presenting on 05/06/2022 for Shortness of Breath  Patient presents for a same day appointment.  PCP Webb Silversmith, FNP   HPI  Elevated WBC 20.8 Recent abnormal incidental finding on labs for annual physical last week. No prior significant recent abnormal WBC. He had one 11.5 result 2 yrs ago No concerns for infection or UTI or PNA  DYSPNEA on EXERTION Previously followed by Cardiology, last seen 10/2020 for weight loss medication GLP1, he has CAD seen on CT 2021, with history of stroke. Fam history of CAD. He was dx with Non obstructive CAD after having cardiac cath.  Reports symptoms of fatigue and shortness of breath within the past 1 week. Using smokeless tobacco. No second hand smoke or exposures Denies any chest pain or pressure Denies any recent illness cough cold congestion fever aches  CHRONIC HTN: Reports previously seen by Rollene Fare 05/01/22 Current Meds - Lisinopril 10mg  daily, Carvedilol 3.125mg  TWICE A DAY, Amlodipine 10mg  daily Reports good compliance, took meds today. Tolerating well, w/o complaints. Denies CP, dyspnea, HA, edema, dizziness / lightheadedness  Drinks large yeti container of water most days.  History of CVA without residual deficits  Insomnia Takes Clonazepam and Zolpidem CR nightly  Weight loss on GLP1 40 lbs over past 1+ year He did lose the weight and felt improved energy   Past Surgical History:  Procedure Laterality Date   ANTERIOR CERVICAL DECOMPRESSION/DISCECTOMY FUSION 4 LEVELS N/A 04/08/2018   Procedure: ACDF - C3-C4 - C4-C5 - C5-C6 - C6-C7;  Surgeon: Kary Kos, MD;  Location: [facility];  Service: Neurosurgery;  Laterality: N/A;   CARPAL TUNNEL RELEASE Left 04/03/2018   COLONOSCOPY     COLONOSCOPY  07/15/2021   ESOPHAGEAL MANOMETRY N/A 10/19/2016   Procedure: ESOPHAGEAL MANOMETRY (EM);  Surgeon: Mauri Pole, MD;  Location: [facility];  Service: Endoscopy;  Laterality: N/A;   ESOPHAGOGASTRODUODENOSCOPY     ESOPHAGOGASTRODUODENOSCOPY N/A 01/22/2017   Procedure: ESOPHAGOGASTRODUODENOSCOPY (EGD);  Surgeon: [name], MD;  Location: [facility];  Service: General;  Laterality: N/A;   [place]  09/04/2011   Procedure: HERNIA REPAIR INCISIONAL;  Surgeon: Madilyn Hook, DO;  Location: [facility];  Service: General;  Laterality: N/A;  debridment calcified mass   INSERTION OF MESH  09/04/2011   retrorectus ultrapro "30x30cm"   INSERTION OF MESH N/A 01/22/2017   Procedure: INSERTION OF MESH;  Surgeon: [name], MD;  Location: [facility];  Service: General;  Laterality: N/A;   LEFT HEART CATH AND CORONARY ANGIOGRAPHY N/A 04/11/2020   Procedure: LEFT HEART CATH AND CORONARY ANGIOGRAPHY;  Surgeon: Lorretta Harp, MD;  Location: [facility];  Service: Cardiovascular;  Laterality: N/A;   NISSEN FUNDOPLICATION     Open [id number]   POLYPECTOMY     STOMACH SURGERY  10/05/2010   Oversewing of gastric ulcer.  Dr Brantley Stage   TOTAL HIP ARTHROPLASTY Left 04/24/2014   Procedure: LEFT TOTAL HIP ARTHROPLASTY;  Surgeon: Marchia Bond, MD;  Location: [facility];  Service: Orthopedics;  Laterality: Left;   UPPER GASTROINTESTINAL ENDOSCOPY          05/01/2022   10:04 AM 10/21/2021    3:04 PM 04/10/2021    3:28 PM  Depression screen PHQ 2/9  Decreased Interest 0 0 0  Down, Depressed, Hopeless 0 0 0  PHQ - 2 Score  0 0 0  Altered sleeping  0 1  Tired, decreased energy  2 2  Change in appetite  0 0  Feeling bad or failure about yourself   0 0  Trouble concentrating  0 1  Moving slowly or fidgety/restless  1 2  Suicidal thoughts  0 0  PHQ-9 Score  3 6  Difficult doing work/chores  Not difficult at all Not difficult at all    Social History   Tobacco Use   Smoking status: Never   Smokeless tobacco: Current    Types: Chew   Tobacco comments:    occ-Chew    None on 07/15/21  Vaping Use   Vaping  Use: Never used  Substance Use Topics   Alcohol use: Yes    Alcohol/week: 0.0 standard drinks of alcohol    Comment: 09/04/11 "very seldom"   Drug use: Never    Review of Systems Per HPI unless specifically indicated above     Objective:    BP 114/66 (BP Location: Left Arm, Patient Position: Sitting, Cuff Size: Normal)   Pulse 98   Temp (!) 97.1 F (36.2 C) (Temporal)   Wt 201 lb (91.2 kg)   SpO2 99%   BMI 29.68 kg/m   Wt Readings from Last 3 Encounters:  05/06/22 201 lb (91.2 kg)  05/01/22 198 lb (89.8 kg)  03/12/22 199 lb 12.8 oz (90.6 kg)    Physical Exam Vitals and nursing note reviewed.  Constitutional:      General: He is not in acute distress.    Appearance: He is well-developed. He is not diaphoretic.     Comments: Well-appearing, comfortable, cooperative  HENT:     Head: Normocephalic and atraumatic.  Eyes:     General:        Right eye: No discharge.        Left eye: No discharge.     Conjunctiva/sclera: Conjunctivae normal.  Neck:     Thyroid: No thyromegaly.     Vascular: No carotid bruit.  Cardiovascular:     Rate and Rhythm: Normal rate and regular rhythm.     Pulses: Normal pulses.     Heart sounds: Normal heart sounds. No murmur heard. Pulmonary:     Effort: Pulmonary effort is normal. No respiratory distress.     Breath sounds: Normal breath sounds. No wheezing or rales.  Musculoskeletal:        General: Normal range of motion.     Cervical back: Normal range of motion and neck supple.     Right lower leg: No edema.     Left lower leg: No edema.  Lymphadenopathy:     Cervical: No cervical adenopathy.  Skin:    General: Skin is warm and dry.     Findings: No erythema or rash.     Comments: Small varicose veins lower extremity  Neurological:     Mental Status: He is alert and oriented to person, place, and time. Mental status is at baseline.  Psychiatric:        Behavior: Behavior normal.     Comments: Well groomed, good eye contact,  normal speech and thoughts    I have personally reviewed the radiology report from 05/06/22 on CXR STAT.  CLINICAL DATA:  Dyspnea on exertion for a week. Elevated white blood cell count   EXAM: CHEST - 2 VIEW   COMPARISON:  X-ray 04/09/2020   FINDINGS: No pneumothorax or effusion. No edema. Mild left basilar atelectasis or scar. There are  some subtle opacity in the right midlung. Acute infiltrate is possible. This was not seen previously. No pneumothorax or edema. Normal cardiopericardial silhouette. Fixation hardware along the lower cervical spine at the edge of the imaging field. There are several dilated loops of bowel throughout the upper abdomen likely small bowel with air-fluid levels. Please correlate for any history of ileus or obstruction and if needed additional workup as clinically directed such as CT scan with contrast   IMPRESSION: Subtle right midlung opacity. Subtle infiltrate is possible. Recommend follow-up.   Several dilated loops of bowel in the upper abdomen with air-fluid levels. Please correlate with any particular symptomatology and if there is further concern of obstruction or other process in the abdomen, a contrast CT scan may be of some benefit when clinically appropriate     Electronically Signed   By: Jill Side M.D.   On: 05/06/2022 16:12    EKG - performed in office today  Date: 05/06/22  Rate: 84  Rhythm: normal sinus rhythm  QRS Axis: normal  Intervals: normal  ST/T Wave abnormalities: normal  Conduction Disutrbances:none  Additional Narrative Interpretation: none  Old EKG Reviewed: unchanged compared to 04/26/20   Reports from 04/02/20 Dr Gwenlyn Found - Cardiology Cardiac Cath + ECHO  History obtained from chart review.52 y.o. 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 Presented  to [facility] with recent DOE with worsening exertional CP as well.  His enzymes were negative.  Because the nature of his symptoms and risk factors decision was made to refer to cardiac catheterization to define his anatomy and physiology.   IMPRESSION: Mr. Moresi has normal coronary arteries and normal filling pressures.  I believe his chest pain is noncardiac.  His 2D echo revealed normal LV systolic function.  The sheath was removed and a TR band was placed on the right wrist to achieve patent hemostasis.  The patient left lab in stable condition.  He will most likely be discharged home later today and will follow up as an outpatient.   Quay Burow. MD, [facility] 04/11/2020 8:19 AM   ECHOCARDIOGRAM REPORT       Patient Name:   ARACELI ORVIS Date of Exam: 04/02/2020  Medical Rec #:  [id number]     Height:       69.0 in  Accession #:    [id number]    Weight:       242.0 lb  Date of Birth:  16-Jul-1970     BSA:          2.240 m  Patient Age:    83 years      BP:           110/74 mmHg  Patient Gender: M             HR:           76 bpm.  Exam Location:  [place]   Procedure: 2D Echo, 3D Echo, Cardiac Doppler, Color Doppler and Strain  Analysis   Indications:   R06.00 Dyspnea on exertion    History:        Patient has prior history of Echocardiogram examinations,  most                 recent 10/06/2017. Stroke; Risk Factors:Hypertension and  Dyslipidemia.    Sonographer:    Jessee Avers, RDCS  Referring Phys: [id number] [place]     1. Left ventricular ejection fraction, by estimation, is 55 to 60%. The  left ventricle has normal function. The left ventricle has no regional  wall motion abnormalities. Left ventricular diastolic parameters were  normal. The average left ventricular  global longitudinal strain is -25.2 %. The global longitudinal strain is  normal.   2. Right ventricular systolic function is normal. The right ventricular  size is  normal.   3. The mitral valve is normal in structure. No evidence of mitral valve  regurgitation. No evidence of mitral stenosis.   4. The aortic valve is tricuspid. Aortic valve regurgitation is not  visualized. No aortic stenosis is present.   5. Aortic dilatation noted. There is mild dilatation of the aortic root,  measuring 41 mm.   6. The inferior vena cava is normal in size with greater than 50%  respiratory variability, suggesting right atrial pressure of 3 mmHg.   Comparison(s): 10/06/17 EF 55-60%.   FINDINGS   Left Ventricle: Left ventricular ejection fraction, by estimation, is 55  to 60%. The left ventricle has normal function. The left ventricle has no  regional wall motion abnormalities. The average left ventricular global  longitudinal strain is -25.2 %.  The global longitudinal strain is normal. The left ventricular internal  cavity size was normal in size. There is no left ventricular hypertrophy.  Left ventricular diastolic parameters were normal.   Right Ventricle: The right ventricular size is normal. No increase in  right ventricular wall thickness. Right ventricular systolic function is  normal.   Left Atrium: Left atrial size was normal in size.   Right Atrium: Right atrial size was normal in size.   Pericardium: There is no evidence of pericardial effusion.   Mitral Valve: The mitral valve is normal in structure. No evidence of  mitral valve stenosis.   Tricuspid Valve: The tricuspid valve is normal in structure. Tricuspid  valve regurgitation is not demonstrated. No evidence of tricuspid  stenosis.   Aortic Valve: The aortic valve is tricuspid. Aortic valve regurgitation is  not visualized. No aortic stenosis is present.   Pulmonic Valve: The pulmonic valve was normal in structure. Pulmonic valve  regurgitation is not visualized. No evidence of pulmonic stenosis.   Aorta: Aortic dilatation noted. There is mild dilatation of the aortic  root,  measuring 41 mm.   Venous: The inferior vena cava is normal in size with greater than 50%  respiratory variability, suggesting right atrial pressure of 3 mmHg.   IAS/Shunts: No atrial level shunt detected by color flow Doppler.   LEFT VENTRICLE  PLAX 2D  LVIDd:         4.10 cm  Diastology  LVIDs:         2.90 cm  LV e' medial:    8.92 cm/s  LV PW:         0.80 cm  LV E/e' medial:  8.8  LV IVS:        0.70 cm  LV e' lateral:   11.90 cm/s  LVOT diam:     2.60 cm  LV E/e' lateral: 6.6  LV SV:         105  LV SV Index:   47       2D Longitudinal Strain  LVOT Area:     5.31 cm 2D Strain GLS (A2C):   -  26.1 %                          2D Strain GLS (A3C):   -25.0 %                          2D Strain GLS (A4C):   -24.6 %                          2D Strain GLS Avg:     -25.2 %                            3D Volume EF:                          3D EF:        61 %                          LV EDV:       166 ml                          LV ESV:       65 ml                          LV SV:        101 ml   RIGHT VENTRICLE  RV Basal diam:  3.60 cm  RV S prime:     11.40 cm/s  TAPSE (M-mode): 2.1 cm   LEFT ATRIUM             Index       RIGHT ATRIUM           Index  LA diam:        3.30 cm 1.47 cm/m  RA Pressure: 3.00 mmHg  LA Vol (A2C):   34.0 ml 15.18 ml/m RA Area:     13.60 cm  LA Vol (A4C):   24.7 ml 11.03 ml/m RA Volume:   30.80 ml  13.75 ml/m  LA Biplane Vol: 29.7 ml 13.26 ml/m   AORTIC VALVE  LVOT Vmax:   92.00 cm/s  LVOT Vmean:  60.700 cm/s  LVOT VTI:    0.198 m    AORTA  Ao Root diam: 4.30 cm  Ao Asc diam:  3.60 cm   MITRAL VALVE               TRICUSPID VALVE                             Estimated RAP:  3.00 mmHg    MV E velocity: 78.40 cm/s  SHUNTS  MV A velocity: 68.60 cm/s  Systemic VTI:  0.20 m  MV E/A ratio:  1.14        Systemic Diam: 2.60 cm   Skeet Latch MD  Electronically signed by Skeet Latch MD  Signature Date/Time: 04/02/2020/12:40:11 PM   Results  for orders placed or performed in visit on 05/06/22  D-Dimer, Quantitative  Result Value Ref Range   D-Dimer, Quant 0.25 <0.50 mcg/mL FEU  Troponin I  Result Value Ref Range   Troponin I 3 < OR = 47 ng/L  Brain natriuretic peptide  Result Value Ref Range   Brain Natriuretic Peptide 6 <100 pg/mL  CBC with Differential/Platelet  Result Value Ref Range   WBC 9.2 3.8 - 10.8 Thousand/uL   RBC 4.58 4.20 - 5.80 Million/uL   Hemoglobin 14.2 13.2 - 17.1 g/dL   HCT 42.1 38.5 - 50.0 %   MCV 91.9 80.0 - 100.0 fL   MCH 31.0 27.0 - 33.0 pg   MCHC 33.7 32.0 - 36.0 g/dL   RDW 12.5 11.0 - 15.0 %   Platelets 339 140 - 400 Thousand/uL   MPV 10.6 7.5 - 12.5 fL   Neutro Abs 5,336 1,500 - 7,800 cells/uL   Lymphs Abs 2,842.8 850 - 3,900 cells/uL   Absolute Monocytes 837 200 - 950 cells/uL   Eosinophils Absolute 147 15 - 500 cells/uL   Basophils Absolute 37 0 - 200 cells/uL   Neutrophils Relative % 58 %   Total Lymphocyte 30.9 %   Monocytes Relative 9.1 %   Eosinophils Relative 1.6 %   Basophils Relative 0.4 %   *Note: Due to a large number of results and/or encounters for the requested time period, some results have not been displayed. A complete set of results can be found in Results Review.      Assessment & Plan:   Problem List Items Addressed This Visit   None Visit Diagnoses     Dyspnea on exertion    -  Primary   Relevant Orders   DG Chest 2 View (Completed)   D-Dimer, Quantitative (Completed)   Troponin I (Completed)   Brain natriuretic peptide (Completed)   CBC with Differential/Platelet (Completed)   Leukocytosis, unspecified type       Relevant Orders   CBC with Differential/Platelet (Completed)   Pulmonary infiltrate in right lung on CXR       Relevant Medications   azithromycin (ZITHROMAX Z-PAK) 250 MG tablet       Dyspnea on exertion, uncertain etiology  EKG today was normal. No acute ischemic issues identified.  STAT Labs ordered today, stay tuned for results. -  D-dimer, Troponin, BNP and Repeat CBC.  STAT Chest X-ray today. - Result does show a Subtle right midlung opacity. Subtle infiltrate is possible. Note this is not heard on exam and no other clinical history to support pneumonia, but will discuss with patient after hours and send results to his mychart. He may be candidate for empiric antibiotic to cover for possible pneumonia.  WBC elevation remains unclear, could be related to pneumonia STAT repeat today shows normal range now 9.2. I suspect the higher results on prior result was Hemoconcentration due to less water intake that day. He had elevated Hgb and PLT as well.  History of Blood Clot >10 years ago. Uncertain history surrounding that episode. We are checking for D-dimer as well which can determine if possible blood clot **Update result D-Dimer negative.  No Anemia on recent lab to explain dyspnea.  Troponin lab today to evaluate for any possible ischemia. I advised that we would typically recommend hospital ED evaluation sooner if significant symptoms, hemodynamic abnormal vital signs or other clinical concerns.  If any of these results are positive D-dimer, or Troponin, next step is hospital.  If worsening short of breath and not improving, I would suggest going to hospital ED for evaluation.  If you have any significant chest pain that does not go away within 30 minutes, is accompanied by nausea, sweating, shortness of breath, or made worse by activity, this  may be evidence of a heart attack, especially if symptoms worsening instead of improving, please call 911 or go directly to the emergency room immediately for evaluation  -----  703pm called patient discussed all results CXR, D-Dimer, CBC, Troponin, BNP  Reassurance given on labs. Negative d-dimer, normalized CBC. Negative Troponin and BNP. Subtle infiltrate on R midlung, will order Zpak azithromycin as empiric coverage, he can pick up tomorrow and consider taking it. Still  seems uncertain as the etiology but we will cover for possible infection.   Orders Placed This Encounter  Procedures   DG Chest 2 View    Standing Status:   Future    Number of Occurrences:   1    Standing Expiration Date:   05/06/2023    Order Specific Question:   Reason for Exam (SYMPTOM  OR DIAGNOSIS REQUIRED)    Answer:   dyspnea on exertion 1 week, elevated WBC. asymptomatic otherwise    Order Specific Question:   Preferred imaging location?    Answer:   [facility]   D-Dimer, Quantitative   Troponin I   Brain natriuretic peptide   CBC with Differential/Platelet    Meds ordered this encounter  Medications   azithromycin (ZITHROMAX Z-PAK) 250 MG tablet    Sig: Take 2 tabs (500mg  total) on Day 1. Take 1 tab (250mg ) daily for next 4 days.    Dispense:  6 tablet    Refill:  0      Follow up plan: Return if symptoms worsen or fail to improve.    Nobie Putnam, [facility] 05/06/2022, 3:28 PM

## 2022-05-06 NOTE — Patient Instructions (Addendum)
Thank you for coming to the office today.  EKG today was normal.  Labs ordered today, stay tuned for results.  Chest X-ray today, results on mychart.  White blood cells elevated, this may be false elevation since we cannot find an answer. I will repeat the lab.  We are checking for D-dimer as well which can determine if possible blood clot  If any of these results are positive D-dimer, or Troponin, next step is hospital.  If worsening short of breath and not improving, I would suggest going to hospital ED for evaluation.  If you have any significant chest pain that does not go away within 30 minutes, is accompanied by nausea, sweating, shortness of breath, or made worse by activity, this may be evidence of a heart attack, especially if symptoms worsening instead of improving, please call 911 or go directly to the emergency room immediately for evaluation.   Please schedule a Follow-up Appointment to: Return if symptoms worsen or fail to improve.  If you have any other questions or concerns, please feel free to call the office or send a message through [place]. You may also schedule an earlier appointment if necessary.  Additionally, you may be receiving a survey about your experience at our office within a few days to 1 week by e-mail or mail. We value your feedback.  Nobie Putnam, DO [place]

## 2022-05-07 ENCOUNTER — Other Ambulatory Visit (HOSPITAL_COMMUNITY): Payer: Self-pay

## 2022-05-15 ENCOUNTER — Encounter: Payer: Self-pay | Admitting: Internal Medicine

## 2022-05-15 ENCOUNTER — Ambulatory Visit (INDEPENDENT_AMBULATORY_CARE_PROVIDER_SITE_OTHER): Payer: 59 | Admitting: Internal Medicine

## 2022-05-15 VITALS — BP 112/70 | HR 104 | Temp 96.8°F | Wt 201.0 lb

## 2022-05-15 DIAGNOSIS — I10 Essential (primary) hypertension: Secondary | ICD-10-CM | POA: Diagnosis not present

## 2022-05-15 DIAGNOSIS — Z23 Encounter for immunization: Secondary | ICD-10-CM

## 2022-05-15 DIAGNOSIS — E663 Overweight: Secondary | ICD-10-CM

## 2022-05-15 DIAGNOSIS — Z6829 Body mass index (BMI) 29.0-29.9, adult: Secondary | ICD-10-CM

## 2022-05-15 NOTE — Assessment & Plan Note (Signed)
Encourage diet and exercise for weight loss 

## 2022-05-15 NOTE — Assessment & Plan Note (Signed)
Controlled on amlodipine, lisinopril and carvedilol Reinforced DASH diet and exercise for weight loss

## 2022-05-15 NOTE — Progress Notes (Signed)
Subjective:    Patient ID: [name], male    DOB: 01-07-1971, 52 y.o.   MRN: [id number]  HPI  Patient presents to clinic today for 2-week follow-up of HTN.  At his last visit, his BP was elevated because he had not been taking his Amlodipine, Carvedilol, Lisinopril as prescribed.  He has restarted these medications.  His BP today is 112/70.  ECG from 04/2020 reviewed.  Review of Systems  Past Medical History:  Diagnosis Date   Anxiety    takes Xanax daily prn, panic attacks   Chronic gastric ulcer with bleeding s/p suture repair 2012 09/29/2010   Head 3 EGD which failed to stop the bleeding. Had exploratory laparotomy on 10/04/2010. Possible cause of the gastric ulcer was NSAID use.    Clotting disorder    Constipation    related to pain meds   Depression    Diabetes mellitus without complication    Dizziness    occasionally   Dyspnea    walking a bit   Erosive esophagitis    External hemorrhoid, bleeding    Gastric ulcer    history of   GERD (gastroesophageal reflux disease)    takes Protonix bid   Hiatal hernia    History of blood clots 2012   in abdomen   History of blood transfusion 2012   Hyperlipidemia    Hypertension    Incisional hernia s/p open repair w mesh Aug 2013 05/26/2011   Joint pain    knees   Nocturia    depends on amount of fluid he drinks   Pneumonia 2018   Pre-diabetes    Primary localized osteoarthritis of left hip 04/24/2014   Serrated polyp of colon    Sleep apnea    no cpap  mild no cpap needed   Stroke    Unstable angina 04/09/2020   Upper GI bleed    Ventral hernia     Current Outpatient Medications  Medication Sig Dispense Refill   albuterol (VENTOLIN HFA) 108 (90 Base) MCG/ACT inhaler Inhale 1-2 puffs into the lungs every 4 (four) hours as needed for wheezing or shortness of breath 6.7 g 0   amLODipine (NORVASC) 10 MG tablet Take 1 tablet (10 mg total) by mouth daily. Please call our to schedule an overdue appointment with  Dr. Shari Prows before anymore refills. [phone]. Thank you. 3rd attempt. 90 tablet 0   aspirin EC 81 MG EC tablet Take 1 tablet (81 mg total) by mouth daily.     carvedilol (COREG) 3.125 MG tablet Take 1 tablet (3.125 mg total) by mouth 2 (two) times daily with a meal. 180 tablet 0   clonazePAM (KLONOPIN) 0.5 MG tablet Take 1 tablet (0.5 mg total) by mouth at bedtime. 30 tablet 0   cyclobenzaprine (FLEXERIL) 10 MG tablet Take 1 tablet (10 mg total) by mouth 2 (two) times daily as needed. 60 tablet 0   fluticasone (FLONASE) 50 MCG/ACT nasal spray Place 2 sprays into both nostrils daily as needed for allergies. 16 g 11   gabapentin (NEURONTIN) 300 MG capsule Take 1 capsule (300 mg total) by mouth 3 (three) times daily. 90 capsule 5   lisinopril (ZESTRIL) 10 MG tablet Take 1 tablet (10 mg total) by mouth daily. Please call our to schedule an overdue appointment with Dr. Shari Prows before anymore refills. [phone]. Thank you. 3rd and final attempt. 90 tablet 0   ondansetron (ZOFRAN) 4 MG tablet Take 1 tablet (4 mg total) by mouth every 8 (  eight) hours as needed for nausea or vomiting. 20 tablet 1   pantoprazole (PROTONIX) 40 MG tablet Take 1 tablet (40 mg total) by mouth 2 (two) times daily. 60 tablet 2   rosuvastatin (CRESTOR) 5 MG tablet Take 1 tablet (5 mg total) by mouth daily. 90 tablet 1   Semaglutide-Weight Management 2.4 MG/0.75ML SOAJ Inject 2.4 mg into the skin once a week. 3 mL 11   Topiramate ER (TROKENDI XR) 50 MG CP24 Take 1 capsule (50 mg) by mouth at bedtime. 30 capsule 5   zolpidem (AMBIEN CR) 12.5 MG CR tablet Take 1 tablet (12.5 mg total) by mouth at bedtime. 30 tablet 0   No current facility-administered medications for this visit.    Allergies  Allergen Reactions   Dilaudid [Hydromorphone Hcl] Rash and Other (See Comments)    Shakey   Hydromorphone Hcl Other (See Comments) and Rash    Shakey   Metoclopramide Other (See Comments), Rash, Swelling and Hives    Swelling in  lips   Metoclopramide Hcl Hives, Swelling, Rash and Other (See Comments)    Swelling in lips   Nsaids     H/o bleeding gastric ulcers   Benadryl [Diphenhydramine]     Causes patient to become hyper and pace    Family History  Problem Relation Age of Onset   Diabetes Mother    Liver disease Mother    Diabetes Father    Heart disease Father    Healthy Sister    Healthy Brother    Healthy Brother    Colon cancer Neg Hx    Stomach cancer Neg Hx    Rectal cancer Neg Hx    Esophageal cancer Neg Hx    Colon polyps Neg Hx    Prostate cancer Neg Hx     Social History   Socioeconomic History   Marital status: Single    Spouse name: Not on file   Number of children: 1   Years of education: 12   Highest education level: 12th grade  Occupational History   Occupation: Maintenance -   Tobacco Use   Smoking status: Never   Smokeless tobacco: Current    Types: Chew   Tobacco comments:    occ-Chew    None on 07/15/21  Vaping Use   Vaping Use: Never used  Substance and Sexual Activity   Alcohol use: Yes    Alcohol/week: 0.0 standard drinks of alcohol    Comment: 09/04/11 "very seldom"   Drug use: Never   Sexual activity: Yes  Other Topics Concern   Not on file  Social History Narrative   Lives with brother in a one story home.  Has one daughter.  Works in Production designer, theatre/television/film for [employer].  Education: high school.       Patient is left-handed.   Social Determinants of Health   Financial Resource Strain: Low Risk  (05/01/2022)   Overall Financial Resource Strain (CARDIA)    Difficulty of Paying Living Expenses: Not very hard  Food Insecurity: Food Insecurity Present (05/01/2022)   Hunger Vital Sign    Worried About Running Out of Food in the Last Year: Sometimes true    Ran Out of Food in the Last Year: Sometimes true  Transportation Needs: No Transportation Needs (05/01/2022)   PRAPARE - Administrator, Civil Service (Medical): No    Lack of Transportation  (Non-Medical): No  Physical Activity: Sufficiently Active (05/01/2022)   Exercise Vital Sign    Days of  Exercise per Week: 4 days    Minutes of Exercise per Session: 60 min  Stress: No Stress Concern Present (05/01/2022)   [employer] of Occupational Health - Occupational Stress Questionnaire    Feeling of Stress : Only a little  Social Connections: Moderately Isolated (05/01/2022)   Social Connection and Isolation Panel [NHANES]    Frequency of Communication with Friends and Family: More than three times a week    Frequency of Social Gatherings with Friends and Family: Once a week    Attends Religious Services: 1 to 4 times per year    Active Member of [employer] or Organizations: No    Attends Engineer, structural: Not on file    Marital Status: Divorced  Intimate Partner Violence: Not on file     Constitutional: Pt reports headaches. Denies fever, malaise, fatigue, or abrupt weight changes.  Respiratory: Denies difficulty breathing, shortness of breath, cough or sputum production.   Cardiovascular: Denies chest pain, chest tightness, palpitations or swelling in the hands or feet.  Musculoskeletal: Denies decrease in range of motion, difficulty with gait, muscle pain or joint pain and swelling.  Skin: Denies redness, rashes, lesions or ulcercations.  Neurological: Pt reports insomnia, paresthesia of hands. Denies dizziness, difficulty with memory, difficulty with speech or problems with balance and coordination.  Psych: Pt has a history of anxiety. Denies depression, SI/HI.  No other specific complaints in a complete review of systems (except as listed in HPI above).     Objective:   Physical Exam   BP 112/70 (BP Location: Left Arm, Patient Position: Sitting, Cuff Size: Normal)   Pulse (!) 104   Temp (!) 96.8 F (36 C) (Temporal)   Wt 201 lb (91.2 kg)   SpO2 99%   BMI 29.68 kg/m   Wt Readings from Last 3 Encounters:  05/06/22 201 lb (91.2 kg)  05/01/22 198 lb  (89.8 kg)  03/12/22 199 lb 12.8 oz (90.6 kg)    General: Appears his  stated age, overweight, in NAD. Skin: Warm, dry and intact.  HEENT: Head: normal shape and size; Eyes: sclera white, no icterus, conjunctiva pink, PERRLA and EOMs intact;  Cardiovascular: Tachycardic with normal rhythm. S1,S2 noted.  No murmur, rubs or gallops noted.  Pulmonary/Chest: Normal effort and positive vesicular breath sounds. No respiratory distress. No wheezes, rales or ronchi noted.  Musculoskeletal:  No difficulty with gait.  Neurological: Alert and oriented. Coordination normal.      BMET    Component Value Date/Time   NA 139 05/01/2022 0952   NA 142 04/26/2020 0849   K 5.0 05/01/2022 0952   CL 103 05/01/2022 0952   CO2 25 05/01/2022 0952   GLUCOSE 92 05/01/2022 0952   BUN 24 05/01/2022 0952   BUN 18 04/26/2020 0849   CREATININE 1.00 05/01/2022 0952   CALCIUM 10.5 (H) 05/01/2022 0952   GFRNONAA >60 04/11/2020 0320   GFRAA 96 12/12/2019 1134    Lipid Panel     Component Value Date/Time   CHOL 153 05/01/2022 0952   CHOL 112 11/28/2020 0746   TRIG 163 (H) 05/01/2022 0952   HDL 77 05/01/2022 0952   HDL 60 11/28/2020 0746   CHOLHDL 2.0 05/01/2022 0952   VLDL 32 04/10/2020 0402   LDLCALC 52 05/01/2022 0952    CBC    Component Value Date/Time   WBC 9.2 05/06/2022 1601   RBC 4.58 05/06/2022 1601   HGB 14.2 05/06/2022 1601   HCT 42.1 05/06/2022 1601   PLT  339 05/06/2022 1601   MCV 91.9 05/06/2022 1601   MCH 31.0 05/06/2022 1601   MCHC 33.7 05/06/2022 1601   RDW 12.5 05/06/2022 1601   LYMPHSABS 2,842.8 05/06/2022 1601   MONOABS 1.0 04/09/2020 1553   EOSABS 147 05/06/2022 1601   BASOSABS 37 05/06/2022 1601    Hgb A1C Lab Results  Component Value Date   HGBA1C 5.9 (H) 05/01/2022           Assessment & Plan:     RTC in 5 months, follow up chronic conditions Nicki Reaper, NP

## 2022-05-15 NOTE — Addendum Note (Signed)
Addended by: Kavin Leech E on: 05/15/2022 11:49 AM   Modules accepted: Orders

## 2022-05-15 NOTE — Patient Instructions (Signed)

## 2022-05-28 ENCOUNTER — Other Ambulatory Visit (HOSPITAL_COMMUNITY): Payer: Self-pay

## 2022-05-28 ENCOUNTER — Other Ambulatory Visit: Payer: Self-pay | Admitting: Internal Medicine

## 2022-05-28 DIAGNOSIS — K219 Gastro-esophageal reflux disease without esophagitis: Secondary | ICD-10-CM

## 2022-05-28 MED ORDER — ONDANSETRON HCL 4 MG PO TABS
4.0000 mg | ORAL_TABLET | Freq: Three times a day (TID) | ORAL | 1 refills | Status: DC | PRN
  Filled 2022-05-28: qty 20, 7d supply, fill #0
  Filled 2022-07-30: qty 20, 7d supply, fill #1

## 2022-05-28 MED ORDER — PANTOPRAZOLE SODIUM 40 MG PO TBEC
40.0000 mg | DELAYED_RELEASE_TABLET | Freq: Two times a day (BID) | ORAL | 1 refills | Status: AC
Start: 2022-05-28 — End: ?
  Filled 2022-05-28: qty 180, 90d supply, fill #0
  Filled 2022-08-28: qty 180, 90d supply, fill #1

## 2022-05-28 MED ORDER — CYCLOBENZAPRINE HCL 10 MG PO TABS
10.0000 mg | ORAL_TABLET | Freq: Two times a day (BID) | ORAL | 0 refills | Status: DC | PRN
  Filled 2022-05-28: qty 60, 30d supply, fill #0

## 2022-05-28 MED ORDER — ZOLPIDEM TARTRATE ER 12.5 MG PO TBCR
12.5000 mg | EXTENDED_RELEASE_TABLET | Freq: Every day | ORAL | 0 refills | Status: DC
  Filled 2022-05-28 – 2022-06-01 (×2): qty 30, 30d supply, fill #0

## 2022-05-28 MED ORDER — CLONAZEPAM 0.5 MG PO TABS
0.5000 mg | ORAL_TABLET | Freq: Every day | ORAL | 0 refills | Status: DC
  Filled 2022-05-28 – 2022-06-01 (×2): qty 30, 30d supply, fill #0

## 2022-05-28 NOTE — Telephone Encounter (Signed)
Requested medication (s) are due for refill today: yes  Requested medication (s) are on the active medication list: yes  Last refill:  multiple dates  Future visit scheduled: yes  Notes to clinic:  Unable to refill per protocol, cannot delegate.      Requested Prescriptions  Pending Prescriptions Disp Refills   ondansetron (ZOFRAN) 4 MG tablet 20 tablet 1    Sig: Take 1 tablet (4 mg total) by mouth every 8 (eight) hours as needed for nausea or vomiting.     Not Delegated - Gastroenterology: Antiemetics - ondansetron Failed - 05/28/2022 11:44 AM      Failed - This refill cannot be delegated      Passed - AST in normal range and within 360 days    AST  Date Value Ref Range Status  05/01/2022 30 10 - 35 U/L Final         Passed - ALT in normal range and within 360 days    ALT  Date Value Ref Range Status  05/01/2022 40 9 - 46 U/L Final         Passed - Valid encounter within last 6 months    Recent Outpatient Visits           1 week ago Primary hypertension   [place] [facility] [place], Salvadore Oxford, NP   3 weeks ago Dyspnea on exertion   [place] [facility] [place], Netta Neat, DO   3 weeks ago Encounter for general adult medical examination with abnormal findings   [place] [facility] [place], [place] W, NP   7 months ago Type 2 diabetes mellitus without complication, without long-term current use of insulin [facility])   [facility] [place], Salvadore Oxford, NP   1 year ago Screening for colon cancer   [place] [facility] [place], Salvadore Oxford, NP       Future Appointments             In 4 months [place], Salvadore Oxford, NP [place] [facility]             pantoprazole (PROTONIX) 40 MG tablet 60 tablet 2    Sig: Take 1 tablet (40 mg total) by mouth 2 (two) times daily.     Gastroenterology: Proton Pump Inhibitors Passed - 05/28/2022 11:44 AM      Passed -  Valid encounter within last 12 months    Recent Outpatient Visits           1 week ago Primary hypertension   [place] [facility] [place], Salvadore Oxford, NP   3 weeks ago Dyspnea on exertion   [facility] [place], Netta Neat, DO   3 weeks ago Encounter for general adult medical examination with abnormal findings   [place] [facility] [place], [place], NP   7 months ago Type 2 diabetes mellitus without complication, without long-term current use of insulin [facility])   [place] [facility] [place], Salvadore Oxford, NP   1 year ago Screening for colon cancer   [place] [place], Salvadore Oxford, NP       Future Appointments             In 4 months [place], Salvadore Oxford, NP [place] [facility], [facility]  clonazePAM (KLONOPIN) 0.5 MG tablet 30 tablet 0    Sig: Take 1 tablet (0.5 mg total) by mouth at bedtime.     Not Delegated - Psychiatry: Anxiolytics/Hypnotics 2 Failed - 05/28/2022 11:44 AM      Failed - This refill cannot be delegated      Failed - Urine Drug Screen completed in last 360 days      Passed - Patient is not pregnant      Passed - Valid encounter within last 6 months    Recent Outpatient Visits           1 week ago Primary hypertension   [place] [facility] [place], Salvadore Oxford, NP   3 weeks ago Dyspnea on exertion   [place] [facility] [place], Netta Neat, DO   3 weeks ago Encounter for general adult medical examination with abnormal findings   [place] [facility] [place], [place] W, NP   7 months ago Type 2 diabetes mellitus without complication, without long-term current use of insulin [facility])   [place] [facility] [place], Salvadore Oxford, NP   1 year ago Screening for colon cancer   [place] [facility] [place], Salvadore Oxford, NP       Future Appointments              In 4 months [place], Salvadore Oxford, NP [facility], [facility]             zolpidem (AMBIEN CR) 12.5 MG CR tablet 30 tablet 0    Sig: Take 1 tablet (12.5 mg total) by mouth at bedtime.     Not Delegated - Psychiatry:  Anxiolytics/Hypnotics Failed - 05/28/2022 11:44 AM      Failed - This refill cannot be delegated      Failed - Urine Drug Screen completed in last 360 days      Passed - Valid encounter within last 6 months    Recent Outpatient Visits           1 week ago Primary hypertension   [place] [facility] [place], Salvadore Oxford, NP   3 weeks ago Dyspnea on exertion   [place] [facility] [place], Netta Neat, DO   3 weeks ago Encounter for general adult medical examination with abnormal findings   [place] [facility] [place], [place] W, NP   7 months ago Type 2 diabetes mellitus without complication, without long-term current use of insulin [facility])   [facility] [place], Salvadore Oxford, NP   1 year ago Screening for colon cancer   [place] [facility] [place], Salvadore Oxford, NP       Future Appointments             In 4 months Baity, Salvadore Oxford, NP [place] [facility], [facility]             cyclobenzaprine (FLEXERIL) 10 MG tablet 60 tablet 0    Sig: Take 1 tablet (10 mg total) by mouth 2 (two) times daily as needed.     Not Delegated - Analgesics:  Muscle Relaxants Failed - 05/28/2022 11:44 AM      Failed - This refill cannot be delegated      Passed - Valid encounter within last 6 months    Recent Outpatient Visits  1 week ago Primary hypertension   [place] [facility] [place], Salvadore Oxford, NP   3 weeks ago Dyspnea on exertion   [place] [facility] [place], Netta Neat, DO   3 weeks ago Encounter for general adult medical examination with abnormal findings   [place] [facility] [place], [place], NP   7 months ago Type 2 diabetes mellitus without complication, without long-term current use of insulin [facility])   [place] [facility] [place], Salvadore Oxford, NP   1 year ago Screening for colon cancer   [place] [facility] [place], Salvadore Oxford, NP       Future Appointments             In 4 months Baity, Salvadore Oxford, NP [place] [facility], [facility]

## 2022-06-01 ENCOUNTER — Other Ambulatory Visit: Payer: Self-pay

## 2022-06-01 ENCOUNTER — Other Ambulatory Visit (HOSPITAL_COMMUNITY): Payer: Self-pay

## 2022-06-09 ENCOUNTER — Other Ambulatory Visit (HOSPITAL_COMMUNITY): Payer: Self-pay

## 2022-06-23 ENCOUNTER — Other Ambulatory Visit (HOSPITAL_COMMUNITY): Payer: Self-pay

## 2022-06-23 ENCOUNTER — Other Ambulatory Visit: Payer: Self-pay | Admitting: Internal Medicine

## 2022-06-23 MED ORDER — ROSUVASTATIN CALCIUM 5 MG PO TABS
5.0000 mg | ORAL_TABLET | Freq: Every day | ORAL | 3 refills | Status: DC
  Filled 2022-06-23: qty 90, 90d supply, fill #0
  Filled 2022-09-29: qty 90, 90d supply, fill #1
  Filled 2022-12-22: qty 90, 90d supply, fill #2
  Filled 2023-04-02: qty 90, 90d supply, fill #3

## 2022-06-23 NOTE — Telephone Encounter (Signed)
Requested Prescriptions  Pending Prescriptions Disp Refills   rosuvastatin (CRESTOR) 5 MG tablet 90 tablet 3    Sig: Take 1 tablet (5 mg total) by mouth daily.     Cardiovascular:  Antilipid - Statins 2 Failed - 06/23/2022 10:05 AM      Failed - Lipid Panel in normal range within the last 12 months    Cholesterol, Total  Date Value Ref Range Status  11/28/2020 112 100 - 199 mg/dL Final   Cholesterol  Date Value Ref Range Status  05/01/2022 153 <200 mg/dL Final   LDL Cholesterol (Calc)  Date Value Ref Range Status  05/01/2022 52 mg/dL (calc) Final    Comment:    Reference range: <100 . Desirable range <100 mg/dL for primary prevention;   <70 mg/dL for patients with CHD or diabetic patients  with > or = 2 CHD risk factors. Marland Kitchen LDL-C is now calculated using the Martin-Hopkins  calculation, which is a validated novel method providing  better accuracy than the Friedewald equation in the  estimation of LDL-C.  Horald Pollen et al. Lenox Ahr. 1610;960(45): 2061-2068  (http://education.Quest[employer].com/faq/FAQ164)    HDL  Date Value Ref Range Status  05/01/2022 77 > OR = 40 mg/dL Final  40/98/1191 60 >47 mg/dL Final   Triglycerides  Date Value Ref Range Status  05/01/2022 163 (H) <150 mg/dL Final         Passed - Cr in normal range and within 360 days    Creat  Date Value Ref Range Status  05/01/2022 1.00 0.70 - 1.30 mg/dL Final   Creatinine, Urine  Date Value Ref Range Status  10/21/2021 182 20 - 320 mg/dL Final         Passed - Patient is not pregnant      Passed - Valid encounter within last 12 months    Recent Outpatient Visits           1 month ago Primary hypertension   [place] [facility] [place], Salvadore Oxford, NP   1 month ago Dyspnea on exertion   [place] [facility] [place], Netta Neat, DO   1 month ago Encounter for general adult medical examination with abnormal findings   [place] [facility]  [place], Salvadore Oxford, NP   8 months ago Type 2 diabetes mellitus without complication, without long-term current use of insulin [facility])   [facility] [place], Salvadore Oxford, NP   1 year ago Screening for colon cancer   [place] [facility] [place], Salvadore Oxford, NP       Future Appointments             In 3 months Baity, Salvadore Oxford, NP [facility], [facility]

## 2022-06-27 ENCOUNTER — Other Ambulatory Visit (HOSPITAL_COMMUNITY): Payer: Self-pay

## 2022-06-29 ENCOUNTER — Other Ambulatory Visit: Payer: Self-pay

## 2022-06-30 ENCOUNTER — Other Ambulatory Visit: Payer: Self-pay

## 2022-06-30 ENCOUNTER — Other Ambulatory Visit: Payer: Self-pay | Admitting: Internal Medicine

## 2022-06-30 ENCOUNTER — Other Ambulatory Visit (HOSPITAL_COMMUNITY): Payer: Self-pay

## 2022-07-01 ENCOUNTER — Other Ambulatory Visit (HOSPITAL_COMMUNITY): Payer: Self-pay

## 2022-07-01 NOTE — Telephone Encounter (Signed)
Requested medication (s) are due for refill today: yes  Requested medication (s) are on the active medication list: yes  Last refill:  05/28/22  Future visit scheduled: yes  Notes to clinic:  Unable to refill per protocol, cannot delegate.      Requested Prescriptions  Pending Prescriptions Disp Refills   clonazePAM (KLONOPIN) 0.5 MG tablet 30 tablet 0    Sig: Take 1 tablet (0.5 mg total) by mouth at bedtime.     Not Delegated - Psychiatry: Anxiolytics/Hypnotics 2 Failed - 06/30/2022  9:35 AM      Failed - This refill cannot be delegated      Failed - Urine Drug Screen completed in last 360 days      Passed - Patient is not pregnant      Passed - Valid encounter within last 6 months    Recent Outpatient Visits           1 month ago Primary hypertension   [facility] [place], Salvadore Oxford, NP   1 month ago Dyspnea on exertion   [place] [facility] [place], Netta Neat, DO   2 months ago Encounter for general adult medical examination with abnormal findings   [place] [facility] [place], [place] W, NP   8 months ago Type 2 diabetes mellitus without complication, without long-term current use of insulin [facility])   [place] [facility] [place], Salvadore Oxford, NP   1 year ago Screening for colon cancer   [place] [facility] [place], Salvadore Oxford, NP       Future Appointments             In 3 months Baity, Salvadore Oxford, NP [place] [facility]             zolpidem (AMBIEN CR) 12.5 MG CR tablet 30 tablet 0    Sig: Take 1 tablet (12.5 mg total) by mouth at bedtime.     Not Delegated - Psychiatry:  Anxiolytics/Hypnotics Failed - 06/30/2022  9:35 AM      Failed - This refill cannot be delegated      Failed - Urine Drug Screen completed in last 360 days      Passed - Valid encounter within last 6 months    Recent Outpatient Visits           1 month ago Primary  hypertension   [place] [facility] [place], Salvadore Oxford, NP   1 month ago Dyspnea on exertion   [facility] [place], Netta Neat, DO   2 months ago Encounter for general adult medical examination with abnormal findings   [place] [facility] [place], [place] W, NP   8 months ago Type 2 diabetes mellitus without complication, without long-term current use of insulin [facility])   [place] [facility] [place], Salvadore Oxford, NP   1 year ago Screening for colon cancer   [place] [facility] [place], Salvadore Oxford, NP       Future Appointments             In 3 months Baity, Salvadore Oxford, NP [facility]             cyclobenzaprine (FLEXERIL) 10 MG tablet 60 tablet 0    Sig: Take 1 tablet (10 mg total) by mouth 2 (two)  times daily as needed.     Not Delegated - Analgesics:  Muscle Relaxants Failed - 06/30/2022  9:35 AM      Failed - This refill cannot be delegated      Passed - Valid encounter within last 6 months    Recent Outpatient Visits           1 month ago Primary hypertension   [place] [facility] [place], Salvadore Oxford, NP   1 month ago Dyspnea on exertion   [place] [facility] [place] [place], Netta Neat, DO   2 months ago Encounter for general adult medical examination with abnormal findings   [place] [facility] [place], Salvadore Oxford, NP   8 months ago Type 2 diabetes mellitus without complication, without long-term current use of insulin [facility] [place])   [place] [facility] [place], Salvadore Oxford, NP   1 year ago Screening for colon cancer   [place] [facility] [place], Salvadore Oxford, NP       Future Appointments             In 3 months Baity, Salvadore Oxford, NP [place] [facility], [facility]

## 2022-07-02 ENCOUNTER — Other Ambulatory Visit (HOSPITAL_COMMUNITY): Payer: Self-pay

## 2022-07-02 MED ORDER — ZOLPIDEM TARTRATE ER 12.5 MG PO TBCR
12.5000 mg | EXTENDED_RELEASE_TABLET | Freq: Every day | ORAL | 0 refills | Status: DC
  Filled 2022-07-02: qty 30, 30d supply, fill #0

## 2022-07-02 MED ORDER — CYCLOBENZAPRINE HCL 10 MG PO TABS
10.0000 mg | ORAL_TABLET | Freq: Two times a day (BID) | ORAL | 0 refills | Status: DC | PRN
  Filled 2022-07-02: qty 60, 30d supply, fill #0

## 2022-07-02 MED ORDER — CLONAZEPAM 0.5 MG PO TABS
0.5000 mg | ORAL_TABLET | Freq: Every day | ORAL | 0 refills | Status: DC
  Filled 2022-07-02: qty 30, 30d supply, fill #0

## 2022-07-06 ENCOUNTER — Ambulatory Visit: Payer: No Typology Code available for payment source | Admitting: Neurology

## 2022-07-24 ENCOUNTER — Other Ambulatory Visit (HOSPITAL_COMMUNITY): Payer: Self-pay

## 2022-07-24 ENCOUNTER — Other Ambulatory Visit: Payer: Self-pay

## 2022-07-24 ENCOUNTER — Other Ambulatory Visit: Payer: Self-pay | Admitting: Neurology

## 2022-07-24 MED ORDER — TOPIRAMATE ER 50 MG PO CAP24
50.0000 mg | ORAL_CAPSULE | Freq: Every day | ORAL | 5 refills | Status: DC
  Filled 2022-07-24: qty 30, 30d supply, fill #0
  Filled 2022-08-28: qty 30, 30d supply, fill #1
  Filled 2022-09-29: qty 30, 30d supply, fill #2
  Filled 2022-10-29: qty 30, 30d supply, fill #3
  Filled 2022-12-28: qty 30, 30d supply, fill #4
  Filled 2023-01-28: qty 30, 30d supply, fill #5

## 2022-07-30 ENCOUNTER — Other Ambulatory Visit: Payer: Self-pay | Admitting: Internal Medicine

## 2022-07-30 ENCOUNTER — Other Ambulatory Visit: Payer: Self-pay

## 2022-07-31 ENCOUNTER — Other Ambulatory Visit: Payer: Self-pay | Admitting: Internal Medicine

## 2022-07-31 ENCOUNTER — Other Ambulatory Visit (HOSPITAL_COMMUNITY): Payer: Self-pay

## 2022-07-31 MED ORDER — ZOLPIDEM TARTRATE ER 12.5 MG PO TBCR
12.5000 mg | EXTENDED_RELEASE_TABLET | Freq: Every day | ORAL | 0 refills | Status: DC
  Filled 2022-07-31: qty 30, 30d supply, fill #0

## 2022-07-31 MED ORDER — CLONAZEPAM 0.5 MG PO TABS
0.5000 mg | ORAL_TABLET | Freq: Every day | ORAL | 0 refills | Status: DC
  Filled 2022-07-31: qty 30, 30d supply, fill #0

## 2022-07-31 NOTE — Telephone Encounter (Signed)
Requested medication (s) are due for refill today: Yes  Requested medication (s) are on the active medication list: Yes  Last refill:  07/02/22  Future visit scheduled: Yes  Notes to clinic:  Not delegated.    Requested Prescriptions  Pending Prescriptions Disp Refills   clonazePAM (KLONOPIN) 0.5 MG tablet 30 tablet 0    Sig: Take 1 tablet (0.5 mg total) by mouth at bedtime.     Not Delegated - Psychiatry: Anxiolytics/Hypnotics 2 Failed - 07/30/2022 10:33 AM      Failed - This refill cannot be delegated      Failed - Urine Drug Screen completed in last 360 days      Passed - Patient is not pregnant      Passed - Valid encounter within last 6 months    Recent Outpatient Visits           2 months ago Primary hypertension   [place] [facility], Salvadore Oxford, NP   2 months ago Dyspnea on exertion   [facility], Netta Neat, DO   3 months ago Encounter for general adult medical examination with abnormal findings   [place] [facility], Salvadore Oxford, NP   9 months ago Type 2 diabetes mellitus without complication, without long-term current use of insulin [facility])   [place] [facility] [place], Salvadore Oxford, NP   1 year ago Screening for colon cancer   [place] [facility] [place], Salvadore Oxford, NP       Future Appointments             In 2 months [place], Salvadore Oxford, NP [facility]             zolpidem (AMBIEN CR) 12.5 MG CR tablet 30 tablet 0    Sig: Take 1 tablet (12.5 mg total) by mouth at bedtime.     Not Delegated - Psychiatry:  Anxiolytics/Hypnotics Failed - 07/30/2022 10:33 AM      Failed - This refill cannot be delegated      Failed - Urine Drug Screen completed in last 360 days      Passed - Valid encounter within last 6 months    Recent Outpatient Visits           2 months ago Primary hypertension   [place] [facility] [place], Salvadore Oxford, NP   2 months ago Dyspnea on exertion   [facility] [place], Netta Neat, DO   3 months ago Encounter for general adult medical examination with abnormal findings   [place] [facility] [place], Salvadore Oxford, NP   9 months ago Type 2 diabetes mellitus without complication, without long-term current use of insulin [facility])   [place] [facility] [place], Salvadore Oxford, NP   1 year ago Screening for colon cancer   [place] [facility] [facility] [place], Salvadore Oxford, NP       Future Appointments             In 2 months Baity, Salvadore Oxford, NP [facility], [facility]

## 2022-08-03 ENCOUNTER — Other Ambulatory Visit (HOSPITAL_COMMUNITY): Payer: Self-pay

## 2022-08-03 ENCOUNTER — Other Ambulatory Visit: Payer: Self-pay | Admitting: Internal Medicine

## 2022-08-03 MED ORDER — CYCLOBENZAPRINE HCL 10 MG PO TABS
10.0000 mg | ORAL_TABLET | Freq: Two times a day (BID) | ORAL | 0 refills | Status: DC | PRN
  Filled 2022-08-03: qty 60, 30d supply, fill #0

## 2022-08-03 NOTE — Telephone Encounter (Signed)
Requested medication (s) are due for refill today: yes  Requested medication (s) are on the active medication list: yes  Last refill:  07/02/22 #60  Future visit scheduled: yes  Notes to clinic:  med not delegated to NT to RF   Requested Prescriptions  Pending Prescriptions Disp Refills   cyclobenzaprine (FLEXERIL) 10 MG tablet 60 tablet 0    Sig: Take 1 tablet (10 mg total) by mouth 2 (two) times daily as needed.     Not Delegated - Analgesics:  Muscle Relaxants Failed - 07/31/2022 11:37 AM      Failed - This refill cannot be delegated      Passed - Valid encounter within last 6 months    Recent Outpatient Visits           2 months ago Primary hypertension   [place] [facility] [place], Salvadore Oxford, NP   2 months ago Dyspnea on exertion   [place] [facility] [place], Netta Neat, DO   3 months ago Encounter for general adult medical examination with abnormal findings   [place] [facility] [place], Salvadore Oxford, NP   9 months ago Type 2 diabetes mellitus without complication, without long-term current use of insulin [facility])   [place] [facility] [place], Salvadore Oxford, NP   1 year ago Screening for colon cancer   [place] [place], Salvadore Oxford, NP       Future Appointments             In 2 months Baity, Salvadore Oxford, NP  [facility], [facility]

## 2022-08-04 ENCOUNTER — Other Ambulatory Visit (HOSPITAL_COMMUNITY): Payer: Self-pay

## 2022-08-04 ENCOUNTER — Other Ambulatory Visit: Payer: Self-pay

## 2022-08-04 MED ORDER — LISINOPRIL 10 MG PO TABS
10.0000 mg | ORAL_TABLET | Freq: Every day | ORAL | 0 refills | Status: DC
  Filled 2022-08-04: qty 90, 90d supply, fill #0

## 2022-08-04 MED ORDER — AMLODIPINE BESYLATE 10 MG PO TABS
10.0000 mg | ORAL_TABLET | Freq: Every day | ORAL | 0 refills | Status: DC
  Filled 2022-08-04 – 2022-08-28 (×2): qty 90, 90d supply, fill #0

## 2022-08-04 NOTE — Telephone Encounter (Signed)
Requested Prescriptions  Pending Prescriptions Disp Refills   amLODipine (NORVASC) 10 MG tablet 90 tablet 0    Sig: Take 1 tablet (10 mg total) by mouth daily. Please call our to schedule an overdue appointment with Dr. Shari Prows before anymore refills. [phone]. Thank you. 3rd attempt.     Cardiovascular: Calcium Channel Blockers 2 Passed - 08/03/2022  1:56 PM      Passed - Last BP in normal range    BP Readings from Last 1 Encounters:  05/15/22 112/70         Passed - Last Heart Rate in normal range    Pulse Readings from Last 1 Encounters:  05/15/22 (!) 104         Passed - Valid encounter within last 6 months    Recent Outpatient Visits           2 months ago Primary hypertension   [place] [facility] [place], Salvadore Oxford, NP   3 months ago Dyspnea on exertion   [place] [facility] [place], Netta Neat, DO   3 months ago Encounter for general adult medical examination with abnormal findings   [place] [place], Salvadore Oxford, NP   9 months ago Type 2 diabetes mellitus without complication, without long-term current use of insulin [facility])   [place] [facility] [place], Salvadore Oxford, NP   1 year ago Screening for colon cancer   [place] [facility] [place], Salvadore Oxford, NP       Future Appointments             In 2 months [place], Salvadore Oxford, NP [facility]             lisinopril (ZESTRIL) 10 MG tablet 90 tablet 0    Sig: Take 1 tablet (10 mg total) by mouth daily. Please call our to schedule an overdue appointment with Dr. Shari Prows before anymore refills. [phone]. Thank you. 3rd and final attempt.     Cardiovascular:  ACE Inhibitors Passed - 08/03/2022  1:56 PM      Passed - Cr in normal range and within 180 days    Creat  Date Value Ref Range Status  05/01/2022 1.00 0.70 - 1.30 mg/dL Final   Creatinine, Urine  Date Value Ref Range Status   10/21/2021 182 20 - 320 mg/dL Final         Passed - K in normal range and within 180 days    Potassium  Date Value Ref Range Status  05/01/2022 5.0 3.5 - 5.3 mmol/L Final         Passed - Patient is not pregnant      Passed - Last BP in normal range    BP Readings from Last 1 Encounters:  05/15/22 112/70         Passed - Valid encounter within last 6 months    Recent Outpatient Visits           2 months ago Primary hypertension   [place] [facility] [place], Salvadore Oxford, NP   3 months ago Dyspnea on exertion   [facility] [place], Netta Neat, DO   3 months ago Encounter for general adult medical examination with abnormal findings    [place], Salvadore Oxford, NP   9 months ago Type 2 diabetes mellitus without complication, without long-term current  use of insulin [facility])   [place] [facility] [place], Salvadore Oxford, NP   1 year ago Screening for colon cancer   [place] [facility] [place], Salvadore Oxford, NP       Future Appointments             In 2 months Baity, Salvadore Oxford, NP  [facility], [facility]

## 2022-08-17 ENCOUNTER — Other Ambulatory Visit (HOSPITAL_COMMUNITY): Payer: Self-pay

## 2022-08-27 ENCOUNTER — Other Ambulatory Visit (HOSPITAL_COMMUNITY): Payer: Self-pay

## 2022-08-27 ENCOUNTER — Telehealth: Payer: 59 | Admitting: Nurse Practitioner

## 2022-08-27 DIAGNOSIS — H60502 Unspecified acute noninfective otitis externa, left ear: Secondary | ICD-10-CM

## 2022-08-27 MED ORDER — CIPROFLOXACIN-DEXAMETHASONE 0.3-0.1 % OT SUSP
4.0000 [drp] | Freq: Two times a day (BID) | OTIC | 0 refills | Status: AC
Start: 2022-08-27 — End: 2022-09-03
  Filled 2022-08-27: qty 7.5, 7d supply, fill #0

## 2022-08-27 NOTE — Progress Notes (Signed)
E Visit for Ear Pain - Swimmer's Ear/ external ear infection   We are sorry that you are not feeling well. Here is how we plan to help!   We are prescribing an ear drop for you to use   Meds ordered this encounter  Medications   ciprofloxacin-dexamethasone (CIPRODEX) OTIC suspension    Sig: Place 4 drops into the left ear 2 (two) times daily for 7 days.    Dispense:  7.5 mL    Refill:  0    Based on what you have shared with me it looks like you have Swimmer's Ear.  Swimmer's ear is a redness or swelling, irritation, or infection of your outer ear canal. These symptoms usually occur within a few days of swimming. Your ear canal is a tube that goes from the opening of the ear to the eardrum.  When water stays in your ear canal, germs can grow.  This is a painful condition that often happens to children and swimmers of all ages.  It is not contagious and oral antibiotics are not required to treat uncomplicated swimmer's ear.  The usual symptoms include:    Itchiness inside the ear  Redness or a sense of swelling in the ear  Pain when the ear is tugged on when pressure is placed on the ear  Pus draining from the infected ear     In certain cases, swimmer's ear may progress to a more serious bacterial infection of the middle or inner ear.  If you have a fever 102 and up and significantly worsening symptoms, this could indicate a more serious infection moving to the middle/inner and needs face to face evaluation in an office by a provider.  Your symptoms should improve over the next 3 days and should resolve in about 7 days.  Be sure to complete ALL of your prescription.  HOME CARE: Wash your hands frequently. If you are prescribed an ear drop, do not place the tip of the bottle on your ear or touch it with your fingers. You can take Acetaminophen 650 mg every 4-6 hours as needed for pain.  If pain is severe or moderate, you can apply a heating pad (set on low) or hot water bottle (wrapped  in a towel) to outer ear for 20 minutes.  This will also increase drainage. Avoid ear plugs Do not go swimming until the symptoms are gone Do not use Q-tips After showers, help the water run out by tilting your head to one side.   GET HELP RIGHT AWAY IF: Fever is over 102.2 degrees. You develop progressive ear pain or hearing loss. Ear symptoms persist longer than 3 days after treatment.  MAKE SURE YOU: Understand these instructions. Will watch your condition. Will get help right away if you are not doing well or get worse.  TO PREVENT SWIMMER'S EAR: Use a bathing cap or custom fitted swim molds to keep your ears dry. Towel off after swimming to dry your ears. Tilt your head or pull your earlobes to allow the water to escape your ear canal. If there is still water in your ears, consider using a hairdryer on the lowest setting.  Thank you for choosing an e-visit.  Your e-visit answers were reviewed by a board certified advanced clinical practitioner to complete your personal care plan. Depending upon the condition, your plan could have included both over the counter or prescription medications.  Please review your pharmacy choice. Make sure the pharmacy is open so you can  pick up the prescription now. If there is a problem, you may contact your provider through [employer] and have the prescription routed to another pharmacy.  Your safety is important to [place]. If you have drug allergies check your prescription carefully.   For the next 24 hours you can use MyChart to ask questions about today's visit, request a non-urgent call back, or ask for a work or school excuse. You will get an email with a survey after your eVisit asking about your experience. We would appreciate your feedback. I hope that your e-visit has been valuable and will aid in your recovery.  I spent approximately 5 minutes reviewing the patient's history, current symptoms and coordinating their care today.

## 2022-08-28 ENCOUNTER — Other Ambulatory Visit (HOSPITAL_COMMUNITY): Payer: Self-pay

## 2022-08-28 ENCOUNTER — Other Ambulatory Visit: Payer: Self-pay | Admitting: Internal Medicine

## 2022-08-28 ENCOUNTER — Other Ambulatory Visit: Payer: Self-pay

## 2022-08-28 MED ORDER — CLONAZEPAM 0.5 MG PO TABS
0.5000 mg | ORAL_TABLET | Freq: Every day | ORAL | 0 refills | Status: DC
  Filled 2022-08-28: qty 30, 30d supply, fill #0

## 2022-08-28 MED ORDER — ZOLPIDEM TARTRATE ER 12.5 MG PO TBCR
12.5000 mg | EXTENDED_RELEASE_TABLET | Freq: Every day | ORAL | 0 refills | Status: DC
  Filled 2022-08-28: qty 30, 30d supply, fill #0

## 2022-08-28 NOTE — Telephone Encounter (Signed)
Requested medication (s) are due for refill today: Yes  Requested medication (s) are on the active medication list: Yes  Last refill:  07/31/22  Future visit scheduled: Yes  Notes to clinic:  Not delegated.    Requested Prescriptions  Pending Prescriptions Disp Refills   clonazePAM (KLONOPIN) 0.5 MG tablet 30 tablet 0    Sig: Take 1 tablet (0.5 mg total) by mouth at bedtime.     Not Delegated - Psychiatry: Anxiolytics/Hypnotics 2 Failed - 08/28/2022 11:00 AM      Failed - This refill cannot be delegated      Failed - Urine Drug Screen completed in last 360 days      Passed - Patient is not pregnant      Passed - Valid encounter within last 6 months    Recent Outpatient Visits           3 months ago Primary hypertension   [place] [facility] [place], Salvadore Oxford, NP   3 months ago Dyspnea on exertion   [place] [facility] [place], Netta Neat, DO   3 months ago Encounter for general adult medical examination with abnormal findings   [place] [facility] [place], Salvadore Oxford, NP   10 months ago Type 2 diabetes mellitus without complication, without long-term current use of insulin [facility])   [place] [facility] [place], Salvadore Oxford, NP   1 year ago Screening for colon cancer   [place] [facility] [place], Salvadore Oxford, NP       Future Appointments             In 1 month [place], Salvadore Oxford, NP [place] [facility]             zolpidem (AMBIEN CR) 12.5 MG CR tablet 30 tablet 0    Sig: Take 1 tablet (12.5 mg total) by mouth at bedtime.     Not Delegated - Psychiatry:  Anxiolytics/Hypnotics Failed - 08/28/2022 11:00 AM      Failed - This refill cannot be delegated      Failed - Urine Drug Screen completed in last 360 days      Passed - Valid encounter within last 6 months    Recent Outpatient Visits           3 months ago Primary hypertension   [place] [facility] [place], Salvadore Oxford, NP   3 months ago Dyspnea on exertion   [place] [place], Netta Neat, DO   3 months ago Encounter for general adult medical examination with abnormal findings   [place] [facility] [place], Salvadore Oxford, NP   10 months ago Type 2 diabetes mellitus without complication, without long-term current use of insulin [facility])   [place] [facility] [place], Salvadore Oxford, NP   1 year ago Screening for colon cancer    [facility], Salvadore Oxford, NP       Future Appointments             In 1 month Baity, Salvadore Oxford, NP  [facility], [facility]

## 2022-08-29 ENCOUNTER — Other Ambulatory Visit (HOSPITAL_COMMUNITY): Payer: Self-pay

## 2022-08-31 ENCOUNTER — Other Ambulatory Visit: Payer: Self-pay

## 2022-08-31 ENCOUNTER — Other Ambulatory Visit (HOSPITAL_COMMUNITY): Payer: Self-pay

## 2022-08-31 ENCOUNTER — Other Ambulatory Visit: Payer: Self-pay | Admitting: Internal Medicine

## 2022-08-31 MED ORDER — CYCLOBENZAPRINE HCL 10 MG PO TABS
10.0000 mg | ORAL_TABLET | Freq: Two times a day (BID) | ORAL | 0 refills | Status: DC | PRN
  Filled 2022-08-31: qty 60, 30d supply, fill #0

## 2022-08-31 NOTE — Telephone Encounter (Signed)
Requested medication (s) are due for refill today -yes  Requested medication (s) are on the active medication list -yes  Future visit scheduled -yes  Last refill: 08/03/22 #60   Notes to clinic: non delegated Rx  Requested Prescriptions  Pending Prescriptions Disp Refills   cyclobenzaprine (FLEXERIL) 10 MG tablet 60 tablet 0    Sig: Take 1 tablet (10 mg total) by mouth 2 (two) times daily as needed.     Not Delegated - Analgesics:  Muscle Relaxants Failed - 08/31/2022  8:30 AM      Failed - This refill cannot be delegated      Passed - Valid encounter within last 6 months    Recent Outpatient Visits           3 months ago Primary hypertension   [place] [place], Salvadore Oxford, NP   3 months ago Dyspnea on exertion   [place] [facility] [place], Netta Neat, DO   4 months ago Encounter for general adult medical examination with abnormal findings   [place] [facility] [place], Salvadore Oxford, NP   10 months ago Type 2 diabetes mellitus without complication, without long-term current use of insulin [facility])   [place] [facility] [place], Salvadore Oxford, NP   1 year ago Screening for colon cancer   [place] [place], Salvadore Oxford, NP       Future Appointments             In 1 month [place], Salvadore Oxford, NP [place] [place], [facility]               Requested Prescriptions  Pending Prescriptions Disp Refills   cyclobenzaprine (FLEXERIL) 10 MG tablet 60 tablet 0    Sig: Take 1 tablet (10 mg total) by mouth 2 (two) times daily as needed.     Not Delegated - Analgesics:  Muscle Relaxants Failed - 08/31/2022  8:30 AM      Failed - This refill cannot be delegated      Passed - Valid encounter within last 6 months    Recent Outpatient Visits           3 months ago Primary hypertension   [place] [facility] [place], Salvadore Oxford, NP   3 months ago  Dyspnea on exertion   [place] [facility] [place], Netta Neat, DO   4 months ago Encounter for general adult medical examination with abnormal findings   [place] [facility] [place], Salvadore Oxford, NP   10 months ago Type 2 diabetes mellitus without complication, without long-term current use of insulin [facility])   [place] [facility] [place], Salvadore Oxford, NP   1 year ago Screening for colon cancer   [place] [facility] [place], Salvadore Oxford, NP       Future Appointments             In 1 month Baity, Salvadore Oxford, NP [facility], [facility]

## 2022-09-29 ENCOUNTER — Other Ambulatory Visit: Payer: Self-pay

## 2022-09-29 ENCOUNTER — Other Ambulatory Visit: Payer: Self-pay | Admitting: Internal Medicine

## 2022-09-29 ENCOUNTER — Other Ambulatory Visit (HOSPITAL_COMMUNITY): Payer: Self-pay

## 2022-09-30 ENCOUNTER — Other Ambulatory Visit (HOSPITAL_COMMUNITY): Payer: Self-pay

## 2022-09-30 MED ORDER — ZOLPIDEM TARTRATE ER 12.5 MG PO TBCR
12.5000 mg | EXTENDED_RELEASE_TABLET | Freq: Every day | ORAL | 0 refills | Status: AC
Start: 2022-09-30 — End: ?
  Filled 2022-09-30: qty 30, 30d supply, fill #0

## 2022-09-30 MED ORDER — CARVEDILOL 3.125 MG PO TABS
3.1250 mg | ORAL_TABLET | Freq: Two times a day (BID) | ORAL | 0 refills | Status: DC
  Filled 2022-09-30: qty 180, 90d supply, fill #0

## 2022-09-30 MED ORDER — CLONAZEPAM 0.5 MG PO TABS
0.5000 mg | ORAL_TABLET | Freq: Every day | ORAL | 0 refills | Status: AC
Start: 2022-09-30 — End: ?
  Filled 2022-09-30: qty 30, 30d supply, fill #0

## 2022-09-30 NOTE — Telephone Encounter (Signed)
Requested medication (s) are due for refill today:   Provider to review  Requested medication (s) are on the active medication list:   Yes for all 3  Future visit scheduled:   Yes 9/12   Last ordered: Coreg 05/01/2022 #180, 0 refills;   Klonopin 08/28/2022 #30, 0 refills;   Ambien 08/28/2022 #30, 0 refills  Returned because non delegated refills    Requested Prescriptions  Pending Prescriptions Disp Refills   carvedilol (COREG) 3.125 MG tablet 180 tablet 0    Sig: Take 1 tablet (3.125 mg total) by mouth 2 (two) times daily with a meal.     Cardiovascular: Beta Blockers 3 Passed - 09/29/2022 10:20 AM      Passed - Cr in normal range and within 360 days    Creat  Date Value Ref Range Status  05/01/2022 1.00 0.70 - 1.30 mg/dL Final   Creatinine, Urine  Date Value Ref Range Status  10/21/2021 182 20 - 320 mg/dL Final         Passed - AST in normal range and within 360 days    AST  Date Value Ref Range Status  05/01/2022 30 10 - 35 U/L Final         Passed - ALT in normal range and within 360 days    ALT  Date Value Ref Range Status  05/01/2022 40 9 - 46 U/L Final         Passed - Last BP in normal range    BP Readings from Last 1 Encounters:  05/15/22 112/70         Passed - Last Heart Rate in normal range    Pulse Readings from Last 1 Encounters:  05/15/22 (!) 104         Passed - Valid encounter within last 6 months    Recent Outpatient Visits           4 months ago Primary hypertension   [place] [facility] [place], Salvadore Oxford, NP   4 months ago Dyspnea on exertion   [place] [facility] [place], Netta Neat, DO   5 months ago Encounter for general adult medical examination with abnormal findings   [place] [facility] [place], [place], NP   11 months ago Type 2 diabetes mellitus without complication, without long-term current use of insulin [facility])   [place] [facility] [place], Salvadore Oxford, NP   1 year ago Screening for colon cancer   [place] [facility] [place], Salvadore Oxford, NP       Future Appointments             In 2 weeks Lorre Munroe, NP  [place] [facility], [facility]             clonazePAM (KLONOPIN) 0.5 MG tablet 30 tablet 0    Sig: Take 1 tablet (0.5 mg total) by mouth at bedtime.     Not Delegated - Psychiatry: Anxiolytics/Hypnotics 2 Failed - 09/29/2022 10:20 AM      Failed - This refill cannot be delegated      Failed - Urine Drug Screen completed in last 360 days      Passed - Patient is not pregnant      Passed - Valid encounter within last 6 months    Recent Outpatient Visits           4 months ago Primary hypertension  [place] [facility] [place], Salvadore Oxford, NP   4 months ago Dyspnea on exertion   [place] [facility] [place], Netta Neat, DO   5 months ago Encounter for general adult medical examination with abnormal findings   [place] [facility] [place], [place], NP   11 months ago Type 2 diabetes mellitus without complication, without long-term current use of insulin [facility])   [place] [facility] [place], Salvadore Oxford, NP   1 year ago Screening for colon cancer   [place] [facility] [place], Salvadore Oxford, NP       Future Appointments             In 2 weeks Lorre Munroe, NP [place] [facility], [facility]             zolpidem (AMBIEN CR) 12.5 MG CR tablet 30 tablet 0    Sig: Take 1 tablet (12.5 mg total) by mouth at bedtime.     Not Delegated - Psychiatry:  Anxiolytics/Hypnotics Failed - 09/29/2022 10:20 AM      Failed - This refill cannot be delegated      Failed - Urine Drug Screen completed in last 360 days      Passed - Valid encounter within last 6 months    Recent Outpatient Visits           4 months ago Primary hypertension   [place] [facility] [place],  Salvadore Oxford, NP   4 months ago Dyspnea on exertion   [place] [facility] [place], Netta Neat, DO   5 months ago Encounter for general adult medical examination with abnormal findings   [place] [facility] [place], Salvadore Oxford, NP   11 months ago Type 2 diabetes mellitus without complication, without long-term current use of insulin [facility])   [place] [facility] [place], Salvadore Oxford, NP   1 year ago Screening for colon cancer   [place] [facility] [place], Salvadore Oxford, NP       Future Appointments             In 2 weeks [place], Salvadore Oxford, NP [place] [facility], [facility]

## 2022-10-02 ENCOUNTER — Other Ambulatory Visit: Payer: Self-pay | Admitting: Internal Medicine

## 2022-10-02 ENCOUNTER — Other Ambulatory Visit (HOSPITAL_COMMUNITY): Payer: Self-pay

## 2022-10-02 MED ORDER — CYCLOBENZAPRINE HCL 10 MG PO TABS
10.0000 mg | ORAL_TABLET | Freq: Two times a day (BID) | ORAL | 0 refills | Status: DC | PRN
  Filled 2022-10-02: qty 60, 30d supply, fill #0

## 2022-10-02 NOTE — Telephone Encounter (Signed)
Requested medication (s) are due for refill today - yes  Requested medication (s) are on the active medication list -yes  Future visit scheduled -yes  Last refill: 08/31/22 #60  Notes to clinic: non delegated Rx  Requested Prescriptions  Pending Prescriptions Disp Refills   cyclobenzaprine (FLEXERIL) 10 MG tablet 60 tablet 0    Sig: Take 1 tablet (10 mg total) by mouth 2 (two) times daily as needed.     Not Delegated - Analgesics:  Muscle Relaxants Failed - 10/02/2022 12:18 PM      Failed - This refill cannot be delegated      Passed - Valid encounter within last 6 months    Recent Outpatient Visits           4 months ago Primary hypertension   [place] [facility] [place], Salvadore Oxford, NP   4 months ago Dyspnea on exertion   [place] [facility] [place], Netta Neat, DO   5 months ago Encounter for general adult medical examination with abnormal findings   [place] [facility], Salvadore Oxford, NP   11 months ago Type 2 diabetes mellitus without complication, without long-term current use of insulin [facility])   [place] [facility] [place], Salvadore Oxford, NP   1 year ago Screening for colon cancer   [place] [facility] [place], Salvadore Oxford, NP       Future Appointments             In 1 week Lorre Munroe, NP [place] [facility]               Requested Prescriptions  Pending Prescriptions Disp Refills   cyclobenzaprine (FLEXERIL) 10 MG tablet 60 tablet 0    Sig: Take 1 tablet (10 mg total) by mouth 2 (two) times daily as needed.     Not Delegated - Analgesics:  Muscle Relaxants Failed - 10/02/2022 12:18 PM      Failed - This refill cannot be delegated      Passed - Valid encounter within last 6 months    Recent Outpatient Visits           4 months ago Primary hypertension   [place] [facility] [place], Salvadore Oxford, NP   4 months ago  Dyspnea on exertion   [place] [facility] [place], Netta Neat, DO   5 months ago Encounter for general adult medical examination with abnormal findings   [place] [facility] [place], Salvadore Oxford, NP   11 months ago Type 2 diabetes mellitus without complication, without long-term current use of insulin [facility])   [place] [facility] [place], Salvadore Oxford, NP   1 year ago Screening for colon cancer   [place] [facility] [place], Salvadore Oxford, NP       Future Appointments             In 1 week [place], Salvadore Oxford, NP  [facility]

## 2022-10-15 ENCOUNTER — Encounter: Payer: Self-pay | Admitting: Internal Medicine

## 2022-10-15 ENCOUNTER — Other Ambulatory Visit: Payer: Self-pay | Admitting: Neurology

## 2022-10-15 ENCOUNTER — Ambulatory Visit (INDEPENDENT_AMBULATORY_CARE_PROVIDER_SITE_OTHER): Payer: 59 | Admitting: Internal Medicine

## 2022-10-15 VITALS — BP 132/80 | HR 89 | Temp 95.7°F | Wt 209.0 lb

## 2022-10-15 DIAGNOSIS — E1169 Type 2 diabetes mellitus with other specified complication: Secondary | ICD-10-CM | POA: Diagnosis not present

## 2022-10-15 DIAGNOSIS — Z23 Encounter for immunization: Secondary | ICD-10-CM

## 2022-10-15 DIAGNOSIS — R519 Headache, unspecified: Secondary | ICD-10-CM | POA: Diagnosis not present

## 2022-10-15 DIAGNOSIS — F419 Anxiety disorder, unspecified: Secondary | ICD-10-CM | POA: Diagnosis not present

## 2022-10-15 DIAGNOSIS — F5101 Primary insomnia: Secondary | ICD-10-CM | POA: Diagnosis not present

## 2022-10-15 DIAGNOSIS — E6609 Other obesity due to excess calories: Secondary | ICD-10-CM

## 2022-10-15 DIAGNOSIS — E785 Hyperlipidemia, unspecified: Secondary | ICD-10-CM

## 2022-10-15 DIAGNOSIS — I10 Essential (primary) hypertension: Secondary | ICD-10-CM

## 2022-10-15 DIAGNOSIS — Z6830 Body mass index (BMI) 30.0-30.9, adult: Secondary | ICD-10-CM

## 2022-10-15 DIAGNOSIS — K2100 Gastro-esophageal reflux disease with esophagitis, without bleeding: Secondary | ICD-10-CM

## 2022-10-15 DIAGNOSIS — G5603 Carpal tunnel syndrome, bilateral upper limbs: Secondary | ICD-10-CM | POA: Diagnosis not present

## 2022-10-15 DIAGNOSIS — Z8673 Personal history of transient ischemic attack (TIA), and cerebral infarction without residual deficits: Secondary | ICD-10-CM | POA: Diagnosis not present

## 2022-10-15 DIAGNOSIS — E119 Type 2 diabetes mellitus without complications: Secondary | ICD-10-CM

## 2022-10-15 LAB — POCT GLYCOSYLATED HEMOGLOBIN (HGB A1C): HbA1c, POC (prediabetic range): 6.2 % (ref 5.7–6.4)

## 2022-10-15 NOTE — Assessment & Plan Note (Signed)
Avoid foods that trigger reflux Encourage weight loss as this can help reduce reflux symptoms Continue pantoprazole

## 2022-10-15 NOTE — Assessment & Plan Note (Signed)
No residual effect C-Met lipid profile today Continue amlodipine, lisinopril, carvedilol, aspirin and rosuvastatin

## 2022-10-15 NOTE — Assessment & Plan Note (Signed)
POCT A1c 6.2% We will check urine microalbumin Encouraged to consume a low-carb diet and exercise for weight loss Will request copy of eye exam Encouraged routine foot exam

## 2022-10-15 NOTE — Assessment & Plan Note (Signed)
Controlled on amlodipine, lisinopril and carvedilol Reinforced DASH diet and exercise for weight loss C-Met today

## 2022-10-15 NOTE — Assessment & Plan Note (Signed)
C-Met and lipid profile today  encouraged to consume a low-fat diet Continue rosuvastatin

## 2022-10-15 NOTE — Patient Instructions (Signed)

## 2022-10-15 NOTE — Assessment & Plan Note (Signed)
Encourage diet and exercise for weight loss 

## 2022-10-15 NOTE — Progress Notes (Signed)
Subjective:    Patient ID: [name], male    DOB: Jul 29, 1970, 52 y.o.   MRN: [id number]  HPI  Patient presents the clinic today for follow-up of chronic conditions.  HTN: His BP today is 132/80.  He is taking amlodipine, lisinopril and carvedilol as prescribed.  ECG from 05/2022 reviewed.  HLD with unstable angina, history of stroke: No residual effect.  His last LDL was 52, triglycerides 782, 04/2022.  He reports myalgias but is not sure if this is related to his rosuvastatin or his carpal tunnel syndrome.  He is taking aspirin, amlodipine, lisinopril and carvedilol as prescribed.  He tries to consume a low-fat diet.  He follows with neurology.  GERD: Triggered by tomato based foods and spicy foods.  He denies pantoprazole.  Upper GI from 09/2016 reviewed.  DM2: His last A1c was 5.9%, 04/2022.  He is not taking any diabetic medication at this time.  His sugars range from 70-1 30.  He checks his feet routinely.  His last eye exam was less than 1 year ago[facility]  Flu 10/2021.  Pneumovax 04/2021.  COVID Pfizer x 3.  Anxiety: Chronic, managed on clonazepam as needed.  He is not currently seeing a therapist.  He denies depression, SI/HI.  Insomnia: He has difficulty falling and staying asleep.  He is taking Ambien as prescribed.  Sleep study from 08/2014 reviewed.  Frequent headaches: These occur a few times per month.  He is not sure what triggers them.  He takes Topamax only as needed.  He follows with neurology.  Bilateral carpal tunnel: Status post surgery.  He takes cyclobenzaprine and gabapentin as needed with some relief of symptoms.  He follows with neurology.  Review of Systems     Past Medical History:  Diagnosis Date   Anxiety    takes Xanax daily prn, panic attacks   Chronic gastric ulcer with bleeding s/p suture repair 2012 09/29/2010   Head 3 EGD which failed to stop the bleeding. Had exploratory laparotomy on 10/04/2010. Possible cause of the gastric ulcer was  NSAID use.    Clotting disorder (HCC)    Constipation    related to pain meds   Depression    Diabetes mellitus without complication (HCC)    Dizziness    occasionally   Dyspnea    walking a bit   Erosive esophagitis    External hemorrhoid, bleeding    Gastric ulcer    history of   GERD (gastroesophageal reflux disease)    takes Protonix bid   Hiatal hernia    History of blood clots 2012   in abdomen   History of blood transfusion 2012   Hyperlipidemia    Hypertension    Incisional hernia s/p open repair w mesh Aug 2013 05/26/2011   Joint pain    knees   Nocturia    depends on amount of fluid he drinks   Pneumonia 2018   Pre-diabetes    Primary localized osteoarthritis of left hip 04/24/2014   Serrated polyp of colon    Sleep apnea    no cpap  mild no cpap needed   Stroke [facility])    Unstable angina (HCC) 04/09/2020   Upper GI bleed    Ventral hernia     Current Outpatient Medications  Medication Sig Dispense Refill   albuterol (VENTOLIN HFA) 108 (90 Base) MCG/ACT inhaler Inhale 1-2 puffs into the lungs every 4 (four) hours as needed for wheezing or shortness of breath 6.7  g 0   amLODipine (NORVASC) 10 MG tablet Take 1 tablet (10 mg total) by mouth daily. Please call our to schedule an overdue appointment with Dr. Shari Prows before anymore refills. [phone]. Thank you. 3rd attempt. 90 tablet 0   aspirin EC 81 MG EC tablet Take 1 tablet (81 mg total) by mouth daily.     carvedilol (COREG) 3.125 MG tablet Take 1 tablet (3.125 mg total) by mouth 2 (two) times daily with a meal. 180 tablet 0   clonazePAM (KLONOPIN) 0.5 MG tablet Take 1 tablet (0.5 mg total) by mouth at bedtime. 30 tablet 0   cyclobenzaprine (FLEXERIL) 10 MG tablet Take 1 tablet (10 mg total) by mouth 2 (two) times daily as needed. 60 tablet 0   fluticasone (FLONASE) 50 MCG/ACT nasal spray Place 2 sprays into both nostrils daily as needed for allergies. 16 g 11   gabapentin (NEURONTIN) 300 MG capsule Take 1  capsule (300 mg total) by mouth 3 (three) times daily. 90 capsule 5   lisinopril (ZESTRIL) 10 MG tablet Take 1 tablet (10 mg total) by mouth daily. Please call our to schedule an overdue appointment with Dr. Shari Prows before anymore refills. [phone]. Thank you. 3rd and final attempt. 90 tablet 0   ondansetron (ZOFRAN) 4 MG tablet Take 1 tablet (4 mg total) by mouth every 8 (eight) hours as needed for nausea or vomiting. 20 tablet 1   pantoprazole (PROTONIX) 40 MG tablet Take 1 tablet (40 mg total) by mouth 2 (two) times daily. 180 tablet 1   rosuvastatin (CRESTOR) 5 MG tablet Take 1 tablet (5 mg total) by mouth daily. 90 tablet 3   Semaglutide-Weight Management 2.4 MG/0.75ML SOAJ Inject 2.4 mg into the skin once a week. 3 mL 11   Topiramate ER (TROKENDI XR) 50 MG CP24 Take 1 capsule (50 mg) by mouth at bedtime. 30 capsule 5   zolpidem (AMBIEN CR) 12.5 MG CR tablet Take 1 tablet (12.5 mg total) by mouth at bedtime. 30 tablet 0   No current facility-administered medications for this visit.    Allergies  Allergen Reactions   Dilaudid [Hydromorphone Hcl] Rash and Other (See Comments)    Shakey   Hydromorphone Hcl Other (See Comments) and Rash    Shakey   Metoclopramide Other (See Comments), Rash, Swelling and Hives    Swelling in lips   Metoclopramide Hcl Hives, Swelling, Rash and Other (See Comments)    Swelling in lips   Nsaids     H/o bleeding gastric ulcers   Benadryl [Diphenhydramine]     Causes patient to become hyper and pace    Family History  Problem Relation Age of Onset   Diabetes Mother    Liver disease Mother    Diabetes Father    Heart disease Father    Healthy Sister    Healthy Brother    Healthy Brother    Colon cancer Neg Hx    Stomach cancer Neg Hx    Rectal cancer Neg Hx    Esophageal cancer Neg Hx    Colon polyps Neg Hx    Prostate cancer Neg Hx     Social History   Socioeconomic History   Marital status: Single    Spouse name: Not on file    Number of children: 1   Years of education: 12   Highest education level: 12th grade  Occupational History   Occupation: Maintenance - [employer]  Tobacco Use   Smoking status: Never   Smokeless tobacco:  Current    Types: Chew   Tobacco comments:    occ-Chew    None on 07/15/21  Vaping Use   Vaping status: Never Used  Substance and Sexual Activity   Alcohol use: Yes    Alcohol/week: 0.0 standard drinks of alcohol    Comment: 09/04/11 "very seldom"   Drug use: Never   Sexual activity: Yes  Other Topics Concern   Not on file  Social History Narrative   Lives with brother in a one story home.  Has one daughter.  Works in Production designer, theatre/television/film for [employer].  Education: high school.       Patient is left-handed.   Social Determinants of Health   Financial Resource Strain: Low Risk  (05/01/2022)   Overall Financial Resource Strain (CARDIA)    Difficulty of Paying Living Expenses: Not very hard  Food Insecurity: Food Insecurity Present (05/01/2022)   Hunger Vital Sign    Worried About Running Out of Food in the Last Year: Sometimes true    Ran Out of Food in the Last Year: Sometimes true  Transportation Needs: No Transportation Needs (05/01/2022)   PRAPARE - Administrator, Civil Service (Medical): No    Lack of Transportation (Non-Medical): No  Physical Activity: Sufficiently Active (05/01/2022)   Exercise Vital Sign    Days of Exercise per Week: 4 days    Minutes of Exercise per Session: 60 min  Stress: No Stress Concern Present (05/01/2022)   [employer] of Occupational Health - Occupational Stress Questionnaire    Feeling of Stress : Only a little  Social Connections: Moderately Isolated (05/01/2022)   Social Connection and Isolation Panel [NHANES]    Frequency of Communication with Friends and Family: More than three times a week    Frequency of Social Gatherings with Friends and Family: Once a week    Attends Religious Services: 1 to 4 times per year    Active  Member of [employer] or Organizations: No    Attends Engineer, structural: Not on file    Marital Status: Divorced  Intimate Partner Violence: Not on file     Constitutional: Patient reports intermittent headaches.  Denies fever, malaise, fatigue, or abrupt weight changes.  HEENT: Denies eye pain, eye redness, ear pain, ringing in the ears, wax buildup, runny nose, nasal congestion, bloody nose, or sore throat. Respiratory: Denies difficulty breathing, shortness of breath, cough or sputum production.   Cardiovascular: Denies chest pain, chest tightness, palpitations or swelling in the hands or feet.  Gastrointestinal: Denies abdominal pain, bloating, constipation, diarrhea or blood in the stool.  GU: Denies urgency, frequency, pain with urination, burning sensation, blood in urine, odor or discharge. Musculoskeletal: Denies decrease in range of motion, difficulty with gait, muscle pain or joint pain and swelling.  Skin: Denies redness, rashes, lesions or ulcercations.  Neurological: Patient reports insomnia, paresthesia of upper extremities.  Denies dizziness, difficulty with memory, difficulty with speech or problems with balance and coordination.  Psych: Patient has a history of anxiety.  Denies depression, SI/HI.  No other specific complaints in a complete review of systems (except as listed in HPI above).  Objective:   Physical Exam  BP 132/80 (BP Location: Left Arm, Patient Position: Sitting, Cuff Size: Normal)   Pulse 89   Temp (!) 95.7 F (35.4 C) (Temporal)   Wt 209 lb (94.8 kg)   SpO2 98%   BMI 30.86 kg/m   Wt Readings from Last 3 Encounters:  05/15/22 201 lb (91.2  kg)  05/06/22 201 lb (91.2 kg)  05/01/22 198 lb (89.8 kg)    General: Appears his stated age, obese, in NAD. Skin: Warm, dry and intact. No ulcerations noted. HEENT: Head: normal shape and size; Eyes: sclera white, no icterus, conjunctiva pink, PERRLA and EOMs intact;  Cardiovascular: Normal rate and  rhythm. S1,S2 noted.  No murmur, rubs or gallops noted. No JVD or BLE edema. No carotid bruits noted. Pulmonary/Chest: Normal effort and positive vesicular breath sounds. No respiratory distress. No wheezes, rales or ronchi noted.  Abdomen: Soft and nontender. Normal bowel sounds.  Musculoskeletal: No difficulty with gait.  Neurological: Alert and oriented. Coordination normal.  Psychiatric: Mood and affect normal. Behavior is normal. Judgment and thought content normal.    BMET    Component Value Date/Time   NA 139 05/01/2022 0952   NA 142 04/26/2020 0849   K 5.0 05/01/2022 0952   CL 103 05/01/2022 0952   CO2 25 05/01/2022 0952   GLUCOSE 92 05/01/2022 0952   BUN 24 05/01/2022 0952   BUN 18 04/26/2020 0849   CREATININE 1.00 05/01/2022 0952   CALCIUM 10.5 (H) 05/01/2022 0952   GFRNONAA >60 04/11/2020 0320   GFRAA 96 12/12/2019 1134    Lipid Panel     Component Value Date/Time   CHOL 153 05/01/2022 0952   CHOL 112 11/28/2020 0746   TRIG 163 (H) 05/01/2022 0952   HDL 77 05/01/2022 0952   HDL 60 11/28/2020 0746   CHOLHDL 2.0 05/01/2022 0952   VLDL 32 04/10/2020 0402   LDLCALC 52 05/01/2022 0952    CBC    Component Value Date/Time   WBC 9.2 05/06/2022 1601   RBC 4.58 05/06/2022 1601   HGB 14.2 05/06/2022 1601   HCT 42.1 05/06/2022 1601   PLT 339 05/06/2022 1601   MCV 91.9 05/06/2022 1601   MCH 31.0 05/06/2022 1601   MCHC 33.7 05/06/2022 1601   RDW 12.5 05/06/2022 1601   LYMPHSABS 2,842.8 05/06/2022 1601   MONOABS 1.0 04/09/2020 1553   EOSABS 147 05/06/2022 1601   BASOSABS 37 05/06/2022 1601    Hgb A1C Lab Results  Component Value Date   HGBA1C 5.9 (H) 05/01/2022           Assessment & Plan:     RTC in 6 months for your annual exam Nicki Reaper, NP

## 2022-10-15 NOTE — Assessment & Plan Note (Signed)
Continue Ambien. 

## 2022-10-15 NOTE — Assessment & Plan Note (Signed)
Advised him Topamax does not work on an as-needed basis

## 2022-10-15 NOTE — Assessment & Plan Note (Signed)
Continue clonazepam as needed Support offered 

## 2022-10-15 NOTE — Assessment & Plan Note (Signed)
Continue cyclobenzaprine and gabapentin as prescribed He will continue to follow with neurology

## 2022-10-16 ENCOUNTER — Other Ambulatory Visit (HOSPITAL_COMMUNITY): Payer: Self-pay

## 2022-10-16 LAB — LIPID PANEL
Cholesterol: 153 mg/dL (ref <200)
HDL: 68 mg/dL (ref >=40)
LDL Cholesterol (Calc): 67 mg/dL
Non-HDL Cholesterol (Calc): 85 mg/dL (ref <130)
Total CHOL/HDL Ratio: 2.3 (calc) (ref <5.0)
Triglycerides: 97 mg/dL (ref <150)

## 2022-10-16 LAB — COMPLETE METABOLIC PANEL WITH GFR
AG Ratio: 1.9 (calc) (ref 1.0–2.5)
ALT: 42 U/L (ref 9–46)
AST: 27 U/L (ref 10–35)
Albumin: 4.5 g/dL (ref 3.6–5.1)
Alkaline phosphatase (APISO): 58 U/L (ref 35–144)
BUN: 20 mg/dL (ref 7–25)
CO2: 24 mmol/L (ref 20–32)
Calcium: 9.5 mg/dL (ref 8.6–10.3)
Chloride: 105 mmol/L (ref 98–110)
Creat: 1.05 mg/dL (ref 0.70–1.30)
Globulin: 2.4 g/dL (ref 1.9–3.7)
Glucose, Bld: 106 mg/dL — ABNORMAL HIGH (ref 65–99)
Potassium: 5 mmol/L (ref 3.5–5.3)
Sodium: 138 mmol/L (ref 135–146)
Total Bilirubin: 0.5 mg/dL (ref 0.2–1.2)
Total Protein: 6.9 g/dL (ref 6.1–8.1)
eGFR: 85 mL/min/{1.73_m2} (ref >=60)

## 2022-10-16 LAB — CBC
HCT: 44.9 % (ref 38.5–50.0)
Hemoglobin: 15.2 g/dL (ref 13.2–17.1)
MCH: 31.1 pg (ref 27.0–33.0)
MCHC: 33.9 g/dL (ref 32.0–36.0)
MCV: 92 fL (ref 80.0–100.0)
MPV: 10.4 fL (ref 7.5–12.5)
Platelets: 334 10*3/uL (ref 140–400)
RBC: 4.88 10*6/uL (ref 4.20–5.80)
RDW: 12.2 % (ref 11.0–15.0)
WBC: 8.3 10*3/uL (ref 3.8–10.8)

## 2022-10-16 LAB — MICROALBUMIN / CREATININE URINE RATIO
Creatinine, Urine: 101 mg/dL (ref 20–320)
Microalb Creat Ratio: 11 mg/g{creat} (ref <30)
Microalb, Ur: 1.1 mg/dL

## 2022-10-16 MED ORDER — GABAPENTIN 300 MG PO CAPS
300.0000 mg | ORAL_CAPSULE | Freq: Three times a day (TID) | ORAL | 5 refills | Status: DC
Start: 2022-10-16 — End: 2023-03-08
  Filled 2022-10-16: qty 90, 30d supply, fill #0
  Filled 2022-11-23: qty 90, 30d supply, fill #1
  Filled 2022-12-22: qty 90, 30d supply, fill #2
  Filled 2023-01-22: qty 90, 30d supply, fill #3
  Filled 2023-02-22: qty 90, 30d supply, fill #4

## 2022-10-29 ENCOUNTER — Other Ambulatory Visit (HOSPITAL_COMMUNITY): Payer: Self-pay

## 2022-10-29 ENCOUNTER — Other Ambulatory Visit: Payer: Self-pay

## 2022-10-29 ENCOUNTER — Other Ambulatory Visit: Payer: Self-pay | Admitting: Internal Medicine

## 2022-10-29 MED ORDER — AMLODIPINE BESYLATE 10 MG PO TABS
10.0000 mg | ORAL_TABLET | Freq: Every day | ORAL | 0 refills | Status: DC
  Filled 2022-10-29 – 2022-11-27 (×2): qty 90, 90d supply, fill #0

## 2022-10-29 NOTE — Telephone Encounter (Signed)
Requested Prescriptions  Pending Prescriptions Disp Refills   amLODipine (NORVASC) 10 MG tablet 90 tablet 0    Sig: Take 1 tablet (10 mg total) by mouth daily. Please call our to schedule an overdue appointment with Dr. Shari Prows before anymore refills. [phone]. Thank you. 3rd attempt.     Cardiovascular: Calcium Channel Blockers 2 Passed - 10/29/2022 10:16 AM      Passed - Last BP in normal range    BP Readings from Last 1 Encounters:  10/15/22 132/80         Passed - Last Heart Rate in normal range    Pulse Readings from Last 1 Encounters:  10/15/22 89         Passed - Valid encounter within last 6 months    Recent Outpatient Visits           2 weeks ago Type 2 diabetes mellitus without complication, without long-term current use of insulin [facility])   [place] [facility] [place], Salvadore Oxford, NP   5 months ago Primary hypertension   [place] [facility] [place], Salvadore Oxford, NP   5 months ago Dyspnea on exertion   [place] [place], Netta Neat, DO   6 months ago Encounter for general adult medical examination with abnormal findings   [place] [facility] [place], [place] W, NP   1 year ago Type 2 diabetes mellitus without complication, without long-term current use of insulin [facility])   [place] [facility] [place], Salvadore Oxford, NP       Future Appointments             In 6 months [place], Salvadore Oxford, NP [place] [facility]             clonazePAM (KLONOPIN) 0.5 MG tablet 30 tablet 0    Sig: Take 1 tablet (0.5 mg total) by mouth at bedtime.     Not Delegated - Psychiatry: Anxiolytics/Hypnotics 2 Failed - 10/29/2022 10:16 AM      Failed - This refill cannot be delegated      Failed - Urine Drug Screen completed in last 360 days      Passed - Patient is not pregnant      Passed - Valid encounter within last 6 months    Recent Outpatient Visits            2 weeks ago Type 2 diabetes mellitus without complication, without long-term current use of insulin [facility])   [place] [facility], Salvadore Oxford, NP   5 months ago Primary hypertension   [place] [facility] [place], Salvadore Oxford, NP   5 months ago Dyspnea on exertion   [place] [facility] [place], Netta Neat, DO   6 months ago Encounter for general adult medical examination with abnormal findings   [place] [facility] [place], [place] W, NP   1 year ago Type 2 diabetes mellitus without complication, without long-term current use of insulin [facility])   [place] [facility] [place], Salvadore Oxford, NP       Future Appointments             In 6 months Ba[place], Salvadore Oxford, NP  [facility]             zolpidem (AMBIEN CR) 12.5 MG CR tablet 30 tablet 0  Sig: Take 1 tablet (12.5 mg total) by mouth at bedtime.     Not Delegated - Psychiatry:  Anxiolytics/Hypnotics Failed - 10/29/2022 10:16 AM      Failed - This refill cannot be delegated      Failed - Urine Drug Screen completed in last 360 days      Passed - Valid encounter within last 6 months    Recent Outpatient Visits           2 weeks ago Type 2 diabetes mellitus without complication, without long-term current use of insulin [facility])   [place] [facility] [place], Salvadore Oxford, NP   5 months ago Primary hypertension   [place] [facility] [place], Salvadore Oxford, NP   5 months ago Dyspnea on exertion   [place] [facility] [place], Netta Neat, DO   6 months ago Encounter for general adult medical examination with abnormal findings   [place] [facility] [place], [place], NP   1 year ago Type 2 diabetes mellitus without complication, without long-term current use of insulin [facility])   [place] [facility] [place], Salvadore Oxford, NP        Future Appointments             In 6 months Baity, Salvadore Oxford, NP [place] [facility]

## 2022-10-29 NOTE — Telephone Encounter (Signed)
Requested medication (s) are due for refill today: routing for approval  Requested medication (s) are on the active medication list: yes  Last refill:  09/30/22  Future visit scheduled: yes  Notes to clinic:  Unable to refill per protocol, cannot delegate.      Requested Prescriptions  Pending Prescriptions Disp Refills   clonazePAM (KLONOPIN) 0.5 MG tablet 30 tablet 0    Sig: Take 1 tablet (0.5 mg total) by mouth at bedtime.     Not Delegated - Psychiatry: Anxiolytics/Hypnotics 2 Failed - 10/29/2022 10:16 AM      Failed - This refill cannot be delegated      Failed - Urine Drug Screen completed in last 360 days      Passed - Patient is not pregnant      Passed - Valid encounter within last 6 months    Recent Outpatient Visits           2 weeks ago Type 2 diabetes mellitus without complication, without long-term current use of insulin [facility])   [place] [facility] [place], Salvadore Oxford, NP   5 months ago Primary hypertension   [place] [facility], Salvadore Oxford, NP   5 months ago Dyspnea on exertion   [place] [place], Netta Neat, DO   6 months ago Encounter for general adult medical examination with abnormal findings   [place] [facility] [place], [place] W, NP   1 year ago Type 2 diabetes mellitus without complication, without long-term current use of insulin [facility])   [place] [facility] [place], Salvadore Oxford, NP       Future Appointments             In 6 months Baity, Salvadore Oxford, NP [place] [facility], [facility]             zolpidem (AMBIEN CR) 12.5 MG CR tablet 30 tablet 0    Sig: Take 1 tablet (12.5 mg total) by mouth at bedtime.     Not Delegated - Psychiatry:  Anxiolytics/Hypnotics Failed - 10/29/2022 10:16 AM      Failed - This refill cannot be delegated      Failed - Urine Drug Screen completed in last 360 days      Passed - Valid  encounter within last 6 months    Recent Outpatient Visits           2 weeks ago Type 2 diabetes mellitus without complication, without long-term current use of insulin [facility])   [place] [facility] [place], Salvadore Oxford, NP   5 months ago Primary hypertension   [employer] [facility] [place], Salvadore Oxford, NP   5 months ago Dyspnea on exertion   [place] [facility] [place], Netta Neat, DO   6 months ago Encounter for general adult medical examination with abnormal findings   [place] [place], [place] W, NP   1 year ago Type 2 diabetes mellitus without complication, without long-term current use of insulin [facility])   [place] [place], Salvadore Oxford, NP       Future Appointments             In 6 months Baity, Salvadore Oxford, NP  [facility] [place]            Signed Prescriptions Disp Refills  amLODipine (NORVASC) 10 MG tablet 90 tablet 0    Sig: Take 1 tablet (10 mg total) by mouth daily. Please call our to schedule an overdue appointment with Dr. Shari Prows before anymore refills. [phone]. Thank you. 3rd attempt.     Cardiovascular: Calcium Channel Blockers 2 Passed - 10/29/2022 10:16 AM      Passed - Last BP in normal range    BP Readings from Last 1 Encounters:  10/15/22 132/80         Passed - Last Heart Rate in normal range    Pulse Readings from Last 1 Encounters:  10/15/22 89         Passed - Valid encounter within last 6 months    Recent Outpatient Visits           2 weeks ago Type 2 diabetes mellitus without complication, without long-term current use of insulin [facility])   [place] [place], Salvadore Oxford, NP   5 months ago Primary hypertension   [place] [facility] [place], Salvadore Oxford, NP   5 months ago Dyspnea on exertion   [facility] [facility], Netta Neat, DO   6  months ago Encounter for general adult medical examination with abnormal findings   [place] [facility] [place], [place] W, NP   1 year ago Type 2 diabetes mellitus without complication, without long-term current use of insulin [facility])   [place] [facility] [place], Salvadore Oxford, NP       Future Appointments             In 6 months Baity, Salvadore Oxford, NP [facility], [facility]

## 2022-10-30 ENCOUNTER — Other Ambulatory Visit (HOSPITAL_COMMUNITY): Payer: Self-pay

## 2022-10-30 MED ORDER — CLONAZEPAM 0.5 MG PO TABS
0.5000 mg | ORAL_TABLET | Freq: Every day | ORAL | 0 refills | Status: DC
Start: 2022-10-30 — End: 2022-11-27
  Filled 2022-10-30: qty 30, 30d supply, fill #0

## 2022-10-30 MED ORDER — ZOLPIDEM TARTRATE ER 12.5 MG PO TBCR
12.5000 mg | EXTENDED_RELEASE_TABLET | Freq: Every day | ORAL | 0 refills | Status: DC
Start: 2022-10-30 — End: 2022-11-27
  Filled 2022-10-30: qty 30, 30d supply, fill #0

## 2022-11-02 ENCOUNTER — Other Ambulatory Visit: Payer: Self-pay | Admitting: Internal Medicine

## 2022-11-02 DIAGNOSIS — K219 Gastro-esophageal reflux disease without esophagitis: Secondary | ICD-10-CM

## 2022-11-03 ENCOUNTER — Other Ambulatory Visit (HOSPITAL_COMMUNITY): Payer: Self-pay

## 2022-11-03 MED ORDER — CYCLOBENZAPRINE HCL 10 MG PO TABS
10.0000 mg | ORAL_TABLET | Freq: Two times a day (BID) | ORAL | 0 refills | Status: DC | PRN
  Filled 2022-11-03: qty 60, 30d supply, fill #0

## 2022-11-03 MED ORDER — PANTOPRAZOLE SODIUM 40 MG PO TBEC
40.0000 mg | DELAYED_RELEASE_TABLET | Freq: Two times a day (BID) | ORAL | 1 refills | Status: DC
Start: 2022-11-03 — End: 2023-04-21
  Filled 2022-11-03 – 2022-11-11 (×7): qty 180, 90d supply, fill #0
  Filled 2023-02-05: qty 180, 90d supply, fill #1

## 2022-11-03 MED ORDER — LISINOPRIL 10 MG PO TABS
10.0000 mg | ORAL_TABLET | Freq: Every day | ORAL | 1 refills | Status: DC
  Filled 2022-11-03: qty 90, 90d supply, fill #0
  Filled 2023-02-01: qty 90, 90d supply, fill #1

## 2022-11-03 MED ORDER — ONDANSETRON HCL 4 MG PO TABS
4.0000 mg | ORAL_TABLET | Freq: Three times a day (TID) | ORAL | 1 refills | Status: DC | PRN
  Filled 2022-11-03: qty 20, 7d supply, fill #0
  Filled 2023-04-15: qty 20, 7d supply, fill #1

## 2022-11-03 NOTE — Telephone Encounter (Signed)
Requested Prescriptions  Pending Prescriptions Disp Refills   ondansetron (ZOFRAN) 4 MG tablet 20 tablet 1    Sig: Take 1 tablet (4 mg total) by mouth every 8 (eight) hours as needed for nausea or vomiting.     Not Delegated - Gastroenterology: Antiemetics - ondansetron Failed - 11/02/2022 10:20 AM      Failed - This refill cannot be delegated      Passed - AST in normal range and within 360 days    AST  Date Value Ref Range Status  10/15/2022 27 10 - 35 U/L Final         Passed - ALT in normal range and within 360 days    ALT  Date Value Ref Range Status  10/15/2022 42 9 - 46 U/L Final         Passed - Valid encounter within last 6 months    Recent Outpatient Visits           2 weeks ago Type 2 diabetes mellitus without complication, without long-term current use of insulin [facility])   [place] [facility] [place], Salvadore Oxford, NP   5 months ago Primary hypertension   [place] [facility] [place], Salvadore Oxford, NP   6 months ago Dyspnea on exertion   [employer] [facility] [place], Netta Neat, DO   6 months ago Encounter for general adult medical examination with abnormal findings   [place] [facility] [place], [place] W, NP   1 year ago Type 2 diabetes mellitus without complication, without long-term current use of insulin [facility])   [place] [facility] [place], Salvadore Oxford, NP       Future Appointments             In 6 months Baity, Salvadore Oxford, NP [place] [facility]             pantoprazole (PROTONIX) 40 MG tablet 180 tablet 1    Sig: Take 1 tablet (40 mg total) by mouth 2 (two) times daily.     Gastroenterology: Proton Pump Inhibitors Passed - 11/02/2022 10:20 AM      Passed - Valid encounter within last 12 months    Recent Outpatient Visits           2 weeks ago Type 2 diabetes mellitus without complication, without long-term current use of insulin [facility])    [place] [facility] [place], Salvadore Oxford, NP   5 months ago Primary hypertension   [place] [facility] [place], Salvadore Oxford, NP   6 months ago Dyspnea on exertion   [place] [facility] Smitty Cords, DO   6 months ago Encounter for general adult medical examination with abnormal findings   [facility] [place], [place], NP   1 year ago Type 2 diabetes mellitus without complication, without long-term current use of insulin [facility])   [place] [facility] [place], Salvadore Oxford, NP       Future Appointments             In 6 months Baity, Salvadore Oxford, NP [place] [facility], [facility]             lisinopril (ZESTRIL) 10 MG tablet 90 tablet 1    Sig: Take 1 tablet (10 mg total) by mouth daily. Please call our to schedule an  overdue appointment with Dr. Shari Prows before anymore refills. [phone]. Thank you. 3rd and final attempt.     Cardiovascular:  ACE Inhibitors Passed - 11/02/2022 10:20 AM      Passed - Cr in normal range and within 180 days    Creat  Date Value Ref Range Status  10/15/2022 1.05 0.70 - 1.30 mg/dL Final   Creatinine, Urine  Date Value Ref Range Status  10/15/2022 101 20 - 320 mg/dL Final         Passed - K in normal range and within 180 days    Potassium  Date Value Ref Range Status  10/15/2022 5.0 3.5 - 5.3 mmol/L Final         Passed - Patient is not pregnant      Passed - Last BP in normal range    BP Readings from Last 1 Encounters:  10/15/22 132/80         Passed - Valid encounter within last 6 months    Recent Outpatient Visits           2 weeks ago Type 2 diabetes mellitus without complication, without long-term current use of insulin [facility])   [place] [facility] [place], Salvadore Oxford, NP   5 months ago Primary hypertension   [place] [place], Salvadore Oxford, NP   6 months ago Dyspnea on  exertion   [place] [place], Netta Neat, DO   6 months ago Encounter for general adult medical examination with abnormal findings   [place] [facility] [place], [place] W, NP   1 year ago Type 2 diabetes mellitus without complication, without long-term current use of insulin [facility])   [place] [facility] [place], Salvadore Oxford, NP       Future Appointments             In 6 months Baity, Salvadore Oxford, NP [facility], [facility]             cyclobenzaprine (FLEXERIL) 10 MG tablet 60 tablet 0    Sig: Take 1 tablet (10 mg total) by mouth 2 (two) times daily as needed.     Not Delegated - Analgesics:  Muscle Relaxants Failed - 11/02/2022 10:20 AM      Failed - This refill cannot be delegated      Passed - Valid encounter within last 6 months    Recent Outpatient Visits           2 weeks ago Type 2 diabetes mellitus without complication, without long-term current use of insulin [facility])   [place] [facility] [place], Salvadore Oxford, NP   5 months ago Primary hypertension   [place] [facility] [place], Salvadore Oxford, NP   6 months ago Dyspnea on exertion   [place] [facility] [place], Netta Neat, DO   6 months ago Encounter for general adult medical examination with abnormal findings   [place] [facility] [place], [place] W, NP   1 year ago Type 2 diabetes mellitus without complication, without long-term current use of insulin [facility])   [place] [facility] [place], Salvadore Oxford, NP       Future Appointments             In 6 months Baity, Salvadore Oxford, NP [place] [facility], [facility]

## 2022-11-03 NOTE — Telephone Encounter (Signed)
Requested medication (s) are due for refill today: yes  Requested medication (s) are on the active medication list: yes  Last refill:  05/28/22 and 10/02/22  Future visit scheduled: yes  Notes to clinic:  Unable to refill per protocol, cannot delegate.      Requested Prescriptions  Pending Prescriptions Disp Refills   ondansetron (ZOFRAN) 4 MG tablet 20 tablet 1    Sig: Take 1 tablet (4 mg total) by mouth every 8 (eight) hours as needed for nausea or vomiting.     Not Delegated - Gastroenterology: Antiemetics - ondansetron Failed - 11/02/2022 10:20 AM      Failed - This refill cannot be delegated      Passed - AST in normal range and within 360 days    AST  Date Value Ref Range Status  10/15/2022 27 10 - 35 U/L Final         Passed - ALT in normal range and within 360 days    ALT  Date Value Ref Range Status  10/15/2022 42 9 - 46 U/L Final         Passed - Valid encounter within last 6 months    Recent Outpatient Visits           2 weeks ago Type 2 diabetes mellitus without complication, without long-term current use of insulin [facility])   [facility] [place], Salvadore Oxford, NP   5 months ago Primary hypertension   [place] [facility] [place], Salvadore Oxford, NP   6 months ago Dyspnea on exertion   [place] [facility] [place], Netta Neat, DO   6 months ago Encounter for general adult medical examination with abnormal findings   [place] [facility] [place], [place] W, NP   1 year ago Type 2 diabetes mellitus without complication, without long-term current use of insulin [facility])   [place] [facility] [place], Salvadore Oxford, NP       Future Appointments             In 6 months Baity, Salvadore Oxford, NP [facility] [facility], [facility]             cyclobenzaprine (FLEXERIL) 10 MG tablet 60 tablet 0    Sig: Take 1 tablet (10 mg total) by mouth 2 (two) times daily as needed.      Not Delegated - Analgesics:  Muscle Relaxants Failed - 11/02/2022 10:20 AM      Failed - This refill cannot be delegated      Passed - Valid encounter within last 6 months    Recent Outpatient Visits           2 weeks ago Type 2 diabetes mellitus without complication, without long-term current use of insulin [facility])   [place] [facility] [place], Salvadore Oxford, NP   5 months ago Primary hypertension   [place] [facility], Salvadore Oxford, NP   6 months ago Dyspnea on exertion   [place] [facility] [place], Netta Neat, DO   6 months ago Encounter for general adult medical examination with abnormal findings   [place] [facility] [place], [place] W, NP   1 year ago Type 2 diabetes mellitus without complication, without long-term current use of insulin [facility])    [facility] [place], Salvadore Oxford, NP       Future  Appointments             In 6 months Baity, Salvadore Oxford, NP [facility], [place]            Signed Prescriptions Disp Refills   pantoprazole (PROTONIX) 40 MG tablet 180 tablet 1    Sig: Take 1 tablet (40 mg total) by mouth 2 (two) times daily.     Gastroenterology: Proton Pump Inhibitors Passed - 11/02/2022 10:20 AM      Passed - Valid encounter within last 12 months    Recent Outpatient Visits           2 weeks ago Type 2 diabetes mellitus without complication, without long-term current use of insulin [facility])   [place] [facility] [place], Salvadore Oxford, NP   5 months ago Primary hypertension   [place] [facility] [place], Salvadore Oxford, NP   6 months ago Dyspnea on exertion   [place] [facility] Smitty Cords, DO   6 months ago Encounter for general adult medical examination with abnormal findings   [place] [place], [place], NP   1 year ago Type 2 diabetes mellitus  without complication, without long-term current use of insulin [facility])   [place] [facility] [place], Salvadore Oxford, NP       Future Appointments             In 6 months Baity, Salvadore Oxford, NP [place] [facility], [facility]             lisinopril (ZESTRIL) 10 MG tablet 90 tablet 1    Sig: Take 1 tablet (10 mg total) by mouth daily. Please call our to schedule an overdue appointment with Dr. Shari Prows before anymore refills. [phone]. Thank you. 3rd and final attempt.     Cardiovascular:  ACE Inhibitors Passed - 11/02/2022 10:20 AM      Passed - Cr in normal range and within 180 days    Creat  Date Value Ref Range Status  10/15/2022 1.05 0.70 - 1.30 mg/dL Final   Creatinine, Urine  Date Value Ref Range Status  10/15/2022 101 20 - 320 mg/dL Final         Passed - K in normal range and within 180 days    Potassium  Date Value Ref Range Status  10/15/2022 5.0 3.5 - 5.3 mmol/L Final         Passed - Patient is not pregnant      Passed - Last BP in normal range    BP Readings from Last 1 Encounters:  10/15/22 132/80         Passed - Valid encounter within last 6 months    Recent Outpatient Visits           2 weeks ago Type 2 diabetes mellitus without complication, without long-term current use of insulin [facility])   [facility] [place], Salvadore Oxford, NP   5 months ago Primary hypertension   [place] [facility] [place], Salvadore Oxford, NP   6 months ago Dyspnea on exertion   [place] [facility] [place], Netta Neat, DO   6 months ago Encounter for general adult medical examination with abnormal findings   [place] [facility] [place], [place] W, NP   1 year ago Type 2 diabetes mellitus without complication, without long-term current use  of insulin [facility])   [place] [facility] [place], Salvadore Oxford, NP       Future Appointments             In 6 months  Baity, Salvadore Oxford, NP [facility], [place]

## 2022-11-05 ENCOUNTER — Other Ambulatory Visit (HOSPITAL_COMMUNITY): Payer: Self-pay

## 2022-11-06 ENCOUNTER — Other Ambulatory Visit (HOSPITAL_COMMUNITY): Payer: Self-pay

## 2022-11-09 ENCOUNTER — Other Ambulatory Visit: Payer: Self-pay

## 2022-11-11 ENCOUNTER — Other Ambulatory Visit (HOSPITAL_COMMUNITY): Payer: Self-pay

## 2022-11-24 ENCOUNTER — Other Ambulatory Visit (HOSPITAL_COMMUNITY): Payer: Self-pay

## 2022-11-27 ENCOUNTER — Other Ambulatory Visit: Payer: Self-pay | Admitting: Internal Medicine

## 2022-11-27 ENCOUNTER — Other Ambulatory Visit (HOSPITAL_COMMUNITY): Payer: Self-pay

## 2022-11-27 ENCOUNTER — Other Ambulatory Visit: Payer: Self-pay

## 2022-11-27 NOTE — Telephone Encounter (Signed)
Requested medication (s) are due for refill today: Yes  Requested medication (s) are on the active medication list: Yes  Last refill:  10/30/22 #30 0RF for both medications  Future visit scheduled: Yes  Notes to clinic:  Unable to refill per protocol, cannot delegate.      Requested Prescriptions  Pending Prescriptions Disp Refills   clonazePAM (KLONOPIN) 0.5 MG tablet 30 tablet 0    Sig: Take 1 tablet (0.5 mg total) by mouth at bedtime.     Not Delegated - Psychiatry: Anxiolytics/Hypnotics 2 Failed - 11/27/2022  8:41 AM      Failed - This refill cannot be delegated      Failed - Urine Drug Screen completed in last 360 days      Passed - Patient is not pregnant      Passed - Valid encounter within last 6 months    Recent Outpatient Visits           1 month ago Type 2 diabetes mellitus without complication, without long-term current use of insulin [facility])   [place] [facility] [place], Salvadore Oxford, NP   6 months ago Primary hypertension   [place] [facility] [place], Salvadore Oxford, NP   6 months ago Dyspnea on exertion   [place] [facility] [place], Netta Neat, DO   7 months ago Encounter for general adult medical examination with abnormal findings   [place] [facility] [place], [place] W, NP   1 year ago Type 2 diabetes mellitus without complication, without long-term current use of insulin [facility])   [place] [facility] [place], Salvadore Oxford, NP       Future Appointments             In 5 months [place], Salvadore Oxford, NP [place] [facility], [facility]             zolpidem (AMBIEN CR) 12.5 MG CR tablet 30 tablet 0    Sig: Take 1 tablet (12.5 mg total) by mouth at bedtime.     Not Delegated - Psychiatry:  Anxiolytics/Hypnotics Failed - 11/27/2022  8:41 AM      Failed - This refill cannot be delegated      Failed - Urine Drug Screen completed in last 360 days      Passed -  Valid encounter within last 6 months    Recent Outpatient Visits           1 month ago Type 2 diabetes mellitus without complication, without long-term current use of insulin [facility])   [place] [facility] [place], Salvadore Oxford, NP   6 months ago Primary hypertension   [place] [facility] [place], Salvadore Oxford, NP   6 months ago Dyspnea on exertion   [place] [facility] [place], Netta Neat, DO   7 months ago Encounter for general adult medical examination with abnormal findings   [place] [facility] [place], [place] W, NP   1 year ago Type 2 diabetes mellitus without complication, without long-term current use of insulin [facility])   [place] [facility] [place], Salvadore Oxford, NP       Future Appointments             In 5 months Baity, Salvadore Oxford, NP [place] [facility], [facility]

## 2022-11-30 ENCOUNTER — Other Ambulatory Visit (HOSPITAL_COMMUNITY): Payer: Self-pay

## 2022-11-30 MED ORDER — CLONAZEPAM 0.5 MG PO TABS
0.5000 mg | ORAL_TABLET | Freq: Every day | ORAL | 0 refills | Status: DC
Start: 2022-11-30 — End: 2022-12-28
  Filled 2022-11-30: qty 30, 30d supply, fill #0

## 2022-11-30 MED ORDER — ZOLPIDEM TARTRATE ER 12.5 MG PO TBCR
12.5000 mg | EXTENDED_RELEASE_TABLET | Freq: Every day | ORAL | 0 refills | Status: DC
Start: 2022-11-30 — End: 2022-12-28
  Filled 2022-11-30: qty 30, 30d supply, fill #0

## 2022-12-04 ENCOUNTER — Other Ambulatory Visit (HOSPITAL_COMMUNITY): Payer: Self-pay

## 2022-12-04 ENCOUNTER — Other Ambulatory Visit: Payer: Self-pay | Admitting: Internal Medicine

## 2022-12-04 MED ORDER — CYCLOBENZAPRINE HCL 10 MG PO TABS
10.0000 mg | ORAL_TABLET | Freq: Two times a day (BID) | ORAL | 0 refills | Status: DC | PRN
  Filled 2022-12-04: qty 60, 30d supply, fill #0

## 2022-12-04 NOTE — Telephone Encounter (Signed)
Requested medication (s) are due for refill today: yes  Requested medication (s) are on the active medication list: yes  Last refill:  11/03/22 #60/0  Future visit scheduled: yes  Notes to clinic:  Unable to refill per protocol, cannot delegate.    Requested Prescriptions  Pending Prescriptions Disp Refills   cyclobenzaprine (FLEXERIL) 10 MG tablet 60 tablet 0    Sig: Take 1 tablet (10 mg total) by mouth 2 (two) times daily as needed.     Not Delegated - Analgesics:  Muscle Relaxants Failed - 12/04/2022 11:29 AM      Failed - This refill cannot be delegated      Passed - Valid encounter within last 6 months    Recent Outpatient Visits           1 month ago Type 2 diabetes mellitus without complication, without long-term current use of insulin [facility])   [place] [facility] [place], Salvadore Oxford, NP   6 months ago Primary hypertension   [place] [facility] [place], Salvadore Oxford, NP   7 months ago Dyspnea on exertion   [place] [facility] [place], Netta Neat, DO   7 months ago Encounter for general adult medical examination with abnormal findings   [place] [facility] [place], [place] W, NP   1 year ago Type 2 diabetes mellitus without complication, without long-term current use of insulin [facility])   [place] [facility] [place], Salvadore Oxford, NP       Future Appointments             In 5 months Baity, Salvadore Oxford, NP [place] [facility], [facility]

## 2022-12-28 ENCOUNTER — Other Ambulatory Visit: Payer: Self-pay | Admitting: Internal Medicine

## 2022-12-28 ENCOUNTER — Other Ambulatory Visit: Payer: Self-pay

## 2022-12-29 ENCOUNTER — Other Ambulatory Visit (HOSPITAL_COMMUNITY): Payer: Self-pay

## 2022-12-29 MED ORDER — CLONAZEPAM 0.5 MG PO TABS
0.5000 mg | ORAL_TABLET | Freq: Every day | ORAL | 0 refills | Status: DC
Start: 2022-12-29 — End: 2023-01-28
  Filled 2022-12-29 – 2022-12-30 (×2): qty 30, 30d supply, fill #0

## 2022-12-29 MED ORDER — ZOLPIDEM TARTRATE ER 12.5 MG PO TBCR
12.5000 mg | EXTENDED_RELEASE_TABLET | Freq: Every day | ORAL | 0 refills | Status: DC
  Filled 2022-12-29 – 2022-12-30 (×2): qty 30, 30d supply, fill #0

## 2022-12-29 NOTE — Telephone Encounter (Signed)
Requested medication (s) are due for refill today:   Provider to review  Requested medication (s) are on the active medication list:   Yes for both  Future visit scheduled:   Yes 05/03/2023   Last ordered: Both 11/30/2022 #30, 0 refills each.    Non delegated refills    Requested Prescriptions  Pending Prescriptions Disp Refills   clonazePAM (KLONOPIN) 0.5 MG tablet 30 tablet 0    Sig: Take 1 tablet (0.5 mg total) by mouth at bedtime.     Not Delegated - Psychiatry: Anxiolytics/Hypnotics 2 Failed - 12/28/2022 12:08 PM      Failed - This refill cannot be delegated      Failed - Urine Drug Screen completed in last 360 days      Passed - Patient is not pregnant      Passed - Valid encounter within last 6 months    Recent Outpatient Visits           2 months ago Type 2 diabetes mellitus without complication, without long-term current use of insulin [facility])   [facility] [place], Salvadore Oxford, NP   7 months ago Primary hypertension   [facility] [place], Salvadore Oxford, NP   7 months ago Dyspnea on exertion   [place] [facility] [place], Netta Neat, DO   8 months ago Encounter for general adult medical examination with abnormal findings   [facility] [place], [place] W, NP   1 year ago Type 2 diabetes mellitus without complication, without long-term current use of insulin [facility])   [place] [place], Salvadore Oxford, NP       Future Appointments             In 4 months [place], Salvadore Oxford, NP [place] [facility], [facility]             zolpidem (AMBIEN CR) 12.5 MG CR tablet 30 tablet 0    Sig: Take 1 tablet (12.5 mg total) by mouth at bedtime.     Not Delegated - Psychiatry:  Anxiolytics/Hypnotics Failed - 12/28/2022 12:08 PM      Failed - This refill cannot be delegated      Failed - Urine Drug Screen completed in last 360 days      Passed -  Valid encounter within last 6 months    Recent Outpatient Visits           2 months ago Type 2 diabetes mellitus without complication, without long-term current use of insulin [facility])   [place] [facility] [place], Salvadore Oxford, NP   7 months ago Primary hypertension   [place] [place], Salvadore Oxford, NP   7 months ago Dyspnea on exertion   [place] [facility] [place], Netta Neat, DO   8 months ago Encounter for general adult medical examination with abnormal findings   [place] [facility] [place], [place] W, NP   1 year ago Type 2 diabetes mellitus without complication, without long-term current use of insulin [facility])   [place] [facility] [place], Salvadore Oxford, NP       Future Appointments             In 4 months Baity, Salvadore Oxford, NP  [place] [facility], [facility]

## 2022-12-30 ENCOUNTER — Other Ambulatory Visit (HOSPITAL_COMMUNITY): Payer: Self-pay

## 2023-01-11 ENCOUNTER — Other Ambulatory Visit: Payer: Self-pay | Admitting: Internal Medicine

## 2023-01-12 ENCOUNTER — Other Ambulatory Visit (HOSPITAL_COMMUNITY): Payer: Self-pay

## 2023-01-12 ENCOUNTER — Other Ambulatory Visit: Payer: Self-pay

## 2023-01-12 MED ORDER — CYCLOBENZAPRINE HCL 10 MG PO TABS
10.0000 mg | ORAL_TABLET | Freq: Two times a day (BID) | ORAL | 0 refills | Status: DC | PRN
  Filled 2023-01-12: qty 60, 30d supply, fill #0

## 2023-01-12 MED ORDER — CARVEDILOL 3.125 MG PO TABS
3.1250 mg | ORAL_TABLET | Freq: Two times a day (BID) | ORAL | 1 refills | Status: DC
  Filled 2023-01-12: qty 180, 90d supply, fill #0
  Filled 2023-04-12: qty 180, 90d supply, fill #1

## 2023-01-12 NOTE — Telephone Encounter (Signed)
Requested Prescriptions  Pending Prescriptions Disp Refills   carvedilol (COREG) 3.125 MG tablet 180 tablet 1    Sig: Take 1 tablet (3.125 mg total) by mouth 2 (two) times daily with a meal.     Cardiovascular: Beta Blockers 3 Passed - 01/11/2023  8:08 AM      Passed - Cr in normal range and within 360 days    Creat  Date Value Ref Range Status  10/15/2022 1.05 0.70 - 1.30 mg/dL Final   Creatinine, Urine  Date Value Ref Range Status  10/15/2022 101 20 - 320 mg/dL Final         Passed - AST in normal range and within 360 days    AST  Date Value Ref Range Status  10/15/2022 27 10 - 35 U/L Final         Passed - ALT in normal range and within 360 days    ALT  Date Value Ref Range Status  10/15/2022 42 9 - 46 U/L Final         Passed - Last BP in normal range    BP Readings from Last 1 Encounters:  10/15/22 132/80         Passed - Last Heart Rate in normal range    Pulse Readings from Last 1 Encounters:  10/15/22 89         Passed - Valid encounter within last 6 months    Recent Outpatient Visits           2 months ago Type 2 diabetes mellitus without complication, without long-term current use of insulin (HCC)   [place] [place], Salvadore Oxford, NP   8 months ago Primary hypertension   [place] [facility] [place], Salvadore Oxford, NP   8 months ago Dyspnea on exertion   [place] [facility] [place], Netta Neat, DO   8 months ago Encounter for general adult medical examination with abnormal findings   [place] [facility] [place], [place] W, NP   1 year ago Type 2 diabetes mellitus without complication, without long-term current use of insulin [facility])   [place] [facility] [place], Salvadore Oxford, NP       Future Appointments             In 3 months Baity, Salvadore Oxford, NP [place] [facility], [facility]             cyclobenzaprine (FLEXERIL) 10 MG tablet 60 tablet  0    Sig: Take 1 tablet (10 mg total) by mouth 2 (two) times daily as needed.     Not Delegated - Analgesics:  Muscle Relaxants Failed - 01/11/2023  8:08 AM      Failed - This refill cannot be delegated      Passed - Valid encounter within last 6 months    Recent Outpatient Visits           2 months ago Type 2 diabetes mellitus without complication, without long-term current use of insulin [facility])   [place] [facility] [address], Salvadore Oxford, NP   8 months ago Primary hypertension   [place] [facility] [place], Salvadore Oxford, NP   8 months ago Dyspnea on exertion    [facility] [place], Netta Neat, DO   8 months ago Encounter for general adult medical examination with abnormal findings   [facility] [place], [place] W, NP   1 year ago Type 2 diabetes mellitus without complication, without long-term current use of insulin [facility])   [place] [facility] [place], Salvadore Oxford, NP       Future Appointments             In 3 months Baity, Salvadore Oxford, NP [place], [facility]

## 2023-01-12 NOTE — Telephone Encounter (Signed)
Requested medications are due for refill today.  yes  Requested medications are on the active medications list.  yes  Last refill. 12/04/2022 #60 0 rf  Future visit scheduled.   yes  Notes to clinic.  Refill not delegated.    Requested Prescriptions  Pending Prescriptions Disp Refills   cyclobenzaprine (FLEXERIL) 10 MG tablet 60 tablet 0    Sig: Take 1 tablet (10 mg total) by mouth 2 (two) times daily as needed.     Not Delegated - Analgesics:  Muscle Relaxants Failed - 01/11/2023  8:08 AM      Failed - This refill cannot be delegated      Passed - Valid encounter within last 6 months    Recent Outpatient Visits           2 months ago Type 2 diabetes mellitus without complication, without long-term current use of insulin [facility])   [place] [facility] [place], Salvadore Oxford, NP   8 months ago Primary hypertension   [place] [place], Salvadore Oxford, NP   8 months ago Dyspnea on exertion   [place] [facility] S[place]ty Cords, DO   8 months ago Encounter for general adult medical examination with abnormal findings   [place] [place], Salvadore Oxford, NP   1 year ago Type 2 diabetes mellitus without complication, without long-term current use of insulin (HCC)   [place] [facility] [place], Salvadore Oxford, NP       Future Appointments             In 3 months Baity, Salvadore Oxford, NP [place] [facility], [facility]            Signed Prescriptions Disp Refills   carvedilol (COREG) 3.125 MG tablet 180 tablet 1    Sig: Take 1 tablet (3.125 mg total) by mouth 2 (two) times daily with a meal.     Cardiovascular: Beta Blockers 3 Passed - 01/11/2023  8:08 AM      Passed - Cr in normal range and within 360 days    Creat  Date Value Ref Range Status  10/15/2022 1.05 0.70 - 1.30 mg/dL Final   Creatinine, Urine  Date Value Ref Range Status  10/15/2022 101 20 - 320 mg/dL  Final         Passed - AST in normal range and within 360 days    AST  Date Value Ref Range Status  10/15/2022 27 10 - 35 U/L Final         Passed - ALT in normal range and within 360 days    ALT  Date Value Ref Range Status  10/15/2022 42 9 - 46 U/L Final         Passed - Last BP in normal range    BP Readings from Last 1 Encounters:  10/15/22 132/80         Passed - Last Heart Rate in normal range    Pulse Readings from Last 1 Encounters:  10/15/22 89         Passed - Valid encounter within last 6 months    Recent Outpatient Visits           2 months ago Type 2 diabetes mellitus without complication, without long-term current use of insulin [facility])   [place] [facility] [place], Salvadore Oxford, NP   8 months ago Primary hypertension  [facility] [place], Salvadore Oxford, NP   8 months ago Dyspnea on exertion   [place] [facility] [place], Netta Neat, DO   8 months ago Encounter for general adult medical examination with abnormal findings   [facility] [place], [place], NP   1 year ago Type 2 diabetes mellitus without complication, without long-term current use of insulin [facility])   [place] [facility] [place], Salvadore Oxford, NP       Future Appointments             In 3 months Baity, Salvadore Oxford, NP  [facility], [facility]

## 2023-01-18 NOTE — Telephone Encounter (Signed)
 Care team updated and letter sent for eye exam notes.

## 2023-01-28 ENCOUNTER — Other Ambulatory Visit: Payer: Self-pay

## 2023-01-28 ENCOUNTER — Other Ambulatory Visit: Payer: Self-pay | Admitting: Internal Medicine

## 2023-01-29 ENCOUNTER — Other Ambulatory Visit (HOSPITAL_COMMUNITY): Payer: Self-pay

## 2023-01-29 MED ORDER — ALBUTEROL SULFATE HFA 108 (90 BASE) MCG/ACT IN AERS
1.0000 | INHALATION_SPRAY | RESPIRATORY_TRACT | 0 refills | Status: DC | PRN
  Filled 2023-01-29: qty 6.7, 17d supply, fill #0

## 2023-01-29 NOTE — Telephone Encounter (Signed)
Requested Prescriptions  Pending Prescriptions Disp Refills   albuterol (VENTOLIN HFA) 108 (90 Base) MCG/ACT inhaler 6.7 g 0    Sig: Inhale 1-2 puffs into the lungs every 4 (four) hours as needed for wheezing or shortness of breath     Pulmonology:  Beta Agonists 2 Passed - 01/29/2023  2:50 PM      Passed - Last BP in normal range    BP Readings from Last 1 Encounters:  10/15/22 132/80         Passed - Last Heart Rate in normal range    Pulse Readings from Last 1 Encounters:  10/15/22 89         Passed - Valid encounter within last 12 months    Recent Outpatient Visits           3 months ago Type 2 diabetes mellitus without complication, without long-term current use of insulin [facility])   [place] [facility] [place], Salvadore Oxford, NP   8 months ago Primary hypertension   [place] [facility] [place], Salvadore Oxford, NP   8 months ago Dyspnea on exertion   [place] [facility] [place], Netta Neat, DO   9 months ago Encounter for general adult medical examination with abnormal findings   [place] [facility] [place], [place] W, NP   1 year ago Type 2 diabetes mellitus without complication, without long-term current use of insulin [facility])   [place] [facility] [place], Salvadore Oxford, NP       Future Appointments             In 3 months [place], Salvadore Oxford, NP [place] [facility] [place], [facility]             clonazePAM (KLONOPIN) 0.5 MG tablet 30 tablet 0    Sig: Take 1 tablet (0.5 mg total) by mouth at bedtime.     Not Delegated - Psychiatry: Anxiolytics/Hypnotics 2 Failed - 01/29/2023  2:50 PM      Failed - This refill cannot be delegated      Failed - Urine Drug Screen completed in last 360 days      Passed - Patient is not pregnant      Passed - Valid encounter within last 6 months    Recent Outpatient Visits           3 months ago Type 2 diabetes mellitus without complication,  without long-term current use of insulin [facility])   [place] [facility] [place], Salvadore Oxford, NP   8 months ago Primary hypertension   [place] [facility] [place], Salvadore Oxford, NP   8 months ago Dyspnea on exertion   [place] [facility] [place], Netta Neat, DO   9 months ago Encounter for general adult medical examination with abnormal findings   [place] [facility] [place], [place] W, NP   1 year ago Type 2 diabetes mellitus without complication, without long-term current use of insulin [facility])   [place] [facility] [place], Salvadore Oxford, NP       Future Appointments             In 3 months Baity, Salvadore Oxford, NP [place] [facility], [facility]             zolpidem (AMBIEN CR) 12.5 MG CR tablet 30 tablet 0    Sig: Take  1 tablet (12.5 mg total) by mouth at bedtime.     Not Delegated - Psychiatry:  Anxiolytics/Hypnotics Failed - 01/29/2023  2:50 PM      Failed - This refill cannot be delegated      Failed - Urine Drug Screen completed in last 360 days      Passed - Valid encounter within last 6 months    Recent Outpatient Visits           3 months ago Type 2 diabetes mellitus without complication, without long-term current use of insulin [facility])   [place] [facility] [place], Salvadore Oxford, NP   8 months ago Primary hypertension   [place] [facility] [place], Salvadore Oxford, NP   8 months ago Dyspnea on exertion   [place] [facility] [place], Netta Neat, DO   9 months ago Encounter for general adult medical examination with abnormal findings   [place] [facility] [place], [place] W, NP   1 year ago Type 2 diabetes mellitus without complication, without long-term current use of insulin [facility])   [place] [facility] [place], Salvadore Oxford, NP       Future Appointments             In 3 months Baity,  Salvadore Oxford, NP [facility], [facility]

## 2023-01-29 NOTE — Telephone Encounter (Signed)
Requested medication (s) are due for refill today: Yes  Requested medication (s) are on the active medication list: Yes  Last refill:  12/29/22  Future visit scheduled:   Notes to clinic:  Not delegated.    Requested Prescriptions  Pending Prescriptions Disp Refills   clonazePAM (KLONOPIN) 0.5 MG tablet 30 tablet 0    Sig: Take 1 tablet (0.5 mg total) by mouth at bedtime.     Not Delegated - Psychiatry: Anxiolytics/Hypnotics 2 Failed - 01/29/2023  2:50 PM      Failed - This refill cannot be delegated      Failed - Urine Drug Screen completed in last 360 days      Passed - Patient is not pregnant      Passed - Valid encounter within last 6 months    Recent Outpatient Visits           3 months ago Type 2 diabetes mellitus without complication, without long-term current use of insulin [facility])   [place] [facility] [place], Salvadore Oxford, NP   8 months ago Primary hypertension   [place] [facility] [place], Salvadore Oxford, NP   8 months ago Dyspnea on exertion   [place] [facility] [place], Netta Neat, DO   9 months ago Encounter for general adult medical examination with abnormal findings   [place] [facility] [place], [place] W, NP   1 year ago Type 2 diabetes mellitus without complication, without long-term current use of insulin [place] [facility])   [place] [facility] [place], Salvadore Oxford, NP       Future Appointments             In 3 months Baity, Salvadore Oxford, NP [place] [facility], [facility]             zolpidem (AMBIEN CR) 12.5 MG CR tablet 30 tablet 0    Sig: Take 1 tablet (12.5 mg total) by mouth at bedtime.     Not Delegated - Psychiatry:  Anxiolytics/Hypnotics Failed - 01/29/2023  2:50 PM      Failed - This refill cannot be delegated      Failed - Urine Drug Screen completed in last 360 days      Passed - Valid encounter within last 6 months    Recent Outpatient Visits            3 months ago Type 2 diabetes mellitus without complication, without long-term current use of insulin [facility])   [place] [facility] [place], Salvadore Oxford, NP   8 months ago Primary hypertension   [place] [facility] [place], Salvadore Oxford, NP   8 months ago Dyspnea on exertion   [place] [facility] [place], Netta Neat, DO   9 months ago Encounter for general adult medical examination with abnormal findings   [place] [facility], [place] W, NP   1 year ago Type 2 diabetes mellitus without complication, without long-term current use of insulin [facility])   [place] [facility] [place], Salvadore Oxford, NP       Future Appointments             In 3 months Baity, Salvadore Oxford, NP  [facility], [facility]            Signed Prescriptions Disp Refills   albuterol (VENTOLIN HFA) 108 (90 Base)  MCG/ACT inhaler 6.7 g 0    Sig: Inhale 1-2 puffs into the lungs every 4 (four) hours as needed for wheezing or shortness of breath     Pulmonology:  Beta Agonists 2 Passed - 01/29/2023  2:50 PM      Passed - Last BP in normal range    BP Readings from Last 1 Encounters:  10/15/22 132/80         Passed - Last Heart Rate in normal range    Pulse Readings from Last 1 Encounters:  10/15/22 89         Passed - Valid encounter within last 12 months    Recent Outpatient Visits           3 months ago Type 2 diabetes mellitus without complication, without long-term current use of insulin [facility])   [place] [facility] [place], Salvadore Oxford, NP   8 months ago Primary hypertension   [place] [facility] [place], Salvadore Oxford, NP   8 months ago Dyspnea on exertion   [place] [facility] [place], Netta Neat, DO   9 months ago Encounter for general adult medical examination with abnormal findings   [facility] [place],  [place] W, NP   1 year ago Type 2 diabetes mellitus without complication, without long-term current use of insulin [facility])   [place] [facility] [place], Salvadore Oxford, NP       Future Appointments             In 3 months Baity, Salvadore Oxford, NP [place] [facility], [facility]

## 2023-02-01 ENCOUNTER — Other Ambulatory Visit (HOSPITAL_COMMUNITY): Payer: Self-pay

## 2023-02-01 ENCOUNTER — Other Ambulatory Visit: Payer: Self-pay

## 2023-02-01 MED ORDER — CLONAZEPAM 0.5 MG PO TABS
0.5000 mg | ORAL_TABLET | Freq: Every day | ORAL | 0 refills | Status: DC
  Filled 2023-02-01: qty 30, 30d supply, fill #0

## 2023-02-01 MED ORDER — ZOLPIDEM TARTRATE ER 12.5 MG PO TBCR
12.5000 mg | EXTENDED_RELEASE_TABLET | Freq: Every day | ORAL | 0 refills | Status: DC
  Filled 2023-02-01 (×2): qty 30, 30d supply, fill #0

## 2023-02-05 ENCOUNTER — Other Ambulatory Visit (HOSPITAL_COMMUNITY): Payer: Self-pay

## 2023-02-09 ENCOUNTER — Other Ambulatory Visit (HOSPITAL_COMMUNITY): Payer: Self-pay

## 2023-02-09 ENCOUNTER — Other Ambulatory Visit: Payer: Self-pay | Admitting: Internal Medicine

## 2023-02-09 MED ORDER — CYCLOBENZAPRINE HCL 10 MG PO TABS
10.0000 mg | ORAL_TABLET | Freq: Two times a day (BID) | ORAL | 0 refills | Status: DC | PRN
  Filled 2023-02-09: qty 60, 30d supply, fill #0

## 2023-02-09 NOTE — Telephone Encounter (Signed)
 Requested medication (s) are due for refill today - yes  Requested medication (s) are on the active medication list -yes  Future visit scheduled -yes  Last refill: 01/12/23 #60  Notes to clinic: non delegated Rx  Requested Prescriptions  Pending Prescriptions Disp Refills   cyclobenzaprine  (FLEXERIL ) 10 MG tablet 60 tablet 0    Sig: Take 1 tablet (10 mg total) by mouth 2 (two) times daily as needed.     Not Delegated - Analgesics:  Muscle Relaxants Failed - 02/09/2023 12:19 PM      Failed - This refill cannot be delegated      Passed - Valid encounter within last 6 months    Recent Outpatient Visits           3 months ago Type 2 diabetes mellitus without complication, without long-term current use of insulin [facility])   [place] [facility] [place], Angeline ORN, NP   9 months ago Primary hypertension   [place] [facility] [place], Angeline ORN, NP   9 months ago Dyspnea on exertion   [place] [facility] [place], Marsa PARAS, DO   9 months ago Encounter for general adult medical examination with abnormal findings   [place] [facility] [place], [place] W, NP   1 year ago Type 2 diabetes mellitus without complication, without long-term current use of insulin [facility])   [place] [facility] [place], Angeline ORN, NP       Future Appointments             In 2 months Alexander, Angeline ORN, NP [facility], [facility]               Requested Prescriptions  Pending Prescriptions Disp Refills   cyclobenzaprine  (FLEXERIL ) 10 MG tablet 60 tablet 0    Sig: Take 1 tablet (10 mg total) by mouth 2 (two) times daily as needed.     Not Delegated - Analgesics:  Muscle Relaxants Failed - 02/09/2023 12:19 PM      Failed - This refill cannot be delegated      Passed - Valid encounter within last 6 months    Recent Outpatient Visits           3 months ago Type 2 diabetes mellitus without  complication, without long-term current use of insulin [facility])   [place] [place], Angeline ORN, NP   9 months ago Primary hypertension   [place] [facility] [place], Angeline ORN, NP   9 months ago Dyspnea on exertion   [place] [place], Marsa PARAS, DO   9 months ago Encounter for general adult medical examination with abnormal findings   [place] [facility] [place], [place] W, NP   1 year ago Type 2 diabetes mellitus without complication, without long-term current use of insulin [facility])   [place] [facility] [place], Angeline ORN, NP       Future Appointments             In 2 months Baity, Angeline ORN, NP  [facility], [facility]

## 2023-03-03 ENCOUNTER — Other Ambulatory Visit: Payer: Self-pay | Admitting: Neurology

## 2023-03-03 ENCOUNTER — Other Ambulatory Visit: Payer: Self-pay | Admitting: Internal Medicine

## 2023-03-04 ENCOUNTER — Other Ambulatory Visit (HOSPITAL_COMMUNITY): Payer: Self-pay

## 2023-03-04 MED ORDER — AMLODIPINE BESYLATE 10 MG PO TABS
10.0000 mg | ORAL_TABLET | Freq: Every day | ORAL | 0 refills | Status: DC
  Filled 2023-03-04: qty 90, 90d supply, fill #0

## 2023-03-04 NOTE — Telephone Encounter (Signed)
Requested medication (s) are due for refill today: Yes  Requested medication (s) are on the active medication list: Yes  Last refill:  02/01/23  Future visit scheduled:   Notes to clinic:  Not delegated.    Requested Prescriptions  Pending Prescriptions Disp Refills   clonazePAM (KLONOPIN) 0.5 MG tablet 30 tablet 0    Sig: Take 1 tablet (0.5 mg total) by mouth at bedtime.     Not Delegated - Psychiatry: Anxiolytics/Hypnotics 2 Failed - 03/04/2023  3:09 PM      Failed - This refill cannot be delegated      Failed - Urine Drug Screen completed in last 360 days      Passed - Patient is not pregnant      Passed - Valid encounter within last 6 months    Recent Outpatient Visits           4 months ago Type 2 diabetes mellitus without complication, without long-term current use of insulin [facility])   [place] [facility] [place], Salvadore Oxford, NP   9 months ago Primary hypertension   [place] [facility] [place], Salvadore Oxford, NP   10 months ago Dyspnea on exertion   [place] [facility] [place], Netta Neat, DO   10 months ago Encounter for general adult medical examination with abnormal findings   [place] [facility] [place], [place] W, NP   1 year ago Type 2 diabetes mellitus without complication, without long-term current use of insulin [facility])   [place] [facility] [place], Salvadore Oxford, NP       Future Appointments             In 2 months [place], Salvadore Oxford, NP [place] [facility]             zolpidem (AMBIEN CR) 12.5 MG CR tablet 30 tablet 0    Sig: Take 1 tablet (12.5 mg total) by mouth at bedtime.     Not Delegated - Psychiatry:  Anxiolytics/Hypnotics Failed - 03/04/2023  3:09 PM      Failed - This refill cannot be delegated      Failed - Urine Drug Screen completed in last 360 days      Passed - Valid encounter within last 6 months    Recent Outpatient Visits            4 months ago Type 2 diabetes mellitus without complication, without long-term current use of insulin [facility])   [place] [facility] [place], Salvadore Oxford, NP   9 months ago Primary hypertension   [place] [facility] [place], Salvadore Oxford, NP   10 months ago Dyspnea on exertion   [place] [facility] [place], Netta Neat, DO   10 months ago Encounter for general adult medical examination with abnormal findings   [place] [facility] [place], [place] W, NP   1 year ago Type 2 diabetes mellitus without complication, without long-term current use of insulin [facility])   [place] [facility] [place], Salvadore Oxford, NP       Future Appointments             In 2 months Baity, Salvadore Oxford, NP [place] [facility], [facility]            Signed Prescriptions Disp Refills   amLODipine (NORVASC) 10 MG tablet 90  tablet 0    Sig: Take 1 tablet (10 mg total) by mouth daily. Please call our to schedule an overdue appointment with Dr. Shari Prows before anymore refills. [phone]. Thank you. 3rd attempt.     Cardiovascular: Calcium Channel Blockers 2 Passed - 03/04/2023  3:09 PM      Passed - Last BP in normal range    BP Readings from Last 1 Encounters:  10/15/22 132/80         Passed - Last Heart Rate in normal range    Pulse Readings from Last 1 Encounters:  10/15/22 89         Passed - Valid encounter within last 6 months    Recent Outpatient Visits           4 months ago Type 2 diabetes mellitus without complication, without long-term current use of insulin [facility])   [place] [facility] [place], Salvadore Oxford, NP   9 months ago Primary hypertension   [place] [facility] [place], Salvadore Oxford, NP   10 months ago Dyspnea on exertion   [place] [facility] [place], Netta Neat, DO   10 months ago Encounter for general adult medical examination  with abnormal findings   [place] [facility] [place], [place] W, NP   1 year ago Type 2 diabetes mellitus without complication, without long-term current use of insulin [facility])   [place] [facility] Strong, Salvadore Oxford, NP       Future Appointments             In 2 months Baity, Salvadore Oxford, NP [place] [facility], [facility]

## 2023-03-04 NOTE — Telephone Encounter (Signed)
Requested Prescriptions  Pending Prescriptions Disp Refills   amLODipine (NORVASC) 10 MG tablet 90 tablet 0    Sig: Take 1 tablet (10 mg total) by mouth daily. Please call our to schedule an overdue appointment with Dr. Shari Prows before anymore refills. [phone]. Thank you. 3rd attempt.     Cardiovascular: Calcium Channel Blockers 2 Passed - 03/04/2023  3:08 PM      Passed - Last BP in normal range    BP Readings from Last 1 Encounters:  10/15/22 132/80         Passed - Last Heart Rate in normal range    Pulse Readings from Last 1 Encounters:  10/15/22 89         Passed - Valid encounter within last 6 months    Recent Outpatient Visits           4 months ago Type 2 diabetes mellitus without complication, without long-term current use of insulin [facility])   [place] [facility] [place], Salvadore Oxford, NP   9 months ago Primary hypertension   [place] [facility] [place], Salvadore Oxford, NP   10 months ago Dyspnea on exertion   [facility] [place], Netta Neat, DO   10 months ago Encounter for general adult medical examination with abnormal findings   [facility] [place], [place] W, NP   1 year ago Type 2 diabetes mellitus without complication, without long-term current use of insulin [facility])   [place] [facility] [place], Salvadore Oxford, NP       Future Appointments             In 2 months [place], Salvadore Oxford, NP [place] [facility]             clonazePAM (KLONOPIN) 0.5 MG tablet 30 tablet 0    Sig: Take 1 tablet (0.5 mg total) by mouth at bedtime.     Not Delegated - Psychiatry: Anxiolytics/Hypnotics 2 Failed - 03/04/2023  3:08 PM      Failed - This refill cannot be delegated      Failed - Urine Drug Screen completed in last 360 days      Passed - Patient is not pregnant      Passed - Valid encounter within last 6 months    Recent Outpatient Visits            4 months ago Type 2 diabetes mellitus without complication, without long-term current use of insulin [facility])   [place] [facility] [place], Salvadore Oxford, NP   9 months ago Primary hypertension   [place] [facility] [place], Salvadore Oxford, NP   10 months ago Dyspnea on exertion   [place] [facility] [place], Netta Neat, DO   10 months ago Encounter for general adult medical examination with abnormal findings   [place] [place], [place] W, NP   1 year ago Type 2 diabetes mellitus without complication, without long-term current use of insulin [facility])   [place] [facility] [place], Salvadore Oxford, NP       Future Appointments             In 2 months Baity, Salvadore Oxford, NP [place] [facility]             zolpidem (AMBIEN CR) 12.5 MG CR tablet 30  tablet 0    Sig: Take 1 tablet (12.5 mg total) by mouth at bedtime.     Not Delegated - Psychiatry:  Anxiolytics/Hypnotics Failed - 03/04/2023  3:08 PM      Failed - This refill cannot be delegated      Failed - Urine Drug Screen completed in last 360 days      Passed - Valid encounter within last 6 months    Recent Outpatient Visits           4 months ago Type 2 diabetes mellitus without complication, without long-term current use of insulin [facility])   [place] [facility] [place], Salvadore Oxford, NP   9 months ago Primary hypertension   [place] [facility] [place], Salvadore Oxford, NP   10 months ago Dyspnea on exertion   [place] [facility] [place], Netta Neat, DO   10 months ago Encounter for general adult medical examination with abnormal findings   [place] [facility] [place], [place] W, NP   1 year ago Type 2 diabetes mellitus without complication, without long-term current use of insulin [facility])   [place] [address] [facility] [place], Salvadore Oxford, NP        Future Appointments             In 2 months Baity, Salvadore Oxford, NP [place] [facility], [facility]

## 2023-03-05 ENCOUNTER — Other Ambulatory Visit (HOSPITAL_COMMUNITY): Payer: Self-pay

## 2023-03-05 ENCOUNTER — Other Ambulatory Visit: Payer: Self-pay

## 2023-03-05 MED ORDER — CLONAZEPAM 0.5 MG PO TABS
0.5000 mg | ORAL_TABLET | Freq: Every day | ORAL | 0 refills | Status: DC
  Filled 2023-03-05: qty 30, 30d supply, fill #0

## 2023-03-05 MED ORDER — ZOLPIDEM TARTRATE ER 12.5 MG PO TBCR
12.5000 mg | EXTENDED_RELEASE_TABLET | Freq: Every day | ORAL | 0 refills | Status: DC
  Filled 2023-03-05: qty 30, 30d supply, fill #0

## 2023-03-05 MED ORDER — TOPIRAMATE ER 50 MG PO CAP24
50.0000 mg | ORAL_CAPSULE | Freq: Every day | ORAL | 5 refills | Status: DC
  Filled 2023-03-05: qty 30, 30d supply, fill #0

## 2023-03-05 NOTE — Progress Notes (Unsigned)
NEUROLOGY FOLLOW UP OFFICE NOTE  [name] [id number]  Assessment/Plan:     1.  Headache 2.  Upper extremity paresthesias - may be secondary to Trokendi  3.  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  Discontinue Trokendi to see if paresthesias resolve 2.  Increase gabapentin to 600mg  three times daily 3.  Follow up 6 months.   Subjective:  [name] is a 53 year old man with unstable angina and history of stroke who follows up regarding worsening upper extremity numbness and tingling.     UPDATE: Due to ongoing upper extremity pain and numbness despite carpal tunnel surgery, MRI of C-spine with and without contrast was performed on 04/16/2022, which was personally reviewed and revealed post surgical changes of C3-7 fusion with patent central canal, mild to moderate foraminal narrowing at C6-7 (more notable on left) and small focus of chronic myelomalacia in the right hemicord at approximately C5-6.  Still has extreme paresthesias in the fingers.  Difficult to grip.  Gabapentin helps.  On Trokendi XR.  Headaches are well controlled.  CMP in September unremarkable.  Hgb A1c was 5.9.  HISTORY: Cervical spinal stenosis: In 2020, he was found to have severe cervical spinal stenosis, presenting with numbness, tingling and aching/soreness of arms and legs as well as paresthesias and tightness sensation involves the left side of arm and torso and a little bit in the leg.  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 He was referred to neurosurgery and underwent C3-C7 ACDF on 04/08/18. Residual pain has been maintained on gabapentin.  In 2019, he began experiencing pain, numbness and tingling in the hands.  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  He underwent bilateral carpal tunnel release a couple of years ago.   However, he has not had any improvement.  Repeat NCV-EMG on 09/04/2021 iewed demonstrated improvement with improvement of median neuropathy on the left and no evidence of carpal tunnel syndrome on the right.  Interval development of chronic mild right C5-6 radiculopathy now noted.  Mild left ulnar neuropathy across elbow stable.    Headache: Since his stroke in September 2019, he continued to have a constant severe right-sided pressure-like headache involving the right temple and right side of face associated with right sided facial numbness and tingling.  He also noted worsening slurred speech, right upper extremity numbness and weakness.  He developed new blurred peripheral vision in his right eye.  He also endorsed postural lightheadedness when standing but not vertigo.  He presented to the ED on 11/06/17 for further evaluation.  Labs were unremarkable.  CT of head demonstrated no acute findings.  MRI of brain was normal with resolution of prior punctate focus.  He was treated with a migraine cocktail and discharged.  Past medication:  Nortriptyline (helpful but discontinued due to possibly contributing to elevated blood pressure)    PAST MEDICAL HISTORY: Past Medical History:  Diagnosis Date   Anxiety    takes Xanax daily prn, panic attacks   Chronic gastric ulcer with bleeding s/p suture repair 2012 09/29/2010   Head 3 EGD which failed to stop the bleeding. Had exploratory laparotomy on 10/04/2010. Possible cause of the gastric ulcer was NSAID use.  Clotting disorder (HCC)    Constipation    related to pain meds   Depression    Diabetes mellitus without complication (HCC)    Dizziness    occasionally   Dyspnea    walking a bit   Erosive esophagitis    External hemorrhoid, bleeding    Gastric ulcer    history of   GERD  (gastroesophageal reflux disease)    takes Protonix bid   Hiatal hernia    History of blood clots 2012   in abdomen   History of blood transfusion 2012   Hyperlipidemia    Hypertension    Incisional hernia s/p open repair w mesh Aug 2013 05/26/2011   Joint pain    knees   Nocturia    depends on amount of fluid he drinks   Pneumonia 2018   Pre-diabetes    Primary localized osteoarthritis of left hip 04/24/2014   Serrated polyp of colon    Sleep apnea    no cpap  mild no cpap needed   Stroke [facility])    Unstable angina (HCC) 04/09/2020   Upper GI bleed    Ventral hernia     MEDICATIONS: Current Outpatient Medications on File Prior to Visit  Medication Sig Dispense Refill   albuterol (VENTOLIN HFA) 108 (90 Base) MCG/ACT inhaler Inhale 1-2 puffs into the lungs every 4 (four) hours as needed for wheezing or shortness of breath 6.7 g 0   amLODipine (NORVASC) 10 MG tablet Take 1 tablet (10 mg total) by mouth daily. Please call our to schedule an overdue appointment with Dr. Shari Prows before anymore refills. [phone]. Thank you. 3rd attempt. 90 tablet 0   aspirin EC 81 MG EC tablet Take 1 tablet (81 mg total) by mouth daily.     carvedilol (COREG) 3.125 MG tablet Take 1 tablet (3.125 mg total) by mouth 2 (two) times daily with a meal. 180 tablet 1   clonazePAM (KLONOPIN) 0.5 MG tablet Take 1 tablet (0.5 mg total) by mouth at bedtime. 30 tablet 0   cyclobenzaprine (FLEXERIL) 10 MG tablet Take 1 tablet (10 mg total) by mouth 2 (two) times daily as needed. 60 tablet 0   fluticasone (FLONASE) 50 MCG/ACT nasal spray Place 2 sprays into both nostrils daily as needed for allergies. 16 g 11   gabapentin (NEURONTIN) 300 MG capsule Take 1 capsule (300 mg total) by mouth 3 (three) times daily. 90 capsule 5   lisinopril (ZESTRIL) 10 MG tablet Take 1 tablet (10 mg total) by mouth daily. Please call our to schedule an overdue appointment with Dr. Shari Prows before anymore refills. [phone]. Thank  you. 3rd and final attempt. 90 tablet 1   ondansetron (ZOFRAN) 4 MG tablet Take 1 tablet (4 mg total) by mouth every 8 (eight) hours as needed for nausea or vomiting. 20 tablet 1   pantoprazole (PROTONIX) 40 MG tablet Take 1 tablet (40 mg total) by mouth 2 (two) times daily. 180 tablet 1   rosuvastatin (CRESTOR) 5 MG tablet Take 1 tablet (5 mg total) by mouth daily. 90 tablet 3   Topiramate ER (TROKENDI XR) 50 MG CP24 Take 1 capsule (50 mg) by mouth at bedtime. 30 capsule 5   zolpidem (AMBIEN CR) 12.5 MG CR tablet Take 1 tablet (12.5 mg total) by mouth at bedtime. 30 tablet 0   [DISCONTINUED] nortriptyline (PAMELOR) 25 MG capsule TAKE 1 CAPSULE BY MOUTH AT BEDTIME 30 capsule 0   [DISCONTINUED] simvastatin (ZOCOR) 10 MG tablet Take 1 tablet (10  mg total) by mouth daily. 90 tablet 2   No current facility-administered medications on file prior to visit.    ALLERGIES: Allergies  Allergen Reactions   Dilaudid [Hydromorphone Hcl] Rash and Other (See Comments)    Shakey   Hydromorphone Hcl Other (See Comments) and Rash    Shakey   Metoclopramide Other (See Comments), Rash, Swelling and Hives    Swelling in lips   Metoclopramide Hcl Hives, Swelling, Rash and Other (See Comments)    Swelling in lips   Nsaids     H/o bleeding gastric ulcers   Benadryl [Diphenhydramine]     Causes patient to become hyper and pace    FAMILY HISTORY: Family History  Problem Relation Age of Onset   Diabetes Mother    Liver disease Mother    Diabetes Father    Heart disease Father    Healthy Sister    Healthy Brother    Healthy Brother    Colon cancer Neg Hx    Stomach cancer Neg Hx    Rectal cancer Neg Hx    Esophageal cancer Neg Hx    Colon polyps Neg Hx    Prostate cancer Neg Hx       Objective:  Blood pressure 121/68, pulse 76, height 5\' 7"  (1.702 m), weight 209 lb (94.8 kg). General: No acute distress.  Patient appears well-groomed.   Head:  Normocephalic/atraumatic Eyes:  Fundi examined but  not visualized Neck: supple, no paraspinal tenderness, full range of motion Heart:  Regular rate and rhythm Neurological Exam: alert and oriented.  Speech fluent and not dysarthric, language intact.  CN II-XII intact. Bulk and tone normal, muscle strength 5/5 throughout.  Sensation to light touch intact.  Deep tendon reflexes 2+ throughout.  Finger to nose testing intact.  Gait normal, Romberg negative.   Bruce Millet, DO  CC: Bruce Reaper, NP

## 2023-03-08 ENCOUNTER — Encounter: Payer: Self-pay | Admitting: Neurology

## 2023-03-08 ENCOUNTER — Other Ambulatory Visit (HOSPITAL_COMMUNITY): Payer: Self-pay

## 2023-03-08 ENCOUNTER — Ambulatory Visit (INDEPENDENT_AMBULATORY_CARE_PROVIDER_SITE_OTHER): Payer: Commercial Managed Care - PPO | Admitting: Neurology

## 2023-03-08 VITALS — BP 121/68 | HR 76 | Ht 67.0 in | Wt 209.0 lb

## 2023-03-08 DIAGNOSIS — R202 Paresthesia of skin: Secondary | ICD-10-CM | POA: Diagnosis not present

## 2023-03-08 DIAGNOSIS — R519 Headache, unspecified: Secondary | ICD-10-CM | POA: Diagnosis not present

## 2023-03-08 MED ORDER — GABAPENTIN 300 MG PO CAPS
600.0000 mg | ORAL_CAPSULE | Freq: Three times a day (TID) | ORAL | 5 refills | Status: DC
  Filled 2023-03-08 (×2): qty 180, 30d supply, fill #0
  Filled 2023-04-05: qty 180, 30d supply, fill #1
  Filled 2023-05-13: qty 180, 30d supply, fill #2
  Filled 2023-06-14: qty 180, 30d supply, fill #3
  Filled 2023-07-14: qty 180, 30d supply, fill #4
  Filled 2023-08-13: qty 180, 30d supply, fill #5

## 2023-03-08 NOTE — Patient Instructions (Signed)
Stop Trokendi/topiramate Increase gabapentin to 2 pills three times daily Follow up 6 months.

## 2023-03-11 ENCOUNTER — Other Ambulatory Visit: Payer: Self-pay | Admitting: Internal Medicine

## 2023-03-12 ENCOUNTER — Other Ambulatory Visit (HOSPITAL_COMMUNITY): Payer: Self-pay

## 2023-03-12 MED ORDER — CYCLOBENZAPRINE HCL 10 MG PO TABS
10.0000 mg | ORAL_TABLET | Freq: Two times a day (BID) | ORAL | 0 refills | Status: DC | PRN
  Filled 2023-03-12: qty 60, 30d supply, fill #0

## 2023-03-12 NOTE — Telephone Encounter (Signed)
 Requested medication (s) are due for refill today: Yes  Requested medication (s) are on the active medication list: Yes  Last refill:  02/09/23  Future visit scheduled: Yes  Notes to clinic:  Not delegated.    Requested Prescriptions  Pending Prescriptions Disp Refills   cyclobenzaprine  (FLEXERIL ) 10 MG tablet 60 tablet 0    Sig: Take 1 tablet (10 mg total) by mouth 2 (two) times daily as needed.     Not Delegated - Analgesics:  Muscle Relaxants Failed - 03/12/2023 10:04 AM      Failed - This refill cannot be delegated      Passed - Valid encounter within last 6 months    Recent Outpatient Visits           4 months ago Type 2 diabetes mellitus without complication, without long-term current use of insulin [facility])   [place] [facility] [place], Angeline ORN, NP   10 months ago Primary hypertension   [place] [facility] [place], Angeline ORN, NP   10 months ago Dyspnea on exertion   [place] [facility] [place], Marsa PARAS, DO   10 months ago Encounter for general adult medical examination with abnormal findings   [place] [facility] [place], [place] W, NP   1 year ago Type 2 diabetes mellitus without complication, without long-term current use of insulin [facility])   [place] [facility] [place], Angeline ORN, NP       Future Appointments             In 1 month Baity, Angeline ORN, NP [place] [facility]

## 2023-04-02 ENCOUNTER — Other Ambulatory Visit: Payer: Self-pay

## 2023-04-02 ENCOUNTER — Other Ambulatory Visit: Payer: Self-pay | Admitting: Internal Medicine

## 2023-04-05 ENCOUNTER — Other Ambulatory Visit: Payer: Self-pay

## 2023-04-05 ENCOUNTER — Other Ambulatory Visit (HOSPITAL_COMMUNITY): Payer: Self-pay

## 2023-04-05 MED ORDER — ZOLPIDEM TARTRATE ER 12.5 MG PO TBCR
12.5000 mg | EXTENDED_RELEASE_TABLET | Freq: Every day | ORAL | 0 refills | Status: DC
  Filled 2023-04-05: qty 30, 30d supply, fill #0

## 2023-04-05 MED ORDER — CLONAZEPAM 0.5 MG PO TABS
0.5000 mg | ORAL_TABLET | Freq: Every day | ORAL | 0 refills | Status: DC
  Filled 2023-04-05: qty 30, 30d supply, fill #0

## 2023-04-05 MED ORDER — ALBUTEROL SULFATE HFA 108 (90 BASE) MCG/ACT IN AERS
1.0000 | INHALATION_SPRAY | RESPIRATORY_TRACT | 0 refills | Status: DC | PRN
Start: 2023-04-05 — End: 2023-07-14
  Filled 2023-04-05: qty 6.7, 25d supply, fill #0

## 2023-04-05 NOTE — Telephone Encounter (Signed)
 Requested Prescriptions  Pending Prescriptions Disp Refills   albuterol (VENTOLIN HFA) 108 (90 Base) MCG/ACT inhaler 6.7 g 0    Sig: Inhale 1-2 puffs into the lungs every 4 (four) hours as needed for wheezing or shortness of breath     Pulmonology:  Beta Agonists 2 Passed - 04/05/2023 11:41 AM      Passed - Last BP in normal range    BP Readings from Last 1 Encounters:  03/08/23 121/68         Passed - Last Heart Rate in normal range    Pulse Readings from Last 1 Encounters:  03/08/23 76         Passed - Valid encounter within last 12 months    Recent Outpatient Visits           5 months ago Type 2 diabetes mellitus without complication, without long-term current use of insulin [facility])   [place] [facility] [place], Salvadore Oxford, NP   10 months ago Primary hypertension   [place] [facility] [place], Salvadore Oxford, NP   11 months ago Dyspnea on exertion   [place] [facility] Smitty Cords, DO   11 months ago Encounter for general adult medical examination with abnormal findings   [place] [facility] [place], [place] W, NP   1 year ago Type 2 diabetes mellitus without complication, without long-term current use of insulin [facility])   [place] [facility] [place], [place] W, NP               clonazePAM (KLONOPIN) 0.5 MG tablet 30 tablet 0    Sig: Take 1 tablet (0.5 mg total) by mouth at bedtime.     Not Delegated - Psychiatry: Anxiolytics/Hypnotics 2 Failed - 04/05/2023 11:41 AM      Failed - This refill cannot be delegated      Failed - Urine Drug Screen completed in last 360 days      Passed - Patient is not pregnant      Passed - Valid encounter within last 6 months    Recent Outpatient Visits           5 months ago Type 2 diabetes mellitus without complication, without long-term current use of insulin [facility])   [place] [facility] [place], Salvadore Oxford, NP   10 months  ago Primary hypertension   [place] [facility] [place], Salvadore Oxford, NP   11 months ago Dyspnea on exertion   [place] [facility] [place] [place] Smitty Cords, DO   11 months ago Encounter for general adult medical examination with abnormal findings   [place] [facility] [place], [place] W, NP   1 year ago Type 2 diabetes mellitus without complication, without long-term current use of insulin [facility])   [place] [facility] [place], [place] W, NP               zolpidem (AMBIEN CR) 12.5 MG CR tablet 30 tablet 0    Sig: Take 1 tablet (12.5 mg total) by mouth at bedtime.     Not Delegated - Psychiatry:  Anxiolytics/Hypnotics Failed - 04/05/2023 11:41 AM      Failed - This refill cannot be delegated      Failed - Urine Drug Screen completed in last 360 days      Passed - Valid encounter within last 6 months  Recent Outpatient Visits           5 months ago Type 2 diabetes mellitus without complication, without long-term current use of insulin [facility])   [place] [facility] [place], Salvadore Oxford, NP   10 months ago Primary hypertension   [place] [facility] [place], Salvadore Oxford, NP   11 months ago Dyspnea on exertion   [place] [facility] Smitty Cords, DO   11 months ago Encounter for general adult medical examination with abnormal findings   [place] [facility] [place], [place], NP   1 year ago Type 2 diabetes mellitus without complication, without long-term current use of insulin [facility])   [place] [facility] [place], Salvadore Oxford, [place]

## 2023-04-05 NOTE — Telephone Encounter (Signed)
 Requested medication (s) are due for refill today- yes  Requested medication (s) are on the active medication list -yes  Future visit scheduled -yes  Last refill: 12/03/23 #30- both  Notes to clinic: non delegated Rx  Requested Prescriptions  Pending Prescriptions Disp Refills   clonazePAM (KLONOPIN) 0.5 MG tablet 30 tablet 0    Sig: Take 1 tablet (0.5 mg total) by mouth at bedtime.     Not Delegated - Psychiatry: Anxiolytics/Hypnotics 2 Failed - 04/05/2023 11:42 AM      Failed - This refill cannot be delegated      Failed - Urine Drug Screen completed in last 360 days      Passed - Patient is not pregnant      Passed - Valid encounter within last 6 months    Recent Outpatient Visits           5 months ago Type 2 diabetes mellitus without complication, without long-term current use of insulin [facility])   [place] [facility] [place], Salvadore Oxford, NP   10 months ago Primary hypertension   [place] [facility] [place], Salvadore Oxford, NP   11 months ago Dyspnea on exertion   [place] [facility] Smitty Cords, DO   11 months ago Encounter for general adult medical examination with abnormal findings   [place] [facility] [place], [place] W, NP   1 year ago Type 2 diabetes mellitus without complication, without long-term current use of insulin [facility])   [place] [facility] [place], [place] W, NP               zolpidem (AMBIEN CR) 12.5 MG CR tablet 30 tablet 0    Sig: Take 1 tablet (12.5 mg total) by mouth at bedtime.     Not Delegated - Psychiatry:  Anxiolytics/Hypnotics Failed - 04/05/2023 11:42 AM      Failed - This refill cannot be delegated      Failed - Urine Drug Screen completed in last 360 days      Passed - Valid encounter within last 6 months    Recent Outpatient Visits           5 months ago Type 2 diabetes mellitus without complication, without long-term current use of  insulin [facility])   [place] [facility] [place], Salvadore Oxford, NP   10 months ago Primary hypertension   [place] [facility] [place], [place] W, NP   11 months ago Dyspnea on exertion   [place] [facility] Smitty Cords, DO   11 months ago Encounter for general adult medical examination with abnormal findings   [place] [facility] [place], Salvadore Oxford, NP   1 year ago Type 2 diabetes mellitus without complication, without long-term current use of insulin [facility])   [place] [facility] [place], Salvadore Oxford, NP              Signed Prescriptions Disp Refills   albuterol (VENTOLIN HFA) 108 (90 Base) MCG/ACT inhaler 6.7 g 0    Sig: Inhale 1-2 puffs into the lungs every 4 (four) hours as needed for wheezing or shortness of breath     Pulmonology:  Beta Agonists 2 Passed - 04/05/2023 11:42 AM      Passed - Last BP in normal range    BP Readings from Last 1 Encounters:  03/08/23 121/68  Passed - Last Heart Rate in normal range    Pulse Readings from Last 1 Encounters:  03/08/23 76         Passed - Valid encounter within last 12 months    Recent Outpatient Visits           5 months ago Type 2 diabetes mellitus without complication, without long-term current use of insulin [facility])   [place] [facility] [place], Salvadore Oxford, NP   10 months ago Primary hypertension   [place] [facility] [place], Salvadore Oxford, NP   11 months ago Dyspnea on exertion   [place] [facility] Smitty Cords, DO   11 months ago Encounter for general adult medical examination with abnormal findings   [place] [facility] [place], [place] W, NP   1 year ago Type 2 diabetes mellitus without complication, without long-term current use of insulin [facility])   [place] [facility] [place], Salvadore Oxford, NP                 Requested  Prescriptions  Pending Prescriptions Disp Refills   clonazePAM (KLONOPIN) 0.5 MG tablet 30 tablet 0    Sig: Take 1 tablet (0.5 mg total) by mouth at bedtime.     Not Delegated - Psychiatry: Anxiolytics/Hypnotics 2 Failed - 04/05/2023 11:42 AM      Failed - This refill cannot be delegated      Failed - Urine Drug Screen completed in last 360 days      Passed - Patient is not pregnant      Passed - Valid encounter within last 6 months    Recent Outpatient Visits           5 months ago Type 2 diabetes mellitus without complication, without long-term current use of insulin [facility])   [place] [facility] [place], Salvadore Oxford, NP   10 months ago Primary hypertension   [place] [facility] [place], Salvadore Oxford, NP   11 months ago Dyspnea on exertion   [place] [facility] Smitty Cords, DO   11 months ago Encounter for general adult medical examination with abnormal findings   [place] [facility] [place], [place] W, NP   1 year ago Type 2 diabetes mellitus without complication, without long-term current use of insulin [facility])   [place] [facility] [place], [place], NP               zolpidem (AMBIEN CR) 12.5 MG CR tablet 30 tablet 0    Sig: Take 1 tablet (12.5 mg total) by mouth at bedtime.     Not Delegated - Psychiatry:  Anxiolytics/Hypnotics Failed - 04/05/2023 11:42 AM      Failed - This refill cannot be delegated      Failed - Urine Drug Screen completed in last 360 days      Passed - Valid encounter within last 6 months    Recent Outpatient Visits           5 months ago Type 2 diabetes mellitus without complication, without long-term current use of insulin [facility])   [place] [facility] [place], Salvadore Oxford, NP   10 months ago Primary hypertension   [place] [facility] [place], Salvadore Oxford, NP   11 months ago Dyspnea on exertion   [place] [facility] [place], Netta Neat, DO   11 months ago Encounter for general adult medical examination with abnormal findings   [place] [facility] [place], Salvadore Oxford, NP   1 year ago Type 2 diabetes mellitus without complication, without long-term current use of insulin [facility])   [place] [facility] [place], Salvadore Oxford, NP              Signed Prescriptions Disp Refills   albuterol (VENTOLIN HFA) 108 (90 Base) MCG/ACT inhaler 6.7 g 0    Sig: Inhale 1-2 puffs into the lungs every 4 (four) hours as needed for wheezing or shortness of breath     Pulmonology:  Beta Agonists 2 Passed - 04/05/2023 11:42 AM      Passed - Last BP in normal range    BP Readings from Last 1 Encounters:  03/08/23 121/68         Passed - Last Heart Rate in normal range    Pulse Readings from Last 1 Encounters:  03/08/23 76         Passed - Valid encounter within last 12 months    Recent Outpatient Visits           5 months ago Type 2 diabetes mellitus without complication, without long-term current use of insulin [facility])   [place] [facility] [place], Salvadore Oxford, NP   10 months ago Primary hypertension   [place] [facility] [place], Salvadore Oxford, NP   11 months ago Dyspnea on exertion   [place] [facility] Smitty Cords, DO   11 months ago Encounter for general adult medical examination with abnormal findings    [facility] [place], [place] W, NP   1 year ago Type 2 diabetes mellitus without complication, without long-term current use of insulin [facility])    [facility] [place], Salvadore Oxford, [place]

## 2023-04-12 ENCOUNTER — Other Ambulatory Visit: Payer: Self-pay

## 2023-04-12 ENCOUNTER — Other Ambulatory Visit: Payer: Self-pay | Admitting: Internal Medicine

## 2023-04-13 ENCOUNTER — Other Ambulatory Visit (HOSPITAL_COMMUNITY): Payer: Self-pay

## 2023-04-13 MED ORDER — CYCLOBENZAPRINE HCL 10 MG PO TABS
10.0000 mg | ORAL_TABLET | Freq: Two times a day (BID) | ORAL | 0 refills | Status: DC | PRN
  Filled 2023-04-13: qty 60, 30d supply, fill #0

## 2023-04-13 NOTE — Telephone Encounter (Signed)
 Requested medications are due for refill today.  yes  Requested medications are on the active medications list.  yes  Last refill. 03/12/2023 #60 0 rf  Future visit scheduled.   yes  Notes to clinic.  Refill not delegated.    Requested Prescriptions  Pending Prescriptions Disp Refills   cyclobenzaprine (FLEXERIL) 10 MG tablet 60 tablet 0    Sig: Take 1 tablet (10 mg total) by mouth 2 (two) times daily as needed.     Not Delegated - Analgesics:  Muscle Relaxants Failed - 04/13/2023  2:19 PM      Failed - This refill cannot be delegated      Passed - Valid encounter within last 6 months    Recent Outpatient Visits           6 months ago Type 2 diabetes mellitus without complication, without long-term current use of insulin [facility])   [place] [facility] [place], Salvadore Oxford, NP   11 months ago Primary hypertension   [place] [facility] [place], Salvadore Oxford, NP   11 months ago Dyspnea on exertion   [place] [facility] Smitty Cords, DO   11 months ago Encounter for general adult medical examination with abnormal findings   [place] [facility] [place], [place] W, NP   1 year ago Type 2 diabetes mellitus without complication, without long-term current use of insulin [facility])   [place] [facility] [place], Salvadore Oxford, [place]

## 2023-04-15 ENCOUNTER — Telehealth: Admitting: Physician Assistant

## 2023-04-15 ENCOUNTER — Other Ambulatory Visit: Payer: Self-pay

## 2023-04-15 DIAGNOSIS — J069 Acute upper respiratory infection, unspecified: Secondary | ICD-10-CM

## 2023-04-15 MED ORDER — IPRATROPIUM BROMIDE 0.03 % NA SOLN
2.0000 | Freq: Two times a day (BID) | NASAL | 0 refills | Status: AC
  Filled 2023-04-15: qty 30, 43d supply, fill #0

## 2023-04-15 NOTE — Progress Notes (Signed)
 I have spent 5 minutes in review of e-visit questionnaire, review and updating patient chart, medical decision making and response to patient.   Piedad Climes, PA-C

## 2023-04-15 NOTE — Progress Notes (Signed)

## 2023-04-21 ENCOUNTER — Other Ambulatory Visit: Payer: Self-pay | Admitting: Internal Medicine

## 2023-04-21 DIAGNOSIS — K219 Gastro-esophageal reflux disease without esophagitis: Secondary | ICD-10-CM

## 2023-04-22 ENCOUNTER — Other Ambulatory Visit: Payer: Self-pay

## 2023-04-22 MED ORDER — PANTOPRAZOLE SODIUM 40 MG PO TBEC
40.0000 mg | DELAYED_RELEASE_TABLET | Freq: Two times a day (BID) | ORAL | 0 refills | Status: DC
  Filled 2023-04-22 – 2023-05-05 (×3): qty 180, 90d supply, fill #0

## 2023-04-22 NOTE — Telephone Encounter (Signed)
 Requested Prescriptions  Pending Prescriptions Disp Refills   pantoprazole (PROTONIX) 40 MG tablet 180 tablet 0    Sig: Take 1 tablet (40 mg total) by mouth 2 (two) times daily.     Gastroenterology: Proton Pump Inhibitors Passed - 04/22/2023 12:03 PM      Passed - Valid encounter within last 12 months    Recent Outpatient Visits           6 months ago Type 2 diabetes mellitus without complication, without long-term current use of insulin [facility])   [place] [facility] [place], Salvadore Oxford, NP   11 months ago Primary hypertension   [place] [place], Salvadore Oxford, NP   11 months ago Dyspnea on exertion   [place] [facility] Smitty Cords, DO   11 months ago Encounter for general adult medical examination with abnormal findings   [place] [facility] [place], [place], NP   1 year ago Type 2 diabetes mellitus without complication, without long-term current use of insulin [facility])   [place] [facility] [place], Salvadore [place], [place]

## 2023-05-03 ENCOUNTER — Other Ambulatory Visit: Payer: Self-pay

## 2023-05-03 ENCOUNTER — Ambulatory Visit (INDEPENDENT_AMBULATORY_CARE_PROVIDER_SITE_OTHER): Payer: Commercial Managed Care - PPO | Admitting: Internal Medicine

## 2023-05-03 VITALS — BP 122/70 | Ht 67.0 in | Wt 225.6 lb

## 2023-05-03 DIAGNOSIS — Z0001 Encounter for general adult medical examination with abnormal findings: Secondary | ICD-10-CM | POA: Diagnosis not present

## 2023-05-03 DIAGNOSIS — Z125 Encounter for screening for malignant neoplasm of prostate: Secondary | ICD-10-CM

## 2023-05-03 DIAGNOSIS — E119 Type 2 diabetes mellitus without complications: Secondary | ICD-10-CM | POA: Diagnosis not present

## 2023-05-03 DIAGNOSIS — E66812 Obesity, class 2: Secondary | ICD-10-CM | POA: Diagnosis not present

## 2023-05-03 DIAGNOSIS — Z6835 Body mass index (BMI) 35.0-35.9, adult: Secondary | ICD-10-CM | POA: Diagnosis not present

## 2023-05-03 MED ORDER — ZOLPIDEM TARTRATE ER 12.5 MG PO TBCR
12.5000 mg | EXTENDED_RELEASE_TABLET | Freq: Every day | ORAL | 0 refills | Status: DC
  Filled 2023-05-03 – 2023-05-05 (×2): qty 30, 30d supply, fill #0

## 2023-05-03 MED ORDER — CLONAZEPAM 0.5 MG PO TABS
0.5000 mg | ORAL_TABLET | Freq: Every day | ORAL | 0 refills | Status: DC
  Filled 2023-05-03 – 2023-05-05 (×2): qty 30, 30d supply, fill #0

## 2023-05-03 NOTE — Progress Notes (Signed)
 Subjective:    Patient ID: [name], male    DOB: [birth year], 53 y.o.   MRN: [id number]  HPI  Patient presents to clinic today for his annual exam.    Flu: 11/2022 Tetanus: 10/2018 COVID: Pfizer x 3 Pneumovax: 04/2021 Shingrix: 05/2022, 10/2022 PSA screening: 04/2022 Colon screening: 07/2021 Vision screening: annually Dentist: as needed  Diet: He does eat meat. He consumes fruits and veggies. He tries to avoid fried foods. He drinks mostly water, sugar free Koolaid Exercise: Walking, situps  Review of Systems     Past Medical History:  Diagnosis Date   Anxiety    takes Xanax daily prn, panic attacks   Chronic gastric ulcer with bleeding s/p suture repair 2012 09/29/2010   Head 3 EGD which failed to stop the bleeding. Had exploratory laparotomy on 10/04/2010. Possible cause of the gastric ulcer was NSAID use.    Clotting disorder (HCC)    Constipation    related to pain meds   Depression    Diabetes mellitus without complication (HCC)    Dizziness    occasionally   Dyspnea    walking a bit   Erosive esophagitis    External hemorrhoid, bleeding    Gastric ulcer    history of   GERD (gastroesophageal reflux disease)    takes Protonix bid   Hiatal hernia    History of blood clots 2012   in abdomen   History of blood transfusion 2012   Hyperlipidemia    Hypertension    Incisional hernia s/p open repair w mesh Aug 2013 05/26/2011   Joint pain    knees   Nocturia    depends on amount of fluid he drinks   Pneumonia 2018   Pre-diabetes    Primary localized osteoarthritis of left hip 04/24/2014   Serrated polyp of colon    Sleep apnea    no cpap  mild no cpap needed   Stroke [facility])    Unstable angina (HCC) 04/09/2020   Upper GI bleed    Ventral hernia     Current Outpatient Medications  Medication Sig Dispense Refill   albuterol (VENTOLIN HFA) 108 (90 Base) MCG/ACT inhaler Inhale 1-2 puffs into the lungs every 4 (four) hours as needed for wheezing or  shortness of breath 6.7 g 0   amLODipine (NORVASC) 10 MG tablet Take 1 tablet (10 mg total) by mouth daily. Please call our to schedule an overdue appointment with Dr. Shari Prows before anymore refills. [phone]. Thank you. 3rd attempt. 90 tablet 0   aspirin EC 81 MG EC tablet Take 1 tablet (81 mg total) by mouth daily.     carvedilol (COREG) 3.125 MG tablet Take 1 tablet (3.125 mg total) by mouth 2 (two) times daily with a meal. 180 tablet 1   clonazePAM (KLONOPIN) 0.5 MG tablet Take 1 tablet (0.5 mg total) by mouth at bedtime. 30 tablet 0   cyclobenzaprine (FLEXERIL) 10 MG tablet Take 1 tablet (10 mg total) by mouth 2 (two) times daily as needed. 60 tablet 0   fluticasone (FLONASE) 50 MCG/ACT nasal spray Place 2 sprays into both nostrils daily as needed for allergies. 16 g 11   gabapentin (NEURONTIN) 300 MG capsule Take 2 capsules (600 mg total) by mouth 3 (three) times daily. 180 capsule 5   ipratropium (ATROVENT) 0.03 % nasal spray Place 2 sprays into both nostrils every 12 (twelve) hours. 30 mL 0   lisinopril (ZESTRIL) 10 MG tablet Take 1 tablet (10 mg total)  by mouth daily. Please call our to schedule an overdue appointment with Dr. Shari Prows before anymore refills. [phone]. Thank you. 3rd and final attempt. 90 tablet 1   ondansetron (ZOFRAN) 4 MG tablet Take 1 tablet (4 mg total) by mouth every 8 (eight) hours as needed for nausea or vomiting. 20 tablet 1   pantoprazole (PROTONIX) 40 MG tablet Take 1 tablet (40 mg total) by mouth 2 (two) times daily. 180 tablet 0   rosuvastatin (CRESTOR) 5 MG tablet Take 1 tablet (5 mg total) by mouth daily. 90 tablet 3   zolpidem (AMBIEN CR) 12.5 MG CR tablet Take 1 tablet (12.5 mg total) by mouth at bedtime. 30 tablet 0   No current facility-administered medications for this visit.    Allergies  Allergen Reactions   Dilaudid [Hydromorphone Hcl] Rash and Other (See Comments)    Shakey   Hydromorphone Hcl Other (See Comments) and Rash    Shakey    Metoclopramide Other (See Comments), Rash, Swelling and Hives    Swelling in lips   Metoclopramide Hcl Hives, Swelling, Rash and Other (See Comments)    Swelling in lips   Nsaids     H/o bleeding gastric ulcers   Benadryl [Diphenhydramine]     Causes patient to become hyper and pace    Family History  Problem Relation Age of Onset   Diabetes Mother    Liver disease Mother    Diabetes Father    Heart disease Father    Healthy Sister    Healthy Brother    Healthy Brother    Colon cancer Neg Hx    Stomach cancer Neg Hx    Rectal cancer Neg Hx    Esophageal cancer Neg Hx    Colon polyps Neg Hx    Prostate cancer Neg Hx     Social History   Socioeconomic History   Marital status: Single    Spouse name: Not on file   Number of children: 1   Years of education: 12   Highest education level: 12th grade  Occupational History   Occupation: Maintenance - [place]  Tobacco Use   Smoking status: Never   Smokeless tobacco: Current    Types: Chew   Tobacco comments:    occ-Chew    None on 07/15/21  Vaping Use   Vaping status: Never Used  Substance and Sexual Activity   Alcohol use: Yes    Alcohol/week: 0.0 standard drinks of alcohol    Comment: 09/04/11 "very seldom"   Drug use: Never   Sexual activity: Yes  Other Topics Concern   Not on file  Social History Narrative   Lives with brother in a one story home.  Has one daughter.  Works in Production designer, theatre/television/film for [employer].  Education: high school.       Patient is left-handed.   Social Drivers of Corporate investment banker Strain: Low Risk  (05/03/2023)   Overall Financial Resource Strain (CARDIA)    Difficulty of Paying Living Expenses: Not very hard  Food Insecurity: Food Insecurity Present (05/03/2023)   Hunger Vital Sign    Worried About Running Out of Food in the Last Year: Sometimes true    Ran Out of Food in the Last Year: Sometimes true  Transportation Needs: No Transportation Needs (05/03/2023)   PRAPARE -  Administrator, Civil Service (Medical): No    Lack of Transportation (Non-Medical): No  Physical Activity: Sufficiently Active (05/03/2023)   Exercise Vital Sign  Days of Exercise per Week: 5 days    Minutes of Exercise per Session: 60 min  Stress: No Stress Concern Present (05/03/2023)   [employer] of Occupational Health - Occupational Stress Questionnaire    Feeling of Stress : Only a little  Social Connections: Moderately Isolated (05/03/2023)   Social Connection and Isolation Panel [NHANES]    Frequency of Communication with Friends and Family: More than three times a week    Frequency of Social Gatherings with Friends and Family: Once a week    Attends Religious Services: 1 to 4 times per year    Active Member of [employer] or Organizations: No    Attends Engineer, structural: Not on file    Marital Status: Divorced  Intimate Partner Violence: Not on file     Constitutional: Patient reports headaches.  Denies fever, malaise, fatigue, or abrupt weight changes.  HEENT: Denies eye pain, eye redness, ear pain, ringing in the ears, wax buildup, runny nose, nasal congestion, bloody nose, or sore throat. Respiratory: Denies difficulty breathing, shortness of breath, cough or sputum production.   Cardiovascular: Denies chest pain, chest tightness, palpitations or swelling in the hands or feet.  Gastrointestinal: Denies abdominal pain, bloating, constipation, diarrhea or blood in the stool.  GU: Denies urgency, frequency, pain with urination, burning sensation, blood in urine, odor or discharge. Musculoskeletal: Denies decrease in range of motion, difficulty with gait, muscle pain or joint pain and swelling.  Skin: Denies redness, rashes, lesions or ulcercations.  Neurological: Patient reports insomnia, paresthesias of hands.  Denies dizziness, difficulty with memory, difficulty with speech or problems with balance and coordination.  Psych: Patient has a history of  anxiety.  Denies depression, SI/HI.  No other specific complaints in a complete review of systems (except as listed in HPI above).  Objective:   Physical Exam  BP 122/70 (BP Location: Left Arm, Patient Position: Sitting, Cuff Size: Normal)   Ht 5\' 7"  (1.702 m)   Wt 225 lb 9.6 oz (102.3 kg)   BMI 35.33 kg/m    Wt Readings from Last 3 Encounters:  03/08/23 209 lb (94.8 kg)  10/15/22 209 lb (94.8 kg)  05/15/22 201 lb (91.2 kg)    General: Appears his stated age, obese, in NAD. Skin: Warm, dry and intact. No ulcerations noted. HEENT: Head: normal shape and size; Eyes: sclera white, no icterus, conjunctiva pink, PERRLA and EOMs intact;  Neck:  Neck supple, trachea midline. No masses, lumps or thyromegaly present.  Cardiovascular: Normal rate and rhythm. S1,S2 noted.  No murmur, rubs or gallops noted. No JVD or BLE edema. No carotid bruits noted. Pulmonary/Chest: Normal effort and positive vesicular breath sounds. No respiratory distress. No wheezes, rales or ronchi noted.  Abdomen: Soft and nontender. Normal bowel sounds.  Musculoskeletal: Strength 5/5 BUE/BLE.  No difficulty with gait.  Neurological: Alert and oriented. Cranial nerves II-XII grossly intact. Coordination normal.  Psychiatric: Mood and affect normal. Behavior is normal. Judgment and thought content normal.     BMET    Component Value Date/Time   NA 138 10/15/2022 1123   NA 142 04/26/2020 0849   K 5.0 10/15/2022 1123   CL 105 10/15/2022 1123   CO2 24 10/15/2022 1123   GLUCOSE 106 (H) 10/15/2022 1123   BUN 20 10/15/2022 1123   BUN 18 04/26/2020 0849   CREATININE 1.05 10/15/2022 1123   CALCIUM 9.5 10/15/2022 1123   GFRNONAA >60 04/11/2020 0320   GFRAA 96 12/12/2019 1134    Lipid Panel  Component Value Date/Time   CHOL 153 10/15/2022 1123   CHOL 112 11/28/2020 0746   TRIG 97 10/15/2022 1123   HDL 68 10/15/2022 1123   HDL 60 11/28/2020 0746   CHOLHDL 2.3 10/15/2022 1123   VLDL 32 04/10/2020 0402    LDLCALC 67 10/15/2022 1123    CBC    Component Value Date/Time   WBC 8.3 10/15/2022 1123   RBC 4.88 10/15/2022 1123   HGB 15.2 10/15/2022 1123   HCT 44.9 10/15/2022 1123   PLT 334 10/15/2022 1123   MCV 92.0 10/15/2022 1123   MCH 31.1 10/15/2022 1123   MCHC 33.9 10/15/2022 1123   RDW 12.2 10/15/2022 1123   LYMPHSABS 2,842.8 05/06/2022 1601   MONOABS 1.0 04/09/2020 1553   EOSABS 147 05/06/2022 1601   BASOSABS 37 05/06/2022 1601    Hgb A1C Lab Results  Component Value Date   HGBA1C 6.2 10/15/2022           Assessment & Plan:   Preventative Health Maintenance:  Encouraged him to get a flu shot in the fall Tetanus UTD COVID-vaccine UTD Pneumovax UTD Shingrix UTD Colon screening UTD Encouraged him to consume a balanced diet and exercise regimen Advised him to see an eye doctor and dentist annually We will check CBC, c-Met, lipid, A1c and PSA today  RTC in 6 months for follow-up of chronic conditions Nicki Reaper, NP

## 2023-05-03 NOTE — Patient Instructions (Signed)
 Health Maintenance, Male  Adopting a healthy lifestyle and getting preventive care are important in promoting health and wellness. Ask your health care provider about:  The right schedule for you to have regular tests and exams.  Things you can do on your own to prevent diseases and keep yourself healthy.  What should I know about diet, weight, and exercise?  Eat a healthy diet    Eat a diet that includes plenty of vegetables, fruits, low-fat dairy products, and lean protein.  Do not eat a lot of foods that are high in solid fats, added sugars, or sodium.  Maintain a healthy weight  Body mass index (BMI) is a measurement that can be used to identify possible weight problems. It estimates body fat based on height and weight. Your health care provider can help determine your BMI and help you achieve or maintain a healthy weight.  Get regular exercise  Get regular exercise. This is one of the most important things you can do for your health. Most adults should:  Exercise for at least 150 minutes each week. The exercise should increase your heart rate and make you sweat (moderate-intensity exercise).  Do strengthening exercises at least twice a week. This is in addition to the moderate-intensity exercise.  Spend less time sitting. Even light physical activity can be beneficial.  Watch cholesterol and blood lipids  Have your blood tested for lipids and cholesterol at 53 years of age, then have this test every 5 years.  You may need to have your cholesterol levels checked more often if:  Your lipid or cholesterol levels are high.  You are older than 53 years of age.  You are at high risk for heart disease.  What should I know about cancer screening?  Many types of cancers can be detected early and may often be prevented. Depending on your health history and family history, you may need to have cancer screening at various ages. This may include screening for:  Colorectal cancer.  Prostate cancer.  Skin cancer.  Lung  cancer.  What should I know about heart disease, diabetes, and high blood pressure?  Blood pressure and heart disease  High blood pressure causes heart disease and increases the risk of stroke. This is more likely to develop in people who have high blood pressure readings or are overweight.  Talk with your health care provider about your target blood pressure readings.  Have your blood pressure checked:  Every 3-5 years if you are 9-95 years of age.  Every year if you are 85 years old or older.  If you are between the ages of 29 and 29 and are a current or former smoker, ask your health care provider if you should have a one-time screening for abdominal aortic aneurysm (AAA).  Diabetes  Have regular diabetes screenings. This checks your fasting blood sugar level. Have the screening done:  Once every three years after age 23 if you are at a normal weight and have a low risk for diabetes.  More often and at a younger age if you are overweight or have a high risk for diabetes.  What should I know about preventing infection?  Hepatitis B  If you have a higher risk for hepatitis B, you should be screened for this virus. Talk with your health care provider to find out if you are at risk for hepatitis B infection.  Hepatitis C  Blood testing is recommended for:  Everyone born from [birth year] through 1965.  Anyone  with known risk factors for hepatitis C.  Sexually transmitted infections (STIs)  You should be screened each year for STIs, including gonorrhea and chlamydia, if:  You are sexually active and are younger than 53 years of age.  You are older than 53 years of age and your health care provider tells you that you are at risk for this type of infection.  Your sexual activity has changed since you were last screened, and you are at increased risk for chlamydia or gonorrhea. Ask your health care provider if you are at risk.  Ask your health care provider about whether you are at high risk for HIV. Your health care provider  may recommend a prescription medicine to help prevent HIV infection. If you choose to take medicine to prevent HIV, you should first get tested for HIV. You should then be tested every 3 months for as long as you are taking the medicine.  Follow these instructions at home:  Alcohol use  Do not drink alcohol if your health care provider tells you not to drink.  If you drink alcohol:  Limit how much you have to 0-2 drinks a day.  Know how much alcohol is in your drink. In the [place] one drink equals one 12 oz bottle of beer (355 mL), one 5 oz glass of wine (148 mL), or one 1 oz glass of hard liquor (44 mL).  Lifestyle  Do not use any products that contain nicotine or tobacco. These products include cigarettes, chewing tobacco, and vaping devices, such as e-cigarettes. If you need help quitting, ask your health care provider.  Do not use street drugs.  Do not share needles.  Ask your health care provider for help if you need support or information about quitting drugs.  General instructions  Schedule regular health, dental, and eye exams.  Stay current with your vaccines.  Tell your health care provider if:  You often feel depressed.  You have ever been abused or do not feel safe at home.  Summary  Adopting a healthy lifestyle and getting preventive care are important in promoting health and wellness.  Follow your health care provider's instructions about healthy diet, exercising, and getting tested or screened for diseases.  Follow your health care provider's instructions on monitoring your cholesterol and blood pressure.  This information is not intended to replace advice given to you by your health care provider. Make sure you discuss any questions you have with your health care provider.  Document Revised: 06/10/2020 Document Reviewed: 06/10/2020  Elsevier Patient Education  2024 [employer].

## 2023-05-03 NOTE — Assessment & Plan Note (Signed)
 Encourage diet and exercise for weight loss

## 2023-05-04 ENCOUNTER — Encounter: Payer: Self-pay | Admitting: Internal Medicine

## 2023-05-04 LAB — LIPID PANEL
Cholesterol: 152 mg/dL (ref <200)
HDL: 72 mg/dL (ref >=40)
LDL Cholesterol (Calc): 64 mg/dL
Non-HDL Cholesterol (Calc): 80 mg/dL (ref <130)
Total CHOL/HDL Ratio: 2.1 (calc) (ref <5.0)
Triglycerides: 75 mg/dL (ref <150)

## 2023-05-04 LAB — CBC
HCT: 46.6 % (ref 38.5–50.0)
Hemoglobin: 15.4 g/dL (ref 13.2–17.1)
MCH: 30.4 pg (ref 27.0–33.0)
MCHC: 33 g/dL (ref 32.0–36.0)
MCV: 92.1 fL (ref 80.0–100.0)
MPV: 10.6 fL (ref 7.5–12.5)
Platelets: 358 10*3/uL (ref 140–400)
RBC: 5.06 10*6/uL (ref 4.20–5.80)
RDW: 13 % (ref 11.0–15.0)
WBC: 8.9 10*3/uL (ref 3.8–10.8)

## 2023-05-04 LAB — COMPREHENSIVE METABOLIC PANEL WITH GFR
AG Ratio: 1.7 (calc) (ref 1.0–2.5)
ALT: 25 U/L (ref 9–46)
AST: 23 U/L (ref 10–35)
Albumin: 4.8 g/dL (ref 3.6–5.1)
Alkaline phosphatase (APISO): 56 U/L (ref 35–144)
BUN: 17 mg/dL (ref 7–25)
CO2: 26 mmol/L (ref 20–32)
Calcium: 10.2 mg/dL (ref 8.6–10.3)
Chloride: 101 mmol/L (ref 98–110)
Creat: 1.1 mg/dL (ref 0.70–1.30)
Globulin: 2.8 g/dL (ref 1.9–3.7)
Glucose, Bld: 127 mg/dL — ABNORMAL HIGH (ref 65–99)
Potassium: 4.8 mmol/L (ref 3.5–5.3)
Sodium: 137 mmol/L (ref 135–146)
Total Bilirubin: 0.5 mg/dL (ref 0.2–1.2)
Total Protein: 7.6 g/dL (ref 6.1–8.1)
eGFR: 81 mL/min/{1.73_m2} (ref >=60)

## 2023-05-04 LAB — PSA: PSA: 2.38 ng/mL (ref <=4.00)

## 2023-05-04 LAB — HEMOGLOBIN A1C
Hgb A1c MFr Bld: 6.4 %{Hb} — ABNORMAL HIGH (ref <5.7)
Mean Plasma Glucose: 137 mg/dL
eAG (mmol/L): 7.6 mmol/L

## 2023-05-05 ENCOUNTER — Other Ambulatory Visit: Payer: Self-pay

## 2023-05-05 ENCOUNTER — Other Ambulatory Visit: Payer: Self-pay | Admitting: Internal Medicine

## 2023-05-06 ENCOUNTER — Other Ambulatory Visit: Payer: Self-pay

## 2023-05-06 MED ORDER — LISINOPRIL 10 MG PO TABS
10.0000 mg | ORAL_TABLET | Freq: Every day | ORAL | 1 refills | Status: DC
  Filled 2023-05-06: qty 90, 90d supply, fill #0
  Filled 2023-08-04: qty 90, 90d supply, fill #1

## 2023-05-06 NOTE — Telephone Encounter (Signed)
 Last OV 05/03/23 Requested Prescriptions  Pending Prescriptions Disp Refills   lisinopril (ZESTRIL) 10 MG tablet 90 tablet 1    Sig: Take 1 tablet (10 mg total) by mouth daily. Please call our to schedule an overdue appointment with Dr. Shari Prows before anymore refills. [phone]. Thank you. 3rd and final attempt.     Cardiovascular:  ACE Inhibitors Failed - 05/06/2023  4:20 PM      Failed - Valid encounter within last 6 months    Recent Outpatient Visits           3 days ago Encounter for general adult medical examination with abnormal findings   [facility] [place], [place] W, NP              Passed - Cr in normal range and within 180 days    Creat  Date Value Ref Range Status  05/03/2023 1.10 0.70 - 1.30 mg/dL Final   Creatinine, Urine  Date Value Ref Range Status  10/15/2022 101 20 - 320 mg/dL Final         Passed - K in normal range and within 180 days    Potassium  Date Value Ref Range Status  05/03/2023 4.8 3.5 - 5.3 mmol/L Final         Passed - Patient is not pregnant      Passed - Last BP in normal range    BP Readings from Last 1 Encounters:  05/03/23 122/70

## 2023-05-07 ENCOUNTER — Other Ambulatory Visit: Payer: Self-pay

## 2023-05-13 ENCOUNTER — Other Ambulatory Visit: Payer: Self-pay

## 2023-05-13 ENCOUNTER — Other Ambulatory Visit: Payer: Self-pay | Admitting: Internal Medicine

## 2023-05-14 ENCOUNTER — Other Ambulatory Visit: Payer: Self-pay

## 2023-05-14 MED ORDER — CYCLOBENZAPRINE HCL 10 MG PO TABS
10.0000 mg | ORAL_TABLET | Freq: Two times a day (BID) | ORAL | 0 refills | Status: DC | PRN
  Filled 2023-05-14: qty 60, 30d supply, fill #0

## 2023-05-14 NOTE — Telephone Encounter (Signed)
 Requested medication (s) are due for refill today - yes  Requested medication (s) are on the active medication list -yes  Future visit scheduled -yes  Last refill: 04/13/23 #60  Notes to clinic: non delegated Rx  Requested Prescriptions  Pending Prescriptions Disp Refills   cyclobenzaprine (FLEXERIL) 10 MG tablet 60 tablet 0    Sig: Take 1 tablet (10 mg total) by mouth 2 (two) times daily as needed.     Not Delegated - Analgesics:  Muscle Relaxants Failed - 05/14/2023  9:17 AM      Failed - This refill cannot be delegated      Failed - Valid encounter within last 6 months    Recent Outpatient Visits           1 week ago Encounter for general adult medical examination with abnormal findings   [facility] [place], [place], NP                 Requested Prescriptions  Pending Prescriptions Disp Refills   cyclobenzaprine (FLEXERIL) 10 MG tablet 60 tablet 0    Sig: Take 1 tablet (10 mg total) by mouth 2 (two) times daily as needed.     Not Delegated - Analgesics:  Muscle Relaxants Failed - 05/14/2023  9:17 AM      Failed - This refill cannot be delegated      Failed - Valid encounter within last 6 months    Recent Outpatient Visits           1 week ago Encounter for general adult medical examination with abnormal findings   [place] [facility] [place], Salvadore Oxford, NP

## 2023-06-03 ENCOUNTER — Other Ambulatory Visit: Payer: Self-pay | Admitting: Internal Medicine

## 2023-06-03 ENCOUNTER — Other Ambulatory Visit: Payer: Self-pay

## 2023-06-07 ENCOUNTER — Other Ambulatory Visit: Payer: Self-pay

## 2023-06-07 ENCOUNTER — Encounter: Payer: Self-pay | Admitting: Internal Medicine

## 2023-06-07 MED ORDER — ONDANSETRON HCL 4 MG PO TABS
4.0000 mg | ORAL_TABLET | Freq: Three times a day (TID) | ORAL | 1 refills | Status: DC | PRN
  Filled 2023-06-07: qty 20, 7d supply, fill #0
  Filled 2023-07-14: qty 20, 7d supply, fill #1

## 2023-06-07 MED ORDER — ZOLPIDEM TARTRATE ER 12.5 MG PO TBCR
12.5000 mg | EXTENDED_RELEASE_TABLET | Freq: Every day | ORAL | 0 refills | Status: DC
  Filled 2023-06-07: qty 30, 30d supply, fill #0

## 2023-06-07 MED ORDER — AMLODIPINE BESYLATE 10 MG PO TABS
10.0000 mg | ORAL_TABLET | Freq: Every day | ORAL | 0 refills | Status: DC
  Filled 2023-06-07: qty 90, 90d supply, fill #0

## 2023-06-07 MED ORDER — CLONAZEPAM 0.5 MG PO TABS
0.5000 mg | ORAL_TABLET | Freq: Every day | ORAL | 0 refills | Status: DC
  Filled 2023-06-07: qty 30, 30d supply, fill #0

## 2023-06-07 NOTE — Telephone Encounter (Signed)
 Requested medication (s) are due for refill today: yes  Requested medication (s) are on the active medication list: yes  Last refill:  05/03/23  Future visit scheduled: no  Notes to clinic:  Unable to refill per protocol, cannot delegate.      Requested Prescriptions  Pending Prescriptions Disp Refills   zolpidem  (AMBIEN  CR) 12.5 MG CR tablet 30 tablet 0    Sig: Take 1 tablet (12.5 mg total) by mouth at bedtime.     Not Delegated - Psychiatry:  Anxiolytics/Hypnotics Failed - 06/07/2023 10:25 AM      Failed - This refill cannot be delegated      Failed - Urine Drug Screen completed in last 360 days      Failed - Valid encounter within last 6 months    Recent Outpatient Visits           1 month ago Encounter for general adult medical examination with abnormal findings   [facility] [place], [place] W, NP               clonazePAM  (KLONOPIN ) 0.5 MG tablet 30 tablet 0    Sig: Take 1 tablet (0.5 mg total) by mouth at bedtime.     Not Delegated - Psychiatry: Anxiolytics/Hypnotics 2 Failed - 06/07/2023 10:25 AM      Failed - This refill cannot be delegated      Failed - Urine Drug Screen completed in last 360 days      Failed - Valid encounter within last 6 months    Recent Outpatient Visits           1 month ago Encounter for general adult medical examination with abnormal findings   [facility] [place], [place], [place]              Passed - Patient is not pregnant

## 2023-06-07 NOTE — Telephone Encounter (Signed)
 Requested medication (s) are due for refill today:   Yes for amlodipine ;   Zofran  provider to review  Requested medication (s) are on the active medication list:   Yes for both  Future visit scheduled:   Yes 10/2 with Leward Record.     LOV 05/03/2023.    There is a note with the amlodipine  that this pt needs to schedule to see Dr. Ardell Beauvais.   [facility] call center does not schedule for this provider.     Last ordered: Zofran  11/03/2022 #20, 1 refill;   Amlodipine  03/04/2023 #90, 0 refills  Unable to refill because this is a non delegated refill - Zofran .     Amlodipine  has a message that pt is to schedule with Dr. Ardell Beauvais for refills of this medication.     Requested Prescriptions  Pending Prescriptions Disp Refills   ondansetron  (ZOFRAN ) 4 MG tablet 20 tablet 1    Sig: Take 1 tablet (4 mg total) by mouth every 8 (eight) hours as needed for nausea or vomiting.     Not Delegated - Gastroenterology: Antiemetics - ondansetron  Failed - 06/07/2023 10:06 AM      Failed - This refill cannot be delegated      Failed - Valid encounter within last 6 months    Recent Outpatient Visits           1 month ago Encounter for general adult medical examination with abnormal findings   [facility] [place], [place] W, NP              Passed - AST in normal range and within 360 days    AST  Date Value Ref Range Status  05/03/2023 23 10 - 35 U/L Final         Passed - ALT in normal range and within 360 days    ALT  Date Value Ref Range Status  05/03/2023 25 9 - 46 U/L Final          amLODipine  (NORVASC ) 10 MG tablet 90 tablet 0    Sig: Take 1 tablet (10 mg total) by mouth daily. Please call our to schedule an overdue appointment with Dr. Ardell Beauvais before anymore refills. [phone]. Thank you. 3rd attempt.     Cardiovascular: Calcium  Channel Blockers 2 Failed - 06/07/2023 10:06 AM      Failed - Valid encounter within last 6 months    Recent Outpatient Visits           1  month ago Encounter for general adult medical examination with abnormal findings    [facility] [place], [place] W, NP              Passed - Last BP in normal range    BP Readings from Last 1 Encounters:  05/03/23 122/70         Passed - Last Heart Rate in normal range    Pulse Readings from Last 1 Encounters:  03/08/23 76

## 2023-06-14 ENCOUNTER — Other Ambulatory Visit: Payer: Self-pay | Admitting: Internal Medicine

## 2023-06-14 ENCOUNTER — Other Ambulatory Visit: Payer: Self-pay

## 2023-06-15 ENCOUNTER — Other Ambulatory Visit: Payer: Self-pay

## 2023-06-16 ENCOUNTER — Other Ambulatory Visit: Payer: Self-pay

## 2023-06-16 MED ORDER — CYCLOBENZAPRINE HCL 10 MG PO TABS
10.0000 mg | ORAL_TABLET | Freq: Two times a day (BID) | ORAL | 0 refills | Status: DC | PRN
  Filled 2023-06-16: qty 60, 30d supply, fill #0

## 2023-06-16 NOTE — Telephone Encounter (Signed)
 Requested medication (s) are due for refill today - yes  Requested medication (s) are on the active medication list -yes  Future visit scheduled -yes  Last refill: 05/14/23 #60  Notes to clinic: non delegated Rx  Requested Prescriptions  Pending Prescriptions Disp Refills   cyclobenzaprine  (FLEXERIL ) 10 MG tablet 60 tablet 0    Sig: Take 1 tablet (10 mg total) by mouth 2 (two) times daily as needed.     Not Delegated - Analgesics:  Muscle Relaxants Failed - 06/16/2023 10:22 AM      Failed - This refill cannot be delegated      Failed - Valid encounter within last 6 months    Recent Outpatient Visits           1 month ago Encounter for general adult medical examination with abnormal findings   [name] [place], [place], NP                 Requested Prescriptions  Pending Prescriptions Disp Refills   cyclobenzaprine  (FLEXERIL ) 10 MG tablet 60 tablet 0    Sig: Take 1 tablet (10 mg total) by mouth 2 (two) times daily as needed.     Not Delegated - Analgesics:  Muscle Relaxants Failed - 06/16/2023 10:22 AM      Failed - This refill cannot be delegated      Failed - Valid encounter within last 6 months    Recent Outpatient Visits           1 month ago Encounter for general adult medical examination with abnormal findings   [facility] [place], [place], NP

## 2023-07-06 ENCOUNTER — Other Ambulatory Visit: Payer: Self-pay | Admitting: Internal Medicine

## 2023-07-07 ENCOUNTER — Other Ambulatory Visit: Payer: Self-pay

## 2023-07-07 MED ORDER — ROSUVASTATIN CALCIUM 5 MG PO TABS
5.0000 mg | ORAL_TABLET | Freq: Every day | ORAL | 3 refills | Status: AC
  Filled 2023-07-07: qty 90, 90d supply, fill #0
  Filled 2023-10-06: qty 90, 90d supply, fill #1
  Filled 2024-01-03: qty 90, 90d supply, fill #2

## 2023-07-07 MED ORDER — ZOLPIDEM TARTRATE ER 12.5 MG PO TBCR
12.5000 mg | EXTENDED_RELEASE_TABLET | Freq: Every day | ORAL | 0 refills | Status: DC
  Filled 2023-07-07: qty 30, 30d supply, fill #0

## 2023-07-07 MED ORDER — CLONAZEPAM 0.5 MG PO TABS
0.5000 mg | ORAL_TABLET | Freq: Every day | ORAL | 0 refills | Status: DC
  Filled 2023-07-07: qty 30, 30d supply, fill #0

## 2023-07-07 NOTE — Telephone Encounter (Signed)
 Requested medication (s) are due for refill today: expired medication  Requested medication (s) are on the active medication list: yes   Last refill:  06/23/22 #90 3 refills   Future visit scheduled: yes 11/04/23  Notes to clinic:  expired medication date. Do you want to renew Rx or give enough refills until 11/04/23?     Requested Prescriptions  Pending Prescriptions Disp Refills   rosuvastatin  (CRESTOR ) 5 MG tablet 90 tablet 3    Sig: Take 1 tablet (5 mg total) by mouth daily.     Cardiovascular:  Antilipid - Statins 2 Failed - 07/07/2023 11:43 AM      Failed - Valid encounter within last 12 months    Recent Outpatient Visits           2 months ago Encounter for general adult medical examination with abnormal findings   [facility] [facility] [place], [place] W, NP              Failed - Lipid Panel in normal range within the last 12 months    Cholesterol, Total  Date Value Ref Range Status  11/28/2020 112 100 - 199 mg/dL Final   Cholesterol  Date Value Ref Range Status  05/03/2023 152 <200 mg/dL Final   LDL Cholesterol (Calc)  Date Value Ref Range Status  05/03/2023 64 mg/dL (calc) Final    Comment:    Reference range: <100 . Desirable range <100 mg/dL for primary prevention;   <70 mg/dL for patients with CHD or diabetic patients  with > or = 2 CHD risk factors. Aaron Aas LDL-C is now calculated using the Martin-Hopkins  calculation, which is a validated novel method providing  better accuracy than the Friedewald equation in the  estimation of LDL-C.  Melinda Sprawls et al. Erroll Heard. 0865;784(69): 2061-2068  (http://education.[employer].com/faq/FAQ164)    HDL  Date Value Ref Range Status  05/03/2023 72 > OR = 40 mg/dL Final  62/95/2841 60 >32 mg/dL Final   Triglycerides  Date Value Ref Range Status  05/03/2023 75 <150 mg/dL Final         Passed - Cr in normal range and within 360 days    Creat  Date Value Ref Range Status  05/03/2023 1.10 0.70 -  1.30 mg/dL Final   Creatinine, Urine  Date Value Ref Range Status  10/15/2022 101 20 - 320 mg/dL Final         Passed - Patient is not pregnant

## 2023-07-07 NOTE — Telephone Encounter (Signed)
 Requested medication (s) are due for refill today-yes   Requested medication (s) are on the active medication list -yes  Future visit scheduled -yes  Last refill: 06/07/23 #30 for both  Notes to clinic: non delegated Rx- provider review   Requested Prescriptions  Pending Prescriptions Disp Refills   zolpidem  (AMBIEN  CR) 12.5 MG CR tablet 30 tablet 0    Sig: Take 1 tablet (12.5 mg total) by mouth at bedtime.     Not Delegated - Psychiatry:  Anxiolytics/Hypnotics Failed - 07/07/2023 11:30 AM      Failed - This refill cannot be delegated      Failed - Urine Drug Screen completed in last 360 days      Failed - Valid encounter within last 6 months    Recent Outpatient Visits           2 months ago Encounter for general adult medical examination with abnormal findings   [facility] [place], [place] W, NP               clonazePAM  (KLONOPIN ) 0.5 MG tablet 30 tablet 0    Sig: Take 1 tablet (0.5 mg total) by mouth at bedtime.     Not Delegated - Psychiatry: Anxiolytics/Hypnotics 2 Failed - 07/07/2023 11:30 AM      Failed - This refill cannot be delegated      Failed - Urine Drug Screen completed in last 360 days      Failed - Valid encounter within last 6 months    Recent Outpatient Visits           2 months ago Encounter for general adult medical examination with abnormal findings   [facility] [place], [place], [place]              Passed - Patient is not pregnant         Requested Prescriptions  Pending Prescriptions Disp Refills   zolpidem  (AMBIEN  CR) 12.5 MG CR tablet 30 tablet 0    Sig: Take 1 tablet (12.5 mg total) by mouth at bedtime.     Not Delegated - Psychiatry:  Anxiolytics/Hypnotics Failed - 07/07/2023 11:30 AM      Failed - This refill cannot be delegated      Failed - Urine Drug Screen completed in last 360 days      Failed - Valid encounter within last 6 months    Recent Outpatient Visits            2 months ago Encounter for general adult medical examination with abnormal findings   [facility] [place], [place] W, NP               clonazePAM  (KLONOPIN ) 0.5 MG tablet 30 tablet 0    Sig: Take 1 tablet (0.5 mg total) by mouth at bedtime.     Not Delegated - Psychiatry: Anxiolytics/Hypnotics 2 Failed - 07/07/2023 11:30 AM      Failed - This refill cannot be delegated      Failed - Urine Drug Screen completed in last 360 days      Failed - Valid encounter within last 6 months    Recent Outpatient Visits           2 months ago Encounter for general adult medical examination with abnormal findings   [facility] [place], [place] Buzz[place], NP  Passed - Patient is not pregnant

## 2023-07-14 ENCOUNTER — Other Ambulatory Visit: Payer: Self-pay | Admitting: Internal Medicine

## 2023-07-14 ENCOUNTER — Other Ambulatory Visit: Payer: Self-pay

## 2023-07-15 ENCOUNTER — Other Ambulatory Visit: Payer: Self-pay

## 2023-07-15 MED ORDER — ALBUTEROL SULFATE HFA 108 (90 BASE) MCG/ACT IN AERS
1.0000 | INHALATION_SPRAY | RESPIRATORY_TRACT | 2 refills | Status: AC | PRN
  Filled 2023-07-15: qty 6.7, 17d supply, fill #0
  Filled 2023-12-07: qty 6.7, 17d supply, fill #1

## 2023-07-15 MED ORDER — CARVEDILOL 3.125 MG PO TABS
3.1250 mg | ORAL_TABLET | Freq: Two times a day (BID) | ORAL | 0 refills | Status: DC
  Filled 2023-07-15: qty 180, 90d supply, fill #0

## 2023-07-15 MED ORDER — CYCLOBENZAPRINE HCL 10 MG PO TABS
10.0000 mg | ORAL_TABLET | Freq: Two times a day (BID) | ORAL | 0 refills | Status: DC | PRN
  Filled 2023-07-15: qty 60, 30d supply, fill #0

## 2023-07-15 NOTE — Telephone Encounter (Signed)
 Requested Prescriptions  Pending Prescriptions Disp Refills   carvedilol  (COREG ) 3.125 MG tablet 180 tablet 0    Sig: Take 1 tablet (3.125 mg total) by mouth 2 (two) times daily with a meal.     Cardiovascular: Beta Blockers 3 Failed - 07/15/2023  2:28 PM      Failed - Valid encounter within last 6 months    Recent Outpatient Visits           2 months ago Encounter for general adult medical examination with abnormal findings   [facility] [place], [place] W, NP              Passed - Cr in normal range and within 360 days    Creat  Date Value Ref Range Status  05/03/2023 1.10 0.70 - 1.30 mg/dL Final   Creatinine, Urine  Date Value Ref Range Status  10/15/2022 101 20 - 320 mg/dL Final         Passed - AST in normal range and within 360 days    AST  Date Value Ref Range Status  05/03/2023 23 10 - 35 U/L Final         Passed - ALT in normal range and within 360 days    ALT  Date Value Ref Range Status  05/03/2023 25 9 - 46 U/L Final         Passed - Last BP in normal range    BP Readings from Last 1 Encounters:  05/03/23 122/70         Passed - Last Heart Rate in normal range    Pulse Readings from Last 1 Encounters:  03/08/23 76          albuterol  (VENTOLIN  HFA) 108 (90 Base) MCG/ACT inhaler 6.7 g 2    Sig: Inhale 1-2 puffs into the lungs every 4 (four) hours as needed for wheezing or shortness of breath     Pulmonology:  Beta Agonists 2 Failed - 07/15/2023  2:28 PM      Failed - Valid encounter within last 12 months    Recent Outpatient Visits           2 months ago Encounter for general adult medical examination with abnormal findings   [place] [facility] [place], [place] W, NP              Passed - Last BP in normal range    BP Readings from Last 1 Encounters:  05/03/23 122/70         Passed - Last Heart Rate in normal range    Pulse Readings from Last 1 Encounters:  03/08/23 76

## 2023-07-15 NOTE — Telephone Encounter (Signed)
 Requested medications are due for refill today.  yes  Requested medications are on the active medications list.  yes  Last refill. 06/16/2023 #60 0 rf  Future visit scheduled.   yes  Notes to clinic.  Refill not delegated.    Requested Prescriptions  Pending Prescriptions Disp Refills   cyclobenzaprine  (FLEXERIL ) 10 MG tablet 60 tablet 0    Sig: Take 1 tablet (10 mg total) by mouth 2 (two) times daily as needed.     Not Delegated - Analgesics:  Muscle Relaxants Failed - 07/15/2023  2:26 PM      Failed - This refill cannot be delegated      Failed - Valid encounter within last 6 months    Recent Outpatient Visits           2 months ago Encounter for general adult medical examination with abnormal findings   [facility] [place], [place], NP

## 2023-07-19 ENCOUNTER — Other Ambulatory Visit: Payer: Self-pay

## 2023-07-29 ENCOUNTER — Other Ambulatory Visit: Payer: Self-pay | Admitting: Internal Medicine

## 2023-07-29 DIAGNOSIS — K219 Gastro-esophageal reflux disease without esophagitis: Secondary | ICD-10-CM

## 2023-07-30 ENCOUNTER — Other Ambulatory Visit: Payer: Self-pay

## 2023-07-30 MED ORDER — PANTOPRAZOLE SODIUM 40 MG PO TBEC
40.0000 mg | DELAYED_RELEASE_TABLET | Freq: Two times a day (BID) | ORAL | 0 refills | Status: DC
Start: 2023-07-30 — End: 2023-11-04
  Filled 2023-07-30: qty 180, 90d supply, fill #0

## 2023-07-30 NOTE — Telephone Encounter (Signed)
 Requested Prescriptions  Pending Prescriptions Disp Refills   pantoprazole  (PROTONIX ) 40 MG tablet 180 tablet 0    Sig: Take 1 tablet (40 mg total) by mouth 2 (two) times daily.     Gastroenterology: Proton Pump Inhibitors Failed - 07/30/2023 12:28 PM      Failed - Valid encounter within last 12 months    Recent Outpatient Visits           2 months ago Encounter for general adult medical examination with abnormal findings   [facility] [place], [place]N, NP

## 2023-08-04 ENCOUNTER — Other Ambulatory Visit: Payer: Self-pay

## 2023-08-04 ENCOUNTER — Other Ambulatory Visit: Payer: Self-pay | Admitting: Internal Medicine

## 2023-08-04 DIAGNOSIS — F5101 Primary insomnia: Secondary | ICD-10-CM

## 2023-08-04 DIAGNOSIS — F419 Anxiety disorder, unspecified: Secondary | ICD-10-CM

## 2023-08-06 NOTE — Telephone Encounter (Signed)
 Requested medication (s) are due for refill today: Yes  Requested medication (s) are on the active medication list: Yes  Last refill:  07/07/23  Future visit scheduled: Yes  Notes to clinic:  Unable to refill per protocol, cannot delegate.      Requested Prescriptions  Pending Prescriptions Disp Refills   zolpidem  (AMBIEN  CR) 12.5 MG CR tablet 30 tablet 0    Sig: Take 1 tablet (12.5 mg total) by mouth at bedtime.     Not Delegated - Psychiatry:  Anxiolytics/Hypnotics Failed - 08/06/2023 12:49 PM      Failed - This refill cannot be delegated      Failed - Urine Drug Screen completed in last 360 days      Passed - Valid encounter within last 6 months    Recent Outpatient Visits           3 months ago Encounter for general adult medical examination with abnormal findings   [facility] [place], [place] W, NP               clonazePAM  (KLONOPIN ) 0.5 MG tablet 30 tablet 0    Sig: Take 1 tablet (0.5 mg total) by mouth at bedtime.     Not Delegated - Psychiatry: Anxiolytics/Hypnotics 2 Failed - 08/06/2023 12:49 PM      Failed - This refill cannot be delegated      Failed - Urine Drug Screen completed in last 360 days      Passed - Patient is not pregnant      Passed - Valid encounter within last 6 months    Recent Outpatient Visits           3 months ago Encounter for general adult medical examination with abnormal findings    [facility] [place], Ang[place] ORN, NP

## 2023-08-09 ENCOUNTER — Encounter: Payer: Self-pay | Admitting: Internal Medicine

## 2023-08-09 ENCOUNTER — Other Ambulatory Visit: Payer: Self-pay

## 2023-08-09 MED ORDER — ZOLPIDEM TARTRATE ER 12.5 MG PO TBCR
12.5000 mg | EXTENDED_RELEASE_TABLET | Freq: Every day | ORAL | 0 refills | Status: DC
  Filled 2023-08-09: qty 30, 30d supply, fill #0

## 2023-08-09 MED ORDER — CLONAZEPAM 0.5 MG PO TABS
0.5000 mg | ORAL_TABLET | Freq: Every day | ORAL | 0 refills | Status: DC
Start: 2023-08-09 — End: 2023-09-08
  Filled 2023-08-09: qty 30, 30d supply, fill #0

## 2023-08-17 ENCOUNTER — Other Ambulatory Visit: Payer: Self-pay | Admitting: Internal Medicine

## 2023-08-18 ENCOUNTER — Other Ambulatory Visit: Payer: Self-pay

## 2023-08-18 MED ORDER — CYCLOBENZAPRINE HCL 10 MG PO TABS
10.0000 mg | ORAL_TABLET | Freq: Two times a day (BID) | ORAL | 0 refills | Status: DC | PRN
  Filled 2023-08-18: qty 60, 30d supply, fill #0

## 2023-08-18 NOTE — Telephone Encounter (Signed)
 Requested medication (s) are due for refill today: yes  Requested medication (s) are on the active medication list: yes  Last refill:  07/15/23 #60  Future visit scheduled: yes  Notes to clinic:  med not delegated to NT to RF   Requested Prescriptions  Pending Prescriptions Disp Refills   cyclobenzaprine  (FLEXERIL ) 10 MG tablet 60 tablet 0    Sig: Take 1 tablet (10 mg total) by mouth 2 (two) times daily as needed.     Not Delegated - Analgesics:  Muscle Relaxants Failed - 08/18/2023  1:28 PM      Failed - This refill cannot be delegated      Passed - Valid encounter within last 6 months    Recent Outpatient Visits           3 months ago Encounter for general adult medical examination with abnormal findings   [place] [facility] [place], Angeline ORN, NP

## 2023-09-03 NOTE — Progress Notes (Signed)
 NEUROLOGY FOLLOW UP OFFICE NOTE  [name] [id number]  Assessment/Plan:     1.  Headache, stable 2.  Upper extremity paresthesias - History of cervical spinal stenosis s/p surgery and carpal tunnel syndrome s/p surgery; no improvement with carpal tunnel release or discontinuing Trokendi .  May be residual from cervical myelopathy or possibly bilateral C6-7 radiculopathy.   3.  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  To treat paresthesias/dysesthesias, increase gabapentin  to 800mg  three times daily 2.  Refer to PT for upper back pain 3.  Follow up one year   Subjective:  [name] is a 53 year old man with unstable angina and history of stroke who follows up for headaches and upper extremity numbness and tingling.     UPDATE: Paresthesias/cervical spinal stenosis, carpal tunnel syndrome:  Discontinued Trokendi  and increased gabapentin  to 600mg  TID.  No change.  Still has the tingling in the hands.  Upper back pain: He has upper back pain, particularly between the shoulder blades, as well as tingling across the upper back.  Bending over, he feels a crack.    Headaches: Off Trokendi  but had increased gabapentin . Headaches remain controlled.  No recent headaches.    HISTORY: Cervical spinal stenosis: In 2020, he was found to have severe cervical spinal stenosis, presenting with numbness, tingling and aching/soreness of arms and legs as well as paresthesias and tightness sensation involves the left side of arm and torso and a little bit in the leg.  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 He was referred to neurosurgery and underwent C3-C7 ACDF on 04/08/18. Residual pain has been maintained on gabapentin .  In 2019, he began  experiencing pain, numbness and tingling in the hands.  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  He underwent bilateral carpal tunnel release a couple of years ago.   However, he has not had any improvement.  Repeat NCV-EMG on 09/04/2021 iewed demonstrated improvement with improvement of median neuropathy on the left and no evidence of carpal tunnel syndrome on the right.  Interval development of chronic mild right C5-6 radiculopathy now noted.  Mild left ulnar neuropathy across elbow stable.  Due to ongoing upper extremity pain and numbness despite carpal tunnel surgery, MRI of C-spine with and without contrast was performed on 04/16/2022, which revealed post surgical changes of C3-7 fusion with patent central canal, mild to moderate foraminal narrowing at C6-7 (more notable on left) and small focus of chronic myelomalacia in the right hemicord at approximately C5-6.   Headache: Since his stroke in September 2019, he continued to have a constant severe right-sided pressure-like headache involving the right temple and right side of face associated with right sided facial numbness and tingling.  He also noted worsening slurred speech, right upper extremity numbness and weakness.  He developed new blurred peripheral vision in his right eye.  He also endorsed postural lightheadedness when standing but not vertigo.  He presented to the ED on 11/06/17 for further evaluation.  Labs were unremarkable.  CT of head demonstrated no acute findings.  MRI of brain was normal with resolution of prior punctate focus.  He was treated with a migraine cocktail and discharged.  Past medication:  Nortriptyline  (helpful but  discontinued due to possibly contributing to elevated blood pressure)    PAST MEDICAL HISTORY: Past Medical History:  Diagnosis Date   Anxiety 2018   takes Xanax  daily prn, panic attacks   Chronic gastric ulcer with  bleeding s/p suture repair 2012 09/29/2010   Head 3 EGD which failed to stop the bleeding. Had exploratory laparotomy on 10/04/2010. Possible cause of the gastric ulcer was NSAID use.    Clotting disorder (HCC) 2012   Constipation    related to pain meds   Depression    Diabetes mellitus without complication (HCC)    Dizziness    occasionally   Dyspnea    walking a bit   Erosive esophagitis    External hemorrhoid, bleeding    Gastric ulcer    history of   GERD (gastroesophageal reflux disease) 2012   takes Protonix  bid   Hiatal hernia    History of blood clots 2012   in abdomen   History of blood transfusion 2012   Hyperlipidemia    Hypertension 2021   Incisional hernia s/p open repair w mesh Aug 2013 05/26/2011   Joint pain    knees   Nocturia    depends on amount of fluid he drinks   Pneumonia 2018   Pre-diabetes    Primary localized osteoarthritis of left hip 04/24/2014   Serrated polyp of colon    Sleep apnea 2016   no cpap  mild no cpap needed   Stroke [facility]) 2019   Unstable angina (HCC) 04/09/2020   Upper GI bleed    Ventral hernia     MEDICATIONS: Current Outpatient Medications on File Prior to Visit  Medication Sig Dispense Refill   albuterol  (VENTOLIN  HFA) 108 (90 Base) MCG/ACT inhaler Inhale 1-2 puffs into the lungs every 4 (four) hours as needed for wheezing or shortness of breath 6.7 g 2   amLODipine  (NORVASC ) 10 MG tablet Take 1 tablet (10 mg total) by mouth daily. Please call our to schedule an overdue appointment with Dr. Hobart before anymore refills. [phone]. Thank you. 3rd attempt. 90 tablet 0   aspirin  EC 81 MG EC tablet Take 1 tablet (81 mg total) by mouth daily.     carvedilol  (COREG ) 3.125 MG tablet Take 1 tablet (3.125 mg total) by mouth 2 (two) times daily with a meal. 180 tablet 0   clonazePAM  (KLONOPIN ) 0.5 MG tablet Take 1 tablet (0.5 mg total) by mouth at bedtime. 30 tablet 0   cyclobenzaprine  (FLEXERIL ) 10 MG tablet Take 1 tablet (10  mg total) by mouth 2 (two) times daily as needed. 60 tablet 0   fluticasone  (FLONASE ) 50 MCG/ACT nasal spray Place 2 sprays into both nostrils daily as needed for allergies. 16 g 11   gabapentin  (NEURONTIN ) 300 MG capsule Take 2 capsules (600 mg total) by mouth 3 (three) times daily. 180 capsule 5   ipratropium (ATROVENT ) 0.03 % nasal spray Place 2 sprays into both nostrils every 12 (twelve) hours. 30 mL 0   lisinopril  (ZESTRIL ) 10 MG tablet Take 1 tablet (10 mg total) by mouth daily. 90 tablet 1   ondansetron  (ZOFRAN ) 4 MG tablet Take 1 tablet (4 mg total) by mouth every 8 (eight) hours as needed for nausea or vomiting. 20 tablet 1   pantoprazole  (PROTONIX ) 40 MG tablet Take 1 tablet (40 mg total) by mouth 2 (two) times daily. 180 tablet 0   rosuvastatin  (CRESTOR ) 5 MG tablet Take 1 tablet (5 mg total) by mouth daily. 90 tablet 3  zolpidem  (AMBIEN  CR) 12.5 MG CR tablet Take 1 tablet (12.5 mg total) by mouth at bedtime. 30 tablet 0   [DISCONTINUED] nortriptyline  (PAMELOR ) 25 MG capsule TAKE 1 CAPSULE BY MOUTH AT BEDTIME 30 capsule 0   [DISCONTINUED] simvastatin  (ZOCOR ) 10 MG tablet Take 1 tablet (10 mg total) by mouth daily. 90 tablet 2   No current facility-administered medications on file prior to visit.    ALLERGIES: Allergies  Allergen Reactions   Dilaudid  [Hydromorphone  Hcl] Rash and Other (See Comments)    Shakey   Hydromorphone  Hcl Other (See Comments) and Rash    Shakey   Metoclopramide  Other (See Comments), Rash, Swelling and Hives    Swelling in lips   Metoclopramide  Hcl Hives, Swelling, Rash and Other (See Comments)    Swelling in lips   Nsaids     H/o bleeding gastric ulcers   Benadryl  [Diphenhydramine ]     Causes patient to become hyper and pace    FAMILY HISTORY: Family History  Problem Relation Age of Onset   Diabetes Mother    Liver disease Mother    Diabetes Father    Heart disease Father    Healthy Sister    Healthy Brother    Healthy Brother    Colon  cancer Neg Hx    Stomach cancer Neg Hx    Rectal cancer Neg Hx    Esophageal cancer Neg Hx    Colon polyps Neg Hx    Prostate cancer Neg Hx       Objective:  Blood pressure 129/88, pulse 64, height 5' 9 (1.753 m), weight 229 lb (103.9 kg), SpO2 97%. General: No acute distress.  Patient appears well-groomed.       Juliene Dunnings, DO  CC: Angeline Laura, NP

## 2023-09-06 ENCOUNTER — Ambulatory Visit: Payer: Commercial Managed Care - PPO | Admitting: Neurology

## 2023-09-06 ENCOUNTER — Other Ambulatory Visit: Payer: Self-pay

## 2023-09-06 ENCOUNTER — Encounter: Payer: Self-pay | Admitting: Neurology

## 2023-09-06 VITALS — BP 129/88 | HR 64 | Ht 69.0 in | Wt 229.0 lb

## 2023-09-06 DIAGNOSIS — M549 Dorsalgia, unspecified: Secondary | ICD-10-CM

## 2023-09-06 MED ORDER — GABAPENTIN 800 MG PO TABS
800.0000 mg | ORAL_TABLET | Freq: Three times a day (TID) | ORAL | 5 refills | Status: AC
  Filled 2023-09-06: qty 90, 30d supply, fill #0
  Filled 2023-10-06: qty 90, 30d supply, fill #1
  Filled 2023-11-09: qty 90, 30d supply, fill #2
  Filled 2023-12-10: qty 90, 30d supply, fill #3
  Filled 2024-01-11: qty 90, 30d supply, fill #4
  Filled 2024-02-14: qty 90, 30d supply, fill #5

## 2023-09-08 ENCOUNTER — Other Ambulatory Visit: Payer: Self-pay | Admitting: Family Medicine

## 2023-09-08 ENCOUNTER — Other Ambulatory Visit: Payer: Self-pay | Admitting: Internal Medicine

## 2023-09-08 DIAGNOSIS — F419 Anxiety disorder, unspecified: Secondary | ICD-10-CM

## 2023-09-08 DIAGNOSIS — F5101 Primary insomnia: Secondary | ICD-10-CM

## 2023-09-10 ENCOUNTER — Other Ambulatory Visit: Payer: Self-pay

## 2023-09-10 MED ORDER — AMLODIPINE BESYLATE 10 MG PO TABS
10.0000 mg | ORAL_TABLET | Freq: Every day | ORAL | 0 refills | Status: DC
  Filled 2023-09-10: qty 90, 90d supply, fill #0

## 2023-09-10 MED ORDER — CLONAZEPAM 0.5 MG PO TABS
0.5000 mg | ORAL_TABLET | Freq: Every day | ORAL | 0 refills | Status: DC
Start: 2023-09-10 — End: 2023-10-08
  Filled 2023-09-10: qty 30, 30d supply, fill #0

## 2023-09-10 MED ORDER — ZOLPIDEM TARTRATE ER 12.5 MG PO TBCR
12.5000 mg | EXTENDED_RELEASE_TABLET | Freq: Every day | ORAL | 0 refills | Status: DC
  Filled 2023-09-10: qty 30, 30d supply, fill #0

## 2023-09-10 NOTE — Telephone Encounter (Signed)
 Requested medication (s) are due for refill today: yes  Requested medication (s) are on the active medication list: yes  Last refill:  08/09/23  Future visit scheduled: {Yes  Notes to clinic:  Unable to refill per protocol, cannot delegate.      Requested Prescriptions  Pending Prescriptions Disp Refills   zolpidem  (AMBIEN  CR) 12.5 MG CR tablet 30 tablet 0    Sig: Take 1 tablet (12.5 mg total) by mouth at bedtime.     Not Delegated - Psychiatry:  Anxiolytics/Hypnotics Failed - 09/10/2023 11:04 AM      Failed - This refill cannot be delegated      Failed - Urine Drug Screen completed in last 360 days      Passed - Valid encounter within last 6 months    Recent Outpatient Visits           4 months ago Encounter for general adult medical examination with abnormal findings   [place] [facility] [place], [place] W, NP               clonazePAM  (KLONOPIN ) 0.5 MG tablet 30 tablet 0    Sig: Take 1 tablet (0.5 mg total) by mouth at bedtime.     Not Delegated - Psychiatry: Anxiolytics/Hypnotics 2 Failed - 09/10/2023 11:04 AM      Failed - This refill cannot be delegated      Failed - Urine Drug Screen completed in last 360 days      Passed - Patient is not pregnant      Passed - Valid encounter within last 6 months    Recent Outpatient Visits           4 months ago Encounter for general adult medical examination with abnormal findings   [place] [facility] [place], Angeline ORN, NP

## 2023-09-10 NOTE — Telephone Encounter (Signed)
 Requested Prescriptions  Pending Prescriptions Disp Refills   amLODipine  (NORVASC ) 10 MG tablet 90 tablet 0    Sig: Take 1 tablet (10 mg total) by mouth daily. Please call our to schedule an overdue appointment with Dr. Hobart before anymore refills. [phone]. Thank you. 3rd attempt.     Cardiovascular: Calcium  Channel Blockers 2 Passed - 09/10/2023 10:58 AM      Passed - Last BP in normal range    BP Readings from Last 1 Encounters:  09/06/23 129/88         Passed - Last Heart Rate in normal range    Pulse Readings from Last 1 Encounters:  09/06/23 64         Passed - Valid encounter within last 6 months    Recent Outpatient Visits           4 months ago Encounter for general adult medical examination with abnormal findings   [place] [facility] [place], Angeline ORN, NP

## 2023-09-27 ENCOUNTER — Other Ambulatory Visit: Payer: Self-pay | Admitting: Internal Medicine

## 2023-09-28 ENCOUNTER — Other Ambulatory Visit: Payer: Self-pay

## 2023-09-28 MED ORDER — CYCLOBENZAPRINE HCL 10 MG PO TABS
10.0000 mg | ORAL_TABLET | Freq: Two times a day (BID) | ORAL | 0 refills | Status: DC | PRN
  Filled 2023-09-28: qty 60, 30d supply, fill #0

## 2023-09-28 NOTE — Telephone Encounter (Signed)
 Requested medication (s) are due for refill today:   Provider to review  Requested medication (s) are on the active medication list:   Yes  Future visit scheduled:   Yes 10/2   Last ordered: 08/18/2023 #60, 0 refills  Non delegated   Requested Prescriptions  Pending Prescriptions Disp Refills   cyclobenzaprine  (FLEXERIL ) 10 MG tablet 60 tablet 0    Sig: Take 1 tablet (10 mg total) by mouth 2 (two) times daily as needed.     Not Delegated - Analgesics:  Muscle Relaxants Failed - 09/28/2023  1:40 PM      Failed - This refill cannot be delegated      Passed - Valid encounter within last 6 months    Recent Outpatient Visits           4 months ago Encounter for general adult medical examination with abnormal findings   [place] [facility] [place], Angeline ORN, NP

## 2023-09-29 ENCOUNTER — Other Ambulatory Visit: Payer: Self-pay

## 2023-09-29 ENCOUNTER — Encounter: Payer: Self-pay | Admitting: Physical Therapy

## 2023-09-29 ENCOUNTER — Ambulatory Visit: Attending: Neurology | Admitting: Physical Therapy

## 2023-09-29 VITALS — BP 134/82 | HR 84

## 2023-09-29 DIAGNOSIS — M5413 Radiculopathy, cervicothoracic region: Secondary | ICD-10-CM | POA: Diagnosis not present

## 2023-09-29 DIAGNOSIS — M542 Cervicalgia: Secondary | ICD-10-CM | POA: Insufficient documentation

## 2023-09-29 DIAGNOSIS — M549 Dorsalgia, unspecified: Secondary | ICD-10-CM | POA: Insufficient documentation

## 2023-09-29 DIAGNOSIS — M546 Pain in thoracic spine: Secondary | ICD-10-CM | POA: Insufficient documentation

## 2023-09-29 NOTE — Therapy (Unsigned)
 OUTPATIENT PHYSICAL THERAPY THORACOLUMBAR EVALUATION   Patient Name: [name] MRN: [id number] DOB:03/19/1970, 53 y.o., male Today's Date: 09/29/2023  END OF SESSION:  [name] End of Session - 09/29/23 0811     Visit Number 1    Number of Visits 9   8 + eval   Date for [name] Re-Evaluation 12/10/23   pushed out due to delay in scheduling   Authorization Type [name]    [name] Start Time [id number]    [name] Stop Time 0850    [name] Time Calculation (min) 46 min    Activity Tolerance Patient tolerated treatment well    Behavior During Therapy [name] for tasks assessed/performed          Past Medical History:  Diagnosis Date   Anxiety 2018   takes Xanax  daily prn, panic attacks   Chronic gastric ulcer with bleeding s/p suture repair 2012 09/29/2010   Head 3 EGD which failed to stop the bleeding. Had exploratory laparotomy on 10/04/2010. Possible cause of the gastric ulcer was NSAID use.    Clotting disorder (HCC) 2012   Constipation    related to pain meds   Depression    Diabetes mellitus without complication (HCC)    Dizziness    occasionally   Dyspnea    walking a bit   Erosive esophagitis    External hemorrhoid, bleeding    Gastric ulcer    history of   GERD (gastroesophageal reflux disease) 2012   takes Protonix  bid   Hiatal hernia    History of blood clots 2012   in abdomen   History of blood transfusion 2012   Hyperlipidemia    Hypertension 2021   Incisional hernia s/p open repair w mesh Aug 2013 05/26/2011   Joint pain    knees   Nocturia    depends on amount of fluid he drinks   Pneumonia 2018   Pre-diabetes    Primary localized osteoarthritis of left hip 04/24/2014   Serrated polyp of colon    Sleep apnea 2016   no cpap  mild no cpap needed   Stroke (HCC) 2019   Unstable angina (HCC) 04/09/2020   Upper GI bleed    Ventral hernia    Past Surgical History:  Procedure Laterality Date   ANTERIOR CERVICAL DECOMPRESSION/DISCECTOMY FUSION 4 LEVELS N/A 04/08/2018    Procedure: ACDF - C3-C4 - C4-C5 - C5-C6 - C6-C7;  Surgeon: Bruce Kuba, MD;  Location: [facility] OR;  Service: Neurosurgery;  Laterality: N/A;   CARPAL TUNNEL RELEASE Left 04/03/2018   COLONOSCOPY     COLONOSCOPY  07/15/2021   ESOPHAGEAL MANOMETRY N/A 10/19/2016   Procedure: ESOPHAGEAL MANOMETRY (EM);  Surgeon: Bruce Gustav GAILS, MD;  Location: [facility];  Service: Endoscopy;  Laterality: N/A;   ESOPHAGOGASTRODUODENOSCOPY     ESOPHAGOGASTRODUODENOSCOPY N/A 01/22/2017   Procedure: ESOPHAGOGASTRODUODENOSCOPY (EGD);  Surgeon: Bruce Standing, MD;  Location: [facility];  Service: General;  Laterality: N/A;   HERNIA REPAIR  2018   INCISIONAL HERNIA REPAIR  09/04/2011   Procedure: HERNIA REPAIR INCISIONAL;  Surgeon: Bruce Faith, DO;  Location: [facility] OR;  Service: General;  Laterality: N/A;  debridment calcified mass   INSERTION OF MESH  09/04/2011   retrorectus ultrapro 30x30cm   INSERTION OF MESH N/A 01/22/2017   Procedure: INSERTION OF MESH;  Surgeon: Bruce Standing, MD;  Location: [facility];  Service: General;  Laterality: N/A;   JOINT REPLACEMENT  2016   LEFT HEART CATH AND CORONARY ANGIOGRAPHY N/A 04/11/2020  Procedure: LEFT HEART CATH AND CORONARY ANGIOGRAPHY;  Surgeon: Bruce Dorn PARAS, MD;  Location: [facility];  Service: Cardiovascular;  Laterality: N/A;   NISSEN FUNDOPLICATION     Open 1990s   POLYPECTOMY     STOMACH SURGERY  10/05/2010   Oversewing of gastric ulcer.  Dr [name]   TOTAL HIP ARTHROPLASTY Left 04/24/2014   Procedure: LEFT TOTAL HIP ARTHROPLASTY;  Surgeon: Bruce Olmsted, MD;  Location: [facility] OR;  Service: Orthopedics;  Laterality: Left;   UPPER GASTROINTESTINAL ENDOSCOPY     Patient Active Problem List   Diagnosis Date Noted   Carpal tunnel syndrome 08/27/2020   Type 2 diabetes mellitus without complication, without long-term current use of insulin (HCC) 05/02/2020   Class 2 obesity due to excess calories with body mass index (BMI) of 35.0 to 35.9 in adult 05/01/2020    HTN (hypertension) 08/17/2019   Frequent headaches 05/15/2019   Hyperlipidemia associated with type 2 diabetes mellitus (HCC) 12/21/2018   History of CVA (cerebrovascular accident) without residual deficits 10/05/2017   Gastroesophageal reflux disease    Insomnia 05/30/2014   Anxiety 05/26/2011    PCP: Bruce Angeline ORN, NP  REFERRING PROVIDER: Skeet Juliene SAUNDERS, DO  REFERRING DIAG: M54.9 (ICD-10-CM) - Back pain, unspecified back location, unspecified back pain laterality, unspecified chronicity  Rationale for Evaluation and Treatment: Rehabilitation  THERAPY DIAG:  Cervicalgia  Pain in thoracic spine  Radiculopathy, cervicothoracic region  ONSET DATE: since 2020  SUBJECTIVE:                                                                                                                                                                                           SUBJECTIVE STATEMENT: [name] reports the pain in his upper back has been there since his CTS surgery in 2020 and just never went away after.  He states the N/T down both arms is just getting to be too much again.  He reports he is dropping things at work and having trouble with screws and other small parts, but I can deal with it.  He feels his strength is intact in both arms, just a lot of sensory changes.  PERTINENT HISTORY:  Anxiety, GERD, CVA, headaches, HTN, DM2, CTS s/p bilateral release 2020, left THA 2016, hernia repair x2 2013 & 2018, ACDF C3-C7 2020  PAIN:  Are you having pain? Yes: NPRS scale: 4-5 Pain location: Down bilateral arms Pain description: N/T, burning Aggravating factors: mornings, nights depending on activity level during the day Relieving factors: trying to stretch  PRECAUTIONS: Fall  RED FLAGS: None   WEIGHT BEARING RESTRICTIONS: No  FALLS:  Has patient fallen in last 6 months? Yes. Number of falls 2-3 - I'll just stumble and fall  LIVING ENVIRONMENT: Lives with: brother Lives  in: House/apartment Stairs: No Has following equipment at home: None  OCCUPATION: [place] [employer]  PLOF: Independent  PATIENT GOALS: To see if we can get to where it's hurting  NEXT MD VISIT: 11/04/2023 Bruce Angeline ORN, NP  OBJECTIVE:  Note: Objective measures were completed at Evaluation unless otherwise noted.  DIAGNOSTIC FINDINGS:  MRI Cervical 04/20/2022: IMPRESSION: 1. Status post C3-7 fusion. The central canal is open at the operated levels. Mild to moderate foraminal narrowing at C6-7 is more notable on the left. 2. Small focus of chronic myelomalacia in the right hemicord at approximately C5-6.  PATIENT SURVEYS:  Modified Oswestry:  MODIFIED OSWESTRY DISABILITY SCALE  Date: 09/29/2023 Score  Pain intensity 0 = I can tolerate the pain I have without having to use pain medication.  2. Personal care (washing, dressing, etc.) 0 =  I can take care of myself normally without causing increased pain.  3. Lifting 0 = I can lift heavy weights without increased pain.  4. Walking 0 = Pain does not prevent me from walking any distance  5. Sitting 2 =  Pain prevents me from sitting more than 1 hour.  6. [name] 0 =  I can stand as long as I want without increased pain.  7. Sleeping 1 = I can sleep well only by using pain medication.  8. Social Life 0 = My social life is normal and does not increase my pain.  9. Traveling 0 =  I can travel anywhere without increased pain.  10. Employment/ Homemaking 0 = My normal homemaking/job activities do not cause pain.  Total 3/50   Interpretation of scores: Score Category Description  0-20% Minimal Disability The patient can cope with most living activities. Usually no treatment is indicated apart from advice on lifting, sitting and exercise  21-40% Moderate Disability The patient experiences more pain and difficulty with sitting, lifting and [name]. Travel and social life are more difficult and they may be disabled from work.  Personal care, sexual activity and sleeping are not grossly affected, and the patient can usually be managed by conservative means  41-60% Severe Disability Pain remains the main problem in this group, but activities of daily living are affected. These patients require a detailed investigation  61-80% Crippled Back pain impinges on all aspects of the patient's life. Positive intervention is required  81-100% Bed-bound  These patients are either bed-bound or exaggerating their symptoms  Bluford FORBES Zoe DELENA Karon DELENA, et al. Surgery versus conservative management of stable thoracolumbar fracture: the PRESTO feasibility RCT. [place]): [employer]; 2021 Nov. [name] Technology Assessment, No. 25.62.) Appendix 3, Oswestry Disability Index category descriptors. Available from: FindJewel[employer].cz  Minimally Clinically Important Difference (MCID) = 12.8% NDI:  NECK DISABILITY INDEX  Date: 09/29/2023 Score  Pain intensity 2 = The pain is moderate at the moment  2. Personal care (washing, dressing, etc.) 0 = I can look after myself normally without causing extra pain  3. Lifting 0 =  I can lift heavy weights without extra pain  4. Reading 0 = I can read as much as I want to with no pain in my neck  5. Headaches 1 =  I have slight headaches, which come infrequently  6. Concentration 0 =  I can concentrate fully when I want to with no difficulty  7. Work 0 =  I can do as much work as I want to  8. Driving 1 =  I can drive my car as long as I want with slight pain in my neck  9. Sleeping 2 = My sleep is mildly disturbed (1-2 hrs sleepless)  10. Recreation 1 =  I am able to engage in all my recreation activities, with some pain in my neck  Total 7/50   Minimum Detectable Change (90% confidence): 5 points or 10% points  COGNITION: Overall cognitive status: Within functional limits for tasks assessed     SENSATION: Light touch: Impaired   POSTURE: rounded  shoulders, forward head, and posterior pelvic tilt  PALPATION: Bilateral T4 area - rhomboids and levator scapulae - noted stretched posture of musculature likely due to chronic rounding, possible left oriented rhomboid TP, mild kyphosis and flattening of cervical lordosis  THORACIC ROM:   AROM eval  Flexion WFL  Extension 25% loss; good muscle activation symmetrically  Right lateral flexion   Left lateral flexion   Right rotation   Left rotation    (Blank rows = not tested)  LOWER EXTREMITY ROM:     Active  Right eval Left eval  Hip flexion WNL WNL  Hip extension    Hip abduction    Hip adduction    Hip internal rotation    Hip external rotation    Knee flexion    Knee extension    Ankle dorsiflexion    Ankle plantarflexion    Ankle inversion    Ankle eversion     (Blank rows = not tested)  LOWER EXTREMITY MMT:    MMT Right eval Left eval  Hip flexion 5 5  Hip extension    Hip abduction 5 5  Hip adduction    Hip internal rotation    Hip external rotation    Knee flexion 5 5  Knee extension 5 5  Ankle dorsiflexion 5 5  Ankle plantarflexion 5 5  Ankle inversion    Ankle eversion     (Blank rows = not tested)  LUMBAR SPECIAL TESTS:  None relevant to chief complaint.  FUNCTIONAL TESTS:  5 times sit to stand: 11.56 sec no UE support  GAIT: Distance walked: various clinic distances Assistive device utilized: None Level of assistance: Complete Independence Comments: No overt instability, mildly dampened arm swing bilaterally.  TREATMENT DATE: 09/29/2023                                                                                                                                 PATIENT EDUCATION:  Education details: [name] POC, assessments used and goals to be set.  Discussed OT referral for fine motor deficits w/ [name] in agreement to pursue.  Initial HEP.  Discussed median nerve route and brief anatomy as it relates to central vs peripheral  nerves. Person educated: Patient Education method: Explanation, Demonstration, Verbal cues, and Handouts Education comprehension: verbalized understanding, returned demonstration, verbal  cues required, and needs further education  HOME EXERCISE PROGRAM: Access Code: [name] URL: [email or web address] Date: 09/29/2023 Prepared by: Daved Bull  Exercises - [name] Median Nerve Glide  - 1 x daily - 5 x weekly - 2 sets - 10 reps - Shoulder External Rotation and Scapular Retraction with Resistance  - 1 x daily - 5 x weekly - 3 sets - 10 reps  ASSESSMENT:  CLINICAL IMPRESSION: Patient is a 53 y.o. male who was seen today for physical therapy evaluation and treatment for cervicalgia and thoracic back pain with radicular component.  [name] has a significant PMH of anxiety, GERD, CVA, headaches, HTN, DM2, CTS s/p bilateral release 2020, left THA 2016, hernia repair x2 2013 & 2018, and ACDF C3-C7 2020.  Identified impairments include nerve pain down BUE in primarily median distribution, left rhomboid TP and TTP bilateral periscapular areas, and chronic postural changes.  He retains his strength with some change in arm swing pattern during ambulation.  He would benefit from skilled [name] to address impairments as noted and progress towards long term goals.  OBJECTIVE IMPAIRMENTS: decreased activity tolerance, decreased mobility, hypomobility, impaired flexibility, impaired sensation, improper body mechanics, postural dysfunction, and pain.   ACTIVITY LIMITATIONS: carrying, lifting, and reach over head  PARTICIPATION LIMITATIONS: meal prep, cleaning, laundry, and occupation  PERSONAL FACTORS: Past/current experiences, Profession, Time since onset of injury/illness/exacerbation, and 1-2 comorbidities: CTS s/p release and ACDF C3-C7 are also affecting patient's functional outcome.   REHAB POTENTIAL: Good  CLINICAL DECISION MAKING: Evolving/moderate complexity  EVALUATION COMPLEXITY:  Moderate   GOALS: Goals reviewed with patient? Yes  SHORT TERM GOALS: Target date: 10/29/2023  [name] will be independent and compliant with strength, stretching, and mobilization focused HEP in order to maintain functional progress and improve mobility. Baseline:  Initiated on eval Goal status: INITIAL  LONG TERM GOALS: Target date: 11/26/2023  [name] will be independent and compliant with advanced and finalized strength, stretching, and mobilization focused HEP in order to maintain functional progress and improve mobility. Baseline: Initiated on eval Goal status: INITIAL  2.  NDI will improve to </=2/50 in order to demonstrate improved pain management and quality of life. Baseline: 7/50 Goal status: INITIAL  3.  [name] will report average N/T over recent 7 days as </=2-3 in order to demonstrate improved symptom management. Baseline: 4-5 down BUE Goal status: INITIAL  PLAN:  [name] FREQUENCY: 1x/week  [name] DURATION: 8 weeks  PLANNED INTERVENTIONS: [id number]- [name] Re-evaluation, 97750- Physical Performance Testing, 97110-Therapeutic exercises, 97530- Therapeutic activity, [id number]- Neuromuscular re-education, 97535- Self Care, [id number]- Manual therapy, [phone]- Electrical stimulation (manual), Patient/Family education, Joint mobilization, Spinal mobilization, Scar mobilization, Cryotherapy, and Moist heat.  PLAN FOR NEXT SESSION: Expand HEP.  thoracic mobilizations, STM/IASTM to left rhomboid, periscapular strength, nerve glides - already provided median nerve in [name]   Daved KATHEE Bull, [name], DPT 09/29/2023, 8:52 AM

## 2023-09-29 NOTE — Patient Instructions (Signed)
 Access Code: [facility] URL: [email or web address] Date: 09/29/2023 Prepared by: Daved Bull  Exercises - Standing Median Nerve Glide  - 1 x daily - 5 x weekly - 2 sets - 10 reps - Shoulder External Rotation and Scapular Retraction with Resistance  - 1 x daily - 5 x weekly - 3 sets - 10 reps

## 2023-09-30 ENCOUNTER — Telehealth: Payer: Self-pay | Admitting: Physical Therapy

## 2023-09-30 DIAGNOSIS — R29898 Other symptoms and signs involving the musculoskeletal system: Secondary | ICD-10-CM

## 2023-09-30 NOTE — Telephone Encounter (Signed)
 Referral placed.

## 2023-09-30 NOTE — Addendum Note (Signed)
 Addended by: ANTONETTE ANGELINE ORN on: 09/30/2023 03:04 PM   Modules accepted: Orders

## 2023-09-30 NOTE — Telephone Encounter (Signed)
 Bruce Laura, NP,  [name]  was evaluated by PT on 09/29/2023.  The patient would benefit from OT evaluation for fine motor deficits related to radicular pain.   If you agree, please place an order in [facility] workque in [facility] or fax the order to [phone].  Thank you,  Daved Bull, PT, DPT   [facility] [address] [place], [place]  [address] Phone:  [phone] Fax:  [phone]

## 2023-10-07 ENCOUNTER — Other Ambulatory Visit: Payer: Self-pay

## 2023-10-08 ENCOUNTER — Ambulatory Visit: Attending: Neurology | Admitting: Physical Therapy

## 2023-10-08 ENCOUNTER — Encounter: Payer: Self-pay | Admitting: Physical Therapy

## 2023-10-08 ENCOUNTER — Other Ambulatory Visit: Payer: Self-pay | Admitting: Internal Medicine

## 2023-10-08 DIAGNOSIS — M5413 Radiculopathy, cervicothoracic region: Secondary | ICD-10-CM | POA: Diagnosis not present

## 2023-10-08 DIAGNOSIS — M546 Pain in thoracic spine: Secondary | ICD-10-CM | POA: Diagnosis not present

## 2023-10-08 DIAGNOSIS — F419 Anxiety disorder, unspecified: Secondary | ICD-10-CM

## 2023-10-08 DIAGNOSIS — M542 Cervicalgia: Secondary | ICD-10-CM | POA: Insufficient documentation

## 2023-10-08 DIAGNOSIS — F5101 Primary insomnia: Secondary | ICD-10-CM

## 2023-10-08 NOTE — Patient Instructions (Signed)
 Access Code: [facility] URL: [email or web address] Date: 10/08/2023 Prepared by: Daved Bull  Exercises - Standing Median Nerve Glide  - 1 x daily - 5 x weekly - 2 sets - 10 reps - Shoulder External Rotation and Scapular Retraction with Resistance  - 1 x daily - 5 x weekly - 3 sets - 10 reps - Thoracic Foam Roll Mobilization Bench Press  - 1 x daily - 5 x weekly - 2 sets - 10 reps - Sidelying Thoracic Lumbar Rotation  - 1 x daily - 5 x weekly - 1-2 sets - 10 reps - Seated Thoracic Extension at Wall  - 1 x daily - 5 x weekly - 1 sets - 5-6 reps - 5-10 seconds hold - Prone Scapular Retraction Arms at Side  - 1 x daily - 5 x weekly - 2 sets - 10 reps - Prone W Scapular Retraction  - 1 x daily - 5 x weekly - 2 sets - 10 reps

## 2023-10-08 NOTE — Therapy (Signed)
 OUTPATIENT PHYSICAL THERAPY THORACOLUMBAR EVALUATION   Patient Name: [name] MRN: [id number] DOB:1970-06-30, 53 y.o., male Today's Date: 10/08/2023  END OF SESSION:  PT End of Session - 10/08/23 1523     Visit Number 2    Number of Visits 9   8 + eval   Date for PT Re-Evaluation 12/10/23   pushed out due to delay in scheduling   Authorization Type Jolynn Davene Leyden    PT Start Time 1520    PT Stop Time 1613    PT Time Calculation (min) 53 min    Activity Tolerance Patient tolerated treatment well    Behavior During Therapy [facility] for tasks assessed/performed          Past Medical History:  Diagnosis Date   Anxiety 2018   takes Xanax  daily prn, panic attacks   Chronic gastric ulcer with bleeding s/p suture repair 2012 09/29/2010   Head 3 EGD which failed to stop the bleeding. Had exploratory laparotomy on 10/04/2010. Possible cause of the gastric ulcer was NSAID use.    Clotting disorder (HCC) 2012   Constipation    related to pain meds   Depression    Diabetes mellitus without complication (HCC)    Dizziness    occasionally   Dyspnea    walking a bit   Erosive esophagitis    External hemorrhoid, bleeding    Gastric ulcer    history of   GERD (gastroesophageal reflux disease) 2012   takes Protonix  bid   Hiatal hernia    History of blood clots 2012   in abdomen   History of blood transfusion 2012   Hyperlipidemia    Hypertension 2021   Incisional hernia s/p open repair w mesh Aug 2013 05/26/2011   Joint pain    knees   Nocturia    depends on amount of fluid he drinks   Pneumonia 2018   Pre-diabetes    Primary localized osteoarthritis of left hip 04/24/2014   Serrated polyp of colon    Sleep apnea 2016   no cpap  mild no cpap needed   Stroke (HCC) 2019   Unstable angina (HCC) 04/09/2020   Upper GI bleed    Ventral hernia    Past Surgical History:  Procedure Laterality Date   ANTERIOR CERVICAL DECOMPRESSION/DISCECTOMY FUSION 4 LEVELS N/A 04/08/2018    Procedure: ACDF - C3-C4 - C4-C5 - C5-C6 - C6-C7;  Surgeon: Onetha Kuba, MD;  Location: [facility] OR;  Service: Neurosurgery;  Laterality: N/A;   CARPAL TUNNEL RELEASE Left 04/03/2018   COLONOSCOPY     COLONOSCOPY  07/15/2021   ESOPHAGEAL MANOMETRY N/A 10/19/2016   Procedure: ESOPHAGEAL MANOMETRY (EM);  Surgeon: Shila Gustav GAILS, MD;  Location: [facility] ENDOSCOPY;  Service: Endoscopy;  Laterality: N/A;   ESOPHAGOGASTRODUODENOSCOPY     ESOPHAGOGASTRODUODENOSCOPY N/A 01/22/2017   Procedure: ESOPHAGOGASTRODUODENOSCOPY (EGD);  Surgeon: Sheldon Standing, MD;  Location: [facility] ORS;  Service: General;  Laterality: N/A;   HERNIA REPAIR  2018   INCISIONAL HERNIA REPAIR  09/04/2011   Procedure: HERNIA REPAIR INCISIONAL;  Surgeon: Redell Faith, DO;  Location: [facility] OR;  Service: General;  Laterality: N/A;  debridment calcified mass   INSERTION OF MESH  09/04/2011   retrorectus ultrapro 30x30cm   INSERTION OF MESH N/A 01/22/2017   Procedure: INSERTION OF MESH;  Surgeon: Sheldon Standing, MD;  Location: [facility] ORS;  Service: General;  Laterality: N/A;   JOINT REPLACEMENT  2016   LEFT HEART CATH AND CORONARY ANGIOGRAPHY N/A 04/11/2020  Procedure: LEFT HEART CATH AND CORONARY ANGIOGRAPHY;  Surgeon: Court Dorn PARAS, MD;  Location: [facility] INVASIVE CV LAB;  Service: Cardiovascular;  Laterality: N/A;   NISSEN FUNDOPLICATION     Open 1990s   POLYPECTOMY     STOMACH SURGERY  10/05/2010   Oversewing of gastric ulcer.  Dr Vanderbilt   TOTAL HIP ARTHROPLASTY Left 04/24/2014   Procedure: LEFT TOTAL HIP ARTHROPLASTY;  Surgeon: Fonda Olmsted, MD;  Location: [facility] OR;  Service: Orthopedics;  Laterality: Left;   UPPER GASTROINTESTINAL ENDOSCOPY     Patient Active Problem List   Diagnosis Date Noted   Carpal tunnel syndrome 08/27/2020   Type 2 diabetes mellitus without complication, without long-term current use of insulin (HCC) 05/02/2020   Class 2 obesity due to excess calories with body mass index (BMI) of 35.0 to 35.9 in adult 05/01/2020    HTN (hypertension) 08/17/2019   Frequent headaches 05/15/2019   Hyperlipidemia associated with type 2 diabetes mellitus (HCC) 12/21/2018   History of CVA (cerebrovascular accident) without residual deficits 10/05/2017   Gastroesophageal reflux disease    Insomnia 05/30/2014   Anxiety 05/26/2011    PCP: Antonette Angeline ORN, NP  REFERRING PROVIDER: Skeet Juliene SAUNDERS, DO  REFERRING DIAG: M54.9 (ICD-10-CM) - Back pain, unspecified back location, unspecified back pain laterality, unspecified chronicity  Rationale for Evaluation and Treatment: Rehabilitation  THERAPY DIAG:  Radiculopathy, cervicothoracic region  Cervicalgia  Pain in thoracic spine  ONSET DATE: since 2020  SUBJECTIVE:                                                                                                                                                                                           SUBJECTIVE STATEMENT: [name]  Pt reports the pain in his upper back persists today.  He has had a busy work week and golfed this past weekend which did not help.  PERTINENT HISTORY:  Anxiety, GERD, CVA, headaches, HTN, DM2, CTS s/p bilateral release 2020, left THA 2016, hernia repair x2 2013 & 2018, ACDF C3-C7 2020  PAIN:  Are you having pain? Yes: NPRS scale: 4-5 Pain location: between shoulder blades Pain description: N/T, burning Aggravating factors: mornings, nights depending on activity level during the day Relieving factors: trying to stretch  PRECAUTIONS: Fall  RED FLAGS: None   WEIGHT BEARING RESTRICTIONS: No  FALLS:  Has patient fallen in last 6 months? Yes. Number of falls 2-3 - I'll just stumble and fall  LIVING ENVIRONMENT: Lives with: brother Lives in: House/apartment Stairs: No Has following equipment at home: None  OCCUPATION: [employer]  PLOF: Independent  PATIENT GOALS: To see if we can get  to where it's hurting  NEXT MD VISIT: 11/04/2023 Antonette Angeline ORN,  NP  OBJECTIVE:  Note: Objective measures were completed at Evaluation unless otherwise noted.  DIAGNOSTIC FINDINGS:  MRI Cervical 04/20/2022: IMPRESSION: 1. Status post C3-7 fusion. The central canal is open at the operated levels. Mild to moderate foraminal narrowing at C6-7 is more notable on the left. 2. Small focus of chronic myelomalacia in the right hemicord at approximately C5-6.  PATIENT SURVEYS:  Modified Oswestry:  MODIFIED OSWESTRY DISABILITY SCALE  Date: 09/29/2023 Score  Pain intensity 0 = I can tolerate the pain I have without having to use pain medication.  2. Personal care (washing, dressing, etc.) 0 =  I can take care of myself normally without causing increased pain.  3. Lifting 0 = I can lift heavy weights without increased pain.  4. Walking 0 = Pain does not prevent me from walking any distance  5. Sitting 2 =  Pain prevents me from sitting more than 1 hour.  6. Standing 0 =  I can stand as long as I want without increased pain.  7. Sleeping 1 = I can sleep well only by using pain medication.  8. Social Life 0 = My social life is normal and does not increase my pain.  9. Traveling 0 =  I can travel anywhere without increased pain.  10. Employment/ Homemaking 0 = My normal homemaking/job activities do not cause pain.  Total 3/50   Interpretation of scores: Score Category Description  0-20% Minimal Disability The patient can cope with most living activities. Usually no treatment is indicated apart from advice on lifting, sitting and exercise  21-40% Moderate Disability The patient experiences more pain and difficulty with sitting, lifting and standing. Travel and social life are more difficult and they may be disabled from work. Personal care, sexual activity and sleeping are not grossly affected, and the patient can usually be managed by conservative means  41-60% Severe Disability Pain remains the main problem in this group, but activities of daily living are  affected. These patients require a detailed investigation  61-80% Crippled Back pain impinges on all aspects of the patient's life. Positive intervention is required  81-100% Bed-bound  These patients are either bed-bound or exaggerating their symptoms  Bluford FORBES Zoe DELENA Karon DELENA, et al. Surgery versus conservative management of stable thoracolumbar fracture: the PRESTO feasibility RCT. [place] ([place]): [employer]; 2021 Nov. [facility] Technology Assessment, No. 25.62.) Appendix 3, Oswestry Disability Index category descriptors. Available from: FindJewelers.cz  Minimally Clinically Important Difference (MCID) = 12.8% NDI:  NECK DISABILITY INDEX  Date: 09/29/2023 Score  Pain intensity 2 = The pain is moderate at the moment  2. Personal care (washing, dressing, etc.) 0 = I can look after myself normally without causing extra pain  3. Lifting 0 =  I can lift heavy weights without extra pain  4. Reading 0 = I can read as much as I want to with no pain in my neck  5. Headaches 1 =  I have slight headaches, which come infrequently  6. Concentration 0 =  I can concentrate fully when I want to with no difficulty  7. Work 0 =  I can do as much work as I want to  8. Driving 1 =  I can drive my car as long as I want with slight pain in my neck  9. Sleeping 2 = My sleep is mildly disturbed (1-2 hrs sleepless)  10. Recreation 1 =  I am able to engage in all my recreation activities, with some pain in my neck  Total 7/50   Minimum Detectable Change (90% confidence): 5 points or 10% points  COGNITION: Overall cognitive status: Within functional limits for tasks assessed     SENSATION: Light touch: Impaired   POSTURE: rounded shoulders, forward head, and posterior pelvic tilt  PALPATION: Bilateral T4 area - rhomboids and levator scapulae - noted stretched posture of musculature likely due to chronic rounding, possible left oriented rhomboid TP, mild kyphosis  and flattening of cervical lordosis  THORACIC ROM:   AROM eval  Flexion WFL  Extension 25% loss; good muscle activation symmetrically  Right lateral flexion   Left lateral flexion   Right rotation   Left rotation    (Blank rows = not tested)  LOWER EXTREMITY ROM:     Active  Right eval Left eval  Hip flexion WNL WNL  Hip extension    Hip abduction    Hip adduction    Hip internal rotation    Hip external rotation    Knee flexion    Knee extension    Ankle dorsiflexion    Ankle plantarflexion    Ankle inversion    Ankle eversion     (Blank rows = not tested)  LOWER EXTREMITY MMT:    MMT Right eval Left eval  Hip flexion 5 5  Hip extension    Hip abduction 5 5  Hip adduction    Hip internal rotation    Hip external rotation    Knee flexion 5 5  Knee extension 5 5  Ankle dorsiflexion 5 5  Ankle plantarflexion 5 5  Ankle inversion    Ankle eversion     (Blank rows = not tested)  LUMBAR SPECIAL TESTS:  None relevant to chief complaint.  FUNCTIONAL TESTS:  5 times sit to stand: 11.56 sec no UE support  GAIT: Distance walked: various clinic distances Assistive device utilized: None Level of assistance: Complete Independence Comments: No overt instability, mildly dampened arm swing bilaterally.  TREATMENT DATE: 10/08/2023                                                                                                                              -Supine thoracic mob over vertical bolster w/ small lateral oscillations for comfort x2 minutes > chest stretch x1 minute > Bil shoulder flexion x15 > chest press x15 -Side-lying thoracic open book x10 each side -Seated thoracic mobilization against wall x6 w/ 5 second holds -IASTM and spike ball to left infraspinatus and rhomboids w/ palpable crackling w/ some improvement post-STM -Prone I, Y, T, W 2x10 each -Supine chirp wheel x6 minutes - had pt repeated head rotation, manual tension with wheel  rotation, chin tucks and cervical extension for light myofascial stretching  PATIENT EDUCATION:  Education details:  Schedule OT eval.  Continue HEP w/ additions.  Discussed positional cavitation when he is stiff midday and moves  around creating some cracking in his spine. Person educated: Patient Education method: Explanation, Demonstration, Verbal cues, and Handouts Education comprehension: verbalized understanding, returned demonstration, verbal cues required, and needs further education  HOME EXERCISE PROGRAM: Access Code: [facility] URL: [email or web address] Date: 10/08/2023 Prepared by: Daved Bull  Exercises - Standing Median Nerve Glide  - 1 x daily - 5 x weekly - 2 sets - 10 reps - Shoulder External Rotation and Scapular Retraction with Resistance  - 1 x daily - 5 x weekly - 3 sets - 10 reps - Thoracic Foam Roll Mobilization Bench Press  - 1 x daily - 5 x weekly - 2 sets - 10 reps - Sidelying Thoracic Lumbar Rotation  - 1 x daily - 5 x weekly - 1-2 sets - 10 reps - Seated Thoracic Extension at Wall  - 1 x daily - 5 x weekly - 1 sets - 5-6 reps - 5-10 seconds hold - Prone Scapular Retraction Arms at Side  - 1 x daily - 5 x weekly - 2 sets - 10 reps - Prone W Scapular Retraction  - 1 x daily - 5 x weekly - 2 sets - 10 reps  ASSESSMENT:  CLINICAL IMPRESSION: Focus of skilled session today on introducing pain management and TP mobilization with IASTM, chirp wheel, and spike ball.  Pt has best response to spike ball mobilization with some release of left infraspinatus TP noted.  Will repeat most successful mobilization techniques at next visit with other thoracic mobilization that he responded well to today added to HEP.  He would benefit from further skilled PT interventions to address persistent left worse than right shoulder pain and cervicothoracic pain and stiffness.  Continue per POC.   OBJECTIVE IMPAIRMENTS: decreased activity tolerance, decreased mobility,  hypomobility, impaired flexibility, impaired sensation, improper body mechanics, postural dysfunction, and pain.   ACTIVITY LIMITATIONS: carrying, lifting, and reach over head  PARTICIPATION LIMITATIONS: meal prep, cleaning, laundry, and occupation  PERSONAL FACTORS: Past/current experiences, Profession, Time since onset of injury/illness/exacerbation, and 1-2 comorbidities: CTS s/p release and ACDF C3-C7 are also affecting patient's functional outcome.   REHAB POTENTIAL: Good  CLINICAL DECISION MAKING: Evolving/moderate complexity  EVALUATION COMPLEXITY: Moderate   GOALS: Goals reviewed with patient? Yes  SHORT TERM GOALS: Target date: 10/29/2023  Pt will be independent and compliant with strength, stretching, and mobilization focused HEP in order to maintain functional progress and improve mobility. Baseline:  Initiated on eval Goal status: INITIAL  LONG TERM GOALS: Target date: 11/26/2023  Pt will be independent and compliant with advanced and finalized strength, stretching, and mobilization focused HEP in order to maintain functional progress and improve mobility. Baseline: Initiated on eval Goal status: INITIAL  2.  NDI will improve to </=2/50 in order to demonstrate improved pain management and quality of life. Baseline: 7/50 Goal status: INITIAL  3.  Pt will report average N/T over recent 7 days as </=2-3 in order to demonstrate improved symptom management. Baseline: 4-5 down BUE Goal status: INITIAL  PLAN:  PT FREQUENCY: 1x/week  PT DURATION: 8 weeks  PLANNED INTERVENTIONS: [id number]- PT Re-evaluation, 97750- Physical Performance Testing, 97110-Therapeutic exercises, 97530- Therapeutic activity, [id number]- Neuromuscular re-education, 97535- Self Care, [id number]- Manual therapy, [phone]- Electrical stimulation (manual), Patient/Family education, Joint mobilization, Spinal mobilization, Scar mobilization, Cryotherapy, and Moist heat.  PLAN FOR NEXT SESSION: Expand HEP - REPRINT  ADDITIONS.  thoracic mobilizations, IASTM to left rhomboid, periscapular strength - rows and face pulls and lat pulldowns, nerve glides - already provided median nerve in standing  Repeat chirp wheel and spike ball mob to left shoulder and lower cervical regions.  Daved KATHEE Bull, PT, DPT 10/08/2023, 4:28 PM

## 2023-10-08 NOTE — Telephone Encounter (Signed)
 Requested medication (s) are due for refill today: Yes  Requested medication (s) are on the active medication list: Yes  Last refill:  09/10/23  Future visit scheduled: No  Notes to clinic:  Not delegated.    Requested Prescriptions  Pending Prescriptions Disp Refills   zolpidem  (AMBIEN  CR) 12.5 MG CR tablet 30 tablet 0    Sig: Take 1 tablet (12.5 mg total) by mouth at bedtime.     Not Delegated - Psychiatry:  Anxiolytics/Hypnotics Failed - 10/08/2023  3:11 PM      Failed - This refill cannot be delegated      Failed - Urine Drug Screen completed in last 360 days      Passed - Valid encounter within last 6 months    Recent Outpatient Visits           5 months ago Encounter for general adult medical examination with abnormal findings   [place] [facility] [place], [place] W, NP               clonazePAM  (KLONOPIN ) 0.5 MG tablet 30 tablet 0    Sig: Take 1 tablet (0.5 mg total) by mouth at bedtime.     Not Delegated - Psychiatry: Anxiolytics/Hypnotics 2 Failed - 10/08/2023  3:11 PM      Failed - This refill cannot be delegated      Failed - Urine Drug Screen completed in last 360 days      Passed - Patient is not pregnant      Passed - Valid encounter within last 6 months    Recent Outpatient Visits           5 months ago Encounter for general adult medical examination with abnormal findings   [place] [facility] [place], Angeline ORN, NP

## 2023-10-11 ENCOUNTER — Other Ambulatory Visit: Payer: Self-pay

## 2023-10-11 MED ORDER — ZOLPIDEM TARTRATE ER 12.5 MG PO TBCR
12.5000 mg | EXTENDED_RELEASE_TABLET | Freq: Every day | ORAL | 0 refills | Status: DC
  Filled 2023-10-11: qty 30, 30d supply, fill #0

## 2023-10-11 MED ORDER — CLONAZEPAM 0.5 MG PO TABS
0.5000 mg | ORAL_TABLET | Freq: Every day | ORAL | 0 refills | Status: DC
  Filled 2023-10-11: qty 30, 30d supply, fill #0

## 2023-10-13 ENCOUNTER — Ambulatory Visit: Admitting: Physical Therapy

## 2023-10-13 ENCOUNTER — Encounter: Payer: Self-pay | Admitting: Physical Therapy

## 2023-10-13 DIAGNOSIS — M5413 Radiculopathy, cervicothoracic region: Secondary | ICD-10-CM

## 2023-10-13 DIAGNOSIS — M546 Pain in thoracic spine: Secondary | ICD-10-CM | POA: Diagnosis not present

## 2023-10-13 DIAGNOSIS — M542 Cervicalgia: Secondary | ICD-10-CM

## 2023-10-13 NOTE — Therapy (Signed)
 OUTPATIENT PHYSICAL THERAPY THORACOLUMBAR TREATMENT   Patient Name: [name] MRN: [id number] DOB:08-13-1970, 53 y.o., male Today's Date: 10/13/2023  END OF SESSION:  PT End of Session - 10/13/23 0857     Visit Number 3    Number of Visits 9   8 + eval   Date for PT Re-Evaluation 12/10/23   pushed out due to delay in scheduling   Authorization Type Jolynn Davene Leyden    PT Start Time 9144    PT Stop Time [phone]    PT Time Calculation (min) 46 min    Activity Tolerance Patient tolerated treatment well    Behavior During Therapy [facility] for tasks assessed/performed          Past Medical History:  Diagnosis Date   Anxiety 2018   takes Xanax  daily prn, panic attacks   Chronic gastric ulcer with bleeding s/p suture repair 2012 09/29/2010   Head 3 EGD which failed to stop the bleeding. Had exploratory laparotomy on 10/04/2010. Possible cause of the gastric ulcer was NSAID use.    Clotting disorder (HCC) 2012   Constipation    related to pain meds   Depression    Diabetes mellitus without complication (HCC)    Dizziness    occasionally   Dyspnea    walking a bit   Erosive esophagitis    External hemorrhoid, bleeding    Gastric ulcer    history of   GERD (gastroesophageal reflux disease) 2012   takes Protonix  bid   Hiatal hernia    History of blood clots 2012   in abdomen   History of blood transfusion 2012   Hyperlipidemia    Hypertension 2021   Incisional hernia s/p open repair w mesh Aug 2013 05/26/2011   Joint pain    knees   Nocturia    depends on amount of fluid he drinks   Pneumonia 2018   Pre-diabetes    Primary localized osteoarthritis of left hip 04/24/2014   Serrated polyp of colon    Sleep apnea 2016   no cpap  mild no cpap needed   Stroke (HCC) 2019   Unstable angina (HCC) 04/09/2020   Upper GI bleed    Ventral hernia    Past Surgical History:  Procedure Laterality Date   ANTERIOR CERVICAL DECOMPRESSION/DISCECTOMY FUSION 4 LEVELS N/A 04/08/2018    Procedure: ACDF - C3-C4 - C4-C5 - C5-C6 - C6-C7;  Surgeon: Onetha Kuba, MD;  Location: [facility] OR;  Service: Neurosurgery;  Laterality: N/A;   CARPAL TUNNEL RELEASE Left 04/03/2018   COLONOSCOPY     COLONOSCOPY  07/15/2021   ESOPHAGEAL MANOMETRY N/A 10/19/2016   Procedure: ESOPHAGEAL MANOMETRY (EM);  Surgeon: Shila Gustav GAILS, MD;  Location: [facility] ENDOSCOPY;  Service: Endoscopy;  Laterality: N/A;   ESOPHAGOGASTRODUODENOSCOPY     ESOPHAGOGASTRODUODENOSCOPY N/A 01/22/2017   Procedure: ESOPHAGOGASTRODUODENOSCOPY (EGD);  Surgeon: Sheldon Standing, MD;  Location: [facility] ORS;  Service: General;  Laterality: N/A;   HERNIA REPAIR  2018   INCISIONAL HERNIA REPAIR  09/04/2011   Procedure: HERNIA REPAIR INCISIONAL;  Surgeon: Redell Faith, DO;  Location: [facility] OR;  Service: General;  Laterality: N/A;  debridment calcified mass   INSERTION OF MESH  09/04/2011   retrorectus ultrapro 30x30cm   INSERTION OF MESH N/A 01/22/2017   Procedure: INSERTION OF MESH;  Surgeon: Sheldon Standing, MD;  Location: [facility] ORS;  Service: General;  Laterality: N/A;   JOINT REPLACEMENT  2016   LEFT HEART CATH AND CORONARY ANGIOGRAPHY N/A 04/11/2020  Procedure: LEFT HEART CATH AND CORONARY ANGIOGRAPHY;  Surgeon: Court Dorn PARAS, MD;  Location: [facility] INVASIVE CV LAB;  Service: Cardiovascular;  Laterality: N/A;   NISSEN FUNDOPLICATION     Open 1990s   POLYPECTOMY     STOMACH SURGERY  10/05/2010   Oversewing of gastric ulcer.  Dr Vanderbilt   TOTAL HIP ARTHROPLASTY Left 04/24/2014   Procedure: LEFT TOTAL HIP ARTHROPLASTY;  Surgeon: Fonda Olmsted, MD;  Location: [facility] OR;  Service: Orthopedics;  Laterality: Left;   UPPER GASTROINTESTINAL ENDOSCOPY     Patient Active Problem List   Diagnosis Date Noted   Carpal tunnel syndrome 08/27/2020   Type 2 diabetes mellitus without complication, without long-term current use of insulin (HCC) 05/02/2020   Class 2 obesity due to excess calories with body mass index (BMI) of 35.0 to 35.9 in adult 05/01/2020    HTN (hypertension) 08/17/2019   Frequent headaches 05/15/2019   Hyperlipidemia associated with type 2 diabetes mellitus (HCC) 12/21/2018   History of CVA (cerebrovascular accident) without residual deficits 10/05/2017   Gastroesophageal reflux disease    Insomnia 05/30/2014   Anxiety 05/26/2011    PCP: Antonette Angeline ORN, NP  REFERRING PROVIDER: Skeet Juliene SAUNDERS, DO  REFERRING DIAG: M54.9 (ICD-10-CM) - Back pain, unspecified back location, unspecified back pain laterality, unspecified chronicity  Rationale for Evaluation and Treatment: Rehabilitation  THERAPY DIAG:  Cervicalgia  Pain in thoracic spine  Radiculopathy, cervicothoracic region  ONSET DATE: since 2020  SUBJECTIVE:                                                                                                                                                                                           SUBJECTIVE STATEMENT: [name]  Pt reports the stiffness in his upper back persists today.  He has not had any falls or other exacerbating events.  PERTINENT HISTORY:  Anxiety, GERD, CVA, headaches, HTN, DM2, CTS s/p bilateral release 2020, left THA 2016, hernia repair x2 2013 & 2018, ACDF C3-C7 2020  PAIN:  Are you having pain? Yes: NPRS scale: 4-5 Pain location: between shoulder blades Pain description: stiffness Aggravating factors: mornings, nights depending on activity level during the day Relieving factors: trying to stretch  PRECAUTIONS: Fall  RED FLAGS: None   WEIGHT BEARING RESTRICTIONS: No  FALLS:  Has patient fallen in last 6 months? Yes. Number of falls 2-3 - I'll just stumble and fall  LIVING ENVIRONMENT: Lives with: brother Lives in: House/apartment Stairs: No Has following equipment at home: None  OCCUPATION: [employer]  PLOF: Independent  PATIENT GOALS: To see if we can get to where it's hurting  NEXT MD  VISIT: 11/04/2023 Antonette Angeline ORN, NP  OBJECTIVE:  Note: Objective  measures were completed at Evaluation unless otherwise noted.  DIAGNOSTIC FINDINGS:  MRI Cervical 04/20/2022: IMPRESSION: 1. Status post C3-7 fusion. The central canal is open at the operated levels. Mild to moderate foraminal narrowing at C6-7 is more notable on the left. 2. Small focus of chronic myelomalacia in the right hemicord at approximately C5-6.  PATIENT SURVEYS:  Modified Oswestry:  MODIFIED OSWESTRY DISABILITY SCALE  Date: 09/29/2023 Score  Pain intensity 0 = I can tolerate the pain I have without having to use pain medication.  2. Personal care (washing, dressing, etc.) 0 =  I can take care of myself normally without causing increased pain.  3. Lifting 0 = I can lift heavy weights without increased pain.  4. Walking 0 = Pain does not prevent me from walking any distance  5. Sitting 2 =  Pain prevents me from sitting more than 1 hour.  6. Standing 0 =  I can stand as long as I want without increased pain.  7. Sleeping 1 = I can sleep well only by using pain medication.  8. Social Life 0 = My social life is normal and does not increase my pain.  9. Traveling 0 =  I can travel anywhere without increased pain.  10. Employment/ Homemaking 0 = My normal homemaking/job activities do not cause pain.  Total 3/50   Interpretation of scores: Score Category Description  0-20% Minimal Disability The patient can cope with most living activities. Usually no treatment is indicated apart from advice on lifting, sitting and exercise  21-40% Moderate Disability The patient experiences more pain and difficulty with sitting, lifting and standing. Travel and social life are more difficult and they may be disabled from work. Personal care, sexual activity and sleeping are not grossly affected, and the patient can usually be managed by conservative means  41-60% Severe Disability Pain remains the main problem in this group, but activities of daily living are affected. These patients require a  detailed investigation  61-80% Crippled Back pain impinges on all aspects of the patient's life. Positive intervention is required  81-100% Bed-bound  These patients are either bed-bound or exaggerating their symptoms  Bluford FORBES Zoe DELENA Karon DELENA, et al. Surgery versus conservative management of stable thoracolumbar fracture: the PRESTO feasibility RCT. [place] ([place]): [employer]; 2021 Nov. [employer] Technology Assessment, No. 25.62.) Appendix 3, Oswestry Disability Index category descriptors. Available from: FindJewel[employer].cz  Minimally Clinically Important Difference (MCID) = 12.8% NDI:  NECK DISABILITY INDEX  Date: 09/29/2023 Score  Pain intensity 2 = The pain is moderate at the moment  2. Personal care (washing, dressing, etc.) 0 = I can look after myself normally without causing extra pain  3. Lifting 0 =  I can lift heavy weights without extra pain  4. Reading 0 = I can read as much as I want to with no pain in my neck  5. Headaches 1 =  I have slight headaches, which come infrequently  6. Concentration 0 =  I can concentrate fully when I want to with no difficulty  7. Work 0 =  I can do as much work as I want to  8. Driving 1 =  I can drive my car as long as I want with slight pain in my neck  9. Sleeping 2 = My sleep is mildly disturbed (1-2 hrs sleepless)  10. Recreation 1 =  I am able to engage in all  my recreation activities, with some pain in my neck  Total 7/50   Minimum Detectable Change (90% confidence): 5 points or 10% points  COGNITION: Overall cognitive status: Within functional limits for tasks assessed     SENSATION: Light touch: Impaired   POSTURE: rounded shoulders, forward head, and posterior pelvic tilt  PALPATION: Bilateral T4 area - rhomboids and levator scapulae - noted stretched posture of musculature likely due to chronic rounding, possible left oriented rhomboid TP, mild kyphosis and flattening of cervical  lordosis  THORACIC ROM:   AROM eval  Flexion WFL  Extension 25% loss; good muscle activation symmetrically  Right lateral flexion   Left lateral flexion   Right rotation   Left rotation    (Blank rows = not tested)  LOWER EXTREMITY ROM:     Active  Right eval Left eval  Hip flexion WNL WNL  Hip extension    Hip abduction    Hip adduction    Hip internal rotation    Hip external rotation    Knee flexion    Knee extension    Ankle dorsiflexion    Ankle plantarflexion    Ankle inversion    Ankle eversion     (Blank rows = not tested)  LOWER EXTREMITY MMT:    MMT Right eval Left eval  Hip flexion 5 5  Hip extension    Hip abduction 5 5  Hip adduction    Hip internal rotation    Hip external rotation    Knee flexion 5 5  Knee extension 5 5  Ankle dorsiflexion 5 5  Ankle plantarflexion 5 5  Ankle inversion    Ankle eversion     (Blank rows = not tested)  LUMBAR SPECIAL TESTS:  None relevant to chief complaint.  FUNCTIONAL TESTS:  5 times sit to stand: 11.56 sec no UE support  GAIT: Distance walked: various clinic distances Assistive device utilized: None Level of assistance: Complete Independence Comments: No overt instability, mildly dampened arm swing bilaterally.  TREATMENT DATE: 10/13/2023                                                                                                                              -Hook-lying over long placed MHP x2 minutes resting > LTRs x 2-3 minutes > chest press x 1-2 minutes > chirp wheel w/ angled points x4 minutes working on cervical rotation/flexion/extension  -Positioned in prone w/ heat on lower thoracic spine for upper thoracic, periscapular, and upper trap STM w/ spike ball - trigger points noted in left middle trap and right rhomboid minor area w/ significant time spent bilaterally -Sitting EOM pt instructed in TP release w/ theracane and had pt return demo several minutes as he has theracane at  home -Arm bike x4 minute backwards > x4 minutes forwards on level 4.0 for postural awareness and engagement  PATIENT EDUCATION:  Education details:  Schedule OT eval.  Continue HEP w/ additions - he  declines printout as he uses [employer] Go app.  Encouraged theracane use on TP at home and arm bike in community gym he has access to. Person educated: Patient Education method: Explanation, Demonstration, Verbal cues, and Handouts Education comprehension: verbalized understanding, returned demonstration, verbal cues required, and needs further education  HOME EXERCISE PROGRAM: Access Code: [facility] URL: [email or web address] Date: 10/08/2023 Prepared by: Daved Bull  Exercises - Standing Median Nerve Glide  - 1 x daily - 5 x weekly - 2 sets - 10 reps - Shoulder External Rotation and Scapular Retraction with Resistance  - 1 x daily - 5 x weekly - 3 sets - 10 reps - Thoracic Foam Roll Mobilization Bench Press  - 1 x daily - 5 x weekly - 2 sets - 10 reps - Sidelying Thoracic Lumbar Rotation  - 1 x daily - 5 x weekly - 1-2 sets - 10 reps - Seated Thoracic Extension at Wall  - 1 x daily - 5 x weekly - 1 sets - 5-6 reps - 5-10 seconds hold - Prone Scapular Retraction Arms at Side  - 1 x daily - 5 x weekly - 2 sets - 10 reps - Prone W Scapular Retraction  - 1 x daily - 5 x weekly - 2 sets - 10 reps  ASSESSMENT:  CLINICAL IMPRESSION: Focus of skilled session today on manual techniques for trigger point release and general pain and stiffness management.  He responds well to techniques utilized today and has a theracane at home to continue working on posterior oriented trigger points.  He would further benefit from skilled PT to address postural changes impacting pain as well as muscle tension, hypomobility and upper body stability.  Continue per POC.   OBJECTIVE IMPAIRMENTS: decreased activity tolerance, decreased mobility, hypomobility, impaired flexibility, impaired sensation,  improper body mechanics, postural dysfunction, and pain.   ACTIVITY LIMITATIONS: carrying, lifting, and reach over head  PARTICIPATION LIMITATIONS: meal prep, cleaning, laundry, and occupation  PERSONAL FACTORS: Past/current experiences, Profession, Time since onset of injury/illness/exacerbation, and 1-2 comorbidities: CTS s/p release and ACDF C3-C7 are also affecting patient's functional outcome.   REHAB POTENTIAL: Good  CLINICAL DECISION MAKING: Evolving/moderate complexity  EVALUATION COMPLEXITY: Moderate   GOALS: Goals reviewed with patient? Yes  SHORT TERM GOALS: Target date: 10/29/2023  Pt will be independent and compliant with strength, stretching, and mobilization focused HEP in order to maintain functional progress and improve mobility. Baseline:  Initiated on eval Goal status: INITIAL  LONG TERM GOALS: Target date: 11/26/2023  Pt will be independent and compliant with advanced and finalized strength, stretching, and mobilization focused HEP in order to maintain functional progress and improve mobility. Baseline: Initiated on eval Goal status: INITIAL  2.  NDI will improve to </=2/50 in order to demonstrate improved pain management and quality of life. Baseline: 7/50 Goal status: INITIAL  3.  Pt will report average N/T over recent 7 days as </=2-3 in order to demonstrate improved symptom management. Baseline: 4-5 down BUE Goal status: INITIAL  PLAN:  PT FREQUENCY: 1x/week  PT DURATION: 8 weeks  PLANNED INTERVENTIONS: 97164- PT Re-evaluation, 97750- Physical Performance Testing, 97110-Therapeutic exercises, 97530- Therapeutic activity, [id number]- Neuromuscular re-education, 97535- Self Care, [id number]- Manual therapy, [phone]- Electrical stimulation (manual), Patient/Family education, Joint mobilization, Spinal mobilization, Scar mobilization, Cryotherapy, and Moist heat.  PLAN FOR NEXT SESSION: Expand HEP. thoracic mobilizations, IASTM to left and right TP, periscapular  strength - rows and face pulls and lat pulldowns, nerve glides - already provided median nerve in standing  Repeat chirp wheel and spike ball mob to left shoulder and lower cervical regions.  Daved KATHEE Bull, PT, DPT 10/13/2023, 10:02 AM

## 2023-10-19 ENCOUNTER — Ambulatory Visit: Admitting: Physical Therapy

## 2023-10-19 ENCOUNTER — Encounter: Payer: Self-pay | Admitting: Physical Therapy

## 2023-10-19 DIAGNOSIS — M546 Pain in thoracic spine: Secondary | ICD-10-CM | POA: Diagnosis not present

## 2023-10-19 DIAGNOSIS — M5413 Radiculopathy, cervicothoracic region: Secondary | ICD-10-CM

## 2023-10-19 DIAGNOSIS — M542 Cervicalgia: Secondary | ICD-10-CM | POA: Diagnosis not present

## 2023-10-19 NOTE — Therapy (Signed)
 OUTPATIENT PHYSICAL THERAPY THORACOLUMBAR TREATMENT   Patient Name: [name] MRN: [id number] DOB:23-Mar-1970, 53 y.o., male Today's Date: 10/19/2023  END OF SESSION:  PT End of Session - 10/19/23 0908     Visit Number 4    Number of Visits 9   8 + eval   Date for PT Re-Evaluation 12/10/23   pushed out due to delay in scheduling   Authorization Type [name]    PT Start Time 0845    PT Stop Time 0930    PT Time Calculation (min) 45 min    Activity Tolerance Patient tolerated treatment well    Behavior During Therapy [facility] for tasks assessed/performed          Past Medical History:  Diagnosis Date   Anxiety 2018   takes Xanax  daily prn, panic attacks   Chronic gastric ulcer with bleeding s/p suture repair 2012 09/29/2010   Head 3 EGD which failed to stop the bleeding. Had exploratory laparotomy on 10/04/2010. Possible cause of the gastric ulcer was NSAID use.    Clotting disorder (HCC) 2012   Constipation    related to pain meds   Depression    Diabetes mellitus without complication (HCC)    Dizziness    occasionally   Dyspnea    walking a bit   Erosive esophagitis    External hemorrhoid, bleeding    Gastric ulcer    history of   GERD (gastroesophageal reflux disease) 2012   takes Protonix  bid   Hiatal hernia    History of blood clots 2012   in abdomen   History of blood transfusion 2012   Hyperlipidemia    Hypertension 2021   Incisional hernia s/p open repair w mesh Aug 2013 05/26/2011   Joint pain    knees   Nocturia    depends on amount of fluid he drinks   Pneumonia 2018   Pre-diabetes    Primary localized osteoarthritis of left hip 04/24/2014   Serrated polyp of colon    Sleep apnea 2016   no cpap  mild no cpap needed   Stroke (HCC) 2019   Unstable angina (HCC) 04/09/2020   Upper GI bleed    Ventral hernia    Past Surgical History:  Procedure Laterality Date   ANTERIOR CERVICAL DECOMPRESSION/DISCECTOMY FUSION 4 LEVELS N/A 04/08/2018    Procedure: ACDF - C3-C4 - C4-C5 - C5-C6 - C6-C7;  Surgeon: [name];  Location: [facility] OR;  Service: Neurosurgery;  Laterality: N/A;   CARPAL TUNNEL RELEASE Left 04/03/2018   COLONOSCOPY     COLONOSCOPY  07/15/2021   ESOPHAGEAL MANOMETRY N/A 10/19/2016   Procedure: ESOPHAGEAL MANOMETRY (EM);  Surgeon: [name];  Location: [facility];  Service: Endoscopy;  Laterality: N/A;   ESOPHAGOGASTRODUODENOSCOPY     ESOPHAGOGASTRODUODENOSCOPY N/A 01/22/2017   Procedure: ESOPHAGOGASTRODUODENOSCOPY (EGD);  Surgeon: [name];  Location: [facility] ORS;  Service: General;  Laterality: N/A;   HERNIA REPAIR  2018   INCISIONAL HERNIA REPAIR  09/04/2011   Procedure: HERNIA REPAIR INCISIONAL;  Surgeon: [name];  Location: [facility] OR;  Service: General;  Laterality: N/A;  debridment calcified mass   INSERTION OF MESH  09/04/2011   retrorectus ultrapro 30x30cm   INSERTION OF MESH N/A 01/22/2017   Procedure: INSERTION OF MESH;  Surgeon: [name];  Location: [facility];  Service: General;  Laterality: N/A;   JOINT REPLACEMENT  2016   LEFT HEART CATH AND CORONARY ANGIOGRAPHY N/A 04/11/2020  Procedure: LEFT HEART CATH AND CORONARY ANGIOGRAPHY;  Surgeon: [name];  Location: [facility];  Service: Cardiovascular;  Laterality: N/A;   NISSEN FUNDOPLICATION     Open 1990s   POLYPECTOMY     STOMACH SURGERY  10/05/2010   Oversewing of gastric ulcer.  Dr Vanderbilt   TOTAL HIP ARTHROPLASTY Left 04/24/2014   Procedure: LEFT TOTAL HIP ARTHROPLASTY;  Surgeon: [name];  Location: [facility] OR;  Service: Orthopedics;  Laterality: Left;   UPPER GASTROINTESTINAL ENDOSCOPY     Patient Active Problem List   Diagnosis Date Noted   Carpal tunnel syndrome 08/27/2020   Type 2 diabetes mellitus without complication, without long-term current use of insulin (HCC) 05/02/2020   Class 2 obesity due to excess calories with body mass index (BMI) of 35.0 to 35.9 in adult 05/01/2020    HTN (hypertension) 08/17/2019   Frequent headaches 05/15/2019   Hyperlipidemia associated with type 2 diabetes mellitus (HCC) 12/21/2018   History of CVA (cerebrovascular accident) without residual deficits 10/05/2017   Gastroesophageal reflux disease    Insomnia 05/30/2014   Anxiety 05/26/2011    PCP: [name]  REFERRING PROVIDER: Skeet Juliene SAUNDERS, [name]  REFERRING DIAG: M54.9 (ICD-10-CM) - Back pain, unspecified back location, unspecified back pain laterality, unspecified chronicity  Rationale for Evaluation and Treatment: Rehabilitation  THERAPY DIAG:  Cervicalgia  Pain in thoracic spine  Radiculopathy, cervicothoracic region  ONSET DATE: since 2020  SUBJECTIVE:                                                                                                                                                                                           SUBJECTIVE STATEMENT: [name]  Pt reports the stiffness in his upper back persists today, but is getting better.  He has not had any falls or other exacerbating events.  He played golf this weekend without any pain until after the game was over.  PERTINENT HISTORY:  Anxiety, GERD, CVA, headaches, HTN, DM2, CTS s/p bilateral release 2020, left THA 2016, hernia repair x2 2013 & 2018, ACDF C3-C7 2020  PAIN:  Are you having pain? Yes: NPRS scale: 4 Pain location: between shoulder blades Pain description: stiffness Aggravating factors: mornings, nights depending on activity level during the day Relieving factors: trying to stretch  PRECAUTIONS: Fall  RED FLAGS: None   WEIGHT BEARING RESTRICTIONS: No  FALLS:  Has patient fallen in last 6 months? Yes. Number of falls 2-3 - I'll just stumble and fall  LIVING ENVIRONMENT: Lives with: brother Lives in: House/apartment Stairs: No Has following equipment at home: None  OCCUPATION:  Maintenance  PLOF: Independent  PATIENT GOALS: To see if we can get to  where it's hurting  NEXT [name] VISIT: 11/04/2023 [name]  OBJECTIVE:  Note: Objective measures were completed at Evaluation unless otherwise noted.  DIAGNOSTIC FINDINGS:  MRI Cervical 04/20/2022: IMPRESSION: 1. Status post C3-7 fusion. The central canal is open at the operated levels. Mild to moderate foraminal narrowing at C6-7 is more notable on the left. 2. Small focus of chronic myelomalacia in the right hemicord at approximately C5-6.  PATIENT SURVEYS:  Modified Oswestry:  MODIFIED OSWESTRY DISABILITY SCALE  Date: 09/29/2023 Score  Pain intensity 0 = I can tolerate the pain I have without having to use pain medication.  2. Personal care (washing, dressing, etc.) 0 =  I can take care of myself normally without causing increased pain.  3. Lifting 0 = I can lift heavy weights without increased pain.  4. Walking 0 = Pain does not prevent me from walking any distance  5. Sitting 2 =  Pain prevents me from sitting more than 1 hour.  6. Standing 0 =  I can stand as long as I want without increased pain.  7. Sleeping 1 = I can sleep well only by using pain medication.  8. Social Life 0 = My social life is normal and does not increase my pain.  9. Traveling 0 =  I can travel anywhere without increased pain.  10. Employment/ Homemaking 0 = My normal homemaking/job activities [name] not cause pain.  Total 3/50   Interpretation of scores: Score Category Description  0-20% Minimal Disability The patient can cope with most living activities. Usually no treatment is indicated apart from advice on lifting, sitting and exercise  21-40% Moderate Disability The patient experiences more pain and difficulty with sitting, lifting and standing. Travel and social life are more difficult and they may be disabled from work. Personal care, sexual activity and sleeping are not grossly affected, and the patient can usually be managed by conservative means  41-60% Severe Disability Pain remains the  main problem in this group, but activities of daily living are affected. These patients require a detailed investigation  61-80% Crippled Back pain impinges on all aspects of the patient's life. Positive intervention is required  81-100% Bed-bound  These patients are either bed-bound or exaggerating their symptoms  Bluford FORBES Zoe DELENA Karon DELENA, et al. Surgery versus conservative management of stable thoracolumbar fracture: the PRESTO feasibility RCT. [place] ([place]): [employer]; 2021 Nov. [facility] Technology Assessment, No. 25.62.) Appendix 3, Oswestry Disability Index category descriptors. Available from: FindJewel[employer].cz  Minimally Clinically Important Difference (MCID) = 12.8% NDI:  NECK DISABILITY INDEX  Date: 09/29/2023 Score  Pain intensity 2 = The pain is moderate at the moment  2. Personal care (washing, dressing, etc.) 0 = I can look after myself normally without causing extra pain  3. Lifting 0 =  I can lift heavy weights without extra pain  4. Reading 0 = I can read as much as I want to with no pain in my neck  5. Headaches 1 =  I have slight headaches, which come infrequently  6. Concentration 0 =  I can concentrate fully when I want to with no difficulty  7. Work 0 =  I can [name] as much work as I want to  8. Driving 1 =  I can drive my car as long as I want with slight pain in my neck  9. Sleeping 2 = My sleep is  mildly disturbed (1-2 hrs sleepless)  10. Recreation 1 =  I am able to engage in all my recreation activities, with some pain in my neck  Total 7/50   Minimum Detectable Change (90% confidence): 5 points or 10% points  COGNITION: Overall cognitive status: Within functional limits for tasks assessed     SENSATION: Light touch: Impaired   POSTURE: rounded shoulders, forward head, and posterior pelvic tilt  PALPATION: Bilateral T4 area - rhomboids and levator scapulae - noted stretched posture of musculature likely due to  chronic rounding, possible left oriented rhomboid TP, mild kyphosis and flattening of cervical lordosis  THORACIC ROM:   AROM eval  Flexion WFL  Extension 25% loss; good muscle activation symmetrically  Right lateral flexion   Left lateral flexion   Right rotation   Left rotation    (Blank rows = not tested)  LOWER EXTREMITY ROM:     Active  Right eval Left eval  Hip flexion WNL WNL  Hip extension    Hip abduction    Hip adduction    Hip internal rotation    Hip external rotation    Knee flexion    Knee extension    Ankle dorsiflexion    Ankle plantarflexion    Ankle inversion    Ankle eversion     (Blank rows = not tested)  LOWER EXTREMITY MMT:    MMT Right eval Left eval  Hip flexion 5 5  Hip extension    Hip abduction 5 5  Hip adduction    Hip internal rotation    Hip external rotation    Knee flexion 5 5  Knee extension 5 5  Ankle dorsiflexion 5 5  Ankle plantarflexion 5 5  Ankle inversion    Ankle eversion     (Blank rows = not tested)  LUMBAR SPECIAL TESTS:  None relevant to chief complaint.  FUNCTIONAL TESTS:  5 times sit to stand: 11.56 sec no UE support  GAIT: Distance walked: various clinic distances Assistive device utilized: None Level of assistance: Complete Independence Comments: No overt instability, mildly dampened arm swing bilaterally.  TREATMENT DATE: 10/19/2023                                                                                                                              -IASTM to bilateral traps and rhomboids - notable TP in right middle and lower trap region as well as left lower/middle trap (less crackling this visit) -Applied MHP to bilateral shoulder blades x10 minutes to promote relaxation and improved flexibility prior to therex:  -shoulder rolls x10 anterior > x10 posterior  -shoulder hug 2x30 seconds  -side-leaning stretch 3x20 seconds each side  -seated chest opener stretch 2x30  seconds  -Seated cat/cow x10  PATIENT EDUCATION:  Education details:  Continue HEP - he uses Med[employer] Go app.  Encouraged theracane use on TP at home and arm bike in community gym he has access to. Person  educated: Patient Education method: Programmer, multimedia, Demonstration, Verbal cues, and Handouts Education comprehension: verbalized understanding, returned demonstration, verbal cues required, and needs further education  HOME EXERCISE PROGRAM: Access Code: [facility] URL: [email or web address] Date: 10/08/2023 Prepared by: Daved Bull  Exercises - Standing Median Nerve Glide  - 1 x daily - 5 x weekly - 2 sets - 10 reps - Shoulder External Rotation and Scapular Retraction with Resistance  - 1 x daily - 5 x weekly - 3 sets - 10 reps - Thoracic Foam Roll Mobilization Bench Press  - 1 x daily - 5 x weekly - 2 sets - 10 reps - Sidelying Thoracic Lumbar Rotation  - 1 x daily - 5 x weekly - 1-2 sets - 10 reps - Seated Thoracic Extension at Wall  - 1 x daily - 5 x weekly - 1 sets - 5-6 reps - 5-10 seconds hold - Prone Scapular Retraction Arms at Side  - 1 x daily - 5 x weekly - 2 sets - 10 reps - Prone W Scapular Retraction  - 1 x daily - 5 x weekly - 2 sets - 10 reps  ASSESSMENT:  CLINICAL IMPRESSION: Focus of skilled session today on STM techniques and use of heat for trigger point release and general pain and stiffness management.  Ended session with stretching to promote flexibility needed to maintain muscular balance.  He responds well to session with pain improved to 2-3/10 compared to initial 4/10.   He would further benefit from skilled PT to address postural changes impacting pain as well as muscle tension, hypomobility and upper body stability.  Continue per POC.   OBJECTIVE IMPAIRMENTS: decreased activity tolerance, decreased mobility, hypomobility, impaired flexibility, impaired sensation, improper body mechanics, postural dysfunction, and pain.   ACTIVITY  LIMITATIONS: carrying, lifting, and reach over head  PARTICIPATION LIMITATIONS: meal prep, cleaning, laundry, and occupation  PERSONAL FACTORS: Past/current experiences, Profession, Time since onset of injury/illness/exacerbation, and 1-2 comorbidities: CTS s/p release and ACDF C3-C7 are also affecting patient's functional outcome.   REHAB POTENTIAL: Good  CLINICAL DECISION MAKING: Evolving/moderate complexity  EVALUATION COMPLEXITY: Moderate   GOALS: Goals reviewed with patient? Yes  SHORT TERM GOALS: Target date: 10/29/2023  Pt will be independent and compliant with strength, stretching, and mobilization focused HEP in order to maintain functional progress and improve mobility. Baseline:  Initiated on eval Goal status: INITIAL  LONG TERM GOALS: Target date: 11/26/2023  Pt will be independent and compliant with advanced and finalized strength, stretching, and mobilization focused HEP in order to maintain functional progress and improve mobility. Baseline: Initiated on eval Goal status: INITIAL  2.  NDI will improve to </=2/50 in order to demonstrate improved pain management and quality of life. Baseline: 7/50 Goal status: INITIAL  3.  Pt will report average N/T over recent 7 days as </=2-3 in order to demonstrate improved symptom management. Baseline: 4-5 down BUE Goal status: INITIAL  PLAN:  PT FREQUENCY: 1x/week  PT DURATION: 8 weeks  PLANNED INTERVENTIONS: [id number]- PT Re-evaluation, 97750- Physical Performance Testing, 97110-Therapeutic exercises, 97530- Therapeutic activity, [id number]- Neuromuscular re-education, 97535- Self Care, [id number]- Manual therapy, [phone]- Electrical stimulation (manual), Patient/Family education, Joint mobilization, Spinal mobilization, Scar mobilization, Cryotherapy, and Moist heat.  PLAN FOR NEXT SESSION: Expand HEP. thoracic mobilizations, STM/heat to left and right TP as needed, periscapular strength - rows and face pulls and lat pulldowns, nerve  glides - already provided median nerve in standing, review median and provide ulnar, may try grade 3 mob to midline thoracic spine for  extension?  Repeat chirp wheel and spike ball mob to left shoulder and lower cervical regions.  Daved KATHEE Bull, PT, DPT 10/19/2023, 9:34 AM

## 2023-10-27 ENCOUNTER — Encounter: Payer: Self-pay | Admitting: Physical Therapy

## 2023-10-27 ENCOUNTER — Ambulatory Visit: Admitting: Physical Therapy

## 2023-10-27 DIAGNOSIS — M542 Cervicalgia: Secondary | ICD-10-CM | POA: Diagnosis not present

## 2023-10-27 DIAGNOSIS — M5413 Radiculopathy, cervicothoracic region: Secondary | ICD-10-CM

## 2023-10-27 DIAGNOSIS — M546 Pain in thoracic spine: Secondary | ICD-10-CM | POA: Diagnosis not present

## 2023-10-27 NOTE — Therapy (Signed)
 OUTPATIENT PHYSICAL THERAPY THORACOLUMBAR TREATMENT   Patient Name: [name] MRN: [id number] DOB:[birth year], 53 y.o., male Today's Date: 10/27/2023  END OF SESSION:  PT End of Session - 10/27/23 0851     Visit Number 5    Number of Visits 9   8 + eval   Date for Recertification  12/10/23   pushed out due to delay in scheduling   Authorization Type Jolynn Davene Leyden    PT Start Time 9150    PT Stop Time 0929    PT Time Calculation (min) 40 min    Activity Tolerance Patient tolerated treatment well    Behavior During Therapy [facility] for tasks assessed/performed          Past Medical History:  Diagnosis Date   Anxiety 2018   takes Xanax  daily prn, panic attacks   Chronic gastric ulcer with bleeding s/p suture repair 2012 09/29/2010   Head 3 EGD which failed to stop the bleeding. Had exploratory laparotomy on 10/04/2010. Possible cause of the gastric ulcer was NSAID use.    Clotting disorder 2012   Constipation    related to pain meds   Depression    Diabetes mellitus without complication (HCC)    Dizziness    occasionally   Dyspnea    walking a bit   Erosive esophagitis    External hemorrhoid, bleeding    Gastric ulcer    history of   GERD (gastroesophageal reflux disease) 2012   takes Protonix  bid   Hiatal hernia    History of blood clots 2012   in abdomen   History of blood transfusion 2012   Hyperlipidemia    Hypertension 2021   Incisional hernia s/p open repair w mesh Aug 2013 05/26/2011   Joint pain    knees   Nocturia    depends on amount of fluid he drinks   Pneumonia 2018   Pre-diabetes    Primary localized osteoarthritis of left hip 04/24/2014   Serrated polyp of colon    Sleep apnea 2016   no cpap  mild no cpap needed   Stroke (HCC) 2019   Unstable angina (HCC) 04/09/2020   Upper GI bleed    Ventral hernia    Past Surgical History:  Procedure Laterality Date   ANTERIOR CERVICAL DECOMPRESSION/DISCECTOMY FUSION 4 LEVELS N/A 04/08/2018    Procedure: ACDF - C3-C4 - C4-C5 - C5-C6 - C6-C7;  Surgeon: Onetha Kuba, MD;  Location: [facility] OR;  Service: Neurosurgery;  Laterality: N/A;   CARPAL TUNNEL RELEASE Left 04/03/2018   COLONOSCOPY     COLONOSCOPY  07/15/2021   ESOPHAGEAL MANOMETRY N/A 10/19/2016   Procedure: ESOPHAGEAL MANOMETRY (EM);  Surgeon: Shila Gustav GAILS, MD;  Location: [facility] ENDOSCOPY;  Service: Endoscopy;  Laterality: N/A;   ESOPHAGOGASTRODUODENOSCOPY     ESOPHAGOGASTRODUODENOSCOPY N/A 01/22/2017   Procedure: ESOPHAGOGASTRODUODENOSCOPY (EGD);  Surgeon: Sheldon Standing, MD;  Location: [facility] ORS;  Service: General;  Laterality: N/A;   HERNIA REPAIR  2018   INCISIONAL HERNIA REPAIR  09/04/2011   Procedure: HERNIA REPAIR INCISIONAL;  Surgeon: Redell Faith, DO;  Location: [facility] OR;  Service: General;  Laterality: N/A;  debridment calcified mass   INSERTION OF MESH  09/04/2011   retrorectus ultrapro 30x30cm   INSERTION OF MESH N/A 01/22/2017   Procedure: INSERTION OF MESH;  Surgeon: Sheldon Standing, MD;  Location: [facility] ORS;  Service: General;  Laterality: N/A;   JOINT REPLACEMENT  2016   LEFT HEART CATH AND CORONARY ANGIOGRAPHY N/A 04/11/2020   Procedure:  LEFT HEART CATH AND CORONARY ANGIOGRAPHY;  Surgeon: Court Dorn PARAS, MD;  Location: [facility] INVASIVE CV LAB;  Service: Cardiovascular;  Laterality: N/A;   NISSEN FUNDOPLICATION     Open 1990s   POLYPECTOMY     STOMACH SURGERY  10/05/2010   Oversewing of gastric ulcer.  Dr Vanderbilt   TOTAL HIP ARTHROPLASTY Left 04/24/2014   Procedure: LEFT TOTAL HIP ARTHROPLASTY;  Surgeon: Fonda Olmsted, MD;  Location[facility] OR;  Service: Orthopedics;  Laterality: Left;   UPPER GASTROINTESTINAL ENDOSCOPY     Patient Active Problem List   Diagnosis Date Noted   Carpal tunnel syndrome 08/27/2020   Type 2 diabetes mellitus without complication, without long-term current use of insulin (HCC) 05/02/2020   Class 2 obesity due to excess calories with body mass index (BMI) of 35.0 to 35.9 in adult 05/01/2020    HTN (hypertension) 08/17/2019   Frequent headaches 05/15/2019   Hyperlipidemia associated with type 2 diabetes mellitus (HCC) 12/21/2018   History of CVA (cerebrovascular accident) without residual deficits 10/05/2017   Gastroesophageal reflux disease    Insomnia 05/30/2014   Anxiety 05/26/2011    PCP: Antonette Angeline ORN, NP  REFERRING PROVIDER: Skeet Juliene SAUNDERS, DO  REFERRING DIAG: M54.9 (ICD-10-CM) - Back pain, unspecified back location, unspecified back pain laterality, unspecified chronicity  Rationale for Evaluation and Treatment: Rehabilitation  THERAPY DIAG:  Cervicalgia  Pain in thoracic spine  Radiculopathy, cervicothoracic region  ONSET DATE: since 2020  SUBJECTIVE:                                                                                                                                                                                           SUBJECTIVE STATEMENT: [name]  Pt reports his upper back stiffness was pretty bad yesterday as he worked all day and by the end of the day was very stiff.  He tried stretching w/o success.  PERTINENT HISTORY:  Anxiety, GERD, CVA, headaches, HTN, DM2, CTS s/p bilateral release 2020, left THA 2016, hernia repair x2 2013 & 2018, ACDF C3-C7 2020  PAIN:  Are you having pain? Yes: NPRS scale: 4-5 Pain location: bilateral hands (esp fingertips) Pain description: tingling Aggravating factors: using hands Relieving factors: not sure  PRECAUTIONS: Fall  RED FLAGS: None   WEIGHT BEARING RESTRICTIONS: No  FALLS:  Has patient fallen in last 6 months? Yes. Number of falls 2-3 - I'll just stumble and fall  LIVING ENVIRONMENT: Lives with: brother Lives in: House/apartment Stairs: No Has following equipment at home: None  OCCUPATION: [employer]  PLOF: Independent  PATIENT GOALS: To see if we can get to where it's hurting  NEXT MD VISIT: 11/04/2023 Antonette Angeline ORN, NP  OBJECTIVE:  Note: Objective  measures were completed at Evaluation unless otherwise noted.  DIAGNOSTIC FINDINGS:  MRI Cervical 04/20/2022: IMPRESSION: 1. Status post C3-7 fusion. The central canal is open at the operated levels. Mild to moderate foraminal narrowing at C6-7 is more notable on the left. 2. Small focus of chronic myelomalacia in the right hemicord at approximately C5-6.  PATIENT SURVEYS:  Modified Oswestry:  MODIFIED OSWESTRY DISABILITY SCALE  Date: 09/29/2023 Score  Pain intensity 0 = I can tolerate the pain I have without having to use pain medication.  2. Personal care (washing, dressing, etc.) 0 =  I can take care of myself normally without causing increased pain.  3. Lifting 0 = I can lift heavy weights without increased pain.  4. Walking 0 = Pain does not prevent me from walking any distance  5. Sitting 2 =  Pain prevents me from sitting more than 1 hour.  6. Standing 0 =  I can stand as long as I want without increased pain.  7. Sleeping 1 = I can sleep well only by using pain medication.  8. Social Life 0 = My social life is normal and does not increase my pain.  9. Traveling 0 =  I can travel anywhere without increased pain.  10. Employment/ Homemaking 0 = My normal homemaking/job activities do not cause pain.  Total 3/50   Interpretation of scores: Score Category Description  0-20% Minimal Disability The patient can cope with most living activities. Usually no treatment is indicated apart from advice on lifting, sitting and exercise  21-40% Moderate Disability The patient experiences more pain and difficulty with sitting, lifting and standing. Travel and social life are more difficult and they may be disabled from work. Personal care, sexual activity and sleeping are not grossly affected, and the patient can usually be managed by conservative means  41-60% Severe Disability Pain remains the main problem in this group, but activities of daily living are affected. These patients require a  detailed investigation  61-80% Crippled Back pain impinges on all aspects of the patient's life. Positive intervention is required  81-100% Bed-bound  These patients are either bed-bound or exaggerating their symptoms  Bluford FORBES Zoe DELENA Karon DELENA, et al. Surgery versus conservative management of stable thoracolumbar fracture: the PRESTO feasibility RCT. [place]): [employer]; 2021 Nov. [facility] Technology Assessment, No. 25.62.) Appendix 3, Oswestry Disability Index category descriptors. Available from: FindJewelers.cz  Minimally Clinically Important Difference (MCID) = 12.8% NDI:  NECK DISABILITY INDEX  Date: 09/29/2023 Score  Pain intensity 2 = The pain is moderate at the moment  2. Personal care (washing, dressing, etc.) 0 = I can look after myself normally without causing extra pain  3. Lifting 0 =  I can lift heavy weights without extra pain  4. Reading 0 = I can read as much as I want to with no pain in my neck  5. Headaches 1 =  I have slight headaches, which come infrequently  6. Concentration 0 =  I can concentrate fully when I want to with no difficulty  7. Work 0 =  I can do as much work as I want to  8. Driving 1 =  I can drive my car as long as I want with slight pain in my neck  9. Sleeping 2 = My sleep is mildly disturbed (1-2 hrs sleepless)  10. Recreation 1 =  I am able to engage  in all my recreation activities, with some pain in my neck  Total 7/50   Minimum Detectable Change (90% confidence): 5 points or 10% points  COGNITION: Overall cognitive status: Within functional limits for tasks assessed     SENSATION: Light touch: Impaired   POSTURE: rounded shoulders, forward head, and posterior pelvic tilt  PALPATION: Bilateral T4 area - rhomboids and levator scapulae - noted stretched posture of musculature likely due to chronic rounding, possible left oriented rhomboid TP, mild kyphosis and flattening of cervical  lordosis  THORACIC ROM:   AROM eval  Flexion WFL  Extension 25% loss; good muscle activation symmetrically  Right lateral flexion   Left lateral flexion   Right rotation   Left rotation    (Blank rows = not tested)  LOWER EXTREMITY ROM:     Active  Right eval Left eval  Hip flexion WNL WNL  Hip extension    Hip abduction    Hip adduction    Hip internal rotation    Hip external rotation    Knee flexion    Knee extension    Ankle dorsiflexion    Ankle plantarflexion    Ankle inversion    Ankle eversion     (Blank rows = not tested)  LOWER EXTREMITY MMT:    MMT Right eval Left eval  Hip flexion 5 5  Hip extension    Hip abduction 5 5  Hip adduction    Hip internal rotation    Hip external rotation    Knee flexion 5 5  Knee extension 5 5  Ankle dorsiflexion 5 5  Ankle plantarflexion 5 5  Ankle inversion    Ankle eversion     (Blank rows = not tested)  LUMBAR SPECIAL TESTS:  None relevant to chief complaint.  FUNCTIONAL TESTS:  5 times sit to stand: 11.56 sec no UE support  GAIT: Distance walked: various clinic distances Assistive device utilized: None Level of assistance: Complete Independence Comments: No overt instability, mildly dampened arm swing bilaterally.  TREATMENT DATE: 10/27/2023                                                                                                                              -Grade 3/4 thoracic (T3-T10) mobilization, global gentle rib mobilization bilaterally (Grade 2-3) for pain and gentle stretch - pt reports relief during mob; spike ball STM and TPR to bilateral rhomboids/middle trap where TP worse on right w/ noted crackling -Lat pulldowns 2x15 > face pulls 2x15 > rows 2x15 w/ green theraband (provided additional blue for home)  PATIENT EDUCATION:  Education details:  Continue HEP - he uses MedBridge Go app.  Encouraged theracane use on TP at home and having second person assist with STM to  mid-traps and rhomboids to better manage TP and arm bike in community gym he has access to. Person educated: Patient Education method: Explanation, Demonstration, Verbal cues, and Handouts Education comprehension: verbalized understanding, returned demonstration, verbal  cues required, and needs further education  HOME EXERCISE PROGRAM: Access Code: [facility] URL: [email or web address] Date: 10/08/2023 Prepared by: Daved Bull  Exercises - Standing Median Nerve Glide  - 1 x daily - 5 x weekly - 2 sets - 10 reps - Shoulder External Rotation and Scapular Retraction with Resistance  - 1 x daily - 5 x weekly - 3 sets - 10 reps - Thoracic Foam Roll Mobilization Bench Press  - 1 x daily - 5 x weekly - 2 sets - 10 reps - Sidelying Thoracic Lumbar Rotation  - 1 x daily - 5 x weekly - 1-2 sets - 10 reps - Seated Thoracic Extension at Wall  - 1 x daily - 5 x weekly - 1 sets - 5-6 reps - 5-10 seconds hold - Prone Scapular Retraction Arms at Side  - 1 x daily - 5 x weekly - 2 sets - 10 reps - Prone W Scapular Retraction  - 1 x daily - 5 x weekly - 2 sets - 10 reps - Standing Lat Pull Down with Resistance - Elbows Bent  - 1 x daily - 5 x weekly - 2-3 sets - 10 reps - Face Pulls  - 1 x daily - 5 x weekly - 2-3 sets - 10 reps - Standing Shoulder Row with Anchored Resistance  - 1 x daily - 5 x weekly - 2-3 sets - 10 reps  ASSESSMENT:  CLINICAL IMPRESSION: Time spent addressing spinal mobility with grade 3 PA thoracic mobilizations and gentle rib pressure to improve flexibility w/ reported relief.  He would benefit from having someone assist with TPR at home to better manage periscapular areas of pain.  He continues to have prominent rhomboids/middle trap TP bilaterally, but right worse.  Added periscapular strengthening to his HEP to support STM work completed today.  Continue per POC.   OBJECTIVE IMPAIRMENTS: decreased activity tolerance, decreased mobility, hypomobility, impaired  flexibility, impaired sensation, improper body mechanics, postural dysfunction, and pain.   ACTIVITY LIMITATIONS: carrying, lifting, and reach over head  PARTICIPATION LIMITATIONS: meal prep, cleaning, laundry, and occupation  PERSONAL FACTORS: Past/current experiences, Profession, Time since onset of injury/illness/exacerbation, and 1-2 comorbidities: CTS s/p release and ACDF C3-C7 are also affecting patient's functional outcome.   REHAB POTENTIAL: Good  CLINICAL DECISION MAKING: Evolving/moderate complexity  EVALUATION COMPLEXITY: Moderate   GOALS: Goals reviewed with patient? Yes  SHORT TERM GOALS: Target date: 10/29/2023  Pt will be independent and compliant with strength, stretching, and mobilization focused HEP in order to maintain functional progress and improve mobility. Baseline:  Initiated on eval Goal status: INITIAL  LONG TERM GOALS: Target date: 11/26/2023  Pt will be independent and compliant with advanced and finalized strength, stretching, and mobilization focused HEP in order to maintain functional progress and improve mobility. Baseline: Initiated on eval Goal status: INITIAL  2.  NDI will improve to </=2/50 in order to demonstrate improved pain management and quality of life. Baseline: 7/50 Goal status: INITIAL  3.  Pt will report average N/T over recent 7 days as </=2-3 in order to demonstrate improved symptom management. Baseline: 4-5 down BUE Goal status: INITIAL  PLAN:  PT FREQUENCY: 1x/week  PT DURATION: 8 weeks  PLANNED INTERVENTIONS: 97164- PT Re-evaluation, 97750- Physical Performance Testing, 97110-Therapeutic exercises, 97530- Therapeutic activity, [id number]- Neuromuscular re-education, 97535- Self Care, [id number]- Manual therapy, [phone]- Electrical stimulation (manual), Patient/Family education, Joint mobilization, Spinal mobilization, Scar mobilization, Cryotherapy, and Moist heat.  PLAN FOR NEXT SESSION: Expand HEP. thoracic mobilizations, STM/heat  to left  and right TP as needed, nerve glides - already provided median nerve in standing, review median and provide ulnar, may try grade 3 mob to midline thoracic spine for extension?  Repeat chirp wheel and spike ball mob to left shoulder and lower cervical regions.  Daved KATHEE Bull, PT, DPT 10/27/2023, 12:42 PM

## 2023-10-27 NOTE — Patient Instructions (Signed)
 Access Code: [facility] URL: [email or web address] Date: 10/08/2023 Prepared by: Daved Bull  Exercises - Standing Median Nerve Glide  - 1 x daily - 5 x weekly - 2 sets - 10 reps - Shoulder External Rotation and Scapular Retraction with Resistance  - 1 x daily - 5 x weekly - 3 sets - 10 reps - Thoracic Foam Roll Mobilization Bench Press  - 1 x daily - 5 x weekly - 2 sets - 10 reps - Sidelying Thoracic Lumbar Rotation  - 1 x daily - 5 x weekly - 1-2 sets - 10 reps - Seated Thoracic Extension at Wall  - 1 x daily - 5 x weekly - 1 sets - 5-6 reps - 5-10 seconds hold - Prone Scapular Retraction Arms at Side  - 1 x daily - 5 x weekly - 2 sets - 10 reps - Prone W Scapular Retraction  - 1 x daily - 5 x weekly - 2 sets - 10 reps - Standing Lat Pull Down with Resistance - Elbows Bent  - 1 x daily - 5 x weekly - 2-3 sets - 10 reps - Face Pulls  - 1 x daily - 5 x weekly - 2-3 sets - 10 reps - Standing Shoulder Row with Anchored Resistance  - 1 x daily - 5 x weekly - 2-3 sets - 10 reps

## 2023-10-28 ENCOUNTER — Other Ambulatory Visit: Payer: Self-pay | Admitting: Internal Medicine

## 2023-10-29 ENCOUNTER — Other Ambulatory Visit: Payer: Self-pay

## 2023-10-29 MED ORDER — CYCLOBENZAPRINE HCL 10 MG PO TABS
10.0000 mg | ORAL_TABLET | Freq: Two times a day (BID) | ORAL | 0 refills | Status: DC | PRN
  Filled 2023-10-29: qty 60, 30d supply, fill #0

## 2023-10-29 NOTE — Telephone Encounter (Signed)
 Requested medications are due for refill today.  yes  Requested medications are on the active medications list.  yes  Last refill. 09/28/2023 #60 0 rf  Future visit scheduled.   yes  Notes to clinic.  Refill not delegated    Requested Prescriptions  Pending Prescriptions Disp Refills   cyclobenzaprine  (FLEXERIL ) 10 MG tablet 60 tablet 0    Sig: Take 1 tablet (10 mg total) by mouth 2 (two) times daily as needed.     Not Delegated - Analgesics:  Muscle Relaxants Failed - 10/29/2023  2:17 PM      Failed - This refill cannot be delegated      Passed - Valid encounter within last 6 months    Recent Outpatient Visits           5 months ago Encounter for general adult medical examination with abnormal findings   [facility] [place], Angeline ORN, NP

## 2023-11-03 ENCOUNTER — Ambulatory Visit: Admitting: Occupational Therapy

## 2023-11-03 ENCOUNTER — Ambulatory Visit

## 2023-11-04 ENCOUNTER — Ambulatory Visit: Admitting: Internal Medicine

## 2023-11-04 VITALS — BP 134/84 | Ht 69.0 in | Wt 231.8 lb

## 2023-11-04 DIAGNOSIS — E66811 Obesity, class 1: Secondary | ICD-10-CM

## 2023-11-04 DIAGNOSIS — E1159 Type 2 diabetes mellitus with other circulatory complications: Secondary | ICD-10-CM | POA: Diagnosis not present

## 2023-11-04 DIAGNOSIS — E785 Hyperlipidemia, unspecified: Secondary | ICD-10-CM

## 2023-11-04 DIAGNOSIS — E1169 Type 2 diabetes mellitus with other specified complication: Secondary | ICD-10-CM

## 2023-11-04 DIAGNOSIS — E119 Type 2 diabetes mellitus without complications: Secondary | ICD-10-CM

## 2023-11-04 DIAGNOSIS — F5101 Primary insomnia: Secondary | ICD-10-CM

## 2023-11-04 DIAGNOSIS — Z23 Encounter for immunization: Secondary | ICD-10-CM

## 2023-11-04 DIAGNOSIS — Z8673 Personal history of transient ischemic attack (TIA), and cerebral infarction without residual deficits: Secondary | ICD-10-CM

## 2023-11-04 DIAGNOSIS — F419 Anxiety disorder, unspecified: Secondary | ICD-10-CM | POA: Diagnosis not present

## 2023-11-04 DIAGNOSIS — K219 Gastro-esophageal reflux disease without esophagitis: Secondary | ICD-10-CM

## 2023-11-04 DIAGNOSIS — R519 Headache, unspecified: Secondary | ICD-10-CM

## 2023-11-04 DIAGNOSIS — G5603 Carpal tunnel syndrome, bilateral upper limbs: Secondary | ICD-10-CM

## 2023-11-04 DIAGNOSIS — I152 Hypertension secondary to endocrine disorders: Secondary | ICD-10-CM

## 2023-11-04 MED ORDER — PANTOPRAZOLE SODIUM 40 MG PO TBEC: 40.0000 mg | DELAYED_RELEASE_TABLET | Freq: Every day | ORAL | 0 refills | Status: DC

## 2023-11-04 NOTE — Assessment & Plan Note (Signed)
 Continue ambien  12.5 mg CR at bedtime Will monitor

## 2023-11-04 NOTE — Assessment & Plan Note (Signed)
 Controlled on amlodipine  10 mg daily, lisinopril  10 mg daily and carvedilol  3.125 mg twice daily Reinforced DASH diet and exercise for weight loss C-Met today

## 2023-11-04 NOTE — Assessment & Plan Note (Signed)
 No residual effect C-Met lipid profile today Continue amlodipine  10 mg daily, lisinopril  10 mg daily, carvedilol  3.125 mg [name], aspirin  81 mg daily and rosuvastatin  5 mg daily He will continue to follow with neurology

## 2023-11-04 NOTE — Assessment & Plan Note (Signed)
 A1c and urine microalbumin today Not medicated Encouraged to consume a low-carb diet and exercise for weight loss Will request copy of eye exam Encouraged routine foot exam Flu and Prevnar 20 today Pneumovax UTD

## 2023-11-04 NOTE — Assessment & Plan Note (Signed)
 Okay to take tylenol OTC as needed

## 2023-11-04 NOTE — Assessment & Plan Note (Signed)
 Continue cyclobenzaprine  10 mg twice daily and gabapentin  800 mg 3 times daily as prescribed He will continue to follow with neurology

## 2023-11-04 NOTE — Assessment & Plan Note (Signed)
 Continue clonazepam  0.5 mg daily as needed Support offered

## 2023-11-04 NOTE — Assessment & Plan Note (Signed)
 Encourage diet and exercise for weight loss

## 2023-11-04 NOTE — Assessment & Plan Note (Signed)
 C-Met and lipid profile today  encouraged to consume a low-fat diet Continue rosuvastatin  5 mg daily

## 2023-11-04 NOTE — Patient Instructions (Signed)

## 2023-11-04 NOTE — Assessment & Plan Note (Signed)
 Avoid foods that trigger reflux Encourage weight loss as this can help reduce reflux symptoms Will decrease pantoprazole  to 40 mg daily

## 2023-11-04 NOTE — Progress Notes (Signed)
 Subjective:    Patient ID: [name], male    DOB: 17-Nov-1970, 53 y.o.   MRN: [id number]  HPI  Patient presents the clinic today for follow-up of chronic conditions.  HTN: His BP today is 134/84.  He is taking amlodipine , lisinopril  and carvedilol  as prescribed.  ECG from 05/2022 reviewed.  HLD with history of stroke: No residual effect.  His last LDL was 64, triglycerides 75, 04/2023.  He denies myalgias on rosuvastatin .  He is taking aspirin , amlodipine , lisinopril  and carvedilol  as prescribed.  He tries to consume a low-fat diet.  He follows with neurology.  GERD: Triggered by tomato based foods and spicy foods.  He denies breakthrough on pantoprazole .  Upper GI from 09/2016 reviewed.  DM2: His last A1c was 6.4 %, 04/2023.  He is not taking any diabetic medication at this time.  He does not routinely check his sugars unless he does not feel well.  He checks his feet routinely.  His last eye exam was 12/2022, [facility].  Flu 10/2022.  Pneumovax 04/2021.  Prevnar: Never.  COVID Pfizer x 3.  Anxiety: Chronic, managed on clonazepam  as needed.  He is not currently seeing a therapist.  He denies depression, SI/HI.  Insomnia: He has difficulty falling and staying asleep.  He is taking ambien  as prescribed.  Sleep study from 08/2014 reviewed.  Frequent headaches: These occur a few times per month.  He is not sure what triggers them.  He no longer takes topiramate .  He follows with neurology.  Bilateral carpal tunnel: Status post surgery.  He takes cyclobenzaprine  and gabapentin  as needed with some relief of symptoms. He is currently in PT.  He follows with neurology.  Review of Systems     Past Medical History:  Diagnosis Date   Anxiety 2018   takes Xanax  daily prn, panic attacks   Chronic gastric ulcer with bleeding s/p suture repair 2012 09/29/2010   Head 3 EGD which failed to stop the bleeding. Had exploratory laparotomy on 10/04/2010. Possible cause of the gastric ulcer was NSAID  use.    Clotting disorder 2012   Constipation    related to pain meds   Depression    Diabetes mellitus without complication (HCC)    Dizziness    occasionally   Dyspnea    walking a bit   Erosive esophagitis    External hemorrhoid, bleeding    Gastric ulcer    history of   GERD (gastroesophageal reflux disease) 2012   takes Protonix  bid   Hiatal hernia    History of blood clots 2012   in abdomen   History of blood transfusion 2012   Hyperlipidemia    Hypertension 2021   Incisional hernia s/p open repair w mesh Aug 2013 05/26/2011   Joint pain    knees   Nocturia    depends on amount of fluid he drinks   Pneumonia 2018   Pre-diabetes    Primary localized osteoarthritis of left hip 04/24/2014   Serrated polyp of colon    Sleep apnea 2016   no cpap  mild no cpap needed   Stroke [facility]) 2019   Unstable angina (HCC) 04/09/2020   Upper GI bleed    Ventral hernia     Current Outpatient Medications  Medication Sig Dispense Refill   albuterol  (VENTOLIN  HFA) 108 (90 Base) MCG/ACT inhaler Inhale 1-2 puffs into the lungs every 4 (four) hours as needed for wheezing or shortness of breath 6.7 g 2  amLODipine  (NORVASC ) 10 MG tablet Take 1 tablet (10 mg total) by mouth daily. Please call our to schedule an overdue appointment with Dr. Hobart before anymore refills. [phone]. Thank you. 3rd attempt. 90 tablet 0   aspirin  EC 81 MG EC tablet Take 1 tablet (81 mg total) by mouth daily.     carvedilol  (COREG ) 3.125 MG tablet Take 1 tablet (3.125 mg total) by mouth 2 (two) times daily with a meal. 180 tablet 0   clonazePAM  (KLONOPIN ) 0.5 MG tablet Take 1 tablet (0.5 mg total) by mouth at bedtime. 30 tablet 0   cyclobenzaprine  (FLEXERIL ) 10 MG tablet Take 1 tablet (10 mg total) by mouth 2 (two) times daily as needed. 60 tablet 0   fluticasone  (FLONASE ) 50 MCG/ACT nasal spray Place 2 sprays into both nostrils daily as needed for allergies. 16 g 11   gabapentin  (NEURONTIN ) 800 MG tablet  Take 1 tablet (800 mg total) by mouth 3 (three) times daily. 90 tablet 5   ipratropium (ATROVENT ) 0.03 % nasal spray Place 2 sprays into both nostrils every 12 (twelve) hours. 30 mL 0   lisinopril  (ZESTRIL ) 10 MG tablet Take 1 tablet (10 mg total) by mouth daily. 90 tablet 1   ondansetron  (ZOFRAN ) 4 MG tablet Take 1 tablet (4 mg total) by mouth every 8 (eight) hours as needed for nausea or vomiting. 20 tablet 1   pantoprazole  (PROTONIX ) 40 MG tablet Take 1 tablet (40 mg total) by mouth 2 (two) times daily. 180 tablet 0   rosuvastatin  (CRESTOR ) 5 MG tablet Take 1 tablet (5 mg total) by mouth daily. 90 tablet 3   zolpidem  (AMBIEN  CR) 12.5 MG CR tablet Take 1 tablet (12.5 mg total) by mouth at bedtime. 30 tablet 0   No current facility-administered medications for this visit.    Allergies  Allergen Reactions   Dilaudid  [Hydromorphone  Hcl] Rash and Other (See Comments)    Shakey   Hydromorphone  Hcl Other (See Comments) and Rash    Shakey   Metoclopramide  Other (See Comments), Rash, Swelling and Hives    Swelling in lips   Metoclopramide  Hcl Hives, Swelling, Rash and Other (See Comments)    Swelling in lips   Nsaids     H/o bleeding gastric ulcers   Benadryl  [Diphenhydramine ]     Causes patient to become hyper and pace    Family History  Problem Relation Age of Onset   Diabetes Mother    Liver disease Mother    Diabetes Father    Heart disease Father    Healthy Sister    Healthy Brother    Healthy Brother    Colon cancer Neg Hx    Stomach cancer Neg Hx    Rectal cancer Neg Hx    Esophageal cancer Neg Hx    Colon polyps Neg Hx    Prostate cancer Neg Hx     Social History   Socioeconomic History   Marital status: Single    Spouse name: Not on file   Number of children: 1   Years of education: 12   Highest education level: 12th grade  Occupational History   Occupation: Maintenance - [employer]  Tobacco Use   Smoking status: Some Days   Smokeless tobacco: Current     Types: Chew   Tobacco comments:    occ-Chew    None on 07/15/21  Vaping Use   Vaping status: Never Used  Substance and Sexual Activity   Alcohol use: Not Currently  Comment: 09/04/11 very seldom   Drug use: Never   Sexual activity: Yes    Birth control/protection: None  Other Topics Concern   Not on file  Social History Narrative   Lives with brother in a one story home.  Has one daughter.  Works in Production designer, theatre/television/film for [employer].  Education: high school.       Patient is left-handed.   Social Drivers of Corporate investment banker Strain: Low Risk  (05/03/2023)   Overall Financial Resource Strain (CARDIA)    Difficulty of Paying Living Expenses: Not very hard  Food Insecurity: Food Insecurity Present (05/03/2023)   Hunger Vital Sign    Worried About Running Out of Food in the Last Year: Sometimes true    Ran Out of Food in the Last Year: Sometimes true  Transportation Needs: No Transportation Needs (05/03/2023)   PRAPARE - Administrator, Civil Service (Medical): No    Lack of Transportation (Non-Medical): No  Physical Activity: Sufficiently Active (05/03/2023)   Exercise Vital Sign    Days of Exercise per Week: 5 days    Minutes of Exercise per Session: 60 min  Stress: No Stress Concern Present (05/03/2023)   [employer] of Occupational Health - Occupational Stress Questionnaire    Feeling of Stress : Only a little  Social Connections: Moderately Isolated (05/03/2023)   Social Connection and Isolation Panel    Frequency of Communication with Friends and Family: More than three times a week    Frequency of Social Gatherings with Friends and Family: Once a week    Attends Religious Services: 1 to 4 times per year    Active Member of [employer] or Organizations: No    Attends Engineer, structural: Not on file    Marital Status: Divorced  Intimate Partner Violence: Not on file     Constitutional: Patient reports intermittent headaches.  Denies fever,  malaise, fatigue, or abrupt weight changes.  HEENT: Denies eye pain, eye redness, ear pain, ringing in the ears, wax buildup, runny nose, nasal congestion, bloody nose, or sore throat. Respiratory: Denies difficulty breathing, shortness of breath, cough or sputum production.   Cardiovascular: Denies chest pain, chest tightness, palpitations or swelling in the hands or feet.  Gastrointestinal: Denies abdominal pain, bloating, constipation, diarrhea or blood in the stool.  GU: Denies urgency, frequency, pain with urination, burning sensation, blood in urine, odor or discharge. Musculoskeletal: Denies decrease in range of motion, difficulty with gait, muscle pain or joint pain and swelling.  Skin: Denies redness, rashes, lesions or ulcercations.  Neurological: Patient reports insomnia, paresthesia of upper extremities.  Denies dizziness, difficulty with memory, difficulty with speech or problems with balance and coordination.  Psych: Patient has a history of anxiety.  Denies depression, SI/HI.  No other specific complaints in a complete review of systems (except as listed in HPI above).  Objective:   Physical Exam BP 134/84 (BP Location: Right Arm, Patient Position: Sitting, Cuff Size: Normal)   Ht 5' 9 (1.753 m)   Wt 231 lb 12.8 oz (105.1 kg)   BMI 34.23 kg/m    Wt Readings from Last 3 Encounters:  09/06/23 229 lb (103.9 kg)  05/03/23 225 lb 9.6 oz (102.3 kg)  03/08/23 209 lb (94.8 kg)    General: Appears his stated age, obese, in NAD. Skin: Warm, dry and intact. No ulcerations noted. HEENT: Head: normal shape and size; Eyes: sclera white, no icterus, conjunctiva pink, PERRLA and EOMs intact;  Cardiovascular: Normal  rate and rhythm. S1,S2 noted.  No murmur, rubs or gallops noted. No JVD or BLE edema. No carotid bruits noted. Pulmonary/Chest: Normal effort and positive vesicular breath sounds. No respiratory distress. No wheezes, rales or ronchi noted.  Abdomen: Soft and nontender.  Normal bowel sounds.  Musculoskeletal: No difficulty with gait.  Neurological: Alert and oriented. Coordination normal.  Psychiatric: Mood and affect normal. Behavior is normal. Judgment and thought content normal.    BMET    Component Value Date/Time   NA 137 05/03/2023 1011   NA 142 04/26/2020 0849   K 4.8 05/03/2023 1011   CL 101 05/03/2023 1011   CO2 26 05/03/2023 1011   GLUCOSE 127 (H) 05/03/2023 1011   BUN 17 05/03/2023 1011   BUN 18 04/26/2020 0849   CREATININE 1.10 05/03/2023 1011   CALCIUM  10.2 05/03/2023 1011   GFRNONAA >60 04/11/2020 0320   GFRAA 96 12/12/2019 1134    Lipid Panel     Component Value Date/Time   CHOL 152 05/03/2023 1011   CHOL 112 11/28/2020 0746   TRIG 75 05/03/2023 1011   HDL 72 05/03/2023 1011   HDL 60 11/28/2020 0746   CHOLHDL 2.1 05/03/2023 1011   VLDL 32 04/10/2020 0402   LDLCALC 64 05/03/2023 1011    CBC    Component Value Date/Time   WBC 8.9 05/03/2023 1011   RBC 5.06 05/03/2023 1011   HGB 15.4 05/03/2023 1011   HCT 46.6 05/03/2023 1011   PLT 358 05/03/2023 1011   MCV 92.1 05/03/2023 1011   MCH 30.4 05/03/2023 1011   MCHC 33.0 05/03/2023 1011   RDW 13.0 05/03/2023 1011   LYMPHSABS 2,842.8 05/06/2022 1601   MONOABS 1.0 04/09/2020 1553   EOSABS 147 05/06/2022 1601   BASOSABS 37 05/06/2022 1601    Hgb A1C Lab Results  Component Value Date   HGBA1C 6.4 (H) 05/03/2023           Assessment & Plan:     RTC in 6 months for your annual exam Angeline Laura, NP

## 2023-11-05 ENCOUNTER — Ambulatory Visit: Payer: Self-pay | Admitting: Internal Medicine

## 2023-11-05 LAB — COMPREHENSIVE METABOLIC PANEL WITH GFR
AG Ratio: 1.8 (calc) (ref 1.0–2.5)
ALT: 31 U/L (ref 9–46)
AST: 23 U/L (ref 10–35)
Albumin: 4.6 g/dL (ref 3.6–5.1)
Alkaline phosphatase (APISO): 55 U/L (ref 35–144)
BUN: 18 mg/dL (ref 7–25)
CO2: 29 mmol/L (ref 20–32)
Calcium: 10.2 mg/dL (ref 8.6–10.3)
Chloride: 103 mmol/L (ref 98–110)
Creat: 0.87 mg/dL (ref 0.70–1.30)
Globulin: 2.5 g/dL (ref 1.9–3.7)
Glucose, Bld: 108 mg/dL (ref 65–139)
Potassium: 5.3 mmol/L (ref 3.5–5.3)
Sodium: 140 mmol/L (ref 135–146)
Total Bilirubin: 0.3 mg/dL (ref 0.2–1.2)
Total Protein: 7.1 g/dL (ref 6.1–8.1)
eGFR: 103 mL/min/1.73m2 (ref >=60)

## 2023-11-05 LAB — MICROALBUMIN / CREATININE URINE RATIO
Creatinine, Urine: 89 mg/dL (ref 20–320)
Microalb Creat Ratio: 7 mg/g{creat} (ref <30)
Microalb, Ur: 0.6 mg/dL

## 2023-11-05 LAB — LIPID PANEL
Cholesterol: 140 mg/dL (ref <200)
HDL: 66 mg/dL (ref >=40)
LDL Cholesterol (Calc): 55 mg/dL
Non-HDL Cholesterol (Calc): 74 mg/dL (ref <130)
Total CHOL/HDL Ratio: 2.1 (calc) (ref <5.0)
Triglycerides: 105 mg/dL (ref <150)

## 2023-11-05 LAB — HEMOGLOBIN A1C
Hgb A1c MFr Bld: 6.5 % — ABNORMAL HIGH (ref <5.7)
Mean Plasma Glucose: 140 mg/dL
eAG (mmol/L): 7.7 mmol/L

## 2023-11-05 LAB — CBC
HCT: 43.9 % (ref 38.5–50.0)
Hemoglobin: 14.1 g/dL (ref 13.2–17.1)
MCH: 30.1 pg (ref 27.0–33.0)
MCHC: 32.1 g/dL (ref 32.0–36.0)
MCV: 93.8 fL (ref 80.0–100.0)
MPV: 10.5 fL (ref 7.5–12.5)
Platelets: 315 Thousand/uL (ref 140–400)
RBC: 4.68 Million/uL (ref 4.20–5.80)
RDW: 13 % (ref 11.0–15.0)
WBC: 7 Thousand/uL (ref 3.8–10.8)

## 2023-11-09 ENCOUNTER — Other Ambulatory Visit: Payer: Self-pay

## 2023-11-09 ENCOUNTER — Other Ambulatory Visit: Payer: Self-pay | Admitting: Internal Medicine

## 2023-11-09 DIAGNOSIS — F5101 Primary insomnia: Secondary | ICD-10-CM

## 2023-11-09 DIAGNOSIS — F419 Anxiety disorder, unspecified: Secondary | ICD-10-CM

## 2023-11-10 ENCOUNTER — Ambulatory Visit: Admitting: Occupational Therapy

## 2023-11-10 ENCOUNTER — Ambulatory Visit: Attending: Neurology | Admitting: Physical Therapy

## 2023-11-10 ENCOUNTER — Encounter: Payer: Self-pay | Admitting: Physical Therapy

## 2023-11-10 DIAGNOSIS — R29818 Other symptoms and signs involving the nervous system: Secondary | ICD-10-CM

## 2023-11-10 DIAGNOSIS — R278 Other lack of coordination: Secondary | ICD-10-CM

## 2023-11-10 DIAGNOSIS — R29898 Other symptoms and signs involving the musculoskeletal system: Secondary | ICD-10-CM | POA: Insufficient documentation

## 2023-11-10 DIAGNOSIS — R208 Other disturbances of skin sensation: Secondary | ICD-10-CM | POA: Diagnosis not present

## 2023-11-10 DIAGNOSIS — M546 Pain in thoracic spine: Secondary | ICD-10-CM | POA: Diagnosis not present

## 2023-11-10 DIAGNOSIS — M5413 Radiculopathy, cervicothoracic region: Secondary | ICD-10-CM | POA: Diagnosis not present

## 2023-11-10 DIAGNOSIS — M542 Cervicalgia: Secondary | ICD-10-CM | POA: Diagnosis not present

## 2023-11-10 NOTE — Therapy (Signed)
 OUTPATIENT PHYSICAL THERAPY THORACOLUMBAR TREATMENT   Patient Name: [name] MRN: [id number] DOB:[birth year], 53 y.o., male Today's Date: 11/10/2023  END OF SESSION:  PT End of Session - 11/10/23 0933     Visit Number 6    Number of Visits 9   8 + eval   Date for Recertification  12/10/23   pushed out due to delay in scheduling   Authorization Type Jolynn Davene Leyden    PT Start Time 9068    PT Stop Time 1013    PT Time Calculation (min) 42 min    Activity Tolerance Patient tolerated treatment well    Behavior During Therapy [facility] for tasks assessed/performed          Past Medical History:  Diagnosis Date   Anxiety 2018   takes Xanax  daily prn, panic attacks   Chronic gastric ulcer with bleeding s/p suture repair 2012 09/29/2010   Head 3 EGD which failed to stop the bleeding. Had exploratory laparotomy on 10/04/2010. Possible cause of the gastric ulcer was NSAID use.    Clotting disorder 2012   Constipation    related to pain meds   Depression    Diabetes mellitus without complication (HCC)    Dizziness    occasionally   Dyspnea    walking a bit   Erosive esophagitis    External hemorrhoid, bleeding    Gastric ulcer    history of   GERD (gastroesophageal reflux disease) 2012   takes Protonix  bid   Hiatal hernia    History of blood clots 2012   in abdomen   History of blood transfusion 2012   Hyperlipidemia    Hypertension 2021   Incisional hernia s/p open repair w mesh Aug 2013 05/26/2011   Joint pain    knees   Nocturia    depends on amount of fluid he drinks   Pneumonia 2018   Pre-diabetes    Primary localized osteoarthritis of left hip 04/24/2014   Serrated polyp of colon    Sleep apnea 2016   no cpap  mild no cpap needed   Stroke (HCC) 2019   Unstable angina (HCC) 04/09/2020   Upper GI bleed    Ventral hernia    Past Surgical History:  Procedure Laterality Date   ANTERIOR CERVICAL DECOMPRESSION/DISCECTOMY FUSION 4 LEVELS N/A 04/08/2018    Procedure: ACDF - C3-C4 - C4-C5 - C5-C6 - C6-C7;  Surgeon: Onetha Kuba, MD;  Location: [facility] OR;  Service: Neurosurgery;  Laterality: N/A;   CARPAL TUNNEL RELEASE Left 04/03/2018   COLONOSCOPY     COLONOSCOPY  07/15/2021   ESOPHAGEAL MANOMETRY N/A 10/19/2016   Procedure: ESOPHAGEAL MANOMETRY (EM);  Surgeon: Shila Gustav GAILS, MD;  Location: [facility] ENDOSCOPY;  Service: Endoscopy;  Laterality: N/A;   ESOPHAGOGASTRODUODENOSCOPY     ESOPHAGOGASTRODUODENOSCOPY N/A 01/22/2017   Procedure: ESOPHAGOGASTRODUODENOSCOPY (EGD);  Surgeon: Sheldon Standing, MD;  Location: [facility] ORS;  Service: General;  Laterality: N/A;   HERNIA REPAIR  2018   INCISIONAL HERNIA REPAIR  09/04/2011   Procedure: HERNIA REPAIR INCISIONAL;  Surgeon: Redell Faith, DO;  Location: [facility] OR;  Service: General;  Laterality: N/A;  debridment calcified mass   INSERTION OF MESH  09/04/2011   retrorectus ultrapro 30x30cm   INSERTION OF MESH N/A 01/22/2017   Procedure: INSERTION OF MESH;  Surgeon: Sheldon Standing, MD;  Location: [facility] ORS;  Service: General;  Laterality: N/A;   JOINT REPLACEMENT  2016   LEFT HEART CATH AND CORONARY ANGIOGRAPHY N/A 04/11/2020   Procedure:  LEFT HEART CATH AND CORONARY ANGIOGRAPHY;  Surgeon: Court Dorn PARAS, MD;  Location: [facility] INVASIVE CV LAB;  Service: Cardiovascular;  Laterality: N/A;   NISSEN FUNDOPLICATION     Open 1990s   POLYPECTOMY     STOMACH SURGERY  10/05/2010   Oversewing of gastric ulcer.  Dr Vanderbilt   TOTAL HIP ARTHROPLASTY Left 04/24/2014   Procedure: LEFT TOTAL HIP ARTHROPLASTY;  Surgeon: Fonda Olmsted, MD;  Location: [facility] OR;  Service: Orthopedics;  Laterality: Left;   UPPER GASTROINTESTINAL ENDOSCOPY     Patient Active Problem List   Diagnosis Date Noted   Carpal tunnel syndrome 08/27/2020   Type 2 diabetes mellitus without complication, without long-term current use of insulin (HCC) 05/02/2020   Class 1 obesity due to excess calories with body mass index (BMI) of 34.0 to 34.9 in adult 05/01/2020    Hypertension associated with diabetes (HCC) 08/17/2019   Frequent headaches 05/15/2019   Hyperlipidemia associated with type 2 diabetes mellitus (HCC) 12/21/2018   History of CVA (cerebrovascular accident) without residual deficits 10/05/2017   Gastroesophageal reflux disease    Insomnia 05/30/2014   Anxiety 05/26/2011    PCP: Antonette Angeline ORN, NP  REFERRING PROVIDER: Skeet Juliene SAUNDERS, DO  REFERRING DIAG: M54.9 (ICD-10-CM) - Back pain, unspecified back location, unspecified back pain laterality, unspecified chronicity  Rationale for Evaluation and Treatment: Rehabilitation  THERAPY DIAG:  Cervicalgia  Pain in thoracic spine  Radiculopathy, cervicothoracic region  ONSET DATE: since 2020  SUBJECTIVE:                                                                                                                                                                                           SUBJECTIVE STATEMENT: [name]  Pt reports his pain has been better this week.  He was able to spend some family time watching his nephew play baseball.  He is now taking protonix  only once a day.    PERTINENT HISTORY:  Anxiety, GERD, CVA, headaches, HTN, DM2, CTS s/p bilateral release 2020, left THA 2016, hernia repair x2 2013 & 2018, ACDF C3-C7 2020  PAIN:  Are you having pain? Yes: NPRS scale: 4-5 Pain location: bilateral hands (esp fingertips) Pain description: tingling Aggravating factors: using hands Relieving factors: not sure  PRECAUTIONS: Fall  RED FLAGS: None   WEIGHT BEARING RESTRICTIONS: No  FALLS:  Has patient fallen in last 6 months? Yes. Number of falls 2-3 - I'll just stumble and fall  LIVING ENVIRONMENT: Lives with: brother Lives in: House/apartment Stairs: No Has following equipment at home: None  OCCUPATION: [employer]  PLOF: Independent  PATIENT GOALS: To see if we  can get to where it's hurting  NEXT MD VISIT: 11/04/2023 Antonette Angeline ORN,  NP  OBJECTIVE:  Note: Objective measures were completed at Evaluation unless otherwise noted.  DIAGNOSTIC FINDINGS:  MRI Cervical 04/20/2022: IMPRESSION: 1. Status post C3-7 fusion. The central canal is open at the operated levels. Mild to moderate foraminal narrowing at C6-7 is more notable on the left. 2. Small focus of chronic myelomalacia in the right hemicord at approximately C5-6.  PATIENT SURVEYS:  Modified Oswestry:  MODIFIED OSWESTRY DISABILITY SCALE  Date: 09/29/2023 Score  Pain intensity 0 = I can tolerate the pain I have without having to use pain medication.  2. Personal care (washing, dressing, etc.) 0 =  I can take care of myself normally without causing increased pain.  3. Lifting 0 = I can lift heavy weights without increased pain.  4. Walking 0 = Pain does not prevent me from walking any distance  5. Sitting 2 =  Pain prevents me from sitting more than 1 hour.  6. Standing 0 =  I can stand as long as I want without increased pain.  7. Sleeping 1 = I can sleep well only by using pain medication.  8. Social Life 0 = My social life is normal and does not increase my pain.  9. Traveling 0 =  I can travel anywhere without increased pain.  10. Employment/ Homemaking 0 = My normal homemaking/job activities do not cause pain.  Total 3/50   Interpretation of scores: Score Category Description  0-20% Minimal Disability The patient can cope with most living activities. Usually no treatment is indicated apart from advice on lifting, sitting and exercise  21-40% Moderate Disability The patient experiences more pain and difficulty with sitting, lifting and standing. Travel and social life are more difficult and they may be disabled from work. Personal care, sexual activity and sleeping are not grossly affected, and the patient can usually be managed by conservative means  41-60% Severe Disability Pain remains the main problem in this group, but activities of daily living are  affected. These patients require a detailed investigation  61-80% Crippled Back pain impinges on all aspects of the patient's life. Positive intervention is required  81-100% Bed-bound  These patients are either bed-bound or exaggerating their symptoms  Bluford FORBES Zoe DELENA Karon DELENA, et al. Surgery versus conservative management of stable thoracolumbar fracture: the PRESTO feasibility RCT. [place]): [employer]; 2021 Nov. [facility] [facility] Technology Assessment, No. 25.62.) Appendix 3, Oswestry Disability Index category descriptors. Available from: FindJewelers.cz  Minimally Clinically Important Difference (MCID) = 12.8% NDI:  NECK DISABILITY INDEX  Date: 09/29/2023 Score  Pain intensity 2 = The pain is moderate at the moment  2. Personal care (washing, dressing, etc.) 0 = I can look after myself normally without causing extra pain  3. Lifting 0 =  I can lift heavy weights without extra pain  4. Reading 0 = I can read as much as I want to with no pain in my neck  5. Headaches 1 =  I have slight headaches, which come infrequently  6. Concentration 0 =  I can concentrate fully when I want to with no difficulty  7. Work 0 =  I can do as much work as I want to  8. Driving 1 =  I can drive my car as long as I want with slight pain in my neck  9. Sleeping 2 = My sleep is mildly disturbed (1-2 hrs sleepless)  10. Recreation 1 =  I am able to engage in all my recreation activities, with some pain in my neck  Total 7/50   Minimum Detectable Change (90% confidence): 5 points or 10% points  COGNITION: Overall cognitive status: Within functional limits for tasks assessed     SENSATION: Light touch: Impaired   POSTURE: rounded shoulders, forward head, and posterior pelvic tilt  PALPATION: Bilateral T4 area - rhomboids and levator scapulae - noted stretched posture of musculature likely due to chronic rounding, possible left oriented rhomboid TP, mild kyphosis  and flattening of cervical lordosis  THORACIC ROM:   AROM eval  Flexion WFL  Extension 25% loss; good muscle activation symmetrically  Right lateral flexion   Left lateral flexion   Right rotation   Left rotation    (Blank rows = not tested)  LOWER EXTREMITY ROM:     Active  Right eval Left eval  Hip flexion WNL WNL  Hip extension    Hip abduction    Hip adduction    Hip internal rotation    Hip external rotation    Knee flexion    Knee extension    Ankle dorsiflexion    Ankle plantarflexion    Ankle inversion    Ankle eversion     (Blank rows = not tested)  LOWER EXTREMITY MMT:    MMT Right eval Left eval  Hip flexion 5 5  Hip extension    Hip abduction 5 5  Hip adduction    Hip internal rotation    Hip external rotation    Knee flexion 5 5  Knee extension 5 5  Ankle dorsiflexion 5 5  Ankle plantarflexion 5 5  Ankle inversion    Ankle eversion     (Blank rows = not tested)  LUMBAR SPECIAL TESTS:  None relevant to chief complaint.  FUNCTIONAL TESTS:  5 times sit to stand: 11.56 sec no UE support  GAIT: Distance walked: various clinic distances Assistive device utilized: None Level of assistance: Complete Independence Comments: No overt instability, mildly dampened arm swing bilaterally.  TREATMENT DATE: 11/10/2023                                                                                                                              -global gentle rib mobilization bilaterally (Grade 2-3) for pain and gentle stretch - much less tension noted today; spike ball STM using stretch and pull technique for myofascial focus to rhomboids/middle trap and upper traps - mild left bowstringing noted; had pt perform several rounds of diaphragmatic breathing for relaxation and posterior rib expansion in prone -Seated twist hold 2x45 sec each side -Ulnar nerve glides x10 each side > ulnar nerve flossing bilaterally x12 -Median nerve tensioner x10 each  side w/ return demo, reports nerve sensation elbow to fingers on LUE > RUE -Radial nerve flossing x20 each side, increased need for return demo and cues to improve coordination of head and hand -Arm bike level 5.0 x4  minutes forward > x4 minutes backward for postural awareness and scapulohumeral rhythm -Hugger stretch into forward fold 2x30 seconds, using legs to brace against mat, more tightness noted w/ RUE  PATIENT EDUCATION:  Education details:  Continue HEP w/ increased consistency - he uses Med[employer] Go app.  Working towards discharge at last scheduled visit. Person educated: Patient Education method: Explanation, Demonstration, Verbal cues, and Handouts Education comprehension: verbalized understanding, returned demonstration, verbal cues required, and needs further education  HOME EXERCISE PROGRAM: Access Code: [facility] URL: [email or web address] Date: 10/08/2023 Prepared by: Daved Bull  Exercises - Standing Median Nerve Glide  - 1 x daily - 5 x weekly - 2 sets - 10 reps - Shoulder External Rotation and Scapular Retraction with Resistance  - 1 x daily - 5 x weekly - 3 sets - 10 reps - Thoracic Foam Roll Mobilization Bench Press  - 1 x daily - 5 x weekly - 2 sets - 10 reps - Sidelying Thoracic Lumbar Rotation  - 1 x daily - 5 x weekly - 1-2 sets - 10 reps - Seated Thoracic Extension at Wall  - 1 x daily - 5 x weekly - 1 sets - 5-6 reps - 5-10 seconds hold - Prone Scapular Retraction Arms at Side  - 1 x daily - 5 x weekly - 2 sets - 10 reps - Prone W Scapular Retraction  - 1 x daily - 5 x weekly - 2 sets - 10 reps - Standing Lat Pull Down with Resistance - Elbows Bent  - 1 x daily - 5 x weekly - 2-3 sets - 10 reps - Face Pulls  - 1 x daily - 5 x weekly - 2-3 sets - 10 reps - Standing Shoulder Row with Anchored Resistance  - 1 x daily - 5 x weekly - 2-3 sets - 10 reps  ASSESSMENT:  CLINICAL IMPRESSION: Further work on passive thoracic mobility with focus on  myofascial laxity today.  Some ongoing trigger points in left > right periscapular area, but pt is overall reporting improved symptoms and activity tolerance.  He has well established HEP that he is intermittently compliant to, mostly when symptomatic so encouraged using this as a preventative measure today.  PT is still planning to discharge at last scheduled appt so long as no major status change occurs.  Reviewed nerve flossing, gliding, and tensioners today w/ most tension remaining in median distribution so encouraged continued work on median nerve glide at home.  Continue per POC.   OBJECTIVE IMPAIRMENTS: decreased activity tolerance, decreased mobility, hypomobility, impaired flexibility, impaired sensation, improper body mechanics, postural dysfunction, and pain.   ACTIVITY LIMITATIONS: carrying, lifting, and reach over head  PARTICIPATION LIMITATIONS: meal prep, cleaning, laundry, and occupation  PERSONAL FACTORS: Past/current experiences, Profession, Time since onset of injury/illness/exacerbation, and 1-2 comorbidities: CTS s/p release and ACDF C3-C7 are also affecting patient's functional outcome.   REHAB POTENTIAL: Good  CLINICAL DECISION MAKING: Evolving/moderate complexity  EVALUATION COMPLEXITY: Moderate   GOALS: Goals reviewed with patient? Yes  SHORT TERM GOALS: Target date: 10/29/2023  Pt will be independent and compliant with strength, stretching, and mobilization focused HEP in order to maintain functional progress and improve mobility. Baseline:  pt is intermittently compliant (10/8) Goal status: PARTIALLY MET  LONG TERM GOALS: Target date: 11/26/2023  Pt will be independent and compliant with advanced and finalized strength, stretching, and mobilization focused HEP in order to maintain functional progress and improve mobility. Baseline: Initiated on eval Goal status: INITIAL  2.  NDI  will improve to </=2/50 in order to demonstrate improved pain management and  quality of life. Baseline: 7/50 Goal status: INITIAL  3.  Pt will report average N/T over recent 7 days as </=2-3 in order to demonstrate improved symptom management. Baseline: 4-5 down BUE Goal status: INITIAL  PLAN:  PT FREQUENCY: 1x/week  PT DURATION: 8 weeks  PLANNED INTERVENTIONS: [id number]- PT Re-evaluation, 97750- Physical Performance Testing, 97110-Therapeutic exercises, 97530- Therapeutic activity, [id number]- Neuromuscular re-education, 97535- Self Care, [id number]- Manual therapy, [phone]- Electrical stimulation (manual), Patient/Family education, Joint mobilization, Spinal mobilization, Scar mobilization, Cryotherapy, and Moist heat.  PLAN FOR NEXT SESSION: Expand HEP. thoracic mobilizations, STM/heat to left and right TP as needed, nerve glides - already provided median nerve in standing, arm bike, quadruped?  Repeat chirp wheel and spike ball mob to left shoulder and lower cervical regions.  Daved KATHEE Bull, PT, DPT 11/10/2023, 10:17 AM

## 2023-11-10 NOTE — Patient Instructions (Addendum)
   WEBSITE: [email or web address]

## 2023-11-10 NOTE — Therapy (Addendum)
 OUTPATIENT OCCUPATIONAL THERAPY NEURO EVALUATION & TREATMENT  Patient Name: [name] MRN: [id number] DOB:[birth year], 53 y.o., male Today's Date: 11/10/2023  PCP: Antonette Angeline LELON, NP REFERRING PROVIDER: Antonette Angeline LELON, NP  END OF SESSION:  OT End of Session - 11/10/23 1112     Visit Number 1    Number of Visits 5    Date for Recertification  12/10/23    Authorization Type [facility] [employer]; Auth not required    Authorization Time Period 09/30/2023  -  09/29/2024    OT Start Time 1015    OT Stop Time 1104    OT Time Calculation (min) 49 min    Equipment Utilized During Treatment Testing materials    Activity Tolerance Patient tolerated treatment well    Behavior During Therapy [facility] for tasks assessed/performed          Past Medical History:  Diagnosis Date   Anxiety 2018   takes Xanax  daily prn, panic attacks   Chronic gastric ulcer with bleeding s/p suture repair 2012 09/29/2010   Head 3 EGD which failed to stop the bleeding. Had exploratory laparotomy on 10/04/2010. Possible cause of the gastric ulcer was NSAID use.    Clotting disorder 2012   Constipation    related to pain meds   Depression    Diabetes mellitus without complication (HCC)    Dizziness    occasionally   Dyspnea    walking a bit   Erosive esophagitis    External hemorrhoid, bleeding    Gastric ulcer    history of   GERD (gastroesophageal reflux disease) 2012   takes Protonix  bid   Hiatal hernia    History of blood clots 2012   in abdomen   History of blood transfusion 2012   Hyperlipidemia    Hypertension 2021   Incisional hernia s/p open repair w mesh Aug 2013 05/26/2011   Joint pain    knees   Nocturia    depends on amount of fluid he drinks   Pneumonia 2018   Pre-diabetes    Primary localized osteoarthritis of left hip 04/24/2014   Serrated polyp of colon    Sleep apnea 2016   no cpap  mild no cpap needed   Stroke (HCC) 2019   Unstable angina (HCC) 04/09/2020   Upper GI bleed     Ventral hernia    Past Surgical History:  Procedure Laterality Date   ANTERIOR CERVICAL DECOMPRESSION/DISCECTOMY FUSION 4 LEVELS N/A 04/08/2018   Procedure: ACDF - C3-C4 - C4-C5 - C5-C6 - C6-C7;  Surgeon: Onetha Kuba, MD;  Location: [facility] OR;  Service: Neurosurgery;  Laterality: N/A;   CARPAL TUNNEL RELEASE Left 04/03/2018   COLONOSCOPY     COLONOSCOPY  07/15/2021   ESOPHAGEAL MANOMETRY N/A 10/19/2016   Procedure: ESOPHAGEAL MANOMETRY (EM);  Surgeon: Shila Gustav GAILS, MD;  Location: [facility] ENDOSCOPY;  Service: Endoscopy;  Laterality: N/A;   ESOPHAGOGASTRODUODENOSCOPY     ESOPHAGOGASTRODUODENOSCOPY N/A 01/22/2017   Procedure: ESOPHAGOGASTRODUODENOSCOPY (EGD);  Surgeon: Sheldon Standing, MD;  Location: [facility] ORS;  Service: General;  Laterality: N/A;   HERNIA REPAIR  2018   INCISIONAL HERNIA REPAIR  09/04/2011   Procedure: HERNIA REPAIR INCISIONAL;  Surgeon: Redell Faith, DO;  Location: [facility] OR;  Service: General;  Laterality: N/A;  debridment calcified mass   INSERTION OF MESH  09/04/2011   retrorectus ultrapro 30x30cm   INSERTION OF MESH N/A 01/22/2017   Procedure: INSERTION OF MESH;  Surgeon: Sheldon Standing, MD;  Location: [facility] ORS;  Service:  General;  Laterality: N/A;   JOINT REPLACEMENT  2016   LEFT HEART CATH AND CORONARY ANGIOGRAPHY N/A 04/11/2020   Procedure: LEFT HEART CATH AND CORONARY ANGIOGRAPHY;  Surgeon: Court Dorn PARAS, MD;  Location: [facility] INVASIVE CV LAB;  Service: Cardiovascular;  Laterality: N/A;   NISSEN FUNDOPLICATION     Open 1990s   POLYPECTOMY     STOMACH SURGERY  10/05/2010   Oversewing of gastric ulcer.  Dr Vanderbilt   TOTAL HIP ARTHROPLASTY Left 04/24/2014   Procedure: LEFT TOTAL HIP ARTHROPLASTY;  Surgeon: Fonda Olmsted, MD;  Location: [facility];  Service: Orthopedics;  Laterality: Left;   UPPER GASTROINTESTINAL ENDOSCOPY     Patient Active Problem List   Diagnosis Date Noted   Carpal tunnel syndrome 08/27/2020   Type 2 diabetes mellitus without complication, without long-term  current use of insulin (HCC) 05/02/2020   Class 1 obesity due to excess calories with body mass index (BMI) of 34.0 to 34.9 in adult 05/01/2020   Hypertension associated with diabetes (HCC) 08/17/2019   Frequent headaches 05/15/2019   Hyperlipidemia associated with type 2 diabetes mellitus (HCC) 12/21/2018   History of CVA (cerebrovascular accident) without residual deficits 10/05/2017   Gastroesophageal reflux disease    Insomnia 05/30/2014   Anxiety 05/26/2011    ONSET DATE: Referral: 09/30/23 Per MR review CTSx 04/03/2018 (L) and 05/29/2019 (R)  REFERRING DIAG: Fine motor impairment  THERAPY DIAG:  Other symptoms and signs involving the nervous system  Other disturbances of skin sensation  Other lack of coordination  Fine motor impairment  Other symptoms and signs involving the musculoskeletal system  Rationale for Evaluation and Treatment: Rehabilitation  SUBJECTIVE:   SUBJECTIVE STATEMENT: Patient underwent carpal tunnel release surgeries a few years ago (between March 2020 and April 2021). Postoperatively, the patient's pain decreased but he has been experiencing ongoing tingling and numbness in BUE more so in the mornings upon waking up. He previously wore splints at night and noted some improvement in symptoms. Currently, the patient denied wearing splints at night or at work. Numbness and tingling persists in both hands, with more pronounced difficulty and functional limitations in the right hand.  Pt accompanied by: self  PERTINENT HISTORY: Anxiety, GERD, CVA, headaches, HTN, DM2, CTS s/p bilateral release 2020 and 2021, left THA 2016, hernia repair x2 2013 & 2018, ACDF C3-C7 2020   PRECAUTIONS: Fall  WEIGHT BEARING RESTRICTIONS: No  PAIN:  Are you having pain? No pain; however, patient reported frequent tingling and numbness in both hands, rated up to 7/10, during which the patient is unable to complete tasks. Symptoms are noticeable when performing tasks that  require a lot of hands-on activity or fine motor skills. At best he rates sensory deficits at a 2/10.  FALLS: Has patient fallen in last 6 months? Yes. Number of falls 2 or 3 pt has tripped a few times while at work.   LIVING ENVIRONMENT: Lives with: brother Lives in: House/apartment Stairs: No Has following equipment at home: None  PLOF: Independent  PATIENT GOALS: Wants to improve functional use of BUE particularly with picking up and manipulating small objects as well as to improve sensation of hands.  OBJECTIVE:  Note: Objective measures were completed at Evaluation unless otherwise noted.  HAND DOMINANCE: Left  ADLs: Overall ADLs: Mod I; Has a hard time picking up small objects. Grooming: Mod I; hard time picking up items  Equipment: none  IADLs: Shopping: Mod I Light housekeeping: Mod I Meal Prep: Has a hard time picking  up pans and other objects.  Community mobility: Independent; drives himself Medication management: Mod I Financial management: Mod I Handwriting: 100% legible; has a hard time writing on surfaces that are not flat and stable.   MOBILITY STATUS: Independent  POSTURE COMMENTS:  No Significant postural limitations Sitting balance: Moves/returns truncal midpoint >2 inches in all planes  ACTIVITY TOLERANCE: Activity tolerance: Pt has not noticed any changes in his activity tolerance.   FUNCTIONAL OUTCOME MEASURES: TBD  UPPER EXTREMITY ROM:   All- WFL  UPPER EXTREMITY MMT:    All- WFL  HAND FUNCTION: Grip strength: Right: 86.4, 89.9, 87.9 lbs; Left: 90.3, 96.7, 107.8 lbs Average Right: 88.1 lbs Left: 98.3 lbs  COORDINATION: 9 Hole Peg test: Right: 33.2 sec; Left: 35.9 sec Box and Blocks:  Right 58blocks, Left 57blocks Had some trouble with picking up pegs with both hands.   SENSATION: Patient reports frequent tingling and numbness in both hands. When assessed, some ulnar nerve involvement/damage was noted. Additionally, the patient  describes a sensation like sandpaper when the affected area (from the wrist up to the fingertips) is touched.  EDEMA: Some noted in both hands  MUSCLE TONE: None noted  COGNITION: Overall cognitive status: Within functional limits for tasks assessed  VISION: Baseline vision: Bifocals and Wears glasses all the time  VISION ASSESSMENT: Not tested  PERCEPTION: WFL  PRAXIS: WFL  OBSERVATIONS: Pt ambulates with no use of AE. No loss of balance. The pt appears well kept and has glasses donned.                                                                                                                      TREATMENT DATE: 11/10/2023 - Therapeutic exercises completed for duration as noted below including:  Pt issued tendon gliding exercises/handout with review of motions to isolate DIP, PIP and MCP joints for straight finger position, hook (DIP/PIP flexion), fist (DIP/PIP/MCP flexion), taco/duck (MCP flexion only) and flat fist (MCP and PIP flexion). Education provided on purpose of tendon glide exercises ie) to increase the circulation to the hand and wrist, reduce swelling and promote healthier soft tissue for increased AROM.  - Self-care/home management completed for duration as noted below including:  The patient was educated on edema management, including keeping the hands elevated to promote fluid drainage from the fingers. He was also given a handout on sleep positioning to help with decreasing numbness and tingling in BUE. Recommendations for sleep positioning included keeping the arms stretched out and using multiple pillows to prop the hands up during sleep (please see pt's instructions for further details). The patient was also encouraged to resume wearing splints at night and as needed at work.  OT also educated on rehabilitation process and results of objective measures in relation to pt specific goals.   PATIENT EDUCATION: Education details: Tendon glides, OT role and POC  consderations Person educated: Patient Education method: Explanation, Demonstration, Tactile cues, Verbal cues, and Handouts Education comprehension: verbalized understanding, returned demonstration, verbal cues required,  tactile cues required, and needs further education  HOME EXERCISE PROGRAM: 11/10/2023: Tendon glides, sleep positioning   GOALS: Goals reviewed with patient? Yes  LONG TERM GOALS: Target date: 12/10/2023  Patient will demonstrate updated B UE HEP with visual handouts only for proper execution.  Baseline: New to outpatient OT Goal status: IN Progress - Tendon Glides provided at eval  2.  Patient will demo improved FM coordination as evidenced by completing nine-hole peg with use of B UE in 30 seconds or less.  Baseline: 9 Hole Peg test: Right: 33.2 sec; Left: 35.9 sec Goal status: INITIAL  3.  Pt will independently recall at least 3 total joint protection, ergonomic, and body mechanic principles.   Baseline: New to outpatient OT  Goal status: INITIAL  4.  Patient will demonstrate proper edema management techniques, including elevating the hands during rest and sleep, and using appropriate positioning strategies. Baseline: New to outpatient OT Goal status: INITIAL  5.  Pt will be independent with splint wear and care for B UE.  Baseline: Pt does not wear splints as much as he use to. Goal status: INITIAL  6.  Pt will independently recall the 5 main sensory precautions (cold, heat, sharp, chemical, and heavy) as needed to prevent injury/harm secondary to impairments.   Baseline: Pt has some sensory deficits; tingling and numbness Goal status: INITIAL  ASSESSMENT:  CLINICAL IMPRESSION: Patient is a 53 y.o. male who was seen today for occupational therapy evaluation for fine motor impairments. Hx includes anxiety, GERD, CVA, headaches, HTN, DM2, CTS s/p bilateral release 2020 and 2021, left THA 2016, hernia repair x2 2013 & 2018, and ACDF C3-C7 2020.  Patient  currently presents near baseline level of function with ADLS but presents with functional deficits and impairments with coordination as evidenced by self reports of dropping small objects and slight delay in coordination testing. Pt would benefit from skilled OT services in the outpatient setting to work on impairments as noted below to help pt return to PLOF as able.     PERFORMANCE DEFICITS: in functional skills including ADLs, IADLs, coordination, dexterity, sensation, edema, ROM, Fine motor control, Gross motor control, body mechanics, decreased knowledge of precautions, decreased knowledge of use of DME, and UE functional use. IMPAIRMENTS: are limiting patient from ADLs, IADLs, work, and leisure.   CO-MORBIDITIES: may have co-morbidities  that affects occupational performance. Patient will benefit from skilled OT to address above impairments and improve overall function.  MODIFICATION OR ASSISTANCE TO COMPLETE EVALUATION: No modification of tasks or assist necessary to complete an evaluation.  OT OCCUPATIONAL PROFILE AND HISTORY: Problem focused assessment: Including review of records relating to presenting problem.  CLINICAL DECISION MAKING: LOW - limited treatment options, no task modification necessary  REHAB POTENTIAL: Good  EVALUATION COMPLEXITY: Low    PLAN:  OT FREQUENCY: 1x/week  OT DURATION: 4 weeks + eval/tx  PLANNED INTERVENTIONS: 97168 OT Re-evaluation, 97535 self care/ADL training, 02889 therapeutic exercise, 97530 therapeutic activity, 97112 neuromuscular re-education, 97140 manual therapy, 97035 ultrasound, 97018 paraffin, 02960 fluidotherapy, 97010 moist heat, 97010 cryotherapy, 97034 contrast bath, 97032 electrical stimulation (manual), 97750 Physical Performance Testing, 02239 Orthotic Initial, [id number] Orthotic/Prosthetic subsequent, manual lymph drainage, passive range of motion, functional mobility training, energy conservation, coping strategies training,  patient/family education, and DME and/or AE instructions  RECOMMENDED OTHER SERVICES: Pt is currently seeing PT as well. No further recommended services at the time of eval. (Maybe potential follow-up with neurologist)   CONSULTED AND AGREED WITH PLAN OF CARE: Patient  PLAN FOR NEXT SESSIONS:  1) Check splints (pt to bring his from home); median/ulnar nerve glides; review tendon glides/sleep positions 2) Coordination handout; Joint protection (AE considerations)/ Sensory precautions 3) ROM/putty exercises 4) DC - assess goals   Clarita LITTIE Pride, OT 11/10/2023, 12:25 PM

## 2023-11-11 ENCOUNTER — Other Ambulatory Visit: Payer: Self-pay

## 2023-11-11 MED ORDER — ONDANSETRON HCL 4 MG PO TABS
4.0000 mg | ORAL_TABLET | Freq: Three times a day (TID) | ORAL | 1 refills | Status: AC | PRN
  Filled 2023-11-11: qty 20, 7d supply, fill #0
  Filled 2024-02-01: qty 20, 7d supply, fill #1

## 2023-11-11 MED ORDER — LISINOPRIL 10 MG PO TABS
10.0000 mg | ORAL_TABLET | Freq: Every day | ORAL | 0 refills | Status: DC
  Filled 2023-11-11: qty 90, 90d supply, fill #0

## 2023-11-11 MED ORDER — ZOLPIDEM TARTRATE ER 12.5 MG PO TBCR
12.5000 mg | EXTENDED_RELEASE_TABLET | Freq: Every day | ORAL | 0 refills | Status: DC
Start: 2023-11-11 — End: 2023-12-10
  Filled 2023-11-11: qty 30, 30d supply, fill #0

## 2023-11-11 MED ORDER — CLONAZEPAM 0.5 MG PO TABS
0.5000 mg | ORAL_TABLET | Freq: Every day | ORAL | 0 refills | Status: DC
  Filled 2023-11-11: qty 30, 30d supply, fill #0

## 2023-11-11 NOTE — Telephone Encounter (Signed)
 Requested medication (s) are due for refill today: yes  Requested medication (s) are on the active medication list: yes  Last refill:  multiple dates  Future visit scheduled: yes  Notes to clinic:  Unable to refill per protocol, cannot delegate.      Requested Prescriptions  Pending Prescriptions Disp Refills   ondansetron  (ZOFRAN ) 4 MG tablet 20 tablet 1    Sig: Take 1 tablet (4 mg total) by mouth every 8 (eight) hours as needed for nausea or vomiting.     Not Delegated - Gastroenterology: Antiemetics - ondansetron  Failed - 11/11/2023  8:11 AM      Failed - This refill cannot be delegated      Passed - AST in normal range and within 360 days    AST  Date Value Ref Range Status  11/04/2023 23 10 - 35 U/L Final         Passed - ALT in normal range and within 360 days    ALT  Date Value Ref Range Status  11/04/2023 31 9 - 46 U/L Final         Passed - Valid encounter within last 6 months    Recent Outpatient Visits           1 week ago Type 2 diabetes mellitus without complication, without long-term current use of insulin [facility])   [place] [facility] [place], [place], NP   6 months ago Encounter for general adult medical examination with abnormal findings   [place] [facility] [place], Angeline ORN, NP               zolpidem  (AMBIEN  CR) 12.5 MG CR tablet 30 tablet 0    Sig: Take 1 tablet (12.5 mg total) by mouth at bedtime.     Not Delegated - Psychiatry:  Anxiolytics/Hypnotics Failed - 11/11/2023  8:11 AM      Failed - This refill cannot be delegated      Failed - Urine Drug Screen completed in last 360 days      Passed - Valid encounter within last 6 months    Recent Outpatient Visits           1 week ago Type 2 diabetes mellitus without complication, without long-term current use of insulin [facility])   [place] [facility] [place], Angeline ORN, NP   6 months ago Encounter for general adult medical examination  with abnormal findings   [place] [facility] [place], Angeline ORN, NP               clonazePAM  (KLONOPIN ) 0.5 MG tablet 30 tablet 0    Sig: Take 1 tablet (0.5 mg total) by mouth at bedtime.     Not Delegated - Psychiatry: Anxiolytics/Hypnotics 2 Failed - 11/11/2023  8:11 AM      Failed - This refill cannot be delegated      Failed - Urine Drug Screen completed in last 360 days      Passed - Patient is not pregnant      Passed - Valid encounter within last 6 months    Recent Outpatient Visits           1 week ago Type 2 diabetes mellitus without complication, without long-term current use of insulin [facility])   [place] [facility] [place], Angeline ORN, NP   6 months ago Encounter for general adult medical examination with abnormal findings   [facility] [place], Angeline ORN, NP              Signed Prescriptions Disp Refills   lisinopril  (ZESTRIL ) 10 MG tablet 90 tablet 0    Sig: Take 1 tablet (10 mg total) by mouth daily.     Cardiovascular:  ACE Inhibitors Passed - 11/11/2023  8:11 AM      Passed - Cr in normal range and within 180 days    Creat  Date Value Ref Range Status  11/04/2023 0.87 0.70 - 1.30 mg/dL Final   Creatinine, Urine  Date Value Ref Range Status  11/04/2023 89 20 - 320 mg/dL Final         Passed - K in normal range and within 180 days    Potassium  Date Value Ref Range Status  11/04/2023 5.3 3.5 - 5.3 mmol/L Final         Passed - Patient is not pregnant      Passed - Last BP in normal range    BP Readings from Last 1 Encounters:  11/04/23 134/84         Passed - Valid encounter within last 6 months    Recent Outpatient Visits           1 week ago Type 2 diabetes mellitus without complication, without long-term current use of insulin [facility])   [place] [facility] [place], Angeline ORN, NP   6 months ago Encounter for general adult medical examination with abnormal findings    [place] [facility] [place], Angeline ORN, NP

## 2023-11-11 NOTE — Addendum Note (Signed)
 Addended by: Novali Vollman L on: 11/11/2023 04:06 PM   Modules accepted: Orders

## 2023-11-11 NOTE — Telephone Encounter (Signed)
 Requested Prescriptions  Pending Prescriptions Disp Refills   lisinopril  (ZESTRIL ) 10 MG tablet 90 tablet 0    Sig: Take 1 tablet (10 mg total) by mouth daily.     Cardiovascular:  ACE Inhibitors Passed - 11/11/2023  8:10 AM      Passed - Cr in normal range and within 180 days    Creat  Date Value Ref Range Status  11/04/2023 0.87 0.70 - 1.30 mg/dL Final   Creatinine, Urine  Date Value Ref Range Status  11/04/2023 89 20 - 320 mg/dL Final         Passed - K in normal range and within 180 days    Potassium  Date Value Ref Range Status  11/04/2023 5.3 3.5 - 5.3 mmol/L Final         Passed - Patient is not pregnant      Passed - Last BP in normal range    BP Readings from Last 1 Encounters:  11/04/23 134/84         Passed - Valid encounter within last 6 months    Recent Outpatient Visits           1 week ago Type 2 diabetes mellitus without complication, without long-term current use of insulin [facility])   [place] [facility] [place], [place] W, NP   6 months ago Encounter for general adult medical examination with abnormal findings   [place] [employer] [place], Angeline ORN, NP               ondansetron  (ZOFRAN ) 4 MG tablet 20 tablet 1    Sig: Take 1 tablet (4 mg total) by mouth every 8 (eight) hours as needed for nausea or vomiting.     Not Delegated - Gastroenterology: Antiemetics - ondansetron  Failed - 11/11/2023  8:10 AM      Failed - This refill cannot be delegated      Passed - AST in normal range and within 360 days    AST  Date Value Ref Range Status  11/04/2023 23 10 - 35 U/L Final         Passed - ALT in normal range and within 360 days    ALT  Date Value Ref Range Status  11/04/2023 31 9 - 46 U/L Final         Passed - Valid encounter within last 6 months    Recent Outpatient Visits           1 week ago Type 2 diabetes mellitus without complication, without long-term current use of insulin [facility])   [place] [facility] [place], [place], NP   6 months ago Encounter for general adult medical examination with abnormal findings   [place] [facility] [place], Angeline ORN, NP               zolpidem  (AMBIEN  CR) 12.5 MG CR tablet 30 tablet 0    Sig: Take 1 tablet (12.5 mg total) by mouth at bedtime.     Not Delegated - Psychiatry:  Anxiolytics/Hypnotics Failed - 11/11/2023  8:10 AM      Failed - This refill cannot be delegated      Failed - Urine Drug Screen completed in last 360 days      Passed - Valid encounter within last 6 months    Recent Outpatient Visits           1 week ago Type 2 diabetes  mellitus without complication, without long-term current use of insulin [facility])   [place] [facility] [place], Angeline ORN, NP   6 months ago Encounter for general adult medical examination with abnormal findings   [place] [facility] [place], Angeline ORN, NP               clonazePAM  (KLONOPIN ) 0.5 MG tablet 30 tablet 0    Sig: Take 1 tablet (0.5 mg total) by mouth at bedtime.     Not Delegated - Psychiatry: Anxiolytics/Hypnotics 2 Failed - 11/11/2023  8:10 AM      Failed - This refill cannot be delegated      Failed - Urine Drug Screen completed in last 360 days      Passed - Patient is not pregnant      Passed - Valid encounter within last 6 months    Recent Outpatient Visits           1 week ago Type 2 diabetes mellitus without complication, without long-term current use of insulin [facility])   [place] [facility] [place], Angeline ORN, NP   6 months ago Encounter for general adult medical examination with abnormal findings   [place] [facility] [place], Angeline ORN, NP

## 2023-11-17 ENCOUNTER — Encounter: Payer: Self-pay | Admitting: Physical Therapy

## 2023-11-17 ENCOUNTER — Ambulatory Visit: Admitting: Occupational Therapy

## 2023-11-17 ENCOUNTER — Ambulatory Visit: Admitting: Physical Therapy

## 2023-11-17 ENCOUNTER — Encounter: Payer: Self-pay | Admitting: Occupational Therapy

## 2023-11-17 DIAGNOSIS — M546 Pain in thoracic spine: Secondary | ICD-10-CM | POA: Diagnosis not present

## 2023-11-17 DIAGNOSIS — R29818 Other symptoms and signs involving the nervous system: Secondary | ICD-10-CM

## 2023-11-17 DIAGNOSIS — M5413 Radiculopathy, cervicothoracic region: Secondary | ICD-10-CM

## 2023-11-17 DIAGNOSIS — R278 Other lack of coordination: Secondary | ICD-10-CM

## 2023-11-17 DIAGNOSIS — R29898 Other symptoms and signs involving the musculoskeletal system: Secondary | ICD-10-CM

## 2023-11-17 DIAGNOSIS — R208 Other disturbances of skin sensation: Secondary | ICD-10-CM

## 2023-11-17 DIAGNOSIS — M542 Cervicalgia: Secondary | ICD-10-CM | POA: Diagnosis not present

## 2023-11-17 NOTE — Therapy (Signed)
 OUTPATIENT PHYSICAL THERAPY THORACOLUMBAR TREATMENT   Patient Name: [name] MRN: [id number] DOB:12-May-1970, 53 y.o., male Today's Date: 11/17/2023  END OF SESSION:  PT End of Session - 11/17/23 0942     Visit Number 7    Number of Visits 9   8 + eval   Date for Recertification  12/10/23   pushed out due to delay in scheduling   Authorization Type Jolynn Davene Leyden    PT Start Time 9062    PT Stop Time 1016    PT Time Calculation (min) 39 min    Activity Tolerance Patient tolerated treatment well    Behavior During Therapy [name] for tasks assessed/performed          Past Medical History:  Diagnosis Date   Anxiety 2018   takes Xanax  daily prn, panic attacks   Chronic gastric ulcer with bleeding s/p suture repair 2012 09/29/2010   Head 3 EGD which failed to stop the bleeding. Had exploratory laparotomy on 10/04/2010. Possible cause of the gastric ulcer was NSAID use.    Clotting disorder 2012   Constipation    related to pain meds   Depression    Diabetes mellitus without complication (HCC)    Dizziness    occasionally   Dyspnea    walking a bit   Erosive esophagitis    External hemorrhoid, bleeding    Gastric ulcer    history of   GERD (gastroesophageal reflux disease) 2012   takes Protonix  bid   Hiatal hernia    History of blood clots 2012   in abdomen   History of blood transfusion 2012   Hyperlipidemia    Hypertension 2021   Incisional hernia s/p open repair w mesh Aug 2013 05/26/2011   Joint pain    knees   Nocturia    depends on amount of fluid he drinks   Pneumonia 2018   Pre-diabetes    Primary localized osteoarthritis of left hip 04/24/2014   Serrated polyp of colon    Sleep apnea 2016   no cpap  mild no cpap needed   Stroke (HCC) 2019   Unstable angina (HCC) 04/09/2020   Upper GI bleed    Ventral hernia    Past Surgical History:  Procedure Laterality Date   ANTERIOR CERVICAL DECOMPRESSION/DISCECTOMY FUSION 4 LEVELS N/A 04/08/2018    Procedure: ACDF - C3-C4 - C4-C5 - C5-C6 - C6-C7;  Surgeon: Onetha Kuba, MD;  Location: [facility] OR;  Service: Neurosurgery;  Laterality: N/A;   CARPAL TUNNEL RELEASE Left 04/03/2018   COLONOSCOPY     COLONOSCOPY  07/15/2021   ESOPHAGEAL MANOMETRY N/A 10/19/2016   Procedure: ESOPHAGEAL MANOMETRY (EM);  Surgeon: Shila Gustav GAILS, MD;  Location: [facility] ENDOSCOPY;  Service: Endoscopy;  Laterality: N/A;   ESOPHAGOGASTRODUODENOSCOPY     ESOPHAGOGASTRODUODENOSCOPY N/A 01/22/2017   Procedure: ESOPHAGOGASTRODUODENOSCOPY (EGD);  Surgeon: Sheldon Standing, MD;  Location: [facility] ORS;  Service: General;  Laterality: N/A;   HERNIA REPAIR  2018   INCISIONAL HERNIA REPAIR  09/04/2011   Procedure: HERNIA REPAIR INCISIONAL;  Surgeon: Redell Faith, DO;  Location: [facility] OR;  Service: General;  Laterality: N/A;  debridment calcified mass   INSERTION OF MESH  09/04/2011   retrorectus ultrapro 30x30cm   INSERTION OF MESH N/A 01/22/2017   Procedure: INSERTION OF MESH;  Surgeon: Sheldon Standing, MD;  Location: [facility] ORS;  Service: General;  Laterality: N/A;   JOINT REPLACEMENT  2016   LEFT HEART CATH AND CORONARY ANGIOGRAPHY N/A 04/11/2020   Procedure:  LEFT HEART CATH AND CORONARY ANGIOGRAPHY;  Surgeon: Court Dorn PARAS, MD;  Location: [facility] INVASIVE CV LAB;  Service: Cardiovascular;  Laterality: N/A;   NISSEN FUNDOPLICATION     Open 1990s   POLYPECTOMY     STOMACH SURGERY  10/05/2010   Oversewing of gastric ulcer.  Dr Vanderbilt   TOTAL HIP ARTHROPLASTY Left 04/24/2014   Procedure: LEFT TOTAL HIP ARTHROPLASTY;  Surgeon: Fonda Olmsted, MD;  Location: [facility] OR;  Service: Orthopedics;  Laterality: Left;   UPPER GASTROINTESTINAL ENDOSCOPY     Patient Active Problem List   Diagnosis Date Noted   Carpal tunnel syndrome 08/27/2020   Type 2 diabetes mellitus without complication, without long-term current use of insulin (HCC) 05/02/2020   Class 1 obesity due to excess calories with body mass index (BMI) of 34.0 to 34.9 in adult 05/01/2020    Hypertension associated with diabetes (HCC) 08/17/2019   Frequent headaches 05/15/2019   Hyperlipidemia associated with type 2 diabetes mellitus (HCC) 12/21/2018   History of CVA (cerebrovascular accident) without residual deficits 10/05/2017   Gastroesophageal reflux disease    Insomnia 05/30/2014   Anxiety 05/26/2011    PCP: Antonette Angeline ORN, NP  REFERRING PROVIDER: Skeet Juliene SAUNDERS, DO  REFERRING DIAG: M54.9 (ICD-10-CM) - Back pain, unspecified back location, unspecified back pain laterality, unspecified chronicity  Rationale for Evaluation and Treatment: Rehabilitation  THERAPY DIAG:  Other symptoms and signs involving the nervous system  Other lack of coordination  Other symptoms and signs involving the musculoskeletal system  Cervicalgia  Pain in thoracic spine  Radiculopathy, cervicothoracic region  ONSET DATE: since 2020  SUBJECTIVE:                                                                                                                                                                                           SUBJECTIVE STATEMENT: [name]  Pt reports his pain was worse last night but he is unsure what provoked it.  He reports ongoing pain today though it is better.  PERTINENT HISTORY:  Anxiety, GERD, CVA, headaches, HTN, DM2, CTS s/p bilateral release 2020, left THA 2016, hernia repair x2 2013 & 2018, ACDF C3-C7 2020  PAIN:  Are you having pain? Yes: NPRS scale: 4-5 Pain location: bilateral hands (esp fingertips) and upper back Pain description: tingling Aggravating factors: using hands Relieving factors: not sure  PRECAUTIONS: Fall  RED FLAGS: None   WEIGHT BEARING RESTRICTIONS: No  FALLS:  Has patient fallen in last 6 months? Yes. Number of falls 2-3 - I'll just stumble and fall  LIVING ENVIRONMENT: Lives with: brother Lives in: House/apartment Stairs: No Has following equipment at home:  None  OCCUPATION: [employer]  PLOF: Independent  PATIENT GOALS: To see if we can get to where it's hurting  NEXT MD VISIT: 11/04/2023 Antonette Angeline ORN, NP  OBJECTIVE:  Note: Objective measures were completed at Evaluation unless otherwise noted.  DIAGNOSTIC FINDINGS:  MRI Cervical 04/20/2022: IMPRESSION: 1. Status post C3-7 fusion. The central canal is open at the operated levels. Mild to moderate foraminal narrowing at C6-7 is more notable on the left. 2. Small focus of chronic myelomalacia in the right hemicord at approximately C5-6.  PATIENT SURVEYS:  Modified Oswestry:  MODIFIED OSWESTRY DISABILITY SCALE  Date: 09/29/2023 Score  Pain intensity 0 = I can tolerate the pain I have without having to use pain medication.  2. Personal care (washing, dressing, etc.) 0 =  I can take care of myself normally without causing increased pain.  3. Lifting 0 = I can lift heavy weights without increased pain.  4. Walking 0 = Pain does not prevent me from walking any distance  5. Sitting 2 =  Pain prevents me from sitting more than 1 hour.  6. Standing 0 =  I can stand as long as I want without increased pain.  7. Sleeping 1 = I can sleep well only by using pain medication.  8. Social Life 0 = My social life is normal and does not increase my pain.  9. Traveling 0 =  I can travel anywhere without increased pain.  10. Employment/ Homemaking 0 = My normal homemaking/job activities do not cause pain.  Total 3/50   Interpretation of scores: Score Category Description  0-20% Minimal Disability The patient can cope with most living activities. Usually no treatment is indicated apart from advice on lifting, sitting and exercise  21-40% Moderate Disability The patient experiences more pain and difficulty with sitting, lifting and standing. Travel and social life are more difficult and they may be disabled from work. Personal care, sexual activity and sleeping are not grossly affected, and the patient can usually be  managed by conservative means  41-60% Severe Disability Pain remains the main problem in this group, but activities of daily living are affected. These patients require a detailed investigation  61-80% Crippled Back pain impinges on all aspects of the patient's life. Positive intervention is required  81-100% Bed-bound  These patients are either bed-bound or exaggerating their symptoms  Bluford FORBES Zoe DELENA Karon DELENA, et al. Surgery versus conservative management of stable thoracolumbar fracture: the PRESTO feasibility RCT. [place]): [employer]; 2021 Nov. [facility] Technology Assessment, No. 25.62.) Appendix 3, Oswestry Disability Index category descriptors. Available from: FindJewelers.cz  Minimally Clinically Important Difference (MCID) = 12.8% NDI:  NECK DISABILITY INDEX  Date: 09/29/2023 Score  Pain intensity 2 = The pain is moderate at the moment  2. Personal care (washing, dressing, etc.) 0 = I can look after myself normally without causing extra pain  3. Lifting 0 =  I can lift heavy weights without extra pain  4. Reading 0 = I can read as much as I want to with no pain in my neck  5. Headaches 1 =  I have slight headaches, which come infrequently  6. Concentration 0 =  I can concentrate fully when I want to with no difficulty  7. Work 0 =  I can do as much work as I want to  8. Driving 1 =  I can drive my car as long as I want with slight pain in my neck  9. Sleeping 2 = My sleep is mildly disturbed (1-2 hrs sleepless)  10. Recreation 1 =  I am able to engage in all my recreation activities, with some pain in my neck  Total 7/50   Minimum Detectable Change (90% confidence): 5 points or 10% points  COGNITION: Overall cognitive status: Within functional limits for tasks assessed     SENSATION: Light touch: Impaired   POSTURE: rounded shoulders, forward head, and posterior pelvic tilt  PALPATION: Bilateral T4 area - rhomboids and  levator scapulae - noted stretched posture of musculature likely due to chronic rounding, possible left oriented rhomboid TP, mild kyphosis and flattening of cervical lordosis  THORACIC ROM:   AROM eval  Flexion WFL  Extension 25% loss; good muscle activation symmetrically  Right lateral flexion   Left lateral flexion   Right rotation   Left rotation    (Blank rows = not tested)  LOWER EXTREMITY ROM:     Active  Right eval Left eval  Hip flexion WNL WNL  Hip extension    Hip abduction    Hip adduction    Hip internal rotation    Hip external rotation    Knee flexion    Knee extension    Ankle dorsiflexion    Ankle plantarflexion    Ankle inversion    Ankle eversion     (Blank rows = not tested)  LOWER EXTREMITY MMT:    MMT Right eval Left eval  Hip flexion 5 5  Hip extension    Hip abduction 5 5  Hip adduction    Hip internal rotation    Hip external rotation    Knee flexion 5 5  Knee extension 5 5  Ankle dorsiflexion 5 5  Ankle plantarflexion 5 5  Ankle inversion    Ankle eversion     (Blank rows = not tested)  LUMBAR SPECIAL TESTS:  None relevant to chief complaint.  FUNCTIONAL TESTS:  5 times sit to stand: 11.56 sec no UE support  GAIT: Distance walked: various clinic distances Assistive device utilized: None Level of assistance: Complete Independence Comments: No overt instability, mildly dampened arm swing bilaterally.  TREATMENT DATE: 11/17/2023                                                                                                                              -Supine chirp wheel - directed pt for gentle ROM, pressure vs myofascial stretching using different prominences, and overall relaxation over device for lower cervical mobilization -Supine half bolster upper thoracic rocking into static hold for chest opener stretch vs STM -Supine bilateral 5lb shoulder protraction 2x12 -Supine 10lb chest press (lower focus) x15 >  (upper focus) x15 -Quadruped cat cow x20, cues for head positioning, pt has limited thoracic extension -Quadruped UE alt reach x20 -Quadruped thread the needle x30 second hold each side -Y to handcuffs x15, mild discomfort w/ full IR bilaterally -Seated thoracic mobilization stretch at wall -  cued to improve elbow position for depth of stretch  PATIENT EDUCATION:  Education details:  Continue HEP w/ increased consistency - he uses MedBridge Go app.  Working towards discharge at last scheduled visit - will assess goals and determine most appropriate POC next visit.  Discussed trying stretches when symptoms occur for improved management. Person educated: Patient Education method: Explanation, Demonstration, Verbal cues, and Handouts Education comprehension: verbalized understanding, returned demonstration, verbal cues required, and needs further education  HOME EXERCISE PROGRAM: Access Code: [facility] URL: [email or web address] Date: 10/08/2023 Prepared by: Daved Bull  Exercises - Standing Median Nerve Glide  - 1 x daily - 5 x weekly - 2 sets - 10 reps - Shoulder External Rotation and Scapular Retraction with Resistance  - 1 x daily - 5 x weekly - 3 sets - 10 reps - Thoracic Foam Roll Mobilization Bench Press  - 1 x daily - 5 x weekly - 2 sets - 10 reps - Sidelying Thoracic Lumbar Rotation  - 1 x daily - 5 x weekly - 1-2 sets - 10 reps - Seated Thoracic Extension at Wall  - 1 x daily - 5 x weekly - 1 sets - 5-6 reps - 5-10 seconds hold - Prone Scapular Retraction Arms at Side  - 1 x daily - 5 x weekly - 2 sets - 10 reps - Prone W Scapular Retraction  - 1 x daily - 5 x weekly - 2 sets - 10 reps - Standing Lat Pull Down with Resistance - Elbows Bent  - 1 x daily - 5 x weekly - 2-3 sets - 10 reps - Face Pulls  - 1 x daily - 5 x weekly - 2-3 sets - 10 reps - Standing Shoulder Row with Anchored Resistance  - 1 x daily - 5 x weekly - 2-3 sets - 10 reps - Quadruped Full Range  Thoracic Rotation with Reach  - 1 x daily - 5 x weekly - 1 sets - 5 reps - 2-3 seconds hold - Cat Cow  - 1 x daily - 5 x weekly - 1-2 sets - 10 reps  ASSESSMENT:  CLINICAL IMPRESSION: Pt presents today with recent thoracic pain flare and ongoing fingertip tingling.  He has not consistently tried interventions with symptom onset but was encouraged to do so.  He has some relief with stretching and mobilizations completed today.  Pt to assess LTGs next session to determine need for further skilled services vs pivot to home management of ongoing symptoms.  Continue per POC.   OBJECTIVE IMPAIRMENTS: decreased activity tolerance, decreased mobility, hypomobility, impaired flexibility, impaired sensation, improper body mechanics, postural dysfunction, and pain.   ACTIVITY LIMITATIONS: carrying, lifting, and reach over head  PARTICIPATION LIMITATIONS: meal prep, cleaning, laundry, and occupation  PERSONAL FACTORS: Past/current experiences, Profession, Time since onset of injury/illness/exacerbation, and 1-2 comorbidities: CTS s/p release and ACDF C3-C7 are also affecting patient's functional outcome.   REHAB POTENTIAL: Good  CLINICAL DECISION MAKING: Evolving/moderate complexity  EVALUATION COMPLEXITY: Moderate   GOALS: Goals reviewed with patient? Yes  SHORT TERM GOALS: Target date: 10/29/2023  Pt will be independent and compliant with strength, stretching, and mobilization focused HEP in order to maintain functional progress and improve mobility. Baseline:  pt is intermittently compliant (10/8) Goal status: PARTIALLY MET  LONG TERM GOALS: Target date: 11/26/2023  Pt will be independent and compliant with advanced and finalized strength, stretching, and mobilization focused HEP in order to maintain functional progress and improve mobility. Baseline: Initiated on eval Goal status:  INITIAL  2.  NDI will improve to </=2/50 in order to demonstrate improved pain management and quality of  life. Baseline: 7/50 Goal status: INITIAL  3.  Pt will report average N/T over recent 7 days as </=2-3 in order to demonstrate improved symptom management. Baseline: 4-5 down BUE Goal status: INITIAL  PLAN:  PT FREQUENCY: 1x/week  PT DURATION: 8 weeks  PLANNED INTERVENTIONS: 97164- PT Re-evaluation, 97750- Physical Performance Testing, 97110-Therapeutic exercises, 97530- Therapeutic activity, [id number]- Neuromuscular re-education, 97535- Self Care, [id number]- Manual therapy, [phone]- Electrical stimulation (manual), Patient/Family education, Joint mobilization, Spinal mobilization, Scar mobilization, Cryotherapy, and Moist heat.  PLAN FOR NEXT SESSION: ASSESS LTGs - D/C?    STM/heat to left and right TP as needed, arm bike  Daved KATHEE Bull, PT, DPT 11/17/2023, 10:57 AM

## 2023-11-17 NOTE — Patient Instructions (Signed)
-   Quadruped Full Range Thoracic Rotation with Reach  - 1 x daily - 5 x weekly - 1 sets - 5 reps - 2-3 seconds hold - Cat Cow  - 1 x daily - 5 x weekly - 1-2 sets - 10 reps

## 2023-11-17 NOTE — Therapy (Signed)
 OUTPATIENT OCCUPATIONAL THERAPY NEURO  TREATMENT  Patient Name: [name] MRN: [id number] DOB:1970/04/24, 53 y.o., male Today's Date: 11/17/2023  PCP: Antonette Angeline LELON, NP REFERRING PROVIDER: Antonette Angeline LELON, NP  END OF SESSION:  OT End of Session - 11/17/23 1022     Visit Number 2    Number of Visits 5    Date for Recertification  12/10/23    Authorization Type [facility] [employer]; Auth not required    Authorization Time Period 09/30/2023  -  09/29/2024    OT Start Time 1018    OT Stop Time 1100    OT Time Calculation (min) 42 min    Equipment Utilized During Treatment --    Activity Tolerance Patient tolerated treatment well    Behavior During Therapy [facility] for tasks assessed/performed          Past Medical History:  Diagnosis Date   Anxiety 2018   takes Xanax  daily prn, panic attacks   Chronic gastric ulcer with bleeding s/p suture repair 2012 09/29/2010   Head 3 EGD which failed to stop the bleeding. Had exploratory laparotomy on 10/04/2010. Possible cause of the gastric ulcer was NSAID use.    Clotting disorder 2012   Constipation    related to pain meds   Depression    Diabetes mellitus without complication (HCC)    Dizziness    occasionally   Dyspnea    walking a bit   Erosive esophagitis    External hemorrhoid, bleeding    Gastric ulcer    history of   GERD (gastroesophageal reflux disease) 2012   takes Protonix  bid   Hiatal hernia    History of blood clots 2012   in abdomen   History of blood transfusion 2012   Hyperlipidemia    Hypertension 2021   Incisional hernia s/p open repair w mesh Aug 2013 05/26/2011   Joint pain    knees   Nocturia    depends on amount of fluid he drinks   Pneumonia 2018   Pre-diabetes    Primary localized osteoarthritis of left hip 04/24/2014   Serrated polyp of colon    Sleep apnea 2016   no cpap  mild no cpap needed   Stroke (HCC) 2019   Unstable angina (HCC) 04/09/2020   Upper GI bleed    Ventral hernia    Past  Surgical History:  Procedure Laterality Date   ANTERIOR CERVICAL DECOMPRESSION/DISCECTOMY FUSION 4 LEVELS N/A 04/08/2018   Procedure: ACDF - C3-C4 - C4-C5 - C5-C6 - C6-C7;  Surgeon: Onetha Kuba, MD;  Location: [facility] OR;  Service: Neurosurgery;  Laterality: N/A;   CARPAL TUNNEL RELEASE Left 04/03/2018   COLONOSCOPY     COLONOSCOPY  07/15/2021   ESOPHAGEAL MANOMETRY N/A 10/19/2016   Procedure: ESOPHAGEAL MANOMETRY (EM);  Surgeon: Shila Gustav GAILS, MD;  Location: [facility] ENDOSCOPY;  Service: Endoscopy;  Laterality: N/A;   ESOPHAGOGASTRODUODENOSCOPY     ESOPHAGOGASTRODUODENOSCOPY N/A 01/22/2017   Procedure: ESOPHAGOGASTRODUODENOSCOPY (EGD);  Surgeon: Sheldon Standing, MD;  Location: [facility] ORS;  Service: General;  Laterality: N/A;   HERNIA REPAIR  2018   INCISIONAL HERNIA REPAIR  09/04/2011   Procedure: HERNIA REPAIR INCISIONAL;  Surgeon: Redell Faith, DO;  Location: [facility] OR;  Service: General;  Laterality: N/A;  debridment calcified mass   INSERTION OF MESH  09/04/2011   retrorectus ultrapro 30x30cm   INSERTION OF MESH N/A 01/22/2017   Procedure: INSERTION OF MESH;  Surgeon: Sheldon Standing, MD;  Location: [facility] ORS;  Service: General;  Laterality: N/A;   JOINT REPLACEMENT  2016   LEFT HEART CATH AND CORONARY ANGIOGRAPHY N/A 04/11/2020   Procedure: LEFT HEART CATH AND CORONARY ANGIOGRAPHY;  Surgeon: Court Dorn PARAS, MD;  Location: [facility] INVASIVE CV LAB;  Service: Cardiovascular;  Laterality: N/A;   NISSEN FUNDOPLICATION     Open 1990s   POLYPECTOMY     STOMACH SURGERY  10/05/2010   Oversewing of gastric ulcer.  Dr Vanderbilt   TOTAL HIP ARTHROPLASTY Left 04/24/2014   Procedure: LEFT TOTAL HIP ARTHROPLASTY;  Surgeon: Fonda Olmsted, MD;  Location: [facility] OR;  Service: Orthopedics;  Laterality: Left;   UPPER GASTROINTESTINAL ENDOSCOPY     Patient Active Problem List   Diagnosis Date Noted   Carpal tunnel syndrome 08/27/2020   Type 2 diabetes mellitus without complication, without long-term current use of insulin  (HCC) 05/02/2020   Class 1 obesity due to excess calories with body mass index (BMI) of 34.0 to 34.9 in adult 05/01/2020   Hypertension associated with diabetes (HCC) 08/17/2019   Frequent headaches 05/15/2019   Hyperlipidemia associated with type 2 diabetes mellitus (HCC) 12/21/2018   History of CVA (cerebrovascular accident) without residual deficits 10/05/2017   Gastroesophageal reflux disease    Insomnia 05/30/2014   Anxiety 05/26/2011    ONSET DATE: Referral: 09/30/23 Per MR review CTSx 04/03/2018 (L) and 05/29/2019 (R)  REFERRING DIAG: Fine motor impairment  THERAPY DIAG:  Other symptoms and signs involving the nervous system  Other lack of coordination  Other symptoms and signs involving the musculoskeletal system  Radiculopathy, cervicothoracic region  Other disturbances of skin sensation  Rationale for Evaluation and Treatment: Rehabilitation  SUBJECTIVE:   SUBJECTIVE STATEMENT: I wore my splints a couple nights, but when I woke up, I could barely move my hands.   Patient underwent carpal tunnel release surgeries a few years ago (between March 2020 and April 2021). Postoperatively, the patient's pain decreased but he has been experiencing ongoing tingling and numbness in BUE more so in the mornings upon waking up. He previously wore splints at night and noted some improvement in symptoms. Currently, the patient denied wearing splints at night or at work. Numbness and tingling persists in both hands, with more pronounced difficulty and functional limitations in the right hand.  Pt accompanied by: self  PERTINENT HISTORY: Anxiety, GERD, CVA, headaches, HTN, DM2, CTS s/p bilateral release 2020 and 2021, left THA 2016, hernia repair x2 2013 & 2018, ACDF C3-C7 2020   PRECAUTIONS: Fall  WEIGHT BEARING RESTRICTIONS: No  PAIN:  Are you having pain? No pain; however, patient reported frequent tingling and numbness in both hands, rated up to 7/10, during which the patient is  unable to complete tasks. Symptoms are noticeable when performing tasks that require a lot of hands-on activity or fine motor skills. At best he rates sensory deficits at a 2/10.  FALLS: Has patient fallen in last 6 months? Yes. Number of falls 2 or 3 pt has tripped a few times while at work.   LIVING ENVIRONMENT: Lives with: brother Lives in: House/apartment Stairs: No Has following equipment at home: None  PLOF: Independent  PATIENT GOALS: Wants to improve functional use of BUE particularly with picking up and manipulating small objects as well as to improve sensation of hands.  OBJECTIVE:  Note: Objective measures were completed at Evaluation unless otherwise noted.  HAND DOMINANCE: Left  ADLs: Overall ADLs: Mod I; Has a hard time picking up small objects. Grooming: Mod I; hard time picking up items  Equipment: none  IADLs: Shopping: Mod I Light housekeeping: Mod I Meal Prep: Has a hard time picking up pans and other objects.  Community mobility: Independent; drives himself Medication management: Mod I Financial management: Mod I Handwriting: 100% legible; has a hard time writing on surfaces that are not flat and stable.   MOBILITY STATUS: Independent  POSTURE COMMENTS:  No Significant postural limitations Sitting balance: Moves/returns truncal midpoint >2 inches in all planes  ACTIVITY TOLERANCE: Activity tolerance: Pt has not noticed any changes in his activity tolerance.   FUNCTIONAL OUTCOME MEASURES: TBD  UPPER EXTREMITY ROM:   All- WFL  UPPER EXTREMITY MMT:    All- WFL  HAND FUNCTION: Grip strength: Right: 86.4, 89.9, 87.9 lbs; Left: 90.3, 96.7, 107.8 lbs Average Right: 88.1 lbs Left: 98.3 lbs  COORDINATION: 9 Hole Peg test: Right: 33.2 sec; Left: 35.9 sec Box and Blocks:  Right 58blocks, Left 57blocks Had some trouble with picking up pegs with both hands.   SENSATION: Patient reports frequent tingling and numbness in both hands. When assessed,  some ulnar nerve involvement/damage was noted. Additionally, the patient describes a sensation like sandpaper when the affected area (from the wrist up to the fingertips) is touched.  EDEMA: Some noted in both hands  MUSCLE TONE: None noted  COGNITION: Overall cognitive status: Within functional limits for tasks assessed  VISION: Baseline vision: Bifocals and Wears glasses all the time  VISION ASSESSMENT: Not tested  PERCEPTION: WFL  PRAXIS: WFL  OBSERVATIONS: Pt ambulates with no use of AE. No loss of balance. The pt appears well kept and has glasses donned.                                                                                                                      TREATMENT DATE: 11/17/2023  (Treatment initiated by O.T. then completed by O.T. student under direct supervision by this O.T.)   - Self-care/home management  Discussed/reviewed bed positioning particularly for sleeping on Lt side - recommended pillow under Lt distal arm, and pillow under Rt arm. Pt also instructed to avoid sleeping with elbows in full flexion and/or sleeping on hands.   Pt also brought in splints (wrist cock up braces) - good fit. Reviewed wear and care including proper washing instructions. Recommended wearing prn during the day (only if safe during job) and at night only if supported on pillow  - Therapeutic exercises completed for duration as noted below including:  Reviewed tendon gliding exercises/handout with review of motions to isolate DIP, PIP and MCP joints for straight finger position, hook (DIP/PIP flexion), fist (DIP/PIP/MCP flexion), taco/duck (MCP flexion only) and flat fist (MCP and PIP flexion).   OT initiated median and ulnar nerve glides to stretch the median and ulnar nerves aiming to reduce the compression that is causing tingling in the patients fingers. Please see pt's instructions for more information on the specific exercises given. Patient has been encouraged to start off  with doing the exercises 5 times a day and  work his way up to doing them 10 times a day.    PATIENT EDUCATION: Education details: Median and ulnar nerve glides, reviewed tendon glides Person educated: Patient Education method: Explanation, Demonstration, Tactile cues, Verbal cues, and Handouts Education comprehension: verbalized understanding, returned demonstration, verbal cues required, tactile cues required, and needs further education  HOME EXERCISE PROGRAM: 11/10/2023: Tendon glides, sleep positioning 11/17/2023: Median and ulnar nerve glides   GOALS: Goals reviewed with patient? Yes  LONG TERM GOALS: Target date: 12/10/2023  Patient will demonstrate updated B UE HEP with visual handouts only for proper execution.  Baseline: New to outpatient OT Goal status:MET 11/17/2023  2.  Patient will demo improved FM coordination as evidenced by completing nine-hole peg with use of B UE in 30 seconds or less.  Baseline: 9 Hole Peg test: Right: 33.2 sec; Left: 35.9 sec Goal status: IN PROGRESS  3.  Pt will independently recall at least 3 total joint protection, ergonomic, and body mechanic principles.   Baseline: New to outpatient OT  Goal status: IN PROGRESS  4.  Patient will demonstrate proper edema management techniques, including elevating the hands during rest and sleep, and using appropriate positioning strategies. Baseline: New to outpatient OT Goal status: IN PROGRESS  5.  Pt will be independent with splint wear and care for B UE.  Baseline: Pt does not wear splints as much as he use to. Goal status: IN PROGRESS  6.  Pt will independently recall the 5 main sensory precautions (cold, heat, sharp, chemical, and heavy) as needed to prevent injury/harm secondary to impairments.   Baseline: Pt has some sensory deficits; tingling and numbness Goal status: IN PROGRESS  ASSESSMENT:  CLINICAL IMPRESSION:  Patient presents with tingling and numbness in B UE secondary to carpal  tunnel release. Pt demonstrates good rehab potential as evidence to verbalizing and demonstrating understanding of his HEP. Pt also identified ways that he can position his arms during the day and at night to decrease the tingling and numbness in his fingers. He will continue to benefit from skilled outpatient OT to improve functional independence for ADLs and IADLs.    PERFORMANCE DEFICITS: in functional skills including ADLs, IADLs, coordination, dexterity, sensation, edema, ROM, Fine motor control, Gross motor control, body mechanics, decreased knowledge of precautions, decreased knowledge of use of DME, and UE functional use. IMPAIRMENTS: are limiting patient from ADLs, IADLs, work, and leisure.   CO-MORBIDITIES: may have co-morbidities  that affects occupational performance. Patient will benefit from skilled OT to address above impairments and improve overall function.  REHAB POTENTIAL: Good  PLAN:  OT FREQUENCY: 1x/week  OT DURATION: 4 weeks + eval/tx  PLANNED INTERVENTIONS: 97168 OT Re-evaluation, 97535 self care/ADL training, 02889 therapeutic exercise, 97530 therapeutic activity, 97112 neuromuscular re-education, 97140 manual therapy, 97035 ultrasound, 97018 paraffin, 02960 fluidotherapy, 97010 moist heat, 97010 cryotherapy, 97034 contrast bath, 97032 electrical stimulation (manual), 97750 Physical Performance Testing, 02239 Orthotic Initial, [id number] Orthotic/Prosthetic subsequent, manual lymph drainage, passive range of motion, functional mobility training, energy conservation, coping strategies training, patient/family education, and DME and/or AE instructions  RECOMMENDED OTHER SERVICES: Pt is currently seeing PT as well. No further recommended services at the time of eval. (Maybe potential follow-up with neurologist)   CONSULTED AND AGREED WITH PLAN OF CARE: Patient  PLAN FOR NEXT SESSIONS:  2) Coordination handout; Joint protection (AE considerations)/ Sensory precautions 3)  ROM/putty exercises 4) DC - assess goals   NaMiyah Reddicks, OTS 11/17/2023, 11:17 AM  [name], OT 11/17/2023, 11:17 AM

## 2023-11-17 NOTE — Patient Instructions (Signed)
 SABRA

## 2023-11-22 ENCOUNTER — Other Ambulatory Visit: Payer: Self-pay | Admitting: Internal Medicine

## 2023-11-22 ENCOUNTER — Other Ambulatory Visit: Payer: Self-pay

## 2023-11-22 DIAGNOSIS — K219 Gastro-esophageal reflux disease without esophagitis: Secondary | ICD-10-CM

## 2023-11-22 NOTE — Telephone Encounter (Signed)
 Requested Prescriptions  Refused Prescriptions Disp Refills   pantoprazole  (PROTONIX ) 40 MG tablet 180 tablet 0    Sig: Take 1 tablet (40 mg total) by mouth 2 (two) times daily.     Gastroenterology: Proton Pump Inhibitors Passed - 11/22/2023  1:47 PM      Passed - Valid encounter within last 12 months    Recent Outpatient Visits           2 weeks ago Type 2 diabetes mellitus without complication, without long-term current use of insulin [facility])   [place] [facility], Angeline ORN, NP   6 months ago Encounter for general adult medical examination with abnormal findings   [place] [facility], Angeline ORN, NP

## 2023-11-23 ENCOUNTER — Telehealth: Payer: Self-pay

## 2023-11-23 NOTE — Telephone Encounter (Signed)
 Copied from CRM [id number]. Topic: General - Other >> Nov 23, 2023 11:21 AM Dedra B wrote: Reason for CRM: Pt needs proof that he received his flu shot sent to [facility].

## 2023-11-23 NOTE — Telephone Encounter (Signed)
 Spoke with patient he will come by the office and pick up a copy of immunization record

## 2023-11-24 ENCOUNTER — Ambulatory Visit: Admitting: Occupational Therapy

## 2023-11-24 ENCOUNTER — Ambulatory Visit: Admitting: Physical Therapy

## 2023-11-25 ENCOUNTER — Other Ambulatory Visit: Payer: Self-pay

## 2023-12-01 ENCOUNTER — Ambulatory Visit: Payer: Self-pay | Admitting: Occupational Therapy

## 2023-12-01 DIAGNOSIS — M546 Pain in thoracic spine: Secondary | ICD-10-CM | POA: Diagnosis not present

## 2023-12-01 DIAGNOSIS — R29818 Other symptoms and signs involving the nervous system: Secondary | ICD-10-CM

## 2023-12-01 DIAGNOSIS — M5413 Radiculopathy, cervicothoracic region: Secondary | ICD-10-CM | POA: Diagnosis not present

## 2023-12-01 DIAGNOSIS — R29898 Other symptoms and signs involving the musculoskeletal system: Secondary | ICD-10-CM | POA: Diagnosis not present

## 2023-12-01 DIAGNOSIS — R208 Other disturbances of skin sensation: Secondary | ICD-10-CM | POA: Diagnosis not present

## 2023-12-01 DIAGNOSIS — R278 Other lack of coordination: Secondary | ICD-10-CM | POA: Diagnosis not present

## 2023-12-01 DIAGNOSIS — M542 Cervicalgia: Secondary | ICD-10-CM | POA: Diagnosis not present

## 2023-12-01 NOTE — Patient Instructions (Signed)
 [name]

## 2023-12-01 NOTE — Therapy (Addendum)
 OUTPATIENT OCCUPATIONAL THERAPY NEURO  TREATMENT  Patient Name: [name] MRN: [id number] DOB:Dec 14, 1970, 53 y.o., male Today's Date: 12/01/2023  PCP: Antonette Angeline LELON, NP REFERRING PROVIDER: Antonette Angeline LELON, NP  END OF SESSION:  OT End of Session - 12/01/23 0755     Visit Number 3    Number of Visits 5    Date for Recertification  12/10/23    Authorization Type [facility] [employer]; Auth not required    Authorization Time Period 09/30/2023  -  09/29/2024    OT Start Time 0800    OT Stop Time 0845    OT Time Calculation (min) 45 min    Equipment Utilized During Treatment coordination items    Activity Tolerance Patient tolerated treatment well    Behavior During Therapy [facility] for tasks assessed/performed          Past Medical History:  Diagnosis Date   Anxiety 2018   takes Xanax  daily prn, panic attacks   Chronic gastric ulcer with bleeding s/p suture repair 2012 09/29/2010   Head 3 EGD which failed to stop the bleeding. Had exploratory laparotomy on 10/04/2010. Possible cause of the gastric ulcer was NSAID use.    Clotting disorder 2012   Constipation    related to pain meds   Depression    Diabetes mellitus without complication (HCC)    Dizziness    occasionally   Dyspnea    walking a bit   Erosive esophagitis    External hemorrhoid, bleeding    Gastric ulcer    history of   GERD (gastroesophageal reflux disease) 2012   takes Protonix  bid   Hiatal hernia    History of blood clots 2012   in abdomen   History of blood transfusion 2012   Hyperlipidemia    Hypertension 2021   Incisional hernia s/p open repair w mesh Aug 2013 05/26/2011   Joint pain    knees   Nocturia    depends on amount of fluid he drinks   Pneumonia 2018   Pre-diabetes    Primary localized osteoarthritis of left hip 04/24/2014   Serrated polyp of colon    Sleep apnea 2016   no cpap  mild no cpap needed   Stroke (HCC) 2019   Unstable angina (HCC) 04/09/2020   Upper GI bleed     Ventral hernia    Past Surgical History:  Procedure Laterality Date   ANTERIOR CERVICAL DECOMPRESSION/DISCECTOMY FUSION 4 LEVELS N/A 04/08/2018   Procedure: ACDF - C3-C4 - C4-C5 - C5-C6 - C6-C7;  Surgeon: Onetha Kuba, MD;  Location: [facility] OR;  Service: Neurosurgery;  Laterality: N/A;   CARPAL TUNNEL RELEASE Left 04/03/2018   COLONOSCOPY     COLONOSCOPY  07/15/2021   ESOPHAGEAL MANOMETRY N/A 10/19/2016   Procedure: ESOPHAGEAL MANOMETRY (EM);  Surgeon: Shila Gustav GAILS, MD;  Location: [facility] ENDOSCOPY;  Service: Endoscopy;  Laterality: N/A;   ESOPHAGOGASTRODUODENOSCOPY     ESOPHAGOGASTRODUODENOSCOPY N/A 01/22/2017   Procedure: ESOPHAGOGASTRODUODENOSCOPY (EGD);  Surgeon: Sheldon Standing, MD;  Location: [facility] ORS;  Service: General;  Laterality: N/A;   HERNIA REPAIR  2018   INCISIONAL HERNIA REPAIR  09/04/2011   Procedure: HERNIA REPAIR INCISIONAL;  Surgeon: Redell Faith, DO;  Location: [facility] OR;  Service: General;  Laterality: N/A;  debridment calcified mass   INSERTION OF MESH  09/04/2011   retrorectus ultrapro 30x30cm   INSERTION OF MESH N/A 01/22/2017   Procedure: INSERTION OF MESH;  Surgeon: Sheldon Standing, MD;  Location: [facility] ORS;  Service: General;  Laterality: N/A;   JOINT REPLACEMENT  2016   LEFT HEART CATH AND CORONARY ANGIOGRAPHY N/A 04/11/2020   Procedure: LEFT HEART CATH AND CORONARY ANGIOGRAPHY;  Surgeon: Court Dorn PARAS, MD;  Location: [facility] INVASIVE CV LAB;  Service: Cardiovascular;  Laterality: N/A;   NISSEN FUNDOPLICATION     Open 1990s   POLYPECTOMY     STOMACH SURGERY  10/05/2010   Oversewing of gastric ulcer.  Dr Vanderbilt   TOTAL HIP ARTHROPLASTY Left 04/24/2014   Procedure: LEFT TOTAL HIP ARTHROPLASTY;  Surgeon: Fonda Olmsted, MD;  Location: [facility] OR;  Service: Orthopedics;  Laterality: Left;   UPPER GASTROINTESTINAL ENDOSCOPY     Patient Active Problem List   Diagnosis Date Noted   Carpal tunnel syndrome 08/27/2020   Type 2 diabetes mellitus without complication, without long-term  current use of insulin (HCC) 05/02/2020   Class 1 obesity due to excess calories with body mass index (BMI) of 34.0 to 34.9 in adult 05/01/2020   Hypertension associated with diabetes (HCC) 08/17/2019   Frequent headaches 05/15/2019   Hyperlipidemia associated with type 2 diabetes mellitus (HCC) 12/21/2018   History of CVA (cerebrovascular accident) without residual deficits 10/05/2017   Gastroesophageal reflux disease    Insomnia 05/30/2014   Anxiety 05/26/2011    ONSET DATE: Referral: 09/30/23 Per MR review CTSx 04/03/2018 (L) and 05/29/2019 (R)  REFERRING DIAG: Fine motor impairment  THERAPY DIAG:  Other lack of coordination  Other disturbances of skin sensation  Fine motor impairment  Other symptoms and signs involving the nervous system  Other symptoms and signs involving the musculoskeletal system  Rationale for Evaluation and Treatment: Rehabilitation  SUBJECTIVE:   SUBJECTIVE STATEMENT: Patient reports, "my hands are tingling this morning." States that he did not wear splints last night. Also reports he has been performing median and ulnar nerve glides consistently and reports they have been going well.  Pt accompanied by: self  PERTINENT HISTORY: Anxiety, GERD, CVA, headaches, HTN, DM2, CTS s/p bilateral release 2020 and 2021, left THA 2016, hernia repair x2 2013 & 2018, ACDF C3-C7 2020   PRECAUTIONS: Fall  WEIGHT BEARING RESTRICTIONS: No  PAIN:  Are you having pain? No pain; however, patient reported frequent tingling and numbness in both hands,  during which the patient is unable to complete tasks. Symptoms are noticeable when performing tasks that require a lot of hands-on activity or fine motor skills. At best he rates sensory deficits at a 2/10.  FALLS: Has patient fallen in last 6 months? Yes. Number of falls 2 or 3 pt has tripped a few times while at work.   LIVING ENVIRONMENT: Lives with: brother Lives in: House/apartment Stairs: No Has following  equipment at home: None  PLOF: Independent  PATIENT GOALS: Wants to improve functional use of BUE particularly with picking up and manipulating small objects as well as to improve sensation of hands.  OBJECTIVE:  Note: Objective measures were completed at Evaluation unless otherwise noted.  HAND DOMINANCE: Left  ADLs: Overall ADLs: Mod I; Has a hard time picking up small objects. Grooming: Mod I; hard time picking up items  Equipment: none  IADLs: Shopping: Mod I Light housekeeping: Mod I Meal Prep: Has a hard time picking up pans and other objects.  Community mobility: Independent; drives himself Medication management: Mod I Financial management: Mod I Handwriting: 100% legible; has a hard time writing on surfaces that are not flat and stable.   MOBILITY STATUS: Independent  POSTURE COMMENTS:  No Significant postural  limitations Sitting balance: Moves/returns truncal midpoint >2 inches in all planes  ACTIVITY TOLERANCE: Activity tolerance: Pt has not noticed any changes in his activity tolerance.   FUNCTIONAL OUTCOME MEASURES: TBD  UPPER EXTREMITY ROM:   All- WFL  UPPER EXTREMITY MMT:    All- WFL  HAND FUNCTION: Grip strength: Right: 86.4, 89.9, 87.9 lbs; Left: 90.3, 96.7, 107.8 lbs Average Right: 88.1 lbs Left: 98.3 lbs  COORDINATION: 9 Hole Peg test: Right: 33.2 sec; Left: 35.9 sec Box and Blocks:  Right 58 blocks, Left 57 blocks Had some trouble with picking up pegs with both hands.   SENSATION: Patient reports frequent tingling and numbness in both hands. When assessed, some ulnar nerve involvement/damage was noted. Additionally, the patient describes a sensation like sandpaper when the affected area (from the wrist up to the fingertips) is touched.  EDEMA: Some noted in both hands  MUSCLE TONE: None noted  COGNITION: Overall cognitive status: Within functional limits for tasks assessed  VISION: Baseline vision: Bifocals and Wears glasses all the  time  VISION ASSESSMENT: Not tested  PERCEPTION: WFL  PRAXIS: WFL  OBSERVATIONS: Pt ambulates with no use of AE. No loss of balance. The pt appears well kept and has glasses donned.                                                                                                                      TREATMENT DATE: 12/01/2023  - Self-care/home management completed for duration as noted below including:  OT educated patient in Safety considerations for loss of sensation as noted in patient instructions to reduce risk of injury to affected hand.  Pt encouraged to be careful of sharp, hot/cold (check temperatures of water ), breakable, heavy objects and chemicals. Patient verbalized understanding. Handout provided.   OT also provided education on joint protection, ergonomics, and body mechanics principles, as outlined in the patient instructions, to support reduction of B UE pain and prevention of strain and discomfort related to median and ulnar nerve compression.  In addition, OT also discussed/reviewed bed positioning particularly for sleeping on Lt side - recommended pillow under Lt distal arm, and pillow under Rt arm. Pt also instructed to avoid sleeping with elbows in full flexion and/or sleeping on hands.  - Therapeutic exercises completed for duration as noted below including:  Pt reported difficulty with tying shoelaces and drawstrings. In addition, it was noted that the patient demonstrates difficulty with finger opposition, specifically touching the left thumb to the pinky. Therefore, OT provided the pt with a fine motor coordination HEP handout (see pt instructions) - to improve B UE FM coordination, dexterity, proprioception. Pt returned demonstration of each exercise: Picking up objects and placing in a container with slot- Finger-to-palm then palm-to-finger translation of small items- This specific task was modified as the patient demonstrated difficulty picking up and manipulating  smaller objects (pennies). The activity was graded down by encouraging the patient to use larger objects (checker pieces) and gradually progress toward manipulating smaller objects in  hand. Shuffling cards Turning cards over 1 at a time Hold deck of cards in palm of hand and push off 1 card at a time from the top of the deck using only thumb Tear piece of tissue and rolling into small balls with fingertips Meditation balls - clockwise then counterclockwise Rolling pen between fingers and thumb, pen twirl (rotation), pen shift (translation)   PATIENT EDUCATION: Education details: coordination handout, sensory precautions, joint protection, sleep positioning Person educated: Patient Education method: Explanation, Demonstration, Tactile cues, Verbal cues, and Handouts Education comprehension: verbalized understanding, returned demonstration, verbal cues required, tactile cues required, and needs further education  HOME EXERCISE PROGRAM: 11/10/2023: Tendon glides, sleep positioning 11/17/2023: Median and ulnar nerve glides 12/01/23: coordination, joint protection, sensory precautions   GOALS: Goals reviewed with patient? Yes  LONG TERM GOALS: Target date: 12/10/2023  Patient will demonstrate updated B UE HEP with visual handouts only for proper execution.  Baseline: New to outpatient OT Goal status:MET 11/17/2023  2.  Patient will demo improved FM coordination as evidenced by completing nine-hole peg with use of B UE in 30 seconds or less.  Baseline: 9 Hole Peg test: Right: 33.2 sec; Left: 35.9 sec Goal status: IN PROGRESS  3.  Pt will independently recall at least 3 total joint protection, ergonomic, and body mechanic principles.   Baseline: Received educational handout on 12/01/23 Goal status: IN PROGRESS  4.  Patient will demonstrate proper edema management techniques, including elevating the hands during rest and sleep, and using appropriate positioning strategies. Baseline:  New to outpatient OT Goal status: MET  5.  Pt will be independent with splint wear and care for B UE.  Baseline: Pt does not wear splints as much as he use to. Goal status: IN PROGRESS  6.  Pt will independently recall the 5 main sensory precautions (cold, heat, sharp, chemical, and heavy) as needed to prevent injury/harm secondary to impairments.   Baseline:  Received educational handout on 12/01/23 Goal status: IN PROGRESS  ASSESSMENT:  CLINICAL IMPRESSION:  Patient presents with tingling and numbness in B UE secondary to carpal tunnel and previous sx release. Pt demonstrates good rehab potential as evidence to verbalizing and demonstrating understanding of his HEP. Pt also identified ways that he can position his arms during the day and at night to decrease the tingling and numbness in his fingers. He will continue to benefit from skilled outpatient OT to improve functional independence for ADLs and IADLs.   PERFORMANCE DEFICITS: in functional skills including ADLs, IADLs, coordination, dexterity, sensation, edema, ROM, Fine motor control, Gross motor control, body mechanics, decreased knowledge of precautions, decreased knowledge of use of DME, and UE functional use. IMPAIRMENTS: are limiting patient from ADLs, IADLs, work, and leisure.   CO-MORBIDITIES: may have co-morbidities  that affects occupational performance. Patient will benefit from skilled OT to address above impairments and improve overall function.  REHAB POTENTIAL: Good  PLAN:  OT FREQUENCY: 1x/week  OT DURATION: 4 weeks + eval/tx  PLANNED INTERVENTIONS: 97168 OT Re-evaluation, 97535 self care/ADL training, 02889 therapeutic exercise, 97530 therapeutic activity, 97112 neuromuscular re-education, 97140 manual therapy, 97035 ultrasound, 97018 paraffin, 02960 fluidotherapy, 97010 moist heat, 97010 cryotherapy, 97034 contrast bath, 97032 electrical stimulation (manual), 97750 Physical Performance Testing, 02239 Orthotic  Initial, [id number] Orthotic/Prosthetic subsequent, manual lymph drainage, passive range of motion, functional mobility training, energy conservation, coping strategies training, patient/family education, and DME and/or AE instructions  RECOMMENDED OTHER SERVICES: Pt is currently seeing PT as well. No further recommended services at the  time of eval. (Maybe potential follow-up with neurologist)   CONSULTED AND AGREED WITH PLAN OF CARE: Patient  PLAN FOR NEXT SESSIONS:  3) ROM/putty exercises- finger opposition and gliding 4) DC - assess goals   Juliane Guest, Student-OT 12/01/2023, 11:35 AM

## 2023-12-07 ENCOUNTER — Other Ambulatory Visit: Payer: Self-pay | Admitting: Internal Medicine

## 2023-12-07 ENCOUNTER — Other Ambulatory Visit: Payer: Self-pay

## 2023-12-08 ENCOUNTER — Other Ambulatory Visit: Payer: Self-pay

## 2023-12-08 ENCOUNTER — Ambulatory Visit: Payer: Self-pay | Attending: Neurology | Admitting: Occupational Therapy

## 2023-12-08 DIAGNOSIS — R208 Other disturbances of skin sensation: Secondary | ICD-10-CM | POA: Diagnosis not present

## 2023-12-08 DIAGNOSIS — R29898 Other symptoms and signs involving the musculoskeletal system: Secondary | ICD-10-CM | POA: Diagnosis not present

## 2023-12-08 DIAGNOSIS — R278 Other lack of coordination: Secondary | ICD-10-CM | POA: Insufficient documentation

## 2023-12-08 DIAGNOSIS — R29818 Other symptoms and signs involving the nervous system: Secondary | ICD-10-CM | POA: Diagnosis not present

## 2023-12-08 MED ORDER — CYCLOBENZAPRINE HCL 10 MG PO TABS
10.0000 mg | ORAL_TABLET | Freq: Two times a day (BID) | ORAL | 0 refills | Status: DC | PRN
  Filled 2023-12-08: qty 60, 30d supply, fill #0

## 2023-12-08 NOTE — Telephone Encounter (Signed)
 Requested medication (s) are due for refill today: yes  Requested medication (s) are on the active medication list: yes  Last refill:  10/29/23  Future visit scheduled: no  Notes to clinic:  Unable to refill per protocol, cannot delegate.      Requested Prescriptions  Pending Prescriptions Disp Refills   cyclobenzaprine  (FLEXERIL ) 10 MG tablet 60 tablet 0    Sig: Take 1 tablet (10 mg total) by mouth 2 (two) times daily as needed.     Not Delegated - Analgesics:  Muscle Relaxants Failed - 12/08/2023 12:36 PM      Failed - This refill cannot be delegated      Passed - Valid encounter within last 6 months    Recent Outpatient Visits           1 month ago Type 2 diabetes mellitus without complication, without long-term current use of insulin [facility])   [place] [facility] [place], Angeline ORN, NP   7 months ago Encounter for general adult medical examination with abnormal findings   [place] [facility] [place], Angeline ORN, NP

## 2023-12-08 NOTE — Therapy (Signed)
 OUTPATIENT OCCUPATIONAL THERAPY NEURO  TREATMENT & DISCHARGE SUMMARY  Patient Name: [name] MRN: [id number] DOB:1970-07-19, 53 y.o., male Today's Date: 12/08/2023  PCP: Antonette Angeline LELON, NP REFERRING PROVIDER: Antonette Angeline LELON, NP  END OF SESSION:  OT End of Session - 12/08/23 0757     Visit Number 4    Number of Visits 5    Date for Recertification  12/10/23    Authorization Type [facility] [employer]; Auth not required    Authorization Time Period 09/30/2023  -  09/29/2024    OT Start Time 0802    OT Stop Time 0845    OT Time Calculation (min) 43 min    Equipment Utilized During Treatment putty    Activity Tolerance Patient tolerated treatment well    Behavior During Therapy [facility] for tasks assessed/performed          Past Medical History:  Diagnosis Date   Anxiety 2018   takes Xanax  daily prn, panic attacks   Chronic gastric ulcer with bleeding s/p suture repair 2012 09/29/2010   Head 3 EGD which failed to stop the bleeding. Had exploratory laparotomy on 10/04/2010. Possible cause of the gastric ulcer was NSAID use.    Clotting disorder 2012   Constipation    related to pain meds   Depression    Diabetes mellitus without complication (HCC)    Dizziness    occasionally   Dyspnea    walking a bit   Erosive esophagitis    External hemorrhoid, bleeding    Gastric ulcer    history of   GERD (gastroesophageal reflux disease) 2012   takes Protonix  bid   Hiatal hernia    History of blood clots 2012   in abdomen   History of blood transfusion 2012   Hyperlipidemia    Hypertension 2021   Incisional hernia s/p open repair w mesh Aug 2013 05/26/2011   Joint pain    knees   Nocturia    depends on amount of fluid he drinks   Pneumonia 2018   Pre-diabetes    Primary localized osteoarthritis of left hip 04/24/2014   Serrated polyp of colon    Sleep apnea 2016   no cpap  mild no cpap needed   Stroke (HCC) 2019   Unstable angina (HCC) 04/09/2020   Upper GI bleed     Ventral hernia    Past Surgical History:  Procedure Laterality Date   ANTERIOR CERVICAL DECOMPRESSION/DISCECTOMY FUSION 4 LEVELS N/A 04/08/2018   Procedure: ACDF - C3-C4 - C4-C5 - C5-C6 - C6-C7;  Surgeon: Onetha Kuba, MD;  Location: [facility] OR;  Service: Neurosurgery;  Laterality: N/A;   CARPAL TUNNEL RELEASE Left 04/03/2018   COLONOSCOPY     COLONOSCOPY  07/15/2021   ESOPHAGEAL MANOMETRY N/A 10/19/2016   Procedure: ESOPHAGEAL MANOMETRY (EM);  Surgeon: Shila Gustav GAILS, MD;  Location: [facility] ENDOSCOPY;  Service: Endoscopy;  Laterality: N/A;   ESOPHAGOGASTRODUODENOSCOPY     ESOPHAGOGASTRODUODENOSCOPY N/A 01/22/2017   Procedure: ESOPHAGOGASTRODUODENOSCOPY (EGD);  Surgeon: Sheldon Standing, MD;  Location: [facility] ORS;  Service: General;  Laterality: N/A;   HERNIA REPAIR  2018   INCISIONAL HERNIA REPAIR  09/04/2011   Procedure: HERNIA REPAIR INCISIONAL;  Surgeon: Redell Faith, DO;  Location: [facility] OR;  Service: General;  Laterality: N/A;  debridment calcified mass   INSERTION OF MESH  09/04/2011   retrorectus ultrapro 30x30cm   INSERTION OF MESH N/A 01/22/2017   Procedure: INSERTION OF MESH;  Surgeon: Sheldon Standing, MD;  Location: [facility] ORS;  Service: General;  Laterality: N/A;   JOINT REPLACEMENT  2016   LEFT HEART CATH AND CORONARY ANGIOGRAPHY N/A 04/11/2020   Procedure: LEFT HEART CATH AND CORONARY ANGIOGRAPHY;  Surgeon: Court Dorn PARAS, MD;  Location: [facility] INVASIVE CV LAB;  Service: Cardiovascular;  Laterality: N/A;   NISSEN FUNDOPLICATION     Open 1990s   POLYPECTOMY     STOMACH SURGERY  10/05/2010   Oversewing of gastric ulcer.  Dr Vanderbilt   TOTAL HIP ARTHROPLASTY Left 04/24/2014   Procedure: LEFT TOTAL HIP ARTHROPLASTY;  Surgeon: Fonda Olmsted, MD;  Location: [facility] OR;  Service: Orthopedics;  Laterality: Left;   UPPER GASTROINTESTINAL ENDOSCOPY     Patient Active Problem List   Diagnosis Date Noted   Carpal tunnel syndrome 08/27/2020   Type 2 diabetes mellitus without complication, without long-term  current use of insulin (HCC) 05/02/2020   Class 1 obesity due to excess calories with body mass index (BMI) of 34.0 to 34.9 in adult 05/01/2020   Hypertension associated with diabetes (HCC) 08/17/2019   Frequent headaches 05/15/2019   Hyperlipidemia associated with type 2 diabetes mellitus (HCC) 12/21/2018   History of CVA (cerebrovascular accident) without residual deficits 10/05/2017   Gastroesophageal reflux disease    Insomnia 05/30/2014   Anxiety 05/26/2011    ONSET DATE: Referral: 09/30/23 Per MR review CTSx 04/03/2018 (L) and 05/29/2019 (R)  REFERRING DIAG: Fine motor impairment  THERAPY DIAG:  Other lack of coordination  Other disturbances of skin sensation  Fine motor impairment  Other symptoms and signs involving the nervous system  Rationale for Evaluation and Treatment: Rehabilitation  SUBJECTIVE:   SUBJECTIVE STATEMENT: Patient reported he wore his splints last night and his hands were better but they still get tingly.  Pt is aware of today being last OT visit and is satisfied with education and HEP recommendations.  Pt accompanied by: self  PERTINENT HISTORY: Anxiety, GERD, CVA, headaches, HTN, DM2, CTS s/p bilateral release 2020 and 2021, left THA 2016, hernia repair x2 2013 & 2018, ACDF C3-C7 2020   PRECAUTIONS: Fall  WEIGHT BEARING RESTRICTIONS: No  PAIN:  Are you having pain? No pain; however, patient reported frequent tingling and numbness in both hands,  during which the patient is unable to complete tasks. Symptoms are noticeable when performing tasks that require a lot of hands-on activity or fine motor skills. At best he rates sensory deficits at a 2/10.  FALLS: Has patient fallen in last 6 months? Yes. Number of falls 2 or 3 pt has tripped a few times while at work.   LIVING ENVIRONMENT: Lives with: brother Lives in: House/apartment Stairs: No Has following equipment at home: None  PLOF: Independent  PATIENT GOALS: Wants to improve functional  use of BUE particularly with picking up and manipulating small objects as well as to improve sensation of hands.  OBJECTIVE:  Note: Objective measures were completed at Evaluation unless otherwise noted.  HAND DOMINANCE: Left  ADLs: Overall ADLs: Mod I; Has a hard time picking up small objects. Grooming: Mod I; hard time picking up items  Equipment: none  IADLs: Shopping: Mod I Light housekeeping: Mod I Meal Prep: Has a hard time picking up pans and other objects.  Community mobility: Independent; drives himself Medication management: Mod I Financial management: Mod I Handwriting: 100% legible; has a hard time writing on surfaces that are not flat and stable.   MOBILITY STATUS: Independent  POSTURE COMMENTS:  No Significant postural limitations Sitting balance: Moves/returns truncal midpoint >2 inches in  all planes  ACTIVITY TOLERANCE: Activity tolerance: Pt has not noticed any changes in his activity tolerance.   FUNCTIONAL OUTCOME MEASURES: TBD  UPPER EXTREMITY ROM:   All- WFL  UPPER EXTREMITY MMT:    All- WFL  HAND FUNCTION: Grip strength: Right: 86.4, 89.9, 87.9 lbs; Left: 90.3, 96.7, 107.8 lbs Average Right: 88.1 lbs Left: 98.3 lbs  COORDINATION: 9 Hole Peg test: Right: 33.2 sec; Left: 35.9 sec Box and Blocks:  Right 58 blocks, Left 57 blocks Had some trouble with picking up pegs with both hands.   DC 12/08/23: Right 27.02 sec Left 27.24, 25.59 one drop each time  SENSATION: Patient reports frequent tingling and numbness in both hands. When assessed, some ulnar nerve involvement/damage was noted. Additionally, the patient describes a sensation like sandpaper when the affected area (from the wrist up to the fingertips) is touched.  EDEMA: Some noted in both hands  MUSCLE TONE: None noted  COGNITION: Overall cognitive status: Within functional limits for tasks assessed  VISION: Baseline vision: Bifocals and Wears glasses all the time  VISION  ASSESSMENT: Not tested  PERCEPTION: WFL  PRAXIS: WFL  OBSERVATIONS: Pt ambulates with no use of AE. No loss of balance. The pt appears well kept and has glasses donned.                                                                                                                      TREATMENT DATE: 12/01/2023  - Self-care/home management completed for duration as noted below including:   OT educated patient on sleep positioning as previously discussed with additional images reviewed for pillow placement options to reduce stress to upper extremity nerves, which can be attributing to reported paresthesias and pain in BUEs. Patient verbalized understanding.   Therapist reviewed goals with patient and updated patient progression.  No additional functional limitations identified. Reviewed Safety considerations for loss of sensation as well as joint protection, ergonomics, and body mechanics principles, to support reduction of B UE pain and prevention of strain and discomfort related to median and ulnar nerve compression.  - Therapeutic activities completed for duration as noted below including:  Initiated Putty Exercises with green putty to maintain strength and improve coordination and sensory stimulation of B UEs.  Patient provided visual demonstration, verbal and tactile cues as needed to improve performance of the various exercises/activities including:   - Putty Squeezes - cues to squeeze putty into log for use with other exercises and to fold putty in half with 1 hand  - Putty Rolls - encourage to roll putty into logs with sensory stimulation to entire length of hand, fingers and wrist as needed   - Pinch and Pull with Putty - this motion is combined with different pinches (3-Point Pinch, Tip Pinch, Key Pinch) - patient encouraged to combine tripod, pincer and/or key pinch with pinch and pull motion of putty pulling away from midline, changing between different pinches and changing  different directions to change grip  - Finger Extension with  Putty - pt shown how to work on task with all fingers and thumb as well as individual fingers in opposition to thumb  - Finger Adduction with Putty - pt shown how to work on weaving putty between fingers/thumb and then squeeze fingers together while laying hand flat on table top.  - Removing Objects from Putty  - encouraged to hide items (coins, marble, dice etc) and use one hand at a time to find the objects and identify them by tactile input before s/he digs them out and can see them visually.  OT educated patient on theraputty recommendations: avoid small children, pets, hot environments, place in designated container and avoid contact with fabrics. Patient verbalized understanding.    Patient benefited from extra time, verbal/tactile cues, and modeling of task to allow time for processing of verbal instructions and improve motor planning of unfamiliar movements.   PATIENT EDUCATION: Education details: DC instructions, putty activities Person educated: Patient Education method: Explanation, Demonstration, Tactile cues, Verbal cues, and Handouts Education comprehension: verbalized understanding, returned demonstration, and verbal cues required  HOME EXERCISE PROGRAM: 11/10/2023: Tendon glides, sleep positioning 11/17/2023: Median and ulnar nerve glides 12/01/23: coordination, joint protection, sensory precautions 12/08/23: Putty Exercises: Access Code: [email or web address] (pt scanned to his app)  GOALS: Goals reviewed with patient? Yes  LONG TERM GOALS: Target date: 12/10/2023  Patient will demonstrate updated B UE HEP with visual handouts only for proper execution.  Baseline: New to outpatient OT Goal status:MET 11/17/2023  2.  Patient will demo improved FM coordination as evidenced by completing nine-hole peg with use of B UE in 30 seconds or less.  Baseline: 9 Hole Peg test: Right: 33.2 sec; Left: 35.9 sec Goal status: MET  3.   Pt will independently recall at least 3 total joint protection, ergonomic, and body mechanic principles.   Baseline: Received educational handout on 12/01/23 Goal status: MET - splints, over doing it, more aware of position   4.  Patient will demonstrate proper edema management techniques, including elevating the hands during rest and sleep, and using appropriate positioning strategies. Baseline: New to outpatient OT Goal status: MET  5.  Pt will be independent with splint wear and care for B UE.  Baseline: Pt does not wear splints as much as he use to. Goal status: MET  6.  Pt will independently recall the 5 main sensory precautions (cold, heat, sharp, chemical, and heavy) as needed to prevent injury/harm secondary to impairments.   Baseline:  Received educational handout on 12/01/23 Goal status: MET  ASSESSMENT:  CLINICAL IMPRESSION:  Patient presents with ongoing tingling and numbness in B UE secondary to carpal tunnel and previous sx release.  He has begun to use his splints more often and is distinctly aware of hand positioning to protect his hands. Patient is appropriate for discharge and no longer demonstrates medical necessity for continued skilled occupational therapy services.  PERFORMANCE DEFICITS: in functional skills including ADLs, IADLs, coordination, dexterity, sensation, edema, ROM, Fine motor control, Gross motor control, body mechanics, decreased knowledge of precautions, decreased knowledge of use of DME, and UE functional use. IMPAIRMENTS: are limiting patient from ADLs, IADLs, work, and leisure.   CO-MORBIDITIES: may have co-morbidities  that affects occupational performance. Patient will benefit from skilled OT to address above impairments and improve overall function.  REHAB POTENTIAL: Good  PLAN:  OCCUPATIONAL THERAPY DISCHARGE SUMMARY  Visits from Start of Care: 4  Current functional level related to goals / functional outcomes: Pt has met all goals to  satisfactory levels and is pleased with outcomes.   Remaining deficits: Pt has no more significant functional deficits or pain, although he still has sensory changes and needs to continue with nighttime splint wear and daytime use with repetitive tasks as well as HEP recommendations and joint protection principles.  Education / Equipment: Pt has all needed materials and education. Pt understands how to continue on with self-management. See tx notes for more details.   Patient agrees to discharge due to max benefits received from outpatient occupational therapy at this time.     Clarita LITTIE Pride, OT 12/08/2023, 6:16 PM

## 2023-12-08 NOTE — Patient Instructions (Signed)
 Access Code: [email or web address] URL: [email or web address] Date: 12/08/2023 Prepared by: Clarita Pride  Exercises - Putty Squeezes  - 1-2 x daily - 10 reps - Rolling Putty on Table  - 1-2 x daily - 10 reps - Finger Pinch and Pull with Putty  - 1-2 x daily - 10 reps - 3-Point Pinch with Putty  - 1-2 x daily - 10 reps - Tip PUSH with Putty  - 1-2 x daily - 10 reps - Key Pinch with Putty  - 1-2 x daily - 10 reps - Finger Extension with Putty  - 1-2 x daily - 10 reps - Finger Adduction with Putty  - 1-2 x daily - 10 reps - Removing Marbles from Putty  - 1-2 x daily - 10 reps

## 2023-12-10 ENCOUNTER — Other Ambulatory Visit: Payer: Self-pay | Admitting: Internal Medicine

## 2023-12-10 ENCOUNTER — Other Ambulatory Visit: Payer: Self-pay

## 2023-12-10 DIAGNOSIS — F5101 Primary insomnia: Secondary | ICD-10-CM

## 2023-12-10 DIAGNOSIS — F419 Anxiety disorder, unspecified: Secondary | ICD-10-CM

## 2023-12-11 ENCOUNTER — Other Ambulatory Visit: Payer: Self-pay

## 2023-12-11 MED ORDER — CARVEDILOL 3.125 MG PO TABS
3.1250 mg | ORAL_TABLET | Freq: Two times a day (BID) | ORAL | 0 refills | Status: AC
  Filled 2023-12-11: qty 180, 90d supply, fill #0

## 2023-12-11 MED ORDER — AMLODIPINE BESYLATE 10 MG PO TABS
10.0000 mg | ORAL_TABLET | Freq: Every day | ORAL | 0 refills | Status: AC
  Filled 2023-12-11: qty 90, 90d supply, fill #0

## 2023-12-11 NOTE — Telephone Encounter (Signed)
 Requested medication (s) are due for refill today: yes  Requested medication (s) are on the active medication list: yes  Last refill:  11/11/23  Future visit scheduled: yes  Notes to clinic:  Unable to refill per protocol, cannot delegate.      Requested Prescriptions  Pending Prescriptions Disp Refills   zolpidem  (AMBIEN  CR) 12.5 MG CR tablet 30 tablet 0    Sig: Take 1 tablet (12.5 mg total) by mouth at bedtime.     Not Delegated - Psychiatry:  Anxiolytics/Hypnotics Failed - 12/11/2023 10:08 AM      Failed - This refill cannot be delegated      Failed - Urine Drug Screen completed in last 360 days      Passed - Valid encounter within last 6 months    Recent Outpatient Visits           1 month ago Type 2 diabetes mellitus without complication, without long-term current use of insulin [facility])   [place] [facility] [place], Angeline ORN, NP   7 months ago Encounter for general adult medical examination with abnormal findings   [facility] [place], Angeline ORN, NP               clonazePAM  (KLONOPIN ) 0.5 MG tablet 30 tablet 0    Sig: Take 1 tablet (0.5 mg total) by mouth at bedtime.     Not Delegated - Psychiatry: Anxiolytics/Hypnotics 2 Failed - 12/11/2023 10:08 AM      Failed - This refill cannot be delegated      Failed - Urine Drug Screen completed in last 360 days      Passed - Patient is not pregnant      Passed - Valid encounter within last 6 months    Recent Outpatient Visits           1 month ago Type 2 diabetes mellitus without complication, without long-term current use of insulin (HCC)   [place] [facility] [place], [place], NP   7 months ago Encounter for general adult medical examination with abnormal findings   [place] [facility], Angeline ORN, NP              Signed Prescriptions Disp Refills   carvedilol  (COREG ) 3.125 MG tablet 180 tablet 0    Sig: Take 1 tablet  (3.125 mg total) by mouth 2 (two) times daily with a meal.     Cardiovascular: Beta Blockers 3 Passed - 12/11/2023 10:08 AM      Passed - Cr in normal range and within 360 days    Creat  Date Value Ref Range Status  11/04/2023 0.87 0.70 - 1.30 mg/dL Final   Creatinine, Urine  Date Value Ref Range Status  11/04/2023 89 20 - 320 mg/dL Final         Passed - AST in normal range and within 360 days    AST  Date Value Ref Range Status  11/04/2023 23 10 - 35 U/L Final         Passed - ALT in normal range and within 360 days    ALT  Date Value Ref Range Status  11/04/2023 31 9 - 46 U/L Final         Passed - Last BP in normal range    BP Readings from Last 1 Encounters:  11/04/23 134/84         Passed - Last Heart Rate  in normal range    Pulse Readings from Last 1 Encounters:  09/29/23 84         Passed - Valid encounter within last 6 months    Recent Outpatient Visits           1 month ago Type 2 diabetes mellitus without complication, without long-term current use of insulin [facility])   [place] [facility] [place], Angeline ORN, NP   7 months ago Encounter for general adult medical examination with abnormal findings   [place] [facility] [place], Angeline ORN, NP               amLODipine  (NORVASC ) 10 MG tablet 90 tablet 0    Sig: Take 1 tablet (10 mg total) by mouth daily. Please call our to schedule an overdue appointment with Dr. Hobart before anymore refills. [phone]. Thank you. 3rd attempt.     Cardiovascular: Calcium  Channel Blockers 2 Passed - 12/11/2023 10:08 AM      Passed - Last BP in normal range    BP Readings from Last 1 Encounters:  11/04/23 134/84         Passed - Last Heart Rate in normal range    Pulse Readings from Last 1 Encounters:  09/29/23 84         Passed - Valid encounter within last 6 months    Recent Outpatient Visits           1 month ago Type 2 diabetes mellitus without complication, without  long-term current use of insulin [facility])   [place] [facility] [place], Angeline ORN, NP   7 months ago Encounter for general adult medical examination with abnormal findings   [place] [facility] [place], Angeline ORN, NP

## 2023-12-11 NOTE — Telephone Encounter (Signed)
 Requested Prescriptions  Pending Prescriptions Disp Refills   carvedilol  (COREG ) 3.125 MG tablet 180 tablet 0    Sig: Take 1 tablet (3.125 mg total) by mouth 2 (two) times daily with a meal.     Cardiovascular: Beta Blockers 3 Passed - 12/11/2023 10:08 AM      Passed - Cr in normal range and within 360 days    Creat  Date Value Ref Range Status  11/04/2023 0.87 0.70 - 1.30 mg/dL Final   Creatinine, Urine  Date Value Ref Range Status  11/04/2023 89 20 - 320 mg/dL Final         Passed - AST in normal range and within 360 days    AST  Date Value Ref Range Status  11/04/2023 23 10 - 35 U/L Final         Passed - ALT in normal range and within 360 days    ALT  Date Value Ref Range Status  11/04/2023 31 9 - 46 U/L Final         Passed - Last BP in normal range    BP Readings from Last 1 Encounters:  11/04/23 134/84         Passed - Last Heart Rate in normal range    Pulse Readings from Last 1 Encounters:  09/29/23 84         Passed - Valid encounter within last 6 months    Recent Outpatient Visits           1 month ago Type 2 diabetes mellitus without complication, without long-term current use of insulin (HCC)   [facility], Angeline ORN, NP   7 months ago Encounter for general adult medical examination with abnormal findings   [place] [facility] [place], Angeline ORN, NP               amLODipine  (NORVASC ) 10 MG tablet 90 tablet 0    Sig: Take 1 tablet (10 mg total) by mouth daily. Please call our to schedule an overdue appointment with Dr. Hobart before anymore refills. [phone]. Thank you. 3rd attempt.     Cardiovascular: Calcium  Channel Blockers 2 Passed - 12/11/2023 10:08 AM      Passed - Last BP in normal range    BP Readings from Last 1 Encounters:  11/04/23 134/84         Passed - Last Heart Rate in normal range    Pulse Readings from Last 1 Encounters:  09/29/23 84         Passed - Valid  encounter within last 6 months    Recent Outpatient Visits           1 month ago Type 2 diabetes mellitus without complication, without long-term current use of insulin [facility])   [place] [facility] [place], [place], NP   7 months ago Encounter for general adult medical examination with abnormal findings    [facility] [place], Angeline ORN, NP               zolpidem  (AMBIEN  CR) 12.5 MG CR tablet 30 tablet 0    Sig: Take 1 tablet (12.5 mg total) by mouth at bedtime.     Not Delegated - Psychiatry:  Anxiolytics/Hypnotics Failed - 12/11/2023 10:08 AM      Failed - This refill cannot be delegated      Failed - Urine Drug Screen completed in last 360 days  Passed - Valid encounter within last 6 months    Recent Outpatient Visits           1 month ago Type 2 diabetes mellitus without complication, without long-term current use of insulin [facility])   [place] [facility] [place], Angeline ORN, NP   7 months ago Encounter for general adult medical examination with abnormal findings   [place] [facility] [place], Angeline ORN, NP               clonazePAM  (KLONOPIN ) 0.5 MG tablet 30 tablet 0    Sig: Take 1 tablet (0.5 mg total) by mouth at bedtime.     Not Delegated - Psychiatry: Anxiolytics/Hypnotics 2 Failed - 12/11/2023 10:08 AM      Failed - This refill cannot be delegated      Failed - Urine Drug Screen completed in last 360 days      Passed - Patient is not pregnant      Passed - Valid encounter within last 6 months    Recent Outpatient Visits           1 month ago Type 2 diabetes mellitus without complication, without long-term current use of insulin [facility])   [place] [facility] [place], Angeline ORN, NP   7 months ago Encounter for general adult medical examination with abnormal findings   [place] [facility] [place], Angeline ORN, NP

## 2023-12-13 ENCOUNTER — Other Ambulatory Visit: Payer: Self-pay

## 2023-12-13 MED ORDER — CLONAZEPAM 0.5 MG PO TABS
0.5000 mg | ORAL_TABLET | Freq: Every day | ORAL | 0 refills | Status: DC
  Filled 2023-12-13: qty 30, 30d supply, fill #0

## 2023-12-13 MED ORDER — ZOLPIDEM TARTRATE ER 12.5 MG PO TBCR
12.5000 mg | EXTENDED_RELEASE_TABLET | Freq: Every day | ORAL | 0 refills | Status: DC
  Filled 2023-12-13: qty 30, 30d supply, fill #0

## 2024-01-03 ENCOUNTER — Encounter: Payer: Self-pay | Admitting: Internal Medicine

## 2024-01-03 ENCOUNTER — Other Ambulatory Visit: Payer: Self-pay

## 2024-01-03 ENCOUNTER — Other Ambulatory Visit (HOSPITAL_COMMUNITY): Payer: Self-pay

## 2024-01-03 DIAGNOSIS — K219 Gastro-esophageal reflux disease without esophagitis: Secondary | ICD-10-CM

## 2024-01-03 MED ORDER — PANTOPRAZOLE SODIUM 40 MG PO TBEC
40.0000 mg | DELAYED_RELEASE_TABLET | Freq: Every day | ORAL | 0 refills | Status: AC
  Filled 2024-01-03 (×2): qty 90, 90d supply, fill #0

## 2024-01-07 ENCOUNTER — Ambulatory Visit (INDEPENDENT_AMBULATORY_CARE_PROVIDER_SITE_OTHER)

## 2024-01-07 ENCOUNTER — Ambulatory Visit
Admission: EM | Admit: 2024-01-07 | Discharge: 2024-01-07 | Disposition: A | Discharge status: Home / Self Care | Attending: Family Medicine | Admitting: Family Medicine

## 2024-01-07 ENCOUNTER — Ambulatory Visit: Payer: Self-pay | Admitting: Urgent Care

## 2024-01-07 ENCOUNTER — Other Ambulatory Visit: Payer: Self-pay

## 2024-01-07 ENCOUNTER — Other Ambulatory Visit: Payer: Self-pay | Admitting: Internal Medicine

## 2024-01-07 DIAGNOSIS — M25562 Pain in left knee: Secondary | ICD-10-CM | POA: Diagnosis not present

## 2024-01-07 DIAGNOSIS — M25462 Effusion, left knee: Secondary | ICD-10-CM | POA: Diagnosis not present

## 2024-01-07 MED ORDER — TRAMADOL HCL 50 MG PO TABS
50.0000 mg | ORAL_TABLET | Freq: Four times a day (QID) | ORAL | 0 refills | Status: AC | PRN
  Filled 2024-01-07: qty 15, 4d supply, fill #0

## 2024-01-07 NOTE — ED Provider Notes (Signed)
 [facility]  Note:  This document was prepared using Conservation officer, historic buildings and may include unintentional dictation errors.  MRN: [id number] DOB: [birth year]  Subjective:   ZAVIER CANELA is a 53 y.o. male presenting for 4 day history of left knee pain, swelling. No fall, trauma, redness, wounds. Has a history of arthritis of the left hip, had a hip replacement. Has a history of gastric ulcer, GI bleed, stroke, clotting disorder. Can only do APAP for pain.   No current facility-administered medications for this encounter.  Current Outpatient Medications:    albuterol  (VENTOLIN  HFA) 108 (90 Base) MCG/ACT inhaler, Inhale 1-2 puffs into the lungs every 4 (four) hours as needed for wheezing or shortness of breath, Disp: 6.7 g, Rfl: 2   amLODipine  (NORVASC ) 10 MG tablet, Take 1 tablet (10 mg total) by mouth daily. Please call our to schedule an overdue appointment with Dr. Hobart before anymore refills. [phone]. Thank you. 3rd attempt., Disp: 90 tablet, Rfl: 0   aspirin  EC 81 MG EC tablet, Take 1 tablet (81 mg total) by mouth daily., Disp: , Rfl:    carvedilol  (COREG ) 3.125 MG tablet, Take 1 tablet (3.125 mg total) by mouth 2 (two) times daily with a meal., Disp: 180 tablet, Rfl: 0   clonazePAM  (KLONOPIN ) 0.5 MG tablet, Take 1 tablet (0.5 mg total) by mouth at bedtime., Disp: 30 tablet, Rfl: 0   cyclobenzaprine  (FLEXERIL ) 10 MG tablet, Take 1 tablet (10 mg total) by mouth 2 (two) times daily as needed., Disp: 60 tablet, Rfl: 0   fluticasone  (FLONASE ) 50 MCG/ACT nasal spray, Place 2 sprays into both nostrils daily as needed for allergies., Disp: 16 g, Rfl: 11   gabapentin  (NEURONTIN ) 800 MG tablet, Take 1 tablet (800 mg total) by mouth 3 (three) times daily., Disp: 90 tablet, Rfl: 5   ipratropium (ATROVENT ) 0.03 % nasal spray, Place 2 sprays into both nostrils every 12 (twelve) hours., Disp: 30 mL, Rfl: 0   lisinopril  (ZESTRIL ) 10 MG tablet, Take 1 tablet  (10 mg total) by mouth daily., Disp: 90 tablet, Rfl: 0   ondansetron  (ZOFRAN ) 4 MG tablet, Take 1 tablet (4 mg total) by mouth every 8 (eight) hours as needed for nausea or vomiting., Disp: 20 tablet, Rfl: 1   pantoprazole  (PROTONIX ) 40 MG tablet, Take 1 tablet (40 mg total) by mouth daily., Disp: 90 tablet, Rfl: 0   rosuvastatin  (CRESTOR ) 5 MG tablet, Take 1 tablet (5 mg total) by mouth daily., Disp: 90 tablet, Rfl: 3   zolpidem  (AMBIEN  CR) 12.5 MG CR tablet, Take 1 tablet (12.5 mg total) by mouth at bedtime., Disp: 30 tablet, Rfl: 0   Allergies  Allergen Reactions   Dilaudid  [Hydromorphone  Hcl] Rash and Other (See Comments)    Shakey   Hydromorphone  Hcl Other (See Comments) and Rash    Shakey   Metoclopramide  Other (See Comments), Rash, Swelling and Hives    Swelling in lips   Metoclopramide  Hcl Hives, Swelling, Rash and Other (See Comments)    Swelling in lips   Nsaids     H/o bleeding gastric ulcers   Benadryl  [Diphenhydramine ]     Causes patient to become hyper and pace    Past Medical History:  Diagnosis Date   Anxiety 2018   takes Xanax  daily prn, panic attacks   Chronic gastric ulcer with bleeding s/p suture repair 2012 09/29/2010   Head 3 EGD which failed to stop the bleeding. Had exploratory laparotomy on 10/04/2010. Possible  cause of the gastric ulcer was NSAID use.    Clotting disorder 2012   Constipation    related to pain meds   Depression    Diabetes mellitus without complication (HCC)    Dizziness    occasionally   Dyspnea    walking a bit   Erosive esophagitis    External hemorrhoid, bleeding    Gastric ulcer    history of   GERD (gastroesophageal reflux disease) 2012   takes Protonix  bid   Hiatal hernia    History of blood clots 2012   in abdomen   History of blood transfusion 2012   Hyperlipidemia    Hypertension 2021   Incisional hernia s/p open repair w mesh Aug 2013 05/26/2011   Joint pain    knees   Nocturia    depends on amount of fluid he  drinks   Pneumonia 2018   Pre-diabetes    Primary localized osteoarthritis of left hip 04/24/2014   Serrated polyp of colon    Sleep apnea 2016   no cpap  mild no cpap needed   Stroke (HCC) 2019   Unstable angina (HCC) 04/09/2020   Upper GI bleed    Ventral hernia      Past Surgical History:  Procedure Laterality Date   ANTERIOR CERVICAL DECOMPRESSION/DISCECTOMY FUSION 4 LEVELS N/A 04/08/2018   Procedure: ACDF - C3-C4 - C4-C5 - C5-C6 - C6-C7;  Surgeon: Onetha Kuba, MD;  Location: [facility] OR;  Service: Neurosurgery;  Laterality: N/A;   CARPAL TUNNEL RELEASE Left 04/03/2018   COLONOSCOPY     COLONOSCOPY  07/15/2021   ESOPHAGEAL MANOMETRY N/A 10/19/2016   Procedure: ESOPHAGEAL MANOMETRY (EM);  Surgeon: Shila Gustav GAILS, MD;  Location: [facility];  Service: Endoscopy;  Laterality: N/A;   ESOPHAGOGASTRODUODENOSCOPY     ESOPHAGOGASTRODUODENOSCOPY N/A 01/22/2017   Procedure: ESOPHAGOGASTRODUODENOSCOPY (EGD);  Surgeon: Sheldon Standing, MD;  Location: [facility] ORS;  Service: General;  Laterality: N/A;   HERNIA REPAIR  2018   INCISIONAL HERNIA REPAIR  09/04/2011   Procedure: HERNIA REPAIR INCISIONAL;  Surgeon: Redell Faith, DO;  Location: [facility] OR;  Service: General;  Laterality: N/A;  debridment calcified mass   INSERTION OF MESH  09/04/2011   retrorectus ultrapro 30x30cm   INSERTION OF MESH N/A 01/22/2017   Procedure: INSERTION OF MESH;  Surgeon: Sheldon Standing, MD;  Location: [facility];  Service: General;  Laterality: N/A;   JOINT REPLACEMENT  2016   LEFT HEART CATH AND CORONARY ANGIOGRAPHY N/A 04/11/2020   Procedure: LEFT HEART CATH AND CORONARY ANGIOGRAPHY;  Surgeon: Court Dorn PARAS, MD;  Location: [facility] INV[facility];  Service: Cardiovascular;  Laterality: N/A;   NISSEN FUNDOPLICATION     Open 1990s   POLYPECTOMY     STOMACH SURGERY  10/05/2010   Oversewing of gastric ulcer.  Dr Vanderbilt   TOTAL HIP ARTHROPLASTY Left 04/24/2014   Procedure: LEFT TOTAL HIP ARTHROPLASTY;  Surgeon: Fonda Olmsted,  MD;  Location: [facility] OR;  Service: Orthopedics;  Laterality: Left;   UPPER GASTROINTESTINAL ENDOSCOPY      Family History  Problem Relation Age of Onset   Diabetes Mother    Liver disease Mother    Diabetes Father    Heart disease Father    Healthy Sister    Healthy Brother    Healthy Brother    Colon cancer Neg Hx    Stomach cancer Neg Hx    Rectal cancer Neg Hx    Esophageal cancer Neg Hx    Colon polyps  Neg Hx    Prostate cancer Neg Hx     Social History   Tobacco Use   Smoking status: Some Days   Smokeless tobacco: Current    Types: Chew   Tobacco comments:    occ-Chew    None on 07/15/21  Vaping Use   Vaping status: Never Used  Substance Use Topics   Alcohol use: Not Currently    Comment: 09/04/11 very seldom   Drug use: Never    ROS   Objective:   Vitals: BP 120/75 (BP Location: Left Arm)   Pulse 100   Temp 98 F (36.7 C) (Oral)   Resp 20   SpO2 94%   Physical Exam Constitutional:      General: He is not in acute distress.    Appearance: Normal appearance. He is well-developed and normal weight. He is not ill-appearing, toxic-appearing or diaphoretic.  HENT:     Head: Normocephalic and atraumatic.     Right Ear: External ear normal.     Left Ear: External ear normal.     Nose: Nose normal.     Mouth/Throat:     Pharynx: Oropharynx is clear.  Eyes:     General: No scleral icterus.       Right eye: No discharge.        Left eye: No discharge.     Extraocular Movements: Extraocular movements intact.  Cardiovascular:     Rate and Rhythm: Normal rate.  Pulmonary:     Effort: Pulmonary effort is normal.  Musculoskeletal:     Cervical back: Normal range of motion.     Left knee: Swelling (trace) and bony tenderness present. No deformity, effusion, erythema, ecchymosis, lacerations or crepitus. Normal range of motion. Tenderness present over the medial joint line, lateral joint line and patellar tendon. Normal alignment and normal patellar mobility.   Neurological:     Mental Status: He is alert and oriented to person, place, and time.  Psychiatric:        Mood and Affect: Mood normal.        Behavior: Behavior normal.        Thought Content: Thought content normal.        Judgment: Judgment normal.    Placed an Ace wrap on left knee.   Assessment and Plan :   PDMP not reviewed this encounter.  1. Acute pain of left knee    Suspect mild degenerative changes, possible joint effusion.  Overread is pending.  Must avoid NSAIDs.  Recommend tramadol , icing, general RICE method when possible.  Follow-up with his orthopedic practice as soon as possible.  Counseled patient on potential for adverse effects with medications prescribed/recommended today, ER and return-to-clinic precautions discussed, patient verbalized understanding.    Christopher Savannah, [place] 01/07/24 [id number]

## 2024-01-07 NOTE — ED Triage Notes (Signed)
 Pt reports pain and swelling in the left 3-4 days. Pt denies any injury. Pain is worse when pt try to bend the left leg. Tylenol  gives no relief.

## 2024-01-10 ENCOUNTER — Other Ambulatory Visit: Payer: Self-pay

## 2024-01-10 MED ORDER — CYCLOBENZAPRINE HCL 10 MG PO TABS
10.0000 mg | ORAL_TABLET | Freq: Two times a day (BID) | ORAL | 0 refills | Status: DC | PRN
  Filled 2024-01-10: qty 60, 30d supply, fill #0

## 2024-01-10 NOTE — Telephone Encounter (Signed)
 Requested medications are due for refill today.  yes  Requested medications are on the active medications list.  yes  Last refill. 12/08/2023 #60 0 rf  Future visit scheduled.   yes  Notes to clinic.  Refill not delegated.    Requested Prescriptions  Pending Prescriptions Disp Refills   cyclobenzaprine  (FLEXERIL ) 10 MG tablet 60 tablet 0    Sig: Take 1 tablet (10 mg total) by mouth 2 (two) times daily as needed.     Not Delegated - Analgesics:  Muscle Relaxants Failed - 01/10/2024  3:15 PM      Failed - This refill cannot be delegated      Passed - Valid encounter within last 6 months    Recent Outpatient Visits           2 months ago Type 2 diabetes mellitus without complication, without long-term current use of insulin [facility])   [facility] [place], Angeline ORN, NP   8 months ago Encounter for general adult medical examination with abnormal findings   [place] [place] [place], Angeline ORN, NP

## 2024-01-11 ENCOUNTER — Other Ambulatory Visit: Payer: Self-pay

## 2024-01-11 ENCOUNTER — Other Ambulatory Visit: Payer: Self-pay | Admitting: Internal Medicine

## 2024-01-11 DIAGNOSIS — F419 Anxiety disorder, unspecified: Secondary | ICD-10-CM

## 2024-01-11 DIAGNOSIS — F5101 Primary insomnia: Secondary | ICD-10-CM

## 2024-01-13 ENCOUNTER — Encounter: Payer: Self-pay | Admitting: Internal Medicine

## 2024-01-13 ENCOUNTER — Other Ambulatory Visit: Payer: Self-pay

## 2024-01-13 DIAGNOSIS — F419 Anxiety disorder, unspecified: Secondary | ICD-10-CM

## 2024-01-13 DIAGNOSIS — F5101 Primary insomnia: Secondary | ICD-10-CM

## 2024-01-13 MED ORDER — ZOLPIDEM TARTRATE ER 12.5 MG PO TBCR
12.5000 mg | EXTENDED_RELEASE_TABLET | Freq: Every day | ORAL | 0 refills | Status: DC
  Filled 2024-01-13: qty 30, 30d supply, fill #0

## 2024-01-13 MED ORDER — CLONAZEPAM 0.5 MG PO TABS
0.5000 mg | ORAL_TABLET | Freq: Every day | ORAL | 0 refills | Status: DC
  Filled 2024-01-13: qty 30, 30d supply, fill #0

## 2024-01-14 DIAGNOSIS — M79605 Pain in left leg: Secondary | ICD-10-CM | POA: Diagnosis not present

## 2024-01-14 DIAGNOSIS — M25562 Pain in left knee: Secondary | ICD-10-CM | POA: Diagnosis not present

## 2024-01-25 ENCOUNTER — Other Ambulatory Visit: Payer: Self-pay

## 2024-01-25 ENCOUNTER — Telehealth: Admitting: Family Medicine

## 2024-01-25 DIAGNOSIS — J069 Acute upper respiratory infection, unspecified: Secondary | ICD-10-CM | POA: Diagnosis not present

## 2024-01-25 MED ORDER — FLUTICASONE PROPIONATE 50 MCG/ACT NA SUSP
2.0000 | Freq: Every day | NASAL | 6 refills | Status: AC
  Filled 2024-01-25: qty 16, 30d supply, fill #0

## 2024-01-25 MED ORDER — LIDOCAINE VISCOUS HCL 2 % MT SOLN
15.0000 mL | OROMUCOSAL | 0 refills | Status: AC | PRN
  Filled 2024-01-25: qty 200, 5d supply, fill #0

## 2024-01-25 MED ORDER — PROMETHAZINE-DM 6.25-15 MG/5ML PO SYRP
5.0000 mL | ORAL_SOLUTION | Freq: Four times a day (QID) | ORAL | 0 refills | Status: AC | PRN
  Filled 2024-01-25: qty 118, 6d supply, fill #0

## 2024-01-25 NOTE — Progress Notes (Signed)

## 2024-01-26 ENCOUNTER — Other Ambulatory Visit: Payer: Self-pay

## 2024-02-01 ENCOUNTER — Other Ambulatory Visit: Payer: Self-pay

## 2024-02-10 ENCOUNTER — Other Ambulatory Visit: Payer: Self-pay

## 2024-02-10 ENCOUNTER — Other Ambulatory Visit: Payer: Self-pay | Admitting: Internal Medicine

## 2024-02-10 MED ORDER — LISINOPRIL 10 MG PO TABS
10.0000 mg | ORAL_TABLET | Freq: Every day | ORAL | 0 refills | Status: AC
  Filled 2024-02-10: qty 90, 90d supply, fill #0

## 2024-02-10 NOTE — Telephone Encounter (Signed)
 Requested medications are due for refill today.  yes  Requested medications are on the active medications list.  yes  Last refill. 01/10/2024  Future visit scheduled.   yes  Notes to clinic.  Refill not delegated.    Requested Prescriptions  Pending Prescriptions Disp Refills   cyclobenzaprine  (FLEXERIL ) 10 MG tablet 60 tablet 0    Sig: Take 1 tablet (10 mg total) by mouth 2 (two) times daily as needed.     Not Delegated - Analgesics:  Muscle Relaxants Failed - 02/10/2024  5:30 PM      Failed - This refill cannot be delegated      Passed - Valid encounter within last 6 months    Recent Outpatient Visits           3 months ago Type 2 diabetes mellitus without complication, without long-term current use of insulin [facility])   [facility] [place], Angeline ORN, NP   9 months ago Encounter for general adult medical examination with abnormal findings   [place] [facility] [place], Angeline ORN, NP              Signed Prescriptions Disp Refills   lisinopril  (ZESTRIL ) 10 MG tablet 90 tablet 0    Sig: Take 1 tablet (10 mg total) by mouth daily.     Cardiovascular:  ACE Inhibitors Passed - 02/10/2024  5:30 PM      Passed - Cr in normal range and within 180 days    Creat  Date Value Ref Range Status  11/04/2023 0.87 0.70 - 1.30 mg/dL Final   Creatinine, Urine  Date Value Ref Range Status  11/04/2023 89 20 - 320 mg/dL Final         Passed - K in normal range and within 180 days    Potassium  Date Value Ref Range Status  11/04/2023 5.3 3.5 - 5.3 mmol/L Final         Passed - Patient is not pregnant      Passed - Last BP in normal range    BP Readings from Last 1 Encounters:  01/07/24 120/75         Passed - Valid encounter within last 6 months    Recent Outpatient Visits           3 months ago Type 2 diabetes mellitus without complication, without long-term current use of insulin [facility])   [place] [facility]  [place], Angeline ORN, NP   9 months ago Encounter for general adult medical examination with abnormal findings   [facility] [place], Angeline ORN, NP

## 2024-02-10 NOTE — Telephone Encounter (Signed)
 Requested Prescriptions  Pending Prescriptions Disp Refills   lisinopril  (ZESTRIL ) 10 MG tablet 90 tablet 0    Sig: Take 1 tablet (10 mg total) by mouth daily.     Cardiovascular:  ACE Inhibitors Passed - 02/10/2024  5:29 PM      Passed - Cr in normal range and within 180 days    Creat  Date Value Ref Range Status  11/04/2023 0.87 0.70 - 1.30 mg/dL Final   Creatinine, Urine  Date Value Ref Range Status  11/04/2023 89 20 - 320 mg/dL Final         Passed - K in normal range and within 180 days    Potassium  Date Value Ref Range Status  11/04/2023 5.3 3.5 - 5.3 mmol/L Final         Passed - Patient is not pregnant      Passed - Last BP in normal range    BP Readings from Last 1 Encounters:  01/07/24 120/75         Passed - Valid encounter within last 6 months    Recent Outpatient Visits           3 months ago Type 2 diabetes mellitus without complication, without long-term current use of insulin [facility])   [place] [place], [place], NP   9 months ago Encounter for general adult medical examination with abnormal findings   [facility] [place], Angeline ORN, NP               cyclobenzaprine  (FLEXERIL ) 10 MG tablet 60 tablet 0    Sig: Take 1 tablet (10 mg total) by mouth 2 (two) times daily as needed.     Not Delegated - Analgesics:  Muscle Relaxants Failed - 02/10/2024  5:29 PM      Failed - This refill cannot be delegated      Passed - Valid encounter within last 6 months    Recent Outpatient Visits           3 months ago Type 2 diabetes mellitus without complication, without long-term current use of insulin [facility])   [place] [facility] [place], Angeline ORN, NP   9 months ago Encounter for general adult medical examination with abnormal findings    [facility] [place], Angeline ORN, NP

## 2024-02-11 ENCOUNTER — Other Ambulatory Visit: Payer: Self-pay

## 2024-02-11 MED ORDER — CYCLOBENZAPRINE HCL 10 MG PO TABS
10.0000 mg | ORAL_TABLET | Freq: Two times a day (BID) | ORAL | 0 refills | Status: AC | PRN
  Filled 2024-02-11: qty 60, 30d supply, fill #0

## 2024-02-14 ENCOUNTER — Other Ambulatory Visit: Payer: Self-pay

## 2024-02-14 ENCOUNTER — Other Ambulatory Visit: Payer: Self-pay | Admitting: Internal Medicine

## 2024-02-14 DIAGNOSIS — F419 Anxiety disorder, unspecified: Secondary | ICD-10-CM

## 2024-02-14 DIAGNOSIS — F5101 Primary insomnia: Secondary | ICD-10-CM

## 2024-02-15 ENCOUNTER — Other Ambulatory Visit: Payer: Self-pay

## 2024-02-15 MED ORDER — CLONAZEPAM 0.5 MG PO TABS
0.5000 mg | ORAL_TABLET | Freq: Every day | ORAL | 0 refills | Status: AC
  Filled 2024-02-15: qty 30, 30d supply, fill #0

## 2024-02-15 MED ORDER — ZOLPIDEM TARTRATE ER 12.5 MG PO TBCR
12.5000 mg | EXTENDED_RELEASE_TABLET | Freq: Every day | ORAL | 0 refills | Status: AC
  Filled 2024-02-15: qty 30, 30d supply, fill #0

## 2024-02-15 NOTE — Telephone Encounter (Signed)
 Requested medication (s) are due for refill today: yes  Requested medication (s) are on the active medication list: yes  Last refill:  01/13/24  Future visit scheduled: yes  Notes to clinic:  Unable to refill per protocol, cannot delegate.      Requested Prescriptions  Pending Prescriptions Disp Refills   clonazePAM  (KLONOPIN ) 0.5 MG tablet 30 tablet 0    Sig: Take 1 tablet (0.5 mg total) by mouth at bedtime.     Not Delegated - Psychiatry: Anxiolytics/Hypnotics 2 Failed - 02/15/2024 11:22 AM      Failed - This refill cannot be delegated      Failed - Urine Drug Screen completed in last 360 days      Passed - Patient is not pregnant      Passed - Valid encounter within last 6 months    Recent Outpatient Visits           3 months ago Type 2 diabetes mellitus without complication, without long-term current use of insulin [facility])   [place] [facility] [place], [place], NP   9 months ago Encounter for general adult medical examination with abnormal findings   [place] [facility] [place], Angeline ORN, NP               zolpidem  (AMBIEN  CR) 12.5 MG CR tablet 30 tablet 0    Sig: Take 1 tablet (12.5 mg total) by mouth at bedtime.     Not Delegated - Psychiatry:  Anxiolytics/Hypnotics Failed - 02/15/2024 11:22 AM      Failed - This refill cannot be delegated      Failed - Urine Drug Screen completed in last 360 days      Passed - Valid encounter within last 6 months    Recent Outpatient Visits           3 months ago Type 2 diabetes mellitus without complication, without long-term current use of insulin [facility])   [place] [facility] [place], Angeline ORN, NP   9 months ago Encounter for general adult medical examination with abnormal findings   [place] [facility] [place], Angeline ORN, NP

## 2024-03-03 ENCOUNTER — Other Ambulatory Visit: Payer: Self-pay

## 2024-03-03 ENCOUNTER — Telehealth: Admitting: Nurse Practitioner

## 2024-03-03 DIAGNOSIS — J069 Acute upper respiratory infection, unspecified: Secondary | ICD-10-CM

## 2024-03-03 NOTE — Progress Notes (Unsigned)
 Message sent to patient requesting further input regarding current symptoms. Awaiting patient response.

## 2024-03-08 ENCOUNTER — Other Ambulatory Visit (HOSPITAL_BASED_OUTPATIENT_CLINIC_OR_DEPARTMENT_OTHER): Payer: Self-pay

## 2024-03-08 ENCOUNTER — Ambulatory Visit
Admission: RE | Admit: 2024-03-08 | Discharge: 2024-03-08 | Disposition: A | Discharge status: Home / Self Care | Source: Ambulatory Visit

## 2024-03-08 VITALS — BP 111/78 | HR 110 | Temp 98.4°F | Resp 18

## 2024-03-08 DIAGNOSIS — J329 Chronic sinusitis, unspecified: Secondary | ICD-10-CM

## 2024-03-08 DIAGNOSIS — R52 Pain, unspecified: Secondary | ICD-10-CM | POA: Diagnosis not present

## 2024-03-08 DIAGNOSIS — J4530 Mild persistent asthma, uncomplicated: Secondary | ICD-10-CM | POA: Diagnosis not present

## 2024-03-08 DIAGNOSIS — J40 Bronchitis, not specified as acute or chronic: Secondary | ICD-10-CM

## 2024-03-08 MED ORDER — DEXAMETHASONE SOD PHOSPHATE PF 10 MG/ML IJ SOLN
10.0000 mg | Freq: Once | INTRAMUSCULAR | Status: AC
  Administered 2024-03-08: 10 mg via INTRAMUSCULAR

## 2024-03-08 MED ORDER — PROMETHAZINE-DM 6.25-15 MG/5ML PO SYRP
5.0000 mL | ORAL_SOLUTION | Freq: Three times a day (TID) | ORAL | 0 refills | Status: AC | PRN
  Filled 2024-03-08: qty 200, 14d supply, fill #0

## 2024-03-08 MED ORDER — AMOXICILLIN-POT CLAVULANATE 875-125 MG PO TABS
1.0000 | ORAL_TABLET | Freq: Two times a day (BID) | ORAL | 0 refills | Status: AC
  Filled 2024-03-08: qty 20, 10d supply, fill #0

## 2024-03-08 NOTE — Discharge Instructions (Addendum)
 We are managing you for sinobronchitis with the steroid injection dexamethasone  which you have tolerated in the past as well as Augmentin  as an antibiotic. Use your albuterol  inhaler and cough syrup as needed.

## 2024-03-08 NOTE — ED Provider Notes (Signed)
 " [facility], Television/film[employer]  Note:  This document was prepared using Conservation officer, historic buildings and may include unintentional dictation errors.  MRN: [id number] DOB: [birth year]  Subjective:   [name] is a 54 y.o. male presenting for 1 week history of persistent and worsening sinus congestion, sinus drainage, productive cough, chest pain, shob.  Has an inhaler that he can use at home, does not need a refill. No smoking of any kind including cigarettes, cigars, vaping, marijuana use.  Had a history of Niesen fundoplication, gastric bypass.  Has previously tolerated dexamethasone  but not prednisone .  Of note, patient had an e-visit last week and was recommended supportive care.  He feels he is worsening.  Current Outpatient Medications  Medication Instructions   albuterol  (VENTOLIN  HFA) 108 (90 Base) MCG/ACT inhaler Inhale 1-2 puffs into the lungs every 4 (four) hours as needed for wheezing or shortness of breath   amLODipine  (NORVASC ) 10 mg, Oral, Daily, Please call our to schedule an overdue appointment with Dr. Hobart before anymore refills. [phone]. Thank you. 3rd attempt.   aspirin  EC 81 mg, Oral, Daily   carvedilol  (COREG ) 3.125 mg, Oral, 2 times daily with meals   clonazePAM  (KLONOPIN ) 0.5 mg, Oral, Daily at bedtime   cyclobenzaprine  (FLEXERIL ) 10 mg, Oral, 2 times daily PRN   fluticasone  (FLONASE ) 50 MCG/ACT nasal spray 2 sprays, Each Nare, Daily PRN   fluticasone  (FLONASE ) 50 MCG/ACT nasal spray 2 sprays, Each Nare, Daily   gabapentin  (NEURONTIN ) 800 mg, Oral, 3 times daily   ipratropium (ATROVENT ) 0.03 % nasal spray 2 sprays, Each Nare, Every 12 hours   lisinopril  (ZESTRIL ) 10 mg, Oral, Daily   ondansetron  (ZOFRAN ) 4 MG tablet Take 1 tablet (4 mg total) by mouth every 8 (eight) hours as needed for nausea or vomiting.   pantoprazole  (PROTONIX ) 40 mg, Oral, Daily   rosuvastatin  (CRESTOR ) 5 mg, Oral, Daily   traMADol  (ULTRAM ) 50 mg, Oral, Every 6 hours PRN    zolpidem  (AMBIEN  CR) 12.5 mg, Oral, Daily at bedtime    Allergies[1]  Past Medical History:  Diagnosis Date   Anxiety 2018   takes Xanax  daily prn, panic attacks   Chronic gastric ulcer with bleeding s/p suture repair 2012 09/29/2010   Head 3 EGD which failed to stop the bleeding. Had exploratory laparotomy on 10/04/2010. Possible cause of the gastric ulcer was NSAID use.    Clotting disorder 2012   Constipation    related to pain meds   Depression    Diabetes mellitus without complication (HCC)    Dizziness    occasionally   Dyspnea    walking a bit   Erosive esophagitis    External hemorrhoid, bleeding    Gastric ulcer    history of   GERD (gastroesophageal reflux disease) 2012   takes Protonix  bid   Hiatal hernia    History of blood clots 2012   in abdomen   History of blood transfusion 2012   Hyperlipidemia    Hypertension 2021   Incisional hernia s/p open repair w mesh Aug 2013 05/26/2011   Joint pain    knees   Nocturia    depends on amount of fluid he drinks   Pneumonia 2018   Pre-diabetes    Primary localized osteoarthritis of left hip 04/24/2014   Serrated polyp of colon    Sleep apnea 2016   no cpap  mild no cpap needed   Stroke (HCC) 2019   Unstable angina (HCC) 04/09/2020  Upper GI bleed    Ventral hernia      Past Surgical History:  Procedure Laterality Date   ANTERIOR CERVICAL DECOMPRESSION/DISCECTOMY FUSION 4 LEVELS N/A 04/08/2018   Procedure: ACDF - C3-C4 - C4-C5 - C5-C6 - C6-C7;  Surgeon: Onetha Kuba, MD;  Location: [facility] OR;  Service: Neurosurgery;  Laterality: N/A;   CARPAL TUNNEL RELEASE Left 04/03/2018   COLONOSCOPY     COLONOSCOPY  07/15/2021   ESOPHAGEAL MANOMETRY N/A 10/19/2016   Procedure: ESOPHAGEAL MANOMETRY (EM);  Surgeon: Shila Gustav GAILS, MD;  Location: [facility] END[facility]COPY;  Service: Endoscopy;  Laterality: N/A;   ESOPHAGOGASTRODUODENOSCOPY     ESOPHAGOGASTRODUODENOSCOPY N/A 01/22/2017   Procedure: ESOPHAGOGASTRODUODENOSCOPY (EGD);   Surgeon: Sheldon Standing, MD;  Location: [facility] ORS;  Service: General;  Laterality: N/A;   HERNIA REPAIR  2018   INCISIONAL HERNIA REPAIR  09/04/2011   Procedure: HERNIA REPAIR INCISIONAL;  Surgeon: Redell Faith, DO;  Location: [facility] OR;  Service: General;  Laterality: N/A;  debridment calcified mass   INSERTION OF MESH  09/04/2011   retrorectus ultrapro 30x30cm   INSERTION OF MESH N/A 01/22/2017   Procedure: INSERTION OF MESH;  Surgeon: Sheldon Standing, MD;  Location: [facility] ORS;  Service: General;  Laterality: N/A;   JOINT REPLACEMENT  2016   LEFT HEART CATH AND CORONARY ANGIOGRAPHY N/A 04/11/2020   Procedure: LEFT HEART CATH AND CORONARY ANGIOGRAPHY;  Surgeon: Court Dorn PARAS, MD;  Location: [facility];  Service: Cardiovascular;  Laterality: N/A;   NISSEN FUNDOPLICATION     Open 1990s   POLYPECTOMY     STOMACH SURGERY  10/05/2010   Oversewing of gastric ulcer.  Dr Vanderbilt   TOTAL HIP ARTHROPLASTY Left 04/24/2014   Procedure: LEFT TOTAL HIP ARTHROPLASTY;  Surgeon: Fonda Olmsted, MD;  Location: [facility] OR;  Service: Orthopedics;  Laterality: Left;   UPPER GASTROINTESTINAL ENDOSCOPY      Family History  Problem Relation Age of Onset   Diabetes Mother    Liver disease Mother    Diabetes Father    Heart disease Father    Healthy Sister    Healthy Brother    Healthy Brother    Colon cancer Neg Hx    Stomach cancer Neg Hx    Rectal cancer Neg Hx    Esophageal cancer Neg Hx    Colon polyps Neg Hx    Prostate cancer Neg Hx     Social History   Occupational History   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    Appearance: Normal appearance. He is well-developed and normal weight. He is not ill-appearing, toxic-appearing or diaphoretic.  HENT:     Head: Normocephalic and atraumatic.     Right Ear: Tympanic membrane, ear canal and external ear  normal. No drainage, swelling or tenderness. No middle ear effusion. There is no impacted cerumen. Tympanic membrane is not erythematous or bulging.     Left Ear: Tympanic membrane, ear canal and external ear normal. No drainage, swelling or tenderness.  No middle ear effusion. There is no impacted cerumen. Tympanic membrane is not erythematous or bulging.     Nose: Congestion present. No rhinorrhea.     Mouth/Throat:     Mouth: Mucous membranes are moist.     Pharynx: No oropharyngeal exudate or posterior oropharyngeal erythema.     Comments: Thick streaks of postnasal drainage overlying pharynx.  Eyes:     General: No scleral icterus.       Right eye: No discharge.        Left eye: No discharge.     Extraocular Movements: Extraocular movements intact.     Conjunctiva/sclera: Conjunctivae normal.  Cardiovascular:     Rate and Rhythm: Normal rate and regular rhythm.     Heart sounds: Normal heart sounds. No murmur heard.    No friction rub. No gallop.  Pulmonary:     Effort: Pulmonary effort is normal. No respiratory distress.     Breath sounds: No stridor. Rhonchi (trace bilaterally over mid lung fields) present. No wheezing or rales.  Musculoskeletal:     Cervical back: Normal range of motion and neck supple. No rigidity. No muscular tenderness.  Neurological:     General: No focal deficit present.     Mental Status: He is alert and oriented to person, place, and time.  Psychiatric:        Mood and Affect: Mood normal.        Behavior: Behavior normal.         Thought Content: Thought content normal.    IM dexamethasone  10 mg administered in clinic.  Assessment and Plan :   PDMP not reviewed this encounter.  1. Sinobronchitis   2. Body aches   3. Mild persistent reactive airway disease without complication      IM dexamethasone  as above, start Augmentin .  Continue using inhaler as needed as well as cough syrup.  Will defer imaging for now.  Counseled patient on potential for adverse effects with medications prescribed/recommended today, ER and return-to-clinic precautions discussed, patient verbalized understanding.     [1]  Allergies Allergen Reactions   Dilaudid  [Hydromorphone  Hcl] Rash and Other (See Comments)    Shakey   Hydromorphone  Hcl Other (See Comments) and Rash    Shakey   Metoclopramide  Other (See Comments), Rash, Swelling and Hives    Swelling in lips   Metoclopramide  Hcl Hives, Swelling, Rash and Other (See Comments)    Swelling in lips   Nsaids     H/o bleeding gastric ulcers   Benadryl  [Diphenhydramine ]     Causes patient to become hyper and pace     Christopher Savannah, [place] 03/08/24 1102  "

## 2024-03-08 NOTE — ED Triage Notes (Signed)
 Pt c/o prod cough x 1 week-did an evisit last week- with OTC meds-states today he could not smell and has nasal congestion-NAD-steady gait

## 2024-05-03 ENCOUNTER — Encounter: Admitting: Internal Medicine

## 2024-09-05 ENCOUNTER — Ambulatory Visit: Admitting: Neurology
